# Patient Record
Sex: Female | Born: 1955
Health system: Southern US, Community
[De-identification: ages and names within clinical notes are randomized; demographics above are authoritative.]

## PROBLEM LIST (undated history)

## (undated) DIAGNOSIS — Z9889 Other specified postprocedural states: Secondary | ICD-10-CM

## (undated) DIAGNOSIS — T7840XA Allergy, unspecified, initial encounter: Secondary | ICD-10-CM

## (undated) DIAGNOSIS — R112 Nausea with vomiting, unspecified: Secondary | ICD-10-CM

## (undated) DIAGNOSIS — F419 Anxiety disorder, unspecified: Secondary | ICD-10-CM

## (undated) DIAGNOSIS — N3020 Other chronic cystitis without hematuria: Secondary | ICD-10-CM

## (undated) DIAGNOSIS — F329 Major depressive disorder, single episode, unspecified: Secondary | ICD-10-CM

## (undated) DIAGNOSIS — Z5189 Encounter for other specified aftercare: Secondary | ICD-10-CM

## (undated) DIAGNOSIS — E119 Type 2 diabetes mellitus without complications: Secondary | ICD-10-CM

## (undated) DIAGNOSIS — M1990 Unspecified osteoarthritis, unspecified site: Secondary | ICD-10-CM

## (undated) DIAGNOSIS — I10 Essential (primary) hypertension: Secondary | ICD-10-CM

## (undated) DIAGNOSIS — R7303 Prediabetes: Secondary | ICD-10-CM

## (undated) DIAGNOSIS — D649 Anemia, unspecified: Secondary | ICD-10-CM

## (undated) DIAGNOSIS — K259 Gastric ulcer, unspecified as acute or chronic, without hemorrhage or perforation: Secondary | ICD-10-CM

## (undated) DIAGNOSIS — Z860100 Personal history of colon polyps, unspecified: Secondary | ICD-10-CM

## (undated) DIAGNOSIS — G43909 Migraine, unspecified, not intractable, without status migrainosus: Secondary | ICD-10-CM

## (undated) DIAGNOSIS — E669 Obesity, unspecified: Secondary | ICD-10-CM

## (undated) DIAGNOSIS — J45909 Unspecified asthma, uncomplicated: Secondary | ICD-10-CM

## (undated) DIAGNOSIS — D749 Methemoglobinemia, unspecified: Secondary | ICD-10-CM

## (undated) DIAGNOSIS — K219 Gastro-esophageal reflux disease without esophagitis: Secondary | ICD-10-CM

## (undated) DIAGNOSIS — E785 Hyperlipidemia, unspecified: Secondary | ICD-10-CM

## (undated) DIAGNOSIS — F32A Depression, unspecified: Secondary | ICD-10-CM

## (undated) DIAGNOSIS — J189 Pneumonia, unspecified organism: Secondary | ICD-10-CM

## (undated) HISTORY — DX: Major depressive disorder, single episode, unspecified: F32.9

## (undated) HISTORY — DX: Essential (primary) hypertension: I10

## (undated) HISTORY — DX: Unspecified asthma, uncomplicated: J45.909

## (undated) HISTORY — DX: Gastro-esophageal reflux disease without esophagitis: K21.9

## (undated) HISTORY — DX: Personal history of colonic polyps: Z86.010

## (undated) HISTORY — DX: Unspecified osteoarthritis, unspecified site: M19.90

## (undated) HISTORY — DX: Personal history of colon polyps, unspecified: Z86.0100

## (undated) HISTORY — DX: Allergy, unspecified, initial encounter: T78.40XA

## (undated) HISTORY — DX: Other chronic cystitis without hematuria: N30.20

## (undated) HISTORY — DX: Obesity, unspecified: E66.9

## (undated) HISTORY — PX: NASAL SEPTUM SURGERY: SHX37

## (undated) HISTORY — DX: Methemoglobinemia, unspecified: D74.9

## (undated) HISTORY — PX: APPENDECTOMY: SHX54

## (undated) HISTORY — DX: Depression, unspecified: F32.A

## (undated) HISTORY — DX: Anxiety disorder, unspecified: F41.9

## (undated) HISTORY — PX: NECK SURGERY: SHX720

## (undated) HISTORY — PX: TONSILLECTOMY AND ADENOIDECTOMY: SUR1326

## (undated) HISTORY — DX: Anemia, unspecified: D64.9

## (undated) HISTORY — DX: Type 2 diabetes mellitus without complications: E11.9

## (undated) HISTORY — DX: Encounter for other specified aftercare: Z51.89

## (undated) HISTORY — PX: SPINE SURGERY: SHX786

## (undated) HISTORY — DX: Migraine, unspecified, not intractable, without status migrainosus: G43.909

## (undated) HISTORY — PX: SIGMOID RESECTION / RECTOPEXY: SUR1294

## (undated) HISTORY — PX: HIP ARTHROSCOPY: SHX668

## (undated) HISTORY — PX: COLON SURGERY: SHX602

## (undated) HISTORY — DX: Gastric ulcer, unspecified as acute or chronic, without hemorrhage or perforation: K25.9

## (undated) HISTORY — DX: Hyperlipidemia, unspecified: E78.5

---

## 1994-01-25 HISTORY — PX: ABDOMINAL HYSTERECTOMY: SHX81

## 1999-01-26 HISTORY — PX: BILATERAL OOPHORECTOMY: SHX1221

## 2004-03-23 ENCOUNTER — Ambulatory Visit: Payer: Self-pay | Admitting: Urology

## 2004-12-02 ENCOUNTER — Ambulatory Visit: Payer: Self-pay | Admitting: Otolaryngology

## 2006-02-25 ENCOUNTER — Ambulatory Visit: Payer: Self-pay | Admitting: Unknown Physician Specialty

## 2006-05-16 ENCOUNTER — Ambulatory Visit (HOSPITAL_COMMUNITY): Admission: RE | Admit: 2006-05-16 | Discharge: 2006-05-16 | Payer: Self-pay | Admitting: Neurosurgery

## 2006-07-13 ENCOUNTER — Encounter: Admission: RE | Admit: 2006-07-13 | Discharge: 2006-07-13 | Payer: Self-pay | Admitting: Neurosurgery

## 2006-12-12 ENCOUNTER — Ambulatory Visit: Payer: Self-pay | Admitting: Unknown Physician Specialty

## 2007-01-26 LAB — HM COLONOSCOPY

## 2007-04-24 ENCOUNTER — Ambulatory Visit: Payer: Self-pay | Admitting: Family Medicine

## 2007-05-03 ENCOUNTER — Ambulatory Visit: Payer: Self-pay | Admitting: Unknown Physician Specialty

## 2007-06-14 ENCOUNTER — Ambulatory Visit: Payer: Self-pay | Admitting: Otolaryngology

## 2009-11-04 ENCOUNTER — Ambulatory Visit: Payer: Self-pay | Admitting: Nurse Practitioner

## 2009-11-26 ENCOUNTER — Ambulatory Visit: Payer: Self-pay | Admitting: Family Medicine

## 2010-04-14 ENCOUNTER — Encounter: Payer: Self-pay | Admitting: Nurse Practitioner

## 2010-05-05 ENCOUNTER — Encounter (INDEPENDENT_AMBULATORY_CARE_PROVIDER_SITE_OTHER): Payer: 59 | Admitting: Nurse Practitioner

## 2010-05-05 DIAGNOSIS — G43109 Migraine with aura, not intractable, without status migrainosus: Secondary | ICD-10-CM

## 2010-05-06 NOTE — Assessment & Plan Note (Unsigned)
NAME:  Bethany Scott, Bethany Scott               ACCOUNT NO.:  [id number]  MEDICAL RECORD NO.:  [id number]           PATIENT TYPE:  LOCATION:  [facility] at [facility]           FACILITY:  PHYSICIAN:  Jaynie Collins, MD     DATE OF BIRTH:  Jul 28, 1955  DATE OF SERVICE:  05/05/2010                                 CLINIC NOTE  HISTORY OF PRESENT ILLNESS:  The patient comes to the office today for followup on her migraine headaches.  The patient was last seen 6 months ago at that time we did trigger point injections, refilled her medications, and she was doing relatively well.  She had 5 severe headaches and 5 moderate headaches at that time.  She has had Vicodin for headache rescue.  Since that time, she has also called in several times for refill on her Vicodin.  The patient has been forced to take Tylenol due to history of stomach ulcer with anti-inflammatories.  She is currently taking Dexilant for her stomach issues.  She is aware that Vicodin is addictive and does not want to have any issues with that medication and is willing to reformulate a plan.  Today, we talked about reformulating her headache management.  ALLERGIES:  PENICILLIN, CEPHALOSPORINS.  PHYSICAL EXAMINATION:  VITAL SIGNS:  Blood pressure is 133/91, pulse is 109, weight 240. GENERAL:  Well-developed, well-nourished 55 year old Caucasian female in no acute distress. HEENT:  Head is normocephalic and atraumatic.  Pupils are equal and reactive.  Her sclerae are somewhat reddened today secondary to allergies. NEUROLOGICALLY:  Alert and oriented.  Good coordination, good muscle tone, good sensation.  ASSESSMENT:  Migraine.  PLAN:  The patient does have stomach ulcers.  She is not going to be able to take anti-inflammatories.  We have decided to go up on her headache preventative which is the Effexor.  She was taking 150, will go up to 75 mg t.i.d.  She is given a prescription for #90 with 6 refills. She does have a lot of tension  in her neck.  We will try Zanaflex 4 mg at bedtime p.r.n. #30 with 3 refills and in lieu of the Vicodin, we will try Tylenol No. 3 one or two p.o. q.4 p.r.n. #30 with one refill.  The patient will follow up in 6 months or sooner.  She can certainly call the office any time.  She is having an issue and if this plan does not work, she is advised to call back.     Remonia Richter, NP   ______________________________ Jaynie Collins, MD   LR/MEDQ  D:  05/05/2010  T:  05/06/2010  Job:  [id number]

## 2010-06-09 NOTE — Assessment & Plan Note (Signed)
NAME:  Bethany Scott, Bethany Scott               ACCOUNT NO.:  [id number]   MEDICAL RECORD NO.:  [id number]          PATIENT TYPE:  POB   LOCATION:  [facility] at [facility]         FACILITY:  [facility]   PHYSICIAN:  Allie Bossier, MD        DATE OF BIRTH:  07-14-1955   DATE OF SERVICE:  11/04/2009                                  CLINIC NOTE   Patient comes to office today for a new consultation on migraine  headaches.  The patient has had migraine headaches for approximately the  last 17 years.  She does have an occasional migraines.  She is known to  [facility] from [facility].  Her headaches are mostly located  on her right or left temple.  In the last one month, she has had  approximately 5 severe and 5 moderate with no mild.  She is currently  taking Effexor, Maxalt, Phenergan, and Excedrin.  She had been seeing  her gynecologist to get her medications refilled, where gynecologist has  retired.   She has past medical history of asthma, elevated cholesterol, and  allergies.   SOCIAL HISTORY:  She is employed.  She is married with 2 children, one  married, one at home.  She does not smoke.  She drinks alcohol rarely.   Family history of diabetes and high blood pressure.   SYSTEMS REVIEW:  Positive for headache and easy bruising.  Negative for  weight gain or weight loss, depression, anxiety, cough, muscle bone  pain, abdominal pain, blood in stool or vaginal discharge.   PHYSICAL EXAMINATION:  GENERAL:  Well developed, well nourished 54-year-  old Caucasian female in no acute distress.  HEENT:  Head is normocephalic and atraumatic.  Pupils equal and react.  Patient is tender in her temples bilaterally as well as occipital  regions bilaterally.  CARDIAC:  Regular rate and rhythm.  LUNGS:  Clear bilaterally.   PROCEDURE:  Please see procedure note for trigger point injections.   ASSESSMENT:  Migraine headache.   PLAN:  We will do trigger point injections today per patient's request.  We will also refill her medications.  She has Effexor 150 mg 1 p.o.  daily #30 with 11 refills, Maxalt 10 mg 1 p.r.n. migraine #12 with  p.r.n. refills, Phenergan, #30 one q.6 hours p.r.n. #30 with 1 refill,  Norflex 100 mg 1 q.8 hours #30 with 3 refills.  She will rescue with  Vicodin ES one p.o. q.4 hours p.r.n. pain #30 with no refills.  She will  return to this clinic in 6 months or sooner as need be.      Bethany Richter, NP    ______________________________  Allie Bossier, MD    LR/MEDQ  D:  11/04/2009  T:  11/05/2009  Job:  [id number]

## 2010-06-10 ENCOUNTER — Ambulatory Visit: Payer: Self-pay | Admitting: Family Medicine

## 2010-06-12 NOTE — Op Note (Signed)
NAME:  Bethany Scott, Bethany Scott               ACCOUNT NO.:  [id number]   MEDICAL RECORD NO.:  [id number]          PATIENT TYPE:  AMB   LOCATION:  [facility]                          FACILITY:  [facility]   PHYSICIAN:  Henry A. Pool, M.D.    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   PROCEDURE NAME:  C4-5 and C5-6 anterior cervical diskectomy and fusion.   SURGEON:  Kathaleen Maser. Pool, M.D.   ASSISTANT:  Donalee Citrin, M.D.   ANESTHESIA:  General endotracheal.   INDICATIONS:  Bethany Scott is a 55 year old female with history of neck and  right upper extremity pain, paresthesias, and weakness, consistent with  a right C5 and C6 radiculopathy.  Workups demonstrated evidence of  significant spondylosis off of the right side of C4-5, as well as  rightward C5-6 spondylosis, with an element of a right-sided C5-6 disk  herniation.  The patient has failed conservative management and has  decided to proceed with a C4-5 and C5-6 anterior cervical diskectomy  fusion with allograft and plating.   OPERATIVE NOTE:  The patient was brought to the operating room and  placed on the operating table in a supine position.  After an adequate  level of anesthesia was achieved, the patient was positioned supine with  neck slightly extended and held in place with halter traction.  The  patient's anterior cervical region was prepped and draped sterilely.  A  10 blade was used to make a linear skin incision overlying the C5  vertebral level.  This was carried down sharply to the platysma.  The  platysma was then divided vertically, and dissection proceeded along the  medial border of the sternomastoid muscle and carotid sheath.  Trachea  and esophagus were mobilized and retracted  toward the left.  Prevertebral fascia was stripped off the anterior spinal column.  The  longus coli muscles were elevated bilaterally using electrocautery.  A  deep self-retaining retractor was placed.  Intraoperative fluoroscopy  was used, and the C4-5 and C5-6 levels were confirmed.  Disc space then  incised with a 15 blade in a rectangular fashion.  A wide disc space  cleanout was then achieved using pituitary rongeurs, forward and  backbiting curets, Kerrison rongeurs, and high-speed drill.  All  elements of the disc were removed down to the level of the posterior  annulus.  The microscope was brought into the field and used throughout  the remainder of the diskectomy.  Remaining aspects of the annulus and  osteophytes were removed using the high-speed drill down to the level of  the posterior longitudinal ligament.  The posterior longitudinal  ligament was then elevated and resected in a piecemeal fashion using  Kerrison rongeurs.  The underlying thecal sac was then identified.  A  wide sector decompression was then performed by undercutting the bodies  of C4 and C5.  Decompression then proceeded out into each neural  foramen.  Wide anterior foraminotomies were then performed along the  course of the exiting C5 nerve roots bilaterally.  After a very thorough  irrigation decompression had been achieved, the thecal sac and nerve  roots were inspected and found to be free from injury.  The procedure  was then repeated at C5-6, again without complication.  Findings at this  level included a significant rightward C5-6 disc herniation.  The wound  was then irrigated with antibiotic solution.  Gelfoam was placed  topically for hemostasis, which was found to be good.  A 6 mm LifeNet  allograft wedge was then packed into place and recessed approximately 1  mm from the anterior cortical margin at C4-5 and C5-6.  A 42 mm Atlantis  anterior cervical plate was then placed over the C4, C5 and C6  levels.  This was then attached under fluoroscopic guidance using 13 mm variable  long screws, two each at all three levels.  Locking screws were engaged  at all three levels.  Final images revealed good position of the bone  grafts, hardware __________  spine.  Was then irrigated one final time.  Hemostasis was ensured with bipolar cautery.  The wound was then closed  in the typical fashion.  Steri-Strips and sterile dressings were  applied.  There were complications.  The patient tolerated the procedure  well, and she returns to the recovery room for postoperative care.           ______________________________  Kathaleen Maser. Pool, M.D.     HAP/MEDQ  D:  05/16/2006  T:  05/16/2006  Job:  [id number]

## 2010-07-08 ENCOUNTER — Ambulatory Visit: Payer: Self-pay | Admitting: Family Medicine

## 2010-09-16 ENCOUNTER — Other Ambulatory Visit: Payer: Self-pay | Admitting: Gynecology

## 2010-09-16 ENCOUNTER — Telehealth: Payer: Self-pay

## 2010-09-16 MED ORDER — ACETAMINOPHEN-CODEINE 300-30 MG PO TABS: 1.0000 | ORAL_TABLET | ORAL | Status: AC | PRN

## 2010-09-16 NOTE — Telephone Encounter (Signed)
Patient needs Tylenol N0.3 one or two p.o.q. 4 p.r.n # 30 w/ one refill. Bonita Quin approved to be called in to [facility] in [place].

## 2010-12-23 ENCOUNTER — Ambulatory Visit: Payer: Self-pay | Admitting: Family Medicine

## 2011-05-18 ENCOUNTER — Ambulatory Visit: Payer: Self-pay | Admitting: Family Medicine

## 2011-05-26 ENCOUNTER — Ambulatory Visit: Payer: Self-pay | Admitting: Family Medicine

## 2011-06-08 ENCOUNTER — Ambulatory Visit: Payer: Self-pay

## 2011-06-26 ENCOUNTER — Ambulatory Visit: Payer: Self-pay | Admitting: Family Medicine

## 2011-10-28 ENCOUNTER — Ambulatory Visit: Payer: No Typology Code available for payment source

## 2011-10-28 ENCOUNTER — Ambulatory Visit (INDEPENDENT_AMBULATORY_CARE_PROVIDER_SITE_OTHER): Payer: No Typology Code available for payment source | Admitting: Family Medicine

## 2011-10-28 VITALS — BP 146/82 | HR 83 | Temp 98.3°F | Resp 16 | Ht 67.0 in | Wt 247.0 lb

## 2011-10-28 DIAGNOSIS — F419 Anxiety disorder, unspecified: Secondary | ICD-10-CM

## 2011-10-28 DIAGNOSIS — M2550 Pain in unspecified joint: Secondary | ICD-10-CM

## 2011-10-28 DIAGNOSIS — M545 Low back pain, unspecified: Secondary | ICD-10-CM

## 2011-10-28 DIAGNOSIS — M2540 Effusion, unspecified joint: Secondary | ICD-10-CM

## 2011-10-28 DIAGNOSIS — R109 Unspecified abdominal pain: Secondary | ICD-10-CM

## 2011-10-28 DIAGNOSIS — F411 Generalized anxiety disorder: Secondary | ICD-10-CM

## 2011-10-28 LAB — POCT URINALYSIS DIPSTICK
Glucose, UA: NEGATIVE
Protein, UA: NEGATIVE
Urobilinogen, UA: 0.2

## 2011-10-28 LAB — POCT CBC
MCH, POC: 27.2 pg (ref 27–31.2)
MID (cbc): 0.6 (ref 0–0.9)
MPV: 7.6 fL (ref 0–99.8)
POC Granulocyte: 4.4 (ref 2–6.9)
POC MID %: 7.1 %M (ref 0–12)
Platelet Count, POC: 337 10*3/uL (ref 142–424)
RBC: 5.18 M/uL (ref 4.04–5.48)
WBC: 8.1 10*3/uL (ref 4.6–10.2)

## 2011-10-28 LAB — POCT UA - MICROSCOPIC ONLY
Casts, Ur, LPF, POC: NEGATIVE
Crystals, Ur, HPF, POC: NEGATIVE

## 2011-10-28 LAB — POCT SEDIMENTATION RATE: POCT SED RATE: 27 mm/hr — AB (ref 0–22)

## 2011-10-28 MED ORDER — ALPRAZOLAM 0.5 MG PO TABS: ORAL_TABLET | ORAL | Status: DC

## 2011-10-28 MED ORDER — HYDROCODONE-ACETAMINOPHEN 5-325 MG PO TABS: 1.0000 | ORAL_TABLET | Freq: Four times a day (QID) | ORAL | Status: DC | PRN

## 2011-10-28 MED ORDER — METAXALONE 800 MG PO TABS: 800.0000 mg | ORAL_TABLET | Freq: Three times a day (TID) | ORAL | Status: DC

## 2011-10-28 MED ORDER — MELOXICAM 15 MG PO TABS: 15.0000 mg | ORAL_TABLET | Freq: Every day | ORAL | Status: DC

## 2011-10-28 NOTE — Patient Instructions (Signed)
1. Low back pain  POCT CBC, POCT SEDIMENTATION RATE, POCT UA - Microscopic Only, POCT urinalysis dipstick, Urine culture, Rheumatoid factor, ANA, DG Lumbar Spine Complete, meloxicam (MOBIC) 15 MG tablet, metaxalone (SKELAXIN) 800 MG tablet, HYDROcodone-acetaminophen (NORCO/VICODIN) 5-325 MG per tablet  2. Joint pain  POCT CBC, POCT SEDIMENTATION RATE, POCT UA - Microscopic Only, POCT urinalysis dipstick, Urine culture, Rheumatoid factor, ANA, DG Lumbar Spine Complete  3. Joint swelling  POCT CBC, POCT SEDIMENTATION RATE, POCT UA - Microscopic Only, POCT urinalysis dipstick, Urine culture, Rheumatoid factor, ANA, DG Lumbar Spine Complete  4. Abdominal  pain, other specified site  POCT CBC, POCT SEDIMENTATION RATE, POCT UA - Microscopic Only, POCT urinalysis dipstick, Urine culture, Rheumatoid factor, ANA, DG Lumbar Spine Complete  5. Anxiety  ALPRAZolam (XANAX) 0.5 MG tablet   Low Back Sprain with Rehab  A sprain is an injury in which a ligament is torn. The ligaments of the lower back are vulnerable to sprains. However, they are strong and require great force to be injured. These ligaments are important for stabilizing the spinal column. Sprains are classified into three categories. Grade 1 sprains cause pain, but the tendon is not lengthened. Grade 2 sprains include a lengthened ligament, due to the ligament being stretched or partially ruptured. With grade 2 sprains there is still function, although the function may be decreased. Grade 3 sprains involve a complete tear of the tendon or muscle, and function is usually impaired. SYMPTOMS   Severe pain in the lower back.  Sometimes, a feeling of a "pop," "snap," or tear, at the time of injury.  Tenderness and sometimes swelling at the injury site.  Uncommonly, bruising (contusion) within 48 hours of injury.  Muscle spasms in the back. CAUSES  Low back sprains occur when a force is placed on the ligaments that is greater than they can handle.  Common causes of injury include:  Performing a stressful act while off-balance.  Repetitive stressful activities that involve movement of the lower back.  Direct hit (trauma) to the lower back. RISK INCREASES WITH:  Contact sports (football, wrestling).  Collisions (major skiing accidents).  Sports that require throwing or lifting (baseball, weightlifting).  Sports involving twisting of the spine (gymnastics, diving, tennis, golf).  Poor strength and flexibility.  Inadequate protection.  Previous back injury or surgery (especially fusion). PREVENTION  Wear properly fitted and padded protective equipment.  Warm up and stretch properly before activity.  Allow for adequate recovery between workouts.  Maintain physical fitness:  Strength, flexibility, and endurance.  Cardiovascular fitness.  Maintain a healthy body weight. PROGNOSIS  If treated properly, low back sprains usually heal with non-surgical treatment. The length of time for healing depends on the severity of the injury.  RELATED COMPLICATIONS   Recurring symptoms, resulting in a chronic problem.  Chronic inflammation and pain in the low back.  Delayed healing or resolution of symptoms, especially if activity is resumed too soon.  Prolonged impairment.  Unstable or arthritic joints of the low back. TREATMENT  Treatment first involves the use of ice and medicine, to reduce pain and inflammation. The use of strengthening and stretching exercises may help reduce pain with activity. These exercises may be performed at home or with a therapist. Severe injuries may require referral to a therapist for further evaluation and treatment, such as ultrasound. Your caregiver may advise that you wear a back brace or corset, to help reduce pain and discomfort. Often, prolonged bed rest results in greater harm then benefit. Corticosteroid  injections may be recommended. However, these should be reserved for the most serious  cases. It is important to avoid using your back when lifting objects. At night, sleep on your back on a firm mattress, with a pillow placed under your knees. If non-surgical treatment is unsuccessful, surgery may be needed.  MEDICATION   If pain medicine is needed, nonsteroidal anti-inflammatory medicines (aspirin and ibuprofen), or other minor pain relievers (acetaminophen), are often advised.  Do not take pain medicine for 7 days before surgery.  Prescription pain relievers may be given, if your caregiver thinks they are needed. Use only as directed and only as much as you need.  Ointments applied to the skin may be helpful.  Corticosteroid injections may be given by your caregiver. These injections should be reserved for the most serious cases, because they may only be given a certain number of times. HEAT AND COLD  Cold treatment (icing) should be applied for 10 to 15 minutes every 2 to 3 hours for inflammation and pain, and immediately after activity that aggravates your symptoms. Use ice packs or an ice massage.  Heat treatment may be used before performing stretching and strengthening activities prescribed by your caregiver, physical therapist, or athletic trainer. Use a heat pack or a warm water soak. SEEK MEDICAL CARE IF:   Symptoms get worse or do not improve in 2 to 4 weeks, despite treatment.  You develop numbness or weakness in either leg.  You lose bowel or bladder function.  Any of the following occur after surgery: fever, increased pain, swelling, redness, drainage of fluids, or bleeding in the affected area.  New, unexplained symptoms develop. (Drugs used in treatment may produce side effects.) EXERCISES  RANGE OF MOTION (ROM) AND STRETCHING EXERCISES - Low Back Sprain Most people with lower back pain will find that their symptoms get worse with excessive bending forward (flexion) or arching at the lower back (extension). The exercises that will help resolve your  symptoms will focus on the opposite motion.  Your physician, physical therapist or athletic trainer will help you determine which exercises will be most helpful to resolve your lower back pain. Do not complete any exercises without first consulting with your caregiver. Discontinue any exercises which make your symptoms worse, until you speak to your caregiver. If you have pain, numbness or tingling which travels down into your buttocks, leg or foot, the goal of the therapy is for these symptoms to move closer to your back and eventually resolve. Sometimes, these leg symptoms will get better, but your lower back pain may worsen. This is often an indication of progress in your rehabilitation. Be very alert to any changes in your symptoms and the activities in which you participated in the 24 hours prior to the change. Sharing this information with your caregiver will allow him or her to most efficiently treat your condition. These exercises may help you when beginning to rehabilitate your injury. Your symptoms may resolve with or without further involvement from your physician, physical therapist or athletic trainer. While completing these exercises, remember:   Restoring tissue flexibility helps normal motion to return to the joints. This allows healthier, less painful movement and activity.  An effective stretch should be held for at least 30 seconds.  A stretch should never be painful. You should only feel a gentle lengthening or release in the stretched tissue. FLEXION RANGE OF MOTION AND STRETCHING EXERCISES: STRETCH  Flexion, Single Knee to Chest   Lie on a firm bed or  floor with both legs extended in front of you.  Keeping one leg in contact with the floor, bring your opposite knee to your chest. Hold your leg in place by either grabbing behind your thigh or at your knee.  Pull until you feel a gentle stretch in your low back. Hold __________ seconds.  Slowly release your grasp and repeat the  exercise with the opposite side. Repeat __________ times. Complete this exercise __________ times per day.  STRETCH  Flexion, Double Knee to Chest  Lie on a firm bed or floor with both legs extended in front of you.  Keeping one leg in contact with the floor, bring your opposite knee to your chest.  Tense your stomach muscles to support your back and then lift your other knee to your chest. Hold your legs in place by either grabbing behind your thighs or at your knees.  Pull both knees toward your chest until you feel a gentle stretch in your low back. Hold __________ seconds.  Tense your stomach muscles and slowly return one leg at a time to the floor. Repeat __________ times. Complete this exercise __________ times per day.  STRETCH  Low Trunk Rotation  Lie on a firm bed or floor. Keeping your legs in front of you, bend your knees so they are both pointed toward the ceiling and your feet are flat on the floor.  Extend your arms out to the side. This will stabilize your upper body by keeping your shoulders in contact with the floor.  Gently and slowly drop both knees together to one side until you feel a gentle stretch in your low back. Hold for __________ seconds.  Tense your stomach muscles to support your lower back as you bring your knees back to the starting position. Repeat the exercise to the other side. Repeat __________ times. Complete this exercise __________ times per day  EXTENSION RANGE OF MOTION AND FLEXIBILITY EXERCISES: STRETCH  Extension, Prone on Elbows   Lie on your stomach on the floor, a bed will be too soft. Place your palms about shoulder width apart and at the height of your head.  Place your elbows under your shoulders. If this is too painful, stack pillows under your chest.  Allow your body to relax so that your hips drop lower and make contact more completely with the floor.  Hold this position for __________ seconds.  Slowly return to lying flat on the  floor. Repeat __________ times. Complete this exercise __________ times per day.  RANGE OF MOTION  Extension, Prone Press Ups  Lie on your stomach on the floor, a bed will be too soft. Place your palms about shoulder width apart and at the height of your head.  Keeping your back as relaxed as possible, slowly straighten your elbows while keeping your hips on the floor. You may adjust the placement of your hands to maximize your comfort. As you gain motion, your hands will come more underneath your shoulders.  Hold this position __________ seconds.  Slowly return to lying flat on the floor. Repeat __________ times. Complete this exercise __________ times per day.  RANGE OF MOTION- Quadruped, Neutral Spine   Assume a hands and knees position on a firm surface. Keep your hands under your shoulders and your knees under your hips. You may place padding under your knees for comfort.  Drop your head and point your tailbone toward the ground below you. This will round out your lower back like an angry cat. Hold  this position for __________ seconds.  Slowly lift your head and release your tail bone so that your back sags into a large arch, like an old horse.  Hold this position for __________ seconds.  Repeat this until you feel limber in your low back.  Now, find your "sweet spot." This will be the most comfortable position somewhere between the two previous positions. This is your neutral spine. Once you have found this position, tense your stomach muscles to support your low back.  Hold this position for __________ seconds. Repeat __________ times. Complete this exercise __________ times per day.  STRENGTHENING EXERCISES - Low Back Sprain These exercises may help you when beginning to rehabilitate your injury. These exercises should be done near your "sweet spot." This is the neutral, low-back arch, somewhere between fully rounded and fully arched, that is your least painful position. When  performed in this safe range of motion, these exercises can be used for people who have either a flexion or extension based injury. These exercises may resolve your symptoms with or without further involvement from your physician, physical therapist or athletic trainer. While completing these exercises, remember:   Muscles can gain both the endurance and the strength needed for everyday activities through controlled exercises.  Complete these exercises as instructed by your physician, physical therapist or athletic trainer. Increase the resistance and repetitions only as guided.  You may experience muscle soreness or fatigue, but the pain or discomfort you are trying to eliminate should never worsen during these exercises. If this pain does worsen, stop and make certain you are following the directions exactly. If the pain is still present after adjustments, discontinue the exercise until you can discuss the trouble with your caregiver. STRENGTHENING Deep Abdominals, Pelvic Tilt   Lie on a firm bed or floor. Keeping your legs in front of you, bend your knees so they are both pointed toward the ceiling and your feet are flat on the floor.  Tense your lower abdominal muscles to press your low back into the floor. This motion will rotate your pelvis so that your tail bone is scooping upwards rather than pointing at your feet or into the floor. With a gentle tension and even breathing, hold this position for __________ seconds. Repeat __________ times. Complete this exercise __________ times per day.  STRENGTHENING  Abdominals, Crunches   Lie on a firm bed or floor. Keeping your legs in front of you, bend your knees so they are both pointed toward the ceiling and your feet are flat on the floor. Cross your arms over your chest.  Slightly tip your chin down without bending your neck.  Tense your abdominals and slowly lift your trunk high enough to just clear your shoulder blades. Lifting higher can  put excessive stress on the lower back and does not further strengthen your abdominal muscles.  Control your return to the starting position. Repeat __________ times. Complete this exercise __________ times per day.  STRENGTHENING  Quadruped, Opposite UE/LE Lift   Assume a hands and knees position on a firm surface. Keep your hands under your shoulders and your knees under your hips. You may place padding under your knees for comfort.  Find your neutral spine and gently tense your abdominal muscles so that you can maintain this position. Your shoulders and hips should form a rectangle that is parallel with the floor and is not twisted.  Keeping your trunk steady, lift your right hand no higher than your shoulder and then your  left leg no higher than your hip. Make sure you are not holding your breath. Hold this position for __________ seconds.  Continuing to keep your abdominal muscles tense and your back steady, slowly return to your starting position. Repeat with the opposite arm and leg. Repeat __________ times. Complete this exercise __________ times per day.  STRENGTHENING  Abdominals and Quadriceps, Straight Leg Raise   Lie on a firm bed or floor with both legs extended in front of you.  Keeping one leg in contact with the floor, bend the other knee so that your foot can rest flat on the floor.  Find your neutral spine, and tense your abdominal muscles to maintain your spinal position throughout the exercise.  Slowly lift your straight leg off the floor about 6 inches for a count of 15, making sure to not hold your breath.  Still keeping your neutral spine, slowly lower your leg all the way to the floor. Repeat this exercise with each leg __________ times. Complete this exercise __________ times per day. POSTURE AND BODY MECHANICS CONSIDERATIONS - Low Back Sprain Keeping correct posture when sitting, standing or completing your activities will reduce the stress put on different body  tissues, allowing injured tissues a chance to heal and limiting painful experiences. The following are general guidelines for improved posture. Your physician or physical therapist will provide you with any instructions specific to your needs. While reading these guidelines, remember:  The exercises prescribed by your provider will help you have the flexibility and strength to maintain correct postures.  The correct posture provides the best environment for your joints to work. All of your joints have less wear and tear when properly supported by a spine with good posture. This means you will experience a healthier, less painful body.  Correct posture must be practiced with all of your activities, especially prolonged sitting and standing. Correct posture is as important when doing repetitive low-stress activities (typing) as it is when doing a single heavy-load activity (lifting). RESTING POSITIONS Consider which positions are most painful for you when choosing a resting position. If you have pain with flexion-based activities (sitting, bending, stooping, squatting), choose a position that allows you to rest in a less flexed posture. You would want to avoid curling into a fetal position on your side. If your pain worsens with extension-based activities (prolonged standing, working overhead), avoid resting in an extended position such as sleeping on your stomach. Most people will find more comfort when they rest with their spine in a more neutral position, neither too rounded nor too arched. Lying on a non-sagging bed on your side with a pillow between your knees, or on your back with a pillow under your knees will often provide some relief. Keep in mind, being in any one position for a prolonged period of time, no matter how correct your posture, can still lead to stiffness. PROPER SITTING POSTURE In order to minimize stress and discomfort on your spine, you must sit with correct posture. Sitting with  good posture should be effortless for a healthy body. Returning to good posture is a gradual process. Many people can work toward this most comfortably by using various supports until they have the flexibility and strength to maintain this posture on their own. When sitting with proper posture, your ears will fall over your shoulders and your shoulders will fall over your hips. You should use the back of the chair to support your upper back. Your lower back will be in a  neutral position, just slightly arched. You may place a small pillow or folded towel at the base of your lower back for  support.  When working at a desk, create an environment that supports good, upright posture. Without extra support, muscles tire, which leads to excessive strain on joints and other tissues. Keep these recommendations in mind: CHAIR:  A chair should be able to slide under your desk when your back makes contact with the back of the chair. This allows you to work closely.  The chair's height should allow your eyes to be level with the upper part of your monitor and your hands to be slightly lower than your elbows. BODY POSITION  Your feet should make contact with the floor. If this is not possible, use a foot rest.  Keep your ears over your shoulders. This will reduce stress on your neck and low back. INCORRECT SITTING POSTURES  If you are feeling tired and unable to assume a healthy sitting posture, do not slouch or slump. This puts excessive strain on your back tissues, causing more damage and pain. Healthier options include:  Using more support, like a lumbar pillow.  Switching tasks to something that requires you to be upright or walking.  Talking a brief walk.  Lying down to rest in a neutral-spine position. PROLONGED STANDING WHILE SLIGHTLY LEANING FORWARD  When completing a task that requires you to lean forward while standing in one place for a long time, place either foot up on a stationary 2-4 inch  high object to help maintain the best posture. When both feet are on the ground, the lower back tends to lose its slight inward curve. If this curve flattens (or becomes too large), then the back and your other joints will experience too much stress, tire more quickly, and can cause pain. CORRECT STANDING POSTURES Proper standing posture should be assumed with all daily activities, even if they only take a few moments, like when brushing your teeth. As in sitting, your ears should fall over your shoulders and your shoulders should fall over your hips. You should keep a slight tension in your abdominal muscles to brace your spine. Your tailbone should point down to the ground, not behind your body, resulting in an over-extended swayback posture.  INCORRECT STANDING POSTURES  Common incorrect standing postures include a forward head, locked knees and/or an excessive swayback. WALKING Walk with an upright posture. Your ears, shoulders and hips should all line-up. PROLONGED ACTIVITY IN A FLEXED POSITION When completing a task that requires you to bend forward at your waist or lean over a low surface, try to find a way to stabilize 3 out of 4 of your limbs. You can place a hand or elbow on your thigh or rest a knee on the surface you are reaching across. This will provide you more stability, so that your muscles do not tire as quickly. By keeping your knees relaxed, or slightly bent, you will also reduce stress across your lower back. CORRECT LIFTING TECHNIQUES DO :  Assume a wide stance. This will provide you more stability and the opportunity to get as close as possible to the object which you are lifting.  Tense your abdominals to brace your spine. Bend at the knees and hips. Keeping your back locked in a neutral-spine position, lift using your leg muscles. Lift with your legs, keeping your back straight.  Test the weight of unknown objects before attempting to lift them.  Try to keep your elbows  locked down at your sides in order get the best strength from your shoulders when carrying an object.  Always ask for help when lifting heavy or awkward objects. INCORRECT LIFTING TECHNIQUES DO NOT:   Lock your knees when lifting, even if it is a small object.  Bend and twist. Pivot at your feet or move your feet when needing to change directions.  Assume that you can safely pick up even a paperclip without proper posture. Document Released: 01/11/2005 Document Revised: 04/05/2011 Document Reviewed: 04/25/2008 [facility] Patient Information 2013 [place], [place].

## 2011-10-28 NOTE — Progress Notes (Signed)
[address]   [place], [place]  [address]   [phone]  Subjective:    Patient ID: Bethany Scott, female    DOB: 1955-04-28, 56 y.o.   MRN: [id number]  HPIThis 56 y.o. female presents for evaluation of lower back pain.  A few weeks ago, follow-up with Bethany Scott; placed on Levaquin.  Had been on Biaxin per Bethany Scott.  Unable to see Bethany Scott; unable to get into [facility].  Then short staffed; so Bethany Scott called in more Levaquin.  Thought low back pain was due to Levaquin.  Then also had lower abdominal and low back pain; also having joint pains.  Concern regarding RA.  Bethany Scott suggested psoriatic arthritis.  Does notice that is very painful along distal fingers when having Scott done.  Low back pain is pressure dull pain.  Also having sharp pain.  Getting up and down all day at work causes worsening pain.  Not sleeping well due to pain.  No hematuria; +dysuria initially; urine has cold feeling to it.  At nighttime, feel lower pelvic pain.  Lower back pain B; radiates into pelvic region; no radiation into legs.  Constant pain; no relief.  Tiring pain.  No n/t/w.  Very stiff every morning.  Feet and legs very stiff.  Tried taking low dose of ASA; cannot take Ibuprofen.  Did take Meloxicam daily x 6 days without relief.  Normal b/b function.  No saddle paresthesias.  No Levaquin for a few days.  Knees hurt; toes hurt.  No family history of RA, SLE.  Most family maternal lung disease.  Paternal side DMII.    Has been lifting newborn granddaughter a lot in past several weeks.  2. Anxiety: stable; needs refill on Xanax if possible.  Work stressors and home stressors continue. No SI/HI.   Review of Systems  Constitutional: Positive for fatigue. Negative for fever, chills and diaphoresis.  HENT: Positive for congestion, rhinorrhea, voice change and postnasal drip. Negative for sore throat, dental problem and sinus pressure.   Eyes: Negative for photophobia and visual disturbance.  Cardiovascular: Negative for  chest pain, palpitations and leg swelling.  Gastrointestinal: Negative for nausea, vomiting, abdominal pain, diarrhea, constipation and abdominal distention.  Genitourinary: Positive for dysuria, frequency and pelvic pain. Negative for urgency, hematuria, flank pain, vaginal discharge and vaginal pain.  Musculoskeletal: Positive for myalgias, back pain, joint swelling and arthralgias. Negative for gait problem.  Neurological: Negative for weakness and numbness.    Past Medical History  Diagnosis Date  . Allergy   . Anxiety   . Asthma   . Depression   . Diabetes mellitus without complication   . GERD (gastroesophageal reflux disease)   . Hyperlipidemia   . Migraine     No past surgical history on file.  Prior to Admission medications   Medication Sig Start Date End Date Taking? Authorizing Provider  Azelastine-Fluticasone (DYMISTA) 137-50 MCG/ACT SUSP Place into the nose.   Yes Historical Provider, MD  cetirizine (ZYRTEC) 10 MG tablet Take 10 mg by mouth daily.   Yes Historical Provider, MD  cholecalciferol (VITAMIN D) 1000 UNITS tablet Take 1,000 Units by mouth daily.   Yes Historical Provider, MD  ferrous sulfate 325 (65 FE) MG tablet Take 325 mg by mouth daily with breakfast.   Yes Historical Provider, MD  Fluticasone-Salmeterol (ADVAIR DISKUS) 100-50 MCG/DOSE AEPB Inhale 1 puff into the lungs every 12 (twelve) hours.   Yes Historical Provider, MD  HYDROcodone-acetaminophen (NORCO/VICODIN) 5-325 MG per tablet Take 1-2 tablets by  mouth every 6 (six) hours as needed. 10/28/11  Yes Ethelda Chick, MD  montelukast (SINGULAIR) 10 MG tablet Take 10 mg by mouth at bedtime.   Yes Historical Provider, MD  Multiple Vitamin (MULTIVITAMIN) capsule Take 1 capsule by mouth daily.   Yes Historical Provider, MD  promethazine (PHENERGAN) 25 MG tablet Take 25 mg by mouth as needed.   Yes Historical Provider, MD  rizatriptan (MAXALT) 10 MG tablet Take 10 mg by mouth as needed. May repeat in 2 hours if  needed   Yes Historical Provider, MD  venlafaxine XR (EFFEXOR-XR) 150 MG 24 hr capsule Take 150 mg by mouth daily.   Yes Historical Provider, MD  ALPRAZolam Prudy Feeler) 0.5 MG tablet One tablet daily PRN anxiety 10/28/11   Ethelda Chick, MD  baclofen (LIORESAL) 10 MG tablet Take 1 tablet (10 mg total) by mouth 2 (two) times daily. 10/30/11   Heather Jaquita Rector, PA-C  meloxicam (MOBIC) 15 MG tablet Take 1 tablet (15 mg total) by mouth daily. 10/28/11   Ethelda Chick, MD  metaxalone (SKELAXIN) 800 MG tablet Take 1 tablet (800 mg total) by mouth 3 (three) times daily. 10/28/11   Ethelda Chick, MD    Allergies  Allergen Reactions  . Cephalosporins Anaphylaxis  . Penicillins Anaphylaxis    History   Social History  . Marital Status: Married    Spouse Name: N/A    Number of Children: N/A  . Years of Education: N/A   Occupational History  . Not on file.   Social History Main Topics  . Smoking status: Former Games developer  . Smokeless tobacco: Never Used  . Alcohol Use: No  . Drug Use: No  . Sexually Active: Yes   Other Topics Concern  . Not on file   Social History Narrative   Marital status: married; happily married; no abuse.   Children: 2 sons   Lives: with husband.   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   Tobacco: former smoker   Alcohol: none   Drugs: none   Exercise: none    No family history on file.     Objective:   Physical Exam  Nursing note and vitals reviewed. Constitutional: She is oriented to person, place, and time. She appears well-developed and well-nourished. No distress.  HENT:  Head: Normocephalic and atraumatic.  Right Ear: External ear normal.  Left Ear: External ear normal.  Nose: Nose normal.  Mouth/Throat: Oropharynx is clear and moist.  Eyes: Conjunctivae normal are normal. Pupils are equal, round, and reactive to light.  Neck: Normal range of motion. Neck supple. No thyromegaly present.  Cardiovascular: Normal rate, regular  rhythm, normal heart sounds and intact distal pulses.   No murmur heard. Pulmonary/Chest: Effort normal and breath sounds normal. No respiratory distress. She has no wheezes. She has no rales.  Abdominal: Soft. Bowel sounds are normal. She exhibits no distension and no mass. There is no tenderness. There is no rebound and no guarding.  Musculoskeletal:       Lumbar back: She exhibits decreased range of motion, tenderness, pain and spasm. She exhibits no bony tenderness, no swelling, no edema and normal pulse.       LUMBAR SPINE: DECREASED ROM DUE TO PAIN; PAIN WITH FLEXION, EXTENSION, ROTATION SIDE TO SIDE.  STRAIGHT LEG RAISES NEGATIVE; MOTOR 5/5 BLE.  TOE AND HEEL WALKING INTACT.  MARCHING INTACT.  Lymphadenopathy:    She has no cervical adenopathy.  Neurological: She is alert and oriented  to person, place, and time. No cranial nerve deficit or sensory deficit. She exhibits normal muscle tone.  Skin: Skin is warm and dry. She is not diaphoretic.  Psychiatric: She has a normal mood and affect. Her behavior is normal. Judgment and thought content normal.     Results for orders placed in visit on 10/28/11  POCT CBC      Component Value Range   WBC 8.1  4.6 - 10.2 K/uL   Lymph, poc 3.1  0.6 - 3.4   POC LYMPH PERCENT 38.8  10 - 50 %L   MID (cbc) 0.6  0 - 0.9   POC MID % 7.1  0 - 12 %M   POC Granulocyte 4.4  2 - 6.9   Granulocyte percent 54.1  37 - 80 %G   RBC 5.18  4.04 - 5.48 M/uL   Hemoglobin 14.1  12.2 - 16.2 g/dL   HCT, POC 16.1  09.6 - 47.9 %   MCV 86.6  80 - 97 fL   MCH, POC 27.2  27 - 31.2 pg   MCHC 31.4 (*) 31.8 - 35.4 g/dL   RDW, POC 04.5     Platelet Count, POC 337  142 - 424 K/uL   MPV 7.6  0 - 99.8 fL  POCT UA - MICROSCOPIC ONLY      Component Value Range   WBC, Ur, HPF, POC 1-4     RBC, urine, microscopic 6-11     Bacteria, U Microscopic 1+     Mucus, UA pos     Epithelial cells, urine per micros 1-3     Crystals, Ur, HPF, POC neg     Casts, Ur, LPF, POC neg      Yeast, UA neg    POCT URINALYSIS DIPSTICK      Component Value Range   Color, UA yellow     Clarity, UA clear     Glucose, UA neg     Bilirubin, UA neg     Ketones, UA neg     Spec Grav, UA 1.015     Blood, UA trace     pH, UA 6.0     Protein, UA neg     Urobilinogen, UA 0.2     Nitrite, UA neg     Leukocytes, UA Trace     [facility] reading (PRIMARY) by  Dr. Katrinka Blazing.  LS films: degenerative changes with spurring.     Assessment & Plan:   1. Low back pain  POCT CBC, POCT SEDIMENTATION RATE, POCT UA - Microscopic Only, POCT urinalysis dipstick, Urine culture, Rheumatoid factor, ANA, DG Lumbar Spine Complete, meloxicam (MOBIC) 15 MG tablet, metaxalone (SKELAXIN) 800 MG tablet, HYDROcodone-acetaminophen (NORCO/VICODIN) 5-325 MG per tablet  2. Joint pain  POCT CBC, POCT SEDIMENTATION RATE, POCT UA - Microscopic Only, POCT urinalysis dipstick, Urine culture, Rheumatoid factor, ANA, DG Lumbar Spine Complete  3. Joint swelling  POCT CBC, POCT SEDIMENTATION RATE, POCT UA - Microscopic Only, POCT urinalysis dipstick, Urine culture, Rheumatoid factor, ANA, DG Lumbar Spine Complete  4. Abdominal  pain, other specified site  POCT CBC, POCT SEDIMENTATION RATE, POCT UA - Microscopic Only, POCT urinalysis dipstick, Urine culture, Rheumatoid factor, ANA, DG Lumbar Spine Complete  5. Anxiety  ALPRAZolam (XANAX) 0.5 MG tablet     1. Low back pain/Lumbar strain:  New.  Rx for Mobic, Skelaxin, Vicodin provided. Home exercises provided to perform daily.  Avoid lifting > 10 Scott for next 2-3 weeks.  Avoid repetitive bending, twisting,  rotating.  To call in one month if pain persists and will refer to ortho.   2.  Arthralgias: New.  Onset after Levaquin use yet warrants labs to exclude autoimmune process. Obtain autoimmune labs including ESR, ANA, RF.  Supportive care with rest, stretching, Mobic, Skelaxin.  If persists or worsens, will warrant referral to rheumatology. 3.  Abdominal pain/Pelvic pain: New.   Associated with lower back pain.  Obtain urine culture.  No associated GI symptoms. 4.  Anxiety: stable but persistent stressors; refill of Xanax provided.

## 2011-10-29 ENCOUNTER — Telehealth: Payer: Self-pay | Admitting: Radiology

## 2011-10-29 NOTE — Telephone Encounter (Signed)
Patient states Rx for Skelaxin was $150, this is generic now. Please advise if there is an alternative medication we can change this to? [facility]

## 2011-10-29 NOTE — Telephone Encounter (Signed)
I can send in a prescription for baclofen which should be less expensive.  Has she used baclofen before?  If so, has she tolerated it well?

## 2011-10-29 NOTE — Telephone Encounter (Signed)
Left message for patient to call me back about this. Baclofen 10mg / bid will send this in for her if she is agreeable.

## 2011-10-30 LAB — URINE CULTURE: Colony Count: NO GROWTH

## 2011-10-30 MED ORDER — BACLOFEN 10 MG PO TABS: 10.0000 mg | ORAL_TABLET | Freq: Two times a day (BID) | ORAL | Status: DC

## 2011-10-30 NOTE — Telephone Encounter (Signed)
Rx done and sent to pharmacy 

## 2011-10-30 NOTE — Telephone Encounter (Signed)
Patient will take baclofen

## 2011-10-30 NOTE — Telephone Encounter (Signed)
Called pt and advised Rx at pharmacy

## 2011-11-04 ENCOUNTER — Encounter: Payer: Self-pay | Admitting: Family Medicine

## 2011-11-05 NOTE — Progress Notes (Signed)
Reviewed and agree.

## 2011-11-08 ENCOUNTER — Encounter: Payer: Self-pay | Admitting: Family Medicine

## 2011-11-08 ENCOUNTER — Ambulatory Visit (INDEPENDENT_AMBULATORY_CARE_PROVIDER_SITE_OTHER): Payer: No Typology Code available for payment source | Admitting: Family Medicine

## 2011-11-08 VITALS — BP 150/86 | HR 90 | Temp 98.0°F | Resp 16 | Ht 66.0 in | Wt 246.2 lb

## 2011-11-08 DIAGNOSIS — M2550 Pain in unspecified joint: Secondary | ICD-10-CM

## 2011-11-08 DIAGNOSIS — M545 Low back pain, unspecified: Secondary | ICD-10-CM

## 2011-11-08 DIAGNOSIS — E119 Type 2 diabetes mellitus without complications: Secondary | ICD-10-CM

## 2011-11-08 DIAGNOSIS — I10 Essential (primary) hypertension: Secondary | ICD-10-CM

## 2011-11-08 DIAGNOSIS — E559 Vitamin D deficiency, unspecified: Secondary | ICD-10-CM

## 2011-11-08 MED ORDER — HYDROCODONE-ACETAMINOPHEN 5-325 MG PO TABS: 1.0000 | ORAL_TABLET | Freq: Four times a day (QID) | ORAL | Status: DC | PRN

## 2011-11-08 MED ORDER — LISINOPRIL 5 MG PO TABS: 5.0000 mg | ORAL_TABLET | Freq: Every day | ORAL | Status: DC

## 2011-11-08 NOTE — Progress Notes (Signed)
[address]   [place], [place]  [address]   [phone]  Subjective:    Patient ID: Bethany Scott, female    DOB: 1955-11-18, 56 y.o.   MRN: [id number]  HPIThis 56 y.o. female presents for follow-up and to establish care:     1.  Arthralgias:  No improvement since last visit   Toes sore; feet sore; back sore.  Knees sore.  Very unusual.  Gradual onset with acute worsening lately. Onset two months ago.    2.  Bleeding easily:  At last blood draw.  Had Botox recently; has bled a lot with last injection.  Bruises easily.  New onset with botox.  Someone stole medication --- Effexor, Advair, Vicodin at botox night.  Has samples of Advair.  Effexor was brand new.    3.  Vitamin D deficiency: taking 3 5000 IU daily.  Due for repeat labs.  4.  DMII: sugars running 80-87.  After a meal running 128-140.  Going on beach trip; when returns, will get gym membership.  Not happy with weight.    5. Lumbar strain:  Performing daily home exercises.  Taking Meloxicam.  Robaxin much improved; taking every day.   Review of Systems  Constitutional: Negative for fever, chills, diaphoresis and fatigue.  Respiratory: Negative for cough and shortness of breath.   Cardiovascular: Negative for chest pain, palpitations and leg swelling.  Gastrointestinal: Negative for nausea, vomiting, abdominal pain, diarrhea and constipation.  Musculoskeletal: Positive for back pain and arthralgias. Negative for joint swelling and gait problem.  Skin: Negative for rash.  Neurological: Negative for dizziness, syncope, facial asymmetry, speech difficulty, weakness, light-headedness, numbness and headaches.  Hematological: Bruises/bleeds easily.  Psychiatric/Behavioral: Positive for dysphoric mood. Negative for sleep disturbance.        Past Medical History  Diagnosis Date  . Allergy   . Anxiety   . Asthma   . Depression   . Diabetes mellitus without complication   . GERD (gastroesophageal reflux disease)   .  Hyperlipidemia   . Migraine   . Lipoma of unspecified site   . Elevated blood pressure reading without diagnosis of hypertension   . Other symptoms involving cardiovascular system   . Gastric ulcer, unspecified as acute or chronic, without mention of hemorrhage, perforation, or obstruction   . Personal history of colonic polyps   . Other chronic cystitis   . Obesity, unspecified   . Unspecified vitamin D deficiency     Past Surgical History  Procedure Date  . Cesarean section     x 2  . Tonsillectomy and adenoidectomy   . Partial vaginal hysterectomy 1995  . Sigmoid resection / rectopexy   . Bilateral oophorectomy 2001  . Neck surgery   . Nasal septum surgery     Prior to Admission medications   Medication Sig Start Date End Date Taking? Authorizing Provider  ALPRAZolam Prudy Feeler) 0.5 MG tablet One tablet daily PRN anxiety 10/28/11  Yes Ethelda Chick, MD  Azelastine-Fluticasone [facility]) 137-50 MCG/ACT SUSP Place into the nose.   Yes Historical Provider, MD  cetirizine (ZYRTEC) 10 MG tablet Take 10 mg by mouth daily.   Yes Historical Provider, MD  cholecalciferol (VITAMIN D) 1000 UNITS tablet Take 1,000 Units by mouth daily.   Yes Historical Provider, MD  ferrous sulfate 325 (65 FE) MG tablet Take 325 mg by mouth daily with breakfast.   Yes Historical Provider, MD  Fluticasone-Salmeterol (ADVAIR DISKUS) 100-50 MCG/DOSE AEPB Inhale 1 puff into the lungs every  12 (twelve) hours.   Yes Historical Provider, MD  montelukast (SINGULAIR) 10 MG tablet Take 10 mg by mouth at bedtime.   Yes Historical Provider, MD  Multiple Vitamin (MULTIVITAMIN) capsule Take 1 capsule by mouth daily.   Yes Historical Provider, MD  OVER THE COUNTER MEDICATION Vitamin D 5000 take one 3 times a day   Yes Historical Provider, MD  promethazine (PHENERGAN) 25 MG tablet Take 25 mg by mouth as needed.   Yes Historical Provider, MD  rizatriptan (MAXALT) 10 MG tablet Take 10 mg by mouth as needed. May repeat in 2 hours  if needed   Yes Historical Provider, MD  azelastine (OPTIVAR) 0.05 % ophthalmic solution 1 drop 2 (two) times daily.    Historical Provider, MD  baclofen (LIORESAL) 10 MG tablet Take 1 tablet (10 mg total) by mouth 2 (two) times daily. 12/04/11   Eleanore Delia Chimes, PA-C  Dexlansoprazole 30 MG capsule Take 30 mg by mouth daily.    Historical Provider, MD  HYDROcodone-acetaminophen (VICODIN) 5-500 MG per tablet Take 1 tablet by mouth 2 (two) times daily as needed for pain. 12/22/11   Eleanore Delia Chimes, PA-C  lisinopril (PRINIVIL,ZESTRIL) 5 MG tablet Take 1 tablet (5 mg total) by mouth daily. 11/08/11   Ethelda Chick, MD  Magnesium Gluconate 250 MG TABS Take by mouth daily.    Historical Provider, MD  meloxicam (MOBIC) 15 MG tablet Take 1 tablet (15 mg total) by mouth daily. 12/04/11   Eleanore Delia Chimes, PA-C  rosuvastatin (CRESTOR) 20 MG tablet Take 20 mg by mouth daily.    Historical Provider, MD  topiramate (TOPAMAX) 25 MG tablet Take 25 mg by mouth 3 (three) times daily.    Historical Provider, MD  venlafaxine XR (EFFEXOR-XR) 150 MG 24 hr capsule TAKE 1 CAPSULE EVERY DAY 12/25/11   Morrell Riddle, PA-C    Allergies  Allergen Reactions  . Cephalosporins Anaphylaxis  . Penicillins Anaphylaxis    History   Social History  . Marital Status: Married    Spouse Name: N/A    Number of Children: 2  . Years of Education: 12   Occupational History  . 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 Smoking status: Former Games developer  . Smokeless tobacco: Never Used     Comment: Quit 26 years ago  . Alcohol Use: Yes     Comment: rarley 3 times per year or less  . Drug Use: No  . Sexually Active: Yes   Other Topics Concern  . Not on file   Social History Narrative   Marital status: married; happily married; no abuse.   Children: 2 sons   Lives: with husband.   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   Exercise: none    Family History    Problem Relation Age of Onset  . Bipolar disorder Father   . Transient ischemic attack Father   . Dementia Father     secondary to head trauma  . Cancer Mother     lung  . Migraines Mother     Objective:   Physical Exam  Nursing note and vitals reviewed. Constitutional: She is oriented to person, place, and time. She appears well-developed and well-nourished. No distress.  HENT:  Head: Normocephalic and atraumatic.  Right Ear: External ear normal.  Left Ear: External ear normal.  Nose: Nose normal.  Mouth/Throat: Oropharynx is clear and moist.  Eyes: Conjunctivae  normal and EOM are normal. Pupils are equal, round, and reactive to light.  Neck: Normal range of motion. Neck supple. No thyromegaly present.  Cardiovascular: Normal rate, regular rhythm, normal heart sounds and intact distal pulses.   Pulmonary/Chest: Effort normal and breath sounds normal. She has no wheezes. She has no rales.  Abdominal: Soft. Bowel sounds are normal. There is no tenderness. There is no rebound and no guarding.  Musculoskeletal:       Lumbar back: She exhibits decreased range of motion, tenderness and pain. She exhibits no bony tenderness and no spasm.  Lymphadenopathy:    She has no cervical adenopathy.  Neurological: She is alert and oriented to person, place, and time. She has normal reflexes.  Skin: She is not diaphoretic.       Assessment & Plan:   1. Type II or unspecified type diabetes mellitus without mention of complication, not stated as uncontrolled  Hemoglobin A1c, Comprehensive metabolic panel  2. Unspecified vitamin D deficiency  Vitamin D 25 hydroxy  3. Low back pain  Ambulatory referral to Orthopedic Surgery, DISCONTINUED: HYDROcodone-acetaminophen (NORCO/VICODIN) 5-325 MG per tablet  4. Essential hypertension, benign  lisinopril (PRINIVIL,ZESTRIL) 5 MG tablet  5. Arthralgia  Ambulatory referral to Rheumatology     1. DMII:  Stable; obtain labs; continue with dietary  modification, exercise, attempts at weight loss. 2.  Vitamin D deficiency:  Uncontrolled; repeat labs today; no change to daily supplementation. 3.  Low back pain: Persistent though improved; refer to ortho.  Refill of Vicodin provided. 4.  HTN: New.  Rx for Lisinopril 5mg  daily provided.   5.  Arthralgias:  Persistent; onset two months ago; refer to rheumatology to rule out autoimmune etiology.  Meds ordered this encounter  Medications  . OVER THE COUNTER MEDICATION    Sig: Vitamin D 5000 take one 3 times a day  . DISCONTD: HYDROcodone-acetaminophen (NORCO/VICODIN) 5-325 MG per tablet    Sig: Take 1-2 tablets by mouth every 6 (six) hours as needed.    Dispense:  40 tablet    Refill:  1  . lisinopril (PRINIVIL,ZESTRIL) 5 MG tablet    Sig: Take 1 tablet (5 mg total) by mouth daily.    Dispense:  30 tablet    Refill:  5

## 2011-11-09 LAB — COMPREHENSIVE METABOLIC PANEL
Albumin: 3.9 g/dL (ref 3.5–5.2)
BUN: 11 mg/dL (ref 6–23)
CO2: 29 mEq/L (ref 19–32)
Calcium: 9.8 mg/dL (ref 8.4–10.5)
Chloride: 103 mEq/L (ref 96–112)
Glucose, Bld: 146 mg/dL — ABNORMAL HIGH (ref 70–99)
Potassium: 4.4 mEq/L (ref 3.5–5.3)

## 2011-11-09 LAB — VITAMIN D 25 HYDROXY (VIT D DEFICIENCY, FRACTURES): Vit D, 25-Hydroxy: 68 ng/mL (ref 30–89)

## 2011-11-12 ENCOUNTER — Telehealth: Payer: Self-pay | Admitting: Radiology

## 2011-11-12 NOTE — Telephone Encounter (Signed)
Notified pt of normal labs. Will mail letter.

## 2011-11-19 ENCOUNTER — Telehealth: Payer: Self-pay

## 2011-11-19 DIAGNOSIS — M545 Low back pain, unspecified: Secondary | ICD-10-CM

## 2011-11-19 NOTE — Telephone Encounter (Signed)
Patient was referred to ortho for lower back and that office would like copies of the x-rays we took here. She can pick them up if necessary, or do we send them directly? Also, pharmacy faxed over refill for rx and she wants to make sure we received it.  Best [phone]

## 2011-11-19 NOTE — Telephone Encounter (Signed)
Her CD of xrays is at front desk. I called her. She has had to RS her appt with ortho until November, she can not afford her copay until then, she wants renewal on hydrocodone to [facility]. Please advise.

## 2011-11-20 MED ORDER — HYDROCODONE-ACETAMINOPHEN 5-325 MG PO TABS: 1.0000 | ORAL_TABLET | Freq: Four times a day (QID) | ORAL | Status: DC | PRN

## 2011-11-20 NOTE — Telephone Encounter (Signed)
Medication refilled

## 2011-11-20 NOTE — Telephone Encounter (Signed)
Faxed in Rx to [facility], called pt to let her know.

## 2011-11-22 ENCOUNTER — Encounter: Payer: Self-pay | Admitting: *Deleted

## 2011-11-26 HISTORY — PX: OTHER SURGICAL HISTORY: SHX169

## 2011-12-01 ENCOUNTER — Telehealth: Payer: Self-pay | Admitting: Radiology

## 2011-12-01 DIAGNOSIS — M545 Low back pain, unspecified: Secondary | ICD-10-CM

## 2011-12-01 NOTE — Telephone Encounter (Signed)
PT STATES SHE IS HAVING TO COME BACK UP FOR THE CD THAT SHOULD HAVE BEEN CORRECTED THE FIRST TIME ALSO THE REFERRING DR STATED SHE NEEDED TO GET REFILLS FROM [place] IN NEED OF BACLOFEN 10MG S, MELOXICAM 15MG S AND VICODIN 5-325. PLEASE CALL PT AT [phone]    [facility] ON [address]

## 2011-12-01 NOTE — Telephone Encounter (Signed)
Left message for patient to return call.

## 2011-12-01 NOTE — Telephone Encounter (Signed)
I do not see that we wrote for baclofen but rather we wrote for Skelexin according to her meds record.

## 2011-12-01 NOTE — Telephone Encounter (Signed)
Patient called and stated her xray CD did not have her xrays on it , it had a picture of a family in front of a waterfall, she is instructed to bring the CD back in and we can exchange for a CD with her xrays. Request given to xray

## 2011-12-01 NOTE — Telephone Encounter (Signed)
Which specialist wants [place] to write the medications and for how long?

## 2011-12-01 NOTE — Telephone Encounter (Signed)
Please advise on medication renewal

## 2011-12-03 NOTE — Telephone Encounter (Signed)
Spoke with patient states it was changed due to price of Skelexin (200.00) for 30 pills.  Rx was changed over the phone.  [facility] [address] [facility] [place] and [place] on [address]

## 2011-12-03 NOTE — Telephone Encounter (Signed)
[facility] and [facility]odin goes to [facility].  Patient needs all refilled.

## 2011-12-03 NOTE — Telephone Encounter (Signed)
This is due to the pricing of the medication.

## 2011-12-04 MED ORDER — MELOXICAM 15 MG PO TABS: 15.0000 mg | ORAL_TABLET | Freq: Every day | ORAL | Status: DC

## 2011-12-04 MED ORDER — HYDROCODONE-ACETAMINOPHEN 5-325 MG PO TABS: 1.0000 | ORAL_TABLET | Freq: Four times a day (QID) | ORAL | Status: DC | PRN

## 2011-12-04 MED ORDER — BACLOFEN 10 MG PO TABS: 10.0000 mg | ORAL_TABLET | Freq: Two times a day (BID) | ORAL | Status: DC

## 2011-12-04 NOTE — Telephone Encounter (Signed)
[facility]  Vicodin prescription signed at desk, please send to [facility] on [address].  If her pain is not improving, she needs to return to clinic.  Or, if she is now being seen by a specialist, they need to manage her pain medications.

## 2011-12-04 NOTE — Addendum Note (Signed)
Addended by: Godfrey Pick on: 12/04/2011 12:24 PM   Modules accepted: Orders

## 2011-12-04 NOTE — Telephone Encounter (Signed)
lmom to cb. 

## 2011-12-07 ENCOUNTER — Encounter: Payer: Self-pay | Admitting: *Deleted

## 2011-12-08 NOTE — Telephone Encounter (Signed)
Spoke w/pt and she did get a call from pharmacy that her Rxs were there and she has p/up. Pt stated that she has been to ortho who said she did not require surgery and is not planning to see her regularly (ortho advised pt that she should continue to get her Rxs from [place]). Pt see rheumatologist next week and will ask them to manage her medications if she continues to be seen by them.

## 2011-12-17 ENCOUNTER — Encounter: Payer: Self-pay | Admitting: Family Medicine

## 2011-12-20 ENCOUNTER — Other Ambulatory Visit: Payer: Self-pay | Admitting: Physician Assistant

## 2011-12-21 ENCOUNTER — Telehealth: Payer: Self-pay | Admitting: Physician Assistant

## 2011-12-21 NOTE — Telephone Encounter (Signed)
Pharmacy requesting RF on hydrocodone-APAP, last filled 12/04/11

## 2011-12-22 MED ORDER — HYDROCODONE-ACETAMINOPHEN 5-500 MG PO TABS: 1.0000 | ORAL_TABLET | Freq: Two times a day (BID) | ORAL | Status: DC | PRN

## 2011-12-22 NOTE — Telephone Encounter (Signed)
OK to refill Hydrocodone-APAP 5/500 1 po bid PRN #[id number] no refills. KMS

## 2011-12-22 NOTE — Telephone Encounter (Signed)
Faxed Rx to [facility] [address].

## 2011-12-22 NOTE — Telephone Encounter (Signed)
Prescription signed at desk 

## 2011-12-24 ENCOUNTER — Other Ambulatory Visit: Payer: Self-pay | Admitting: Family Medicine

## 2011-12-25 ENCOUNTER — Other Ambulatory Visit: Payer: Self-pay | Admitting: Family Medicine

## 2011-12-27 ENCOUNTER — Other Ambulatory Visit: Payer: Self-pay | Admitting: Family Medicine

## 2012-01-05 ENCOUNTER — Telehealth: Payer: Self-pay

## 2012-01-05 NOTE — Telephone Encounter (Signed)
Call --- I gave pt her Pertussis Booster 11/22/2006; it was included in her Tetanus booster; she is up-to-date.  KMS

## 2012-01-05 NOTE — Telephone Encounter (Signed)
Dr. Katrinka Blazing,  Patient states that her grand-daughter was born premature and the pediatric doctor is asking that all family members have had a Pertussis Booster.  Patient is wanting to know if she has had this done or if she will need to have it done.    Best#: [phone]

## 2012-01-06 NOTE — Telephone Encounter (Signed)
Patient notified and voiced understanding.

## 2012-01-11 NOTE — Progress Notes (Signed)
Reviewed and agree.

## 2012-01-20 ENCOUNTER — Other Ambulatory Visit: Payer: Self-pay | Admitting: Physician Assistant

## 2012-01-21 NOTE — Telephone Encounter (Signed)
Please call in refill to pharmacy.  Thanks!

## 2012-01-23 ENCOUNTER — Other Ambulatory Visit: Payer: Self-pay | Admitting: Physician Assistant

## 2012-01-25 ENCOUNTER — Other Ambulatory Visit: Payer: Self-pay | Admitting: Physician Assistant

## 2012-01-26 NOTE — Telephone Encounter (Signed)
Looks like this was refilled on 01/20/12; did pharmacy not receive refill?  KMS

## 2012-02-04 ENCOUNTER — Other Ambulatory Visit: Payer: Self-pay | Admitting: Physician Assistant

## 2012-02-07 ENCOUNTER — Encounter: Payer: Self-pay | Admitting: Family Medicine

## 2012-02-07 ENCOUNTER — Ambulatory Visit (INDEPENDENT_AMBULATORY_CARE_PROVIDER_SITE_OTHER): Payer: No Typology Code available for payment source | Admitting: Family Medicine

## 2012-02-07 VITALS — BP 130/76 | HR 84 | Temp 98.1°F | Resp 16 | Ht 65.75 in | Wt 238.8 lb

## 2012-02-07 DIAGNOSIS — J0100 Acute maxillary sinusitis, unspecified: Secondary | ICD-10-CM

## 2012-02-07 DIAGNOSIS — G43909 Migraine, unspecified, not intractable, without status migrainosus: Secondary | ICD-10-CM

## 2012-02-07 DIAGNOSIS — E119 Type 2 diabetes mellitus without complications: Secondary | ICD-10-CM

## 2012-02-07 DIAGNOSIS — F418 Other specified anxiety disorders: Secondary | ICD-10-CM

## 2012-02-07 DIAGNOSIS — E7800 Pure hypercholesterolemia, unspecified: Secondary | ICD-10-CM

## 2012-02-07 DIAGNOSIS — G8929 Other chronic pain: Secondary | ICD-10-CM

## 2012-02-07 DIAGNOSIS — I10 Essential (primary) hypertension: Secondary | ICD-10-CM

## 2012-02-07 DIAGNOSIS — M25551 Pain in right hip: Secondary | ICD-10-CM

## 2012-02-07 DIAGNOSIS — F411 Generalized anxiety disorder: Secondary | ICD-10-CM

## 2012-02-07 LAB — CBC WITH DIFFERENTIAL/PLATELET
Eosinophils Relative: 2 % (ref 0–5)
HCT: 39.8 % (ref 36.0–46.0)
Hemoglobin: 13.6 g/dL (ref 12.0–15.0)
Lymphocytes Relative: 34 % (ref 12–46)
Lymphs Abs: 2.3 10*3/uL (ref 0.7–4.0)
MCV: 83.3 fL (ref 78.0–100.0)
Monocytes Absolute: 0.4 10*3/uL (ref 0.1–1.0)
Monocytes Relative: 6 % (ref 3–12)
Neutro Abs: 4 10*3/uL (ref 1.7–7.7)
RBC: 4.78 MIL/uL (ref 3.87–5.11)
RDW: 13.5 % (ref 11.5–15.5)
WBC: 6.9 10*3/uL (ref 4.0–10.5)

## 2012-02-07 LAB — COMPREHENSIVE METABOLIC PANEL
Albumin: 4.7 g/dL (ref 3.5–5.2)
BUN: 14 mg/dL (ref 6–23)
CO2: 27 mEq/L (ref 19–32)
Calcium: 9.9 mg/dL (ref 8.4–10.5)
Chloride: 104 mEq/L (ref 96–112)
Creat: 0.62 mg/dL (ref 0.50–1.10)
Glucose, Bld: 74 mg/dL (ref 70–99)
Potassium: 4.8 mEq/L (ref 3.5–5.3)

## 2012-02-07 LAB — POCT GLYCOSYLATED HEMOGLOBIN (HGB A1C): Hemoglobin A1C: 5.5

## 2012-02-07 LAB — LIPID PANEL
Cholesterol: 200 mg/dL (ref 0–200)
HDL: 49 mg/dL (ref >39)
Total CHOL/HDL Ratio: 4.1 Ratio

## 2012-02-07 LAB — CK: Total CK: 44 U/L (ref 7–177)

## 2012-02-07 MED ORDER — LEVOFLOXACIN 750 MG PO TABS: 750.0000 mg | ORAL_TABLET | Freq: Every day | ORAL | Status: DC

## 2012-02-07 MED ORDER — VENLAFAXINE HCL ER 150 MG PO CP24: 150.0000 mg | ORAL_CAPSULE | Freq: Every day | ORAL | Status: DC

## 2012-02-07 MED ORDER — HYDROCODONE-ACETAMINOPHEN 5-500 MG PO TABS: 1.0000 | ORAL_TABLET | Freq: Four times a day (QID) | ORAL | Status: DC | PRN

## 2012-02-07 MED ORDER — TRAMADOL HCL 50 MG PO TABS: 50.0000 mg | ORAL_TABLET | Freq: Three times a day (TID) | ORAL | Status: DC | PRN

## 2012-02-07 NOTE — Patient Instructions (Addendum)
1. Type II or unspecified type diabetes mellitus without mention of complication, not stated as uncontrolled  CBC with Differential, Comprehensive metabolic panel, Lipid panel, CK, POCT glycosylated hemoglobin (Hb A1C)  2. Pure hypercholesterolemia  CBC with Differential, Comprehensive metabolic panel, Lipid panel, CK  3. Essential hypertension, benign    4. Depression with anxiety    5. Migraines    6. Chronic arthralgias of knees and hips    7. Sinusitis, acute, maxillary

## 2012-02-07 NOTE — Progress Notes (Signed)
[address]   [place], [place]  [address]   [phone]  Subjective:    Patient ID: Bethany Scott, female    DOB: 1955/03/15, 57 y.o.   MRN: [id number]  HPIThis 57 y.o. female presents for three month follow-up:   1. Arthralgias:  S/p rheumatolgoy consult by Lavenia Atlas; started on Plaquenil for unspecified autoimmune process; 60% improved.  S/p xrays and awaiting xray results; has follow-up with Dr. Gavin Potters this month.  Still having lower back pain.  Checked in 50 people alone this morning at work.  Also lifting granddaughter makes pain in lower back worse.  Interested in Tramadol for back pain; performing home exercising three days per week.    2. URI: everyone got sore throat over the holidays, +persistent PND.  S/p Zpack.  Still suffering with PND.  No fever/chills/sweats.  +HA.  +sinus pressure. No ear pain; no sore throat; +nasal congestion; +rhinorrhea.  +scant cough; +compliance with all medications.  3.  DMII: fasting sugars 80-110.  Has really changed diet and has decreased sugar intake; weight down 8 pounds since last visit.  4.  HTN:  Not checking blood pressure at home.  Started lisinopril 5mg  daily at last visit; tolerating medication without side effects.  Denies chest pain, palpitations, shortness of breath, leg swelling  5.  Depression with anxiety; stable; had a wonderful holiday season. No major stressors.  6.  Migraines: continues to suffer with frequent migraines; missed work last week due to migraine with vomiting.  Needs refill on Vicodin.     Review of Systems  Constitutional: Negative for chills, diaphoresis and fatigue.  HENT: Positive for congestion, rhinorrhea, voice change, postnasal drip and sinus pressure. Negative for ear pain, sore throat and trouble swallowing.   Respiratory: Positive for cough. Negative for shortness of breath and wheezing.   Gastrointestinal: Negative for nausea, vomiting and diarrhea.  Musculoskeletal: Positive for back pain  and arthralgias. Negative for joint swelling.  Skin: Negative for rash.  Neurological: Positive for headaches. Negative for dizziness, tremors, syncope, facial asymmetry, speech difficulty, weakness, light-headedness and numbness.  Psychiatric/Behavioral: Negative for suicidal ideas, sleep disturbance, self-injury and dysphoric mood. The patient is not nervous/anxious.         Past Medical History  Diagnosis Date  . Allergy   . Anxiety   . Asthma   . Depression   . Diabetes mellitus without complication   . GERD (gastroesophageal reflux disease)   . Hyperlipidemia   . Migraine   . Lipoma of unspecified site   . Elevated blood pressure reading without diagnosis of hypertension   . Other symptoms involving cardiovascular system   . Gastric ulcer, unspecified as acute or chronic, without mention of hemorrhage, perforation, or obstruction   . Personal history of colonic polyps   . Other chronic cystitis   . Obesity, unspecified   . Unspecified vitamin D deficiency   . Arthritis   . Hypertension     Past Surgical History  Procedure Date  . Cesarean section     x 2  . Tonsillectomy and adenoidectomy   . Partial vaginal hysterectomy 1995  . Sigmoid resection / rectopexy   . Bilateral oophorectomy 2001  . Neck surgery   . Nasal septum surgery   . Spine surgery   . Appendectomy   . Colon surgery   . Abdominal hysterectomy     Prior to Admission medications   Medication Sig Start Date End Date Taking? Authorizing Provider  ALPRAZolam Prudy Feeler) 0.5 MG  tablet One tablet daily PRN anxiety 10/28/11  Yes Ethelda Chick, MD  azelastine (OPTIVAR) 0.05 % ophthalmic solution 1 drop 2 (two) times daily.   Yes Historical Provider, MD  Azelastine-Fluticasone (DYMISTA) 137-50 MCG/ACT SUSP Place into the nose.   Yes Historical Provider, MD  baclofen (LIORESAL) 10 MG tablet Take 1 tablet (10 mg total) by mouth 2 (two) times daily. 12/04/11  Yes Eleanore Delia Chimes, PA-C  cetirizine (ZYRTEC) 10 MG  tablet Take 10 mg by mouth daily.   Yes Historical Provider, MD  cholecalciferol (VITAMIN D) 1000 UNITS tablet Take 1,000 Units by mouth daily.   Yes Historical Provider, MD  ferrous sulfate 325 (65 FE) MG tablet Take 325 mg by mouth daily with breakfast.   Yes Historical Provider, MD  Fluticasone-Salmeterol (ADVAIR DISKUS) 100-50 MCG/DOSE AEPB Inhale 1 puff into the lungs every 12 (twelve) hours.   Yes Historical Provider, MD  HYDROcodone-acetaminophen (VICODIN) 5-500 MG per tablet Take 1 tablet by mouth every 6 (six) hours as needed for pain. 02/07/12  Yes Ethelda Chick, MD  hydroxychloroquine (PLAQUENIL) 200 MG tablet Take by mouth daily.   Yes Historical Provider, MD  lisinopril (PRINIVIL,ZESTRIL) 5 MG tablet Take 1 tablet (5 mg total) by mouth daily. 11/08/11  Yes Ethelda Chick, MD  Magnesium Gluconate 250 MG TABS Take by mouth daily.   Yes Historical Provider, MD  meloxicam (MOBIC) 15 MG tablet Take 1 tablet (15 mg total) by mouth daily. 12/04/11  Yes Eleanore E Egan, PA-C  montelukast (SINGULAIR) 10 MG tablet Take 10 mg by mouth at bedtime.   Yes Historical Provider, MD  Multiple Vitamin (MULTIVITAMIN) capsule Take 1 capsule by mouth daily.   Yes Historical Provider, MD  OVER THE COUNTER MEDICATION Vitamin D 5000 take one 3 times a day   Yes Historical Provider, MD  promethazine (PHENERGAN) 25 MG tablet Take 25 mg by mouth as needed.   Yes Historical Provider, MD  rizatriptan (MAXALT) 10 MG tablet Take 10 mg by mouth as needed. May repeat in 2 hours if needed   Yes Historical Provider, MD  rosuvastatin (CRESTOR) 20 MG tablet Take 20 mg by mouth daily.   Yes Historical Provider, MD  venlafaxine XR (EFFEXOR-XR) 150 MG 24 hr capsule Take 1 capsule (150 mg total) by mouth daily. 02/07/12  Yes Ethelda Chick, MD  baclofen (LIORESAL) 10 MG tablet TAKE 1 TABLET (10 MG TOTAL) BY MOUTH 2 (TWO) TIMES DAILY. 01/23/12   Heather Jaquita Rector, PA-C  levofloxacin (LEVAQUIN) 750 MG tablet Take 1 tablet (750 mg  total) by mouth daily. 02/07/12   Ethelda Chick, MD  traMADol (ULTRAM) 50 MG tablet Take 1 tablet (50 mg total) by mouth every 8 (eight) hours as needed for pain. 02/07/12   Ethelda Chick, MD    Allergies  Allergen Reactions  . Cephalosporins Anaphylaxis  . Penicillins Anaphylaxis    History   Social History  . Marital Status: Married    Spouse Name: N/A    Number of Children: 2  . Years of Education: 12   Occupational History  . 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 Smoking status: Former Games developer  . Smokeless tobacco: Never Used     Comment: Quit 26 years ago  . Alcohol Use: Yes     Comment: rarley 3 times per year or less  . Drug Use: No  . Sexually Active: Yes  Other Topics Concern  . Not on file   Social History Narrative   Marital status: married; happily married; no abuse.   Children: 2 sons   Lives: with husband.   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   Exercise: none    Family History  Problem Relation Age of Onset  . Bipolar disorder Father   . Transient ischemic attack Father   . Dementia Father     secondary to head trauma  . Cancer Mother     lung  . Migraines Mother     Objective:   Physical Exam  Nursing note and vitals reviewed. Constitutional: She is oriented to person, place, and time. She appears well-developed and well-nourished. No distress.  HENT:  Head: Normocephalic and atraumatic.  Right Ear: External ear normal.  Left Ear: External ear normal.  Nose: Mucosal edema and rhinorrhea present. Right sinus exhibits no maxillary sinus tenderness and no frontal sinus tenderness. Left sinus exhibits no maxillary sinus tenderness and no frontal sinus tenderness.  Mouth/Throat: Oropharynx is clear and moist. No oropharyngeal exudate.  Eyes: Conjunctivae normal and EOM are normal. Pupils are equal, round, and reactive to light.  Neck: Normal range of motion. Neck supple. No  thyromegaly present.  Cardiovascular: Normal rate, regular rhythm, normal heart sounds and intact distal pulses.  Exam reveals no gallop and no friction rub.   No murmur heard. Pulmonary/Chest: Effort normal and breath sounds normal. She has no wheezes. She has no rales.  Musculoskeletal:       Cervical back: Normal.       Lumbar back: She exhibits decreased range of motion and tenderness. She exhibits no bony tenderness, no pain and no spasm.       LUMBAR SPINE:  DECREASED RANGE OF MOTION LUMBAR SPINE; MOTOR 5/5 BLE; TOE AND HEEL WALKING INTACT; MARCHING INTACT. STRAIGHT LEG RAISES NEGATIVE.  Lymphadenopathy:    She has cervical adenopathy.  Neurological: She is alert and oriented to person, place, and time. No cranial nerve deficit. She exhibits normal muscle tone. Coordination normal.  Skin: She is not diaphoretic.  Psychiatric: She has a normal mood and affect. Her behavior is normal. Judgment and thought content normal.       Assessment & Plan:   1. Type II or unspecified type diabetes mellitus without mention of complication, not stated as uncontrolled  CBC with Differential, Comprehensive metabolic panel, Lipid panel, CK, POCT glycosylated hemoglobin (Hb A1C)  2. Pure hypercholesterolemia  CBC with Differential, Comprehensive metabolic panel, Lipid panel, CK  3. Essential hypertension, benign    4. Depression with anxiety    5. Migraines    6. Chronic arthralgias of knees and hips    7. Sinusitis, acute, maxillary

## 2012-02-08 ENCOUNTER — Encounter: Payer: Self-pay | Admitting: Family Medicine

## 2012-02-08 DIAGNOSIS — E7800 Pure hypercholesterolemia, unspecified: Secondary | ICD-10-CM | POA: Insufficient documentation

## 2012-02-08 DIAGNOSIS — E669 Obesity, unspecified: Secondary | ICD-10-CM | POA: Insufficient documentation

## 2012-02-08 DIAGNOSIS — J0100 Acute maxillary sinusitis, unspecified: Secondary | ICD-10-CM | POA: Insufficient documentation

## 2012-02-08 DIAGNOSIS — G43909 Migraine, unspecified, not intractable, without status migrainosus: Secondary | ICD-10-CM | POA: Insufficient documentation

## 2012-02-08 DIAGNOSIS — I10 Essential (primary) hypertension: Secondary | ICD-10-CM | POA: Insufficient documentation

## 2012-02-08 DIAGNOSIS — G8929 Other chronic pain: Secondary | ICD-10-CM | POA: Insufficient documentation

## 2012-02-08 DIAGNOSIS — M25562 Pain in left knee: Secondary | ICD-10-CM | POA: Insufficient documentation

## 2012-02-08 DIAGNOSIS — E1169 Type 2 diabetes mellitus with other specified complication: Secondary | ICD-10-CM | POA: Insufficient documentation

## 2012-02-08 DIAGNOSIS — F418 Other specified anxiety disorders: Secondary | ICD-10-CM | POA: Insufficient documentation

## 2012-02-08 NOTE — Assessment & Plan Note (Signed)
Stable; refill of Effexor provided.

## 2012-02-08 NOTE — Assessment & Plan Note (Signed)
Stable; refill of hydrocodone provided.

## 2012-02-08 NOTE — Assessment & Plan Note (Signed)
New. Rx for Levaquin 750mg  daily.  Continue current asthma and allergic rhinitis regimen.

## 2012-02-08 NOTE — Assessment & Plan Note (Signed)
Improved with Plaquenil.  Follow-up with rheumatology in upcoming week.  Increase home exercise program for lower back to daily; may benefit from formal physical therapy; to discuss further with rheumatology.

## 2012-02-08 NOTE — Assessment & Plan Note (Signed)
Controlled.  Obtain labs; continue with weight loss, dietary modification.  Congratulations on weight loss.

## 2012-02-08 NOTE — Assessment & Plan Note (Signed)
Controlled; obtain labs; continue current medications. 

## 2012-02-08 NOTE — Assessment & Plan Note (Signed)
Improved; tolerating Lisinopril without side effects; obtain labs.

## 2012-02-16 ENCOUNTER — Other Ambulatory Visit: Payer: Self-pay | Admitting: Family Medicine

## 2012-02-17 ENCOUNTER — Telehealth: Payer: Self-pay

## 2012-02-17 NOTE — Telephone Encounter (Signed)
Bethany Scott called and stated she had seen Dr. Katrinka Blazing earlier in the month.  She has sent a request through her pharmacy for a prescription, and she wanted to let Dr. Katrinka Blazing know why she requested the medication.  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

## 2012-02-18 MED ORDER — HYDROCODONE-ACETAMINOPHEN 5-500 MG PO TABS: 1.0000 | ORAL_TABLET | Freq: Four times a day (QID) | ORAL | Status: DC | PRN

## 2012-02-18 NOTE — Telephone Encounter (Signed)
Spoke with pt she requested generic Vicodin, she goes to the rheumatologist next week. Her toes are painful and swollen. She is also having headaches due to the weather changing. Can we refill her Vicodin, please advise.

## 2012-02-18 NOTE — Telephone Encounter (Signed)
Please return call---I am very concerned regarding the amount of hydrocodone that pt has been using over the past ten days.  I will refill her hydrocodone this one time early; I expect her usual prescription to last a whole month. If her increased frequency of hydrocodone continues, I will need to refer her to a headache specialist.  Please call in her rx; I am out of the office.

## 2012-02-21 ENCOUNTER — Telehealth: Payer: Self-pay

## 2012-02-21 NOTE — Telephone Encounter (Signed)
Called pt and explained Dr Michaelle Copas concern. Pt stated that she is going to the rheumatologist end of this week and voiced understanding for HA specialist if HAs continue. Pt stated she didn't think her pharmacy has the Rx because she had called to check on it. I called pharmacy and gave them the Rx over the phone.

## 2012-02-21 NOTE — Telephone Encounter (Signed)
[facility] [phone]. [facility]ARMAC[facility] CALLED TO VERIFY RX TO CHANGE VICODIN TO 05/25? PLEASE CALL BACK TO CLARIFY AND FIX THIS THANK YOU!

## 2012-02-21 NOTE — Telephone Encounter (Signed)
[facility] CALLING AGAIN TO CHANGE THE STRENGTH ON VICODIN RX. SAME NUMBER- [phone].

## 2012-02-22 ENCOUNTER — Telehealth: Payer: Self-pay

## 2012-02-22 NOTE — Telephone Encounter (Signed)
Agree with change in rx to 5/325 from 5/500.  KMS

## 2012-02-22 NOTE — Telephone Encounter (Signed)
Called pharmacy, changed Hydrocodone to 5/325 instead of 5/500.

## 2012-02-22 NOTE — Telephone Encounter (Signed)
Patient called asking to speak to a nurse. She wants to talk to someone in regards to getting the vicodin 5mg  hydrocodone to the right 325mg  acetemenaphine to be filled at pharmacy? I don't  understand fully what that all means exactly. But she needs the right ones approved in order to get them filled.

## 2012-02-22 NOTE — Telephone Encounter (Signed)
Called pt to advise this has been done.

## 2012-03-19 ENCOUNTER — Other Ambulatory Visit: Payer: Self-pay | Admitting: Physician Assistant

## 2012-03-19 ENCOUNTER — Other Ambulatory Visit: Payer: Self-pay | Admitting: Family Medicine

## 2012-03-20 ENCOUNTER — Other Ambulatory Visit: Payer: Self-pay | Admitting: Family Medicine

## 2012-03-24 ENCOUNTER — Other Ambulatory Visit: Payer: Self-pay | Admitting: Family Medicine

## 2012-03-24 NOTE — Telephone Encounter (Signed)
Faxed [facility]

## 2012-04-06 ENCOUNTER — Other Ambulatory Visit: Payer: Self-pay | Admitting: Family Medicine

## 2012-04-20 ENCOUNTER — Other Ambulatory Visit: Payer: Self-pay | Admitting: Family Medicine

## 2012-04-21 ENCOUNTER — Other Ambulatory Visit: Payer: Self-pay | Admitting: Family Medicine

## 2012-04-22 ENCOUNTER — Other Ambulatory Visit: Payer: Self-pay | Admitting: Family Medicine

## 2012-04-23 NOTE — Telephone Encounter (Signed)
Just approved 5-300 hydrocodone on 3/27.  Not sure where 5-300 rx originated from?  Please call pharmacy for clarification. Just received refill request for 5-325; doe they make 5-300?  Did she get 5-300 filled on 04/20/12?

## 2012-04-30 ENCOUNTER — Other Ambulatory Visit: Payer: Self-pay | Admitting: Family Medicine

## 2012-05-03 ENCOUNTER — Ambulatory Visit (INDEPENDENT_AMBULATORY_CARE_PROVIDER_SITE_OTHER): Payer: No Typology Code available for payment source | Admitting: Family Medicine

## 2012-05-03 ENCOUNTER — Encounter: Payer: Self-pay | Admitting: Family Medicine

## 2012-05-03 VITALS — BP 127/78 | HR 88 | Temp 97.6°F | Resp 18 | Wt 244.0 lb

## 2012-05-03 DIAGNOSIS — E7800 Pure hypercholesterolemia, unspecified: Secondary | ICD-10-CM

## 2012-05-03 DIAGNOSIS — G43909 Migraine, unspecified, not intractable, without status migrainosus: Secondary | ICD-10-CM

## 2012-05-03 DIAGNOSIS — I10 Essential (primary) hypertension: Secondary | ICD-10-CM

## 2012-05-03 DIAGNOSIS — E119 Type 2 diabetes mellitus without complications: Secondary | ICD-10-CM

## 2012-05-03 DIAGNOSIS — F418 Other specified anxiety disorders: Secondary | ICD-10-CM

## 2012-05-03 DIAGNOSIS — F341 Dysthymic disorder: Secondary | ICD-10-CM

## 2012-05-03 DIAGNOSIS — J0100 Acute maxillary sinusitis, unspecified: Secondary | ICD-10-CM

## 2012-05-03 DIAGNOSIS — N952 Postmenopausal atrophic vaginitis: Secondary | ICD-10-CM

## 2012-05-03 LAB — CK TOTAL AND CKMB (NOT AT ARMC): CK, MB: 0.9 ng/mL (ref 0.3–4.0)

## 2012-05-03 LAB — COMPREHENSIVE METABOLIC PANEL
ALT: 13 U/L (ref 0–35)
AST: 11 U/L (ref 0–37)
Albumin: 4.4 g/dL (ref 3.5–5.2)
BUN: 16 mg/dL (ref 6–23)
CO2: 29 mEq/L (ref 19–32)
Calcium: 9.7 mg/dL (ref 8.4–10.5)
Chloride: 104 mEq/L (ref 96–112)
Potassium: 4.3 mEq/L (ref 3.5–5.3)

## 2012-05-03 LAB — CBC
HCT: 35 % — ABNORMAL LOW (ref 36.0–46.0)
Hemoglobin: 11.4 g/dL — ABNORMAL LOW (ref 12.0–15.0)
MCHC: 32.6 g/dL (ref 30.0–36.0)
MCV: 79.4 fL (ref 78.0–100.0)
RDW: 14.2 % (ref 11.5–15.5)

## 2012-05-03 LAB — LIPID PANEL: Cholesterol: 198 mg/dL (ref 0–200)

## 2012-05-03 LAB — HEMOGLOBIN A1C
Hgb A1c MFr Bld: 6.2 % — ABNORMAL HIGH (ref <5.7)
Mean Plasma Glucose: 131 mg/dL — ABNORMAL HIGH (ref <117)

## 2012-05-03 MED ORDER — ESTRADIOL 0.1 MG/GM VA CREA: TOPICAL_CREAM | VAGINAL | Status: DC

## 2012-05-03 MED ORDER — LEVOFLOXACIN 750 MG PO TABS: 750.0000 mg | ORAL_TABLET | Freq: Every day | ORAL | Status: DC

## 2012-05-03 MED ORDER — HYDROCODONE-ACETAMINOPHEN 5-325 MG PO TABS: 1.0000 | ORAL_TABLET | Freq: Three times a day (TID) | ORAL | Status: DC | PRN

## 2012-05-03 NOTE — Progress Notes (Signed)
[address]   [place], [place]  [address]   [phone]  Subjective:    Patient ID: Bethany Scott, female    DOB: 04-21-1955, 57 y.o.   MRN: [id number]  HPI This 57 y.o. female presents for three month follow-up:  1. DMII: three month follow-up; no changes to management made at last visit; sugars running < 120 fasting in mornings.    2.  HTN:  Three month follow-up; no changes to management made at last visit; reports compliance with medication, good tolerance to medication, good symptom control.  3.  Hyperlipidemia: three month follow-up; reports good compliance with medication, good tolerance to medication; good symptom control.   4. Depression:  Has suffered with a horrible personal stressor since last visit; husband pocket called patient on accident while out of town on business trip; patient heard husband having an affair; event occurred two months ago; has really struggled with infidelity of husband.  Has decided to stay with husband for the family.  Has been married x 30+ years.  Does not know that she can start over alone at this point in her life.  Excellent support from sons and families.  Pt takes blame for lack of libido.  Husband is frustrated by her lack of improvement with situation.  Good friend at work now having an affair and pt really struggles with relationship with friend.  Staying very busy with granddaughter who really brings her happiness.  Gets nauseated a lot and has vomited on several occasions on the way home from work.  Denies SI/HI.  Has considered counseling but has not been ready yet.  Requesting medication for vaginal dryness.  5. Migraines: have worsened due to weather changes and marital stressors.  6.  Autoimmune process:  Following up regularly with Dr. Gavin Potters.    7.  Sinus congestion: present for several weeks. No fever/chills/sweats.  +HA. No ear pain or sore throat.  +nasal congestion, +rhinorrhea.  Mild cough. No wheezing; no horrible  hoarseness.   Review of Systems  Constitutional: Negative for fever, chills, diaphoresis and fatigue.  HENT: Positive for congestion, rhinorrhea, sneezing and postnasal drip. Negative for sore throat, trouble swallowing and voice change.   Respiratory: Negative for cough, shortness of breath, wheezing and stridor.   Cardiovascular: Negative for chest pain, palpitations and leg swelling.  Gastrointestinal: Positive for nausea and vomiting. Negative for abdominal pain, diarrhea and constipation.  Musculoskeletal: Positive for myalgias, back pain, joint swelling and arthralgias.  Skin: Negative for wound.  Neurological: Positive for headaches. Negative for dizziness, tremors, seizures, syncope, facial asymmetry, speech difficulty, weakness, light-headedness and numbness.  Psychiatric/Behavioral: Positive for dysphoric mood. Negative for suicidal ideas and self-injury. The patient is nervous/anxious.    Past Medical History  Diagnosis Date  . Allergy   . Anxiety   . Asthma   . Depression   . Diabetes mellitus without complication   . GERD (gastroesophageal reflux disease)   . Hyperlipidemia   . Migraine   . Lipoma of unspecified site   . Elevated blood pressure reading without diagnosis of hypertension   . Other symptoms involving cardiovascular system   . Gastric ulcer, unspecified as acute or chronic, without mention of hemorrhage, perforation, or obstruction   . Personal history of colonic polyps   . Other chronic cystitis   . Obesity, unspecified   . Unspecified vitamin D deficiency   . Arthritis   . Hypertension    Past Surgical History  Procedure Laterality Date  . Cesarean  section      x 2  . Tonsillectomy and adenoidectomy    . Partial vaginal hysterectomy  1995  . Sigmoid resection / rectopexy    . Bilateral oophorectomy  2001  . Neck surgery    . Nasal septum surgery    . Spine surgery    . Appendectomy    . Colon surgery    . Abdominal hysterectomy    .  Rheumatology consult  11/26/2011    multiple arthralgias. Autoimmune labs negative.  Rx for Plaquenil for non-specific autoimmune process.  Lavenia Atlas.   Current Outpatient Prescriptions on File Prior to Visit  Medication Sig Dispense Refill  . ALPRAZolam (XANAX) 0.5 MG tablet TAKE 1 TABLET BY MOUTH EVERY DAY AS NEEDED FOR ANXIETY  30 tablet  0  . baclofen (LIORESAL) 10 MG tablet Take 1 tablet (10 mg total) by mouth 2 (two) times daily.  30 each  0  . cetirizine (ZYRTEC) 10 MG tablet Take 10 mg by mouth daily.      . cholecalciferol (VITAMIN D) 1000 UNITS tablet Take 1,000 Units by mouth daily.      . ferrous sulfate 325 (65 FE) MG tablet Take 325 mg by mouth daily with breakfast.      . Fluticasone-Salmeterol (ADVAIR DISKUS) 100-50 MCG/DOSE AEPB Inhale 1 puff into the lungs every 12 (twelve) hours.      . hydroxychloroquine (PLAQUENIL) 200 MG tablet Take 200 mg by mouth 2 (two) times daily.       [name] lisinopril (PRINIVIL,ZESTRIL) 5 MG tablet TAKE 1 TABLET (5 MG TOTAL) BY MOUTH DAILY.  30 tablet  5  . meloxicam (MOBIC) 15 MG tablet Take 1 tablet (15 mg total) by mouth daily.  30 tablet  0  . montelukast (SINGULAIR) 10 MG tablet Take 10 mg by mouth at bedtime.      . Multiple Vitamin (MULTIVITAMIN) capsule Take 1 capsule by mouth daily.      . pantoprazole (PROTONIX) 40 MG tablet       . promethazine (PHENERGAN) 25 MG tablet Take 25 mg by mouth as needed.      . rizatriptan (MAXALT) 10 MG tablet Take 10 mg by mouth as needed. May repeat in 2 hours if needed      . rosuvastatin (CRESTOR) 20 MG tablet Take 20 mg by mouth daily.      . traMADol (ULTRAM) 50 MG tablet TAKE 1 TABLET (50 MG TOTAL) BY MOUTH EVERY 8 (EIGHT) HOURS AS NEEDED FOR PAIN.  30 tablet  0  . venlafaxine XR (EFFEXOR-XR) 150 MG 24 hr capsule Take 1 capsule (150 mg total) by mouth daily.  30 capsule  6   No current facility-administered medications on file prior to visit.   History   Social History  . Marital Status: Married     Spouse Name: N/A    Number of Children: 2  . Years of Education: 12   Occupational History  . 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 Smoking status: Former Games developer  . Smokeless tobacco: Never Used     Comment: Quit 26 years ago  . Alcohol Use: Yes     Comment: rarley 3 times per year or less  . Drug Use: No  . Sexually Active: Yes   Other Topics Concern  . Not on file   Social History Narrative   Marital status: married; happily married; no abuse.  Children: 2 sons      Lives: with husband.      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      Exercise: none       Objective:   Physical Exam  Nursing note and vitals reviewed. Constitutional: She is oriented to person, place, and time. She appears well-developed and well-nourished. No distress.  HENT:  Head: Normocephalic and atraumatic.  Right Ear: External ear normal.  Left Ear: External ear normal.  Nose: Nose normal.  Mouth/Throat: Oropharynx is clear and moist.  Eyes: Conjunctivae and EOM are normal. Pupils are equal, round, and reactive to light.  Neck: Normal range of motion. Neck supple. No thyromegaly present.  Cardiovascular: Normal rate, regular rhythm, normal heart sounds and intact distal pulses.  Exam reveals no gallop and no friction rub.   No murmur heard. Pulmonary/Chest: Effort normal and breath sounds normal. She has no wheezes. She has no rales.  Abdominal: Soft. Bowel sounds are normal. She exhibits no distension. There is no tenderness. There is no rebound and no guarding.  Lymphadenopathy:    She has no cervical adenopathy.  Neurological: She is alert and oriented to person, place, and time. No cranial nerve deficit. She exhibits normal muscle tone. Coordination normal.  Skin: She is not diaphoretic.  Psychiatric: She has a normal mood and affect. Her behavior is normal. Judgment and thought content normal.  Tearful.        Assessment & Plan:  Type II or unspecified type diabetes mellitus without mention of complication, not stated as uncontrolled - Plan: CBC, CK total and CKMB, Comprehensive metabolic panel, Lipid panel, Hemoglobin A1c  Unspecified essential hypertension - Plan: CBC, CK total and CKMB, Comprehensive metabolic panel, Lipid panel  Pure hypercholesterolemia - Plan: CBC, CK total and CKMB, Comprehensive metabolic panel, Lipid panel  Sinusitis, acute, maxillary - Plan: levofloxacin (LEVAQUIN) 750 MG tablet  Migraines - Plan: HYDROcodone-acetaminophen (NORCO/VICODIN) 5-325 MG per tablet  Depression with anxiety  Atrophic vaginitis - Plan: estradiol (ESTRACE VAGINAL) 0.1 MG/GM vaginal cream   1.  DMII: controlled; obtain labs. 2.  HTN: controlled; obtain labs; no change in management. 3.  Hypercholesterolemia: moderately controlled; obtain labs; continue current medications. 4.  Acute Sinusitis Maxillary: New. Rx for Levaquin provided. 5. Migraines: worsening with acute stressors; refill of Hydrocodone provided. 6.  Depression with anxiety: worsening yet seems to be coping relatively well with marital issues; counseling provided during visit; no change in medication at this time; highly recommend intense psychotherapy. 7. Atrophic Vaginitis: chronic issue; agreeable to trial of topical estrogen cream.  Discussed HRT and pt poor candidate due to comorbidities.  Meds ordered this encounter  Medications  . HYDROcodone-acetaminophen (NORCO/VICODIN) 5-325 MG per tablet    Sig: Take 1 tablet by mouth every 8 (eight) hours as needed for pain.    Dispense:  45 tablet    Refill:  3  . levofloxacin (LEVAQUIN) 750 MG tablet    Sig: Take 1 tablet (750 mg total) by mouth daily.    Dispense:  10 tablet    Refill:  0  . estradiol (ESTRACE VAGINAL) 0.1 MG/GM vaginal cream    Sig: Use as directed    Dispense:  42.5 g    Refill:  12

## 2012-05-03 NOTE — Patient Instructions (Addendum)
Type II or unspecified type diabetes mellitus without mention of complication, not stated as uncontrolled - Plan: CBC, CK total and CKMB, Comprehensive metabolic panel, Lipid panel, Hemoglobin A1c  Unspecified essential hypertension - Plan: CBC, CK total and CKMB, Comprehensive metabolic panel, Lipid panel  Pure hypercholesterolemia - Plan: CBC, CK total and CKMB, Comprehensive metabolic panel, Lipid panel  Sinusitis, acute, maxillary - Plan: levofloxacin (LEVAQUIN) 750 MG tablet  Migraines - Plan: HYDROcodone-acetaminophen (NORCO/VICODIN) 5-325 MG per tablet  Depression with anxiety  Atrophic vaginitis - Plan: estradiol (ESTRACE VAGINAL) 0.1 MG/GM vaginal cream

## 2012-06-05 ENCOUNTER — Ambulatory Visit (INDEPENDENT_AMBULATORY_CARE_PROVIDER_SITE_OTHER): Payer: No Typology Code available for payment source | Admitting: Family Medicine

## 2012-06-05 ENCOUNTER — Other Ambulatory Visit: Payer: Self-pay | Admitting: Physician Assistant

## 2012-06-05 ENCOUNTER — Ambulatory Visit: Payer: No Typology Code available for payment source | Admitting: Family Medicine

## 2012-06-05 ENCOUNTER — Encounter: Payer: Self-pay | Admitting: Family Medicine

## 2012-06-05 ENCOUNTER — Other Ambulatory Visit: Payer: Self-pay | Admitting: Family Medicine

## 2012-06-05 VITALS — BP 122/60 | HR 97 | Temp 98.7°F | Resp 16 | Ht 65.5 in | Wt 241.0 lb

## 2012-06-05 DIAGNOSIS — D539 Nutritional anemia, unspecified: Secondary | ICD-10-CM

## 2012-06-05 DIAGNOSIS — D649 Anemia, unspecified: Secondary | ICD-10-CM

## 2012-06-05 DIAGNOSIS — R252 Cramp and spasm: Secondary | ICD-10-CM

## 2012-06-05 DIAGNOSIS — K1370 Unspecified lesions of oral mucosa: Secondary | ICD-10-CM

## 2012-06-05 DIAGNOSIS — K121 Other forms of stomatitis: Secondary | ICD-10-CM

## 2012-06-05 LAB — POCT CBC
HCT, POC: 40.4 % (ref 37.7–47.9)
Lymph, poc: 2.5 (ref 0.6–3.4)
MCH, POC: 26.4 pg — AB (ref 27–31.2)
MCHC: 30.7 g/dL — AB (ref 31.8–35.4)
MCV: 86.1 fL (ref 80–97)
MID (cbc): 0.4 (ref 0–0.9)
POC LYMPH PERCENT: 42.9 %L (ref 10–50)
RDW, POC: 14.6 %

## 2012-06-05 LAB — IFOBT (OCCULT BLOOD): IFOBT: POSITIVE

## 2012-06-05 LAB — BASIC METABOLIC PANEL
CO2: 31 mEq/L (ref 19–32)
Calcium: 9.9 mg/dL (ref 8.4–10.5)
Creat: 0.71 mg/dL (ref 0.50–1.10)
Sodium: 142 mEq/L (ref 135–145)

## 2012-06-05 NOTE — Progress Notes (Signed)
   [address]   [place], [place]  [address]   [phone]  Subjective:    Patient ID: Bethany Scott, female    DOB: 03/16/1955, 57 y.o.   MRN: [id number]  HPI This 57 y.o. female presents for evaluation of anemia.  Evaluated last month in clinic for three month follow-up.  Hemoglobin slightly low at 11.4 which was down from 13.6.  History of anemia in past two years.  Has been taking iron ever since; rare red meat.  Eats red meat once every two weeks; eats spinach in salads.  One iron tablet daily every morning.  +fatigued.  Worried about low potassium level.  +leg cramps.  Had to see Dr. Gavin Potters and he was going to change Plaquenil and was going to switch to MTX; he would not switch secondary to anemia.  Referred to ortho/Chris Katrinka Blazing; s/p steroid injections in knee.  History of ulcer while pregnant.  Mild abdominal pain but taking Protonix daily.  +nausea intermittently; twice weekly; no morning nausea.  Vomiting is usually stress related.  Regular bowel movements; no change in bowel habits.  No bloody stools; no melena.  Iron makes stools dark.  Intermittent lightheadedness.  Last colonoscopy 2009; due for repeat colonoscopy; Mechele Collin; has been mailed postcard.  Also getting frequent mouth ulcerations; chronic issue.    Review of Systems  Constitutional: Negative for fever, chills, diaphoresis, activity change, appetite change and fatigue.  Gastrointestinal: Positive for nausea, vomiting and abdominal pain. Negative for diarrhea, constipation, blood in stool, abdominal distention, anal bleeding and rectal pain.  Musculoskeletal:       Leg cramps.  Neurological: Positive for light-headedness. Negative for dizziness.  Hematological: Negative for adenopathy. Does not bruise/bleed easily.       Objective:   Physical Exam  Nursing note and vitals reviewed. Constitutional: She is oriented to person, place, and time. She appears well-developed and well-nourished. No distress.  Cardiovascular:  Normal rate, regular rhythm, normal heart sounds and intact distal pulses.   Pulmonary/Chest: Effort normal and breath sounds normal. She has no wheezes. She has no rales.  Abdominal: Soft. Bowel sounds are normal. There is no tenderness. There is no rebound and no guarding.  Genitourinary: Rectum normal.  Musculoskeletal:       Lumbar back: Normal.       Right lower leg: Normal.       Left lower leg: Normal.  Neurological: She is alert and oriented to person, place, and time.  Skin: Skin is warm and dry. No rash noted. She is not diaphoretic. No erythema.  Psychiatric: She has a normal mood and affect. Her behavior is normal.       Assessment & Plan:  Unspecified deficiency anemia - Plan: POCT CBC, Iron, IFOBT POC (occult bld, rslt in office)  Leg cramps - Plan: Basic metabolic panel  Mouth ulcers   1. Anemia: chronic issue for two years with recent worsening; obtain hemosure; repeat CBC.  Due for repeat colonoscopy; advised to contact GI for appointment. 2.  Leg cramps: New.  Obtain labs to evaluate potassium level.  Recommend increased hydration, daily stretches, MVI daily. 3.  Mouth ulcerations: New/worsening; recommend discussing with rheumatology as part of connective tissue/autoimmune process versus side effect to medications.

## 2012-06-06 NOTE — Telephone Encounter (Signed)
Dr Katrinka Blazing, do you want to RF this? I didn't see it specifically mentioned in last couple of OV notes.

## 2012-06-16 ENCOUNTER — Telehealth: Payer: Self-pay

## 2012-06-16 DIAGNOSIS — K649 Unspecified hemorrhoids: Secondary | ICD-10-CM

## 2012-06-16 DIAGNOSIS — D62 Acute posthemorrhagic anemia: Secondary | ICD-10-CM

## 2012-06-16 DIAGNOSIS — R195 Other fecal abnormalities: Secondary | ICD-10-CM

## 2012-06-16 NOTE — Telephone Encounter (Signed)
Patient saw dr Katrinka Blazing 2 weeks or so ago and dr Katrinka Blazing did look at her Hemorrhoids but she has not received her referral yet and would like to know if we can call something in for her if possible [phone]

## 2012-06-16 NOTE — Telephone Encounter (Signed)
I do not see a referral for this, pended one.

## 2012-06-22 ENCOUNTER — Other Ambulatory Visit: Payer: Self-pay | Admitting: Family Medicine

## 2012-06-23 NOTE — Telephone Encounter (Signed)
Called her, to advise. Will you check on this for her?

## 2012-06-23 NOTE — Telephone Encounter (Signed)
Please call pt; I placed referral for appointment with Dr. Mechele Collin at [facility] for anemia, guaic + stools, hemorrhoids.

## 2012-06-25 ENCOUNTER — Other Ambulatory Visit: Payer: Self-pay | Admitting: Family Medicine

## 2012-07-15 ENCOUNTER — Other Ambulatory Visit: Payer: Self-pay | Admitting: Family Medicine

## 2012-07-18 ENCOUNTER — Other Ambulatory Visit: Payer: Self-pay | Admitting: Family Medicine

## 2012-07-18 NOTE — Telephone Encounter (Signed)
Pt is needing to talk with dr Katrinka Blazing about increasing her medication for her stomach   Best number [phone]

## 2012-07-20 NOTE — Telephone Encounter (Signed)
Pt reports that some days the 40 mg of pantoprazole is effective enough, but some days she still has stomach discomfort. Pt wanted to ask Dr Katrinka Blazing if she can take 2 tabs QD or on the days she is having more trouble. D/W her possibilities of other medications. Omeprazole is not effective. She has used Dexilant in past which was very effective but her co-pay is too expensive. Pt has also tried Nexium in past and it also was more effective. She is willing to try it and see how much her insurance would pay for it if Dr. Katrinka Blazing prefers this. Also, she used Zantac years ago w/good effectiveness and maybe a combo of this w/PPI would work? Pt does have appt w/GI on July 19th and will also discuss w/them at that time.

## 2012-07-20 NOTE — Telephone Encounter (Signed)
Would recommend adding Zantac 150mg  qhs and continue Protonix 40mg  once daily in am.  Recommend trying this until appointment with GI on 08/12/12.

## 2012-07-20 NOTE — Telephone Encounter (Signed)
Advised pt. Pt agreed.

## 2012-07-31 ENCOUNTER — Other Ambulatory Visit: Payer: Self-pay | Admitting: Family Medicine

## 2012-08-24 ENCOUNTER — Other Ambulatory Visit: Payer: Self-pay | Admitting: Family Medicine

## 2012-08-25 ENCOUNTER — Other Ambulatory Visit: Payer: Self-pay | Admitting: Family Medicine

## 2012-08-26 NOTE — Telephone Encounter (Signed)
Forward to Dr. Smith. 

## 2012-08-27 ENCOUNTER — Other Ambulatory Visit: Payer: Self-pay | Admitting: Radiology

## 2012-08-30 ENCOUNTER — Telehealth: Payer: Self-pay | Admitting: Radiology

## 2012-08-30 NOTE — Telephone Encounter (Signed)
[facility] needed labs. Have faxed [phone]

## 2012-09-04 ENCOUNTER — Other Ambulatory Visit: Payer: Self-pay | Admitting: Family Medicine

## 2012-09-05 NOTE — Telephone Encounter (Signed)
Forward to Dr. Katrinka Blazing

## 2012-09-07 ENCOUNTER — Other Ambulatory Visit: Payer: Self-pay | Admitting: Family Medicine

## 2012-09-08 ENCOUNTER — Ambulatory Visit: Payer: Self-pay | Admitting: Unknown Physician Specialty

## 2012-09-08 HISTORY — PX: OTHER SURGICAL HISTORY: SHX169

## 2012-09-08 HISTORY — PX: ESOPHAGOGASTRODUODENOSCOPY: SHX1529

## 2012-09-09 ENCOUNTER — Telehealth: Payer: Self-pay

## 2012-09-09 NOTE — Telephone Encounter (Signed)
Spoke with pharmacist, I called in Rx for pt since it didn't go through on the computer.

## 2012-09-09 NOTE — Telephone Encounter (Signed)
THIS CALL WAS FROM [facility] ON [address].  THE PATIENT STATES DR. Katrinka Blazing WAS TO CALL IN 2 PRESCRIPTIONS FOR HER. THEY ARE TRAMADOL 50MG  AND XANAX .5MG . THE PHARMACY IS CALLING TO FOLLOW-UP.  BEST PHONE IS [facility] [phone].  MBC

## 2012-09-20 ENCOUNTER — Encounter: Payer: Self-pay | Admitting: Family Medicine

## 2012-09-20 ENCOUNTER — Other Ambulatory Visit: Payer: Self-pay | Admitting: Family Medicine

## 2012-09-20 ENCOUNTER — Ambulatory Visit (INDEPENDENT_AMBULATORY_CARE_PROVIDER_SITE_OTHER): Payer: No Typology Code available for payment source | Admitting: Family Medicine

## 2012-09-20 VITALS — BP 118/70 | HR 97 | Temp 98.4°F | Resp 16 | Ht 65.5 in | Wt 237.0 lb

## 2012-09-20 DIAGNOSIS — G43909 Migraine, unspecified, not intractable, without status migrainosus: Secondary | ICD-10-CM

## 2012-09-20 DIAGNOSIS — F341 Dysthymic disorder: Secondary | ICD-10-CM

## 2012-09-20 DIAGNOSIS — D509 Iron deficiency anemia, unspecified: Secondary | ICD-10-CM

## 2012-09-20 DIAGNOSIS — F418 Other specified anxiety disorders: Secondary | ICD-10-CM

## 2012-09-20 MED ORDER — HYDROCODONE-ACETAMINOPHEN 5-325 MG PO TABS: 1.0000 | ORAL_TABLET | Freq: Four times a day (QID) | ORAL | Status: DC | PRN

## 2012-09-20 MED ORDER — PANTOPRAZOLE SODIUM 40 MG PO TBEC: 40.0000 mg | DELAYED_RELEASE_TABLET | Freq: Two times a day (BID) | ORAL | Status: DC

## 2012-09-20 MED ORDER — ROSUVASTATIN CALCIUM 20 MG PO TABS: 20.0000 mg | ORAL_TABLET | Freq: Every day | ORAL | Status: DC

## 2012-09-20 NOTE — Progress Notes (Signed)
[address]   [place], [place]  [address]   [phone]  Subjective:    Patient ID: Bethany Scott, female    DOB: April 03, 1955, 57 y.o.   MRN: [id number]  HPI This 57 y.o. female presents for three month follow-up:    1. Anemia: s/p EGD and colonoscopy with no source of bleeding.    Suggested that bleeding from diverticula could have caused bleeding.  Recommended increasing Protonix to bid.    2.  GERD:  +HH by EGD; increased Protonix to bid per GI.    3. Anxiety and depression: Xanax once weekly on average; doing well emotionally at this time; no longer angry with husband regarding divorce; still wants to remain married; husband working really hard on marriage.  Son and granddaughter are moving to [place] for new job; coping well with move.  Work is good. Doing well.  4. Allergic Rhinitis: s/p evaluation by [name] in past three months; she recommended immunological labs to look for immune deficiency but patient did not complete labs.  Insurance will not pay for Dymista; thus [name] switched to Fluticasone and Astelin.    5.  Migraines: horrible lately.  Taking Ultram more when stress headaches located in neck and occipital region; no nausea with stress headaches.  Using hydrocodone with migraines; using Phenergan.  Several migraines in a month.  Having 12 headaches per month migraine status.  I storm comes, headache severe.  S/p headache specialist; Meryl Crutch for years; no good regimen that worked; was previously maintained on ergotamine combination and took off the market.     Review of Systems  Constitutional: Negative for fever, chills, diaphoresis and fatigue.  HENT: Negative for congestion, rhinorrhea and postnasal drip.   Eyes: Negative for photophobia and visual disturbance.  Respiratory: Negative for cough, shortness of breath and stridor.   Gastrointestinal: Negative for nausea, vomiting, abdominal pain, diarrhea, constipation, blood in stool, abdominal distention, anal bleeding  and rectal pain.  Endocrine: Negative for polydipsia, polyphagia and polyuria.  Neurological: Positive for headaches. Negative for dizziness, tremors, seizures, syncope, facial asymmetry, speech difficulty, weakness, light-headedness and numbness.  Hematological: Negative for adenopathy. Does not bruise/bleed easily.  Psychiatric/Behavioral: Negative for dysphoric mood. The patient is not nervous/anxious.    Past Medical History  Diagnosis Date  . Allergy   . Anxiety   . Asthma   . Depression   . Diabetes mellitus without complication   . GERD (gastroesophageal reflux disease)   . Hyperlipidemia   . Migraine   . Lipoma of unspecified site   . Elevated blood pressure reading without diagnosis of hypertension   . Other symptoms involving cardiovascular system   . Gastric ulcer, unspecified as acute or chronic, without mention of hemorrhage, perforation, or obstruction   . Personal history of colonic polyps   . Other chronic cystitis   . Obesity, unspecified   . Unspecified vitamin D deficiency   . Arthritis   . Hypertension    Past Surgical History  Procedure Laterality Date  . Cesarean section      x 2  . Tonsillectomy and adenoidectomy    . Partial vaginal hysterectomy  1995  . Sigmoid resection / rectopexy    . Bilateral oophorectomy  2001  . Neck surgery    . Nasal septum surgery    . Spine surgery    . Appendectomy    . Colon surgery    . Abdominal hysterectomy    . Rheumatology consult  11/26/2011    multiple  arthralgias. Autoimmune labs negative.  Rx for Plaquenil for non-specific autoimmune process.  Lavenia Atlas.   Current Outpatient Prescriptions on File Prior to Visit  Medication Sig Dispense Refill  . ALPRAZolam (XANAX) 0.5 MG tablet TAKE 1 TABLET BY MOUTH EVERY DAY AS NEEDED FOR ANXIETY  30 tablet  2  . baclofen (LIORESAL) 10 MG tablet Take 1 tablet (10 mg total) by mouth 2 (two) times daily.  30 each  0  . baclofen (LIORESAL) 10 MG tablet TAKE 1 TABLET BY  MOUTH 2 TIMES DAILY.  45 tablet  3  . cetirizine (ZYRTEC) 10 MG tablet Take 10 mg by mouth daily.      . cholecalciferol (VITAMIN D) 1000 UNITS tablet Take 1,000 Units by mouth daily.      . ferrous sulfate 325 (65 FE) MG tablet Take 325 mg by mouth daily with breakfast.      . Fluticasone-Salmeterol (ADVAIR DISKUS) 100-50 MCG/DOSE AEPB Inhale 1 puff into the lungs every 12 (twelve) hours.      . hydroxychloroquine (PLAQUENIL) 200 MG tablet Take 200 mg by mouth 2 (two) times daily.       [name] levofloxacin (LEVAQUIN) 750 MG tablet Take 1 tablet (750 mg total) by mouth daily.  10 tablet  0  . lisinopril (PRINIVIL,ZESTRIL) 5 MG tablet TAKE 1 TABLET (5 MG TOTAL) BY MOUTH DAILY.  30 tablet  5  . montelukast (SINGULAIR) 10 MG tablet TAKE 1 TABLET BY MOUTH DAILY  30 tablet  4  . Multiple Vitamin (MULTIVITAMIN) capsule Take 1 capsule by mouth daily.      . promethazine (PHENERGAN) 25 MG tablet TAKE 1 TABLET BY MOUTH EVERY 6 HOURS AS NEEDED  40 tablet  3  . rizatriptan (MAXALT) 10 MG tablet Take 10 mg by mouth as needed. May repeat in 2 hours if needed      . traMADol (ULTRAM) 50 MG tablet TAKE 1 TABLET BY MOUTH EVERY 8 HOURS AS NEEDED FOR PAIN  45 tablet  3  . meloxicam (MOBIC) 15 MG tablet Take 1 tablet (15 mg total) by mouth daily.  30 tablet  0   No current facility-administered medications on file prior to visit.       Objective:   Physical Exam  Nursing note and vitals reviewed. Constitutional: She is oriented to person, place, and time. She appears well-developed and well-nourished. No distress.  HENT:  Head: Normocephalic and atraumatic.  Mouth/Throat: Oropharynx is clear and moist.  Eyes: Conjunctivae are normal. Pupils are equal, round, and reactive to light.  Neck: Normal range of motion. Neck supple. No thyromegaly present.  Cardiovascular: Normal rate, regular rhythm and normal heart sounds.  Exam reveals no gallop and no friction rub.   No murmur heard. Pulmonary/Chest: Effort normal  and breath sounds normal. She has no wheezes. She has no rales.  Abdominal: Soft. Bowel sounds are normal. She exhibits no distension and no mass. There is no tenderness. There is no rebound and no guarding.  Lymphadenopathy:    She has no cervical adenopathy.  Neurological: She is alert and oriented to person, place, and time. No cranial nerve deficit. She exhibits normal muscle tone. Coordination normal.  Skin: Skin is warm and dry. No rash noted. She is not diaphoretic. No erythema.  Psychiatric: She has a normal mood and affect. Her behavior is normal.       Assessment & Plan:  Iron deficiency anemia, unspecified - Plan: CBC with Differential, Comprehensive metabolic panel, Ferritin, Iron and TIBC  Depression with anxiety  Migraines   1.  Iron deficiency anemia: stable; s/p EGD and colonoscopy due to hemoccult + stools with worsening anemia.  Repeat labs today; asymptomatic. 2.  Depression with anxiety: stable despite multiple family stressors.  No change in therapy at this time. 3.  Migraines: uncontrolled; refill of Hydrocodone provided. 4.  GERD/HH:  Uncontrolled; increase Protonix to bid.  Recommend weight loss; weight down 13 pounds in past year.  Meds ordered this encounter  Medications  . rosuvastatin (CRESTOR) 20 MG tablet    Sig: Take 1 tablet (20 mg total) by mouth daily.    Dispense:  30 tablet    Refill:  11  . pantoprazole (PROTONIX) 40 MG tablet    Sig: Take 1 tablet (40 mg total) by mouth 2 (two) times daily.    Dispense:  60 tablet    Refill:  11  . HYDROcodone-acetaminophen (NORCO/VICODIN) 5-325 MG per tablet    Sig: Take 1 tablet by mouth every 6 (six) hours as needed for pain.    Dispense:  45 tablet    Refill:  3

## 2012-09-21 ENCOUNTER — Encounter: Payer: Self-pay | Admitting: Family Medicine

## 2012-09-21 LAB — COMPREHENSIVE METABOLIC PANEL
Albumin: 4.5 g/dL (ref 3.5–5.2)
Alkaline Phosphatase: 62 U/L (ref 39–117)
BUN: 14 mg/dL (ref 6–23)
CO2: 27 mEq/L (ref 19–32)
Glucose, Bld: 90 mg/dL (ref 70–99)
Total Bilirubin: 0.4 mg/dL (ref 0.3–1.2)
Total Protein: 7 g/dL (ref 6.0–8.3)

## 2012-09-21 LAB — IRON AND TIBC
Iron: 68 ug/dL (ref 42–145)
UIBC: 299 ug/dL (ref 125–400)

## 2012-09-21 LAB — CBC WITH DIFFERENTIAL/PLATELET
Basophils Absolute: 0 10*3/uL (ref 0.0–0.1)
Basophils Relative: 1 % (ref 0–1)
Lymphocytes Relative: 41 % (ref 12–46)
MCHC: 34 g/dL (ref 30.0–36.0)
Neutro Abs: 4 10*3/uL (ref 1.7–7.7)
Neutrophils Relative %: 47 % (ref 43–77)
Platelets: 340 10*3/uL (ref 150–400)
RDW: 14.8 % (ref 11.5–15.5)
WBC: 8.4 10*3/uL (ref 4.0–10.5)

## 2012-09-21 LAB — FERRITIN: Ferritin: 40 ng/mL (ref 10–291)

## 2012-09-26 ENCOUNTER — Other Ambulatory Visit: Payer: Self-pay | Admitting: Family Medicine

## 2012-09-27 ENCOUNTER — Telehealth: Payer: Self-pay

## 2012-09-27 NOTE — Telephone Encounter (Signed)
Called they should not cancel the refills. Left message for the pharmacy. Patient advised.

## 2012-09-27 NOTE — Telephone Encounter (Signed)
Pt had 3 refills left for tramadol but the pharmacy said it has become controlled,so they will not refill it.  Can it be called in? Call at [phone].

## 2012-10-19 ENCOUNTER — Other Ambulatory Visit: Payer: Self-pay | Admitting: Physician Assistant

## 2012-10-23 ENCOUNTER — Encounter: Payer: Self-pay | Admitting: Family Medicine

## 2012-10-31 ENCOUNTER — Telehealth: Payer: Self-pay

## 2012-10-31 MED ORDER — HYDROCODONE-ACETAMINOPHEN 5-325 MG PO TABS: 1.0000 | ORAL_TABLET | Freq: Two times a day (BID) | ORAL | Status: DC | PRN

## 2012-10-31 NOTE — Telephone Encounter (Signed)
Dr Michaelle Copas

## 2012-10-31 NOTE — Telephone Encounter (Signed)
Please advise 

## 2012-10-31 NOTE — Telephone Encounter (Signed)
Sorry, resent to correct provider

## 2012-10-31 NOTE — Telephone Encounter (Signed)
1.  Call --- I am very concerned with the amount of hydrocodone and Tramadol that patient is needing. Please have patient schedule OV with me in upcoming month to discuss in detail. 2.  Please call in refill of hydrocodone to pharmacy.

## 2012-10-31 NOTE — Telephone Encounter (Signed)
Dr. Katrinka Blazing - Patient wants to know if you can refill her vicodin for her migraines.  She has made an appointment with you to have her CPE in December.  [phone]

## 2012-11-01 ENCOUNTER — Telehealth: Payer: Self-pay | Admitting: Radiology

## 2012-11-01 MED ORDER — HYDROCODONE-ACETAMINOPHEN 5-325 MG PO TABS: 1.0000 | ORAL_TABLET | Freq: Two times a day (BID) | ORAL | Status: DC | PRN

## 2012-11-01 NOTE — Telephone Encounter (Signed)
I phoned in the hydrocodone , but then realized this now has to be printed and picked up by the patient, please sign Rx at desk.

## 2012-11-01 NOTE — Telephone Encounter (Signed)
Called in for her, called patient to advise she needs to come in this month for this. No answer.

## 2012-11-01 NOTE — Telephone Encounter (Signed)
Pt would like for someone to call her about her prescription she had a call earlier Call back number is [phone]

## 2012-11-02 MED ORDER — HYDROCODONE-ACETAMINOPHEN 5-325 MG PO TABS: 1.0000 | ORAL_TABLET | Freq: Two times a day (BID) | ORAL | Status: DC | PRN

## 2012-11-02 NOTE — Telephone Encounter (Signed)
Noted. Will sign when I get to [facility].  Thanks!

## 2012-11-02 NOTE — Telephone Encounter (Signed)
Spoke with pt advised we are waiting on a signature.

## 2012-11-02 NOTE — Telephone Encounter (Signed)
I have printed this for her, you will need to sign so she can pick up, we can no longer call this in.

## 2012-11-02 NOTE — Telephone Encounter (Signed)
Pt calling back about rx. Has given permission for her son Bethany Scott to pick it up.

## 2012-11-03 ENCOUNTER — Other Ambulatory Visit: Payer: Self-pay | Admitting: Family Medicine

## 2012-11-03 NOTE — Telephone Encounter (Signed)
Please phone in refill of Ultram.  Thanks.

## 2012-11-06 NOTE — Telephone Encounter (Signed)
Thanks. Phoned in.

## 2012-11-15 ENCOUNTER — Ambulatory Visit (INDEPENDENT_AMBULATORY_CARE_PROVIDER_SITE_OTHER): Payer: No Typology Code available for payment source | Admitting: Family Medicine

## 2012-11-15 VITALS — BP 126/78 | HR 84 | Temp 97.5°F | Resp 18 | Wt 239.0 lb

## 2012-11-15 DIAGNOSIS — J069 Acute upper respiratory infection, unspecified: Secondary | ICD-10-CM

## 2012-11-15 DIAGNOSIS — M25569 Pain in unspecified knee: Secondary | ICD-10-CM

## 2012-11-15 DIAGNOSIS — M7062 Trochanteric bursitis, left hip: Secondary | ICD-10-CM

## 2012-11-15 DIAGNOSIS — M76899 Other specified enthesopathies of unspecified lower limb, excluding foot: Secondary | ICD-10-CM

## 2012-11-15 DIAGNOSIS — E7800 Pure hypercholesterolemia, unspecified: Secondary | ICD-10-CM

## 2012-11-15 DIAGNOSIS — D509 Iron deficiency anemia, unspecified: Secondary | ICD-10-CM

## 2012-11-15 DIAGNOSIS — M25559 Pain in unspecified hip: Secondary | ICD-10-CM

## 2012-11-15 DIAGNOSIS — R7309 Other abnormal glucose: Secondary | ICD-10-CM

## 2012-11-15 LAB — POCT CBC
Lymph, poc: 1.5 (ref 0.6–3.4)
MCH, POC: 29.4 pg (ref 27–31.2)
MCHC: 32.1 g/dL (ref 31.8–35.4)
MCV: 91.4 fL (ref 80–97)
MID (cbc): 0.3 (ref 0–0.9)
POC LYMPH PERCENT: 26.9 %L (ref 10–50)
Platelet Count, POC: 298 10*3/uL (ref 142–424)
RDW, POC: 13.5 %
WBC: 5.5 10*3/uL (ref 4.6–10.2)

## 2012-11-15 LAB — LIPID PANEL
Cholesterol: 180 mg/dL (ref 0–200)
Triglycerides: 156 mg/dL — ABNORMAL HIGH (ref <150)
VLDL: 31 mg/dL (ref 0–40)

## 2012-11-15 LAB — COMPREHENSIVE METABOLIC PANEL
AST: 12 U/L (ref 0–37)
Alkaline Phosphatase: 61 U/L (ref 39–117)
BUN: 9 mg/dL (ref 6–23)
Calcium: 9.9 mg/dL (ref 8.4–10.5)
Creat: 0.62 mg/dL (ref 0.50–1.10)
Total Bilirubin: 0.3 mg/dL (ref 0.3–1.2)
Total Protein: 7.3 g/dL (ref 6.0–8.3)

## 2012-11-15 MED ORDER — TRAMADOL HCL 50 MG PO TABS: 50.0000 mg | ORAL_TABLET | Freq: Four times a day (QID) | ORAL | Status: DC | PRN

## 2012-11-15 MED ORDER — CLARITHROMYCIN ER 500 MG PO TB24: 1000.0000 mg | ORAL_TABLET | Freq: Every day | ORAL | Status: DC

## 2012-11-15 NOTE — Patient Instructions (Signed)

## 2012-11-15 NOTE — Progress Notes (Signed)
[address]   [place], [place]  [address]   [phone]  Subjective:    Patient ID: Bethany Scott, female    DOB: [birth year], 57 y.o.   MRN: [id number]  HPI This 57 y.o. female presents for evaluation of the following:  1.  Arthralgias:  L hip pain and radiates into L lateral thigh; hurt all night; pain with laying down.  Very tender to palpation.  L buttocks tender.  Has knot in L hip area. Hands hurt; knees hurt.  Discussed doing MTX but deferred due to anemic. Tramadol use more for joint pains and muscle pains.  Taking Crestor sporadically; rarely taking it.   Taking Tramadol for muscle aches; takes hydrocodone for headaches.   Having a lot of thigh and hamstring pain; no upper body muscle pain.  Onset in past 2-3 weeks.  No back pain.  Only L buttocks pain. No calf tenderness.  No n/t.    2.  Anemia:  Presenting for follow-up of anemia.   3.  Sinus congestion: onset yesterday; +PND; no fever/chills/sweats.  Sinus pressure.  +hoarseness; +scratchy throat.  +rhinorrhea; +nasal congestion; two nasal sprays; taking Zyrtec daily.  No cough yet.     Review of Systems  Constitutional: Negative for fever, chills, diaphoresis and fatigue.  HENT: Positive for congestion, postnasal drip, rhinorrhea, sinus pressure and voice change. Negative for sore throat and trouble swallowing.   Respiratory: Negative for cough and shortness of breath.   Cardiovascular: Negative for chest pain, palpitations and leg swelling.  Gastrointestinal: Negative for nausea, vomiting, abdominal pain and diarrhea.  Musculoskeletal: Positive for arthralgias and myalgias.  Neurological: Positive for headaches. Negative for dizziness, facial asymmetry, weakness, light-headedness and numbness.  Hematological: Negative for adenopathy. Does not bruise/bleed easily.   Past Medical History  Diagnosis Date  . Allergy   . Anxiety   . Asthma   . Depression   . Diabetes mellitus without complication   . GERD  (gastroesophageal reflux disease)   . Hyperlipidemia   . Migraine   . Gastric ulcer, unspecified as acute or chronic, without mention of hemorrhage, perforation, or obstruction   . Personal history of colonic polyps   . Other chronic cystitis   . Obesity, unspecified   . Hypertension   . Arthritis     OA Knee; non-specific autoimmune process followed by [facility]/Rheumatology.   Past Surgical History  Procedure Laterality Date  . Cesarean section      x 2  . Tonsillectomy and adenoidectomy    . Sigmoid resection / rectopexy    . Bilateral oophorectomy  2001  . Neck surgery    . Nasal septum surgery    . Spine surgery    . Appendectomy    . Colon surgery    . Rheumatology consult  11/26/2011    multiple arthralgias. Autoimmune labs negative.  Rx for Plaquenil for non-specific autoimmune process.  Lavenia Atlas.  . Esophagogastroduodenoscopy  09/08/2012    +HH.  [name].  Symptoms: anemia, hemoccult +.  . Colonoscopy  09/08/2012    diverticulosis, hemorrhoids.  [name].  Symptoms:  anemia, hemoccult +.  . Abdominal hysterectomy  01/25/1994    uterine fibroids; DUB; cervical dysplasia; ovaries resected two years later.     Allergies  Allergen Reactions  . Cephalosporins Anaphylaxis  . Penicillins Anaphylaxis       Objective:   Physical Exam  Constitutional: She is oriented to person, place, and time. She appears well-developed and well-nourished. No distress.  HENT:  Head: Normocephalic and atraumatic.  Right Ear: External ear normal.  Left Ear: External ear normal.  Nose: Mucosal edema and rhinorrhea present. Right sinus exhibits maxillary sinus tenderness and frontal sinus tenderness. Left sinus exhibits maxillary sinus tenderness and frontal sinus tenderness.  Mouth/Throat: Oropharynx is clear and moist.  Eyes: Conjunctivae and EOM are normal. Pupils are equal, round, and reactive to light.  Neck: Normal range of motion. Neck supple. Carotid bruit is not present. No  thyromegaly present.  Cardiovascular: Normal rate, regular rhythm, normal heart sounds and intact distal pulses.  Exam reveals no gallop and no friction rub.   No murmur heard. Pulmonary/Chest: Effort normal and breath sounds normal. She has no wheezes. She has no rales.  Abdominal: Soft. Bowel sounds are normal. She exhibits no distension and no mass. There is no tenderness. There is no rebound and no guarding.  Musculoskeletal:       Left hip: She exhibits decreased range of motion, decreased strength, tenderness and bony tenderness.       Lumbar back: Normal. She exhibits normal range of motion, no tenderness, no pain and no spasm.  Lymphadenopathy:    She has no cervical adenopathy.  Neurological: She is alert and oriented to person, place, and time. No cranial nerve deficit.  Skin: Skin is warm and dry. No rash noted. She is not diaphoretic. No erythema. No pallor.  Psychiatric: She has a normal mood and affect. Her behavior is normal.   Results for orders placed in visit on 11/15/12  COMPREHENSIVE METABOLIC PANEL      Result Value Ref Range   Sodium 140  135 - 145 mEq/L   Potassium 4.6  3.5 - 5.3 mEq/L   Chloride 104  96 - 112 mEq/L   CO2 29  19 - 32 mEq/L   Glucose, Bld 95  70 - 99 mg/dL   BUN 9  6 - 23 mg/dL   Creat 1.61  0.96 - 0.45 mg/dL   Total Bilirubin 0.3  0.3 - 1.2 mg/dL   Alkaline Phosphatase 61  39 - 117 U/L   AST 12  0 - 37 U/L   ALT 13  0 - 35 U/L   Total Protein 7.3  6.0 - 8.3 g/dL   Albumin 4.3  3.5 - 5.2 g/dL   Calcium 9.9  8.4 - 40.9 mg/dL  HEMOGLOBIN W1X      Result Value Ref Range   Hemoglobin A1C 6.0 (*) <5.7 %   Mean Plasma Glucose 126 (*) <117 mg/dL  LIPID PANEL      Result Value Ref Range   Cholesterol 180  0 - 200 mg/dL   Triglycerides 914 (*) <150 mg/dL   HDL 48  >78 mg/dL   Total CHOL/HDL Ratio 3.8     VLDL 31  0 - 40 mg/dL   LDL Cholesterol 295 (*) 0 - 99 mg/dL  POCT CBC      Result Value Ref Range   WBC 5.5  4.6 - 10.2 K/uL   Lymph, poc  1.5  0.6 - 3.4   POC LYMPH PERCENT 26.9  10 - 50 %L   MID (cbc) 0.3  0 - 0.9   POC MID % 5.1  0 - 12 %M   POC Granulocyte 3.7  2 - 6.9   Granulocyte percent 68.0  37 - 80 %G   RBC 4.94  4.04 - 5.48 M/uL   Hemoglobin 14.5  12.2 - 16.2 g/dL   HCT, POC 62.1  30.8 -  47.9 %   MCV 91.4  80 - 97 fL   MCH, POC 29.4  27 - 31.2 pg   MCHC 32.1  31.8 - 35.4 g/dL   RDW, POC 16.1     Platelet Count, POC 298  142 - 424 K/uL   MPV 8.0  0 - 99.8 fL  POCT SEDIMENTATION RATE      Result Value Ref Range   POCT SED RATE 32 (*) 0 - 22 mm/hr      Assessment & Plan:  Pure hypercholesterolemia - Plan: Comprehensive metabolic panel, Lipid panel  Other abnormal glucose - Plan: Comprehensive metabolic panel, Hemoglobin A1c  Chronic arthralgias of knees and hips, unspecified laterality - Plan: POCT SEDIMENTATION RATE  Iron deficiency anemia, unspecified - Plan: POCT CBC  Trochanteric bursitis, left  Acute URI  1. Hypercholesterolemia: stable; non-compliance with Crestor; repeat labs; if levels normal, will stop statin. 2. Glucose Intolerance/DMII: controlled with dietary modification. 3. Arthralgias: persistent and worsening; followed by rheumatology; obtain ESR. Follow-up rheumatology.  MTX may be option now that anemia has resolved.  Refill of Tramadol provided.  Very worried about Tramadol and Hydrocodone use.  Recommend stopping Hydrocodone and only using Tramadol; pt agreeable.   4.  L trochanteric bursitis: recurrent; recommend follow-up with rheumatology. 5.  Acute URI:  New.  Onset yesterday; recommend Flonase, Astelin, oral anti-histamine. If no improvement in one week, start abx/Biaxin. 6. Anemia: now resolved with iron supplementation. 7. Migraines: persistent; avoid further hydrocodone; use Tramadol only for migraines.  Meds ordered this encounter  Medications  . DISCONTD: clarithromycin (BIAXIN XL) 500 MG 24 hr tablet    Sig: Take 2 tablets (1,000 mg total) by mouth daily.    Dispense:   20 tablet    Refill:  0  . DISCONTD: traMADol (ULTRAM) 50 MG tablet    Sig: Take 1-2 tablets (50-100 mg total) by mouth every 6 (six) hours as needed for pain.    Dispense:  120 tablet    Refill:  3  . DISCONTD: traMADol (ULTRAM) 50 MG tablet    Sig: Take 1-2 tablets (50-100 mg total) by mouth every 6 (six) hours as needed for pain.    Dispense:  120 tablet    Refill:  3   Bethany Scott, M.D.  [facility] [address] [place], [place]  [address] [phone] phone [phone] fax

## 2012-11-20 ENCOUNTER — Other Ambulatory Visit: Payer: Self-pay | Admitting: Family Medicine

## 2012-11-22 ENCOUNTER — Encounter: Payer: Self-pay | Admitting: Family Medicine

## 2012-11-24 ENCOUNTER — Other Ambulatory Visit: Payer: Self-pay | Admitting: Family Medicine

## 2012-11-27 ENCOUNTER — Telehealth: Payer: Self-pay

## 2012-11-27 DIAGNOSIS — J0100 Acute maxillary sinusitis, unspecified: Secondary | ICD-10-CM

## 2012-11-27 MED ORDER — LEVOFLOXACIN 750 MG PO TABS: 750.0000 mg | ORAL_TABLET | Freq: Every day | ORAL | Status: DC

## 2012-11-27 NOTE — Telephone Encounter (Signed)
PT STATES SHE WAS GIVEN ROBAXIN AND ONCE IT WAS FINISHED HER SYMPTOMS CAME BACK, WOULD LIKE TO HAVE SOME LEVAQUIN CALLED IN . PLEASE CALL [phone]   [facility] [address] [facility] [place]

## 2012-11-27 NOTE — Telephone Encounter (Signed)
She took Biaxin. What are her current symptoms? Called her. She has post nasal drainage, sinus pressure, and sore throat. She has hoarseness with this also. She indicates she has not been coughing much, except from drainage. No fever, feels like everything is sinus related, please advise.

## 2012-11-27 NOTE — Telephone Encounter (Signed)
Pt.notified

## 2012-11-27 NOTE — Telephone Encounter (Signed)
Call --- rx for Levaquin sent to pharmacy.

## 2012-12-03 ENCOUNTER — Other Ambulatory Visit: Payer: Self-pay | Admitting: Family Medicine

## 2012-12-04 NOTE — Telephone Encounter (Signed)
Please phone in refills of Alprazolam as approved.

## 2012-12-05 NOTE — Telephone Encounter (Signed)
Called in.

## 2012-12-15 ENCOUNTER — Telehealth: Payer: Self-pay

## 2012-12-15 NOTE — Telephone Encounter (Signed)
Do you want her on the hydrocodone and the tramadol?

## 2012-12-15 NOTE — Telephone Encounter (Signed)
Dr. Katrinka Blazing  Patient is requesting vicodin.  States Dr. Katrinka Blazing wrote it for her recently.   [phone]

## 2012-12-19 NOTE — Telephone Encounter (Signed)
Call --- so we discussed at her last visit that she should not be taking BOTH the Tramadol and Hydrocodone.  Please clarify message.

## 2012-12-20 NOTE — Telephone Encounter (Signed)
Called and she states tramadol was not helpful for her headache, wanted vicodin, but now she indicates she is fine and does not need it.

## 2012-12-22 NOTE — Telephone Encounter (Signed)
Noted.  No further action warranted at this time. 

## 2012-12-28 ENCOUNTER — Other Ambulatory Visit: Payer: Self-pay | Admitting: Family Medicine

## 2012-12-29 ENCOUNTER — Other Ambulatory Visit: Payer: Self-pay | Admitting: Family Medicine

## 2013-01-02 ENCOUNTER — Encounter: Payer: Self-pay | Admitting: Family Medicine

## 2013-01-02 ENCOUNTER — Ambulatory Visit (INDEPENDENT_AMBULATORY_CARE_PROVIDER_SITE_OTHER): Payer: No Typology Code available for payment source | Admitting: Family Medicine

## 2013-01-02 VITALS — BP 134/82 | HR 79 | Temp 98.3°F | Resp 16 | Ht 67.5 in | Wt 230.0 lb

## 2013-01-02 DIAGNOSIS — E119 Type 2 diabetes mellitus without complications: Secondary | ICD-10-CM

## 2013-01-02 DIAGNOSIS — Z23 Encounter for immunization: Secondary | ICD-10-CM

## 2013-01-02 DIAGNOSIS — Z0000 Encounter for general adult medical examination without abnormal findings: Secondary | ICD-10-CM

## 2013-01-02 DIAGNOSIS — M25559 Pain in unspecified hip: Secondary | ICD-10-CM

## 2013-01-02 DIAGNOSIS — M25569 Pain in unspecified knee: Secondary | ICD-10-CM

## 2013-01-02 LAB — POCT URINALYSIS DIPSTICK
Bilirubin, UA: NEGATIVE
Glucose, UA: NEGATIVE
Leukocytes, UA: NEGATIVE
Nitrite, UA: NEGATIVE
Spec Grav, UA: >=1.03
Urobilinogen, UA: 0.2
pH, UA: 5.5

## 2013-01-02 LAB — POCT UA - MICROSCOPIC ONLY
Casts, Ur, LPF, POC: NEGATIVE
Crystals, Ur, HPF, POC: NEGATIVE

## 2013-01-02 LAB — MICROALBUMIN, URINE: Microalb, Ur: 1.24 mg/dL (ref 0.00–1.89)

## 2013-01-02 MED ORDER — TRAMADOL HCL 50 MG PO TABS: 50.0000 mg | ORAL_TABLET | Freq: Four times a day (QID) | ORAL | Status: DC | PRN

## 2013-01-02 NOTE — Progress Notes (Signed)
Subjective:    Patient ID: Bethany Scott, female    DOB: 1955/11/26, 57 y.o.   MRN: [id number]  HPI This 57 y.o. female presents for Complete Physical Examination.  Last physical 07/2011. Pap smear 07/2011. Mammogram 2013 at [place]. Colonoscopy 10/2012. TDAP 2008. Pneumovax 2012. Flu vaccine 10/2012. Hepatitis B series. Eye exam due; 2013; Eye [employer] in [place]; +glasses. Dental exam every six months.  Chronic arthralgias:  Stable on Tramadol; usually taking 4-8 tablets per day; due to follow-up with Dr. Gavin Potters of rheumatology next week.  Taking Plaquenil with persistent moving joint pains.  Frustrated by persistent daily pain.    Migraines: stable and slightly improved; called for hydrocodone rx after last visit for acute migraine that did not respond to Ultram, Maxalt, Phenergan.  Rx was denied; when received call regarding rx, headache had improved.  Review of Systems  Constitutional: Negative for fever, chills, diaphoresis, activity change, appetite change, fatigue and unexpected weight change.  HENT: Negative for congestion, drooling, ear discharge, ear pain, facial swelling, hearing loss, mouth sores, nosebleeds, postnasal drip, rhinorrhea, sinus pressure, sneezing, sore throat, tinnitus, trouble swallowing and voice change.   Eyes: Negative for photophobia, pain, discharge, redness, itching and visual disturbance.  Respiratory: Negative for apnea, cough, choking, chest tightness, shortness of breath, wheezing and stridor.   Cardiovascular: Negative for chest pain, palpitations and leg swelling.  Gastrointestinal: Negative for nausea, vomiting, abdominal pain, diarrhea, constipation, blood in stool, abdominal distention and anal bleeding.  Endocrine: Positive for heat intolerance. Negative for cold intolerance, polydipsia, polyphagia and polyuria.  Genitourinary: Positive for dyspareunia. Negative for dysuria, urgency, frequency, hematuria, flank pain, vaginal  bleeding, vaginal discharge, genital sores, vaginal pain and pelvic pain.  Musculoskeletal: Positive for arthralgias, back pain and myalgias. Negative for gait problem, neck pain and neck stiffness.  Skin: Positive for color change and rash. Negative for pallor and wound.       Rash B forearms; looks like bruises; +scaling in ear canal.  Neurological: Positive for headaches. Negative for dizziness, tremors, seizures, syncope, facial asymmetry, speech difficulty, weakness, light-headedness and numbness.  Hematological: Negative for adenopathy. Does not bruise/bleed easily.  Psychiatric/Behavioral: Negative for suicidal ideas, sleep disturbance, self-injury, dysphoric mood, decreased concentration and agitation. The patient is not nervous/anxious.       Past Medical History  Diagnosis Date  . Allergy   . Anxiety   . Asthma   . Depression   . Diabetes mellitus without complication   . GERD (gastroesophageal reflux disease)   . Hyperlipidemia   . Migraine   . Lipoma of unspecified site   . Elevated blood pressure reading without diagnosis of hypertension   . Other symptoms involving cardiovascular system   . Gastric ulcer, unspecified as acute or chronic, without mention of hemorrhage, perforation, or obstruction   . Personal history of colonic polyps   . Other chronic cystitis   . Obesity, unspecified   . Unspecified vitamin D deficiency   . Arthritis   . Hypertension    Past Surgical History  Procedure Laterality Date  . Cesarean section      x 2  . Tonsillectomy and adenoidectomy    . Partial vaginal hysterectomy  1995  . Sigmoid resection / rectopexy    . Bilateral oophorectomy  2001  . Neck surgery    . Nasal septum surgery    . Spine surgery    . Appendectomy    . Colon surgery    . Rheumatology consult  11/26/2011  multiple arthralgias. Autoimmune labs negative.  Rx for Plaquenil for non-specific autoimmune process.  Lavenia Atlas.  . Esophagogastroduodenoscopy   09/08/2012    +HH.  [name].  Symptoms: anemia, hemoccult +.  . Colonoscopy  09/08/2012    diverticulosis, hemorrhoids.  [name].  Symptoms:  anemia, hemoccult +.  . Abdominal hysterectomy  01/25/1994    uterine fibroids; DUB; cervical dysplasia; ovaries resected two years later.     Allergies  Allergen Reactions  . Cephalosporins Anaphylaxis  . Penicillins Anaphylaxis   Current Outpatient Prescriptions on File Prior to Visit  Medication Sig Dispense Refill  . ALPRAZolam (XANAX) 0.5 MG tablet TAKE 1 TABLET BY MOUTH EVERY DAY AS NEEDED FOR ANXIETY  30 tablet  3  . baclofen (LIORESAL) 10 MG tablet TAKE 1 TABLET BY MOUTH 2 TIMES DAILY.  45 tablet  5  . cetirizine (ZYRTEC) 10 MG tablet Take 10 mg by mouth daily.      . cholecalciferol (VITAMIN D) 1000 UNITS tablet Take 1,000 Units by mouth daily.      . ferrous sulfate 325 (65 FE) MG tablet Take 325 mg by mouth daily with breakfast.      . Fluticasone-Salmeterol (ADVAIR DISKUS) 100-50 MCG/DOSE AEPB Inhale 1 puff into the lungs every 12 (twelve) hours.      . hydroxychloroquine (PLAQUENIL) 200 MG tablet Take 200 mg by mouth 2 (two) times daily.       [name] lisinopril (PRINIVIL,ZESTRIL) 5 MG tablet TAKE 1 TABLET (5 MG TOTAL) BY MOUTH DAILY.  30 tablet  5  . lisinopril (PRINIVIL,ZESTRIL) 5 MG tablet TAKE 1 TABLET (5 MG TOTAL) BY MOUTH DAILY.  30 tablet  3  . meloxicam (MOBIC) 15 MG tablet Take 1 tablet (15 mg total) by mouth daily.  30 tablet  0  . montelukast (SINGULAIR) 10 MG tablet TAKE 1 TABLET BY MOUTH DAILY  30 tablet  8  . Multiple Vitamin (MULTIVITAMIN) capsule Take 1 capsule by mouth daily.      . pantoprazole (PROTONIX) 40 MG tablet Take 1 tablet (40 mg total) by mouth 2 (two) times daily.  60 tablet  11  . promethazine (PHENERGAN) 25 MG tablet TAKE 1 TABLET BY MOUTH EVERY 6 HOURS AS NEEDED  40 tablet  3  . rizatriptan (MAXALT) 10 MG tablet Take 10 mg by mouth as needed. May repeat in 2 hours if needed      . venlafaxine XR (EFFEXOR-XR) 150  MG 24 hr capsule TAKE 1 CAPSULE (150 MG TOTAL) BY MOUTH DAILY.  30 capsule  3  . rosuvastatin (CRESTOR) 20 MG tablet Take 1 tablet (20 mg total) by mouth daily.  30 tablet  11   No current facility-administered medications on file prior to visit.   History   Social History  . Marital Status: Married    Spouse Name: N/A    Number of Children: 2  . Years of Education: 12   Occupational History  . 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 Smoking status: Former Games developer  . Smokeless tobacco: Never Used     Comment: Quit 26 years ago  . Alcohol Use: Yes     Comment: rarley 3 times per year or less  . Drug Use: No  . Sexual Activity: Yes   Other Topics Concern  . Not on file   Social History Narrative   Marital status: married since 28 years; happily married; no abuse.  Children: 2 sons (21, 57); 1 granddaughter (18 months)      Lives: with husband.      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      Tobacco abuse:  Former smoker.      Alcohol:  none      Exercise: none   Family History  Problem Relation Age of Onset  . Bipolar disorder Father   . Transient ischemic attack Father   . Dementia Father     secondary to head trauma  . Hypertension Father   . Diabetes Father   . Cancer Mother 77    lung  . Migraines Mother   . Hypertension Sister   . Atrial fibrillation Sister   . Alcohol abuse Brother   . Hypertension Brother   . Alcohol abuse Brother     Objective:   Physical Exam  Nursing note and vitals reviewed. Constitutional: She is oriented to person, place, and time. She appears well-developed and well-nourished. No distress.  HENT:  Head: Normocephalic and atraumatic.  Right Ear: External ear normal.  Left Ear: External ear normal.  Nose: Nose normal.  Mouth/Throat: Oropharynx is clear and moist.  Slight scaling without erythema R ear canal.  Eyes: Conjunctivae and  EOM are normal. Pupils are equal, round, and reactive to light.  Neck: Normal range of motion. Neck supple. No thyromegaly present.  Well healed R anterior neck scar.  Cardiovascular: Normal rate, regular rhythm and normal heart sounds.  Exam reveals no gallop and no friction rub.   No murmur heard. Pulmonary/Chest: Effort normal and breath sounds normal. She has no wheezes. She has no rales.  Abdominal: Soft. Bowel sounds are normal. She exhibits no distension and no mass. There is no tenderness. There is no rebound and no guarding.  Genitourinary: Vagina normal. No breast swelling, tenderness, discharge or bleeding. Pelvic exam was performed with patient supine. There is no rash, tenderness or lesion on the right labia. There is no rash, tenderness or lesion on the left labia.  Lymphadenopathy:    She has no cervical adenopathy.  Neurological: She is alert and oriented to person, place, and time. No cranial nerve deficit. She exhibits normal muscle tone. Coordination normal.  Skin: Skin is warm and dry. No rash noted. She is not diaphoretic. No erythema. No pallor.  Scattered scant petechial rash L forearm 2 cm diameter.  Psychiatric: She has a normal mood and affect. Her behavior is normal.   EKG: NSR.  HEPATITIS B#1 ADMINISTERED DURING VISIT.  Results for orders placed in visit on 01/02/13  POCT URINALYSIS DIPSTICK      Result Value Range   Color, UA yellow     Clarity, UA clear     Glucose, UA neg     Bilirubin, UA neg     Ketones, UA neg     Spec Grav, UA >=1.030     Blood, UA small     pH, UA 5.5     Protein, UA neg     Urobilinogen, UA 0.2     Nitrite, UA neg     Leukocytes, UA Negative    POCT UA - MICROSCOPIC ONLY      Result Value Range   WBC, Ur, HPF, POC 0-2     RBC, urine, microscopic 0-4     Bacteria, U Microscopic neg     Mucus, UA small     Epithelial cells, urine per micros 0-4  Crystals, Ur, HPF, POC neg     Casts, Ur, LPF, POC neg     Yeast, UA neg           Assessment & Plan:  Routine general medical examination at a health care facility - Plan: Microalbumin, urine, POCT urinalysis dipstick, EKG 12-Lead, TSH, POCT UA - Microscopic Only  Type II or unspecified type diabetes mellitus without mention of complication, not stated as uncontrolled  Chronic arthralgias of knees and hips, unspecified laterality  Need for prophylactic vaccination and inoculation against viral hepatitis - Plan: Hepatitis B vaccine adult IM  Need for hepatitis B vaccination - Plan: Hepatitis B vaccine adult IM, Hepatitis B vaccine adult IM  1.  CPE: anticipatory guidance provided.  Pap smear UTD.  Clarify date of mammogram at [facility]. Colonoscopy UTD in 2014.  Immunizations --- s/p Hepatitis B#1.  Obtain labs.  2.  DMII: controlled with diet; obtain urine microalbumin; monofilament intact; recommend annual diabetic eye exam.  S/p Hepatitis B#1.   3.  Chronic Arthralgias:  Persistent/worsening; followed by Dr. Katrinka Blazing of ortho and Dr. Gavin Potters of Rheumatology; maintained on Plaquenil for non-specific autoimmune process; follow up with rheumatology next week.  Refill of Tramadol provided; to avoid hydrocodone. 4.  S/p Hepatitis B#1; RTC three months for Hepatitis B#2.  Meds ordered this encounter  Medications  . traMADol (ULTRAM) 50 MG tablet    Sig: Take 1-2 tablets (50-100 mg total) by mouth every 6 (six) hours as needed.    Dispense:  150 tablet    Refill:  3

## 2013-01-03 ENCOUNTER — Telehealth: Payer: Self-pay

## 2013-01-03 LAB — TSH: TSH: 0.734 u[IU]/mL (ref 0.350–4.500)

## 2013-01-03 NOTE — Telephone Encounter (Signed)
Please advise 

## 2013-01-03 NOTE — Telephone Encounter (Signed)
Discussed with patient

## 2013-01-03 NOTE — Telephone Encounter (Signed)
Please call patient --- I recommend limiting Tramadol to every six hours for now. Recommend she discussing persistent pain with rheumatology next week and see what he recommends for persistent pain.

## 2013-01-03 NOTE — Telephone Encounter (Signed)
Patient is requesting a change in directions on tramadol from take every six hours to take every four to six hours.   [facility] [place]   [facility]  [phone]

## 2013-01-08 ENCOUNTER — Telehealth: Payer: Self-pay

## 2013-01-08 NOTE — Telephone Encounter (Signed)
As discussed at recent visit last week, OK to use Hydrocodone for severe headaches that do not respond to Tramadol.  OK for rx for hydrocodone but will need to use sparingly as discussed at recent visit.  Patient will need to pick up rx for hydrocodone due to new guidelines and switch to Scheduled II drug classification.

## 2013-01-08 NOTE — Telephone Encounter (Signed)
Please advise, .patient has been advised to use tramadol, not hydrocodone and tramadol together.

## 2013-01-08 NOTE — Telephone Encounter (Signed)
Pt requesting rx for vicodin for her migraines(has had migraine all weekend)   Best phone for pt is [phone]    [facility] [address] [facility] [place]

## 2013-01-09 ENCOUNTER — Ambulatory Visit: Payer: Self-pay | Admitting: Specialist

## 2013-01-09 NOTE — Telephone Encounter (Signed)
Please advise, pended but unsure what quantity you would like.

## 2013-01-10 MED ORDER — HYDROCODONE-ACETAMINOPHEN 5-325 MG PO TABS: ORAL_TABLET | ORAL | Status: DC

## 2013-01-10 NOTE — Telephone Encounter (Signed)
I signed rx but will not be in office until 1:00pm; please confirm that rx printed and hold for me to sign.

## 2013-01-10 NOTE — Telephone Encounter (Signed)
I can not find this, did you sign?

## 2013-01-12 ENCOUNTER — Other Ambulatory Visit: Payer: Self-pay | Admitting: *Deleted

## 2013-01-12 MED ORDER — HYDROCODONE-ACETAMINOPHEN 5-325 MG PO TABS: ORAL_TABLET | ORAL | Status: DC

## 2013-01-12 NOTE — Telephone Encounter (Signed)
Signed and in box. 

## 2013-01-12 NOTE — Telephone Encounter (Signed)
Patient advised.

## 2013-01-17 ENCOUNTER — Other Ambulatory Visit: Payer: Self-pay | Admitting: Physician Assistant

## 2013-02-20 ENCOUNTER — Other Ambulatory Visit: Payer: Self-pay | Admitting: Physician Assistant

## 2013-03-18 ENCOUNTER — Other Ambulatory Visit: Payer: Self-pay | Admitting: Physician Assistant

## 2013-04-04 ENCOUNTER — Encounter: Payer: Self-pay | Admitting: Family Medicine

## 2013-04-04 ENCOUNTER — Ambulatory Visit (INDEPENDENT_AMBULATORY_CARE_PROVIDER_SITE_OTHER): Payer: No Typology Code available for payment source | Admitting: Family Medicine

## 2013-04-04 VITALS — BP 146/75 | HR 80 | Temp 97.8°F | Resp 16 | Ht 65.5 in | Wt 233.0 lb

## 2013-04-04 DIAGNOSIS — G43909 Migraine, unspecified, not intractable, without status migrainosus: Secondary | ICD-10-CM

## 2013-04-04 DIAGNOSIS — F3289 Other specified depressive episodes: Secondary | ICD-10-CM

## 2013-04-04 DIAGNOSIS — M25562 Pain in left knee: Secondary | ICD-10-CM

## 2013-04-04 DIAGNOSIS — M25552 Pain in left hip: Secondary | ICD-10-CM

## 2013-04-04 DIAGNOSIS — N390 Urinary tract infection, site not specified: Secondary | ICD-10-CM

## 2013-04-04 DIAGNOSIS — F418 Other specified anxiety disorders: Secondary | ICD-10-CM

## 2013-04-04 DIAGNOSIS — M25561 Pain in right knee: Secondary | ICD-10-CM

## 2013-04-04 DIAGNOSIS — G8929 Other chronic pain: Secondary | ICD-10-CM

## 2013-04-04 DIAGNOSIS — E7800 Pure hypercholesterolemia, unspecified: Secondary | ICD-10-CM

## 2013-04-04 DIAGNOSIS — Z23 Encounter for immunization: Secondary | ICD-10-CM

## 2013-04-04 DIAGNOSIS — F329 Major depressive disorder, single episode, unspecified: Secondary | ICD-10-CM

## 2013-04-04 DIAGNOSIS — E119 Type 2 diabetes mellitus without complications: Secondary | ICD-10-CM

## 2013-04-04 DIAGNOSIS — I10 Essential (primary) hypertension: Secondary | ICD-10-CM

## 2013-04-04 DIAGNOSIS — M25551 Pain in right hip: Secondary | ICD-10-CM

## 2013-04-04 LAB — CBC WITH DIFFERENTIAL/PLATELET
BASOS ABS: 0.1 10*3/uL (ref 0.0–0.1)
BASOS PCT: 1 % (ref 0–1)
EOS ABS: 0.2 10*3/uL (ref 0.0–0.7)
EOS PCT: 3 % (ref 0–5)
HCT: 41.6 % (ref 36.0–46.0)
Hemoglobin: 13.8 g/dL (ref 12.0–15.0)
Lymphocytes Relative: 32 % (ref 12–46)
Lymphs Abs: 1.9 10*3/uL (ref 0.7–4.0)
MCH: 29.7 pg (ref 26.0–34.0)
MCHC: 33.2 g/dL (ref 30.0–36.0)
MCV: 89.5 fL (ref 78.0–100.0)
Monocytes Absolute: 0.4 10*3/uL (ref 0.1–1.0)
Monocytes Relative: 7 % (ref 3–12)
Neutro Abs: 3.3 10*3/uL (ref 1.7–7.7)
Neutrophils Relative %: 57 % (ref 43–77)
PLATELETS: 298 10*3/uL (ref 150–400)
RBC: 4.65 MIL/uL (ref 3.87–5.11)
RDW: 14 % (ref 11.5–15.5)
WBC: 5.8 10*3/uL (ref 4.0–10.5)

## 2013-04-04 LAB — COMPLETE METABOLIC PANEL WITH GFR
ALK PHOS: 60 U/L (ref 39–117)
ALT: 14 U/L (ref 0–35)
AST: 15 U/L (ref 0–37)
Albumin: 4.2 g/dL (ref 3.5–5.2)
BUN: 11 mg/dL (ref 6–23)
CO2: 29 meq/L (ref 19–32)
CREATININE: 0.54 mg/dL (ref 0.50–1.10)
Calcium: 9.3 mg/dL (ref 8.4–10.5)
Chloride: 103 mEq/L (ref 96–112)
GFR, Est Non African American: >89 mL/min
Glucose, Bld: 115 mg/dL — ABNORMAL HIGH (ref 70–99)
Potassium: 3.8 mEq/L (ref 3.5–5.3)
Sodium: 141 mEq/L (ref 135–145)
Total Bilirubin: 0.3 mg/dL (ref 0.2–1.2)
Total Protein: 6.6 g/dL (ref 6.0–8.3)

## 2013-04-04 LAB — LIPID PANEL
CHOLESTEROL: 197 mg/dL (ref 0–200)
HDL: 47 mg/dL (ref >39)
LDL Cholesterol: 111 mg/dL — ABNORMAL HIGH (ref 0–99)
Total CHOL/HDL Ratio: 4.2 Ratio
Triglycerides: 195 mg/dL — ABNORMAL HIGH (ref <150)
VLDL: 39 mg/dL (ref 0–40)

## 2013-04-04 LAB — POCT URINALYSIS DIPSTICK
BILIRUBIN UA: NEGATIVE
Blood, UA: NEGATIVE
GLUCOSE UA: NEGATIVE
KETONES UA: NEGATIVE
Leukocytes, UA: NEGATIVE
Nitrite, UA: NEGATIVE
PROTEIN UA: NEGATIVE
SPEC GRAV UA: 1.01
Urobilinogen, UA: 0.2
pH, UA: 7

## 2013-04-04 LAB — POCT UA - MICROSCOPIC ONLY
Casts, Ur, LPF, POC: NEGATIVE
Crystals, Ur, HPF, POC: NEGATIVE
MUCUS UA: POSITIVE
Yeast, UA: NEGATIVE

## 2013-04-04 LAB — HEMOGLOBIN A1C
Hgb A1c MFr Bld: 5.4 % (ref <5.7)
Mean Plasma Glucose: 108 mg/dL (ref <117)

## 2013-04-04 MED ORDER — PROMETHAZINE HCL 25 MG PO TABS: 25.0000 mg | ORAL_TABLET | Freq: Four times a day (QID) | ORAL | Status: DC | PRN

## 2013-04-04 MED ORDER — HYDROCODONE-ACETAMINOPHEN 5-325 MG PO TABS: ORAL_TABLET | ORAL | Status: DC

## 2013-04-04 MED ORDER — LEVOFLOXACIN 750 MG PO TABS: 750.0000 mg | ORAL_TABLET | Freq: Every day | ORAL | Status: DC

## 2013-04-04 MED ORDER — TRAMADOL HCL 50 MG PO TABS: 50.0000 mg | ORAL_TABLET | Freq: Four times a day (QID) | ORAL | Status: DC | PRN

## 2013-04-04 NOTE — Progress Notes (Signed)
Subjective:   This chart was scribed for Bethany Honour, MD by Bethany Scott, [facility] Scribe. This patient was seen in room 21 and the patient's care was started 1:25 PM.     Patient ID: Bethany Scott, female    DOB: 1955-07-07, 58 y.o.   MRN: [id number]  04/04/2013  Follow-up, Depression, Anemia, Diabetes and Hyperlipidemia   HPI  HPI Comments: Bethany Scott is a 58 y.o. female who presents to [facility] here for a 3 month follow up for anemia, high blood pressure, glucose intolerance, hyperlipidemia, anxiety, depression, and migraines today. Pt last seen for full physical examination 3 months ago in office.  Pt states she is doing well since last visit.  Pt recently had an MRI per Dr. Tamala Julian on right knee for noticeable swelling with no acute findings other than OA. She denies any trauma or injury to her knee. Pt states she has not noted any improvements since last MRI. However, pt proceeded to seek further evaluation from Dr. [facility]/Rheumatology. MRI was reviewed, +bakers cyst found. Pt reports receiving a cortisone shot about 2 weeks ago by Dr. Jefm Bryant with much noticeably improvement. Pt has also tried 3 synovial injections and acupuncture for her current knee discomfort.  Dr. Jefm Bryant recommended a second opinion regarding OA knee; thus, patient has an appointment with Dr. Clydell Hakim PA.  She reports current intermittent dysuria described as pressure onset a few months. She has been taking Macrobid with noticeable improvement, but has still noted ongoing reoccurrence.  Previously followed by Dr. Jacqlyn Larsen of Urology for recurrent cystitis; previously used Macrobid post-coitally.  Mild dysuria but more bladder pressure and urgency.  Symptoms improve with Macrobid and Pyridium.  She reports intermittent, moderate migraines after using an estrogen vaginal cream. However, otherwise, she states her migraines have decreased significantly since last visit.  Pt stopped estrogen  cream due to increase in migraines with aura.  She is requesting Levaquin for her allergies and evolving sinus congestion that are beginning to flare up. She is currently using Flonase and Astepro with improvement and decreased frequency of sinusitis.. No other complaints.  She is currently on Sulfasalazine200 mg and Celebrex with no known or current difficulty. She has stopped taking Plaquenil prescription when she started Sulfasalazine.  Diffuse joint pains are much improved with Sulfasalazine.  She currently suffers with R knee pain s/p steroid injection and L thumb pain which is known OA; she has received injections in L thumb by ortho but pain recurs.  She is taking Tramadol 3-4 times per day one tablet; she needs refill.  Overall she states emotionally she is doing well. However, she admits to some difficulty with her husband, but states this is related to some personal issues she is dealing with.  It is the year anniversary of her husband's infidelity.    At this time she denies checking her blood sugar or blood pressure regularly.  She is due for Hepatitis B#2 due to DMII/glucose intolerance.  Denies any CP, SOB, fever, or chills.  Review of Systems  Constitutional: Negative for fever and chills.  HENT: Positive for congestion, postnasal drip, rhinorrhea and voice change. Negative for sinus pressure, sore throat and trouble swallowing.   Eyes: Positive for visual disturbance. Negative for redness.  Respiratory: Negative for cough, shortness of breath, wheezing and stridor.   Cardiovascular: Negative for chest pain, palpitations and leg swelling.  Gastrointestinal: Negative for nausea, vomiting, abdominal pain, diarrhea and constipation.  Endocrine: Negative for polydipsia, polyphagia  and polyuria.  Genitourinary: Positive for dysuria and urgency. Negative for hematuria, vaginal discharge, genital sores and vaginal pain.  Musculoskeletal: Positive for arthralgias and joint swelling.  Negative for back pain and neck pain.  Skin: Negative for rash.  Allergic/Immunologic: Positive for environmental allergies.  Neurological: Positive for headaches. Negative for dizziness, tremors, speech difficulty, weakness, light-headedness and numbness.  Psychiatric/Behavioral: Negative for confusion and dysphoric mood. The patient is not nervous/anxious.     Past Medical History  Diagnosis Date  . Allergy   . Anxiety   . Asthma   . Depression   . Diabetes mellitus without complication   . GERD (gastroesophageal reflux disease)   . Hyperlipidemia   . Migraine   . Gastric ulcer, unspecified as acute or chronic, without mention of hemorrhage, perforation, or obstruction   . Personal history of colonic polyps   . Other chronic cystitis   . Obesity, unspecified   . Hypertension   . Arthritis     OA Knee; non-specific autoimmune process followed by Duard [facility]/Rheumatology.   Allergies  Allergen Reactions  . Cephalosporins Anaphylaxis  . Penicillins Anaphylaxis   Current Outpatient Prescriptions  Medication Sig Dispense Refill  . ALPRAZolam (XANAX) 0.5 MG tablet TAKE 1 TABLET BY MOUTH EVERY DAY AS NEEDED FOR ANXIETY  30 tablet  3  . baclofen (LIORESAL) 10 MG tablet TAKE 1 TABLET BY MOUTH 2 TIMES DAILY.  45 tablet  5  . celecoxib (CELEBREX) 100 MG capsule Take 100 mg by mouth daily.      . cetirizine (ZYRTEC) 10 MG tablet Take 10 mg by mouth daily.      . cholecalciferol (VITAMIN D) 1000 UNITS tablet Take 1,000 Units by mouth daily.      . ferrous sulfate 325 (65 FE) MG tablet Take 325 mg by mouth daily with breakfast.      . Fluticasone-Salmeterol (ADVAIR DISKUS) 100-50 MCG/DOSE AEPB Inhale 1 puff into the lungs every 12 (twelve) hours.      [name] HYDROcodone-acetaminophen (NORCO/VICODIN) 5-325 MG per tablet Take 1 tablet every 8 hours as needed for headache, use sparingly.  40 tablet  0  . lisinopril (PRINIVIL,ZESTRIL) 5 MG tablet TAKE 1 TABLET (5 MG TOTAL) BY MOUTH DAILY.  30  tablet  5  . montelukast (SINGULAIR) 10 MG tablet TAKE 1 TABLET BY MOUTH DAILY  30 tablet  8  . Multiple Vitamin (MULTIVITAMIN) capsule Take 1 capsule by mouth daily.      . pantoprazole (PROTONIX) 40 MG tablet Take 1 tablet (40 mg total) by mouth 2 (two) times daily.  60 tablet  11  . promethazine (PHENERGAN) 25 MG tablet Take 1 tablet (25 mg total) by mouth every 6 (six) hours as needed for nausea or vomiting.  40 tablet  3  . rizatriptan (MAXALT) 10 MG tablet Take 10 mg by mouth as needed. May repeat in 2 hours if needed      . sulfaSALAzine (AZULFIDINE) 500 MG tablet Take 500 mg by mouth 4 (four) times daily.      . traMADol (ULTRAM) 50 MG tablet Take 1-2 tablets (50-100 mg total) by mouth every 6 (six) hours as needed.  150 tablet  3  . venlafaxine XR (EFFEXOR-XR) 150 MG 24 hr capsule TAKE 1 CAPSULE BY MOUTH DAILY.  30 capsule  3  . levofloxacin (LEVAQUIN) 750 MG tablet Take 1 tablet (750 mg total) by mouth daily.  10 tablet  0  . rosuvastatin (CRESTOR) 20 MG tablet Take 1 tablet (20 mg  total) by mouth daily.  30 tablet  11   No current facility-administered medications for this visit.   History   Social History  . Marital Status: Married    Spouse Name: N/A    Number of Children: 2  . Years of Education: 12   Occupational History  . 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 Smoking status: Former Research scientist (life sciences)  . Smokeless tobacco: Never Used     Comment: Quit 26 years ago  . Alcohol Use: Yes     Comment: rarley 3 times per year or less  . Drug Use: No  . Sexual Activity: Yes   Other Topics Concern  . Not on file   Social History Narrative   Marital status: married since 4 years; happily married; no abuse.      Children: 2 sons (21, 93); 1 granddaughter (32 months)      Lives: with husband.      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      Tobacco abuse:  Former smoker.      Alcohol:  none        Exercise: none       Objective:    BP 146/75  Pulse 80  Temp(Src) 97.8 F (36.6 C)  Resp 16  Ht 5' 5.5" (1.664 m)  Wt 233 lb (105.688 kg)  BMI 38.17 kg/m2  SpO2 97%  Physical Exam  Nursing note and vitals reviewed. Constitutional: She is oriented to person, place, and time. She appears well-developed and well-nourished. No distress.  HENT:  Head: Normocephalic and atraumatic.  Right Ear: External ear normal.  Left Ear: External ear normal.  Nose: Nose normal.  Mouth/Throat: Oropharynx is clear and moist.  Drainage present in OP.  Eyes: Conjunctivae and EOM are normal. Pupils are equal, round, and reactive to light.  Neck: Normal range of motion. Neck supple. No thyromegaly present.  Cardiovascular: Normal rate, regular rhythm, normal heart sounds and intact distal pulses.  Exam reveals no gallop and no friction rub.   No murmur heard. Pulmonary/Chest: Effort normal and breath sounds normal. No respiratory distress. She has no wheezes. She has no rales.  Abdominal: Soft. Bowel sounds are normal. She exhibits no distension and no mass. There is no tenderness. There is no rebound and no guarding.  Musculoskeletal: Normal range of motion. She exhibits no edema and no tenderness.  Lymphadenopathy:    She has no cervical adenopathy.  Neurological: She is alert and oriented to person, place, and time.  Skin: Skin is warm and dry. No rash noted. She is not diaphoretic.  Psychiatric: She has a normal mood and affect. Her behavior is normal. Judgment and thought content normal.   Results for orders placed in visit on 04/04/13  CBC WITH DIFFERENTIAL      Result Value Ref Range   WBC 5.8  4.0 - 10.5 K/uL   RBC 4.65  3.87 - 5.11 MIL/uL   Hemoglobin 13.8  12.0 - 15.0 g/dL   HCT 41.6  36.0 - 46.0 %   MCV 89.5  78.0 - 100.0 fL   MCH 29.7  26.0 - 34.0 pg   MCHC 33.2  30.0 - 36.0 g/dL   RDW 14.0  11.5 - 15.5 %   Platelets 298  150 - 400 K/uL   Neutrophils Relative % 57  43 - 77  %   Neutro Abs 3.3  1.7 - 7.7 K/uL   Lymphocytes Relative 32  12 - 46 %   Lymphs Abs 1.9  0.7 - 4.0 K/uL   Monocytes Relative 7  3 - 12 %   Monocytes Absolute 0.4  0.1 - 1.0 K/uL   Eosinophils Relative 3  0 - 5 %   Eosinophils Absolute 0.2  0.0 - 0.7 K/uL   Basophils Relative 1  0 - 1 %   Basophils Absolute 0.1  0.0 - 0.1 K/uL   Smear Review Criteria for review not met    COMPLETE METABOLIC PANEL WITH GFR      Result Value Ref Range   Sodium 141  135 - 145 mEq/L   Potassium 3.8  3.5 - 5.3 mEq/L   Chloride 103  96 - 112 mEq/L   CO2 29  19 - 32 mEq/L   Glucose, Bld 115 (*) 70 - 99 mg/dL   BUN 11  6 - 23 mg/dL   Creat 0.54  0.50 - 1.10 mg/dL   Total Bilirubin 0.3  0.2 - 1.2 mg/dL   Alkaline Phosphatase 60  39 - 117 U/L   AST 15  0 - 37 U/L   ALT 14  0 - 35 U/L   Total Protein 6.6  6.0 - 8.3 g/dL   Albumin 4.2  3.5 - 5.2 g/dL   Calcium 9.3  8.4 - 10.5 mg/dL   GFR, Est African American >89     GFR, Est Non African American >89    HEMOGLOBIN A1C      Result Value Ref Range   Hemoglobin A1C 5.4  <5.7 %   Mean Plasma Glucose 108  <117 mg/dL  LIPID PANEL      Result Value Ref Range   Cholesterol 197  0 - 200 mg/dL   Triglycerides 195 (*) <150 mg/dL   HDL 47  >39 mg/dL   Total CHOL/HDL Ratio 4.2     VLDL 39  0 - 40 mg/dL   LDL Cholesterol 111 (*) 0 - 99 mg/dL  POCT URINALYSIS DIPSTICK      Result Value Ref Range   Color, UA yellow     Clarity, UA clear     Glucose, UA neg     Bilirubin, UA neg     Ketones, UA neg     Spec Grav, UA 1.010     Blood, UA neg     pH, UA 7.0     Protein, UA neg     Urobilinogen, UA 0.2     Nitrite, UA neg     Leukocytes, UA Negative    POCT UA - MICROSCOPIC ONLY      Result Value Ref Range   WBC, Ur, HPF, POC 2-3     RBC, urine, microscopic 0-2     Bacteria, U Microscopic 2+     Mucus, UA pos     Epithelial cells, urine per micros 4-6     Crystals, Ur, HPF, POC neg     Casts, Ur, LPF, POC neg     Yeast, UA neg         Assessment &  Plan:  Type II or unspecified type diabetes mellitus without mention of complication, not stated as uncontrolled - Plan: Hemoglobin A1c, HM Diabetes Foot Exam, Hepatitis B vaccine adult IM  Pure hypercholesterolemia - Plan: Lipid panel  Unspecified essential hypertension - Plan: CBC with Differential, COMPLETE METABOLIC PANEL WITH GFR  Chronic arthralgias of knees and hips  Migraines  Depression with anxiety  Recurrent UTI - Plan: POCT urinalysis dipstick, POCT UA - Microscopic Only, Urine culture  Need for hepatitis B vaccination  1. DMII/glucose intolerance: controlled; no change in therapy; obtain labs; s/p Hepatitis B#2.  RTC three months for Hepatitis B#3. 2. HTN: controlled; obtain labs; no change in therapy. 3.  Hyperlipidemia: stable off of medication; repeat labs; continue with dietary modification. 4.  Depression with anxiety: stable and improved with recent change in family situations; doing well; no change to therapy. 5. Migraines: stable; worsened temporarily with use of vaginal estrogen cream thus cream d/c.  Refill of hydrocodone provided. 6.  Chronic arthralgias: persistent but improved with switch to Sulfasalazine.  Has appointment with [facility] regarding R knee OA.  7.  Recurrent UTIs: Recurrent; benign urine in office; send urine culture; if persists, recommend follow-up with [facility]. 8.  S/p Hepatitis B#2; RTC three months for Hepatitis B#3. 9. Allergic Rhinitis:  Worsening; continue Flonase and Astepro; rx for Levaquin provided to fill if clinically worsens.   Meds ordered this encounter  Medications  . celecoxib (CELEBREX) 100 MG capsule    Sig: Take 100 mg by mouth daily.  [name] sulfaSALAzine (AZULFIDINE) 500 MG tablet    Sig: Take 500 mg by mouth 4 (four) times daily.  [name] DISCONTD: HYDROcodone-acetaminophen (NORCO/VICODIN) 5-325 MG per tablet    Sig: Take 1 tablet every 8 hours as needed for headache, use sparingly.    Dispense:  40 tablet    Refill:  0  .  promethazine (PHENERGAN) 25 MG tablet    Sig: Take 1 tablet (25 mg total) by mouth every 6 (six) hours as needed for nausea or vomiting.    Dispense:  40 tablet    Refill:  3  . traMADol (ULTRAM) 50 MG tablet    Sig: Take 1-2 tablets (50-100 mg total) by mouth every 6 (six) hours as needed.    Dispense:  150 tablet    Refill:  3  . levofloxacin (LEVAQUIN) 750 MG tablet    Sig: Take 1 tablet (750 mg total) by mouth daily.    Dispense:  10 tablet    Refill:  0  . HYDROcodone-acetaminophen (NORCO/VICODIN) 5-325 MG per tablet    Sig: Take 1 tablet every 8 hours as needed for headache, use sparingly.    Dispense:  40 tablet    Refill:  0    Return in about 3 months (around 07/05/2013) for recheck high blood pressure, pre-diabetes, anxiety/depression, migraines..   I personally performed the services described in this documentation, which was scribed in my presence.  The recorded information has been reviewed and is accurate.  [name], M.D.  [facility] [address] [place], [place]  [address] [phone] phone [phone] fax

## 2013-04-05 ENCOUNTER — Encounter: Payer: Self-pay | Admitting: Family Medicine

## 2013-04-06 LAB — URINE CULTURE
Colony Count: NO GROWTH
Organism ID, Bacteria: NO GROWTH

## 2013-04-07 ENCOUNTER — Other Ambulatory Visit: Payer: Self-pay | Admitting: Family Medicine

## 2013-04-10 ENCOUNTER — Encounter: Payer: Self-pay | Admitting: Family Medicine

## 2013-04-11 ENCOUNTER — Encounter: Payer: Self-pay | Admitting: Family Medicine

## 2013-04-12 NOTE — Telephone Encounter (Signed)
Called in.

## 2013-04-12 NOTE — Telephone Encounter (Signed)
Please call in refill for Alprazolam as approved. 

## 2013-04-13 MED ORDER — PHENAZOPYRIDINE HCL 100 MG PO TABS: 100.0000 mg | ORAL_TABLET | Freq: Three times a day (TID) | ORAL | Status: DC | PRN

## 2013-04-13 MED ORDER — NITROFURANTOIN MONOHYD MACRO 100 MG PO CAPS: 100.0000 mg | ORAL_CAPSULE | Freq: Two times a day (BID) | ORAL | Status: DC | PRN

## 2013-04-25 MED ORDER — CLARITHROMYCIN 500 MG PO TABS: 500.0000 mg | ORAL_TABLET | Freq: Two times a day (BID) | ORAL | Status: DC

## 2013-04-25 NOTE — Addendum Note (Signed)
Addended by: [name] on: 04/25/2013 12:44 PM   Modules accepted: Orders

## 2013-05-11 ENCOUNTER — Encounter: Payer: Self-pay | Admitting: Family Medicine

## 2013-05-11 ENCOUNTER — Other Ambulatory Visit: Payer: Self-pay | Admitting: Physician Assistant

## 2013-05-11 MED ORDER — HYDROCORTISONE ACETATE 25 MG RE SUPP: 25.0000 mg | Freq: Two times a day (BID) | RECTAL | Status: DC | PRN

## 2013-05-12 ENCOUNTER — Other Ambulatory Visit: Payer: Self-pay | Admitting: Physician Assistant

## 2013-05-24 MED ORDER — HYDROCODONE-ACETAMINOPHEN 5-325 MG PO TABS: ORAL_TABLET | ORAL | Status: DC

## 2013-05-24 NOTE — Addendum Note (Signed)
Addended by: Theda Sers on: 05/24/2013 01:32 PM   Modules accepted: Orders

## 2013-05-24 NOTE — Telephone Encounter (Signed)
Rx printed and signed.  

## 2013-05-31 ENCOUNTER — Other Ambulatory Visit: Payer: Self-pay | Admitting: Family Medicine

## 2013-05-31 NOTE — Telephone Encounter (Signed)
Dr Tamala Julian, you saw pt for check up in March, but I don't see this med discussed. Do you want to RF?

## 2013-06-08 DIAGNOSIS — R7989 Other specified abnormal findings of blood chemistry: Secondary | ICD-10-CM | POA: Insufficient documentation

## 2013-06-11 ENCOUNTER — Encounter: Payer: Self-pay | Admitting: Family Medicine

## 2013-06-11 ENCOUNTER — Ambulatory Visit (INDEPENDENT_AMBULATORY_CARE_PROVIDER_SITE_OTHER): Payer: No Typology Code available for payment source | Admitting: Family Medicine

## 2013-06-11 ENCOUNTER — Other Ambulatory Visit: Payer: Self-pay | Admitting: Family Medicine

## 2013-06-11 VITALS — BP 124/70 | HR 90 | Temp 97.6°F | Resp 16 | Ht 66.0 in | Wt 234.8 lb

## 2013-06-11 DIAGNOSIS — I10 Essential (primary) hypertension: Secondary | ICD-10-CM

## 2013-06-11 DIAGNOSIS — M25551 Pain in right hip: Secondary | ICD-10-CM

## 2013-06-11 DIAGNOSIS — M25562 Pain in left knee: Secondary | ICD-10-CM

## 2013-06-11 DIAGNOSIS — E119 Type 2 diabetes mellitus without complications: Secondary | ICD-10-CM

## 2013-06-11 DIAGNOSIS — G8929 Other chronic pain: Secondary | ICD-10-CM

## 2013-06-11 DIAGNOSIS — M25552 Pain in left hip: Secondary | ICD-10-CM

## 2013-06-11 DIAGNOSIS — E7800 Pure hypercholesterolemia, unspecified: Secondary | ICD-10-CM

## 2013-06-11 DIAGNOSIS — M25561 Pain in right knee: Secondary | ICD-10-CM

## 2013-06-11 DIAGNOSIS — G43909 Migraine, unspecified, not intractable, without status migrainosus: Secondary | ICD-10-CM

## 2013-06-11 DIAGNOSIS — F418 Other specified anxiety disorders: Secondary | ICD-10-CM

## 2013-06-11 DIAGNOSIS — J069 Acute upper respiratory infection, unspecified: Secondary | ICD-10-CM

## 2013-06-11 LAB — COMPLETE METABOLIC PANEL WITH GFR
ALK PHOS: 62 U/L (ref 39–117)
ALT: 12 U/L (ref 0–35)
AST: 12 U/L (ref 0–37)
Albumin: 4.4 g/dL (ref 3.5–5.2)
BUN: 12 mg/dL (ref 6–23)
CALCIUM: 9.7 mg/dL (ref 8.4–10.5)
CHLORIDE: 101 meq/L (ref 96–112)
CO2: 27 meq/L (ref 19–32)
Creat: 0.64 mg/dL (ref 0.50–1.10)
Glucose, Bld: 135 mg/dL — ABNORMAL HIGH (ref 70–99)
Potassium: 4.1 mEq/L (ref 3.5–5.3)
SODIUM: 138 meq/L (ref 135–145)
TOTAL PROTEIN: 7.2 g/dL (ref 6.0–8.3)
Total Bilirubin: 0.3 mg/dL (ref 0.2–1.2)

## 2013-06-11 LAB — CBC WITH DIFFERENTIAL/PLATELET
BASOS PCT: 1 % (ref 0–1)
Basophils Absolute: 0.1 10*3/uL (ref 0.0–0.1)
Eosinophils Absolute: 0.1 10*3/uL (ref 0.0–0.7)
Eosinophils Relative: 2 % (ref 0–5)
HEMATOCRIT: 41.4 % (ref 36.0–46.0)
HEMOGLOBIN: 14.2 g/dL (ref 12.0–15.0)
LYMPHS ABS: 1.7 10*3/uL (ref 0.7–4.0)
LYMPHS PCT: 30 % (ref 12–46)
MCH: 30.4 pg (ref 26.0–34.0)
MCHC: 34.3 g/dL (ref 30.0–36.0)
MCV: 88.7 fL (ref 78.0–100.0)
MONO ABS: 0.5 10*3/uL (ref 0.1–1.0)
Monocytes Relative: 8 % (ref 3–12)
NEUTROS ABS: 3.4 10*3/uL (ref 1.7–7.7)
NEUTROS PCT: 59 % (ref 43–77)
Platelets: 289 10*3/uL (ref 150–400)
RBC: 4.67 MIL/uL (ref 3.87–5.11)
RDW: 12.5 % (ref 11.5–15.5)
WBC: 5.7 10*3/uL (ref 4.0–10.5)

## 2013-06-11 LAB — LIPID PANEL
Cholesterol: 215 mg/dL — ABNORMAL HIGH (ref 0–200)
HDL: 43 mg/dL (ref >39)
LDL CALC: 121 mg/dL — AB (ref 0–99)
TRIGLYCERIDES: 255 mg/dL — AB (ref <150)
Total CHOL/HDL Ratio: 5 Ratio
VLDL: 51 mg/dL — AB (ref 0–40)

## 2013-06-11 LAB — HEMOGLOBIN A1C
HEMOGLOBIN A1C: 6 % — AB (ref <5.7)
Mean Plasma Glucose: 126 mg/dL — ABNORMAL HIGH (ref <117)

## 2013-06-11 MED ORDER — VENLAFAXINE HCL ER 75 MG PO CP24: 225.0000 mg | ORAL_CAPSULE | Freq: Every day | ORAL | Status: DC

## 2013-06-11 MED ORDER — HYDROCODONE-ACETAMINOPHEN 5-325 MG PO TABS: ORAL_TABLET | ORAL | Status: DC

## 2013-06-11 NOTE — Progress Notes (Signed)
Subjective:    Patient ID: Bethany Scott, female    DOB: June 28, 1955, 58 y.o.   MRN: [id number]  HPI Chief Complaint  Patient presents with  . Diabetes  . Hypertension  . Depression   This chart was scribed for [name], MD by Thea Alken, ED Scribe. This patient was seen in room 28 and the patient's care was started at 1:45 PM.  HPI Comments: Bethany Scott is a 58 y.o. female who presents to the [facility] for a follow-up regarding DM, HTN, migraines depression, and anxiety. Pt is here a month early due to her  insurance renewing next month.Pt has been seen by Dr. Jefm Bryant of rheumatology in [place]. Pt had X-ray done on right hip due to decreased ROM. She reports she was given an injection for the pain Friday. She was also seen by Dr. Marry Guan for right knee pain due to arthritis and reports she is beginning to feel pain in left knee. 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 Does not warrant TKR yet.   Pt takes 2 Tramadol 2-3 times a day.  Pt is requesting to increase Effexor. Pt reports anxiousness and sadness. Pt states she has a lot going on at home. Pt children are still living at home with their children. Pt never has time to herself. Pt bad migraines for the past couple weeks due to weather changes. Pt denies checking BP regularly.   Pt c/o of bruising easily onset several months. Pt reports bruising on right arm.  43 year old grandchild is staying with patient currently.    S/p recent follow-up with Dr. Orvil Feil; she is requesting labs again that pt did not complete after last visit.  Pt has lab requisition with her today.  Pt denies CP, SOB, and bowel incontinence. Pt denies exercising.  Past Medical History  Diagnosis Date  . Allergy   . Anxiety   . Asthma   . Depression   . Diabetes mellitus without complication   . GERD (gastroesophageal reflux disease)   . Hyperlipidemia   . Migraine   . Gastric ulcer, unspecified as  acute or chronic, without mention of hemorrhage, perforation, or obstruction   . Personal history of colonic polyps   . Other chronic cystitis   . Obesity, unspecified   . Hypertension   . Arthritis     OA Knee; non-specific autoimmune process followed by [facility]he[facility]ology.   Allergies  Allergen Reactions  . Cephalosporins Anaphylaxis  . Penicillins Anaphylaxis   Prior to Admission medications   Medication Sig Start Date End Date Taking? Authorizing Provider  ALPRAZolam Duanne Moron) 0.5 MG tablet TAKE 1 TABLET EVERY DAY AS NEEDED    [name], MD  baclofen (LIORESAL) 10 MG tablet TAKE 1 TABLET BY MOUTH 2 TIMES DAILY. 11/24/12   [name], MD  celecoxib (CELEBREX) 100 MG capsule Take 100 mg by mouth daily.    Historical Provider, MD  cetirizine (ZYRTEC) 10 MG tablet Take 10 mg by mouth daily.    Historical Provider, MD  cholecalciferol (VITAMIN D) 1000 UNITS tablet Take 1,000 Units by mouth daily.    Historical Provider, MD  clarithromycin (BIAXIN) 500 MG tablet Take 1 tablet (500 mg total) by mouth 2 (two) times daily. 04/25/13   [name], MD  ESTRACE VAGINAL 0.1 MG/GM vaginal cream USE AS DIRECTED 05/31/13   [name], MD  ferrous sulfate 325 (65 FE) MG tablet  Take 325 mg by mouth daily with breakfast.    Historical Provider, MD  Fluticasone-Salmeterol (ADVAIR DISKUS) 100-50 MCG/DOSE AEPB Inhale 1 puff into the lungs every 12 (twelve) hours.    Historical Provider, MD  HYDROcodone-acetaminophen (NORCO/VICODIN) 5-325 MG per tablet Take 1 tablet every 8 hours as needed for headache, use sparingly. 05/24/13   Eleanore Kurtis Bushman, PA-C  hydrocortisone (ANUSOL-HC) 25 MG suppository Place 1 suppository (25 mg total) rectally 2 (two) times daily as needed for hemorrhoids or itching. 05/11/13   [name], MD  levofloxacin (LEVAQUIN) 750 MG tablet Take 1 tablet (750 mg total) by mouth daily. 04/04/13   [name], MD  lisinopril (PRINIVIL,ZESTRIL) 5 MG tablet TAKE 1 TABLET  (5 MG TOTAL) BY MOUTH DAILY. 04/30/12   Eleanore Kurtis Bushman, PA-C  montelukast (SINGULAIR) 10 MG tablet TAKE 1 TABLET BY MOUTH DAILY 11/20/12   [name], MD  Multiple Vitamin (MULTIVITAMIN) capsule Take 1 capsule by mouth daily.    Historical Provider, MD  nitrofurantoin, macrocrystal-monohydrate, (MACROBID) 100 MG capsule Take 1 capsule (100 mg total) by mouth 2 (two) times daily as needed. 04/13/13   [name], MD  pantoprazole (PROTONIX) 40 MG tablet Take 1 tablet (40 mg total) by mouth 2 (two) times daily. 09/20/12   [name], MD  phenazopyridine (PYRIDIUM) 100 MG tablet Take 1 tablet (100 mg total) by mouth 3 (three) times daily as needed for pain. 04/13/13   [name], MD  promethazine (PHENERGAN) 25 MG tablet Take 1 tablet (25 mg total) by mouth every 6 (six) hours as needed for nausea or vomiting. 04/04/13   [name], MD  rizatriptan (MAXALT) 10 MG tablet Take 10 mg by mouth as needed. May repeat in 2 hours if needed    Historical Provider, MD  rosuvastatin (CRESTOR) 20 MG tablet Take 1 tablet (20 mg total) by mouth daily. 09/20/12   [name], MD  sulfaSALAzine (AZULFIDINE) 500 MG tablet Take 500 mg by mouth 4 (four) times daily.    Historical Provider, MD  traMADol (ULTRAM) 50 MG tablet Take 1-2 tablets (50-100 mg total) by mouth every 6 (six) hours as needed. 04/04/13   [name], MD  venlafaxine XR (EFFEXOR-XR) 150 MG 24 hr capsule TAKE 1 CAPSULE BY MOUTH DAILY. 05/11/13   [name], MD   Review of Systems  Constitutional: Negative for fever, chills, diaphoresis and fatigue.  HENT: Negative for congestion, postnasal drip, sinus pressure, sneezing and sore throat.   Respiratory: Negative for chest tightness and shortness of breath.   Cardiovascular: Negative for chest pain, palpitations and leg swelling.  Gastrointestinal: Negative for nausea, vomiting, abdominal pain, diarrhea, constipation and blood in stool.  Endocrine: Negative for polydipsia,  polyphagia and polyuria.  Musculoskeletal: Positive for arthralgias.  Skin: Negative for rash.  Neurological: Positive for headaches. Negative for dizziness, tremors, seizures, syncope, facial asymmetry, speech difficulty, weakness, light-headedness and numbness.  Hematological: Bruises/bleeds easily.  Psychiatric/Behavioral: Positive for dysphoric mood. Negative for suicidal ideas, sleep disturbance and self-injury. The patient is nervous/anxious.        Objective:   Physical Exam  Nursing note and vitals reviewed. Constitutional: She is oriented to person, place, and time. She appears well-developed and well-nourished. No distress.  HENT:  Head: Normocephalic and atraumatic.  Right Ear: External ear normal.  Left Ear: External ear normal.  Nose: Nose normal.  Mouth/Throat: Oropharynx is clear and moist.  Eyes: Conjunctivae and EOM are normal. Pupils  are equal, round, and reactive to light.  Neck: Normal range of motion. Neck supple. Carotid bruit is not present. No tracheal deviation present. No thyromegaly present.  Cardiovascular: Normal rate, regular rhythm, normal heart sounds and intact distal pulses.  Exam reveals no gallop and no friction rub.   No murmur heard. Pulmonary/Chest: Effort normal and breath sounds normal. No respiratory distress. She has no wheezes. She has no rales.  Abdominal: Soft. Bowel sounds are normal. She exhibits no distension and no mass. There is no tenderness. There is no rebound and no guarding.  Musculoskeletal: Normal range of motion.  Lymphadenopathy:    She has no cervical adenopathy.  Neurological: She is alert and oriented to person, place, and time. No cranial nerve deficit.  Skin: Skin is warm and dry. No rash noted. She is not diaphoretic. No erythema. No pallor.  Psychiatric: She has a normal mood and affect. Her behavior is normal.      Assessment & Plan:   1. Type II or unspecified type diabetes mellitus without mention of  complication, not stated as uncontrolled   2. Essential hypertension, benign   3. Pure hypercholesterolemia   4. Acute upper respiratory infections of unspecified site   5. Chronic arthralgias of knees and hips   6. Migraines   7. Depression with anxiety    1. DMII: controlled with dietary modification.  Obtain labs. 2.  HTN: controlled; obtain labs; continue current medication. 3.  Hyperlipidemia: controlled with dietary modification; obtain labs. 4.  URI: recurrent issue; allergy/immunology requesting labs. 5.  Chronic Arthralgias: persistent; followed by rheumatology and ortho; continue Ultram tid. 6.  Migraines: persistent; refill of Hydrocodone provided. 7. Depression with anxiety: worsening; increase Effexor XR to 75 mg three tablets daily.  Meds ordered this encounter  Medications  . venlafaxine XR (EFFEXOR-XR) 75 MG 24 hr capsule    Sig: Take 3 capsules (225 mg total) by mouth daily with breakfast.    Dispense:  90 capsule    Refill:  11  . DISCONTD: HYDROcodone-acetaminophen (NORCO/VICODIN) 5-325 MG per tablet    Sig: Take 1 tablet every 8 hours as needed for headache, use sparingly.    Dispense:  40 tablet    Refill:  0    Order Specific Question:  Supervising Provider    Answer:  [name] [[id number]]  . HYDROcodone-acetaminophen (NORCO/VICODIN) 5-325 MG per tablet    Sig: Take 1 tablet every 8 hours as needed for headache, use sparingly.    Dispense:  40 tablet    Refill:  0    Order Specific Question:  Supervising Provider    Answer:  [name] [[id number]]    I personally performed the services described in this documentation, which was scribed in my presence.  The recorded information has been reviewed and is accurate.  [name], M.D.  [facility] [address] [place], [place]  [address] [phone] phone [phone] fax

## 2013-06-11 NOTE — Telephone Encounter (Signed)
Dr Tamala Julian, you just saw pt today. Do you want to RF these? Not sure why they were req'd today when pt came in?

## 2013-06-12 NOTE — Telephone Encounter (Signed)
Please call in Tramadol rx as approved. 

## 2013-06-13 LAB — IGG 1, 2, 3, AND 4
IGG (IMMUNOGLOBIN G), SERUM: 927 mg/dL (ref 690–1700)
IGG SUBCLASS 2: 335 mg/dL (ref 241–700)
IGG SUBCLASS 3: 46 mg/dL (ref 22–178)
IgG Subclass 1: 463 mg/dL (ref 382–929)
IgG Subclass 4: 22.2 mg/dL (ref 4.0–86.0)

## 2013-06-13 NOTE — Telephone Encounter (Signed)
Called in tramadol .  

## 2013-06-14 ENCOUNTER — Encounter: Payer: Self-pay | Admitting: Family Medicine

## 2013-06-27 ENCOUNTER — Ambulatory Visit: Payer: No Typology Code available for payment source | Admitting: Family Medicine

## 2013-07-13 ENCOUNTER — Other Ambulatory Visit: Payer: Self-pay | Admitting: Family Medicine

## 2013-07-17 ENCOUNTER — Encounter: Payer: Self-pay | Admitting: Family Medicine

## 2013-07-17 MED ORDER — LEVOFLOXACIN 750 MG PO TABS: 750.0000 mg | ORAL_TABLET | Freq: Every day | ORAL | Status: DC

## 2013-07-18 ENCOUNTER — Telehealth: Payer: Self-pay

## 2013-07-18 ENCOUNTER — Other Ambulatory Visit: Payer: Self-pay | Admitting: Radiology

## 2013-07-18 MED ORDER — HYDROCODONE-ACETAMINOPHEN 5-325 MG PO TABS: ORAL_TABLET | ORAL | Status: DC

## 2013-07-18 NOTE — Telephone Encounter (Signed)
Pt called to see if Rx was ready for her to pick up. Dr Tamala Julian emailed her and said it was ready. However, we could not locate it. Please advise

## 2013-07-18 NOTE — Telephone Encounter (Signed)
Do you have the Rx?

## 2013-07-19 NOTE — Telephone Encounter (Signed)
Yes; rx ready for pick up.  Patient's daughter presented to office to pickup rx on 07/18/13.

## 2013-07-31 ENCOUNTER — Encounter: Payer: Self-pay | Admitting: Family Medicine

## 2013-08-03 MED ORDER — BLOOD GLUCOSE MONITORING SUPPL KIT: 1.0000 | PACK | Status: DC | PRN

## 2013-08-27 ENCOUNTER — Ambulatory Visit (INDEPENDENT_AMBULATORY_CARE_PROVIDER_SITE_OTHER): Payer: No Typology Code available for payment source | Admitting: Family Medicine

## 2013-08-27 ENCOUNTER — Encounter: Payer: Self-pay | Admitting: Family Medicine

## 2013-08-27 ENCOUNTER — Ambulatory Visit (INDEPENDENT_AMBULATORY_CARE_PROVIDER_SITE_OTHER): Payer: No Typology Code available for payment source

## 2013-08-27 VITALS — BP 120/70 | HR 84 | Temp 98.2°F | Resp 16 | Ht 65.5 in | Wt 235.6 lb

## 2013-08-27 DIAGNOSIS — J302 Other seasonal allergic rhinitis: Secondary | ICD-10-CM

## 2013-08-27 DIAGNOSIS — R0781 Pleurodynia: Secondary | ICD-10-CM

## 2013-08-27 DIAGNOSIS — J3089 Other allergic rhinitis: Secondary | ICD-10-CM

## 2013-08-27 DIAGNOSIS — R079 Chest pain, unspecified: Secondary | ICD-10-CM

## 2013-08-27 DIAGNOSIS — S20211A Contusion of right front wall of thorax, initial encounter: Secondary | ICD-10-CM

## 2013-08-27 DIAGNOSIS — S20219A Contusion of unspecified front wall of thorax, initial encounter: Secondary | ICD-10-CM

## 2013-08-27 DIAGNOSIS — J0100 Acute maxillary sinusitis, unspecified: Secondary | ICD-10-CM

## 2013-08-27 MED ORDER — HYDROCODONE-ACETAMINOPHEN 7.5-325 MG PO TABS: 1.0000 | ORAL_TABLET | Freq: Four times a day (QID) | ORAL | Status: DC | PRN

## 2013-08-27 MED ORDER — LEVOFLOXACIN 750 MG PO TABS: 750.0000 mg | ORAL_TABLET | Freq: Every day | ORAL | Status: DC

## 2013-08-27 NOTE — Patient Instructions (Signed)
Rib Contusion °A rib contusion (bruise) can occur by a blow to the chest or by a fall against a hard object. Usually these will be much better in a couple weeks. If X-rays were taken today and there are no broken bones (fractures), the diagnosis of bruising is made. However, broken ribs may not show up for several days, or may be discovered later on a routine X-ray when signs of healing show up. If this happens to you, it does not mean that something was missed on the X-ray, but simply that it did not show up on the first X-rays. Earlier diagnosis will not usually change the treatment. °HOME CARE INSTRUCTIONS  °· Avoid strenuous activity. Be careful during activities and avoid bumping the injured ribs. Activities that pull on the injured ribs and cause pain should be avoided, if possible. °· For the first day or two, an ice pack used every 20 minutes while awake may be helpful. Put ice in a plastic bag and put a towel between the bag and the skin. °· Eat a normal, well-balanced diet. Drink plenty of fluids to avoid constipation. °· Take deep breaths several times a day to keep lungs free of infection. Try to cough several times a day. Splint the injured area with a pillow while coughing to ease pain. Coughing can help prevent pneumonia. °· Wear a rib belt or binder only if told to do so by your caregiver. If you are wearing a rib belt or binder, you must do the breathing exercises as directed by your caregiver. If not used properly, rib belts or binders restrict breathing which can lead to pneumonia. °· Only take over-the-counter or prescription medicines for pain, discomfort, or fever as directed by your caregiver. °SEEK MEDICAL CARE IF:  °· You or your child has an oral temperature above 102° F (38.9° C). °· Your baby is older than 3 months with a rectal temperature of 100.5° F (38.1° C) or higher for more than 1 day. °· You develop a cough, with thick or bloody sputum. °SEEK IMMEDIATE MEDICAL CARE IF:  °· You  have difficulty breathing. °· You feel sick to your stomach (nausea), have vomiting or belly (abdominal) pain. °· You have worsening pain, not controlled with medications, or there is a change in the location of the pain. °· You develop sweating or radiation of the pain into the arms, jaw or shoulders, or become light headed or faint. °· You or your child has an oral temperature above 102° F (38.9° C), not controlled by medicine. °· Your or your baby is older than 3 months with a rectal temperature of 102° F (38.9° C) or higher. °· Your baby is 3 months old or younger with a rectal temperature of 100.4° F (38° C) or higher. °MAKE SURE YOU:  °· Understand these instructions. °· Will watch your condition. °· Will get help right away if you are not doing well or get worse. °Document Released: 10/06/2000 Document Revised: 05/08/2012 Document Reviewed: 08/30/2007 °[employer]® Patient Information ©2015 [employer]. This information is not intended to replace advice given to you by your health care provider. Make sure you discuss any questions you have with your health care provider. ° °

## 2013-08-27 NOTE — Progress Notes (Addendum)
Patient ID: Bethany Scott, female   DOB: [birth year], 58 y.o.   MRN: [id number]   Subjective:  This chart was scribed for Bethany Forts, MD by Donato Schultz, Medical Scribe. This patient was seen in Room 21 and the patient's care was started at 2:49 PM.   Patient ID: Bethany Scott, female    DOB: 14-May-1955, 58 y.o.   MRN: [id number]  08/27/2013  Fall and Medication Refill  HPI HPI Comments: CHANETTA MOOSMAN is a 58 y.o. female who presents to the [facility] complaining of a fall that happened on Saturday morning after she tripped on ivy.  She landed on her right leg and right breast but did not lose consciousness.  She states that she felt nauseated immediately after she fell and it took her 15 minutes to get up.  She states that it hurts her to breathe and raising her arm up aggravates her breast pain.  She has been wearing a sports bra and taking Celebrex and Tramadol with some relief to her breast pain.  She is not complaining of any right knee pain or gait problems.  She does not endorse neck pain as an associated symptom.    She is also complaining of chest congestion that started Saturday after playing outside with her granddaughter.  She lists postnasal drip as an associated symptom.  She has taken Zyrtec with no relief to her symptoms.  She reports compliance with her allergy nasal sprays.  Review of Systems  Constitutional: Negative for fever, chills, diaphoresis and fatigue.  HENT: Positive for congestion, postnasal drip, rhinorrhea, sinus pressure, sneezing and voice change. Negative for ear pain and sore throat.   Respiratory: Negative for cough, shortness of breath, wheezing and stridor.   Cardiovascular: Positive for chest pain. Negative for palpitations and leg swelling.  Gastrointestinal: Negative for nausea, vomiting, abdominal pain, diarrhea and blood in stool.  Genitourinary: Negative for dysuria, urgency, frequency, hematuria and vaginal bleeding.    Musculoskeletal: Positive for arthralgias, joint swelling and myalgias. Negative for gait problem, neck pain and neck stiffness.  Skin: Negative for color change, pallor, rash and wound.  Neurological: Negative for dizziness, light-headedness and headaches.  Hematological: Bruises/bleeds easily.  Psychiatric/Behavioral: Positive for sleep disturbance.    Past Medical History  Diagnosis Date  . Allergy   . Anxiety   . Asthma   . Depression   . Diabetes mellitus without complication   . GERD (gastroesophageal reflux disease)   . Hyperlipidemia   . Migraine   . Gastric ulcer, unspecified as acute or chronic, without mention of hemorrhage, perforation, or obstruction   . Personal history of colonic polyps   . Other chronic cystitis   . Obesity, unspecified   . Hypertension   . Arthritis     OA Knee; non-specific autoimmune process followed by [facility]heumatology.   Past Surgical History  Procedure Laterality Date  . Cesarean section      x 2  . Tonsillectomy and adenoidectomy    . Sigmoid resection / rectopexy    . Bilateral oophorectomy  2001  . Neck surgery    . Nasal septum surgery    . Spine surgery    . Appendectomy    . Colon surgery    . Rheumatology consult  11/26/2011    multiple arthralgias. Autoimmune labs negative.  Rx for Plaquenil for non-specific autoimmune process.  [name].  . Esophagogastroduodenoscopy  09/08/2012    +HH.  [name].  Symptoms: anemia, hemoccult +.  [name]  Colonoscopy  09/08/2012    diverticulosis, hemorrhoids.  [name].  Symptoms:  anemia, hemoccult +.  . Abdominal hysterectomy  01/25/1994    uterine fibroids; DUB; cervical dysplasia; ovaries resected two years later.     Allergies  Allergen Reactions  . Cephalosporins Anaphylaxis  . Penicillins Anaphylaxis   Current Outpatient Prescriptions  Medication Sig Dispense Refill  . ALPRAZolam (XANAX) 0.5 MG tablet TAKE 1 TABLET EVERY DAY AS NEEDED  30 tablet  5  . azelastine  (ASTELIN) 0.1 % nasal spray       . Blood Glucose Monitoring Suppl KIT 1 each by Does not apply route as needed. Please dispense the monitor and supplies compliant with pt insurance ONETOUCH  100 each  11  . celecoxib (CELEBREX) 100 MG capsule Take 100 mg by mouth daily.      . cetirizine (ZYRTEC) 10 MG tablet Take 10 mg by mouth daily.      . cholecalciferol (VITAMIN D) 1000 UNITS tablet Take 1,000 Units by mouth daily.      [name] ESTRACE VAGINAL 0.1 MG/GM vaginal cream USE AS DIRECTED  42.5 g  3  . ferrous sulfate 325 (65 FE) MG tablet Take 325 mg by mouth daily with breakfast.      . fluticasone (FLONASE) 50 MCG/ACT nasal spray       . Fluticasone-Salmeterol (ADVAIR DISKUS) 100-50 MCG/DOSE AEPB Inhale 1 puff into the lungs every 12 (twelve) hours.      . hydrocortisone (ANUSOL-HC) 25 MG suppository PLACE 1 SUPPOSITORY (25 MG TOTAL) RECTALLY 2 (TWO) TIMES DAILY AS NEEDED FOR HEMORRHOIDS OR ITCHING.  12 suppository  2  . lisinopril (PRINIVIL,ZESTRIL) 5 MG tablet TAKE 1 TABLET (5 MG TOTAL) BY MOUTH DAILY.  30 tablet  5  . lisinopril (PRINIVIL,ZESTRIL) 5 MG tablet TAKE 1 TABLET BY MOUTH EVERY DAY  30 tablet  3  . montelukast (SINGULAIR) 10 MG tablet TAKE 1 TABLET BY MOUTH DAILY  30 tablet  8  . Multiple Vitamin (MULTIVITAMIN) capsule Take 1 capsule by mouth daily.      . nitrofurantoin, macrocrystal-monohydrate, (MACROBID) 100 MG capsule TAKE 1 CAPSULE (100 MG TOTAL) BY MOUTH 2 (TWO) TIMES DAILY AS NEEDED.  30 capsule  2  . pantoprazole (PROTONIX) 40 MG tablet Take 1 tablet (40 mg total) by mouth 2 (two) times daily.  60 tablet  11  . phenazopyridine (PYRIDIUM) 100 MG tablet Take 1 tablet (100 mg total) by mouth 3 (three) times daily as needed for pain.  30 tablet  2  . promethazine (PHENERGAN) 25 MG tablet Take 1 tablet (25 mg total) by mouth every 6 (six) hours as needed for nausea or vomiting.  40 tablet  3  . rizatriptan (MAXALT) 10 MG tablet Take 10 mg by mouth as needed. May repeat in 2 hours if  needed      . sulfaSALAzine (AZULFIDINE) 500 MG tablet Take 500 mg by mouth 4 (four) times daily.      . traMADol (ULTRAM) 50 MG tablet TAKE 1 TO 2 TABLETS BY MOUTH EVERY 6 HOURS AS NEEDED  150 tablet  3  . venlafaxine XR (EFFEXOR-XR) 75 MG 24 hr capsule Take 3 capsules (225 mg total) by mouth daily with breakfast.  90 capsule  11  . HYDROcodone-acetaminophen (NORCO) 7.5-325 MG per tablet Take 1 tablet by mouth every 6 (six) hours as needed for moderate pain.  40 tablet  0  . HYPERCARE 20 % external solution       . levofloxacin (  LEVAQUIN) 750 MG tablet Take 1 tablet (750 mg total) by mouth daily.  10 tablet  0  . meloxicam (MOBIC) 15 MG tablet        No current facility-administered medications for this visit.    Objective:  BP 120/70  Pulse 84  Temp(Src) 98.2 F (36.8 C) (Oral)  Resp 16  Ht 5' 5.5" (1.664 m)  Wt 235 lb 9.6 oz (106.867 kg)  BMI 38.60 kg/m2  SpO2 99%  Physical Exam  Nursing note and vitals reviewed. Constitutional: She is oriented to person, place, and time. She appears well-developed and well-nourished. No distress.  HENT:  Head: Normocephalic and atraumatic.  Right Ear: External ear normal.  Left Ear: External ear normal.  Nose: Nose normal.  Mouth/Throat: Oropharynx is clear and moist. No oropharyngeal exudate.  +drainage in OP.  Eyes: Conjunctivae and EOM are normal. Pupils are equal, round, and reactive to light.  Neck: Normal range of motion and full passive range of motion without pain. Neck supple. No spinous process tenderness and no muscular tenderness present. Carotid bruit is not present. Normal range of motion present. No thyromegaly present.  Cardiovascular: Normal rate, regular rhythm, normal heart sounds and intact distal pulses.  Exam reveals no gallop and no friction rub.   No murmur heard. Pulmonary/Chest: Effort normal and breath sounds normal. No respiratory distress. She has no wheezes. She has no rales. She exhibits tenderness and bony  tenderness. She exhibits no laceration, no crepitus, no edema, no deformity, no swelling and no retraction. Right breast exhibits tenderness. Right breast exhibits no inverted nipple, no mass, no nipple discharge and no skin change. Breasts are symmetrical.  Tenderness to the anterior chest wall, superior on the right.  No sternal tenderness.  Diffuse tenderness of the right breast.  The breast is without masses, induration, or ecchymosis.   Abdominal: Soft. Bowel sounds are normal. She exhibits no distension and no mass. There is no tenderness. There is no rebound and no guarding.  Musculoskeletal: Normal range of motion.       Right shoulder: Normal.       Left shoulder: Normal.       Right knee: She exhibits normal range of motion, no swelling, no effusion, no ecchymosis, no deformity and no erythema. Tenderness found. Lateral joint line tenderness noted. No medial joint line, no MCL, no LCL and no patellar tendon tenderness noted.       Cervical back: Normal. She exhibits normal range of motion, no tenderness, no bony tenderness and no pain.  No clavicular tenderness.  No cervical spine tenderness or trapezius tenderness. Tender to palpation of lateral aspect of the right knee.  McMurray's is negative.  Full extension and flexion.  Tender to palpation of lower ribs.  Lymphadenopathy:    She has no cervical adenopathy.  Neurological: She is alert and oriented to person, place, and time. No cranial nerve deficit.  Skin: Skin is warm and dry. No rash noted. She is not diaphoretic. No erythema. No pallor.  Scattered ecchymoses B forearms.  Psychiatric: She has a normal mood and affect. Her behavior is normal.     [facility] preliminary x-ray report read by Dr. Tamala Julian: R RIB FILMS: NAD.  Assessment & Plan:   1. Rib pain on right side   2. Contusion of rib, right, initial encounter   3. Other seasonal allergic rhinitis   4. Acute maxillary sinusitis, recurrence not specified    1. R rib  pain/contusion:  New.  Continue Celebrex  and Tramadol for milder pain; rx for Hydrocodone 7.55m qid PRN moderate to severe pain.  Recommend taking deep breaths frequently throughout the day. Supportive care with rest; avoid lifting more than five pounds for the next two weeks. 2.  Allergic Rhinitis: uncontrolled; continue allergy regimen. 3. Acute maxillary sinusitis: :New.  Rx for Levaquin 7555mdaily provided.  Meds ordered this encounter  Medications  . HYDROcodone-acetaminophen (NORCO) 7.5-325 MG per tablet    Sig: Take 1 tablet by mouth every 6 (six) hours as needed for moderate pain.    Dispense:  40 tablet    Refill:  0  . levofloxacin (LEVAQUIN) 750 MG tablet    Sig: Take 1 tablet (750 mg total) by mouth daily.    Dispense:  10 tablet    Refill:  0    No Follow-up on file.  I personally performed the services described in this documentation, which was scribed in my presence.  The recorded information has been reviewed and is accurate.  KrReginia FortsM.D.  [facility]  [phone] ([phone]

## 2013-08-31 ENCOUNTER — Other Ambulatory Visit: Payer: Self-pay | Admitting: Family Medicine

## 2013-09-01 NOTE — Telephone Encounter (Signed)
Pt had some at her OV and is now out. Can we RF?

## 2013-09-04 ENCOUNTER — Encounter: Payer: Self-pay | Admitting: Family Medicine

## 2013-09-04 MED ORDER — HYDROCODONE-ACETAMINOPHEN 7.5-325 MG PO TABS: 1.0000 | ORAL_TABLET | Freq: Four times a day (QID) | ORAL | Status: DC | PRN

## 2013-09-05 NOTE — Telephone Encounter (Signed)
Dr Tamala Julian, pharmacist called to check on this RF req. I advised him that you had just written Vicodin Rx for pt but that I would check to see if you wanted to RF the tramadol also. I see in your OV notes from 08/27/13 that you wanted pt to take tramadol for milder pain. Do you want to RF?

## 2013-09-11 NOTE — Telephone Encounter (Signed)
Please call in REFILL for Tramadol for patient as approved. I will not be in the office today; thus, please CALL in refill.

## 2013-09-11 NOTE — Telephone Encounter (Signed)
Called in Rx

## 2013-09-17 ENCOUNTER — Encounter: Payer: Self-pay | Admitting: Family Medicine

## 2013-09-17 ENCOUNTER — Ambulatory Visit (INDEPENDENT_AMBULATORY_CARE_PROVIDER_SITE_OTHER): Payer: No Typology Code available for payment source | Admitting: Family Medicine

## 2013-09-17 VITALS — BP 132/74 | HR 89 | Temp 98.0°F | Resp 16 | Ht 66.0 in | Wt 232.6 lb

## 2013-09-17 DIAGNOSIS — D649 Anemia, unspecified: Secondary | ICD-10-CM

## 2013-09-17 DIAGNOSIS — F418 Other specified anxiety disorders: Secondary | ICD-10-CM

## 2013-09-17 DIAGNOSIS — G43009 Migraine without aura, not intractable, without status migrainosus: Secondary | ICD-10-CM

## 2013-09-17 DIAGNOSIS — F341 Dysthymic disorder: Secondary | ICD-10-CM

## 2013-09-17 DIAGNOSIS — M25569 Pain in unspecified knee: Secondary | ICD-10-CM

## 2013-09-17 DIAGNOSIS — M2550 Pain in unspecified joint: Secondary | ICD-10-CM

## 2013-09-17 DIAGNOSIS — E7800 Pure hypercholesterolemia, unspecified: Secondary | ICD-10-CM

## 2013-09-17 DIAGNOSIS — M25559 Pain in unspecified hip: Secondary | ICD-10-CM

## 2013-09-17 DIAGNOSIS — E119 Type 2 diabetes mellitus without complications: Secondary | ICD-10-CM

## 2013-09-17 DIAGNOSIS — I10 Essential (primary) hypertension: Secondary | ICD-10-CM

## 2013-09-17 DIAGNOSIS — Z23 Encounter for immunization: Secondary | ICD-10-CM

## 2013-09-17 DIAGNOSIS — R7309 Other abnormal glucose: Secondary | ICD-10-CM

## 2013-09-17 LAB — COMPLETE METABOLIC PANEL WITH GFR
ALT: 14 U/L (ref 0–35)
AST: 13 U/L (ref 0–37)
Albumin: 4.3 g/dL (ref 3.5–5.2)
Alkaline Phosphatase: 84 U/L (ref 39–117)
BILIRUBIN TOTAL: 0.6 mg/dL (ref 0.2–1.2)
BUN: 13 mg/dL (ref 6–23)
CO2: 28 meq/L (ref 19–32)
Calcium: 9.3 mg/dL (ref 8.4–10.5)
Chloride: 101 mEq/L (ref 96–112)
Creat: 0.59 mg/dL (ref 0.50–1.10)
GLUCOSE: 139 mg/dL — AB (ref 70–99)
Potassium: 4.1 mEq/L (ref 3.5–5.3)
Sodium: 139 mEq/L (ref 135–145)
Total Protein: 6.7 g/dL (ref 6.0–8.3)

## 2013-09-17 LAB — CBC WITH DIFFERENTIAL/PLATELET
Basophils Absolute: 0.1 10*3/uL (ref 0.0–0.1)
Basophils Relative: 1 % (ref 0–1)
EOS ABS: 0.2 10*3/uL (ref 0.0–0.7)
Eosinophils Relative: 3 % (ref 0–5)
HCT: 41.7 % (ref 36.0–46.0)
Hemoglobin: 13.9 g/dL (ref 12.0–15.0)
Lymphocytes Relative: 27 % (ref 12–46)
Lymphs Abs: 1.4 10*3/uL (ref 0.7–4.0)
MCH: 29.4 pg (ref 26.0–34.0)
MCHC: 33.3 g/dL (ref 30.0–36.0)
MCV: 88.2 fL (ref 78.0–100.0)
MONOS PCT: 8 % (ref 3–12)
Monocytes Absolute: 0.4 10*3/uL (ref 0.1–1.0)
Neutro Abs: 3.2 10*3/uL (ref 1.7–7.7)
Neutrophils Relative %: 61 % (ref 43–77)
PLATELETS: 265 10*3/uL (ref 150–400)
RBC: 4.73 MIL/uL (ref 3.87–5.11)
RDW: 13.1 % (ref 11.5–15.5)
WBC: 5.3 10*3/uL (ref 4.0–10.5)

## 2013-09-17 LAB — IBC PANEL
%SAT: 41 % (ref 20–55)
TIBC: 297 ug/dL (ref 250–470)
UIBC: 176 ug/dL (ref 125–400)

## 2013-09-17 LAB — IRON: Iron: 121 ug/dL (ref 42–145)

## 2013-09-17 LAB — HEMOGLOBIN A1C
HEMOGLOBIN A1C: 6.3 % — AB (ref <5.7)
MEAN PLASMA GLUCOSE: 134 mg/dL — AB (ref <117)

## 2013-09-17 MED ORDER — HYDROCODONE-ACETAMINOPHEN 7.5-325 MG PO TABS: 1.0000 | ORAL_TABLET | Freq: Four times a day (QID) | ORAL | Status: DC | PRN

## 2013-09-17 NOTE — Progress Notes (Signed)
Subjective:    Patient ID: Bethany Scott, female    DOB: 1955-12-05, 58 y.o.   MRN: [id number]  09/17/2013  Follow-up   HPI This 58 y.o. female presents for three month follow-up of the following:  1.  R rib contusion:  Much better.  Slept in own bed last night for the first time.  2. DMII: sugar has dropped at work; starts sweating.  Tested once with acute symptoms, sugar was 60.  Checking qod; running 80s.  Does not eat after 6:00pm.  Gets lows first thing in the morning.    3.  HTN: does not check BP at home. Patient reports good compliance with medication, good tolerance to medication, and good symptom control.    4. Hyperlipidemia: not taking any medication at this time; cholesterol higher at last visit.  5.  Anxiety with depression:  Increased Effexor to three tablets daily.  Lost Tyler Deis; works longer in evenings.  Not as busy with Tyler Deis is gone.  Keeping summer schedule.    6.  Migraines:  Migraines are better.  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  Requesting refill of Hydrocodone.  7.  Arthralgias:  No changes.   Seeing [name] July 2015; s/p L hip injection.  Followed by [facility] and PA.  Known knee OA.  Known OA B hips.  Elliptical and bike recommended for exercise; swimming also recommended by ortho.  Tramadol 2 tablets every six hours.  Usually takes 6-8 every day.  Tramadol keeps pt going.    8. Bruising:  No change; continues to bruise easily.  9.  Allergic Rhinitis:  Allergies are really bad this year per Dr. Orvil Feil.     Review of Systems  Constitutional: Negative for fever, chills, diaphoresis and fatigue.  Eyes: Negative for visual disturbance.  Respiratory: Negative for cough and shortness of breath.   Cardiovascular: Negative for chest pain, palpitations and leg swelling.  Gastrointestinal: Negative for nausea, vomiting, abdominal pain, diarrhea and constipation.  Endocrine: Negative for cold intolerance, heat intolerance, polydipsia, polyphagia and  polyuria.  Genitourinary: Negative for dysuria and frequency.  Musculoskeletal: Positive for arthralgias, joint swelling and myalgias.  Skin: Negative for color change, pallor, rash and wound.  Neurological: Negative for dizziness, tremors, seizures, syncope, facial asymmetry, speech difficulty, weakness, light-headedness, numbness and headaches.  Hematological: Bruises/bleeds easily.  Psychiatric/Behavioral: Negative for suicidal ideas, sleep disturbance, self-injury and dysphoric mood. The patient is not nervous/anxious.     Past Medical History  Diagnosis Date  . Allergy   . Anxiety   . Asthma   . Depression   . Diabetes mellitus without complication   . GERD (gastroesophageal reflux disease)   . Hyperlipidemia   . Migraine   . Gastric ulcer, unspecified as acute or chronic, without mention of hemorrhage, perforation, or obstruction   . Personal history of colonic polyps   . Other chronic cystitis   . Obesity, unspecified   . Hypertension   . Arthritis     OA Knee; non-specific autoimmune process followed by [facility]ology.   Past Surgical History  Procedure Laterality Date  . Cesarean section      x 2  . Tonsillectomy and adenoidectomy    . Sigmoid resection / rectopexy    . Bilateral oophorectomy  2001  . Neck surgery    . Nasal septum surgery    . Spine surgery    . Appendectomy    . Colon surgery    . Rheumatology consult  11/26/2011    multiple arthralgias.  Autoimmune labs negative.  Rx for Plaquenil for non-specific autoimmune process.  [name].  . Esophagogastroduodenoscopy  09/08/2012    +[facility].  [name].  Symptoms: anemia, hemoccult +.  . Colonoscopy  09/08/2012    diverticulosis, hemorrhoids.  [name].  Symptoms:  anemia, hemoccult +.  . Abdominal hysterectomy  01/25/1994    uterine fibroids; DUB; cervical dysplasia; ovaries resected two years later.     Allergies  Allergen Reactions  . Cephalosporins Anaphylaxis  . Penicillins Anaphylaxis     Current Outpatient Prescriptions  Medication Sig Dispense Refill  . ALPRAZolam (XANAX) 0.5 MG tablet TAKE 1 TABLET EVERY DAY AS NEEDED  30 tablet  5  . azelastine (ASTELIN) 0.1 % nasal spray       . Blood Glucose Monitoring Suppl KIT 1 each by Does not apply route as needed. Please dispense the monitor and supplies compliant with pt insurance ONETOUCH  100 each  11  . celecoxib (CELEBREX) 100 MG capsule Take 100 mg by mouth daily.      . cetirizine (ZYRTEC) 10 MG tablet Take 10 mg by mouth daily.      . cholecalciferol (VITAMIN D) 1000 UNITS tablet Take 1,000 Units by mouth daily.      [name] ESTRACE VAGINAL 0.1 MG/GM vaginal cream USE AS DIRECTED  42.5 g  3  . ferrous sulfate 325 (65 FE) MG tablet Take 325 mg by mouth daily with breakfast.      . fluticasone (FLONASE) 50 MCG/ACT nasal spray       . Fluticasone-Salmeterol (ADVAIR DISKUS) 100-50 MCG/DOSE AEPB Inhale 1 puff into the lungs every 12 (twelve) hours.      [name] HYDROcodone-acetaminophen (NORCO) 7.5-325 MG per tablet Take 1 tablet by mouth every 6 (six) hours as needed for moderate pain.  40 tablet  0  . hydrocortisone (ANUSOL-HC) 25 MG suppository PLACE 1 SUPPOSITORY (25 MG TOTAL) RECTALLY 2 (TWO) TIMES DAILY AS NEEDED FOR HEMORRHOIDS OR ITCHING.  12 suppository  2  . HYPERCARE 20 % external solution       . levofloxacin (LEVAQUIN) 750 MG tablet Take 1 tablet (750 mg total) by mouth daily.  10 tablet  0  . lisinopril (PRINIVIL,ZESTRIL) 5 MG tablet TAKE 1 TABLET (5 MG TOTAL) BY MOUTH DAILY.  30 tablet  5  . lisinopril (PRINIVIL,ZESTRIL) 5 MG tablet TAKE 1 TABLET BY MOUTH EVERY DAY  30 tablet  3  . meloxicam (MOBIC) 15 MG tablet       . montelukast (SINGULAIR) 10 MG tablet TAKE 1 TABLET BY MOUTH DAILY  30 tablet  8  . Multiple Vitamin (MULTIVITAMIN) capsule Take 1 capsule by mouth daily.      . nitrofurantoin, macrocrystal-monohydrate, (MACROBID) 100 MG capsule TAKE 1 CAPSULE (100 MG TOTAL) BY MOUTH 2 (TWO) TIMES DAILY AS NEEDED.  30 capsule   2  . pantoprazole (PROTONIX) 40 MG tablet Take 1 tablet (40 mg total) by mouth 2 (two) times daily.  60 tablet  11  . phenazopyridine (PYRIDIUM) 100 MG tablet Take 1 tablet (100 mg total) by mouth 3 (three) times daily as needed for pain.  30 tablet  2  . promethazine (PHENERGAN) 25 MG tablet Take 1 tablet (25 mg total) by mouth every 6 (six) hours as needed for nausea or vomiting.  40 tablet  3  . rizatriptan (MAXALT) 10 MG tablet Take 10 mg by mouth as needed. May repeat in 2 hours if needed      . sulfaSALAzine (AZULFIDINE) 500 MG tablet  Take 500 mg by mouth 4 (four) times daily.      . traMADol (ULTRAM) 50 MG tablet TAKE 1 TO 2 TABLETS BY MOUTH EVERY 6 HOURS AS NEEDED  150 tablet  2  . venlafaxine XR (EFFEXOR-XR) 75 MG 24 hr capsule Take 3 capsules (225 mg total) by mouth daily with breakfast.  90 capsule  11   No current facility-administered medications for this visit.       Objective:    BP 132/74  Pulse 89  Temp(Src) 98 F (36.7 C) (Oral)  Resp 16  Ht 5' 6"  (1.676 m)  Wt 232 lb 9.6 oz (105.507 kg)  BMI 37.56 kg/m2  SpO2 98% Physical Exam  Constitutional: She is oriented to person, place, and time. She appears well-developed and well-nourished. No distress.  HENT:  Head: Normocephalic and atraumatic.  Right Ear: External ear normal.  Left Ear: External ear normal.  Nose: Nose normal.  Mouth/Throat: Oropharynx is clear and moist.  Eyes: Conjunctivae and EOM are normal. Pupils are equal, round, and reactive to light.  Neck: Normal range of motion. Neck supple. Carotid bruit is not present. No thyromegaly present.  Cardiovascular: Normal rate, regular rhythm, normal heart sounds and intact distal pulses.  Exam reveals no gallop and no friction rub.   No murmur heard. Pulmonary/Chest: Effort normal and breath sounds normal. She has no wheezes. She has no rales.  Abdominal: Soft. Bowel sounds are normal. She exhibits no distension and no mass. There is no tenderness. There is  no rebound and no guarding.  Musculoskeletal:       Right shoulder: Normal.       Left shoulder: Normal.       Cervical back: Normal.  Lymphadenopathy:    She has no cervical adenopathy.  Neurological: She is alert and oriented to person, place, and time. No cranial nerve deficit.  Skin: Skin is warm and dry. No rash noted. She is not diaphoretic. No erythema. No pallor.  Psychiatric: She has a normal mood and affect. Her behavior is normal.   Results for orders placed in visit on 06/11/13  CBC WITH DIFFERENTIAL      Result Value Ref Range   WBC 5.7  4.0 - 10.5 K/uL   RBC 4.67  3.87 - 5.11 MIL/uL   Hemoglobin 14.2  12.0 - 15.0 g/dL   HCT 41.4  36.0 - 46.0 %   MCV 88.7  78.0 - 100.0 fL   MCH 30.4  26.0 - 34.0 pg   MCHC 34.3  30.0 - 36.0 g/dL   RDW 12.5  11.5 - 15.5 %   Platelets 289  150 - 400 K/uL   Neutrophils Relative % 59  43 - 77 %   Neutro Abs 3.4  1.7 - 7.7 K/uL   Lymphocytes Relative 30  12 - 46 %   Lymphs Abs 1.7  0.7 - 4.0 K/uL   Monocytes Relative 8  3 - 12 %   Monocytes Absolute 0.5  0.1 - 1.0 K/uL   Eosinophils Relative 2  0 - 5 %   Eosinophils Absolute 0.1  0.0 - 0.7 K/uL   Basophils Relative 1  0 - 1 %   Basophils Absolute 0.1  0.0 - 0.1 K/uL   Smear Review Criteria for review not met    COMPLETE METABOLIC PANEL WITH GFR      Result Value Ref Range   Sodium 138  135 - 145 mEq/L   Potassium 4.1  3.5 - 5.3  mEq/L   Chloride 101  96 - 112 mEq/L   CO2 27  19 - 32 mEq/L   Glucose, Bld 135 (*) 70 - 99 mg/dL   BUN 12  6 - 23 mg/dL   Creat 0.64  0.50 - 1.10 mg/dL   Total Bilirubin 0.3  0.2 - 1.2 mg/dL   Alkaline Phosphatase 62  39 - 117 U/L   AST 12  0 - 37 U/L   ALT 12  0 - 35 U/L   Total Protein 7.2  6.0 - 8.3 g/dL   Albumin 4.4  3.5 - 5.2 g/dL   Calcium 9.7  8.4 - 10.5 mg/dL   GFR, Est African American >89     GFR, Est Non African American >89    HEMOGLOBIN A1C      Result Value Ref Range   Hemoglobin A1C 6.0 (*) <5.7 %   Mean Plasma Glucose 126 (*) <117  mg/dL  LIPID PANEL      Result Value Ref Range   Cholesterol 215 (*) 0 - 200 mg/dL   Triglycerides 255 (*) <150 mg/dL   HDL 43  >39 mg/dL   Total CHOL/HDL Ratio 5.0     VLDL 51 (*) 0 - 40 mg/dL   LDL Cholesterol 121 (*) 0 - 99 mg/dL  IGG 1, 2, 3, AND 4      Result Value Ref Range   IgG (Immunoglobin G), Serum 927  690 - 1700 mg/dL   IgG Subclass 1 463  382 - 929 mg/dL   IgG Subclass 2 335  241 - 700 mg/dL   IgG Subclass 3 46  22 - 178 mg/dL   IgG Subclass 4 22.2  4.0 - 86.0 mg/dL   HEPATITIS B#3 ADMINISTERED IN OFFICE.    Assessment & Plan:   1. Essential hypertension, benign   2. Other abnormal glucose   3. Type II or unspecified type diabetes mellitus without mention of complication, not stated as uncontrolled   4. Anemia, unspecified anemia type   5. Arthralgia   6. Pure hypercholesterolemia   7. Migraine without aura and without status migrainosus, not intractable   8. Depression with anxiety   9. Chronic arthralgias of knees and hips, unspecified laterality    1. HTN: controlled; obtain labs; no changes to management. 2.  DMII: controlled; obtain labs; s/p Hepatitis B#3 per current guidelines; recommend larger protein intake in mornings to avoid hypoglycemia.  Goal morning protein source of 10-15 grams. 3.  Hypercholesterolemia: uncontrolled at last visit.  Recommend weight loss, exercise, low-cholesterol food choices. 4.  Anemia: improved; repeat iron levels today per pt request. 5.  Arthralgias:  Persistent; continue Tramadol two every 6-8 hours. 6.  Migraines; moderately controlled; refill of Hydrocodone provided. 7.  Depression with anxiety: improved with increased dose of Effexor. 8.  OA knees and hips: stable; followed by rheumatology and ortho. 9. R rib contusion improved; normal ROM.  Meds ordered this encounter  Medications  . HYDROcodone-acetaminophen (NORCO) 7.5-325 MG per tablet    Sig: Take 1 tablet by mouth every 6 (six) hours as needed for moderate  pain.    Dispense:  40 tablet    Refill:  0    Return in about 3 months (around 12/18/2013) for complete physical examiniation.    [name], M.D.  [facility] [address] [place], [place]  [address] [phone] phone [phone] fax

## 2013-09-18 LAB — VITAMIN D 25 HYDROXY (VIT D DEFICIENCY, FRACTURES): Vit D, 25-Hydroxy: 38 ng/mL (ref 30–89)

## 2013-10-09 ENCOUNTER — Encounter: Payer: Self-pay | Admitting: Family Medicine

## 2013-10-11 MED ORDER — LEVOFLOXACIN 750 MG PO TABS: 750.0000 mg | ORAL_TABLET | Freq: Every day | ORAL | Status: DC

## 2013-10-21 ENCOUNTER — Other Ambulatory Visit: Payer: Self-pay | Admitting: Family Medicine

## 2013-10-22 NOTE — Telephone Encounter (Signed)
Please call in refill for Alprazolam as approved.

## 2013-10-23 NOTE — Telephone Encounter (Signed)
Called in.

## 2013-10-25 ENCOUNTER — Encounter: Payer: Self-pay | Admitting: Family Medicine

## 2013-10-26 MED ORDER — HYDROCODONE-ACETAMINOPHEN 7.5-325 MG PO TABS: 1.0000 | ORAL_TABLET | Freq: Four times a day (QID) | ORAL | Status: DC | PRN

## 2013-10-27 ENCOUNTER — Telehealth: Payer: Self-pay

## 2013-10-27 NOTE — Telephone Encounter (Signed)
Patient called stated she received a message from Dr. Tamala Julian through [facility] stating her prescription is ready for pick up. I did not see a prescription in the pick up drawer for the patient. I also checked 104 pick up drawer. Hydrocodone-Acetaminophen 7.5-325 MG. Please call patient at [phone].

## 2013-10-28 NOTE — Telephone Encounter (Signed)
Pt's prescription placed at [facility] desk.

## 2013-10-29 NOTE — Telephone Encounter (Signed)
Pt notified. Script in pick up drawer.

## 2013-11-06 ENCOUNTER — Other Ambulatory Visit: Payer: Self-pay | Admitting: Family Medicine

## 2013-11-06 NOTE — Telephone Encounter (Signed)
Please call in or fax rx as approved for Tramadol.

## 2013-11-07 ENCOUNTER — Other Ambulatory Visit: Payer: Self-pay | Admitting: Family Medicine

## 2013-11-07 NOTE — Telephone Encounter (Signed)
Prescription called in

## 2013-11-15 ENCOUNTER — Other Ambulatory Visit: Payer: Self-pay | Admitting: Family Medicine

## 2013-11-16 NOTE — Telephone Encounter (Signed)
Called in.

## 2013-11-16 NOTE — Telephone Encounter (Signed)
I approved Tramadol with two refills on 11/06/13; has this been called in for the patient yet?  If not, please call into pharmacy.

## 2013-11-24 ENCOUNTER — Other Ambulatory Visit: Payer: Self-pay | Admitting: Physician Assistant

## 2013-11-29 ENCOUNTER — Encounter: Payer: Self-pay | Admitting: Family Medicine

## 2013-12-01 ENCOUNTER — Other Ambulatory Visit: Payer: Self-pay

## 2013-12-01 NOTE — Telephone Encounter (Signed)
PT wanted to contact Dr. Tamala Julian to let her know that she is experiencing a lot of pain in her hips, knees, and left ankle.

## 2013-12-02 ENCOUNTER — Encounter: Payer: Self-pay | Admitting: Family Medicine

## 2013-12-03 ENCOUNTER — Encounter: Payer: Self-pay | Admitting: Family Medicine

## 2013-12-04 NOTE — Telephone Encounter (Signed)
Has pt contacted Dr. [name] of rheumatology or her orthopedist?

## 2013-12-05 ENCOUNTER — Encounter: Payer: Self-pay | Admitting: Family Medicine

## 2013-12-05 ENCOUNTER — Other Ambulatory Visit: Payer: Self-pay | Admitting: Family Medicine

## 2013-12-05 MED ORDER — HYDROCODONE-ACETAMINOPHEN 7.5-325 MG PO TABS: 1.0000 | ORAL_TABLET | Freq: Four times a day (QID) | ORAL | Status: DC | PRN

## 2013-12-05 NOTE — Telephone Encounter (Signed)
Called to confirm pt has received email response from Dr. Tamala Julian. Spoke to pt- She has an appt with [facility]- An osteoarthritis specialist. She needs a refill on her Vicodin and have her son pick up script.

## 2013-12-05 NOTE — Telephone Encounter (Signed)
Rx in p/u drawer

## 2013-12-05 NOTE — Telephone Encounter (Signed)
LMOM letting pt know

## 2013-12-05 NOTE — Telephone Encounter (Signed)
Hydrocodone approved and ready for pick up.  Please call and advise pt.

## 2013-12-06 NOTE — Telephone Encounter (Signed)
See Refill message. LMOM letting pt know this was ready yesterday

## 2013-12-12 ENCOUNTER — Telehealth: Payer: Self-pay

## 2013-12-12 NOTE — Telephone Encounter (Signed)
Called patient and spoke with her about records request we received via fax. Release was processed but was waiting to be sent out so that we could obtain the address we were to send the records to. [facility] today and they advised that she has not been seen there. Called the patient to confirm this and asked where she wanted [place] to send the records. Patient advised that she already had her appt and to not send records. Will send records to be shredded now.

## 2013-12-26 ENCOUNTER — Ambulatory Visit (INDEPENDENT_AMBULATORY_CARE_PROVIDER_SITE_OTHER): Payer: No Typology Code available for payment source | Admitting: Family Medicine

## 2013-12-26 ENCOUNTER — Encounter: Payer: Self-pay | Admitting: Family Medicine

## 2013-12-26 VITALS — BP 116/80 | HR 93 | Temp 97.9°F | Resp 16 | Ht 64.5 in | Wt 232.2 lb

## 2013-12-26 DIAGNOSIS — E119 Type 2 diabetes mellitus without complications: Secondary | ICD-10-CM

## 2013-12-26 DIAGNOSIS — G894 Chronic pain syndrome: Secondary | ICD-10-CM

## 2013-12-26 DIAGNOSIS — E7800 Pure hypercholesterolemia, unspecified: Secondary | ICD-10-CM

## 2013-12-26 DIAGNOSIS — J0100 Acute maxillary sinusitis, unspecified: Secondary | ICD-10-CM

## 2013-12-26 DIAGNOSIS — Z01419 Encounter for gynecological examination (general) (routine) without abnormal findings: Secondary | ICD-10-CM

## 2013-12-26 DIAGNOSIS — Z0000 Encounter for general adult medical examination without abnormal findings: Secondary | ICD-10-CM

## 2013-12-26 DIAGNOSIS — I10 Essential (primary) hypertension: Secondary | ICD-10-CM

## 2013-12-26 LAB — LIPID PANEL
CHOLESTEROL: 208 mg/dL — AB (ref 0–200)
HDL: 47 mg/dL (ref >39)
LDL Cholesterol: 128 mg/dL — ABNORMAL HIGH (ref 0–99)
TRIGLYCERIDES: 167 mg/dL — AB (ref <150)
Total CHOL/HDL Ratio: 4.4 Ratio
VLDL: 33 mg/dL (ref 0–40)

## 2013-12-26 LAB — COMPLETE METABOLIC PANEL WITH GFR
ALT: 12 U/L (ref 0–35)
AST: 15 U/L (ref 0–37)
Albumin: 4.2 g/dL (ref 3.5–5.2)
Alkaline Phosphatase: 61 U/L (ref 39–117)
BUN: 12 mg/dL (ref 6–23)
CALCIUM: 9.6 mg/dL (ref 8.4–10.5)
CHLORIDE: 101 meq/L (ref 96–112)
CO2: 29 mEq/L (ref 19–32)
Creat: 0.76 mg/dL (ref 0.50–1.10)
GFR, Est African American: >89 mL/min
GFR, Est Non African American: 87 mL/min
Glucose, Bld: 92 mg/dL (ref 70–99)
Potassium: 3.9 mEq/L (ref 3.5–5.3)
Sodium: 139 mEq/L (ref 135–145)
Total Bilirubin: 0.4 mg/dL (ref 0.2–1.2)
Total Protein: 6.8 g/dL (ref 6.0–8.3)

## 2013-12-26 LAB — POCT URINALYSIS DIPSTICK
BILIRUBIN UA: NEGATIVE
Glucose, UA: NEGATIVE
KETONES UA: NEGATIVE
LEUKOCYTES UA: NEGATIVE
Nitrite, UA: NEGATIVE
PH UA: 6.5
Protein, UA: NEGATIVE
Urobilinogen, UA: 0.2

## 2013-12-26 LAB — VITAMIN B12: VITAMIN B 12: 501 pg/mL (ref 211–911)

## 2013-12-26 LAB — TSH: TSH: 1.629 u[IU]/mL (ref 0.350–4.500)

## 2013-12-26 MED ORDER — CLARITHROMYCIN ER 500 MG PO TB24: 1000.0000 mg | ORAL_TABLET | Freq: Every day | ORAL | Status: DC

## 2013-12-26 MED ORDER — HYDROCODONE-ACETAMINOPHEN 7.5-325 MG PO TABS: 1.0000 | ORAL_TABLET | Freq: Four times a day (QID) | ORAL | Status: DC | PRN

## 2013-12-26 NOTE — Patient Instructions (Signed)
1.  CALL [place].   Keeping You Healthy  Get These Tests  Blood Pressure- Have your blood pressure checked by your healthcare provider at least once a year.  Normal blood pressure is 120/80.  Weight- Have your body mass index (BMI) calculated to screen for obesity.  BMI is a measure of body fat based on height and weight.  You can calculate your own BMI at [employer]velBags.it  Cholesterol- Have your cholesterol checked every year.  Diabetes- Have your blood sugar checked every year if you have high blood pressure, high cholesterol, a family history of diabetes or if you are overweight.  Pap Smear- Have a pap smear every 1 to 3 years if you have been sexually active.  If you are older than 65 and recent pap smears have been normal you may not need additional pap smears.  In addition, if you have had a hysterectomy  For benign disease additional pap smears are not necessary.  Mammogram-Yearly mammograms are essential for early detection of breast cancer  Screening for Colon Cancer- Colonoscopy starting at age 43. Screening may begin sooner depending on your family history and other health conditions.  Follow up colonoscopy as directed by your Gastroenterologist.  Screening for Osteoporosis- Screening begins at age 51 with bone density scanning, sooner if you are at higher risk for developing Osteoporosis.  Get these medicines  Calcium with Vitamin D- Your body requires 1200-1500 mg of Calcium a day and [phone] IU of Vitamin D a day.  You can only absorb 500 mg of Calcium at a time therefore Calcium must be taken in 2 or 3 separate doses throughout the day.  Hormones- Hormone therapy has been associated with increased risk for certain cancers and heart disease.  Talk to your healthcare provider about if you need relief from menopausal symptoms.  Aspirin- Ask your healthcare provider about taking Aspirin to prevent Heart Disease and Stroke.  Get these  Immuniztions  Flu shot- Every fall  Pneumonia shot- Once after the age of 47; if you are younger ask your healthcare provider if you need a pneumonia shot.  Tetanus- Every ten years.  Zostavax- Once after the age of 58 to prevent shingles.  Take these steps  Don't smoke- Your healthcare provider can help you quit. For tips on how to quit, ask your healthcare provider or go to www.smokefree.gov or call 1-800 QUIT-NOW.  Be physically active- Exercise 5 days a week for a minimum of 30 minutes.  If you are not already physically active, start slow and gradually work up to 30 minutes of moderate physical activity.  Try walking, dancing, bike riding, swimming, etc.  Eat a healthy diet- Eat a variety of healthy foods such as fruits, vegetables, whole grains, low fat milk, low fat cheeses, yogurt, lean meats, chicken, fish, eggs, dried beans, tofu, etc.  For more information go to www.thenutritionsource.org  Dental visit- Brush and floss teeth twice daily; visit your dentist twice a year.  Eye exam- Visit your Optometrist or Ophthalmologist yearly.  Drink alcohol in moderation- Limit alcohol intake to one drink or less a day.  Never drink and drive.  Depression- Your emotional health is as important as your physical health.  If you're feeling down or losing interest in things you normally enjoy, please talk to your healthcare provider.  Seat Belts- can save your life; always wear one  Smoke/Carbon Monoxide detectors- These detectors need to be installed on the appropriate level of your home.  Replace  batteries at least once a year.  Violence- If anyone is threatening or hurting you, please tell your healthcare provider.  Living Will/ Health care power of attorney- Discuss with your healthcare provider and family.

## 2013-12-26 NOTE — Progress Notes (Signed)
   Subjective:    Patient ID: Bethany Scott, female    DOB: 1955-05-08, 58 y.o.   MRN: [id number]  HPI    Review of Systems  Constitutional: Negative.   HENT: Positive for congestion, postnasal drip, rhinorrhea, sinus pressure and voice change.   Eyes: Negative.   Respiratory: Negative.   Cardiovascular: Negative.   Gastrointestinal: Negative.   Endocrine: Negative.   Genitourinary: Positive for dyspareunia.  Musculoskeletal: Positive for myalgias, back pain, joint swelling, arthralgias and gait problem.  Skin: Negative.   Allergic/Immunologic: Positive for environmental allergies.  Neurological: Positive for speech difficulty, numbness and headaches.       Speech- when migraines  Hematological: Bruises/bleeds easily.  Psychiatric/Behavioral: The patient is nervous/anxious.        Objective:   Physical Exam        Assessment & Plan:

## 2013-12-26 NOTE — Progress Notes (Addendum)
Subjective:    Patient ID: Bethany Scott, female    DOB: [birth year], 58 y.o.   MRN: [id number]  12/26/2013  Annual Exam   HPI This 57 y.o. female presents for Complete Physical examination.  Last physical:  01/02/2013. Pap smear:  hysterectomy Mammogram 2011 Colonoscopy:  10/2012 TDAP:  2008 Pneumovax:  2012 Zostavax: never Influenza: 10/2013   Osteoarthritis: s/p [facility] ortho consultation; s/p xrays of knees, hips, lower back.  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  Did not recommend change in medications; did recommend referral to [facility] for further evaluation.  Recommended Meloxicam, Tramadol, Vicodin.   Did not recommend change in medications.  Cries due to pain.  Toes are starting to become deformed/arthritic.  Tailbone hurts.  R hip is the worst area.  Moderate OA in all other joints.  L ankle pain; no R ankle pain. With pain medicine, can go with family; miserable without pain medication.  Plans to follow-up with Jefm Bryant.  [facility] did nothing.  Left [facility].  Taking Sulfasalazine two daily per Jefm Bryant for 6-8 months.  Pain is worsening.  Discussed steroid injections with [facility]; discussed several steroid injections in knees; s/p Synvisc in R knee.  Dr. Jefm Bryant has performed several in L bursa hip and thumb.  Changes in weather makes things worse.   2 Tramadol every 4-6 hours.  Hydrocodone taking qhs for sleep and sometimes qid.  +excessive nasal congestion with mild cough for one week. No fever/chills/sweats.  No ear pain or sore throat.    Review of Systems See attached.   Past Medical History  Diagnosis Date  . Allergy   . Anxiety   . Asthma   . Depression   . Diabetes mellitus without complication   . GERD (gastroesophageal reflux disease)   . Hyperlipidemia   . Migraine   . Gastric ulcer, unspecified as acute or chronic, without mention of hemorrhage, perforation, or obstruction   . Personal history of  colonic polyps   . Other chronic cystitis   . Obesity, unspecified   . Hypertension   . Arthritis     OA Knee; non-specific autoimmune process followed by [facility]/Rheumatology.   Past Surgical History  Procedure Laterality Date  . Cesarean section      x 2  . Tonsillectomy and adenoidectomy    . Sigmoid resection / rectopexy    . Bilateral oophorectomy  2001  . Neck surgery    . Nasal septum surgery    . Spine surgery    . Appendectomy    . Colon surgery    . Rheumatology consult  11/26/2011    multiple arthralgias. Autoimmune labs negative.  Rx for Plaquenil for non-specific autoimmune process.  [name].  . Esophagogastroduodenoscopy  09/08/2012    +HH.  [name].  Symptoms: anemia, hemoccult +.  . Colonoscopy  09/08/2012    diverticulosis, hemorrhoids.  [name].  Symptoms:  anemia, hemoccult +.  . Abdominal hysterectomy  01/25/1994    uterine fibroids; DUB; cervical dysplasia; ovaries resected two years later.     Allergies  Allergen Reactions  . Cephalosporins Anaphylaxis  . Penicillins Anaphylaxis   Current Outpatient Prescriptions  Medication Sig Dispense Refill  . ALPRAZolam (XANAX) 0.5 MG tablet TAKE 1 TABLET EVERY DAY AS NEEDED 30 tablet 5  . azelastine (ASTELIN) 0.1 % nasal spray     . Blood Glucose Monitoring Suppl KIT 1 each by Does not apply route as needed. Please  dispense the monitor and supplies compliant with pt insurance ONETOUCH 100 each 11  . cetirizine (ZYRTEC) 10 MG tablet Take 10 mg by mouth daily.    . cholecalciferol (VITAMIN D) 1000 UNITS tablet Take 1,000 Units by mouth daily.    . fluticasone (FLONASE) 50 MCG/ACT nasal spray     . Fluticasone-Salmeterol (ADVAIR DISKUS) 100-50 MCG/DOSE AEPB Inhale 1 puff into the lungs every 12 (twelve) hours.    . hydrocortisone (ANUSOL-HC) 25 MG suppository PLACE 1 SUPPOSITORY (25 MG TOTAL) RECTALLY 2 (TWO) TIMES DAILY AS NEEDED FOR HEMORRHOIDS OR ITCHING. 12 suppository 2  . lisinopril (PRINIVIL,ZESTRIL)  5 MG tablet TAKE 1 TABLET (5 MG TOTAL) BY MOUTH DAILY. 30 tablet 5  . meloxicam (MOBIC) 15 MG tablet     . montelukast (SINGULAIR) 10 MG tablet TAKE 1 TABLET BY MOUTH DAILY 30 tablet 8  . Multiple Vitamin (MULTIVITAMIN) capsule Take 1 capsule by mouth daily.    . nitrofurantoin, macrocrystal-monohydrate, (MACROBID) 100 MG capsule TAKE 1 CAPSULE (100 MG TOTAL) BY MOUTH 2 (TWO) TIMES DAILY AS NEEDED. 30 capsule 2  . pantoprazole (PROTONIX) 40 MG tablet TAKE 1 TABLET BY MOUTH 2 TIMES DAILY. 60 tablet 1  . phenazopyridine (PYRIDIUM) 100 MG tablet Take 1 tablet (100 mg total) by mouth 3 (three) times daily as needed for pain. 30 tablet 2  . rizatriptan (MAXALT) 10 MG tablet Take 10 mg by mouth as needed. May repeat in 2 hours if needed    . sulfaSALAzine (AZULFIDINE) 500 MG tablet Take 500 mg by mouth 4 (four) times daily.    [name] venlafaxine XR (EFFEXOR-XR) 75 MG 24 hr capsule Take 3 capsules (225 mg total) by mouth daily with breakfast. 90 capsule 11  . clarithromycin (BIAXIN XL) 500 MG 24 hr tablet Take 2 tablets (1,000 mg total) by mouth daily. 20 tablet 0  . HYDROcodone-acetaminophen (NORCO) 7.5-325 MG per tablet Take 1 tablet by mouth every 8 (eight) hours as needed for moderate pain. 90 tablet 0  . lisinopril (PRINIVIL,ZESTRIL) 5 MG tablet TAKE 1 TABLET BY MOUTH EVERY DAY 30 tablet 1  . lisinopril (PRINIVIL,ZESTRIL) 5 MG tablet TAKE 1 TABLET BY MOUTH EVERY DAY 30 tablet 5  . pantoprazole (PROTONIX) 40 MG tablet TAKE 1 TABLET BY MOUTH TWICE A DAY 60 tablet 11  . promethazine (PHENERGAN) 25 MG tablet TAKE 1 TABLET BY MOUTH EVERY 6 HOURS AS NEEDED FOR NAUSEA AND VOMITING 40 tablet 3  . traMADol (ULTRAM) 50 MG tablet TAKE 1 TO 2 TABLETS BY MOUTH EVERY 6 HOURS AS NEEDED 150 tablet 2   No current facility-administered medications for this visit.   History   Social History  . Marital Status: Married    Spouse Name: N/A    Number of Children: 2  . Years of Education: 12   Occupational History  .  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 Smoking status: Former Research scientist (life sciences)  . Smokeless tobacco: Never Used     Comment: Quit 26 years ago  . Alcohol Use: Yes     Comment: rarley 3 times per year or less  . Drug Use: No  . Sexual Activity: Yes   Other Topics Concern  . Not on file   Social History Narrative   Marital status: married since 5 years; happily married; no abuse.      Children: 2 sons (21, 30); 1 granddaughter (86 months)      Lives:  with husband.      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      Tobacco abuse:  Former smoker.      Alcohol:  none      Exercise: none   Family History  Problem Relation Age of Onset  . Bipolar disorder Father   . Transient ischemic attack Father   . Dementia Father     secondary to head trauma  . Hypertension Father   . Diabetes Father   . Cancer Mother 49    lung  . Migraines Mother   . Hypertension Sister   . Atrial fibrillation Sister   . Alcohol abuse Brother   . Hypertension Brother   . Alcohol abuse Brother   . Atrial fibrillation Maternal Grandmother   . COPD Maternal Grandmother   . Diabetes Maternal Grandmother   . Hyperlipidemia Maternal Grandmother   . Diabetes Maternal Grandfather   . Diabetes Paternal Grandfather   . Heart disease Paternal Grandfather        Objective:    BP 116/80 mmHg  Pulse 93  Temp(Src) 97.9 F (36.6 C) (Oral)  Resp 16  Ht 5' 4.5" (1.638 m)  Wt 232 lb 3.2 oz (105.325 kg)  BMI 39.26 kg/m2  SpO2 97% Physical Exam  Constitutional: She is oriented to person, place, and time. She appears well-developed and well-nourished. No distress.  HENT:  Head: Normocephalic and atraumatic.  Right Ear: External ear normal.  Left Ear: External ear normal.  Nose: Mucosal edema and rhinorrhea present. Right sinus exhibits maxillary sinus tenderness and frontal sinus tenderness. Left sinus exhibits maxillary sinus  tenderness and frontal sinus tenderness.  Mouth/Throat: Oropharynx is clear and moist.  Eyes: Conjunctivae and EOM are normal. Pupils are equal, round, and reactive to light.  Neck: Normal range of motion and full passive range of motion without pain. Neck supple. No JVD present. Carotid bruit is not present. No thyromegaly present.  Cardiovascular: Normal rate, regular rhythm and normal heart sounds.  Exam reveals no gallop and no friction rub.   No murmur heard. Pulmonary/Chest: Effort normal and breath sounds normal. She has no wheezes. She has no rales. Right breast exhibits no inverted nipple, no mass, no nipple discharge, no skin change and no tenderness. Left breast exhibits no inverted nipple, no mass, no nipple discharge, no skin change and no tenderness. Breasts are symmetrical.  Abdominal: Soft. Bowel sounds are normal. She exhibits no distension and no mass. There is no tenderness. There is no rebound and no guarding.  Genitourinary: Vagina normal. There is no rash, tenderness or lesion on the right labia. There is no rash, tenderness or lesion on the left labia. Right adnexum displays no mass, no tenderness and no fullness. Left adnexum displays no mass, no tenderness and no fullness.  Musculoskeletal:       Right shoulder: Normal.       Left shoulder: Normal.       Cervical back: Normal.  Lymphadenopathy:    She has no cervical adenopathy.  Neurological: She is alert and oriented to person, place, and time. She has normal reflexes. No cranial nerve deficit. She exhibits normal muscle tone. Coordination normal.  Skin: Skin is warm and dry. No rash noted. She is not diaphoretic. No erythema. No pallor.  Psychiatric: She has a normal mood and affect. Her behavior is normal. Judgment and thought content normal.  Nursing note and vitals reviewed.       Assessment &  Plan:   1. Routine general medical examination at a health care facility   2. Essential hypertension   3. Type 2  diabetes mellitus without complication   4. Pure hypercholesterolemia   5. Encounter for routine gynecological examination   6. Acute maxillary sinusitis, recurrence not specified   7. Chronic pain syndrome      1. Complete Physical Examination: anticipatory guidance--- exercise, weight loss. s/p hysterectomy for cervical dysplasia; s/p pap smear today.  Mammogram overdue; will make referral.  Colonoscopy UTD.  Immunizations UTD.   2. Gynecological exam: completed; refer for mammogram.  S/p hysterectomy for reported cervical dysplasia in 1996; pap smear obtained. 3.  HTN: controlled; obtain labs; continue current medications. 4.  DMII: controlled; obtain labs.  Recommend annual eye exam.  Continue with dietary modification for treatment.  5. Dyslipidemia: controlled off of medication. 6.  Acute maxillary sinusitis:  New. Rx for Biaxin provided. Continue maintenance medications. 7.  Chronic pain syndrome: worsening; s/p ortho consult at [facility] who recommended referral to pain management. Also recommend patient follow-up with rheumatology.  Continue Tramadol and hydrocodone and will increase to qid dosing.  After follow-up with rheumatology, I will be happy to refer to pain management if [facility] did not place referral.   8. Depression and anxiety: worsening due to worsening pain; continue Effexor 230m daily.     Meds ordered this encounter  Medications  . DISCONTD: HYDROcodone-acetaminophen (NORCO) 7.5-325 MG per tablet    Sig: Take 1 tablet by mouth every 6 (six) hours as needed for moderate pain.    Dispense:  90 tablet    Refill:  0  . clarithromycin (BIAXIN XL) 500 MG 24 hr tablet    Sig: Take 2 tablets (1,000 mg total) by mouth daily.    Dispense:  20 tablet    Refill:  0    Return in about 3 months (around 03/27/2014) for recheck.    KReginia Forts M.D.  [facility]  [phone]

## 2013-12-27 LAB — CBC WITH DIFFERENTIAL/PLATELET
Basophils Absolute: 0 10*3/uL (ref 0.0–0.1)
Basophils Relative: 0 % (ref 0–1)
Eosinophils Absolute: 0.2 10*3/uL (ref 0.0–0.7)
Eosinophils Relative: 4 % (ref 0–5)
HCT: 41.5 % (ref 36.0–46.0)
Hemoglobin: 13.9 g/dL (ref 12.0–15.0)
LYMPHS ABS: 1.9 10*3/uL (ref 0.7–4.0)
Lymphocytes Relative: 36 % (ref 12–46)
MCH: 29.1 pg (ref 26.0–34.0)
MCHC: 33.5 g/dL (ref 30.0–36.0)
MCV: 87 fL (ref 78.0–100.0)
MPV: 8.7 fL — AB (ref 9.4–12.4)
Monocytes Absolute: 0.6 10*3/uL (ref 0.1–1.0)
Monocytes Relative: 11 % (ref 3–12)
Neutro Abs: 2.6 10*3/uL (ref 1.7–7.7)
Neutrophils Relative %: 49 % (ref 43–77)
Platelets: 259 10*3/uL (ref 150–400)
RBC: 4.77 MIL/uL (ref 3.87–5.11)
RDW: 13.1 % (ref 11.5–15.5)
WBC: 5.3 10*3/uL (ref 4.0–10.5)

## 2013-12-27 LAB — HEMOGLOBIN A1C
HEMOGLOBIN A1C: 5.8 % — AB (ref <5.7)
Mean Plasma Glucose: 120 mg/dL — ABNORMAL HIGH (ref <117)

## 2013-12-27 LAB — PAP IG (IMAGE GUIDED)

## 2013-12-27 LAB — MICROALBUMIN, URINE: MICROALB UR: 0.3 mg/dL (ref <2.0)

## 2013-12-27 LAB — VITAMIN D 25 HYDROXY (VIT D DEFICIENCY, FRACTURES): VIT D 25 HYDROXY: 39 ng/mL (ref 30–100)

## 2014-01-01 ENCOUNTER — Encounter: Payer: Self-pay | Admitting: Family Medicine

## 2014-01-03 ENCOUNTER — Other Ambulatory Visit: Payer: Self-pay | Admitting: Family Medicine

## 2014-01-12 ENCOUNTER — Other Ambulatory Visit: Payer: Self-pay | Admitting: Family Medicine

## 2014-01-13 ENCOUNTER — Encounter: Payer: Self-pay | Admitting: Family Medicine

## 2014-01-14 NOTE — Telephone Encounter (Signed)
Dr Tamala Julian, pt just had an annual PE w/you this mos, but don't see these Rxs discussed. Do you want to RF?

## 2014-01-14 NOTE — Telephone Encounter (Signed)
Refills sent to pharmacy. 

## 2014-01-14 NOTE — Telephone Encounter (Signed)
OK to refill hydrocodone for patient for chronic arthralgias, OA knees.  (Followed by rheumatology and ortho).  I am out of town for one week; will you please refill medication for me in my absence.

## 2014-01-15 ENCOUNTER — Other Ambulatory Visit: Payer: Self-pay | Admitting: Emergency Medicine

## 2014-01-15 MED ORDER — HYDROCODONE-ACETAMINOPHEN 7.5-325 MG PO TABS: 1.0000 | ORAL_TABLET | Freq: Four times a day (QID) | ORAL | Status: DC | PRN

## 2014-01-16 ENCOUNTER — Other Ambulatory Visit: Payer: Self-pay | Admitting: Family Medicine

## 2014-01-30 ENCOUNTER — Other Ambulatory Visit: Payer: Self-pay | Admitting: Family Medicine

## 2014-01-31 NOTE — Telephone Encounter (Signed)
Called in.

## 2014-01-31 NOTE — Telephone Encounter (Signed)
Please call in refill of Tramadol as approved. 

## 2014-02-11 ENCOUNTER — Encounter: Payer: Self-pay | Admitting: Family Medicine

## 2014-02-11 MED ORDER — HYDROCODONE-ACETAMINOPHEN 7.5-325 MG PO TABS: 1.0000 | ORAL_TABLET | Freq: Three times a day (TID) | ORAL | Status: DC | PRN

## 2014-02-12 NOTE — Telephone Encounter (Signed)
Pt's son p/up and signed for Rx.

## 2014-02-18 ENCOUNTER — Ambulatory Visit: Payer: Self-pay | Admitting: Family Medicine

## 2014-02-28 ENCOUNTER — Encounter: Payer: Self-pay | Admitting: *Deleted

## 2014-03-11 ENCOUNTER — Encounter: Payer: Self-pay | Admitting: Family Medicine

## 2014-03-12 ENCOUNTER — Encounter: Payer: Self-pay | Admitting: Family Medicine

## 2014-03-13 ENCOUNTER — Other Ambulatory Visit: Payer: Self-pay | Admitting: Family Medicine

## 2014-03-14 MED ORDER — HYDROCODONE-ACETAMINOPHEN 7.5-325 MG PO TABS: 1.0000 | ORAL_TABLET | Freq: Three times a day (TID) | ORAL | Status: DC | PRN

## 2014-03-20 ENCOUNTER — Encounter: Payer: Self-pay | Admitting: Family Medicine

## 2014-03-27 ENCOUNTER — Ambulatory Visit (INDEPENDENT_AMBULATORY_CARE_PROVIDER_SITE_OTHER): Payer: No Typology Code available for payment source | Admitting: Family Medicine

## 2014-03-27 ENCOUNTER — Encounter: Payer: Self-pay | Admitting: Family Medicine

## 2014-03-27 VITALS — BP 141/81 | HR 77 | Temp 98.6°F | Resp 16 | Ht 65.5 in | Wt 231.0 lb

## 2014-03-27 DIAGNOSIS — E785 Hyperlipidemia, unspecified: Secondary | ICD-10-CM

## 2014-03-27 DIAGNOSIS — J0100 Acute maxillary sinusitis, unspecified: Secondary | ICD-10-CM

## 2014-03-27 DIAGNOSIS — R7302 Impaired glucose tolerance (oral): Secondary | ICD-10-CM

## 2014-03-27 DIAGNOSIS — R319 Hematuria, unspecified: Secondary | ICD-10-CM | POA: Diagnosis not present

## 2014-03-27 DIAGNOSIS — I10 Essential (primary) hypertension: Secondary | ICD-10-CM

## 2014-03-27 DIAGNOSIS — F329 Major depressive disorder, single episode, unspecified: Secondary | ICD-10-CM

## 2014-03-27 DIAGNOSIS — M25569 Pain in unspecified knee: Secondary | ICD-10-CM | POA: Diagnosis not present

## 2014-03-27 DIAGNOSIS — G894 Chronic pain syndrome: Secondary | ICD-10-CM

## 2014-03-27 DIAGNOSIS — F418 Other specified anxiety disorders: Secondary | ICD-10-CM

## 2014-03-27 DIAGNOSIS — F419 Anxiety disorder, unspecified: Secondary | ICD-10-CM

## 2014-03-27 DIAGNOSIS — M25559 Pain in unspecified hip: Secondary | ICD-10-CM

## 2014-03-27 DIAGNOSIS — F32A Depression, unspecified: Secondary | ICD-10-CM

## 2014-03-27 LAB — COMPREHENSIVE METABOLIC PANEL
ALBUMIN: 4.5 g/dL (ref 3.5–5.2)
ALK PHOS: 59 U/L (ref 39–117)
ALT: 18 U/L (ref 0–35)
AST: 17 U/L (ref 0–37)
BUN: 10 mg/dL (ref 6–23)
CALCIUM: 9.8 mg/dL (ref 8.4–10.5)
CO2: 29 meq/L (ref 19–32)
Chloride: 102 mEq/L (ref 96–112)
Creat: 0.58 mg/dL (ref 0.50–1.10)
Glucose, Bld: 76 mg/dL (ref 70–99)
POTASSIUM: 4 meq/L (ref 3.5–5.3)
Sodium: 139 mEq/L (ref 135–145)
TOTAL PROTEIN: 7 g/dL (ref 6.0–8.3)
Total Bilirubin: 0.3 mg/dL (ref 0.2–1.2)

## 2014-03-27 LAB — CBC WITH DIFFERENTIAL/PLATELET
BASOS ABS: 0.1 10*3/uL (ref 0.0–0.1)
Basophils Relative: 1 % (ref 0–1)
Eosinophils Absolute: 0.3 10*3/uL (ref 0.0–0.7)
Eosinophils Relative: 5 % (ref 0–5)
HCT: 41.1 % (ref 36.0–46.0)
Hemoglobin: 13.6 g/dL (ref 12.0–15.0)
LYMPHS ABS: 2.9 10*3/uL (ref 0.7–4.0)
Lymphocytes Relative: 44 % (ref 12–46)
MCH: 30 pg (ref 26.0–34.0)
MCHC: 33.1 g/dL (ref 30.0–36.0)
MCV: 90.7 fL (ref 78.0–100.0)
MPV: 8.5 fL — AB (ref 8.6–12.4)
Monocytes Absolute: 0.5 10*3/uL (ref 0.1–1.0)
Monocytes Relative: 8 % (ref 3–12)
NEUTROS ABS: 2.8 10*3/uL (ref 1.7–7.7)
Neutrophils Relative %: 42 % — ABNORMAL LOW (ref 43–77)
PLATELETS: 267 10*3/uL (ref 150–400)
RBC: 4.53 MIL/uL (ref 3.87–5.11)
RDW: 13 % (ref 11.5–15.5)
WBC: 6.6 10*3/uL (ref 4.0–10.5)

## 2014-03-27 LAB — POCT UA - MICROSCOPIC ONLY
BACTERIA, U MICROSCOPIC: NEGATIVE
CRYSTALS, UR, HPF, POC: NEGATIVE
Casts, Ur, LPF, POC: NEGATIVE
MUCUS UA: NEGATIVE
WBC, Ur, HPF, POC: NEGATIVE
Yeast, UA: NEGATIVE

## 2014-03-27 LAB — HEMOGLOBIN A1C
Hgb A1c MFr Bld: 5.8 % — ABNORMAL HIGH (ref <5.7)
Mean Plasma Glucose: 120 mg/dL — ABNORMAL HIGH (ref <117)

## 2014-03-27 LAB — POCT URINALYSIS DIPSTICK
Bilirubin, UA: NEGATIVE
Glucose, UA: NEGATIVE
KETONES UA: NEGATIVE
Nitrite, UA: NEGATIVE
Protein, UA: NEGATIVE
SPEC GRAV UA: 1.015
UROBILINOGEN UA: 0.2
pH, UA: 7

## 2014-03-27 LAB — LIPID PANEL
CHOL/HDL RATIO: 5.7 ratio
CHOLESTEROL: 241 mg/dL — AB (ref 0–200)
HDL: 42 mg/dL — AB (ref >=46)
LDL CALC: 168 mg/dL — AB (ref 0–99)
TRIGLYCERIDES: 155 mg/dL — AB (ref <150)
VLDL: 31 mg/dL (ref 0–40)

## 2014-03-27 MED ORDER — HYDROCODONE-ACETAMINOPHEN 7.5-325 MG PO TABS: 1.0000 | ORAL_TABLET | Freq: Three times a day (TID) | ORAL | Status: DC | PRN

## 2014-03-27 MED ORDER — CLARITHROMYCIN 500 MG PO TABS: 500.0000 mg | ORAL_TABLET | Freq: Two times a day (BID) | ORAL | Status: DC

## 2014-03-27 NOTE — Progress Notes (Signed)
Subjective:    Patient ID: Bethany Scott, female    DOB: 01-Oct-1955, 59 y.o.   MRN: [id number]  03/27/2014  Follow-up; Diabetes; Hypertension; Hyperlipidemia; and Depression   HPI This 59 y.o. female presents for three month evaluation of the following:  1.  Chronic pain syndrome and arthralgias: no follow-up with Dr. Jefm Bryant.  S/p evaluation by [place].  Ortho recommended pain management.  Taking Vicodin tid orTramadol qid.   Managing the pain well.  Not taking Tramadol or Hydrocodone on same days.  Sleeping well.  Still hurting.  About 1/2 way through the diet pepsi thing; not good for bones.   Current pain in L hip and down the leg; leg will just ache; s/p several injections in L hip at [facility].  Worse at night when trying to sleep.   R knee pain; pain moves.  Feet, toes, wrists.  Pain is not all day as long as takes something.  Did contact Jefm Bryant but had to cancel it; so understaffed at work.  Had a nurse leave and had another clerical staff left up front.  Still taking Sulfasalazine two every morning.  Also taking supplements.   Cannot afford PT for hip pain; has exercises that have been provided to patient by ortho and rheumatology.  Feels much better.  Able to function currently.  Scheduled appointment with rheumatology but had to cancel due to shortages at work.  2.  HTN:  Patient reports good compliance with medication, good tolerance to medication, and good symptom control.  Denies CP/palp/SOB/leg swelling.  Not checking BP at home.   3. Glucose Intolerance:  Working on dietary modification.   4. Anxiety and depression:  Emotionally better; Derek Jack is a supplement that patient is interested in starting.  Husband owns [employer].  Mito32mx (acetyl L-carnitine, alpha lipoic acid, coenzyme Q10; oligonol, quercetin, cordyceps, ginseng, ksm 66 ashwaganda).  Taking Xanax in evening PRN.  Does not take every day.  Cannot shut off brain at night.  3 Effexors right now.      5.  Allergic Rhinitis:  Stable.  No recent issues other than acute URI.  6. URI:  Both grandchildren sick.  The weather changes also trigger patient's congestion.  Started five days ago; continues to worsen.  No fever/chills/sweats.   +n/v last night but think due to PND.  No ST; +hoarseness.  No ear pain.  +sinus pressure and R temple pain; +Pressure.  +rhinorrhea; +nasal congestion.  Dry cough.    7. Hyperlipidemia:  Ate light soup for lunch.  Requesting repeat FLP today.   Review of Systems  Constitutional: Negative for fever, chills, diaphoresis and fatigue.  HENT: Positive for congestion, ear pain, postnasal drip, rhinorrhea, sinus pressure, sore throat and voice change.   Eyes: Negative for visual disturbance.  Respiratory: Positive for cough. Negative for shortness of breath, wheezing and stridor.   Cardiovascular: Negative for chest pain, palpitations and leg swelling.  Gastrointestinal: Negative for nausea, vomiting, abdominal pain, diarrhea and constipation.  Endocrine: Negative for cold intolerance, heat intolerance, polydipsia, polyphagia and polyuria.  Musculoskeletal: Positive for back pain and arthralgias. Negative for myalgias, joint swelling, neck pain and neck stiffness.  Skin: Negative for color change, pallor, rash and wound.  Neurological: Negative for dizziness, tremors, seizures, syncope, facial asymmetry, speech difficulty, weakness, light-headedness, numbness and headaches.  Psychiatric/Behavioral: Positive for sleep disturbance. Negative for suicidal ideas, self-injury and dysphoric mood. The patient is not nervous/anxious.     Past Medical History  Diagnosis Date  .  Allergy   . Anxiety   . Asthma   . Depression   . Diabetes mellitus without complication   . GERD (gastroesophageal reflux disease)   . Hyperlipidemia   . Migraine   . Gastric ulcer, unspecified as acute or chronic, without mention of hemorrhage, perforation, or obstruction   . Personal  history of colonic polyps   . Other chronic cystitis   . Obesity, unspecified   . Hypertension   . Arthritis     OA Knee; non-specific autoimmune process followed by [facility]/Rheumatology.   Past Surgical History  Procedure Laterality Date  . Cesarean section      x 2  . Tonsillectomy and adenoidectomy    . Sigmoid resection / rectopexy    . Bilateral oophorectomy  2001  . Neck surgery    . Nasal septum surgery    . Spine surgery    . Appendectomy    . Colon surgery    . Rheumatology consult  11/26/2011    multiple arthralgias. Autoimmune labs negative.  Rx for Plaquenil for non-specific autoimmune process.  [name].  . Esophagogastroduodenoscopy  09/08/2012    +HH.  [name].  Symptoms: anemia, hemoccult +.  . Colonoscopy  09/08/2012    diverticulosis, hemorrhoids.  [name].  Symptoms:  anemia, hemoccult +.  . Abdominal hysterectomy  01/25/1994    uterine fibroids; DUB; cervical dysplasia; ovaries resected two years later.     Allergies  Allergen Reactions  . Cephalosporins Anaphylaxis  . Penicillins Anaphylaxis   Current Outpatient Prescriptions  Medication Sig Dispense Refill  . ALPRAZolam (XANAX) 0.5 MG tablet TAKE 1 TABLET EVERY DAY AS NEEDED 30 tablet 5  . azelastine (ASTELIN) 0.1 % nasal spray     . Blood Glucose Monitoring Suppl KIT 1 each by Does not apply route as needed. Please dispense the monitor and supplies compliant with pt insurance ONETOUCH 100 each 11  . cetirizine (ZYRTEC) 10 MG tablet Take 10 mg by mouth daily.    . cholecalciferol (VITAMIN D) 1000 UNITS tablet Take 1,000 Units by mouth daily.    . fluticasone (FLONASE) 50 MCG/ACT nasal spray     . Fluticasone-Salmeterol (ADVAIR DISKUS) 100-50 MCG/DOSE AEPB Inhale 1 puff into the lungs every 12 (twelve) hours.    [name] HYDROcodone-acetaminophen (NORCO) 7.5-325 MG per tablet Take 1 tablet by mouth every 8 (eight) hours as needed for moderate pain. 90 tablet 0  . lisinopril (PRINIVIL,ZESTRIL) 5 MG  tablet TAKE 1 TABLET (5 MG TOTAL) BY MOUTH DAILY. 30 tablet 5  . meloxicam (MOBIC) 15 MG tablet     . montelukast (SINGULAIR) 10 MG tablet TAKE 1 TABLET BY MOUTH DAILY 30 tablet 8  . Multiple Vitamin (MULTIVITAMIN) capsule Take 1 capsule by mouth daily.    . nitrofurantoin, macrocrystal-monohydrate, (MACROBID) 100 MG capsule TAKE 1 CAPSULE (100 MG TOTAL) BY MOUTH 2 (TWO) TIMES DAILY AS NEEDED. 30 capsule 2  . pantoprazole (PROTONIX) 40 MG tablet TAKE 1 TABLET BY MOUTH TWICE A DAY 60 tablet 11  . phenazopyridine (PYRIDIUM) 100 MG tablet Take 1 tablet (100 mg total) by mouth 3 (three) times daily as needed for pain. 30 tablet 2  . promethazine (PHENERGAN) 25 MG tablet TAKE 1 TABLET BY MOUTH EVERY 6 HOURS AS NEEDED FOR NAUSEA AND VOMITING 40 tablet 3  . rizatriptan (MAXALT) 10 MG tablet Take 10 mg by mouth as needed. May repeat in 2 hours if needed    . sulfaSALAzine (AZULFIDINE) 500 MG tablet Take 500 mg by  mouth 4 (four) times daily.    . traMADol (ULTRAM) 50 MG tablet TAKE 1 TO 2 TABLETS BY MOUTH EVERY 6 HOURS AS NEEDED 150 tablet 2  . venlafaxine XR (EFFEXOR-XR) 75 MG 24 hr capsule Take 3 capsules (225 mg total) by mouth daily with breakfast. 90 capsule 11  . clarithromycin (BIAXIN) 500 MG tablet Take 1 tablet (500 mg total) by mouth 2 (two) times daily. 20 tablet 0   No current facility-administered medications for this visit.       Objective:    BP 141/81 mmHg  Pulse 77  Temp(Src) 98.6 F (37 C)  Resp 16  Ht 5' 5.5" (1.664 m)  Wt 231 lb (104.781 kg)  BMI 37.84 kg/m2  SpO2 97% Physical Exam  Constitutional: She is oriented to person, place, and time. She appears well-developed and well-nourished. No distress.  HENT:  Head: Normocephalic and atraumatic.  Right Ear: External ear normal.  Left Ear: External ear normal.  Nose: Mucosal edema and rhinorrhea present. Right sinus exhibits maxillary sinus tenderness. Right sinus exhibits no frontal sinus tenderness. Left sinus exhibits  maxillary sinus tenderness. Left sinus exhibits no frontal sinus tenderness.  Mouth/Throat: Oropharynx is clear and moist.  Eyes: Conjunctivae and EOM are normal. Pupils are equal, round, and reactive to light.  Neck: Normal range of motion. Neck supple. Carotid bruit is not present. No thyromegaly present.  Cardiovascular: Normal rate, regular rhythm, normal heart sounds and intact distal pulses.  Exam reveals no gallop and no friction rub.   No murmur heard. Pulmonary/Chest: Effort normal and breath sounds normal. She has no wheezes. She has no rales.  Abdominal: Soft. Bowel sounds are normal. She exhibits no distension and no mass. There is no tenderness. There is no rebound and no guarding.  Lymphadenopathy:    She has no cervical adenopathy.  Neurological: She is alert and oriented to person, place, and time. No cranial nerve deficit.  Skin: Skin is warm and dry. No rash noted. She is not diaphoretic. No erythema. No pallor.  Psychiatric: She has a normal mood and affect. Her behavior is normal.        Assessment & Plan:   1. Essential hypertension, benign   2. Hematuria   3. Glucose intolerance (impaired glucose tolerance)   4. Hyperlipidemia   5. Anxiety and depression   6. Chronic arthralgias of knees and hips, unspecified laterality   7. Chronic pain syndrome   8. Acute maxillary sinusitis, recurrence not specified     1. HTN: moderately control; obtain labs; no changes to management. 2.  Hyperlipidemia: worsening at last visit; repeat today per patient request; may warrant restarting medication. 3. Glucose intolerance: stable; obtain labs; continue with dietary modification; recommend weight loss and exercise. 4.  Anxiety and depression: improved with improvement in chronic pain management.  No changes to therapy today. 5.  Chronic arthralgias: improved with scheduled narcotics; non-compliance with follow-up with rheumatology; highly encourage patient to reschedule with Dr.  [name].  Current regimen of Sulfasalazine not controlling symptoms.  Requiring narcotics and Tramadol.  6.  Chronic pain syndrome: improved with scheduled medication; will refer to pain management in [place]. 7. Acute maxillary sinusitis: New.  Rx for Biaxin provided.  Continue nasal sprays.   Meds ordered this encounter  Medications  . DISCONTD: HYDROcodone-acetaminophen (NORCO) 7.5-325 MG per tablet    Sig: Take 1 tablet by mouth every 8 (eight) hours as needed for moderate pain.    Dispense:  90 tablet  Refill:  0    DO NOT FILL UNTIL 04-12-2014  . DISCONTD: HYDROcodone-acetaminophen (NORCO) 7.5-325 MG per tablet    Sig: Take 1 tablet by mouth every 8 (eight) hours as needed for moderate pain.    Dispense:  90 tablet    Refill:  0    DO NOT FILL UNTIL 05-13-2014  . HYDROcodone-acetaminophen (NORCO) 7.5-325 MG per tablet    Sig: Take 1 tablet by mouth every 8 (eight) hours as needed for moderate pain.    Dispense:  90 tablet    Refill:  0    DO NOT FILL UNTIL 06-12-2014  . clarithromycin (BIAXIN) 500 MG tablet    Sig: Take 1 tablet (500 mg total) by mouth 2 (two) times daily.    Dispense:  20 tablet    Refill:  0    Return in about 3 months (around 06/27/2014) for recheck.     Donie Moulton Elayne Guerin, M.D. [facility] [address] [place], [place]  [address] [phone] phone [phone] fax

## 2014-04-06 ENCOUNTER — Other Ambulatory Visit: Payer: Self-pay | Admitting: Family Medicine

## 2014-04-07 NOTE — Telephone Encounter (Signed)
If pt is taking at max dose, she could be out of RFs by now.

## 2014-04-09 ENCOUNTER — Encounter: Payer: Self-pay | Admitting: Family Medicine

## 2014-04-09 NOTE — Telephone Encounter (Signed)
Call patient --- upon review of refills of Tramadol for the past six months, she is filling 100 Tramadol every three weeks which is consistent with 3-4 Tramadol every day.  She has also filled Hydrocodone every 3-4 weeks which is 3 hydrocodone every day.  Thus, I think we should stick to just one medication.  I recommend taking hydrocodone tid scheduled only.  Let's try that for now.  I am not refilling Tramadol.

## 2014-04-10 NOTE — Telephone Encounter (Signed)
Called pt and explained reason for refusal. Discussed Dr Thompson Caul recommendation of hydrocodone sch and pt agreed to try.

## 2014-04-20 ENCOUNTER — Other Ambulatory Visit: Payer: Self-pay | Admitting: Family Medicine

## 2014-04-21 ENCOUNTER — Encounter: Payer: Self-pay | Admitting: Family Medicine

## 2014-04-23 ENCOUNTER — Encounter: Payer: Self-pay | Admitting: Family Medicine

## 2014-04-23 MED ORDER — TRAMADOL HCL 50 MG PO TABS: 50.0000 mg | ORAL_TABLET | Freq: Four times a day (QID) | ORAL | Status: DC | PRN

## 2014-04-23 NOTE — Telephone Encounter (Signed)
Called in refill to [facility] pharmacy.

## 2014-04-24 NOTE — Telephone Encounter (Signed)
Called in Rx

## 2014-04-25 ENCOUNTER — Other Ambulatory Visit: Payer: Self-pay | Admitting: Family Medicine

## 2014-04-26 NOTE — Telephone Encounter (Signed)
Faxed

## 2014-04-26 NOTE — Telephone Encounter (Signed)
Please call in or fax refill of Xanax.

## 2014-04-30 ENCOUNTER — Encounter: Payer: Self-pay | Admitting: Family Medicine

## 2014-05-02 ENCOUNTER — Ambulatory Visit
Admit: 2014-05-02 | Disposition: A | Discharge status: Home / Self Care | Payer: Self-pay | Attending: Pain Medicine | Admitting: Pain Medicine

## 2014-05-02 ENCOUNTER — Encounter: Payer: Self-pay | Admitting: Family Medicine

## 2014-05-02 ENCOUNTER — Telehealth: Payer: Self-pay | Admitting: Family Medicine

## 2014-05-02 NOTE — Telephone Encounter (Signed)
Please confirm that Xanax rx has been called into pharmacy.

## 2014-05-03 ENCOUNTER — Telehealth: Payer: Self-pay

## 2014-05-03 NOTE — Telephone Encounter (Signed)
Called pharmacy, pt did receive Rx on the 6th.

## 2014-05-03 NOTE — Telephone Encounter (Signed)
Pt of Dr. Lorelei Pont requesting refill on HYDROcodone-acetaminophen (NORCO) 7.5-325 MG per tablet [[id number]]

## 2014-05-03 NOTE — Telephone Encounter (Signed)
Call --- I provided patient with 3 rx for Hydrocodone at 03/2014 visit; she should not need a refill of Hydrocodone until 06/2014.

## 2014-05-06 MED ORDER — HYDROCODONE-ACETAMINOPHEN 7.5-325 MG PO TABS: 1.0000 | ORAL_TABLET | Freq: Four times a day (QID) | ORAL | Status: DC | PRN

## 2014-05-06 NOTE — Telephone Encounter (Signed)
lmom of message and cb with questions.

## 2014-05-22 ENCOUNTER — Ambulatory Visit
Admit: 2014-05-22 | Disposition: A | Discharge status: Home / Self Care | Payer: Self-pay | Attending: Pain Medicine | Admitting: Pain Medicine

## 2014-05-27 ENCOUNTER — Telehealth: Payer: Self-pay | Admitting: Pain Medicine

## 2014-05-27 ENCOUNTER — Encounter: Payer: Self-pay | Admitting: Family Medicine

## 2014-05-27 NOTE — Telephone Encounter (Signed)
Wants to get knee injection on left side before June 1, insurance starts over June 1

## 2014-05-28 ENCOUNTER — Other Ambulatory Visit: Payer: Self-pay | Admitting: Family Medicine

## 2014-05-28 MED ORDER — TRAMADOL HCL 50 MG PO TABS: 50.0000 mg | ORAL_TABLET | Freq: Four times a day (QID) | ORAL | Status: DC | PRN

## 2014-05-28 NOTE — Telephone Encounter (Signed)
Please call in refill of Tramadol to pharmacy as approved. 

## 2014-05-28 NOTE — Telephone Encounter (Signed)
Please discuss with me.  Need to have Beverley Fiedler, Lelon Huh, Blanch Media, or [employer] and verify that ok to schedule procedure ASAP.

## 2014-05-29 NOTE — Telephone Encounter (Signed)
Rx called in myself at 13:14 on 05/29/14.  Originally approved on 05/28/14 at 11:30am.

## 2014-06-08 ENCOUNTER — Telehealth: Payer: Self-pay | Admitting: Family Medicine

## 2014-06-08 NOTE — Telephone Encounter (Signed)
lmom to call us to get information on her appt her appt was changed from 07/08/14 to 07/19/14 at 4:00

## 2014-06-15 ENCOUNTER — Other Ambulatory Visit: Payer: Self-pay | Admitting: Pain Medicine

## 2014-06-15 DIAGNOSIS — M5137 Other intervertebral disc degeneration, lumbosacral region: Secondary | ICD-10-CM

## 2014-06-15 DIAGNOSIS — M170 Bilateral primary osteoarthritis of knee: Secondary | ICD-10-CM

## 2014-06-17 ENCOUNTER — Encounter: Payer: Self-pay | Admitting: Pain Medicine

## 2014-06-17 ENCOUNTER — Ambulatory Visit: Payer: No Typology Code available for payment source | Attending: Pain Medicine | Admitting: Pain Medicine

## 2014-06-17 VITALS — BP 120/61 | HR 70 | Temp 98.6°F | Resp 16 | Ht 67.0 in | Wt 225.0 lb

## 2014-06-17 DIAGNOSIS — M170 Bilateral primary osteoarthritis of knee: Secondary | ICD-10-CM | POA: Diagnosis not present

## 2014-06-17 DIAGNOSIS — M461 Sacroiliitis, not elsewhere classified: Secondary | ICD-10-CM

## 2014-06-17 DIAGNOSIS — M179 Osteoarthritis of knee, unspecified: Secondary | ICD-10-CM | POA: Insufficient documentation

## 2014-06-17 DIAGNOSIS — M5136 Other intervertebral disc degeneration, lumbar region: Secondary | ICD-10-CM

## 2014-06-17 DIAGNOSIS — M25569 Pain in unspecified knee: Secondary | ICD-10-CM

## 2014-06-17 DIAGNOSIS — M7062 Trochanteric bursitis, left hip: Secondary | ICD-10-CM

## 2014-06-17 DIAGNOSIS — M25559 Pain in unspecified hip: Secondary | ICD-10-CM

## 2014-06-17 DIAGNOSIS — M47816 Spondylosis without myelopathy or radiculopathy, lumbar region: Secondary | ICD-10-CM

## 2014-06-17 DIAGNOSIS — M79605 Pain in left leg: Secondary | ICD-10-CM | POA: Diagnosis present

## 2014-06-17 DIAGNOSIS — M47818 Spondylosis without myelopathy or radiculopathy, sacral and sacrococcygeal region: Secondary | ICD-10-CM

## 2014-06-17 DIAGNOSIS — M1710 Unilateral primary osteoarthritis, unspecified knee: Secondary | ICD-10-CM | POA: Insufficient documentation

## 2014-06-17 DIAGNOSIS — M7061 Trochanteric bursitis, right hip: Secondary | ICD-10-CM

## 2014-06-17 DIAGNOSIS — M51369 Other intervertebral disc degeneration, lumbar region without mention of lumbar back pain or lower extremity pain: Secondary | ICD-10-CM | POA: Insufficient documentation

## 2014-06-17 DIAGNOSIS — M545 Low back pain: Secondary | ICD-10-CM | POA: Diagnosis present

## 2014-06-17 DIAGNOSIS — M79604 Pain in right leg: Secondary | ICD-10-CM | POA: Diagnosis present

## 2014-06-17 MED ORDER — ORPHENADRINE CITRATE 30 MG/ML IJ SOLN
INTRAMUSCULAR | Status: AC
  Administered 2014-06-17: 11:00:00
  Filled 2014-06-17: qty 2

## 2014-06-17 MED ORDER — MIDAZOLAM HCL 5 MG/5ML IJ SOLN
INTRAMUSCULAR | Status: AC
  Administered 2014-06-17: 5 mg via INTRAVENOUS
  Filled 2014-06-17: qty 5

## 2014-06-17 MED ORDER — BUPIVACAINE HCL (PF) 0.25 % IJ SOLN
INTRAMUSCULAR | Status: AC
  Administered 2014-06-17: 10:00:00
  Filled 2014-06-17: qty 30

## 2014-06-17 MED ORDER — FENTANYL CITRATE (PF) 100 MCG/2ML IJ SOLN
INTRAMUSCULAR | Status: AC
  Administered 2014-06-17: 100 ug
  Filled 2014-06-17: qty 2

## 2014-06-17 MED ORDER — TRIAMCINOLONE ACETONIDE 40 MG/ML IJ SUSP
INTRAMUSCULAR | Status: AC
  Administered 2014-06-17: 10:00:00
  Filled 2014-06-17: qty 1

## 2014-06-17 NOTE — Progress Notes (Signed)
Discharged via w/c at 1110 am. Tolerating po fluids well. Instructions (verbal and written) given to patient. Teachback x3.

## 2014-06-17 NOTE — Progress Notes (Signed)
   Subjective:    Patient ID: Bethany Scott, female    DOB: 1955-06-08, 59 y.o.   MRN: [id number]  HPI    Review of Systems     Objective:   Physical Exam        Assessment & Plan:

## 2014-06-17 NOTE — Progress Notes (Signed)
Geniculate nerve blocks of the left knee   The patient is a 59 y.o. female who returns to the [facility] for further evaluation and treatment of pain involving the lumbar lower extremity region with severe pain of the left knee. . Patient is with significant degenerative joint disease of the knee with severely disabling pain of the knees We will proceed with geniculate nerve blocks of the left knee in an attempt to decrease severity of symptoms, minimize the risk of medication escalation, hopefully retard progression of symptoms and avoid the need for more involved treatment.  The risks benefits and expectations of the procedure were discussed with the patient. The patient was with understanding and in agreement with suggested treatment plan.  DESCRIPTION OF PROCEDURE: Geniculate nerve blocks of the left knee. The  procedure was performed with IV Versed and IV fentanyl, conscious sedation and under fluoroscopic guidance.  NEEDLE PLACEMENT FOR BLOCK OF THE LATERAL SUPERIOR GENICULATE NERVE: The patient was taking to the fluoroscopy suite. With the patient supine, with knee in flexed position, Betadine prep of proposed entry site accomplished.  IV Versed, IV fentanyl conscious sedation, EKG, blood pressure, pulse and pulse oximetry monitoring were all in place. Under fluoroscopic guidance, a 22-gauge needle was inserted in the region of the left knee with needle placed lateral border of the femur at the junction of the shaft of the femur and the condyle of the femur.  Following needle placement at the lateral aspect of the knee, needle placement was then accomplished in the region of the medial aspect of the knee.  NEEDLE PLACEMENT FOR BLOCK OF THE MEDIAL SUPERIOR GENICULATE NERVE:  Under fluoroscopic guidance, a 22 - gauge needle was inserted in the region of the left knee with needle placed medial border of the femur at the junction of the shaft of the femur and the condyle of the femur.    NEEDLE PLACEMENT FOR BLOCK OF THE MEDIAL INFERIOR GENICULATE NERVE:  Under fluoroscopic guidance, a 22 - gauge needle was inserted in the region of the left knee with needle placed at the junction of the shaft and plateau of the tibia.   Following needle placement on AP view of needles placed in all three locations, placement was then verified on lateral view with the tips of the superior lateral and superior medial needles documented to be one half the distance of the shaft of the femur and the tip of the inferior medial geniculate needle documented to be one half the distance of the shaft of the tibia.  Following documentation of needle placements on lateral view, H needle was injected with one mL of 0.25% bupivacaine with Kenalog. A total of 10 mg of Kenalog was utilized for the entire procedure. The patient tolerated the procedure well.    PLAN 1. Medications: Continue present medications 2. Follow-up appointment with PCP for evaluation of blood pressure and general medical condition. 3. Follow-up surgical evaluation 4. Follow-up neurological evaluation 5. He patient may be a candidate for radiofrequency rhizolysis and other treatment pending response to treatment on today's visit and follow-up evaluation. 6. The patient is advised to adhere to proper body mechanics and avoid activities which appear to aggravate condition

## 2014-06-17 NOTE — Progress Notes (Signed)
Procedure start time 1022 Procedure end time 9432

## 2014-06-17 NOTE — Patient Instructions (Addendum)
Continue present medications and antibioticcs  F/U PCP for evaliation of  BP and general medical  condition.  F/U surgical evaluation.  F/U nrurological evaluation.  May consider radiofrequency rhizolysis or intraspinal procedures pending response to present treatment and F/U evaluation.  Patient to call Pain Management Center should patient have concerns prior to scheduled return appointment.     Pain Management Discharge Instructions  General Discharge Instructions :  If you need to reach your doctor call: Monday-Friday 8:00 am - 4:00 pm at [phone] or toll free [phone].  After clinic hours [phone] to have operator reach doctor.  Bring all of your medication bottles to all your appointments in the pain clinic.  To cancel or reschedule your appointment with Pain Management please remember to call 24 hours in advance to avoid a fee.  Refer to the educational materials which you have been given on: General Risks, I had my Procedure. Discharge Instructions, Post Sedation.  Post Procedure Instructions:  The drugs you were given will stay in your system until tomorrow, so for the next 24 hours you should not drive, make any legal decisions or drink any alcoholic beverages.  You may eat anything you prefer, but it is better to start with liquids then soups and crackers, and gradually work up to solid foods.  Please notify your doctor immediately if you have any unusual bleeding, trouble breathing or pain that is not related to your normal pain.  Depending on the type of procedure that was done, some parts of your body may feel week and/or numb.  This usually clears up by tonight or the next day.  Walk with the use of an assistive device or accompanied by an adult for the 24 hours.  You may use ice on the affected area for the first 24 hours.  Put ice in a Ziploc bag and cover with a towel and place against area 15 minutes on 15 minutes off.  You may switch to heat  after 24 hours.

## 2014-06-17 NOTE — Progress Notes (Signed)
   Subjective:    Patient ID: Bethany Scott, female    DOB: [birth year], 59 y.o.   MRN: [id number]  HPI    Review of Systems     Objective:   Physical Exam        Assessment & Plan:

## 2014-06-18 ENCOUNTER — Other Ambulatory Visit: Payer: Self-pay | Admitting: Family Medicine

## 2014-06-18 ENCOUNTER — Telehealth: Payer: Self-pay | Admitting: *Deleted

## 2014-06-18 NOTE — Telephone Encounter (Signed)
Denies any problems/concerns post procedure. 

## 2014-06-24 ENCOUNTER — Other Ambulatory Visit: Payer: Self-pay | Admitting: Pain Medicine

## 2014-06-24 ENCOUNTER — Other Ambulatory Visit: Payer: Self-pay | Admitting: Family Medicine

## 2014-06-25 ENCOUNTER — Encounter: Payer: Self-pay | Admitting: Pain Medicine

## 2014-06-25 ENCOUNTER — Ambulatory Visit: Payer: No Typology Code available for payment source | Attending: Pain Medicine | Admitting: Pain Medicine

## 2014-06-25 VITALS — BP 112/73 | HR 86 | Temp 98.6°F | Resp 18 | Ht 67.75 in | Wt 230.0 lb

## 2014-06-25 DIAGNOSIS — M5134 Other intervertebral disc degeneration, thoracic region: Secondary | ICD-10-CM | POA: Diagnosis not present

## 2014-06-25 DIAGNOSIS — M5030 Other cervical disc degeneration, unspecified cervical region: Secondary | ICD-10-CM | POA: Insufficient documentation

## 2014-06-25 DIAGNOSIS — M79604 Pain in right leg: Secondary | ICD-10-CM | POA: Diagnosis present

## 2014-06-25 DIAGNOSIS — M7060 Trochanteric bursitis, unspecified hip: Secondary | ICD-10-CM | POA: Insufficient documentation

## 2014-06-25 DIAGNOSIS — M545 Low back pain: Secondary | ICD-10-CM | POA: Diagnosis present

## 2014-06-25 DIAGNOSIS — M47816 Spondylosis without myelopathy or radiculopathy, lumbar region: Secondary | ICD-10-CM

## 2014-06-25 DIAGNOSIS — M79605 Pain in left leg: Secondary | ICD-10-CM | POA: Diagnosis present

## 2014-06-25 DIAGNOSIS — M7061 Trochanteric bursitis, right hip: Secondary | ICD-10-CM

## 2014-06-25 DIAGNOSIS — M5136 Other intervertebral disc degeneration, lumbar region: Secondary | ICD-10-CM

## 2014-06-25 DIAGNOSIS — M7062 Trochanteric bursitis, left hip: Secondary | ICD-10-CM

## 2014-06-25 DIAGNOSIS — M461 Sacroiliitis, not elsewhere classified: Secondary | ICD-10-CM

## 2014-06-25 DIAGNOSIS — M47818 Spondylosis without myelopathy or radiculopathy, sacral and sacrococcygeal region: Secondary | ICD-10-CM

## 2014-06-25 DIAGNOSIS — M170 Bilateral primary osteoarthritis of knee: Secondary | ICD-10-CM | POA: Diagnosis not present

## 2014-06-25 MED ORDER — HYDROCODONE-ACETAMINOPHEN 7.5-325 MG PO TABS: ORAL_TABLET | ORAL | Status: DC

## 2014-06-25 MED ORDER — TRAMADOL HCL 50 MG PO TABS: ORAL_TABLET | ORAL | Status: DC

## 2014-06-25 NOTE — Progress Notes (Signed)
Discharged to home ambulatory with script in hand for  Tramadol and Norco.  Pre procedure instructions given with teach back 3 done.

## 2014-06-25 NOTE — Progress Notes (Signed)
   Subjective:    Patient ID: Bethany Scott, female    DOB: 1955/09/03, 59 y.o.   MRN: [id number]  HPI   patient is 59 year old female returns to [facility] for further evaluation and treatment of pain involving the lower back lower extremity region especially the region of the knees. Patient is status post geniculate nerve blocks of the knees and states that she is doing very well at this time. We discussed patient's treatment regimen and will continue present medications and avoid additional interventional treatment at this time. The patient is understanding and in agreement with suggested treatment plan. The patient does have an element of bursitis which we will consider to the bursitis become's more severe and patient may call Pain Management Center to request scheduling for injection if she feels that such is necessary. We will continue present treatment as discussed and will remain available to modified treatment regimen pending follow-up evaluation. All understanding and in agreement with suggested treatment plan.     Review of Systems     Objective:   Physical Exam   mild tenderness of the splenius capitis and occipitalis regions. There was mild tenderness of the acromioclavicular and glenohumeral joint region. Palpation of the cervical and thoracic paraspinal musculature region and facet regions with tenderness to palpation of mild degree Tinel and Phalen's maneuver were without significantly increased pain. Grip strength appeared to be equal No crepitus of the thoracic region noted. Palpation over the region of the thoracic and lumbar regions positive tennis to palpation of mild to moderate degree.. Straight leg raising was tolerates approximately 30 without increased pain with dorsiflexion noted. The knees were crepitus of the knees with negative anterior and posterior drawer signs without ballottement of the patella. Increased warmth erythema of the knees noted. Mild  tenderness of the knees noted lower extremities were without evidence of sensory deficit of dermatomal distribution. 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 Theabdomen falls nontender and no costovertebral angle tenderness noted      Assessment & Plan:   Degenerative joint disease of knees   Degenerative disc disease of spine   Greater trochanteric bursitis     plan    Continue present medications.  F/U PCP for evaliation of  BP and general medical  condition.  F/U surgical evaluation.  F/U neurological evaluation.  May consider radiofrequency rhizolysis or intraspinal procedures pending response to present treatment and F/U evaluation.  Patient to call Pain Management Center should patient have concerns prior to scheduled return appointment.

## 2014-06-25 NOTE — Patient Instructions (Addendum)
Continue present medications.  F/U PCP for evaliation of  BP and general medical  condition.  F/U surgical evaluation.  F/U neurological evaluation.  May consider radiofrequency rhizolysis or intraspinal procedures pending response to present treatment and F/U evaluation.  Patient to call [facility] should patient have concerns prior to scheduled return appointment. GENERAL RISKS AND COMPLICATIONS  What are the risk, side effects and possible complications? Generally speaking, most procedures are safe.  However, with any procedure there are risks, side effects, and the possibility of complications.  The risks and complications are dependent upon the sites that are lesioned, or the type of nerve block to be performed.  The closer the procedure is to the spine, the more serious the risks are.  Great care is taken when placing the radio frequency needles, block needles or lesioning probes, but sometimes complications can occur. 1. Infection: Any time there is an injection through the skin, there is a risk of infection.  This is why sterile conditions are used for these blocks.  There are four possible types of infection. 1. Localized skin infection. 2. Central Nervous System Infection-This can be in the form of Meningitis, which can be deadly. 3. Epidural Infections-This can be in the form of an epidural abscess, which can cause pressure inside of the spine, causing compression of the spinal cord with subsequent paralysis. This would require an emergency surgery to decompress, and there are no guarantees that the patient would recover from the paralysis. 4. Discitis-This is an infection of the intervertebral discs.  It occurs in about 1% of discography procedures.  It is difficult to treat and it may lead to surgery.        2. Pain: the needles have to go through skin and soft tissues, will cause soreness.       3. Damage to internal structures:  The nerves to be lesioned may be near  blood vessels or    other nerves which can be potentially damaged.       4. Bleeding: Bleeding is more common if the patient is taking blood thinners such as  aspirin, Coumadin, Ticiid, Plavix, etc., or if he/she have some genetic predisposition  such as hemophilia. Bleeding into the spinal canal can cause compression of the spinal  cord with subsequent paralysis.  This would require an emergency surgery to  decompress and there are no guarantees that the patient would recover from the  paralysis.       5. Pneumothorax:  Puncturing of a lung is a possibility, every time a needle is introduced in  the area of the chest or upper back.  Pneumothorax refers to free air around the  collapsed lung(s), inside of the thoracic cavity (chest cavity).  Another two possible  complications related to a similar event would include: Hemothorax and Chylothorax.   These are variations of the Pneumothorax, where instead of air around the collapsed  lung(s), you may have blood or chyle, respectively.       6. Spinal headaches: They may occur with any procedures in the area of the spine.       7. Persistent CSF (Cerebro-Spinal Fluid) leakage: This is a rare problem, but may occur  with prolonged intrathecal or epidural catheters either due to the formation of a fistulous  track or a dural tear.       8. Nerve damage: By working so close to the spinal cord, there is always a possibility of  nerve damage, which could be  as serious as a permanent spinal cord injury with  paralysis.       9. Death:  Although rare, severe deadly allergic reactions known as "Anaphylactic  reaction" can occur to any of the medications used.      10. Worsening of the symptoms:  We can always make thing worse.  What are the chances of something like this happening? Chances of any of this occuring are extremely low.  By statistics, you have more of a chance of getting killed in a motor vehicle accident: while driving to the hospital than any of the  above occurring .  Nevertheless, you should be aware that they are possibilities.  In general, it is similar to taking a shower.  Everybody knows that you can slip, hit your head and get killed.  Does that mean that you should not shower again?  Nevertheless always keep in mind that statistics do not mean anything if you happen to be on the wrong side of them.  Even if a procedure has a 1 (one) in a 1,000,000 (million) chance of going wrong, it you happen to be that one..Also, keep in mind that by statistics, you have more of a chance of having something go wrong when taking medications.  Who should not have this procedure? If you are on a blood thinning medication (e.g. Coumadin, Plavix, see list of "Blood Thinners"), or if you have an active infection going on, you should not have the procedure.  If you are taking any blood thinners, please inform your physician.  How should I prepare for this procedure?  Do not eat or drink anything at least six hours prior to the procedure.  Bring a driver with you .  It cannot be a taxi.  Come accompanied by an adult that can drive you back, and that is strong enough to help you if your legs get weak or numb from the local anesthetic.  Take all of your medicines the morning of the procedure with just enough water to swallow them.  If you have diabetes, make sure that you are scheduled to have your procedure done first thing in the morning, whenever possible.  If you have diabetes, take only half of your insulin dose and notify our nurse that you have done so as soon as you arrive at the clinic.  If you are diabetic, but only take blood sugar pills (oral hypoglycemic), then do not take them on the morning of your procedure.  You may take them after you have had the procedure.  Do not take aspirin or any aspirin-containing medications, at least eleven (11) days prior to the procedure.  They may prolong bleeding.  Wear loose fitting clothing that may be easy  to take off and that you would not mind if it got stained with Betadine or blood.  Do not wear any jewelry or perfume  Remove any nail coloring.  It will interfere with some of our monitoring equipment.  NOTE: Remember that this is not meant to be interpreted as a complete list of all possible complications.  Unforeseen problems may occur.  BLOOD THINNERS The following drugs contain aspirin or other products, which can cause increased bleeding during surgery and should not be taken for 2 weeks prior to and 1 week after surgery.  If you should need take something for relief of minor pain, you may take acetaminophen which is found in Tylenol,m Datril, Anacin-3 and Panadol. It is not blood thinner. The products listed below  are.  Do not take any of the products listed below in addition to any listed on your instruction sheet.  A.P.C or A.P.C with Codeine Codeine Phosphate Capsules #3 Ibuprofen Ridaura  ABC compound Congesprin Imuran rimadil  Advil Cope Indocin Robaxisal  Alka-Seltzer Effervescent Pain Reliever and Antacid Coricidin or Coricidin-D  Indomethacin Rufen  Alka-Seltzer plus Cold Medicine Cosprin Ketoprofen S-A-C Tablets  Anacin Analgesic Tablets or Capsules Coumadin Korlgesic Salflex  Anacin Extra Strength Analgesic tablets or capsules CP-2 Tablets Lanoril Salicylate  Anaprox Cuprimine Capsules Levenox Salocol  Anexsia-D Dalteparin Magan Salsalate  Anodynos Darvon compound Magnesium Salicylate Sine-off  Ansaid Dasin Capsules Magsal Sodium Salicylate  Anturane Depen Capsules Marnal Soma  APF Arthritis pain formula Dewitt's Pills Measurin Stanback  Argesic Dia-Gesic Meclofenamic Sulfinpyrazone  Arthritis Bayer Timed Release Aspirin Diclofenac Meclomen Sulindac  Arthritis pain formula Anacin Dicumarol Medipren Supac  Analgesic (Safety coated) Arthralgen Diffunasal Mefanamic Suprofen  Arthritis Strength Bufferin Dihydrocodeine Mepro Compound Suprol  Arthropan liquid Dopirydamole  Methcarbomol with Aspirin Synalgos  ASA tablets/Enseals Disalcid Micrainin Tagament  Ascriptin Doan's Midol Talwin  Ascriptin A/D Dolene Mobidin Tanderil  Ascriptin Extra Strength Dolobid Moblgesic Ticlid  Ascriptin with Codeine Doloprin or Doloprin with Codeine Momentum Tolectin  Asperbuf Duoprin Mono-gesic Trendar  Aspergum Duradyne Motrin or Motrin IB Triminicin  Aspirin plain, buffered or enteric coated Durasal Myochrisine Trigesic  Aspirin Suppositories Easprin Nalfon Trillsate  Aspirin with Codeine Ecotrin Regular or Extra Strength Naprosyn Uracel  Atromid-S Efficin Naproxen Ursinus  Auranofin Capsules Elmiron Neocylate Vanquish  Axotal Emagrin Norgesic Verin  Azathioprine Empirin or Empirin with Codeine Normiflo Vitamin E  Azolid Emprazil Nuprin Voltaren  Bayer Aspirin plain, buffered or children's or timed BC Tablets or powders Encaprin Orgaran Warfarin Sodium  Buff-a-Comp Enoxaparin Orudis Zorpin  Buff-a-Comp with Codeine Equegesic Os-Cal-Gesic   Buffaprin Excedrin plain, buffered or Extra Strength Oxalid   Bufferin Arthritis Strength Feldene Oxphenbutazone   Bufferin plain or Extra Strength Feldene Capsules Oxycodone with Aspirin   Bufferin with Codeine Fenoprofen Fenoprofen Pabalate or Pabalate-SF   Buffets II Flogesic Panagesic   Buffinol plain or Extra Strength Florinal or Florinal with Codeine Panwarfarin   Buf-Tabs Flurbiprofen Penicillamine   Butalbital Compound Four-way cold tablets Penicillin   Butazolidin Fragmin Pepto-Bismol   Carbenicillin Geminisyn Percodan   Carna Arthritis Reliever Geopen Persantine   Carprofen Gold's salt Persistin   Chloramphenicol Goody's Phenylbutazone   Chloromycetin Haltrain Piroxlcam   Clmetidine heparin Plaquenil   Cllnoril Hyco-pap Ponstel   Clofibrate Hydroxy chloroquine Propoxyphen         Before stopping any of these medications, be sure to consult the physician who ordered them.  Some, such as Coumadin (Warfarin) are ordered  to prevent or treat serious conditions such as "deep thrombosis", "pumonary embolisms", and other heart problems.  The amount of time that you may need off of the medication may also vary with the medication and the reason for which you were taking it.  If you are taking any of these medications, please make sure you notify your pain physician before you undergo any procedures.         Knee Injection Joint injections are shots. Your caregiver will place a needle into your knee joint. The needle is used to put medicine into the joint. These shots can be used to help treat different painful knee conditions such as osteoarthritis, bursitis, local flare-ups of rheumatoid arthritis, and pseudogout. Anti-inflammatory medicines such as corticosteroids and anesthetics are the most common medicines used for joint and soft tissue  injections.  PROCEDURE 2. The skin over the kneecap will be cleaned with an antiseptic solution. 3. Your caregiver will inject a small amount of a local anesthetic (a medicine like Novocaine) just under the skin in the area that was cleaned. 4. After the area becomes numb, a second injection is done. This second injection usually includes an anesthetic and an anti-inflammatory medicine called a steroid or cortisone. The needle is carefully placed in between the kneecap and the knee, and the medicine is injected into the joint space. 5. After the injection is done, the needle is removed. Your caregiver may place a bandage over the injection site. The whole procedure takes no more than a couple of minutes. BEFORE THE PROCEDURE  Wash all of the skin around the entire knee area. Try to remove any loose, scaling skin. There is no other specific preparation necessary unless advised otherwise by your caregiver. LET YOUR CAREGIVER KNOW ABOUT:   Allergies.  Medications taken including herbs, eye drops, over the counter medications, and creams.  Use of steroids (by mouth or  creams).  Possible pregnancy, if applicable.  Previous problems with anesthetics or Novocaine.  History of blood clots (thrombophlebitis).  History of bleeding or blood problems.  Previous surgery.  Other health problems. RISKS AND COMPLICATIONS Side effects from cortisone shots are rare. They include:   Slight bruising of the skin.  Shrinkage of the normal fatty tissue under the skin where the shot was given.  Increase in pain after the shot.  Infection.  Weakening of tendons or tendon rupture.  Allergic reaction to the medicine.  Diabetics may have a temporary increase in their blood sugar after a shot.  Cortisone can temporarily weaken the immune system. While receiving these shots, you should not get certain vaccines. Also, avoid contact with anyone who has chickenpox or measles. Especially if you have never had these diseases or have not been previously immunized. Your immune system may not be strong enough to fight off the infection while the cortisone is in your system. AFTER THE PROCEDURE   You can go home after the procedure.  You may need to put ice on the joint 15-20 minutes every 3 or 4 hours until the pain goes away.  You may need to put an elastic bandage on the joint. HOME CARE INSTRUCTIONS  12. Only take over-the-counter or prescription medicines for pain, discomfort, or fever as directed by your caregiver. 13. You should avoid stressing the joint. Unless advised otherwise, avoid activities that put a lot of pressure on a knee joint, such as: 1. Jogging. 2. Bicycling. 3. Recreational climbing. 4. Hiking. 14. Laying down and elevating the leg/knee above the level of your heart can help to minimize swelling. SEEK MEDICAL CARE IF:   You have repeated or worsening swelling.  There is drainage from the puncture area.  You develop red streaking that extends above or below the site where the needle was inserted. SEEK IMMEDIATE MEDICAL CARE IF:   You  develop a fever.  You have pain that gets worse even though you are taking pain medicine.  The area is red and warm, and you have trouble moving the joint. MAKE SURE YOU:   Understand these instructions.  Will watch your condition.  Will get help right away if you are not doing well or get worse. Document Released: 04/04/2006 Document Revised: 04/05/2011 Document Reviewed: 12/30/2006 [facility] Patient Information 2015 [place], [place]. This information is not intended to replace advice given to you by your  health care provider. Make sure you discuss any questions you have with your health care provider.

## 2014-06-26 DEATH — deceased

## 2014-06-27 ENCOUNTER — Other Ambulatory Visit: Payer: Self-pay | Admitting: Pain Medicine

## 2014-06-27 ENCOUNTER — Encounter: Payer: Self-pay | Admitting: Family Medicine

## 2014-06-27 ENCOUNTER — Telehealth: Payer: Self-pay | Admitting: Pain Medicine

## 2014-06-27 DIAGNOSIS — M47816 Spondylosis without myelopathy or radiculopathy, lumbar region: Secondary | ICD-10-CM

## 2014-06-27 DIAGNOSIS — M5136 Other intervertebral disc degeneration, lumbar region: Secondary | ICD-10-CM

## 2014-06-27 DIAGNOSIS — M160 Bilateral primary osteoarthritis of hip: Secondary | ICD-10-CM

## 2014-06-27 DIAGNOSIS — M533 Sacrococcygeal disorders, not elsewhere classified: Secondary | ICD-10-CM

## 2014-06-27 DIAGNOSIS — M7062 Trochanteric bursitis, left hip: Secondary | ICD-10-CM

## 2014-06-27 DIAGNOSIS — M7061 Trochanteric bursitis, right hip: Secondary | ICD-10-CM

## 2014-06-27 DIAGNOSIS — M51369 Other intervertebral disc degeneration, lumbar region without mention of lumbar back pain or lower extremity pain: Secondary | ICD-10-CM

## 2014-06-27 NOTE — Telephone Encounter (Signed)
[facility] is calling because the medication that was recently sent has has the wrong capsul amount. It's supposed to be for 3 capsules, not 2.

## 2014-06-28 MED ORDER — VENLAFAXINE HCL ER 75 MG PO CP24: ORAL_CAPSULE | ORAL | Status: DC

## 2014-06-28 NOTE — Telephone Encounter (Signed)
I spoke to [facility] and gave correct Rx on the phone. It looks like the incorrect sig/# was sent as request and was approved without correcting. I answered pt's email and notified her that correction has been made and apologized that the error wasn't caught when authorized.

## 2014-07-08 ENCOUNTER — Ambulatory Visit: Payer: Self-pay | Admitting: Family Medicine

## 2014-07-17 ENCOUNTER — Other Ambulatory Visit: Payer: Self-pay | Admitting: Pain Medicine

## 2014-07-17 DIAGNOSIS — M5136 Other intervertebral disc degeneration, lumbar region: Secondary | ICD-10-CM

## 2014-07-17 DIAGNOSIS — M533 Sacrococcygeal disorders, not elsewhere classified: Secondary | ICD-10-CM

## 2014-07-17 DIAGNOSIS — M160 Bilateral primary osteoarthritis of hip: Secondary | ICD-10-CM

## 2014-07-17 DIAGNOSIS — M170 Bilateral primary osteoarthritis of knee: Secondary | ICD-10-CM

## 2014-07-19 ENCOUNTER — Ambulatory Visit (INDEPENDENT_AMBULATORY_CARE_PROVIDER_SITE_OTHER): Payer: No Typology Code available for payment source | Admitting: Family Medicine

## 2014-07-19 ENCOUNTER — Encounter: Payer: Self-pay | Admitting: Family Medicine

## 2014-07-19 VITALS — BP 148/82 | HR 99 | Temp 98.6°F | Resp 16 | Ht 66.5 in | Wt 237.4 lb

## 2014-07-19 DIAGNOSIS — F418 Other specified anxiety disorders: Secondary | ICD-10-CM | POA: Diagnosis not present

## 2014-07-19 DIAGNOSIS — M47817 Spondylosis without myelopathy or radiculopathy, lumbosacral region: Secondary | ICD-10-CM | POA: Diagnosis not present

## 2014-07-19 DIAGNOSIS — G43009 Migraine without aura, not intractable, without status migrainosus: Secondary | ICD-10-CM | POA: Diagnosis not present

## 2014-07-19 DIAGNOSIS — R7302 Impaired glucose tolerance (oral): Secondary | ICD-10-CM

## 2014-07-19 DIAGNOSIS — M170 Bilateral primary osteoarthritis of knee: Secondary | ICD-10-CM

## 2014-07-19 DIAGNOSIS — J0101 Acute recurrent maxillary sinusitis: Secondary | ICD-10-CM | POA: Diagnosis not present

## 2014-07-19 DIAGNOSIS — J301 Allergic rhinitis due to pollen: Secondary | ICD-10-CM | POA: Diagnosis not present

## 2014-07-19 DIAGNOSIS — I10 Essential (primary) hypertension: Secondary | ICD-10-CM

## 2014-07-19 DIAGNOSIS — E7800 Pure hypercholesterolemia, unspecified: Secondary | ICD-10-CM

## 2014-07-19 DIAGNOSIS — M47816 Spondylosis without myelopathy or radiculopathy, lumbar region: Secondary | ICD-10-CM

## 2014-07-19 DIAGNOSIS — M545 Low back pain: Secondary | ICD-10-CM

## 2014-07-19 DIAGNOSIS — M47818 Spondylosis without myelopathy or radiculopathy, sacral and sacrococcygeal region: Secondary | ICD-10-CM

## 2014-07-19 DIAGNOSIS — M461 Sacroiliitis, not elsewhere classified: Secondary | ICD-10-CM

## 2014-07-19 LAB — CBC WITH DIFFERENTIAL/PLATELET
BASOS PCT: 0 % (ref 0–1)
Basophils Absolute: 0 10*3/uL (ref 0.0–0.1)
EOS ABS: 0.2 10*3/uL (ref 0.0–0.7)
EOS PCT: 3 % (ref 0–5)
HEMATOCRIT: 40.6 % (ref 36.0–46.0)
Hemoglobin: 13.4 g/dL (ref 12.0–15.0)
Lymphocytes Relative: 36 % (ref 12–46)
Lymphs Abs: 2.3 10*3/uL (ref 0.7–4.0)
MCH: 29.5 pg (ref 26.0–34.0)
MCHC: 33 g/dL (ref 30.0–36.0)
MCV: 89.4 fL (ref 78.0–100.0)
MONO ABS: 0.4 10*3/uL (ref 0.1–1.0)
MONOS PCT: 7 % (ref 3–12)
MPV: 8.2 fL — ABNORMAL LOW (ref 8.6–12.4)
Neutro Abs: 3.5 10*3/uL (ref 1.7–7.7)
Neutrophils Relative %: 54 % (ref 43–77)
Platelets: 306 10*3/uL (ref 150–400)
RBC: 4.54 MIL/uL (ref 3.87–5.11)
RDW: 13.3 % (ref 11.5–15.5)
WBC: 6.4 10*3/uL (ref 4.0–10.5)

## 2014-07-19 LAB — HEMOGLOBIN A1C
HEMOGLOBIN A1C: 5.8 % — AB (ref <5.7)
Mean Plasma Glucose: 120 mg/dL — ABNORMAL HIGH (ref <117)

## 2014-07-19 MED ORDER — MONTELUKAST SODIUM 10 MG PO TABS: 10.0000 mg | ORAL_TABLET | Freq: Every day | ORAL | Status: DC

## 2014-07-19 MED ORDER — CLARITHROMYCIN 500 MG PO TABS: 500.0000 mg | ORAL_TABLET | Freq: Two times a day (BID) | ORAL | Status: DC

## 2014-07-19 NOTE — Progress Notes (Signed)
Subjective:    Patient ID: Bethany Scott, female    DOB: 1955-10-04, 59 y.o.   MRN: [id number]  07/19/2014  Diabetes; Hyperlipidemia; Hypertension; and Anxiety   HPI This 59 y.o. female presents for three month follow-up:    1. Anxiety and depression: Patient reports good compliance with medication, good tolerance to medication, and good symptom control.  Son and 2 grandchildren moved to [place] two months.  Misses Addy too much; August was 4 months old when they moved.  Sends granddaughter gifts in the mail all the time.  Skypes once per week.  Going to [place] for one week; going in one week.  Crying excessively.  Effexor 256m (3  764mdaily).  Xanax not every night; taking 3-4 nights per week.  Going to work; enjoying work; not missing work.  Tears up at work.  Mad at son and how he handled it; 1.5 weeks later they were gone.    [place] Qu[place] help move her; BrErlene Quanas been skiing several times in the past.  He told pt that he had a job before he moved but he did not have a job. Now BrErlene Quans staying home with the kids.  BrErlene Quanas done some really ugly things; around Christmas BrErlene Quanid not want [place] BrErlene Quanas worried about NaDarci CurrentBrErlene Quans bipolar and they had been adjusting medications.  BrErlene Quans living with stedaughter in apartment.    2.  Arthralgias: [facility] stopped Sulfasalazine; does not feel autoimmune process; thinks all OA.  S/p nerve injection B knees with significant help.  Dr. KeJefm Bryants recommending nerve injections into B hips via groin approach.  Brother with OA and DDD lumbar spine.  A month ago stopped drinking diet Pepsi.  Got sick and kept throwing up diet pepsi.   Dr. CrPrimus Bravoaving patient taking: 1 hydrocodone and 1 tramadol up to three per day.  Seeing him every month; appointment next week for injections on hips.    3.  DMII/glucose intolerance: drinking some  sweet tea.  Weight is up seven pounds in past month.  Not exercising.    4.  HTN:  Patient reports good compliance with medication, good tolerance to medication, and good symptom control.    5.  Hyperlipidemia: elevated at last visit.  6.  Sinus congestion:  Onset one week ago.  Hoarseness; no fever/chills/sweats.  +HA; no ear pain; no sore throat; +rhinorrhrea; +nasal congestion; no cough.  No SOB or wheezing.  +PND. +hoarseness.    Review of Systems  Constitutional: Negative for fever, chills, diaphoresis and fatigue.  Eyes: Negative for visual disturbance.  Respiratory: Negative for cough and shortness of breath.   Cardiovascular: Negative for chest pain, palpitations and leg swelling.  Gastrointestinal: Negative for nausea, vomiting, abdominal pain, diarrhea and constipation.  Endocrine: Negative for cold intolerance, heat intolerance, polydipsia, polyphagia and polyuria.  Neurological: Negative for dizziness, tremors, seizures, syncope, facial asymmetry, speech difficulty, weakness, light-headedness, numbness and headaches.    Past Medical History  Diagnosis Date  . Allergy   . Anxiety   . Asthma   . Depression   . Diabetes mellitus without complication   . GERD (gastroesophageal reflux disease)   . Hyperlipidemia   . Migraine   . Gastric ulcer, unspecified as acute or chronic, without mention of hemorrhage, perforation, or obstruction   . Personal history of colonic polyps   . Other chronic cystitis   .  Obesity, unspecified   . Hypertension   . Arthritis     OA Knee; non-specific autoimmune process followed by [facility]/Rheumatology.   Past Surgical History  Procedure Laterality Date  . Cesarean section      x 2  . Tonsillectomy and adenoidectomy    . Sigmoid resection / rectopexy    . Bilateral oophorectomy  2001  . Neck surgery    . Nasal septum surgery    . Spine surgery    . Appendectomy    . Colon surgery    . Rheumatology consult  11/26/2011     multiple arthralgias. Autoimmune labs negative.  Rx for Plaquenil for non-specific autoimmune process.  [name].  . Esophagogastroduodenoscopy  09/08/2012    +HH.  [name].  Symptoms: anemia, hemoccult +.  . Colonoscopy  09/08/2012    diverticulosis, hemorrhoids.  [name].  Symptoms:  anemia, hemoccult +.  . Abdominal hysterectomy  01/25/1994    uterine fibroids; DUB; cervical dysplasia; ovaries resected two years later.     Allergies  Allergen Reactions  . Cephalosporins Anaphylaxis  . Penicillins Anaphylaxis   Current Outpatient Prescriptions  Medication Sig Dispense Refill  . ALPRAZolam (XANAX) 0.5 MG tablet TAKE 1 TABLET EVERY DAY AS NEEDED 30 tablet 5  . azelastine (ASTELIN) 0.1 % nasal spray     . Blood Glucose Monitoring Suppl KIT 1 each by Does not apply route as needed. Please dispense the monitor and supplies compliant with pt insurance ONETOUCH 100 each 11  . cetirizine (ZYRTEC) 10 MG tablet Take 10 mg by mouth daily.    . cholecalciferol (VITAMIN D) 1000 UNITS tablet Take 1,000 Units by mouth daily.    . fluticasone (FLONASE) 50 MCG/ACT nasal spray     . Fluticasone-Salmeterol (ADVAIR DISKUS) 100-50 MCG/DOSE AEPB Inhale 1 puff into the lungs every 12 (twelve) hours.    [name] lisinopril (PRINIVIL,ZESTRIL) 5 MG tablet TAKE 1 TABLET (5 MG TOTAL) BY MOUTH DAILY. 30 tablet 5  . lisinopril (PRINIVIL,ZESTRIL) 5 MG tablet TAKE 1 TABLET BY MOUTH EVERY DAY (Patient not taking: Reported on 07/24/2014) 30 tablet 2  . montelukast (SINGULAIR) 10 MG tablet Take 1 tablet (10 mg total) by mouth daily. 30 tablet 11  . Multiple Vitamin (MULTIVITAMIN) capsule Take 1 capsule by mouth daily.    . nitrofurantoin, macrocrystal-monohydrate, (MACROBID) 100 MG capsule TAKE 1 CAPSULE (100 MG TOTAL) BY MOUTH 2 (TWO) TIMES DAILY AS NEEDED. 30 capsule 2  . pantoprazole (PROTONIX) 40 MG tablet TAKE 1 TABLET BY MOUTH TWICE A DAY 60 tablet 11  . phenazopyridine (PYRIDIUM) 100 MG tablet Take 1 tablet (100 mg  total) by mouth 3 (three) times daily as needed for pain. 30 tablet 2  . promethazine (PHENERGAN) 25 MG tablet TAKE 1 TABLET BY MOUTH EVERY 6 HOURS AS NEEDED FOR NAUSEA AND VOMITING 40 tablet 3  . rizatriptan (MAXALT) 10 MG tablet Take 10 mg by mouth as needed. May repeat in 2 hours if needed    . venlafaxine XR (EFFEXOR-XR) 75 MG 24 hr capsule TAKE 3 CAPSULES (225 MG TOTAL) BY MOUTH DAILY WITH BREAKFAST. 90 capsule 0  . ciprofloxacin (CIPRO) 250 MG tablet Take 1 tablet (250 mg total) by mouth 2 (two) times daily. 14 tablet 0  . clarithromycin (BIAXIN) 500 MG tablet Take 1 tablet (500 mg total) by mouth 2 (two) times daily. (Patient not taking: Reported on 07/24/2014) 20 tablet 0  . diclofenac (VOLTAREN) 50 MG EC tablet Take 50 mg by mouth 2 (two)  times daily.    [name] HYDROcodone-acetaminophen (NORCO) 7.5-325 MG per tablet Limit 1 tab by mouth per day or 2-3 times per day if tolerated 90 tablet 0  . meloxicam (MOBIC) 15 MG tablet     . sulfaSALAzine (AZULFIDINE) 500 MG tablet Take 500 mg by mouth 4 (four) times daily.    . traMADol (ULTRAM) 50 MG tablet Limit1 tab by mouth 2-4 times per day if tolerated 120 tablet 0   No current facility-administered medications for this visit.       Objective:    BP 148/82 mmHg  Pulse 99  Temp(Src) 98.6 F (37 C) (Oral)  Resp 16  Ht 5' 6.5" (1.689 m)  Wt 237 lb 6.4 oz (107.684 kg)  BMI 37.75 kg/m2  SpO2 97% Physical Exam  Constitutional: She is oriented to person, place, and time. She appears well-developed and well-nourished. No distress.  HENT:  Head: Normocephalic and atraumatic.  Right Ear: External ear normal.  Left Ear: External ear normal.  Nose: Nose normal.  Mouth/Throat: Oropharynx is clear and moist.  Eyes: Conjunctivae and EOM are normal. Pupils are equal, round, and reactive to light.  Neck: Normal range of motion. Neck supple. Carotid bruit is not present. No thyromegaly present.  Cardiovascular: Normal rate, regular rhythm, normal  heart sounds and intact distal pulses.  Exam reveals no gallop and no friction rub.   No murmur heard. Pulmonary/Chest: Effort normal and breath sounds normal. She has no wheezes. She has no rales.  Abdominal: Soft. Bowel sounds are normal. She exhibits no distension and no mass. There is no tenderness. There is no rebound and no guarding.  Lymphadenopathy:    She has no cervical adenopathy.  Neurological: She is alert and oriented to person, place, and time. No cranial nerve deficit.  Skin: Skin is warm and dry. No rash noted. She is not diaphoretic. No erythema. No pallor.  Psychiatric: She has a normal mood and affect. Her behavior is normal.   Results for orders placed or performed in visit on 07/19/14  CBC with Differential/Platelet  Result Value Ref Range   WBC 6.4 4.0 - 10.5 K/uL   RBC 4.54 3.87 - 5.11 MIL/uL   Hemoglobin 13.4 12.0 - 15.0 g/dL   HCT 40.6 36.0 - 46.0 %   MCV 89.4 78.0 - 100.0 fL   MCH 29.5 26.0 - 34.0 pg   MCHC 33.0 30.0 - 36.0 g/dL   RDW 13.3 11.5 - 15.5 %   Platelets 306 150 - 400 K/uL   MPV 8.2 (L) 8.6 - 12.4 fL   Neutrophils Relative % 54 43 - 77 %   Neutro Abs 3.5 1.7 - 7.7 K/uL   Lymphocytes Relative 36 12 - 46 %   Lymphs Abs 2.3 0.7 - 4.0 K/uL   Monocytes Relative 7 3 - 12 %   Monocytes Absolute 0.4 0.1 - 1.0 K/uL   Eosinophils Relative 3 0 - 5 %   Eosinophils Absolute 0.2 0.0 - 0.7 K/uL   Basophils Relative 0 0 - 1 %   Basophils Absolute 0.0 0.0 - 0.1 K/uL   Smear Review Criteria for review not met   Comprehensive metabolic panel  Result Value Ref Range   Sodium 141 135 - 145 mEq/L   Potassium 3.8 3.5 - 5.3 mEq/L   Chloride 101 96 - 112 mEq/L   CO2 27 19 - 32 mEq/L   Glucose, Bld 160 (H) 70 - 99 mg/dL   BUN 9 6 - 23 mg/dL  Creat 0.56 0.50 - 1.10 mg/dL   Total Bilirubin 0.3 0.2 - 1.2 mg/dL   Alkaline Phosphatase 82 39 - 117 U/L   AST 13 0 - 37 U/L   ALT 14 0 - 35 U/L   Total Protein 7.0 6.0 - 8.3 g/dL   Albumin 4.1 3.5 - 5.2 g/dL    Calcium 9.5 8.4 - 10.5 mg/dL  Hemoglobin A1c  Result Value Ref Range   Hgb A1c MFr Bld 5.8 (H) <5.7 %   Mean Plasma Glucose 120 (H) <117 mg/dL  Lipid panel  Result Value Ref Range   Cholesterol 255 (H) 0 - 200 mg/dL   Triglycerides 98 <150 mg/dL   HDL 46 >=46 mg/dL   Total CHOL/HDL Ratio 5.5 Ratio   VLDL 20 0 - 40 mg/dL   LDL Cholesterol 189 (H) 0 - 99 mg/dL       Assessment & Plan:   1. Pure hypercholesterolemia   2. Migraine without aura and without status migrainosus, not intractable   3. Facet syndrome, lumbar   4. Essential hypertension, benign   5. Primary osteoarthritis of both knees   6. Depression with anxiety   7. Degenerative joint disease of sacroiliac joint   8. Glucose intolerance (impaired glucose tolerance)   9. Allergic rhinitis due to pollen   10. Acute recurrent maxillary sinusitis     Meds ordered this encounter  Medications  . montelukast (SINGULAIR) 10 MG tablet    Sig: Take 1 tablet (10 mg total) by mouth daily.    Dispense:  30 tablet    Refill:  11  . clarithromycin (BIAXIN) 500 MG tablet    Sig: Take 1 tablet (500 mg total) by mouth 2 (two) times daily.    Dispense:  20 tablet    Refill:  0    Return in about 4 months (around 11/18/2014) for recheck.    Dawit Tankard Elayne Guerin, M.D. [facility] [address] [place]  [address] [phone] phone [phone] fax

## 2014-07-20 LAB — COMPREHENSIVE METABOLIC PANEL
ALT: 14 U/L (ref 0–35)
AST: 13 U/L (ref 0–37)
Albumin: 4.1 g/dL (ref 3.5–5.2)
Alkaline Phosphatase: 82 U/L (ref 39–117)
BILIRUBIN TOTAL: 0.3 mg/dL (ref 0.2–1.2)
BUN: 9 mg/dL (ref 6–23)
CALCIUM: 9.5 mg/dL (ref 8.4–10.5)
CO2: 27 mEq/L (ref 19–32)
Chloride: 101 mEq/L (ref 96–112)
Creat: 0.56 mg/dL (ref 0.50–1.10)
GLUCOSE: 160 mg/dL — AB (ref 70–99)
Potassium: 3.8 mEq/L (ref 3.5–5.3)
Sodium: 141 mEq/L (ref 135–145)
Total Protein: 7 g/dL (ref 6.0–8.3)

## 2014-07-20 LAB — LIPID PANEL
CHOLESTEROL: 255 mg/dL — AB (ref 0–200)
HDL: 46 mg/dL (ref >=46)
LDL Cholesterol: 189 mg/dL — ABNORMAL HIGH (ref 0–99)
Total CHOL/HDL Ratio: 5.5 Ratio
Triglycerides: 98 mg/dL (ref <150)
VLDL: 20 mg/dL (ref 0–40)

## 2014-07-24 ENCOUNTER — Encounter: Payer: Self-pay | Admitting: Pain Medicine

## 2014-07-24 ENCOUNTER — Ambulatory Visit: Payer: No Typology Code available for payment source | Attending: Pain Medicine | Admitting: Pain Medicine

## 2014-07-24 VITALS — BP 131/59 | HR 89 | Temp 97.1°F | Resp 18 | Ht 67.0 in | Wt 234.0 lb

## 2014-07-24 DIAGNOSIS — M7061 Trochanteric bursitis, right hip: Secondary | ICD-10-CM

## 2014-07-24 DIAGNOSIS — M169 Osteoarthritis of hip, unspecified: Secondary | ICD-10-CM | POA: Insufficient documentation

## 2014-07-24 DIAGNOSIS — M47818 Spondylosis without myelopathy or radiculopathy, sacral and sacrococcygeal region: Secondary | ICD-10-CM

## 2014-07-24 DIAGNOSIS — M25551 Pain in right hip: Secondary | ICD-10-CM | POA: Insufficient documentation

## 2014-07-24 DIAGNOSIS — M79604 Pain in right leg: Secondary | ICD-10-CM | POA: Diagnosis present

## 2014-07-24 DIAGNOSIS — M7062 Trochanteric bursitis, left hip: Secondary | ICD-10-CM

## 2014-07-24 DIAGNOSIS — M461 Sacroiliitis, not elsewhere classified: Secondary | ICD-10-CM

## 2014-07-24 DIAGNOSIS — M5136 Other intervertebral disc degeneration, lumbar region: Secondary | ICD-10-CM

## 2014-07-24 DIAGNOSIS — M545 Low back pain: Secondary | ICD-10-CM | POA: Insufficient documentation

## 2014-07-24 DIAGNOSIS — M170 Bilateral primary osteoarthritis of knee: Secondary | ICD-10-CM

## 2014-07-24 DIAGNOSIS — M160 Bilateral primary osteoarthritis of hip: Secondary | ICD-10-CM

## 2014-07-24 DIAGNOSIS — M79605 Pain in left leg: Secondary | ICD-10-CM | POA: Diagnosis present

## 2014-07-24 MED ORDER — TRAMADOL HCL 50 MG PO TABS: ORAL_TABLET | ORAL | Status: DC

## 2014-07-24 MED ORDER — METHYLPREDNISOLONE ACETATE 40 MG/ML IJ SUSP
INTRAMUSCULAR | Status: AC
  Administered 2014-07-24: 09:00:00
  Filled 2014-07-24: qty 1

## 2014-07-24 MED ORDER — HYDROCODONE-ACETAMINOPHEN 7.5-325 MG PO TABS: ORAL_TABLET | ORAL | Status: DC

## 2014-07-24 MED ORDER — CIPROFLOXACIN HCL 250 MG PO TABS: 250.0000 mg | ORAL_TABLET | Freq: Two times a day (BID) | ORAL | Status: DC

## 2014-07-24 NOTE — Patient Instructions (Addendum)
Continue present medications and antibiotics. Please obtain your antibiotic, Cipro, today and begin taking antibiotic today  F/U PCP for evaliation of  BP and general medical  condition.  F/U surgical evaluation as discussed  F/U neurological evaluation.  May consider radiofrequency rhizolysis or intraspinal procedures pending response to present treatment and F/U evaluation.  Patient to call [facility] should patient have concerns prior to scheduled return appointment.  Pain Management Discharge Instructions  General Discharge Instructions :  If you need to reach your doctor call: Monday-Friday 8:00 am - 4:00 pm at [phone] or toll free [phone].  After clinic hours [phone] to have operator reach doctor.  Bring all of your medication bottles to all your appointments in the pain clinic.  To cancel or reschedule your appointment with Pain Management please remember to call 24 hours in advance to avoid a fee.  Refer to the educational materials which you have been given on: General Risks, I had my Procedure. Discharge Instructions, Post Sedation.  Post Procedure Instructions:  The drugs you were given will stay in your system until tomorrow, so for the next 24 hours you should not drive, make any legal decisions or drink any alcoholic beverages.  You may eat anything you prefer, but it is better to start with liquids then soups and crackers, and gradually work up to solid foods.  Please notify your doctor immediately if you have any unusual bleeding, trouble breathing or pain that is not related to your normal pain.  Depending on the type of procedure that was done, some parts of your body may feel week and/or numb.  This usually clears up by tonight or the next day.  Walk with the use of an assistive device or accompanied by an adult for the 24 hours.  You may use ice on the affected area for the first 24 hours.  Put ice in a Ziploc bag and cover with a  towel and place against area 15 minutes on 15 minutes off.  You may switch to heat after 24 hours.GENERAL RISKS AND COMPLICATIONS  What are the risk, side effects and possible complications? Generally speaking, most procedures are safe.  However, with any procedure there are risks, side effects, and the possibility of complications.  The risks and complications are dependent upon the sites that are lesioned, or the type of nerve block to be performed.  The closer the procedure is to the spine, the more serious the risks are.  Great care is taken when placing the radio frequency needles, block needles or lesioning probes, but sometimes complications can occur. 1. Infection: Any time there is an injection through the skin, there is a risk of infection.  This is why sterile conditions are used for these blocks.  There are four possible types of infection. 1. Localized skin infection. 2. Central Nervous System Infection-This can be in the form of Meningitis, which can be deadly. 3. Epidural Infections-This can be in the form of an epidural abscess, which can cause pressure inside of the spine, causing compression of the spinal cord with subsequent paralysis. This would require an emergency surgery to decompress, and there are no guarantees that the patient would recover from the paralysis. 4. Discitis-This is an infection of the intervertebral discs.  It occurs in about 1% of discography procedures.  It is difficult to treat and it may lead to surgery.        2. Pain: the needles have to go through skin and soft tissues, will  cause soreness.       3. Damage to internal structures:  The nerves to be lesioned may be near blood vessels or    other nerves which can be potentially damaged.       4. Bleeding: Bleeding is more common if the patient is taking blood thinners such as  aspirin, Coumadin, Ticiid, Plavix, etc., or if he/she have some genetic predisposition  such as hemophilia. Bleeding into the spinal  canal can cause compression of the spinal  cord with subsequent paralysis.  This would require an emergency surgery to  decompress and there are no guarantees that the patient would recover from the  paralysis.       5. Pneumothorax:  Puncturing of a lung is a possibility, every time a needle is introduced in  the area of the chest or upper back.  Pneumothorax refers to free air around the  collapsed lung(s), inside of the thoracic cavity (chest cavity).  Another two possible  complications related to a similar event would include: Hemothorax and Chylothorax.   These are variations of the Pneumothorax, where instead of air around the collapsed  lung(s), you may have blood or chyle, respectively.       6. Spinal headaches: They may occur with any procedures in the area of the spine.       7. Persistent CSF (Cerebro-Spinal Fluid) leakage: This is a rare problem, but may occur  with prolonged intrathecal or epidural catheters either due to the formation of a fistulous  track or a dural tear.       8. Nerve damage: By working so close to the spinal cord, there is always a possibility of  nerve damage, which could be as serious as a permanent spinal cord injury with  paralysis.       9. Death:  Although rare, severe deadly allergic reactions known as "Anaphylactic  reaction" can occur to any of the medications used.      10. Worsening of the symptoms:  We can always make thing worse.  What are the chances of something like this happening? Chances of any of this occuring are extremely low.  By statistics, you have more of a chance of getting killed in a motor vehicle accident: while driving to the hospital than any of the above occurring .  Nevertheless, you should be aware that they are possibilities.  In general, it is similar to taking a shower.  Everybody knows that you can slip, hit your head and get killed.  Does that mean that you should not shower again?  Nevertheless always keep in mind that statistics  do not mean anything if you happen to be on the wrong side of them.  Even if a procedure has a 1 (one) in a 1,000,000 (million) chance of going wrong, it you happen to be that one..Also, keep in mind that by statistics, you have more of a chance of having something go wrong when taking medications.  Who should not have this procedure? If you are on a blood thinning medication (e.g. Coumadin, Plavix, see list of "Blood Thinners"), or if you have an active infection going on, you should not have the procedure.  If you are taking any blood thinners, please inform your physician.  How should I prepare for this procedure?  Do not eat or drink anything at least six hours prior to the procedure.  Bring a driver with you .  It cannot be a taxi.  Come accompanied by  an adult that can drive you back, and that is strong enough to help you if your legs get weak or numb from the local anesthetic.  Take all of your medicines the morning of the procedure with just enough water to swallow them.  If you have diabetes, make sure that you are scheduled to have your procedure done first thing in the morning, whenever possible.  If you have diabetes, take only half of your insulin dose and notify our nurse that you have done so as soon as you arrive at the clinic.  If you are diabetic, but only take blood sugar pills (oral hypoglycemic), then do not take them on the morning of your procedure.  You may take them after you have had the procedure.  Do not take aspirin or any aspirin-containing medications, at least eleven (11) days prior to the procedure.  They may prolong bleeding.  Wear loose fitting clothing that may be easy to take off and that you would not mind if it got stained with Betadine or blood.  Do not wear any jewelry or perfume  Remove any nail coloring.  It will interfere with some of our monitoring equipment.  NOTE: Remember that this is not meant to be interpreted as a complete list of all  possible complications.  Unforeseen problems may occur.  BLOOD THINNERS The following drugs contain aspirin or other products, which can cause increased bleeding during surgery and should not be taken for 2 weeks prior to and 1 week after surgery.  If you should need take something for relief of minor pain, you may take acetaminophen which is found in Tylenol,m Datril, Anacin-3 and Panadol. It is not blood thinner. The products listed below are.  Do not take any of the products listed below in addition to any listed on your instruction sheet.  A.P.C or A.P.C with Codeine Codeine Phosphate Capsules #3 Ibuprofen Ridaura  ABC compound Congesprin Imuran rimadil  Advil Cope Indocin Robaxisal  Alka-Seltzer Effervescent Pain Reliever and Antacid Coricidin or Coricidin-D  Indomethacin Rufen  Alka-Seltzer plus Cold Medicine Cosprin Ketoprofen S-A-C Tablets  Anacin Analgesic Tablets or Capsules Coumadin Korlgesic Salflex  Anacin Extra Strength Analgesic tablets or capsules CP-2 Tablets Lanoril Salicylate  Anaprox Cuprimine Capsules Levenox Salocol  Anexsia-D Dalteparin Magan Salsalate  Anodynos Darvon compound Magnesium Salicylate Sine-off  Ansaid Dasin Capsules Magsal Sodium Salicylate  Anturane Depen Capsules Marnal Soma  APF Arthritis pain formula Dewitt's Pills Measurin Stanback  Argesic Dia-Gesic Meclofenamic Sulfinpyrazone  Arthritis Bayer Timed Release Aspirin Diclofenac Meclomen Sulindac  Arthritis pain formula Anacin Dicumarol Medipren Supac  Analgesic (Safety coated) Arthralgen Diffunasal Mefanamic Suprofen  Arthritis Strength Bufferin Dihydrocodeine Mepro Compound Suprol  Arthropan liquid Dopirydamole Methcarbomol with Aspirin Synalgos  ASA tablets/Enseals Disalcid Micrainin Tagament  Ascriptin Doan's Midol Talwin  Ascriptin A/D Dolene Mobidin Tanderil  Ascriptin Extra Strength Dolobid Moblgesic Ticlid  Ascriptin with Codeine Doloprin or Doloprin with Codeine Momentum Tolectin   Asperbuf Duoprin Mono-gesic Trendar  Aspergum Duradyne Motrin or Motrin IB Triminicin  Aspirin plain, buffered or enteric coated Durasal Myochrisine Trigesic  Aspirin Suppositories Easprin Nalfon Trillsate  Aspirin with Codeine Ecotrin Regular or Extra Strength Naprosyn Uracel  Atromid-S Efficin Naproxen Ursinus  Auranofin Capsules Elmiron Neocylate Vanquish  Axotal Emagrin Norgesic Verin  Azathioprine Empirin or Empirin with Codeine Normiflo Vitamin E  Azolid Emprazil Nuprin Voltaren  Bayer Aspirin plain, buffered or children's or timed BC Tablets or powders Encaprin Orgaran Warfarin Sodium  Buff-a-Comp Enoxaparin Orudis Zorpin  Buff-a-Comp with Codeine  Equegesic Os-Cal-Gesic   Buffaprin Excedrin plain, buffered or Extra Strength Oxalid   Bufferin Arthritis Strength Feldene Oxphenbutazone   Bufferin plain or Extra Strength Feldene Capsules Oxycodone with Aspirin   Bufferin with Codeine Fenoprofen Fenoprofen Pabalate or Pabalate-SF   Buffets II Flogesic Panagesic   Buffinol plain or Extra Strength Florinal or Florinal with Codeine Panwarfarin   Buf-Tabs Flurbiprofen Penicillamine   Butalbital Compound Four-way cold tablets Penicillin   Butazolidin Fragmin Pepto-Bismol   Carbenicillin Geminisyn Percodan   Carna Arthritis Reliever Geopen Persantine   Carprofen Gold's salt Persistin   Chloramphenicol Goody's Phenylbutazone   Chloromycetin Haltrain Piroxlcam   Clmetidine heparin Plaquenil   Cllnoril Hyco-pap Ponstel   Clofibrate Hydroxy chloroquine Propoxyphen         Before stopping any of these medications, be sure to consult the physician who ordered them.  Some, such as Coumadin (Warfarin) are ordered to prevent or treat serious conditions such as "deep thrombosis", "pumonary embolisms", and other heart problems.  The amount of time that you may need off of the medication may also vary with the medication and the reason for which you were taking it.  If you are taking any of these  medications, please make sure you notify your pain physician before you undergo any procedures.   Tramadol prescription given Hydrocodone script given and pick up cipro at pharmacy

## 2014-07-24 NOTE — Progress Notes (Signed)
Safety precautions to be maintained throughout the outpatient stay will include: orient to surroundings, keep bed in low position, maintain call bell within reach at all times, provide assistance with transfer out of bed and ambulation.  

## 2014-07-24 NOTE — Progress Notes (Signed)
   Subjective:    Patient ID: Bethany Scott, female    DOB: [birth year], 59 y.o.   MRN: [id number]  HPI  RIGHT HIP INJECTION  Patient is 59 year old female returns to [facility] for further evaluation and treatment of pain involving the lower back and lower extremity region. Patient is with severe pain involving the region of the right hip [facility] to [facility] interventional treatment consisting of right hip injection. Patient's pain has been felt to be due to degenerative joint disease of the right hip. We will proceed with right hip injection in attempt to decrease severity of patient's symptoms, retard progression of patient's symptoms, and hopefully avoid the need for more involved treatment. The risks benefits and expectations of the procedure have been discussed with patient and explained to patient who is understanding and agrees to proceed with right hip injection at this time.   Description of procedure:  Patient was taken to fluoroscopy suite.. The  patient was taken to the fluoroscopy suite and patient was positioned in supine position with EKG, blood pressure, pulse, and pulse oximetry monitoring all in place.. Betadine prep of proposed entry site was accomplished and local anesthetic skin wheal of 1-1/2% lidocaine to the proposed needle entry site was accomplished. 22-gauge needle was inserted under fluoroscopic guidance at the junction of the head of the femur and neck of the femur. A total of 2 cc of preservative-free normal saline with 40 mg of Depo-Medrol was injected for left hip injection. The needle was removed. Patient tolerated procedure well     Plan   Continue present medications tramadol and hydrocodone acetaminophen and antibiotic.   F/U PCP for evaliation of  BP and general medical  condition.  F/U surgical evaluation as discussed  F/U neurological evaluation.  May consider radiofrequency rhizolysis or intraspinal procedures  pending response to present treatment and F/U evaluation.  [facility] to [facility] should patient have concerns prior to scheduled return appointment.    Review of Systems     Objective:   Physical Exam        Assessment & Plan:

## 2014-07-25 ENCOUNTER — Encounter: Payer: Self-pay | Admitting: Family Medicine

## 2014-07-25 ENCOUNTER — Other Ambulatory Visit: Payer: Self-pay

## 2014-07-25 ENCOUNTER — Telehealth: Payer: Self-pay | Admitting: *Deleted

## 2014-07-25 MED ORDER — VENLAFAXINE HCL ER 75 MG PO CP24: ORAL_CAPSULE | ORAL | Status: DC

## 2014-07-25 NOTE — Telephone Encounter (Signed)
Left voice mail

## 2014-07-30 MED ORDER — ATORVASTATIN CALCIUM 10 MG PO TABS: 10.0000 mg | ORAL_TABLET | Freq: Every day | ORAL | Status: DC

## 2014-07-30 NOTE — Addendum Note (Signed)
Addended by: [name] on: 07/30/2014 12:53 PM   Modules accepted: Orders

## 2014-08-03 ENCOUNTER — Encounter: Payer: Self-pay | Admitting: Family Medicine

## 2014-08-07 ENCOUNTER — Encounter: Payer: Self-pay | Admitting: Family Medicine

## 2014-08-09 MED ORDER — DULOXETINE HCL 30 MG PO CPEP: ORAL_CAPSULE | ORAL | Status: DC

## 2014-08-18 ENCOUNTER — Telehealth: Payer: Self-pay | Admitting: Family Medicine

## 2014-08-18 ENCOUNTER — Other Ambulatory Visit: Payer: Self-pay | Admitting: Family Medicine

## 2014-08-18 MED ORDER — DULOXETINE HCL 30 MG PO CPEP: 30.0000 mg | ORAL_CAPSULE | Freq: Every day | ORAL | Status: DC

## 2014-08-18 NOTE — Telephone Encounter (Signed)
MyChart message to pt stating:     I have received a prior authorization request on the Cymbalta because I wrote for two tablets daily after taking one tablet daily for two weeks. I have sent a new prescription to your pharmacy for one tablet daily of Cymbalta 30mg  daily. After taking Cymbalta 30mg  one tablet daily for one month, please email me to send in a prescription to increase to 60mg  tablet if you are tolerating the 30mg  of Cymbalta daily.

## 2014-08-22 ENCOUNTER — Ambulatory Visit: Payer: No Typology Code available for payment source | Attending: Pain Medicine | Admitting: Pain Medicine

## 2014-08-22 ENCOUNTER — Encounter: Payer: Self-pay | Admitting: Pain Medicine

## 2014-08-22 VITALS — BP 113/75 | HR 103 | Temp 98.6°F | Resp 20 | Ht 67.0 in | Wt 230.0 lb

## 2014-08-22 DIAGNOSIS — M170 Bilateral primary osteoarthritis of knee: Secondary | ICD-10-CM

## 2014-08-22 DIAGNOSIS — M5030 Other cervical disc degeneration, unspecified cervical region: Secondary | ICD-10-CM | POA: Insufficient documentation

## 2014-08-22 DIAGNOSIS — M47818 Spondylosis without myelopathy or radiculopathy, sacral and sacrococcygeal region: Secondary | ICD-10-CM

## 2014-08-22 DIAGNOSIS — M160 Bilateral primary osteoarthritis of hip: Secondary | ICD-10-CM

## 2014-08-22 DIAGNOSIS — M545 Low back pain: Secondary | ICD-10-CM | POA: Diagnosis present

## 2014-08-22 DIAGNOSIS — M79604 Pain in right leg: Secondary | ICD-10-CM | POA: Diagnosis present

## 2014-08-22 DIAGNOSIS — M5136 Other intervertebral disc degeneration, lumbar region: Secondary | ICD-10-CM

## 2014-08-22 DIAGNOSIS — M7061 Trochanteric bursitis, right hip: Secondary | ICD-10-CM

## 2014-08-22 DIAGNOSIS — M461 Sacroiliitis, not elsewhere classified: Secondary | ICD-10-CM

## 2014-08-22 DIAGNOSIS — M47816 Spondylosis without myelopathy or radiculopathy, lumbar region: Secondary | ICD-10-CM

## 2014-08-22 DIAGNOSIS — M7060 Trochanteric bursitis, unspecified hip: Secondary | ICD-10-CM | POA: Diagnosis not present

## 2014-08-22 DIAGNOSIS — M7062 Trochanteric bursitis, left hip: Secondary | ICD-10-CM

## 2014-08-22 DIAGNOSIS — M51369 Other intervertebral disc degeneration, lumbar region without mention of lumbar back pain or lower extremity pain: Secondary | ICD-10-CM

## 2014-08-22 DIAGNOSIS — M79605 Pain in left leg: Secondary | ICD-10-CM | POA: Diagnosis present

## 2014-08-22 MED ORDER — TRAMADOL HCL 50 MG PO TABS: ORAL_TABLET | ORAL | Status: DC

## 2014-08-22 MED ORDER — HYDROCODONE-ACETAMINOPHEN 7.5-325 MG PO TABS: ORAL_TABLET | ORAL | Status: DC

## 2014-08-22 NOTE — Patient Instructions (Addendum)
Continue present medications  F/U PCP Dr. Tamala Julian for evaliation of  BP and general medical  condition.  F/U surgical evaluation  F/U neurological evaluation  May consider radiofrequency rhizolysis or intraspinal procedures pending response to present treatment and F/U evaluation.  Patient to call [facility] should patient have concerns prior to scheduled return appointment. Ultram and hydrocodone acetaminophen You were given scripts for Tramadol and Hydrocodone today.

## 2014-08-22 NOTE — Progress Notes (Signed)
Subjective:    Patient ID: Bethany Scott, female    DOB: 13-Jan-1956, 59 y.o.   MRN: [id number]  HPI  Patient is 59 year old female who [facility] further evaluation and treatment of pain involving the lower back and lower extremity region with pain occurring in the hips and knees in addition to the lower back and lower extremity region. On today's visit we discussed patient's condition and patient stated that she is doing quite well at this time. We will avoid interventional treatment at this time and will remain available to consider additional interventional treatment as well as modification of medications and other aspects of patient's treatment regimen should there be any significant change in patient's condition. The patient was understanding and in agreement with suggested treatment plan. At the present time the patient is status post geniculate nerve blocks of the knee as well as hip injection and states that she is doing quite well at this time.    Review of Systems     Objective:   Physical Exam  There was tenderness over the splenius capitis and occipitalis musculature region of mild degree. There was mild tenderness over the cervical facet cervical paraspinal musculature region as well as the thoracic facet thoracic paraspinal musculature region. Palpation of the acromioclavicular glenohumeral joint regions were with mild tends to palpation. Patient appeared to be with bilaterally equal grip strength. Tinel and Phalen's maneuver were without increase of pain of significant degree. Palpation of the lumbar paraspinal muscular region lumbar facet region was a tends to palpation of mild to moderate degree. Lateral bending and rotation and extension and palpation over the lumbar facets reproduce mild to moderate discomfort. With mild tenderness over the PSIS and PII S regions. No excessive tends to palpation of the knees noted. There was mild to moderate tenderness  of the greater trochanteric region iliotibial band region. Patient with mild difficulty attempt attempted to stand on tiptoes and heels. There was negative clonus negative Homans. No definite sensory deficit of dermatomal dissertation was detected. Straight leg raising was tolerates approximately 30 without definite increased pain with dorsiflexion noted. Abdomen nontender and no costovertebral maintenance noted.      Assessment & Plan:    Progress Notes   JUANNA PUDLO (MR# [id number])      Progress Notes Info    Author Note Status Last Update User Last Update Date/Time   Mohammed Kindle, MD Signed Mohammed Kindle, MD 06/25/2014 4:15 PM    Progress Notes    Expand All Collapse All     Subjective:    Patient ID: Bethany Scott, female DOB: 1955-10-23, 59 y.o. MRN: [id number]  HPI  patient is 59 year old female returns to [facility] for further evaluation and treatment of pain involving the lower back lower extremity region especially the region of the knees. Patient is status post geniculate nerve blocks of the knees and states that she is doing very well at this time. We discussed patient's treatment regimen and will continue present medications and avoid additional interventional treatment at this time. The patient is understanding and in agreement with suggested treatment plan. The patient does have an element of bursitis which we will consider to the bursitis become's more severe and patient may call [facility] to request scheduling for injection if she feels that such is necessary. We will continue present treatment as discussed and will remain available to modified treatment regimen pending follow-up evaluation. All understanding and in agreement with  suggested treatment plan.     Review of Systems     Objective:   Physical Exam  mild tenderness of the splenius capitis and occipitalis regions. There was mild tenderness of the  acromioclavicular and glenohumeral joint region. Palpation of the cervical and thoracic paraspinal musculature region and facet regions with tenderness to palpation of mild degree Tinel and Phalen's maneuver were without significantly increased pain. Grip strength appeared to be equal No crepitus of the thoracic region noted. Palpation over the region of the thoracic and lumbar regions positive tennis to palpation of mild to moderate degree.. Straight leg raising was tolerates approximately 30 without increased pain with dorsiflexion noted. The knees were crepitus of the knees with negative anterior and posterior drawer signs without ballottement of the patella. Increased warmth erythema of the knees noted. Mild tenderness of the knees noted lower extremities were without evidence of sensory deficit of dermatomal distribution. 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 Theabdomen falls nontender and no costovertebral angle tenderness noted      Assessment & Plan:  Degenerative joint disease of knees  Degenerative disc disease of spine  Greater trochanteric bursitis  Degenerative joint disease of hips    Plan    Continue present medications tramadol(Ultram) and hydrocodone acetaminophen  F/U PCP Dr. Tamala Julian for evaliation of BP and general medical condition.  F/U surgical evaluation as needed.  F/U neurological evaluation as needed  F/U Dr. Cindie Laroche: Jef[facility] Bryant  May consider radiofrequency rhizolysis or intraspinal procedures pending response to present treatment and F/U evaluation.  Patient to call [facility] should patient have concerns prior to scheduled return appointment.

## 2014-08-27 ENCOUNTER — Other Ambulatory Visit: Payer: Self-pay | Admitting: Family Medicine

## 2014-08-29 ENCOUNTER — Other Ambulatory Visit: Payer: Self-pay | Admitting: Pain Medicine

## 2014-09-04 ENCOUNTER — Encounter: Payer: Self-pay | Admitting: Family Medicine

## 2014-09-05 MED ORDER — DULOXETINE HCL 60 MG PO CPEP: 60.0000 mg | ORAL_CAPSULE | Freq: Every day | ORAL | Status: DC

## 2014-09-08 ENCOUNTER — Encounter: Payer: Self-pay | Admitting: Family Medicine

## 2014-09-23 ENCOUNTER — Ambulatory Visit: Payer: No Typology Code available for payment source | Attending: Pain Medicine | Admitting: Pain Medicine

## 2014-09-23 ENCOUNTER — Encounter: Payer: Self-pay | Admitting: Pain Medicine

## 2014-09-23 VITALS — BP 135/78 | HR 95 | Temp 96.9°F | Resp 16 | Ht 67.0 in | Wt 230.0 lb

## 2014-09-23 DIAGNOSIS — M7060 Trochanteric bursitis, unspecified hip: Secondary | ICD-10-CM | POA: Diagnosis not present

## 2014-09-23 DIAGNOSIS — M170 Bilateral primary osteoarthritis of knee: Secondary | ICD-10-CM | POA: Diagnosis not present

## 2014-09-23 DIAGNOSIS — M47816 Spondylosis without myelopathy or radiculopathy, lumbar region: Secondary | ICD-10-CM

## 2014-09-23 DIAGNOSIS — M5136 Other intervertebral disc degeneration, lumbar region: Secondary | ICD-10-CM

## 2014-09-23 DIAGNOSIS — M533 Sacrococcygeal disorders, not elsewhere classified: Secondary | ICD-10-CM | POA: Insufficient documentation

## 2014-09-23 DIAGNOSIS — M545 Low back pain: Secondary | ICD-10-CM | POA: Diagnosis present

## 2014-09-23 DIAGNOSIS — M7061 Trochanteric bursitis, right hip: Secondary | ICD-10-CM

## 2014-09-23 DIAGNOSIS — M79605 Pain in left leg: Secondary | ICD-10-CM | POA: Diagnosis present

## 2014-09-23 DIAGNOSIS — M51369 Other intervertebral disc degeneration, lumbar region without mention of lumbar back pain or lower extremity pain: Secondary | ICD-10-CM

## 2014-09-23 DIAGNOSIS — M47818 Spondylosis without myelopathy or radiculopathy, sacral and sacrococcygeal region: Secondary | ICD-10-CM

## 2014-09-23 DIAGNOSIS — M160 Bilateral primary osteoarthritis of hip: Secondary | ICD-10-CM

## 2014-09-23 DIAGNOSIS — M79604 Pain in right leg: Secondary | ICD-10-CM | POA: Diagnosis present

## 2014-09-23 DIAGNOSIS — M7062 Trochanteric bursitis, left hip: Secondary | ICD-10-CM

## 2014-09-23 DIAGNOSIS — M461 Sacroiliitis, not elsewhere classified: Secondary | ICD-10-CM

## 2014-09-23 MED ORDER — TRAMADOL HCL 50 MG PO TABS: ORAL_TABLET | ORAL | Status: DC

## 2014-09-23 MED ORDER — HYDROCODONE-ACETAMINOPHEN 7.5-325 MG PO TABS
ORAL_TABLET | ORAL | Status: DC
Start: 2014-09-23 — End: 2014-10-23

## 2014-09-23 NOTE — Progress Notes (Signed)
   Subjective:    Patient ID: Bethany Scott, female    DOB: 12/13/1955, 59 y.o.   MRN: [id number]  HPI  Patient 59 year old female returns to [facility] for further evaluation and treatment of pain involving the region of the lower back and lower extremity region. Patient states that she had improvement of her hip following injection of the hip. At the present time patient states that she wishes to undergo injection on the opposite side. We discussed patient's condition and will schedule patient for hip injection. We will continue medications including tramadol and hydrocodone acetaminophen and will remain available to consider patient for additional modification of treatment regimen pending response to treatment and follow-up evaluation. The patient was with understanding and in agreement with suggested treatment plan. Patient denied and recent trauma change in events of daily living the call significant changes of the pathology.    Review of Systems     Objective:   Physical Exam  There was mild tenderness of the splenius capitis and occipitalis musketry region. There was mild tennis over the region cervical facet cervical paraspinal musculature region. Patient appeared to be unremarkable Spurling's maneuver. There was tends to palpation over the thoracic facet thoracic paraspinal muscles region of mild degree. There was minimal tenderness of the acromioclavicular and glenohumeral joint regions. Tinel and Phalen's maneuver. were without increased pain of significant degree and patient appeared to be with bilaterally equal grip strength. Palpation over the region of the lower thoracic region was evidence of muscle spasms of mild to moderate degree. No crepitus of the thoracic region noted. Palpation over the lumbar paraspinal muscle lumbar facet region associated with moderate discomfort. Lateral bending and rotation and extension and palpation of the lumbar facets reproduce moderate  discomfort. Patient was with increased pain of moderate to moderately severe degree with Patrick's maneuver. Palpation over the region of the PSIS and PII S region reproduced moderate to moderately severe to severe discomfort as well EHL strength appeared to be decreased without a definite sensory deficit of dermatomal distribution detected. There was negative clonus negative Homans. Abdomen nontender with no costovertebral tenderness noted.      Assessment & Plan:    Degenerative joint disease of hips  Degenerative joint disease of knees  Degenerative disc disease of spine  Greater trochanteric bursitis  Sacroiliac joint dysfunction   Plan  Continue present medications tramadol and hydrocodone acetaminophen  Hip injection to be performed at time return appointment  F/U PCP Dr. Zannie Cove  for evaliation of  BP and general medical  condition  F/U surgical evaluation to be considered as discussed  F/U neurological evaluation. Will avoid at this time and may consider pending follow-up evaluations  May consider radiofrequency rhizolysis or intraspinal procedures pending response to present treatment and F/U evaluation   Patient to call [facility] should patient have concerns prior to scheduled return appointment.

## 2014-09-23 NOTE — Progress Notes (Signed)
Discharge instructions given with teach back 3 done.  Pre procedure instructions given.  Script on hand for tramadol and hydrocodone.

## 2014-09-23 NOTE — Patient Instructions (Addendum)
Continue present medications tramadol and hydrocodone acetaminophen  Hip injection to be performed at time of return appointment as discussed  F/U PCP Dr. Tamala Julian for evaliation of  BP and general medical  condition  F/U surgical evaluation  F/U neurological evaluation  May consider radiofrequency rhizolysis or intraspinal procedures pending response to present treatment and F/U evaluation   Patient to call [facility] should patient have concerns prior to scheduled return appointment. GENERAL RISKS AND COMPLICATIONS  What are the risk, side effects and possible complications? Generally speaking, most procedures are safe.  However, with any procedure there are risks, side effects, and the possibility of complications.  The risks and complications are dependent upon the sites that are lesioned, or the type of nerve block to be performed.  The closer the procedure is to the spine, the more serious the risks are.  Great care is taken when placing the radio frequency needles, block needles or lesioning probes, but sometimes complications can occur. 1. Infection: Any time there is an injection through the skin, there is a risk of infection.  This is why sterile conditions are used for these blocks.  There are four possible types of infection. 1. Localized skin infection. 2. Central Nervous System Infection-This can be in the form of Meningitis, which can be deadly. 3. Epidural Infections-This can be in the form of an epidural abscess, which can cause pressure inside of the spine, causing compression of the spinal cord with subsequent paralysis. This would require an emergency surgery to decompress, and there are no guarantees that the patient would recover from the paralysis. 4. Discitis-This is an infection of the intervertebral discs.  It occurs in about 1% of discography procedures.  It is difficult to treat and it may lead to surgery.        2. Pain: the needles have to go through skin  and soft tissues, will cause soreness.       3. Damage to internal structures:  The nerves to be lesioned may be near blood vessels or    other nerves which can be potentially damaged.       4. Bleeding: Bleeding is more common if the patient is taking blood thinners such as  aspirin, Coumadin, Ticiid, Plavix, etc., or if he/she have some genetic predisposition  such as hemophilia. Bleeding into the spinal canal can cause compression of the spinal  cord with subsequent paralysis.  This would require an emergency surgery to  decompress and there are no guarantees that the patient would recover from the  paralysis.       5. Pneumothorax:  Puncturing of a lung is a possibility, every time a needle is introduced in  the area of the chest or upper back.  Pneumothorax refers to free air around the  collapsed lung(s), inside of the thoracic cavity (chest cavity).  Another two possible  complications related to a similar event would include: Hemothorax and Chylothorax.   These are variations of the Pneumothorax, where instead of air around the collapsed  lung(s), you may have blood or chyle, respectively.       6. Spinal headaches: They may occur with any procedures in the area of the spine.       7. Persistent CSF (Cerebro-Spinal Fluid) leakage: This is a rare problem, but may occur  with prolonged intrathecal or epidural catheters either due to the formation of a fistulous  track or a dural tear.       8. Nerve damage:  By working so close to the spinal cord, there is always a possibility of  nerve damage, which could be as serious as a permanent spinal cord injury with  paralysis.       9. Death:  Although rare, severe deadly allergic reactions known as "Anaphylactic  reaction" can occur to any of the medications used.      10. Worsening of the symptoms:  We can always make thing worse.  What are the chances of something like this happening? Chances of any of this occuring are extremely low.  By statistics,  you have more of a chance of getting killed in a motor vehicle accident: while driving to the hospital than any of the above occurring .  Nevertheless, you should be aware that they are possibilities.  In general, it is similar to taking a shower.  Everybody knows that you can slip, hit your head and get killed.  Does that mean that you should not shower again?  Nevertheless always keep in mind that statistics do not mean anything if you happen to be on the wrong side of them.  Even if a procedure has a 1 (one) in a 1,000,000 (million) chance of going wrong, it you happen to be that one..Also, keep in mind that by statistics, you have more of a chance of having something go wrong when taking medications.  Who should not have this procedure? If you are on a blood thinning medication (e.g. Coumadin, Plavix, see list of "Blood Thinners"), or if you have an active infection going on, you should not have the procedure.  If you are taking any blood thinners, please inform your physician.  How should I prepare for this procedure?  Do not eat or drink anything at least six hours prior to the procedure.  Bring a driver with you .  It cannot be a taxi.  Come accompanied by an adult that can drive you back, and that is strong enough to help you if your legs get weak or numb from the local anesthetic.  Take all of your medicines the morning of the procedure with just enough water to swallow them.  If you have diabetes, make sure that you are scheduled to have your procedure done first thing in the morning, whenever possible.  If you have diabetes, take only half of your insulin dose and notify our nurse that you have done so as soon as you arrive at the clinic.  If you are diabetic, but only take blood sugar pills (oral hypoglycemic), then do not take them on the morning of your procedure.  You may take them after you have had the procedure.  Do not take aspirin or any aspirin-containing medications, at  least eleven (11) days prior to the procedure.  They may prolong bleeding.  Wear loose fitting clothing that may be easy to take off and that you would not mind if it got stained with Betadine or blood.  Do not wear any jewelry or perfume  Remove any nail coloring.  It will interfere with some of our monitoring equipment.  NOTE: Remember that this is not meant to be interpreted as a complete list of all possible complications.  Unforeseen problems may occur.  BLOOD THINNERS The following drugs contain aspirin or other products, which can cause increased bleeding during surgery and should not be taken for 2 weeks prior to and 1 week after surgery.  If you should need take something for relief of minor pain, you may  take acetaminophen which is found in Tylenol,m Datril, Anacin-3 and Panadol. It is not blood thinner. The products listed below are.  Do not take any of the products listed below in addition to any listed on your instruction sheet.  A.P.C or A.P.C with Codeine Codeine Phosphate Capsules #3 Ibuprofen Ridaura  ABC compound Congesprin Imuran rimadil  Advil Cope Indocin Robaxisal  Alka-Seltzer Effervescent Pain Reliever and Antacid Coricidin or Coricidin-D  Indomethacin Rufen  Alka-Seltzer plus Cold Medicine Cosprin Ketoprofen S-A-C Tablets  Anacin Analgesic Tablets or Capsules Coumadin Korlgesic Salflex  Anacin Extra Strength Analgesic tablets or capsules CP-2 Tablets Lanoril Salicylate  Anaprox Cuprimine Capsules Levenox Salocol  Anexsia-D Dalteparin Magan Salsalate  Anodynos Darvon compound Magnesium Salicylate Sine-off  Ansaid Dasin Capsules Magsal Sodium Salicylate  Anturane Depen Capsules Marnal Soma  APF Arthritis pain formula Dewitt's Pills Measurin Stanback  Argesic Dia-Gesic Meclofenamic Sulfinpyrazone  Arthritis Bayer Timed Release Aspirin Diclofenac Meclomen Sulindac  Arthritis pain formula Anacin Dicumarol Medipren Supac  Analgesic (Safety coated) Arthralgen  Diffunasal Mefanamic Suprofen  Arthritis Strength Bufferin Dihydrocodeine Mepro Compound Suprol  Arthropan liquid Dopirydamole Methcarbomol with Aspirin Synalgos  ASA tablets/Enseals Disalcid Micrainin Tagament  Ascriptin Doan's Midol Talwin  Ascriptin A/D Dolene Mobidin Tanderil  Ascriptin Extra Strength Dolobid Moblgesic Ticlid  Ascriptin with Codeine Doloprin or Doloprin with Codeine Momentum Tolectin  Asperbuf Duoprin Mono-gesic Trendar  Aspergum Duradyne Motrin or Motrin IB Triminicin  Aspirin plain, buffered or enteric coated Durasal Myochrisine Trigesic  Aspirin Suppositories Easprin Nalfon Trillsate  Aspirin with Codeine Ecotrin Regular or Extra Strength Naprosyn Uracel  Atromid-S Efficin Naproxen Ursinus  Auranofin Capsules Elmiron Neocylate Vanquish  Axotal Emagrin Norgesic Verin  Azathioprine Empirin or Empirin with Codeine Normiflo Vitamin E  Azolid Emprazil Nuprin Voltaren  Bayer Aspirin plain, buffered or children's or timed BC Tablets or powders Encaprin Orgaran Warfarin Sodium  Buff-a-Comp Enoxaparin Orudis Zorpin  Buff-a-Comp with Codeine Equegesic Os-Cal-Gesic   Buffaprin Excedrin plain, buffered or Extra Strength Oxalid   Bufferin Arthritis Strength Feldene Oxphenbutazone   Bufferin plain or Extra Strength Feldene Capsules Oxycodone with Aspirin   Bufferin with Codeine Fenoprofen Fenoprofen Pabalate or Pabalate-SF   Buffets II Flogesic Panagesic   Buffinol plain or Extra Strength Florinal or Florinal with Codeine Panwarfarin   Buf-Tabs Flurbiprofen Penicillamine   Butalbital Compound Four-way cold tablets Penicillin   Butazolidin Fragmin Pepto-Bismol   Carbenicillin Geminisyn Percodan   Carna Arthritis Reliever Geopen Persantine   Carprofen Gold's salt Persistin   Chloramphenicol Goody's Phenylbutazone   Chloromycetin Haltrain Piroxlcam   Clmetidine heparin Plaquenil   Cllnoril Hyco-pap Ponstel   Clofibrate Hydroxy chloroquine Propoxyphen         Before  stopping any of these medications, be sure to consult the physician who ordered them.  Some, such as Coumadin (Warfarin) are ordered to prevent or treat serious conditions such as "deep thrombosis", "pumonary embolisms", and other heart problems.  The amount of time that you may need off of the medication may also vary with the medication and the reason for which you were taking it.  If you are taking any of these medications, please make sure you notify your pain physician before you undergo any procedures.

## 2014-09-25 ENCOUNTER — Encounter: Payer: No Typology Code available for payment source | Admitting: Pain Medicine

## 2014-10-08 ENCOUNTER — Telehealth: Payer: Self-pay | Admitting: Pain Medicine

## 2014-10-08 NOTE — Telephone Encounter (Signed)
Patient is calling to see if Aurea Graff approved her procedure / she has appt on Wed 10-23-14 for med refill but could do procedure that day if possible?

## 2014-10-23 ENCOUNTER — Ambulatory Visit: Payer: No Typology Code available for payment source | Attending: Pain Medicine | Admitting: Pain Medicine

## 2014-10-23 ENCOUNTER — Encounter: Payer: Self-pay | Admitting: Pain Medicine

## 2014-10-23 VITALS — BP 129/86 | HR 87 | Temp 96.1°F | Resp 18 | Ht 67.0 in | Wt 230.0 lb

## 2014-10-23 DIAGNOSIS — M47818 Spondylosis without myelopathy or radiculopathy, sacral and sacrococcygeal region: Secondary | ICD-10-CM

## 2014-10-23 DIAGNOSIS — M170 Bilateral primary osteoarthritis of knee: Secondary | ICD-10-CM

## 2014-10-23 DIAGNOSIS — M25559 Pain in unspecified hip: Secondary | ICD-10-CM

## 2014-10-23 DIAGNOSIS — M545 Low back pain: Secondary | ICD-10-CM | POA: Diagnosis present

## 2014-10-23 DIAGNOSIS — M7061 Trochanteric bursitis, right hip: Secondary | ICD-10-CM

## 2014-10-23 DIAGNOSIS — M25552 Pain in left hip: Secondary | ICD-10-CM | POA: Insufficient documentation

## 2014-10-23 DIAGNOSIS — M7062 Trochanteric bursitis, left hip: Secondary | ICD-10-CM

## 2014-10-23 DIAGNOSIS — M461 Sacroiliitis, not elsewhere classified: Secondary | ICD-10-CM

## 2014-10-23 DIAGNOSIS — M5136 Other intervertebral disc degeneration, lumbar region: Secondary | ICD-10-CM

## 2014-10-23 DIAGNOSIS — M160 Bilateral primary osteoarthritis of hip: Secondary | ICD-10-CM

## 2014-10-23 DIAGNOSIS — M47816 Spondylosis without myelopathy or radiculopathy, lumbar region: Secondary | ICD-10-CM

## 2014-10-23 DIAGNOSIS — M25569 Pain in unspecified knee: Secondary | ICD-10-CM

## 2014-10-23 MED ORDER — CIPROFLOXACIN HCL 250 MG PO TABS: 250.0000 mg | ORAL_TABLET | Freq: Two times a day (BID) | ORAL | Status: DC

## 2014-10-23 MED ORDER — BUPIVACAINE HCL (PF) 0.25 % IJ SOLN
INTRAMUSCULAR | Status: AC
  Administered 2014-10-23: 30 mL
  Filled 2014-10-23: qty 30

## 2014-10-23 MED ORDER — MIDAZOLAM HCL 5 MG/5ML IJ SOLN
INTRAMUSCULAR | Status: AC
  Administered 2014-10-23: 4 mg via INTRAVENOUS
  Filled 2014-10-23: qty 5

## 2014-10-23 MED ORDER — HYDROCODONE-ACETAMINOPHEN 7.5-325 MG PO TABS: ORAL_TABLET | ORAL | Status: DC

## 2014-10-23 MED ORDER — FENTANYL CITRATE (PF) 100 MCG/2ML IJ SOLN
INTRAMUSCULAR | Status: AC
  Administered 2014-10-23: 100 ug via INTRAVENOUS
  Filled 2014-10-23: qty 2

## 2014-10-23 MED ORDER — CIPROFLOXACIN IN D5W 400 MG/200ML IV SOLN
INTRAVENOUS | Status: AC
  Administered 2014-10-23: 400 mg via INTRAVENOUS
  Filled 2014-10-23: qty 200

## 2014-10-23 MED ORDER — METHYLPREDNISOLONE ACETATE 40 MG/ML IJ SUSP
INTRAMUSCULAR | Status: AC
  Administered 2014-10-23: 40 mg
  Filled 2014-10-23: qty 1

## 2014-10-23 MED ORDER — TRIAMCINOLONE ACETONIDE 40 MG/ML IJ SUSP
INTRAMUSCULAR | Status: AC
  Filled 2014-10-23: qty 1

## 2014-10-23 MED ORDER — ORPHENADRINE CITRATE 30 MG/ML IJ SOLN
INTRAMUSCULAR | Status: AC
  Administered 2014-10-23: 60 mg
  Filled 2014-10-23: qty 2

## 2014-10-23 MED ORDER — TRAMADOL HCL 50 MG PO TABS: ORAL_TABLET | ORAL | Status: DC

## 2014-10-23 NOTE — Patient Instructions (Addendum)
PLAN  Continue present medication tramadol and hydrocodone acetaminophen and begin taking antibiotic Cipro as prescribed. Please obtain your antibiotic today and begin taking antibiotic Cipro today  F/U PCP Dr.K Tamala Julian  for evaliation of  BP and general medical  condition.  F/U surgical evaluation. May consider pending follow-up evaluations  F/U neurological evaluation. May consider pending follow-up evaluations  May consider radiofrequency rhizolysis or intraspinal procedures pending response to present treatment and F/U evaluation.  Patient to call Pain Management Center should patient have concerns prior to scheduled return appointment.  Pain Management Discharge Instructions  General Discharge Instructions :  If you need to reach your doctor call: Monday-Friday 8:00 am - 4:00 pm at [phone] or toll free [phone].  After clinic hours [phone] to have operator reach doctor.  Bring all of your medication bottles to all your appointments in the pain clinic.  To cancel or reschedule your appointment with Pain Management please remember to call 24 hours in advance to avoid a fee.  Refer to the educational materials which you have been given on: General Risks, I had my Procedure. Discharge Instructions, Post Sedation.  Post Procedure Instructions:  The drugs you were given will stay in your system until tomorrow, so for the next 24 hours you should not drive, make any legal decisions or drink any alcoholic beverages.  You may eat anything you prefer, but it is better to start with liquids then soups and crackers, and gradually work up to solid foods.  Please notify your doctor immediately if you have any unusual bleeding, trouble breathing or pain that is not related to your normal pain.  Depending on the type of procedure that was done, some parts of your body may feel week and/or numb.  This usually clears up by tonight or the next day.  Walk with the use of an  assistive device or accompanied by an adult for the 24 hours.  You may use ice on the affected area for the first 24 hours.  Put ice in a Ziploc bag and cover with a towel and place against area 15 minutes on 15 minutes off.  You may switch to heat after 24 hours.Joint Injection Care After Refer to this sheet in the next few days. These instructions provide you with information on caring for yourself after you have had a joint injection. Your caregiver also may give you more specific instructions. Your treatment has been planned according to current medical practices, but problems sometimes occur. Call your caregiver if you have any problems or questions after your procedure. After any type of joint injection, it is not uncommon to experience:  Soreness, swelling, or bruising around the injection site.  Mild numbness, tingling, or weakness around the injection site caused by the numbing medicine used before or with the injection. It also is possible to experience the following effects associated with the specific agent after injection:  Iodine-based contrast agents:  Allergic reaction (itching, hives, widespread redness, and swelling beyond the injection site).  Corticosteroids (These effects are rare.):  Allergic reaction.  Increased blood sugar levels (If you have diabetes and you notice that your blood sugar levels have increased, notify your caregiver).  Increased blood pressure levels.  Mood swings.  Hyaluronic acid in the use of viscosupplementation.  Temporary heat or redness.  Temporary rash and itching.  Increased fluid accumulation in the injected joint. These effects all should resolve within a day after your procedure.  HOME CARE INSTRUCTIONS  Limit yourself to light activity  the day of your procedure. Avoid lifting heavy objects, bending, stooping, or twisting.  Take prescription or over-the-counter pain medication as directed by your caregiver.  You may apply ice  to your injection site to reduce pain and swelling the day of your procedure. Ice may be applied 03-04 times:  Put ice in a plastic bag.  Place a towel between your skin and the bag.  Leave the ice on for no longer than 15-20 minutes each time. SEEK IMMEDIATE MEDICAL CARE IF:   Pain and swelling get worse rather than better or extend beyond the injection site.  Numbness does not go away.  Blood or fluid continues to leak from the injection site.  You have chest pain.  You have swelling of your face or tongue.  You have trouble breathing or you become dizzy.  You develop a fever, chills, or severe tenderness at the injection site that last longer than 1 day. MAKE SURE YOU:  Understand these instructions.  Watch your condition.  Get help right away if you are not doing well or if you get worse. Document Released: 09/24/2010 Document Revised: 04/05/2011 Document Reviewed: 09/24/2010 [facility] Patient Information 2015 [place], [place]. This information is not intended to replace advice given to you by your health care provider. Make sure you discuss any questions you have with your health care provider.

## 2014-10-23 NOTE — Progress Notes (Signed)
Safety precautions to be maintained throughout the outpatient stay will include: orient to surroundings, keep bed in low position, maintain call bell within reach at all times, provide assistance with transfer out of bed and ambulation.  

## 2014-10-23 NOTE — Progress Notes (Signed)
   Subjective:    Patient ID: AUTYM SIESS, female    DOB: 07-15-1955, 59 y.o.   MRN: [id number]  HPI                                                  LEFT HIP INJECTION   The patient is a 59 year old female who returns to [facility] for further evaluation and treatment of pain involving the lower back and lower extremity region with significant pain involving the region of the left hip. She is with known degenerative changes of the left hip. Patient's pain is aggravated by standing walking. We will discuss patient's condition and will proceed with interventional treatment consisting of left hip injection under fluoroscopic guidance in attempt to decrease severity of patient's symptoms, minimize progression of patient's symptoms, and avoid need for more involved treatment. The patient was in agreement with suggested treatment plan.    Description Of Procedure : Left Hip Injection  Patient was taken to fluoroscopy suite and patient assumed the supine position. The patient was monitored with EKG, blood pressure, pulse, and pulse oximetry monitoring throughout the entire procedure. IV Versed and IV fentanyl conscious sedation was administered incrementally during the procedure. Betadine prep of proposed entry site was accomplished and a local anesthetic skin wheal of 1.5% lidocaine was administered at the proposed needle entry site. A 22-gauge needle was inserted under fluoroscopic guidance at the junction of the head of the femur and neck of the femur of the left hip. Under fluoroscopic guidance a total of 2 cc of 0.25% bupivacaine with Depo-Medrol was injected for left hip injection. The patient tolerated procedure well  Myoneural block injection of the left hip Following Betadine prep of proposed entry site a 22-gauge needle was inserted in the region of the left hip musculature region and a total of 5 cc of 0.25% bupivacaine with Norflex was injected for myoneural block injection 2  in the region of the left hip musculature region. The patient tolerated the procedure well  A total of 40 mg of Depo-Medrol was utilized for the procedure   PLAN   Continue present medications Tramadol and hydrocodone acetaminophen  F/U PCP  Dr.K Tamala Julian  for evaliation of  BP and general medical  condition  F/U surgical evaluation. May consider pending follow-up evaluations  F/U neurological evaluation. May consider pending follow-up evaluations  May consider radiofrequency rhizolysis or intraspinal procedures pending response to present treatment and F/U evaluation   Patient to call Pain Management Center should patient have concerns prior to scheduled return appointment.      Review of Systems     Objective:   Physical Exam        Assessment & Plan:

## 2014-10-24 ENCOUNTER — Telehealth: Payer: Self-pay | Admitting: *Deleted

## 2014-10-24 NOTE — Telephone Encounter (Signed)
Left voice mail

## 2014-11-08 ENCOUNTER — Other Ambulatory Visit: Payer: Self-pay | Admitting: Family Medicine

## 2014-11-09 ENCOUNTER — Other Ambulatory Visit: Payer: Self-pay | Admitting: Family Medicine

## 2014-11-11 ENCOUNTER — Telehealth: Payer: Self-pay | Admitting: Pain Medicine

## 2014-11-11 ENCOUNTER — Other Ambulatory Visit: Payer: Self-pay | Admitting: Pain Medicine

## 2014-11-11 DIAGNOSIS — M533 Sacrococcygeal disorders, not elsewhere classified: Secondary | ICD-10-CM

## 2014-11-11 DIAGNOSIS — M160 Bilateral primary osteoarthritis of hip: Secondary | ICD-10-CM

## 2014-11-11 DIAGNOSIS — M47816 Spondylosis without myelopathy or radiculopathy, lumbar region: Secondary | ICD-10-CM

## 2014-11-11 DIAGNOSIS — M5136 Other intervertebral disc degeneration, lumbar region: Secondary | ICD-10-CM

## 2014-11-11 NOTE — Telephone Encounter (Signed)
Patient is sched Oct 27 for med refill but would like to come that week for left hip injection / has [employer] so will need prior authorization / will need order from Dr. Primus Bravo

## 2014-11-11 NOTE — Telephone Encounter (Signed)
Please call or fax in Alprazolam refill.

## 2014-11-11 NOTE — Telephone Encounter (Signed)
rx faxed to pharmacy

## 2014-11-11 NOTE — Telephone Encounter (Signed)
Bethany Scott and Nurses Schedule patient for Wednesday, October 26 for hip injection and informed patient today of plan to refill medications and perform hip injection on Wednesday, 11/20/2014 provided our staff can get insurance approval  Please request insurance approval today for hip injection to avoid unnecessary delay of patient's treatment

## 2014-11-19 ENCOUNTER — Encounter: Payer: Self-pay | Admitting: Family Medicine

## 2014-11-19 MED ORDER — DULOXETINE HCL 60 MG PO CPEP: 120.0000 mg | ORAL_CAPSULE | Freq: Every day | ORAL | Status: DC

## 2014-11-21 ENCOUNTER — Other Ambulatory Visit: Payer: Self-pay | Admitting: Family Medicine

## 2014-11-21 ENCOUNTER — Encounter: Payer: Self-pay | Admitting: Pain Medicine

## 2014-11-21 ENCOUNTER — Ambulatory Visit: Payer: No Typology Code available for payment source | Attending: Pain Medicine | Admitting: Pain Medicine

## 2014-11-21 VITALS — BP 126/74 | HR 95 | Temp 98.0°F | Resp 16 | Ht 67.0 in | Wt 232.0 lb

## 2014-11-21 DIAGNOSIS — M461 Sacroiliitis, not elsewhere classified: Secondary | ICD-10-CM | POA: Insufficient documentation

## 2014-11-21 DIAGNOSIS — M170 Bilateral primary osteoarthritis of knee: Secondary | ICD-10-CM | POA: Diagnosis not present

## 2014-11-21 DIAGNOSIS — M47816 Spondylosis without myelopathy or radiculopathy, lumbar region: Secondary | ICD-10-CM

## 2014-11-21 DIAGNOSIS — M160 Bilateral primary osteoarthritis of hip: Secondary | ICD-10-CM | POA: Diagnosis not present

## 2014-11-21 DIAGNOSIS — M47818 Spondylosis without myelopathy or radiculopathy, sacral and sacrococcygeal region: Secondary | ICD-10-CM

## 2014-11-21 DIAGNOSIS — M25559 Pain in unspecified hip: Secondary | ICD-10-CM

## 2014-11-21 DIAGNOSIS — M7060 Trochanteric bursitis, unspecified hip: Secondary | ICD-10-CM | POA: Insufficient documentation

## 2014-11-21 DIAGNOSIS — M7061 Trochanteric bursitis, right hip: Secondary | ICD-10-CM

## 2014-11-21 DIAGNOSIS — M5136 Other intervertebral disc degeneration, lumbar region: Secondary | ICD-10-CM

## 2014-11-21 DIAGNOSIS — M7062 Trochanteric bursitis, left hip: Secondary | ICD-10-CM

## 2014-11-21 DIAGNOSIS — M25569 Pain in unspecified knee: Secondary | ICD-10-CM

## 2014-11-21 MED ORDER — HYDROCODONE-ACETAMINOPHEN 7.5-325 MG PO TABS: ORAL_TABLET | ORAL | Status: DC

## 2014-11-21 MED ORDER — TRAMADOL HCL 50 MG PO TABS: ORAL_TABLET | ORAL | Status: DC

## 2014-11-21 NOTE — Progress Notes (Signed)
   Subjective:    Patient ID: Bethany Scott, female    DOB: Jun 27, 1955, 59 y.o.   MRN: [id number]  HPI  Patient is 59 year old female who returns to pain management Center for further evaluation and treatment of pain involving the lower back lower extremity region especially the region of the hips and buttocks. There is concern regarding component of patient's pain being due to degenerative joint disease of the hips as well as greater trochanteric bursitis. There is also concern regarding significant component of patient's pain being due to sacroiliac joint involvement with contrast effusion of pain from the lumbosacral spine as well with lumbar facet involvement. At the present time we will continue medications tramadol and hydrocodone acetaminophen and will consider patient for interventional treatment at time return appointment consisting of block of nerves to the sacroiliac joint. We will also discussed other treatments for interventional treatment of the hip including block of nerves to the hip. We'll consider patient for additional modifications of treatment regimen including interventional treatment pending response to present treatment regimen and follow-up evaluation. The patient was understanding and in agreement with suggested treatment plan.     Review of Systems     Objective:   Physical Exam there was tends to palpation of the splenius capitis and occipitalis musculature region. Palpation of these regions reproduced mild discomfort. There was mild tenderness of the cervical facet cervical paraspinal musculature region as well as the thoracic facet thoracic paraspinal musculature region. Patient appeared to be with bilaterally equal grip strength and Tinel and Phalen's maneuver were without increased pain of significant degree. Palpation over the region of the lumbar paraspinal muscles region lumbar facet region was tends to palpation of mild to moderate degree. Lateral bending and  rotation and extension and palpation of the lumbar facets reproducing moderate discomfort. There was moderate to moderately severe tends to palpation of the PSIS and PII S region. There was severe increase of pain with palpation of the greater trochanteric region and iliotibial band region. Patrick's maneuver associated with moderate discomfort. Straight leg raising was tolerates approximately 30 without a definite increased pain with dorsiflexion noted. The knees with tenderness to palpation in crepitus of the knees with negative anterior and posterior drawer signs without ballottement of the patella. DTRs difficult to elicit patient had difficulty relaxing. EHL strength appeared to be slightly decreased without a definite sensory deficit of dermatomal distribution of the lower extremities noted. There was negative clonus negative Homans. Abdomen was nontender with no costovertebral tenderness noted.      Assessment & Plan:   Sacroiliac joint dysfunction   Sacroiliitis  Degenerative joint disease of hips  Degenerative joint disease of knees  Degenerative disc disease of spine  Greater trochanteric bursitis    PLAN    Continue present medications tramadol and hydrocodone acetaminophen  Block of nerves to the sacroiliac joint to be performed at time return appointment  F/U PCP Dr. Zannie Cove  for evaliation of  BP and general medical  condition  F/U surgical evaluation to be considered as discussed  F/U neurological evaluation. Will avoid at this time and may consider pending follow-up evaluations  May consider radiofrequency rhizolysis or intraspinal procedures pending response to present treatment and F/U evaluation   Patient to call Pain Management Center should patient have concerns prior to scheduled return appointment.

## 2014-11-21 NOTE — Progress Notes (Signed)
Safety precautions to be maintained throughout the outpatient stay will include: orient to surroundings, keep bed in low position, maintain call bell within reach at all times, provide assistance with transfer out of bed and ambulation.  

## 2014-11-21 NOTE — Patient Instructions (Addendum)
PLAN  Continue present medications tramadol and hydrocodone acetaminophen  Block of nerves to the sacroiliac joint to be performed at time of return appointment  F/U PCP Dr. Tamala Julian for evaliation of  BP and general medical  condition  F/U surgical evaluation  F/U neurological evaluation  May consider radiofrequency rhizolysis or intraspinal procedures pending response to present treatment and F/U evaluation   Patient to call [facility] should patient have concerns prior to scheduled return appointmentSacroiliac (SI) Joint Injection Patient Information  Description: The sacroiliac joint connects the scrum (very low back and tailbone) to the ilium (a pelvic bone which also forms half of the hip joint).  Normally this joint experiences very little motion.  When this joint becomes inflamed or unstable low back and or hip and pelvis pain may result.  Injection of this joint with local anesthetics (numbing medicines) and steroids can provide diagnostic information and reduce pain.  This injection is performed with the aid of x-ray guidance into the tailbone area while you are lying on your stomach.   You may experience an electrical sensation down the leg while this is being done.  You may also experience numbness.  We also may ask if we are reproducing your normal pain during the injection.  Conditions which may be treated SI injection:   Low back, buttock, hip or leg pain  Preparation for the Injection:  1. Do not eat any solid food or dairy products within 6 hours of your appointment.  2. You may drink clear liquids up to 2 hours before appointment.  Clear liquids include water, black coffee, juice or soda.  No milk or cream please. 3. You may take your regular medications, including pain medications with a sip of water before your appointment.  Diabetics should hold regular insulin (if take separately) and take 1/2 normal NPH dose the morning of the procedure.  Carry some sugar  containing items with you to your appointment. 4. A driver must accompany you and be prepared to drive you home after your procedure. 5. Bring all of your current medications with you. 6. An IV may be inserted and sedation may be given at the discretion of the physician. 7. A blood pressure cuff, EKG and other monitors will often be applied during the procedure.  Some patients may need to have extra oxygen administered for a short period.  8. You will be asked to provide medical information, including your allergies, prior to the procedure.  We must know immediately if you are taking blood thinners (like Coumadin/Warfarin) or if you are allergic to IV iodine contrast (dye).  We must know if you could possible be pregnant.  Possible side effects:   Bleeding from needle site  Infection (rare, may require surgery)  Nerve injury (rare)  Numbness & tingling (temporary)  A brief convulsion or seizure  Light-headedness (temporary)  Pain at injection site (several days)  Decreased blood pressure (temporary)  Weakness in the leg (temporary)   Call if you experience:   New onset weakness or numbness of an extremity below the injection site that last more than 8 hours.  Hives or difficulty breathing ( go to the emergency room)  Inflammation or drainage at the injection site  Any new symptoms which are concerning to you  Please note:  Although the local anesthetic injected can often make your back/ hip/ buttock/ leg feel good for several hours after the injections, the pain will likely return.  It takes 3-7 days for steroids  to work in the sacroiliac area.  You may not notice any pain relief for at least that one week.  If effective, we will often do a series of three injections spaced 3-6 weeks apart to maximally decrease your pain.  After the initial series, we generally will wait some months before a repeat injection of the same type.  If you have any questions, please call [phone]  [phone] [place]  What are the risk, side effects and possible complications? Generally speaking, most procedures are safe.  However, with any procedure there are risks, side effects, and the possibility of complications.  The risks and complications are dependent upon the sites that are lesioned, or the type of nerve block to be performed.  The closer the procedure is to the spine, the more serious the risks are.  Great care is taken when placing the radio frequency needles, block needles or lesioning probes, but sometimes complications can occur. 1. Infection: Any time there is an injection through the skin, there is a risk of infection.  This is why sterile conditions are used for these blocks.  There are four possible types of infection. 1. Localized skin infection. 2. Central Nervous System Infection-This can be in the form of Meningitis, which can be deadly. 3. Epidural Infections-This can be in the form of an epidural abscess, which can cause pressure inside of the spine, causing compression of the spinal cord with subsequent paralysis. This would require an emergency surgery to decompress, and there are no guarantees that the patient would recover from the paralysis. 4. Discitis-This is an infection of the intervertebral discs.  It occurs in about 1% of discography procedures.  It is difficult to treat and it may lead to surgery.        2. Pain: the needles have to go through skin and soft tissues, will cause soreness.       3. Damage to internal structures:  The nerves to be lesioned may be near blood vessels or    other nerves which can be potentially damaged.       4. Bleeding: Bleeding is more common if the patient is taking blood thinners such as  aspirin, Coumadin, Ticiid, Plavix, etc., or if he/she have some genetic predisposition  such as hemophilia. Bleeding into the spinal canal can cause compression of the spinal   cord with subsequent paralysis.  This would require an emergency surgery to  decompress and there are no guarantees that the patient would recover from the  paralysis.       5. Pneumothorax:  Puncturing of a lung is a possibility, every time a needle is introduced in  the area of the chest or upper back.  Pneumothorax refers to free air around the  collapsed lung(s), inside of the thoracic cavity (chest cavity).  Another two possible  complications related to a similar event would include: Hemothorax and Chylothorax.   These are variations of the Pneumothorax, where instead of air around the collapsed  lung(s), you may have blood or chyle, respectively.       6. Spinal headaches: They may occur with any procedures in the area of the spine.       7. Persistent CSF (Cerebro-Spinal Fluid) leakage: This is a rare problem, but may occur  with prolonged intrathecal or epidural catheters either due to the formation of a fistulous  track or a dural tear.       8.  Nerve damage: By working so close to the spinal cord, there is always a possibility of  nerve damage, which could be as serious as a permanent spinal cord injury with  paralysis.       9. Death:  Although rare, severe deadly allergic reactions known as "Anaphylactic  reaction" can occur to any of the medications used.      10. Worsening of the symptoms:  We can always make thing worse.  What are the chances of something like this happening? Chances of any of this occuring are extremely low.  By statistics, you have more of a chance of getting killed in a motor vehicle accident: while driving to the hospital than any of the above occurring .  Nevertheless, you should be aware that they are possibilities.  In general, it is similar to taking a shower.  Everybody knows that you can slip, hit your head and get killed.  Does that mean that you should not shower again?  Nevertheless always keep in mind that statistics do not mean anything if you happen to be on  the wrong side of them.  Even if a procedure has a 1 (one) in a 1,000,000 (million) chance of going wrong, it you happen to be that one..Also, keep in mind that by statistics, you have more of a chance of having something go wrong when taking medications.  Who should not have this procedure? If you are on a blood thinning medication (e.g. Coumadin, Plavix, see list of "Blood Thinners"), or if you have an active infection going on, you should not have the procedure.  If you are taking any blood thinners, please inform your physician.  How should I prepare for this procedure?  Do not eat or drink anything at least six hours prior to the procedure.  Bring a driver with you .  It cannot be a taxi.  Come accompanied by an adult that can drive you back, and that is strong enough to help you if your legs get weak or numb from the local anesthetic.  Take all of your medicines the morning of the procedure with just enough water to swallow them.  If you have diabetes, make sure that you are scheduled to have your procedure done first thing in the morning, whenever possible.  If you have diabetes, take only half of your insulin dose and notify our nurse that you have done so as soon as you arrive at the clinic.  If you are diabetic, but only take blood sugar pills (oral hypoglycemic), then do not take them on the morning of your procedure.  You may take them after you have had the procedure.  Do not take aspirin or any aspirin-containing medications, at least eleven (11) days prior to the procedure.  They may prolong bleeding.  Wear loose fitting clothing that may be easy to take off and that you would not mind if it got stained with Betadine or blood.  Do not wear any jewelry or perfume  Remove any nail coloring.  It will interfere with some of our monitoring equipment.  NOTE: Remember that this is not meant to be interpreted as a complete list of all possible complications.  Unforeseen problems  may occur.  BLOOD THINNERS The following drugs contain aspirin or other products, which can cause increased bleeding during surgery and should not be taken for 2 weeks prior to and 1 week after surgery.  If you should need take something for relief of minor pain,  you may take acetaminophen which is found in Tylenol,m Datril, Anacin-3 and Panadol. It is not blood thinner. The products listed below are.  Do not take any of the products listed below in addition to any listed on your instruction sheet.  A.P.C or A.P.C with Codeine Codeine Phosphate Capsules #3 Ibuprofen Ridaura  ABC compound Congesprin Imuran rimadil  Advil Cope Indocin Robaxisal  Alka-Seltzer Effervescent Pain Reliever and Antacid Coricidin or Coricidin-D  Indomethacin Rufen  Alka-Seltzer plus Cold Medicine Cosprin Ketoprofen S-A-C Tablets  Anacin Analgesic Tablets or Capsules Coumadin Korlgesic Salflex  Anacin Extra Strength Analgesic tablets or capsules CP-2 Tablets Lanoril Salicylate  Anaprox Cuprimine Capsules Levenox Salocol  Anexsia-D Dalteparin Magan Salsalate  Anodynos Darvon compound Magnesium Salicylate Sine-off  Ansaid Dasin Capsules Magsal Sodium Salicylate  Anturane Depen Capsules Marnal Soma  APF Arthritis pain formula Dewitt's Pills Measurin Stanback  Argesic Dia-Gesic Meclofenamic Sulfinpyrazone  Arthritis Bayer Timed Release Aspirin Diclofenac Meclomen Sulindac  Arthritis pain formula Anacin Dicumarol Medipren Supac  Analgesic (Safety coated) Arthralgen Diffunasal Mefanamic Suprofen  Arthritis Strength Bufferin Dihydrocodeine Mepro Compound Suprol  Arthropan liquid Dopirydamole Methcarbomol with Aspirin Synalgos  ASA tablets/Enseals Disalcid Micrainin Tagament  Ascriptin Doan's Midol Talwin  Ascriptin A/D Dolene Mobidin Tanderil  Ascriptin Extra Strength Dolobid Moblgesic Ticlid  Ascriptin with Codeine Doloprin or Doloprin with Codeine Momentum Tolectin  Asperbuf Duoprin Mono-gesic Trendar  Aspergum  Duradyne Motrin or Motrin IB Triminicin  Aspirin plain, buffered or enteric coated Durasal Myochrisine Trigesic  Aspirin Suppositories Easprin Nalfon Trillsate  Aspirin with Codeine Ecotrin Regular or Extra Strength Naprosyn Uracel  Atromid-S Efficin Naproxen Ursinus  Auranofin Capsules Elmiron Neocylate Vanquish  Axotal Emagrin Norgesic Verin  Azathioprine Empirin or Empirin with Codeine Normiflo Vitamin E  Azolid Emprazil Nuprin Voltaren  Bayer Aspirin plain, buffered or children's or timed BC Tablets or powders Encaprin Orgaran Warfarin Sodium  Buff-a-Comp Enoxaparin Orudis Zorpin  Buff-a-Comp with Codeine Equegesic Os-Cal-Gesic   Buffaprin Excedrin plain, buffered or Extra Strength Oxalid   Bufferin Arthritis Strength Feldene Oxphenbutazone   Bufferin plain or Extra Strength Feldene Capsules Oxycodone with Aspirin   Bufferin with Codeine Fenoprofen Fenoprofen Pabalate or Pabalate-SF   Buffets II Flogesic Panagesic   Buffinol plain or Extra Strength Florinal or Florinal with Codeine Panwarfarin   Buf-Tabs Flurbiprofen Penicillamine   Butalbital Compound Four-way cold tablets Penicillin   Butazolidin Fragmin Pepto-Bismol   Carbenicillin Geminisyn Percodan   Carna Arthritis Reliever Geopen Persantine   Carprofen Gold's salt Persistin   Chloramphenicol Goody's Phenylbutazone   Chloromycetin Haltrain Piroxlcam   Clmetidine heparin Plaquenil   Cllnoril Hyco-pap Ponstel   Clofibrate Hydroxy chloroquine Propoxyphen         Before stopping any of these medications, be sure to consult the physician who ordered them.  Some, such as Coumadin (Warfarin) are ordered to prevent or treat serious conditions such as "deep thrombosis", "pumonary embolisms", and other heart problems.  The amount of time that you may need off of the medication may also vary with the medication and the reason for which you were taking it.  If you are taking any of these medications, please make sure you notify your pain  physician before you undergo any procedures.         Pain Management Discharge Instructions  General Discharge Instructions :  If you need to reach your doctor call: Monday-Friday 8:00 am - 4:00 pm at [phone] or toll free [phone].  After clinic hours [phone] to have operator reach doctor.  Bring all of your  medication bottles to all your appointments in the pain clinic.  To cancel or reschedule your appointment with Pain Management please remember to call 24 hours in advance to avoid a fee.  Refer to the educational materials which you have been given on: General Risks, I had my Procedure. Discharge Instructions, Post Sedation.  Post Procedure Instructions:  The drugs you were given will stay in your system until tomorrow, so for the next 24 hours you should not drive, make any legal decisions or drink any alcoholic beverages.  You may eat anything you prefer, but it is better to start with liquids then soups and crackers, and gradually work up to solid foods.  Please notify your doctor immediately if you have any unusual bleeding, trouble breathing or pain that is not related to your normal pain.  Depending on the type of procedure that was done, some parts of your body may feel week and/or numb.  This usually clears up by tonight or the next day.  Walk with the use of an assistive device or accompanied by an adult for the 24 hours.  You may use ice on the affected area for the first 24 hours.  Put ice in a Ziploc bag and cover with a towel and place against area 15 minutes on 15 minutes off.  You may switch to heat after 24 hours.

## 2014-12-12 ENCOUNTER — Encounter: Payer: Self-pay | Admitting: Family Medicine

## 2014-12-13 MED ORDER — DULOXETINE HCL 60 MG PO CPEP: 60.0000 mg | ORAL_CAPSULE | Freq: Two times a day (BID) | ORAL | Status: DC

## 2014-12-16 ENCOUNTER — Other Ambulatory Visit: Payer: Self-pay | Admitting: Pain Medicine

## 2014-12-16 NOTE — Telephone Encounter (Signed)
Call to [facility] to verify Tramadol dose. Patient reports that she does not need a refill at this time, that the pharmacy only did a partial fill on that prescription. Patient stated that she had enough Tramadol until her next appointment.

## 2014-12-16 NOTE — Telephone Encounter (Signed)
Nurses Please call patient and verify daily dose of tramadol so that we can refill tramadol today

## 2014-12-16 NOTE — Telephone Encounter (Signed)
Nurses Please call patient and document the exact dose of tramadol she is taking so that I can prescribe tramadol today

## 2014-12-16 NOTE — Telephone Encounter (Signed)
Nurses Please document patient's daily dose of tramadol and let me know dose patient is taking so that I can prescribe medication today

## 2014-12-17 ENCOUNTER — Other Ambulatory Visit: Payer: Self-pay | Admitting: Pain Medicine

## 2014-12-17 ENCOUNTER — Telehealth: Payer: Self-pay | Admitting: Pain Medicine

## 2014-12-17 MED ORDER — HYDROCODONE-ACETAMINOPHEN 7.5-325 MG PO TABS: ORAL_TABLET | ORAL | Status: DC

## 2014-12-17 NOTE — Telephone Encounter (Signed)
Both bursa hurting and cramping / has appt on Monday, could you give something for break thru pain ?

## 2014-12-17 NOTE — Telephone Encounter (Signed)
Pain is in "bursa" in both legs. States cannot wait until appt on Monday.

## 2014-12-18 ENCOUNTER — Encounter: Payer: No Typology Code available for payment source | Admitting: Pain Medicine

## 2014-12-20 ENCOUNTER — Ambulatory Visit: Payer: No Typology Code available for payment source | Admitting: Family Medicine

## 2014-12-23 ENCOUNTER — Ambulatory Visit: Payer: No Typology Code available for payment source | Attending: Pain Medicine | Admitting: Pain Medicine

## 2014-12-23 ENCOUNTER — Encounter: Payer: Self-pay | Admitting: Pain Medicine

## 2014-12-23 VITALS — BP 137/71 | HR 93 | Temp 98.2°F | Resp 16 | Ht 67.0 in | Wt 230.0 lb

## 2014-12-23 DIAGNOSIS — M7060 Trochanteric bursitis, unspecified hip: Secondary | ICD-10-CM | POA: Diagnosis not present

## 2014-12-23 DIAGNOSIS — M25559 Pain in unspecified hip: Secondary | ICD-10-CM

## 2014-12-23 DIAGNOSIS — M47818 Spondylosis without myelopathy or radiculopathy, sacral and sacrococcygeal region: Secondary | ICD-10-CM

## 2014-12-23 DIAGNOSIS — M533 Sacrococcygeal disorders, not elsewhere classified: Secondary | ICD-10-CM | POA: Insufficient documentation

## 2014-12-23 DIAGNOSIS — M7062 Trochanteric bursitis, left hip: Secondary | ICD-10-CM

## 2014-12-23 DIAGNOSIS — M5136 Other intervertebral disc degeneration, lumbar region: Secondary | ICD-10-CM

## 2014-12-23 DIAGNOSIS — M461 Sacroiliitis, not elsewhere classified: Secondary | ICD-10-CM | POA: Diagnosis not present

## 2014-12-23 DIAGNOSIS — M79605 Pain in left leg: Secondary | ICD-10-CM | POA: Diagnosis present

## 2014-12-23 DIAGNOSIS — M160 Bilateral primary osteoarthritis of hip: Secondary | ICD-10-CM | POA: Insufficient documentation

## 2014-12-23 DIAGNOSIS — M170 Bilateral primary osteoarthritis of knee: Secondary | ICD-10-CM | POA: Diagnosis not present

## 2014-12-23 DIAGNOSIS — M79604 Pain in right leg: Secondary | ICD-10-CM | POA: Diagnosis present

## 2014-12-23 DIAGNOSIS — M7061 Trochanteric bursitis, right hip: Secondary | ICD-10-CM

## 2014-12-23 DIAGNOSIS — M47816 Spondylosis without myelopathy or radiculopathy, lumbar region: Secondary | ICD-10-CM

## 2014-12-23 DIAGNOSIS — M25569 Pain in unspecified knee: Secondary | ICD-10-CM

## 2014-12-23 DIAGNOSIS — M545 Low back pain: Secondary | ICD-10-CM | POA: Diagnosis present

## 2014-12-23 MED ORDER — TRAMADOL HCL 50 MG PO TABS: ORAL_TABLET | ORAL | Status: DC

## 2014-12-23 NOTE — Progress Notes (Signed)
Safety precautions to be maintained throughout the outpatient stay will include: orient to surroundings, keep bed in low position, maintain call bell within reach at all times, provide assistance with transfer out of bed and ambulation.  

## 2014-12-23 NOTE — Patient Instructions (Addendum)
PLAN  ctSacroiliac (SI) Joint Injection Patient Information  Description: The sacroiliac joint connects the scrum (very low back and tailbone) to the ilium (a pelvic bone which also forms half of the hip joint).  Normally this joint experiences very little motion.  When this joint becomes inflamed or unstable low back and or hip and pelvis pain may result.  Injection of this joint with local anesthetics (numbing medicines) and steroids can provide diagnostic information and reduce pain.  This injection is performed with the aid of x-ray guidance into the tailbone area while you are lying on your stomach.   You may experience an electrical sensation down the leg while this is being done.  You may also experience numbness.  We also may ask if we are reproducing your normal pain during the injection.  Conditions which may be treated SI injection:   Low back, buttock, hip or leg pain  Preparation for the Injection:  1. Do not eat any solid food or dairy products within 6 hours of your appointment.  2. You may drink clear liquids up to 2 hours before appointment.  Clear liquids include water, black coffee, juice or soda.  No milk or cream please. 3. You may take your regular medications, including pain medications with a sip of water before your appointment.  Diabetics should hold regular insulin (if take separately) and take 1/2 normal NPH dose the morning of the procedure.  Carry some sugar containing items with you to your appointment. 4. A driver must accompany you and be prepared to drive you home after your procedure. 5. Bring all of your current medications with you. 6. An IV may be inserted and sedation may be given at the discretion of the physician. 7. A blood pressure cuff, EKG and other monitors will often be applied during the procedure.  Some patients may need to have extra oxygen administered for a short period.  8. You will be asked to provide medical information, including your  allergies, prior to the procedure.  We must know immediately if you are taking blood thinners (like Coumadin/Warfarin) or if you are allergic to IV iodine contrast (dye).  We must know if you could possible be pregnant.  Possible side effects:   Bleeding from needle site  Infection (rare, may require surgery)  Nerve injury (rare)  Numbness & tingling (temporary)  A brief convulsion or seizure  Light-headedness (temporary)  Pain at injection site (several days)  Decreased blood pressure (temporary)  Weakness in the leg (temporary)   Call if you experience:   New onset weakness or numbness of an extremity below the injection site that last more than 8 hours.  Hives or difficulty breathing ( go to the emergency room)  Inflammation or drainage at the injection site  Any new symptoms which are concerning to you  Please note:  Although the local anesthetic injected can often make your back/ hip/ buttock/ leg feel good for several hours after the injections, the pain will likely return.  It takes 3-7 days for steroids to work in the sacroiliac area.  You may not notice any pain relief for at least that one week.  If effective, we will often do a series of three injections spaced 3-6 weeks apart to maximally decrease your pain.  After the initial series, we generally will wait some months before a repeat injection of the same type.  If you have any questions, please call [phone] [facility]  Pain  Management Discharge Instructions  General Discharge Instructions :  If you need to reach your doctor call: Monday-Friday 8:00 am - 4:00 pm at [phone] or toll free [phone].  After clinic hours [phone] to have operator reach doctor.  Bring all of your medication bottles to all your appointments in the pain clinic.  To cancel or reschedule your appointment with Pain Management please remember to call 24 hours in advance to  avoid a fee.  Refer to the educational materials which you have been given on: General Risks, I had my Procedure. Discharge Instructions, Post Sedation.  Post Procedure Instructions:  The drugs you were given will stay in your system until tomorrow, so for the next 24 hours you should not drive, make any legal decisions or drink any alcoholic beverages.  You may eat anything you prefer, but it is better to start with liquids then soups and crackers, and gradually work up to solid foods.  Please notify your doctor immediately if you have any unusual bleeding, trouble breathing or pain that is not related to your normal pain.  Depending on the type of procedure that was done, some parts of your body may feel week and/or numb.  This usually clears up by tonight or the next day.  Walk with the use of an assistive device or accompanied by an adult for the 24 hours.  You may use ice on the affected area for the first 24 hours.  Put ice in a Ziploc bag and cover with a towel and place against area 15 minutes on 15 minutes off.  You may switch to heat after 24 hours.GENERAL RISKS AND COMPLICATIONS  What are the risk, side effects and possible complications? Generally speaking, most procedures are safe.  However, with any procedure there are risks, side effects, and the possibility of complications.  The risks and complications are dependent upon the sites that are lesioned, or the type of nerve block to be performed.  The closer the procedure is to the spine, the more serious the risks are.  Great care is taken when placing the radio frequency needles, block needles or lesioning probes, but sometimes complications can occur. 1. Infection: Any time there is an injection through the skin, there is a risk of infection.  This is why sterile conditions are used for these blocks.  There are four possible types of infection. 1. Localized skin infection. 2. Central Nervous System Infection-This can be in the  form of Meningitis, which can be deadly. 3. Epidural Infections-This can be in the form of an epidural abscess, which can cause pressure inside of the spine, causing compression of the spinal cord with subsequent paralysis. This would require an emergency surgery to decompress, and there are no guarantees that the patient would recover from the paralysis. 4. Discitis-This is an infection of the intervertebral discs.  It occurs in about 1% of discography procedures.  It is difficult to treat and it may lead to surgery.        2. Pain: the needles have to go through skin and soft tissues, will cause soreness.       3. Damage to internal structures:  The nerves to be lesioned may be near blood vessels or    other nerves which can be potentially damaged.       4. Bleeding: Bleeding is more common if the patient is taking blood thinners such as  aspirin, Coumadin, Ticiid, Plavix, etc., or if he/she have some genetic predisposition  such as  hemophilia. Bleeding into the spinal canal can cause compression of the spinal  cord with subsequent paralysis.  This would require an emergency surgery to  decompress and there are no guarantees that the patient would recover from the  paralysis.       5. Pneumothorax:  Puncturing of a lung is a possibility, every time a needle is introduced in  the area of the chest or upper back.  Pneumothorax refers to free air around the  collapsed lung(s), inside of the thoracic cavity (chest cavity).  Another two possible  complications related to a similar event would include: Hemothorax and Chylothorax.   These are variations of the Pneumothorax, where instead of air around the collapsed  lung(s), you may have blood or chyle, respectively.       6. Spinal headaches: They may occur with any procedures in the area of the spine.       7. Persistent CSF (Cerebro-Spinal Fluid) leakage: This is a rare problem, but may occur  with prolonged intrathecal or epidural catheters either due to  the formation of a fistulous  track or a dural tear.       8. Nerve damage: By working so close to the spinal cord, there is always a possibility of  nerve damage, which could be as serious as a permanent spinal cord injury with  paralysis.       9. Death:  Although rare, severe deadly allergic reactions known as "Anaphylactic  reaction" can occur to any of the medications used.      10. Worsening of the symptoms:  We can always make thing worse.  What are the chances of something like this happening? Chances of any of this occuring are extremely low.  By statistics, you have more of a chance of getting killed in a motor vehicle accident: while driving to the hospital than any of the above occurring .  Nevertheless, you should be aware that they are possibilities.  In general, it is similar to taking a shower.  Everybody knows that you can slip, hit your head and get killed.  Does that mean that you should not shower again?  Nevertheless always keep in mind that statistics do not mean anything if you happen to be on the wrong side of them.  Even if a procedure has a 1 (one) in a 1,000,000 (million) chance of going wrong, it you happen to be that one..Also, keep in mind that by statistics, you have more of a chance of having something go wrong when taking medications.  Who should not have this procedure? If you are on a blood thinning medication (e.g. Coumadin, Plavix, see list of "Blood Thinners"), or if you have an active infection going on, you should not have the procedure.  If you are taking any blood thinners, please inform your physician.  How should I prepare for this procedure?  Do not eat or drink anything at least six hours prior to the procedure.  Bring a driver with you .  It cannot be a taxi.  Come accompanied by an adult that can drive you back, and that is strong enough to help you if your legs get weak or numb from the local anesthetic.  Take all of your medicines the morning of  the procedure with just enough water to swallow them.  If you have diabetes, make sure that you are scheduled to have your procedure done first thing in the morning, whenever possible.  If you have diabetes,  take only half of your insulin dose and notify our nurse that you have done so as soon as you arrive at the clinic.  If you are diabetic, but only take blood sugar pills (oral hypoglycemic), then do not take them on the morning of your procedure.  You may take them after you have had the procedure.  Do not take aspirin or any aspirin-containing medications, at least eleven (11) days prior to the procedure.  They may prolong bleeding.  Wear loose fitting clothing that may be easy to take off and that you would not mind if it got stained with Betadine or blood.  Do not wear any jewelry or perfume  Remove any nail coloring.  It will interfere with some of our monitoring equipment.  NOTE: Remember that this is not meant to be interpreted as a complete list of all possible complications.  Unforeseen problems may occur.  BLOOD THINNERS The following drugs contain aspirin or other products, which can cause increased bleeding during surgery and should not be taken for 2 weeks prior to and 1 week after surgery.  If you should need take something for relief of minor pain, you may take acetaminophen which is found in Tylenol,m Datril, Anacin-3 and Panadol. It is not blood thinner. The products listed below are.  Do not take any of the products listed below in addition to any listed on your instruction sheet.  A.P.C or A.P.C with Codeine Codeine Phosphate Capsules #3 Ibuprofen Ridaura  ABC compound Congesprin Imuran rimadil  Advil Cope Indocin Robaxisal  Alka-Seltzer Effervescent Pain Reliever and Antacid Coricidin or Coricidin-D  Indomethacin Rufen  Alka-Seltzer plus Cold Medicine Cosprin Ketoprofen S-A-C Tablets  Anacin Analgesic Tablets or Capsules Coumadin Korlgesic Salflex  Anacin Extra  Strength Analgesic tablets or capsules CP-2 Tablets Lanoril Salicylate  Anaprox Cuprimine Capsules Levenox Salocol  Anexsia-D Dalteparin Magan Salsalate  Anodynos Darvon compound Magnesium Salicylate Sine-off  Ansaid Dasin Capsules Magsal Sodium Salicylate  Anturane Depen Capsules Marnal Soma  APF Arthritis pain formula Dewitt's Pills Measurin Stanback  Argesic Dia-Gesic Meclofenamic Sulfinpyrazone  Arthritis Bayer Timed Release Aspirin Diclofenac Meclomen Sulindac  Arthritis pain formula Anacin Dicumarol Medipren Supac  Analgesic (Safety coated) Arthralgen Diffunasal Mefanamic Suprofen  Arthritis Strength Bufferin Dihydrocodeine Mepro Compound Suprol  Arthropan liquid Dopirydamole Methcarbomol with Aspirin Synalgos  ASA tablets/Enseals Disalcid Micrainin Tagament  Ascriptin Doan's Midol Talwin  Ascriptin A/D Dolene Mobidin Tanderil  Ascriptin Extra Strength Dolobid Moblgesic Ticlid  Ascriptin with Codeine Doloprin or Doloprin with Codeine Momentum Tolectin  Asperbuf Duoprin Mono-gesic Trendar  Aspergum Duradyne Motrin or Motrin IB Triminicin  Aspirin plain, buffered or enteric coated Durasal Myochrisine Trigesic  Aspirin Suppositories Easprin Nalfon Trillsate  Aspirin with Codeine Ecotrin Regular or Extra Strength Naprosyn Uracel  Atromid-S Efficin Naproxen Ursinus  Auranofin Capsules Elmiron Neocylate Vanquish  Axotal Emagrin Norgesic Verin  Azathioprine Empirin or Empirin with Codeine Normiflo Vitamin E  Azolid Emprazil Nuprin Voltaren  Bayer Aspirin plain, buffered or children's or timed BC Tablets or powders Encaprin Orgaran Warfarin Sodium  Buff-a-Comp Enoxaparin Orudis Zorpin  Buff-a-Comp with Codeine Equegesic Os-Cal-Gesic   Buffaprin Excedrin plain, buffered or Extra Strength Oxalid   Bufferin Arthritis Strength Feldene Oxphenbutazone   Bufferin plain or Extra Strength Feldene Capsules Oxycodone with Aspirin   Bufferin with Codeine Fenoprofen Fenoprofen Pabalate or  Pabalate-SF   Buffets II Flogesic Panagesic   Buffinol plain or Extra Strength Florinal or Florinal with Codeine Panwarfarin   Buf-Tabs Flurbiprofen Penicillamine   Butalbital Compound Four-way cold tablets  Penicillin   Butazolidin Fragmin Pepto-Bismol   Carbenicillin Geminisyn Percodan   Carna Arthritis Reliever Geopen Persantine   Carprofen Gold's salt Persistin   Chloramphenicol Goody's Phenylbutazone   Chloromycetin Haltrain Piroxlcam   Clmetidine heparin Plaquenil   Cllnoril Hyco-pap Ponstel   Clofibrate Hydroxy chloroquine Propoxyphen         Before stopping any of these medications, be sure to consult the physician who ordered them.  Some, such as Coumadin (Warfarin) are ordered to prevent or treat serious conditions such as "deep thrombosis", "pumonary embolisms", and other heart problems.  The amount of time that you may need off of the medication may also vary with the medication and the reason for which you were taking it.  If you are taking any of these medications, please make sure you notify your pain physician before you undergo any procedures.          

## 2014-12-23 NOTE — Progress Notes (Signed)
   Subjective:    Patient ID: Bethany Scott, female    DOB: 02/14/1955, 59 y.o.   MRN: [id number]  HPI  The patient is a 59 year old female who returns to pain management for further evaluation and treatment of pain involving the lower back and lower extremity region predominantly. The patient is with pain involving the neck and upper extremity region of lesser degree. At the present time predominant pain involves the lower back with pain radiating from the lower back for the buttocks on the left as well as on the right. Patient also is significant pain involving the greater trochanteric region. We discussed patient's condition and will proceed with block of nerves to the sacroiliac joint at time of return appointment in attempt to decrease severity of patient's symptoms, minimize progression of symptoms, and avoid the need for more involved treatment. The patient is understanding and agreed to suggested treatment plan.     Review of Systems     Objective:   Physical Exam  There was tenderness to palpation of the splenius capitis and a separate talus musculature region a mild degree. There appeared to be unremarkable Spurling's maneuver. Patient appeared to be with bilaterally equal grip strength. Tinel and Phalen's maneuver were without increased pain of significant degree. Palpation over the thoracic facet thoracic paraspinal muscles reproduces evidence of moderate muscle spasms in the lower thoracic region with no crepitus of the thoracic region noted. Palpation over the lumbar paraspinal musculature region lumbar facet region but with moderate to severe discomfort. Lateral bending rotation extension and palpation over the lumbar facets reproduced moderately severe discomfort. There was tenderness to palpation of severe degree of the PSIS and PII S regions. There was severe tenderness to palpation of the greater trochanteric region and iliotibial band region. No definite sensory deficit or  dermatomal distribution detected. There was negative clonus negative Homans. Abdomen nontender with no costovertebral tenderness noted.      Assessment & Plan:    Sacroiliac joint dysfunction   Sacroiliitis  Degenerative joint disease of hips  Degenerative joint disease of knees  Degenerative disc disease of spine  Greater trochanteric bursitis    PLAN  Continue present medication tramadol and hydrocodone acetaminophen  Block of nerves to the sacroiliac joint to be performed at time of return appointment  F/U PCP K Smith for evaliation of  BP and general medical  condition  F/U surgical evaluation. May consider pending follow-up evaluations  F/U neurological evaluation. May consider pending follow-up evaluations  May consider radiofrequency rhizolysis or intraspinal procedures pending response to present treatment and F/U evaluation   Patient to call Pain Management Center should patient have concerns prior to scheduled return appointment.

## 2015-01-06 ENCOUNTER — Other Ambulatory Visit: Payer: Self-pay | Admitting: Family Medicine

## 2015-01-07 ENCOUNTER — Other Ambulatory Visit: Payer: Self-pay | Admitting: Family Medicine

## 2015-01-08 ENCOUNTER — Encounter: Payer: Self-pay | Admitting: Pain Medicine

## 2015-01-08 ENCOUNTER — Ambulatory Visit: Payer: No Typology Code available for payment source | Attending: Pain Medicine | Admitting: Pain Medicine

## 2015-01-08 VITALS — BP 123/59 | HR 79 | Temp 95.6°F | Resp 18 | Ht 67.0 in | Wt 232.0 lb

## 2015-01-08 DIAGNOSIS — M7062 Trochanteric bursitis, left hip: Secondary | ICD-10-CM

## 2015-01-08 DIAGNOSIS — M545 Low back pain: Secondary | ICD-10-CM | POA: Insufficient documentation

## 2015-01-08 DIAGNOSIS — M51369 Other intervertebral disc degeneration, lumbar region without mention of lumbar back pain or lower extremity pain: Secondary | ICD-10-CM

## 2015-01-08 DIAGNOSIS — M461 Sacroiliitis, not elsewhere classified: Secondary | ICD-10-CM

## 2015-01-08 DIAGNOSIS — M160 Bilateral primary osteoarthritis of hip: Secondary | ICD-10-CM

## 2015-01-08 DIAGNOSIS — M5136 Other intervertebral disc degeneration, lumbar region: Secondary | ICD-10-CM | POA: Diagnosis not present

## 2015-01-08 DIAGNOSIS — M47818 Spondylosis without myelopathy or radiculopathy, sacral and sacrococcygeal region: Secondary | ICD-10-CM

## 2015-01-08 DIAGNOSIS — M7061 Trochanteric bursitis, right hip: Secondary | ICD-10-CM

## 2015-01-08 DIAGNOSIS — M25569 Pain in unspecified knee: Secondary | ICD-10-CM

## 2015-01-08 DIAGNOSIS — M47816 Spondylosis without myelopathy or radiculopathy, lumbar region: Secondary | ICD-10-CM

## 2015-01-08 DIAGNOSIS — M170 Bilateral primary osteoarthritis of knee: Secondary | ICD-10-CM

## 2015-01-08 DIAGNOSIS — M25559 Pain in unspecified hip: Secondary | ICD-10-CM

## 2015-01-08 MED ORDER — BUPIVACAINE HCL (PF) 0.25 % IJ SOLN
INTRAMUSCULAR | Status: AC
Start: 2015-01-08 — End: 2015-01-08
  Filled 2015-01-08: qty 30

## 2015-01-08 MED ORDER — ORPHENADRINE CITRATE 30 MG/ML IJ SOLN
INTRAMUSCULAR | Status: AC
  Filled 2015-01-08: qty 2

## 2015-01-08 MED ORDER — FENTANYL CITRATE (PF) 100 MCG/2ML IJ SOLN
INTRAMUSCULAR | Status: AC
  Administered 2015-01-08: 100 ug via INTRAVENOUS
  Filled 2015-01-08: qty 2

## 2015-01-08 MED ORDER — ORPHENADRINE CITRATE 30 MG/ML IJ SOLN
60.0000 mg | Freq: Once | INTRAMUSCULAR | Status: AC
  Administered 2015-01-08: 15:00:00 via INTRAMUSCULAR

## 2015-01-08 MED ORDER — TRIAMCINOLONE ACETONIDE 40 MG/ML IJ SUSP
INTRAMUSCULAR | Status: AC
  Filled 2015-01-08: qty 1

## 2015-01-08 MED ORDER — HYDROCODONE-ACETAMINOPHEN 7.5-325 MG PO TABS: ORAL_TABLET | ORAL | Status: DC

## 2015-01-08 MED ORDER — LACTATED RINGERS IV SOLN: 1000.0000 mL | INTRAVENOUS | Status: DC

## 2015-01-08 MED ORDER — MIDAZOLAM HCL 5 MG/5ML IJ SOLN
INTRAMUSCULAR | Status: AC
Start: 2015-01-08 — End: 2015-01-08
  Administered 2015-01-08: 5 mg via INTRAVENOUS
  Filled 2015-01-08: qty 5

## 2015-01-08 MED ORDER — BUPIVACAINE HCL (PF) 0.25 % IJ SOLN
30.0000 mL | Freq: Once | INTRAMUSCULAR | Status: AC
  Administered 2015-01-08: 15:00:00

## 2015-01-08 MED ORDER — FENTANYL CITRATE (PF) 100 MCG/2ML IJ SOLN
100.0000 ug | Freq: Once | INTRAMUSCULAR | Status: AC
  Administered 2015-01-08: 100 ug via INTRAVENOUS

## 2015-01-08 MED ORDER — TRAMADOL HCL 50 MG PO TABS: ORAL_TABLET | ORAL | Status: DC

## 2015-01-08 MED ORDER — TRIAMCINOLONE ACETONIDE 40 MG/ML IJ SUSP
40.0000 mg | Freq: Once | INTRAMUSCULAR | Status: AC
  Administered 2015-01-08: 15:00:00

## 2015-01-08 MED ORDER — MIDAZOLAM HCL 5 MG/5ML IJ SOLN
5.0000 mg | Freq: Once | INTRAMUSCULAR | Status: AC
  Administered 2015-01-08: 5 mg via INTRAVENOUS

## 2015-01-08 NOTE — Patient Instructions (Addendum)
PLAN  Continue present medications tramadol and hydrocodone acetaminophen  F/U PCP Dr. Tamala Julian for evaliation of  BP and general medical  condition  F/U surgical evaluation  F/U neurological evaluation  May consider radiofrequency rhizolysis or intraspinal procedures pending response to present treatment and F/U evaluation   Patient to call Pain Management Center should patient have concerns prior to scheduled return appointment  Pain Management Discharge Instructions  General Discharge Instructions :  If you need to reach your doctor call: Monday-Friday 8:00 am - 4:00 pm at [phone] or toll free [phone].  After clinic hours [phone] to have operator reach doctor.  Bring all of your medication bottles to all your appointments in the pain clinic.  To cancel or reschedule your appointment with Pain Management please remember to call 24 hours in advance to avoid a fee.  Refer to the educational materials which you have been given on: General Risks, I had my Procedure. Discharge Instructions, Post Sedation.  Post Procedure Instructions:  The drugs you were given will stay in your system until tomorrow, so for the next 24 hours you should not drive, make any legal decisions or drink any alcoholic beverages.  You may eat anything you prefer, but it is better to start with liquids then soups and crackers, and gradually work up to solid foods.  Please notify your doctor immediately if you have any unusual bleeding, trouble breathing or pain that is not related to your normal pain.  Depending on the type of procedure that was done, some parts of your body may feel week and/or numb.  This usually clears up by tonight or the next day.  Walk with the use of an assistive device or accompanied by an adult for the 24 hours.  You may use ice on the affected area for the first 24 hours.  Put ice in a Ziploc bag and cover with a towel and place against area 15 minutes on 15 minutes  off.  You may switch to heat after 24 hours.  A prescription for TRAMADOL, HYDROCODONE was given to you today.

## 2015-01-08 NOTE — Progress Notes (Signed)
Subjective:    Patient ID: Bethany Scott, female    DOB: 1955/09/22, 59 y.o.   MRN: [id number]  HPI  PROCEDURE:  Block of nerves to the sacroiliac joint.   NOTE:  The patient is a 59 y.o. female who [facility] for further evaluation and treatment of pain involving the lower back and lower extremity region with pain in the region of the buttocks as well. MRI studies reveal   moderate to severe L4-L5 and L5-S1 facet degeneration. 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 We will proceed with what is felt to be a medically necessary procedure, block of nerves to the sacroiliac joint.   DESCRIPTION OF PROCEDURE:  Block of nerves to the sacroiliac joint.   The patient was taken to the fluoroscopy suite. With the patient in the prone position with EKG, blood pressure, pulse and pulse oximetry monitoring, IV Versed, IV fentanyl conscious sedation, Betadine prep of proposed entry site was performed.   Block of nerves at the L5 vertebral body level.   With the patient in prone position, under fluoroscopic guidance, a 22 -gauge needle was inserted at the L5 vertebral body level on the left side. With 15 degrees oblique orientation a 22 -gauge needle was inserted in the region known as Burton's eye or eye of the Scotty dog. Following documentation of needle placement in the area of Burton's eye or eye of the Scotty dog under fluoroscopic guidance, needle placement was then accomplished  at the sacral ala level on the left side.   Needle placement at the sacral ala.   With the patient in prone position under fluoroscopic guidance with AP view of the lumbosacral spine, a 22 -gauge needle was inserted in the region known as the sacral ala on the left side. Following documentation of needle placement on the left side under fluoroscopic guidance needle placement was then accomplished at the S1 foramen level.   Needle placement at the S1 foramen level.   With the patient in prone position under fluoroscopic guidance with AP view of the lumbosacral spine and cephalad orientation, a 22 -gauge needle was inserted at the superior and lateral border of the S1 foramen on the left side. Following documentation of needle placement at the S1 foramen level on the left side, needle placement was then accomplished at the S2 foramen level on the left side.   Needle placement at the S2 foramen level.   With the patient in prone position with AP view of the lumbosacral spine with cephalad orientation, a 22 - gauge needle was inserted at the superior and lateral border of the S2 foramen under fluoroscopic guidance on the left side. Following needle placement at the L5 vertebral body level, sacral ala, S1 foramen and S2 foramen on the left left side, needle placement was verified on lateral view under fluoroscopic guidance.  Following needle placement documentation on lateral view, each needle was injected with 1 mL of 0.25% bupivacaine and Kenalog.   BLOCK OF THE NERVES TO SACROILIAC JOINT ON  THE RIGHT SIDE The procedure was performed on the right side at the same levels as was performed on the left side and utilizing the same technique as on the left side and was performed under fluoroscopic guidance as on the left side   Myoneural block injection of the gluteal musculature region  Following Betadine prep of proposed entry site a 22-gauge needle was inserted in the gluteal musculature region on the  left a total of 4 cc of 0.25% bupivacaine with Toradol and Norflex was injected for myoneural block injection of the gluteal musculature region times to  The patient tolerated procedure well  A total of 10mg  of Kenalog was utilized for the procedure.   PLAN:  1. Medications: The patient will continue presently prescribed medications tramadol and hydrocodone acetaminophen 2. The patient will be considered for modification of treatment regimen pending response to the procedure performed on today's visit.  3. The patient is to follow-up with primary care physician Dr. Zannie Cove for evaluation of blood pressure and general medical condition following the procedure performed on today's visit.  4. Surgical evaluation as discussed. Has been addressed.  5. Neurological evaluation as discussed. May consider [facility] EMG studies and other studies 6. The patient may be a candidate for radiofrequency procedures, implantation devices and other treatment pending response to treatment performed on today's visit and follow-up evaluation.  7. The patient has been advised to adhere to proper body mechanics and to avoid activities which may exacerbate the patient's symptoms.   Return appointment to [facility] as scheduled.    Review of Systems     Objective:   Physical Exam        Assessment & Plan:

## 2015-01-08 NOTE — Progress Notes (Signed)
Safety precautions to be maintained throughout the outpatient stay will include: orient to surroundings, keep bed in low position, maintain call bell within reach at all times, provide assistance with transfer out of bed and ambulation.  

## 2015-01-09 ENCOUNTER — Telehealth: Payer: Self-pay | Admitting: *Deleted

## 2015-01-09 NOTE — Telephone Encounter (Signed)
Spoke with patient re; procedure on yesterday.  Denies any complications or concerns from procedure.

## 2015-01-17 ENCOUNTER — Ambulatory Visit: Payer: No Typology Code available for payment source | Admitting: Family Medicine

## 2015-02-03 ENCOUNTER — Other Ambulatory Visit: Payer: Self-pay | Admitting: Family Medicine

## 2015-02-04 ENCOUNTER — Other Ambulatory Visit: Payer: Self-pay | Admitting: Family Medicine

## 2015-02-05 ENCOUNTER — Encounter: Payer: Self-pay | Admitting: Family Medicine

## 2015-02-05 NOTE — Telephone Encounter (Signed)
Do you want to refill phenegren

## 2015-02-06 ENCOUNTER — Encounter: Payer: Self-pay | Admitting: Pain Medicine

## 2015-02-06 ENCOUNTER — Ambulatory Visit: Payer: No Typology Code available for payment source | Attending: Pain Medicine | Admitting: Pain Medicine

## 2015-02-06 VITALS — BP 129/83 | HR 97 | Temp 98.1°F | Resp 16 | Ht 67.0 in | Wt 232.0 lb

## 2015-02-06 DIAGNOSIS — M545 Low back pain: Secondary | ICD-10-CM | POA: Diagnosis present

## 2015-02-06 DIAGNOSIS — M79606 Pain in leg, unspecified: Secondary | ICD-10-CM | POA: Diagnosis present

## 2015-02-06 DIAGNOSIS — M791 Myalgia: Secondary | ICD-10-CM | POA: Diagnosis not present

## 2015-02-06 DIAGNOSIS — M5136 Other intervertebral disc degeneration, lumbar region: Secondary | ICD-10-CM

## 2015-02-06 DIAGNOSIS — M5416 Radiculopathy, lumbar region: Secondary | ICD-10-CM | POA: Diagnosis not present

## 2015-02-06 DIAGNOSIS — M47816 Spondylosis without myelopathy or radiculopathy, lumbar region: Secondary | ICD-10-CM

## 2015-02-06 DIAGNOSIS — M47817 Spondylosis without myelopathy or radiculopathy, lumbosacral region: Secondary | ICD-10-CM | POA: Diagnosis not present

## 2015-02-06 DIAGNOSIS — M7062 Trochanteric bursitis, left hip: Secondary | ICD-10-CM

## 2015-02-06 DIAGNOSIS — M7061 Trochanteric bursitis, right hip: Secondary | ICD-10-CM

## 2015-02-06 DIAGNOSIS — M25559 Pain in unspecified hip: Secondary | ICD-10-CM

## 2015-02-06 DIAGNOSIS — M170 Bilateral primary osteoarthritis of knee: Secondary | ICD-10-CM | POA: Insufficient documentation

## 2015-02-06 DIAGNOSIS — M533 Sacrococcygeal disorders, not elsewhere classified: Secondary | ICD-10-CM | POA: Diagnosis not present

## 2015-02-06 DIAGNOSIS — M160 Bilateral primary osteoarthritis of hip: Secondary | ICD-10-CM | POA: Insufficient documentation

## 2015-02-06 DIAGNOSIS — M47818 Spondylosis without myelopathy or radiculopathy, sacral and sacrococcygeal region: Secondary | ICD-10-CM

## 2015-02-06 DIAGNOSIS — M461 Sacroiliitis, not elsewhere classified: Secondary | ICD-10-CM | POA: Diagnosis not present

## 2015-02-06 DIAGNOSIS — M7060 Trochanteric bursitis, unspecified hip: Secondary | ICD-10-CM | POA: Diagnosis not present

## 2015-02-06 DIAGNOSIS — M25569 Pain in unspecified knee: Secondary | ICD-10-CM

## 2015-02-06 MED ORDER — TRAMADOL HCL 50 MG PO TABS: ORAL_TABLET | ORAL | Status: DC

## 2015-02-06 MED ORDER — HYDROCODONE-ACETAMINOPHEN 7.5-325 MG PO TABS: ORAL_TABLET | ORAL | Status: DC

## 2015-02-06 NOTE — Patient Instructions (Addendum)
PLAN  Continue present medications tramadol and hydrocodone acetaminophen IMPORTANT   NO VOLTAREN IF YOU TAKE MOBIC AND NO IBUPROFEN OR SIMILAR MEDICATIONs  Will consider block of nerves to sacroiliac joint as discussed  F/U PCP Dr. Tamala Julian for evaliation of  BP and general medical  condition  F/U surgical evaluation. May be considered for further evaluation as discussed  F/U neurological evaluation. May be considered for further evaluation as discussed M  May consider radiofrequency rhizolysis or intraspinal procedures pending response to present treatment and F/U evaluation   Patient to call [facility] should patient have concerns prior to scheduled return appointmentPain Management Discharge Instructions  General Discharge Instructions :  If you need to reach your doctor call: Monday-Friday 8:00 am - 4:00 pm at [phone] or toll free [phone].  After clinic hours [phone] to have operator reach doctor.  Bring all of your medication bottles to all your appointments in the pain clinic.  To cancel or reschedule your appointment with Pain Management please remember to call 24 hours in advance to avoid a fee.  Refer to the educational materials which you have been given on: General Risks, I had my Procedure. Discharge Instructions, Post Sedation.  Post Procedure Instructions:  The drugs you were given will stay in your system until tomorrow, so for the next 24 hours you should not drive, make any legal decisions or drink any alcoholic beverages.  You may eat anything you prefer, but it is better to start with liquids then soups and crackers, and gradually work up to solid foods.  Please notify your doctor immediately if you have any unusual bleeding, trouble breathing or pain that is not related to your normal pain.  Depending on the type of procedure that was done, some parts of your body may feel week and/or numb.  This usually clears up by tonight or the next  day.  Walk with the use of an assistive device or accompanied by an adult for the 24 hours.  You may use ice on the affected area for the first 24 hours.  Put ice in a Ziploc bag and cover with a towel and place against area 15 minutes on 15 minutes off.  You may switch to heat after 24 hours.Sacroiliac (SI) Joint Injection Patient Information  Description: The sacroiliac joint connects the scrum (very low back and tailbone) to the ilium (a pelvic bone which also forms half of the hip joint).  Normally this joint experiences very little motion.  When this joint becomes inflamed or unstable low back and or hip and pelvis pain may result.  Injection of this joint with local anesthetics (numbing medicines) and steroids can provide diagnostic information and reduce pain.  This injection is performed with the aid of x-ray guidance into the tailbone area while you are lying on your stomach.   You may experience an electrical sensation down the leg while this is being done.  You may also experience numbness.  We also may ask if we are reproducing your normal pain during the injection.  Conditions which may be treated SI injection:  Low back, buttock, hip or leg pain  Preparation for the Injection:  Do not eat any solid food or dairy products within 6 hours of your appointment.  You may drink clear liquids up to 2 hours before appointment.  Clear liquids include water, black coffee, juice or soda.  No milk or cream please. You may take your regular medications, including pain medications with a sip  of water before your appointment.  Diabetics should hold regular insulin (if take separately) and take 1/2 normal NPH dose the morning of the procedure.  Carry some sugar containing items with you to your appointment. A driver must accompany you and be prepared to drive you home after your procedure. Bring all of your current medications with you. An IV may be inserted and sedation may be given at the  discretion of the physician. A blood pressure cuff, EKG and other monitors will often be applied during the procedure.  Some patients may need to have extra oxygen administered for a short period.  You will be asked to provide medical information, including your allergies, prior to the procedure.  We must know immediately if you are taking blood thinners (like Coumadin/Warfarin) or if you are allergic to IV iodine contrast (dye).  We must know if you could possible be pregnant.  Possible side effects:  Bleeding from needle site Infection (rare, may require surgery) Nerve injury (rare) Numbness & tingling (temporary) A brief convulsion or seizure Light-headedness (temporary) Pain at injection site (several days) Decreased blood pressure (temporary) Weakness in the leg (temporary)   Call if you experience:  New onset weakness or numbness of an extremity below the injection site that last more than 8 hours. Hives or difficulty breathing ( go to the emergency room) Inflammation or drainage at the injection site Any new symptoms which are concerning to you  Please note:  Although the local anesthetic injected can often make your back/ hip/ buttock/ leg feel good for several hours after the injections, the pain will likely return.  It takes 3-7 days for steroids to work in the sacroiliac area.  You may not notice any pain relief for at least that one week.  If effective, we will often do a series of three injections spaced 3-6 weeks apart to maximally decrease your pain.  After the initial series, we generally will wait some months before a repeat injection of the same type.  If you have any questions, please call [phone] Anita  What are the risk, side effects and possible complications? Generally speaking, most procedures are safe.  However, with any procedure there are risks, side effects, and the  possibility of complications.  The risks and complications are dependent upon the sites that are lesioned, or the type of nerve block to be performed.  The closer the procedure is to the spine, the more serious the risks are.  Great care is taken when placing the radio frequency needles, block needles or lesioning probes, but sometimes complications can occur. 1. Infection: Any time there is an injection through the skin, there is a risk of infection.  This is why sterile conditions are used for these blocks.  There are four possible types of infection. 1. Localized skin infection. 2. Central Nervous System Infection-This can be in the form of Meningitis, which can be deadly. 3. Epidural Infections-This can be in the form of an epidural abscess, which can cause pressure inside of the spine, causing compression of the spinal cord with subsequent paralysis. This would require an emergency surgery to decompress, and there are no guarantees that the patient would recover from the paralysis. 4. Discitis-This is an infection of the intervertebral discs.  It occurs in about 1% of discography procedures.  It is difficult to treat and it may lead to surgery.        2. Pain:  the needles have to go through skin and soft tissues, will cause soreness.       3. Damage to internal structures:  The nerves to be lesioned may be near blood vessels or    other nerves which can be potentially damaged.       4. Bleeding: Bleeding is more common if the patient is taking blood thinners such as  aspirin, Coumadin, Ticiid, Plavix, etc., or if he/she have some genetic predisposition  such as hemophilia. Bleeding into the spinal canal can cause compression of the spinal  cord with subsequent paralysis.  This would require an emergency surgery to  decompress and there are no guarantees that the patient would recover from the  paralysis.       5. Pneumothorax:  Puncturing of a lung is a possibility, every time a needle is introduced  in  the area of the chest or upper back.  Pneumothorax refers to free air around the  collapsed lung(s), inside of the thoracic cavity (chest cavity).  Another two possible  complications related to a similar event would include: Hemothorax and Chylothorax.   These are variations of the Pneumothorax, where instead of air around the collapsed  lung(s), you may have blood or chyle, respectively.       6. Spinal headaches: They may occur with any procedures in the area of the spine.       7. Persistent CSF (Cerebro-Spinal Fluid) leakage: This is a rare problem, but may occur  with prolonged intrathecal or epidural catheters either due to the formation of a fistulous  track or a dural tear.       8. Nerve damage: By working so close to the spinal cord, there is always a possibility of  nerve damage, which could be as serious as a permanent spinal cord injury with  paralysis.       9. Death:  Although rare, severe deadly allergic reactions known as "Anaphylactic  reaction" can occur to any of the medications used.      10. Worsening of the symptoms:  We can always make thing worse.  What are the chances of something like this happening? Chances of any of this occuring are extremely low.  By statistics, you have more of a chance of getting killed in a motor vehicle accident: while driving to the hospital than any of the above occurring .  Nevertheless, you should be aware that they are possibilities.  In general, it is similar to taking a shower.  Everybody knows that you can slip, hit your head and get killed.  Does that mean that you should not shower again?  Nevertheless always keep in mind that statistics do not mean anything if you happen to be on the wrong side of them.  Even if a procedure has a 1 (one) in a 1,000,000 (million) chance of going wrong, it you happen to be that one..Also, keep in mind that by statistics, you have more of a chance of having something go wrong when taking medications.  Who  should not have this procedure? If you are on a blood thinning medication (e.g. Coumadin, Plavix, see list of "Blood Thinners"), or if you have an active infection going on, you should not have the procedure.  If you are taking any blood thinners, please inform your physician.  How should I prepare for this procedure?  Do not eat or drink anything at least six hours prior to the procedure.  Bring a driver with you .  It cannot be a taxi.  Come accompanied by an adult that can drive you back, and that is strong enough to help you if your legs get weak or numb from the local anesthetic.  Take all of your medicines the morning of the procedure with just enough water to swallow them.  If you have diabetes, make sure that you are scheduled to have your procedure done first thing in the morning, whenever possible.  If you have diabetes, take only half of your insulin dose and notify our nurse that you have done so as soon as you arrive at the clinic.  If you are diabetic, but only take blood sugar pills (oral hypoglycemic), then do not take them on the morning of your procedure.  You may take them after you have had the procedure.  Do not take aspirin or any aspirin-containing medications, at least eleven (11) days prior to the procedure.  They may prolong bleeding.  Wear loose fitting clothing that may be easy to take off and that you would not mind if it got stained with Betadine or blood.  Do not wear any jewelry or perfume  Remove any nail coloring.  It will interfere with some of our monitoring equipment.  NOTE: Remember that this is not meant to be interpreted as a complete list of all possible complications.  Unforeseen problems may occur.  BLOOD THINNERS The following drugs contain aspirin or other products, which can cause increased bleeding during surgery and should not be taken for 2 weeks prior to and 1 week after surgery.  If you should need take something for relief of minor  pain, you may take acetaminophen which is found in Tylenol,m Datril, Anacin-3 and Panadol. It is not blood thinner. The products listed below are.  Do not take any of the products listed below in addition to any listed on your instruction sheet.  A.P.C or A.P.C with Codeine Codeine Phosphate Capsules #3 Ibuprofen Ridaura  ABC compound Congesprin Imuran rimadil  Advil Cope Indocin Robaxisal  Alka-Seltzer Effervescent Pain Reliever and Antacid Coricidin or Coricidin-D  Indomethacin Rufen  Alka-Seltzer plus Cold Medicine Cosprin Ketoprofen S-A-C Tablets  Anacin Analgesic Tablets or Capsules Coumadin Korlgesic Salflex  Anacin Extra Strength Analgesic tablets or capsules CP-2 Tablets Lanoril Salicylate  Anaprox Cuprimine Capsules Levenox Salocol  Anexsia-D Dalteparin Magan Salsalate  Anodynos Darvon compound Magnesium Salicylate Sine-off  Ansaid Dasin Capsules Magsal Sodium Salicylate  Anturane Depen Capsules Marnal Soma  APF Arthritis pain formula Dewitt's Pills Measurin Stanback  Argesic Dia-Gesic Meclofenamic Sulfinpyrazone  Arthritis Bayer Timed Release Aspirin Diclofenac Meclomen Sulindac  Arthritis pain formula Anacin Dicumarol Medipren Supac  Analgesic (Safety coated) Arthralgen Diffunasal Mefanamic Suprofen  Arthritis Strength Bufferin Dihydrocodeine Mepro Compound Suprol  Arthropan liquid Dopirydamole Methcarbomol with Aspirin Synalgos  ASA tablets/Enseals Disalcid Micrainin Tagament  Ascriptin Doan's Midol Talwin  Ascriptin A/D Dolene Mobidin Tanderil  Ascriptin Extra Strength Dolobid Moblgesic Ticlid  Ascriptin with Codeine Doloprin or Doloprin with Codeine Momentum Tolectin  Asperbuf Duoprin Mono-gesic Trendar  Aspergum Duradyne Motrin or Motrin IB Triminicin  Aspirin plain, buffered or enteric coated Durasal Myochrisine Trigesic  Aspirin Suppositories Easprin Nalfon Trillsate  Aspirin with Codeine Ecotrin Regular or Extra Strength Naprosyn Uracel  Atromid-S Efficin Naproxen  Ursinus  Auranofin Capsules Elmiron Neocylate Vanquish  Axotal Emagrin Norgesic Verin  Azathioprine Empirin or Empirin with Codeine Normiflo Vitamin E  Azolid Emprazil Nuprin Voltaren  Bayer Aspirin plain, buffered or children's or timed BC Tablets or powders Encaprin Orgaran Warfarin Sodium  Buff-a-Comp Enoxaparin Orudis Zorpin  Buff-a-Comp with Codeine Equegesic Os-Cal-Gesic   Buffaprin Excedrin plain, buffered or Extra Strength Oxalid   Bufferin Arthritis Strength Feldene Oxphenbutazone   Bufferin plain or Extra Strength Feldene Capsules Oxycodone with Aspirin   Bufferin with Codeine Fenoprofen Fenoprofen Pabalate or Pabalate-SF   Buffets II Flogesic Panagesic   Buffinol plain or Extra Strength Florinal or Florinal with Codeine Panwarfarin   Buf-Tabs Flurbiprofen Penicillamine   Butalbital Compound Four-way cold tablets Penicillin   Butazolidin Fragmin Pepto-Bismol   Carbenicillin Geminisyn Percodan   Carna Arthritis Reliever Geopen Persantine   Carprofen Gold's salt Persistin   Chloramphenicol Goody's Phenylbutazone   Chloromycetin Haltrain Piroxlcam   Clmetidine heparin Plaquenil   Cllnoril Hyco-pap Ponstel   Clofibrate Hydroxy chloroquine Propoxyphen         Before stopping any of these medications, be sure to consult the physician who ordered them.  Some, such as Coumadin (Warfarin) are ordered to prevent or treat serious conditions such as "deep thrombosis", "pumonary embolisms", and other heart problems.  The amount of time that you may need off of the medication may also vary with the medication and the reason for which you were taking it.  If you are taking any of these medications, please make sure you notify your pain physician before you undergo any procedures.

## 2015-02-06 NOTE — Progress Notes (Signed)
Safety precautions to be maintained throughout the outpatient stay will include: orient to surroundings, keep bed in low position, maintain call bell within reach at all times, provide assistance with transfer out of bed and ambulation.  

## 2015-02-06 NOTE — Telephone Encounter (Signed)
PA approved for duloxetine 60 mg, 2 per day through 02/06/16. Notified pt on MyChart.

## 2015-02-06 NOTE — Progress Notes (Signed)
Subjective:    Patient ID: Bethany Scott, female    DOB: [birth year], 60 y.o.   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 We discussed patient's overall condition and will continue medications as prescribed this time. We will remain available to consider patient for additional interventional treatment as discussed and as explained to patient on today's visit. The patient will continue tramadol and hydrocodone acetaminophen and will call pain management prior to scheduled return appointment should there be need to consider change in present treatment regimen as well as the to consider additional interventional treatment. The patient denies trauma change in events of daily living the call significant change in symptomatology. The patient was in agreement with suggested treatment plan. Continue medications as prescribed and will call pain management should there be concerns prior to scheduled return appointment. All agreed with suggested treatment plan   Review of Systems     Objective:   Physical Exam  There was tenderness to palpation of paraspinal muscular treat the cervical and cervical facet region a mild degree with mild tenderness to palpation of the splenius capitis and a separate talus musculature regions. Palpation of the acromioclavicular and glenohumeral joint regions reproduce mild discomfort. The patient appeared to be with bilaterally equal grip strength. Tinel and Phalen's maneuver were without increased pain of significant degree. Palpation over the region of the cervical facet cervical paraspinal must reason reproduced mild discomfort on the left as well as on the right. Palpation  over the splenius capitis and occipitalis musculature regions reproduce mild discomfort. Palpation over the thoracic facet thoracic paraspinal must reason reproduced mild to moderate discomfort especially in the lower thoracic region. Was evidence of moderate muscle spasm of the lower thoracic muscle. No crepitus of the thoracic region was noted. Palpation over the lumbar paraspinal must reason lumbar facet region was attends to palpation with lateral bending rotation extension and palpation of the lumbar facets reproducing moderate discomfort. There was moderate tenderness to palpation over the PSIS and PII S region as well as the gluteal and piriformis musculature region. There was mild to moderate increased pain with pressure applied to the ileum with patient in lateral decubitus position. Palpation of the PSIS and PII S region reproduced moderate discomfort and disposition. Straight leg raising was limited to approximately 30 without increased pain with dorsiflexion noted. There was mild tenderness of the greater trochanteric region iliotibial band region. No sensory deficit or dermatomal distribution detected. There was negative clonus negative Homans. Abdomen nontender and no costovertebral tenderness noted.    Assessment & Plan:     Sacroiliac joint dysfunction   Sacroiliitis  Degenerative joint disease of hips  Degenerative joint disease of knees  Degenerative disc disease of spine  Greater trochanteric bursitis   PLAN  Continue present medications tramadol and hydrocodone acetaminophen IMPORTANT   NO VOLTAREN IF YOU TAKE MOBIC AND NO IBUPROFEN OR SIMILAR MEDICATIONs  Will consider block of nerves to sacroiliac joint as discussed  F/U PCP Dr. Tamala Julian for evaliation of  BP and general medical  condition  F/U surgical evaluation. May be considered for further evaluation as discussed  F/U neurological evaluation. May be considered for further evaluation as discussed M  May consider  radiofrequency rhizolysis or intraspinal procedures pending response to present treatment and F/U evaluation   Patient to call Pain Management Center should patient have concerns prior to scheduled return appointmentPain Management

## 2015-02-06 NOTE — Telephone Encounter (Signed)
Completed PA on covermymeds. Pending. Notified pt of status.

## 2015-02-19 ENCOUNTER — Telehealth: Payer: Self-pay

## 2015-02-19 NOTE — Telephone Encounter (Signed)
Patient called and r/s her appt. For 2-7  For 2-13 due to being out of town.

## 2015-02-28 ENCOUNTER — Ambulatory Visit: Payer: No Typology Code available for payment source | Admitting: Family Medicine

## 2015-03-04 ENCOUNTER — Encounter: Payer: No Typology Code available for payment source | Admitting: Pain Medicine

## 2015-03-04 ENCOUNTER — Ambulatory Visit: Payer: No Typology Code available for payment source | Admitting: Family Medicine

## 2015-03-07 ENCOUNTER — Other Ambulatory Visit: Payer: Self-pay | Admitting: Family Medicine

## 2015-03-10 ENCOUNTER — Other Ambulatory Visit: Payer: Self-pay | Admitting: Family Medicine

## 2015-03-10 ENCOUNTER — Ambulatory Visit: Payer: No Typology Code available for payment source | Attending: Pain Medicine | Admitting: Pain Medicine

## 2015-03-10 ENCOUNTER — Encounter: Payer: Self-pay | Admitting: Pain Medicine

## 2015-03-10 VITALS — BP 122/73 | HR 98 | Temp 98.1°F | Resp 15 | Ht 67.0 in | Wt 232.0 lb

## 2015-03-10 DIAGNOSIS — M160 Bilateral primary osteoarthritis of hip: Secondary | ICD-10-CM | POA: Diagnosis not present

## 2015-03-10 DIAGNOSIS — M79604 Pain in right leg: Secondary | ICD-10-CM | POA: Diagnosis present

## 2015-03-10 DIAGNOSIS — M7061 Trochanteric bursitis, right hip: Secondary | ICD-10-CM

## 2015-03-10 DIAGNOSIS — M461 Sacroiliitis, not elsewhere classified: Secondary | ICD-10-CM | POA: Insufficient documentation

## 2015-03-10 DIAGNOSIS — M170 Bilateral primary osteoarthritis of knee: Secondary | ICD-10-CM | POA: Insufficient documentation

## 2015-03-10 DIAGNOSIS — M533 Sacrococcygeal disorders, not elsewhere classified: Secondary | ICD-10-CM | POA: Insufficient documentation

## 2015-03-10 DIAGNOSIS — M5416 Radiculopathy, lumbar region: Secondary | ICD-10-CM | POA: Diagnosis not present

## 2015-03-10 DIAGNOSIS — M47816 Spondylosis without myelopathy or radiculopathy, lumbar region: Secondary | ICD-10-CM

## 2015-03-10 DIAGNOSIS — M47818 Spondylosis without myelopathy or radiculopathy, sacral and sacrococcygeal region: Secondary | ICD-10-CM

## 2015-03-10 DIAGNOSIS — M25569 Pain in unspecified knee: Secondary | ICD-10-CM

## 2015-03-10 DIAGNOSIS — M5136 Other intervertebral disc degeneration, lumbar region: Secondary | ICD-10-CM | POA: Insufficient documentation

## 2015-03-10 DIAGNOSIS — M7060 Trochanteric bursitis, unspecified hip: Secondary | ICD-10-CM | POA: Diagnosis not present

## 2015-03-10 DIAGNOSIS — M47817 Spondylosis without myelopathy or radiculopathy, lumbosacral region: Secondary | ICD-10-CM | POA: Diagnosis not present

## 2015-03-10 DIAGNOSIS — M7062 Trochanteric bursitis, left hip: Secondary | ICD-10-CM

## 2015-03-10 DIAGNOSIS — M79605 Pain in left leg: Secondary | ICD-10-CM | POA: Diagnosis present

## 2015-03-10 DIAGNOSIS — M545 Low back pain: Secondary | ICD-10-CM | POA: Diagnosis present

## 2015-03-10 DIAGNOSIS — M791 Myalgia: Secondary | ICD-10-CM | POA: Diagnosis not present

## 2015-03-10 DIAGNOSIS — M25559 Pain in unspecified hip: Secondary | ICD-10-CM

## 2015-03-10 MED ORDER — TRAMADOL HCL 50 MG PO TABS: ORAL_TABLET | ORAL | Status: DC

## 2015-03-10 MED ORDER — HYDROCODONE-ACETAMINOPHEN 7.5-325 MG PO TABS: ORAL_TABLET | ORAL | Status: DC

## 2015-03-10 NOTE — Progress Notes (Signed)
   Subjective:    Patient ID: Bethany Scott, female    DOB: Aug 03, 1955, 60 y.o.   MRN: [id number]  HPI  The patient is a 60 year old female who returns to pain management for further evaluation and treatment of pain involving the lower back and lower extremity regions predominantly The patient states that she has pain aggravated by standing walking twisting turning maneuvers with pain occurring across the back radiating to the buttocks on the left as well as on the right. The patient continues, and all and hydrocodone acetaminophen seems to have significant pain that interferes with all activities of daily living. The patient has some pain involving the knee as well. Discussed patient's condition and will consider patient for lumbar facet, medial branch nerve, blocks at time return appointment and will consider additional modifications of treatment pending response to treatment and follow-up evaluation. The patient was in agreement to suggested treatment plan patient denies trauma change in events of daily living the cost change in symptomatology       Review of Systems     Objective:   Physical Exam  There was tenderness to palpation of the splenius capitis and occipitalis region a mild degree with mild tenderness over the cervical facet cervical paraspinal musculature region. Palpation of the acromioclavicular and glenohumeral joint regions reproduce mild discomfort. Palpation over the thoracic facet thoracic paraspinal musculature region was with mild discomfort with moderate muscle spasms of the lower thoracic region noted. The patient was with bilaterally equal grip strength and Tinel and Phalen's maneuver reproducing minimal discomfort. Palpation over the lumbar paraspinal muscular treat and lumbar facet region was with moderately severe discomfort with lateral bending rotation extension and palpation of the lumbar facets reproducing moderately severe discomfort. Straight leg raising was  tolerates approximately 20 without increased pain with dorsiflexion noted. Knees were without increased warmth and erythema with crepitus of the knees with negative anterior and posterior drawer signs and no ballottement of the patella was noted. No definite sensory deficit or dermatomal distribution detected. There was negative clonus negative Homans. Palpation over the lumbar facet lumbar paraspinal musculature region reproduced severe pain with lateral bending rotation extension and palpation of the lumbar facets reproduced in severely disabling pain There was negative clonus negative Homans. Abdomen nontender with no costovertebral tenderness noted      Assessment & Plan:   Degenerative disc disease lumbar spine  Lumbar facet syndrome  Sacroiliac joint dysfunction   Sacroiliitis  Degenerative joint disease of hips  Degenerative joint disease of knees  Degenerative disc disease of spine  Greater trochanteric bursitis    PLAN  Continue present medications tramadol and hydrocodone acetaminophen IMPORTANT   NO VOLTAREN IF YOU TAKE MOBIC AND NO IBUPROFEN OR SIMILAR MEDICATIONS  Lumbar facet, medial branch nerve, blocks to be performed at time return appointment  F/U PCP Dr. Tamala Julian for evaliation of  BP and general medical  condition  F/U surgical evaluation. May be considered for further evaluation as discussed  F/U neurological evaluation. May be considered for further evaluation as discussed M  May consider radiofrequency rhizolysis or intraspinal procedures pending response to present treatment and F/U evaluation   Patient to call Pain Management Center should patient have concerns prior to scheduled return appointment

## 2015-03-10 NOTE — Progress Notes (Signed)
Safety precautions to be maintained throughout the outpatient stay will include: orient to surroundings, keep bed in low position, maintain call bell within reach at all times, provide assistance with transfer out of bed and ambulation.  

## 2015-03-10 NOTE — Patient Instructions (Addendum)
PLAN  Continue present medications tramadol and hydrocodone acetaminophen IMPORTANT   NO VOLTAREN IF YOU TAKE MOBIC AND NO IBUPROFEN OR SIMILAR MEDICATIONS  Lumbar facet, medial branch nerve, blocks to be performed at time return appointment  F/U PCP Dr. Tamala Julian for evaliation of  BP and general medical  condition  F/U surgical evaluation. May be considered for further evaluation as discussed  F/U neurological evaluation. May be considered for further evaluation as discussed M  May consider radiofrequency rhizolysis or intraspinal procedures pending response to present treatment and F/U evaluation   Patient to call [facility] should patient have concerns prior to scheduled return appointmentPain Management Discharge Instructions  General Discharge Instructions :  If you need to reach your doctor call: Monday-Friday 8:00 am - 4:00 pm at [phone] or toll free [phone].  After clinic hours [phone] to have operator reach doctor.  Bring all of your medication bottles to all your appointments in the pain clinic.  To cancel or reschedule your appointment with Pain Management please remember to call 24 hours in advance to avoid a fee.  Refer to the educational materials which you have been given on: General Risks, I had my Procedure. Discharge Instructions, Post Sedation.  Post Procedure Instructions:  The drugs you were given will stay in your system until tomorrow, so for the next 24 hours you should not drive, make any legal decisions or drink any alcoholic beverages.  You may eat anything you prefer, but it is better to start with liquids then soups and crackers, and gradually work up to solid foods.  Please notify your doctor immediately if you have any unusual bleeding, trouble breathing or pain that is not related to your normal pain.  Depending on the type of procedure that was done, some parts of your body may feel week and/or numb.  This usually clears up  by tonight or the next day.  Walk with the use of an assistive device or accompanied by an adult for the 24 hours.  You may use ice on the affected area for the first 24 hours.  Put ice in a Ziploc bag and cover with a towel and place against area 15 minutes on 15 minutes off.  You may switch to heat after 24 hours.GENERAL RISKS AND COMPLICATIONS  What are the risk, side effects and possible complications? Generally speaking, most procedures are safe.  However, with any procedure there are risks, side effects, and the possibility of complications.  The risks and complications are dependent upon the sites that are lesioned, or the type of nerve block to be performed.  The closer the procedure is to the spine, the more serious the risks are.  Great care is taken when placing the radio frequency needles, block needles or lesioning probes, but sometimes complications can occur. 1. Infection: Any time there is an injection through the skin, there is a risk of infection.  This is why sterile conditions are used for these blocks.  There are four possible types of infection. 1. Localized skin infection. 2. Central Nervous System Infection-This can be in the form of Meningitis, which can be deadly. 3. Epidural Infections-This can be in the form of an epidural abscess, which can cause pressure inside of the spine, causing compression of the spinal cord with subsequent paralysis. This would require an emergency surgery to decompress, and there are no guarantees that the patient would recover from the paralysis. 4. Discitis-This is an infection of the intervertebral discs.  It occurs  in about 1% of discography procedures.  It is difficult to treat and it may lead to surgery.        2. Pain: the needles have to go through skin and soft tissues, will cause soreness.       3. Damage to internal structures:  The nerves to be lesioned may be near blood vessels or    other nerves which can be potentially damaged.        4. Bleeding: Bleeding is more common if the patient is taking blood thinners such as  aspirin, Coumadin, Ticiid, Plavix, etc., or if he/she have some genetic predisposition  such as hemophilia. Bleeding into the spinal canal can cause compression of the spinal  cord with subsequent paralysis.  This would require an emergency surgery to  decompress and there are no guarantees that the patient would recover from the  paralysis.       5. Pneumothorax:  Puncturing of a lung is a possibility, every time a needle is introduced in  the area of the chest or upper back.  Pneumothorax refers to free air around the  collapsed lung(s), inside of the thoracic cavity (chest cavity).  Another two possible  complications related to a similar event would include: Hemothorax and Chylothorax.   These are variations of the Pneumothorax, where instead of air around the collapsed  lung(s), you may have blood or chyle, respectively.       6. Spinal headaches: They may occur with any procedures in the area of the spine.       7. Persistent CSF (Cerebro-Spinal Fluid) leakage: This is a rare problem, but may occur  with prolonged intrathecal or epidural catheters either due to the formation of a fistulous  track or a dural tear.       8. Nerve damage: By working so close to the spinal cord, there is always a possibility of  nerve damage, which could be as serious as a permanent spinal cord injury with  paralysis.       9. Death:  Although rare, severe deadly allergic reactions known as "Anaphylactic  reaction" can occur to any of the medications used.      10. Worsening of the symptoms:  We can always make thing worse.  What are the chances of something like this happening? Chances of any of this occuring are extremely low.  By statistics, you have more of a chance of getting killed in a motor vehicle accident: while driving to the hospital than any of the above occurring .  Nevertheless, you should be aware that they are  possibilities.  In general, it is similar to taking a shower.  Everybody knows that you can slip, hit your head and get killed.  Does that mean that you should not shower again?  Nevertheless always keep in mind that statistics do not mean anything if you happen to be on the wrong side of them.  Even if a procedure has a 1 (one) in a 1,000,000 (million) chance of going wrong, it you happen to be that one..Also, keep in mind that by statistics, you have more of a chance of having something go wrong when taking medications.  Who should not have this procedure? If you are on a blood thinning medication (e.g. Coumadin, Plavix, see list of "Blood Thinners"), or if you have an active infection going on, you should not have the procedure.  If you are taking any blood thinners, please inform your physician.  How  should I prepare for this procedure?  Do not eat or drink anything at least six hours prior to the procedure.  Bring a driver with you .  It cannot be a taxi.  Come accompanied by an adult that can drive you back, and that is strong enough to help you if your legs get weak or numb from the local anesthetic.  Take all of your medicines the morning of the procedure with just enough water to swallow them.  If you have diabetes, make sure that you are scheduled to have your procedure done first thing in the morning, whenever possible.  If you have diabetes, take only half of your insulin dose and notify our nurse that you have done so as soon as you arrive at the clinic.  If you are diabetic, but only take blood sugar pills (oral hypoglycemic), then do not take them on the morning of your procedure.  You may take them after you have had the procedure.  Do not take aspirin or any aspirin-containing medications, at least eleven (11) days prior to the procedure.  They may prolong bleeding.  Wear loose fitting clothing that may be easy to take off and that you would not mind if it got stained with  Betadine or blood.  Do not wear any jewelry or perfume  Remove any nail coloring.  It will interfere with some of our monitoring equipment.  NOTE: Remember that this is not meant to be interpreted as a complete list of all possible complications.  Unforeseen problems may occur.  BLOOD THINNERS The following drugs contain aspirin or other products, which can cause increased bleeding during surgery and should not be taken for 2 weeks prior to and 1 week after surgery.  If you should need take something for relief of minor pain, you may take acetaminophen which is found in Tylenol,m Datril, Anacin-3 and Panadol. It is not blood thinner. The products listed below are.  Do not take any of the products listed below in addition to any listed on your instruction sheet.  A.P.C or A.P.C with Codeine Codeine Phosphate Capsules #3 Ibuprofen Ridaura  ABC compound Congesprin Imuran rimadil  Advil Cope Indocin Robaxisal  Alka-Seltzer Effervescent Pain Reliever and Antacid Coricidin or Coricidin-D  Indomethacin Rufen  Alka-Seltzer plus Cold Medicine Cosprin Ketoprofen S-A-C Tablets  Anacin Analgesic Tablets or Capsules Coumadin Korlgesic Salflex  Anacin Extra Strength Analgesic tablets or capsules CP-2 Tablets Lanoril Salicylate  Anaprox Cuprimine Capsules Levenox Salocol  Anexsia-D Dalteparin Magan Salsalate  Anodynos Darvon compound Magnesium Salicylate Sine-off  Ansaid Dasin Capsules Magsal Sodium Salicylate  Anturane Depen Capsules Marnal Soma  APF Arthritis pain formula Dewitt's Pills Measurin Stanback  Argesic Dia-Gesic Meclofenamic Sulfinpyrazone  Arthritis Bayer Timed Release Aspirin Diclofenac Meclomen Sulindac  Arthritis pain formula Anacin Dicumarol Medipren Supac  Analgesic (Safety coated) Arthralgen Diffunasal Mefanamic Suprofen  Arthritis Strength Bufferin Dihydrocodeine Mepro Compound Suprol  Arthropan liquid Dopirydamole Methcarbomol with Aspirin Synalgos  ASA tablets/Enseals Disalcid  Micrainin Tagament  Ascriptin Doan's Midol Talwin  Ascriptin A/D Dolene Mobidin Tanderil  Ascriptin Extra Strength Dolobid Moblgesic Ticlid  Ascriptin with Codeine Doloprin or Doloprin with Codeine Momentum Tolectin  Asperbuf Duoprin Mono-gesic Trendar  Aspergum Duradyne Motrin or Motrin IB Triminicin  Aspirin plain, buffered or enteric coated Durasal Myochrisine Trigesic  Aspirin Suppositories Easprin Nalfon Trillsate  Aspirin with Codeine Ecotrin Regular or Extra Strength Naprosyn Uracel  Atromid-S Efficin Naproxen Ursinus  Auranofin Capsules Elmiron Neocylate Vanquish  Axotal Emagrin Norgesic Verin  Azathioprine Empirin  or Empirin with Codeine Normiflo Vitamin E  Azolid Emprazil Nuprin Voltaren  Bayer Aspirin plain, buffered or children's or timed BC Tablets or powders Encaprin Orgaran Warfarin Sodium  Buff-a-Comp Enoxaparin Orudis Zorpin  Buff-a-Comp with Codeine Equegesic Os-Cal-Gesic   Buffaprin Excedrin plain, buffered or Extra Strength Oxalid   Bufferin Arthritis Strength Feldene Oxphenbutazone   Bufferin plain or Extra Strength Feldene Capsules Oxycodone with Aspirin   Bufferin with Codeine Fenoprofen Fenoprofen Pabalate or Pabalate-SF   Buffets II Flogesic Panagesic   Buffinol plain or Extra Strength Florinal or Florinal with Codeine Panwarfarin   Buf-Tabs Flurbiprofen Penicillamine   Butalbital Compound Four-way cold tablets Penicillin   Butazolidin Fragmin Pepto-Bismol   Carbenicillin Geminisyn Percodan   Carna Arthritis Reliever Geopen Persantine   Carprofen Gold's salt Persistin   Chloramphenicol Goody's Phenylbutazone   Chloromycetin Haltrain Piroxlcam   Clmetidine heparin Plaquenil   Cllnoril Hyco-pap Ponstel   Clofibrate Hydroxy chloroquine Propoxyphen         Before stopping any of these medications, be sure to consult the physician who ordered them.  Some, such as Coumadin (Warfarin) are ordered to prevent or treat serious conditions such as "deep thrombosis",  "pumonary embolisms", and other heart problems.  The amount of time that you may need off of the medication may also vary with the medication and the reason for which you were taking it.  If you are taking any of these medications, please make sure you notify your pain physician before you undergo any procedures.         Facet Joint Block, Care After Refer to this sheet in the next few weeks. These instructions provide you with information on caring for yourself after your procedure. Your health care provider may also give you more specific instructions. Your treatment has been planned according to current medical practices, but problems sometimes occur. Call your health care provider if you have any problems or questions after your procedure. HOME CARE INSTRUCTIONS  2. Keep track of the amount of pain relief you feel and how long it lasts. 3. Limit pain medicine within the first 4-6 hours after the procedure as directed by your health care provider. 4. Resume taking dietary supplements and medicines as directed by your health care provider. 5. You may resume your regular diet. 6. Do not apply heat near or over the injection site(s) for 24 hours.  7. Do not take a bath or soak in water (such as a pool or lake) for 24 hours. 8. Do not drive for 24 hours unless approved by your health care provider. 9. Avoid strenuous activity for 24 hours. 10. Remove your bandages the morning after the procedure.  11. If the injection site is tender, applying an ice pack may relieve some tenderness. To do this: 1. Put ice in a bag. 2. Place a towel between your skin and the bag. 3. Leave the ice on for 15-20 minutes, 3-4 times a day. 12. Keep follow-up appointments as directed by your health care provider. SEEK MEDICAL CARE IF:   Your pain is not controlled by your medicines.   There is drainage from the injection site.   There is significant bleeding or swelling at the injection site.  You have  diabetes and your blood sugar is above 180 mg/dL. SEEK IMMEDIATE MEDICAL CARE IF:   You develop a fever of 101F (38.3C) or greater.   You have worsening pain or swelling around the injection site.   You have red streaking around the injection  site.   You develop severe pain that is not controlled by your medicines.   You develop a headache, stiff neck, nausea, or vomiting.   Your eyes become very sensitive to light.   You have weakness, paralysis, or tingling in your arms or legs that was not present before the procedure.   You develop difficulty urinating or breathing.    This information is not intended to replace advice given to you by your health care provider. Make sure you discuss any questions you have with your health care provider.   Document Released: 12/29/2011 Document Revised: 02/01/2014 Document Reviewed: 12/29/2011 Elsevier Interactive Patient Education 2016 [employer] Facet Joint Block The facet joints connect the bones of the spine (vertebrae). They make it possible for you to bend, twist, and make other movements with your spine. They also prevent you from overbending, overtwisting, and making other excessive movements.  A facet joint block is a procedure where a numbing medicine (anesthetic) is injected into a facet joint. Often, a type of anti-inflammatory medicine called a steroid is also injected. A facet joint block may be done for two reasons:  13. Diagnosis. A facet joint block may be done as a test to see whether neck or back pain is caused by a worn-down or infected facet joint. If the pain gets better after a facet joint block, it means the pain is probably coming from the facet joint. If the pain does not get better, it means the pain is probably not coming from the facet joint.  14. Therapy. A facet joint block may be done to relieve neck or back pain caused by a facet joint. A facet joint block is only done as a therapy if the pain does not  improve with medicine, exercise programs, physical therapy, and other forms of pain management. LET [facility] CARE PROVIDER KNOW ABOUT:   Any allergies you have.   All medicines you are taking, including vitamins, herbs, eyedrops, and over-the-counter medicines and creams.   Previous problems you or members of your family have had with the use of anesthetics.   Any blood disorders you have had.   Other health problems you have. RISKS AND COMPLICATIONS Generally, having a facet joint block is safe. However, as with any procedure, complications can occur. Possible complications associated with having a facet joint block include:   Bleeding.   Injury to a nerve near the injection site.   Pain at the injection site.   Weakness or numbness in areas controlled by nerves near the injection site.   Infection.   Temporary fluid retention.   Allergic reaction to anesthetics or medicines used during the procedure. BEFORE THE PROCEDURE   Follow your health care provider's instructions if you are taking dietary supplements or medicines. You may need to stop taking them or reduce your dosage.   Do not take any new dietary supplements or medicines without asking your health care provider first.   Follow your health care provider's instructions about eating and drinking before the procedure. You may need to stop eating and drinking several hours before the procedure.   Arrange to have an adult drive you home after the procedure. PROCEDURE 12. You may need to remove your clothing and dress in an open-back gown so that your health care provider can access your spine.  13. The procedure will be done while you are lying on an X-ray table. Most of the time you will be asked to lie on your stomach, but  you may be asked to lie in a different position if an injection will be made in your neck.  14. Special machines will be used to monitor your oxygen levels, heart rate, and blood  pressure.  15. If an injection will be made in your neck, an intravenous (IV) tube will be inserted into one of your veins. Fluids and medicine will flow directly into your body through the IV tube.  16. The area over the facet joint where the injection will be made will be cleaned with an antiseptic soap. The surrounding skin will be covered with sterile drapes.  17. An anesthetic will be applied to your skin to make the injection area numb. You may feel a temporary stinging or burning sensation.  18. A video X-ray machine will be used to locate the joint. A contrast dye may be injected into the facet joint area to help with locating the joint.  19. When the joint is located, an anesthetic medicine will be injected into the joint through the needle.  65. Your health care provider will ask you whether you feel pain relief. If you do feel relief, a steroid may be injected to provide pain relief for a longer period of time. If you do not feel relief or feel only partial relief, additional injections of an anesthetic may be made in other facet joints.  21. The needle will be removed, the skin will be cleansed, and bandages will be applied.  AFTER THE PROCEDURE   You will be observed for 15-30 minutes before being allowed to go home. Do not drive. Have an adult drive you or take a taxi or public transportation instead.   If you feel pain relief, the pain will return in several hours or days when the anesthetic wears off.   You may feel pain relief 2-14 days after the procedure. The amount of time this relief lasts varies from person to person.   It is normal to feel some tenderness over the injected area(s) for 2 days following the procedure.   If you have diabetes, you may have a temporary increase in blood sugar.   This information is not intended to replace advice given to you by your health care provider. Make sure you discuss any questions you have with your health care provider.    Document Released: 06/02/2006 Document Revised: 02/01/2014 Document Reviewed: 11/01/2011 Elsevier Interactive Patient Education [employer].

## 2015-03-19 ENCOUNTER — Ambulatory Visit: Payer: No Typology Code available for payment source | Attending: Pain Medicine | Admitting: Pain Medicine

## 2015-03-19 ENCOUNTER — Encounter: Payer: Self-pay | Admitting: Pain Medicine

## 2015-03-19 VITALS — BP 111/67 | HR 76 | Temp 98.3°F | Resp 16 | Ht 67.0 in | Wt 232.0 lb

## 2015-03-19 DIAGNOSIS — M791 Myalgia: Secondary | ICD-10-CM | POA: Diagnosis not present

## 2015-03-19 DIAGNOSIS — M5136 Other intervertebral disc degeneration, lumbar region: Secondary | ICD-10-CM | POA: Diagnosis not present

## 2015-03-19 DIAGNOSIS — M545 Low back pain: Secondary | ICD-10-CM | POA: Diagnosis not present

## 2015-03-19 DIAGNOSIS — M25569 Pain in unspecified knee: Secondary | ICD-10-CM

## 2015-03-19 DIAGNOSIS — M47816 Spondylosis without myelopathy or radiculopathy, lumbar region: Secondary | ICD-10-CM

## 2015-03-19 DIAGNOSIS — M7061 Trochanteric bursitis, right hip: Secondary | ICD-10-CM

## 2015-03-19 DIAGNOSIS — M47818 Spondylosis without myelopathy or radiculopathy, sacral and sacrococcygeal region: Secondary | ICD-10-CM

## 2015-03-19 DIAGNOSIS — M7062 Trochanteric bursitis, left hip: Secondary | ICD-10-CM

## 2015-03-19 DIAGNOSIS — M47817 Spondylosis without myelopathy or radiculopathy, lumbosacral region: Secondary | ICD-10-CM | POA: Diagnosis not present

## 2015-03-19 DIAGNOSIS — M5416 Radiculopathy, lumbar region: Secondary | ICD-10-CM | POA: Diagnosis not present

## 2015-03-19 DIAGNOSIS — M79606 Pain in leg, unspecified: Secondary | ICD-10-CM | POA: Diagnosis present

## 2015-03-19 DIAGNOSIS — M25559 Pain in unspecified hip: Secondary | ICD-10-CM

## 2015-03-19 DIAGNOSIS — M533 Sacrococcygeal disorders, not elsewhere classified: Secondary | ICD-10-CM | POA: Diagnosis not present

## 2015-03-19 DIAGNOSIS — M170 Bilateral primary osteoarthritis of knee: Secondary | ICD-10-CM

## 2015-03-19 DIAGNOSIS — M461 Sacroiliitis, not elsewhere classified: Secondary | ICD-10-CM

## 2015-03-19 DIAGNOSIS — M160 Bilateral primary osteoarthritis of hip: Secondary | ICD-10-CM

## 2015-03-19 MED ORDER — BUPIVACAINE HCL (PF) 0.5 % IJ SOLN
INTRAMUSCULAR | Status: AC
  Administered 2015-03-19: 15:00:00
  Filled 2015-03-19: qty 30

## 2015-03-19 MED ORDER — CIPROFLOXACIN IN D5W 400 MG/200ML IV SOLN
INTRAVENOUS | Status: AC
  Administered 2015-03-19: 15:00:00 via INTRAVENOUS
  Filled 2015-03-19: qty 200

## 2015-03-19 MED ORDER — TRIAMCINOLONE ACETONIDE 40 MG/ML IJ SUSP
INTRAMUSCULAR | Status: AC
  Administered 2015-03-19: 15:00:00
  Filled 2015-03-19: qty 1

## 2015-03-19 MED ORDER — MIDAZOLAM HCL 5 MG/5ML IJ SOLN
INTRAMUSCULAR | Status: AC
  Administered 2015-03-19: 3 mg via INTRAVENOUS
  Filled 2015-03-19: qty 5

## 2015-03-19 MED ORDER — ORPHENADRINE CITRATE 30 MG/ML IJ SOLN
INTRAMUSCULAR | Status: AC
  Administered 2015-03-19: 15:00:00
  Filled 2015-03-19: qty 2

## 2015-03-19 MED ORDER — CIPROFLOXACIN HCL 250 MG PO TABS: 250.0000 mg | ORAL_TABLET | Freq: Two times a day (BID) | ORAL | Status: DC

## 2015-03-19 MED ORDER — BUPIVACAINE HCL (PF) 0.25 % IJ SOLN
INTRAMUSCULAR | Status: AC
  Administered 2015-03-19: 15:00:00
  Filled 2015-03-19: qty 30

## 2015-03-19 MED ORDER — FENTANYL CITRATE (PF) 100 MCG/2ML IJ SOLN: 100.0000 ug | Freq: Once | INTRAMUSCULAR | Status: DC

## 2015-03-19 MED ORDER — LACTATED RINGERS IV SOLN: 1000.0000 mL | INTRAVENOUS | Status: DC

## 2015-03-19 MED ORDER — CIPROFLOXACIN IN D5W 400 MG/200ML IV SOLN: 400.0000 mg | Freq: Once | INTRAVENOUS | Status: DC

## 2015-03-19 MED ORDER — TRIAMCINOLONE ACETONIDE 40 MG/ML IJ SUSP: 40.0000 mg | Freq: Once | INTRAMUSCULAR | Status: DC

## 2015-03-19 MED ORDER — MIDAZOLAM HCL 5 MG/5ML IJ SOLN: 5.0000 mg | Freq: Once | INTRAMUSCULAR | Status: DC

## 2015-03-19 MED ORDER — FENTANYL CITRATE (PF) 100 MCG/2ML IJ SOLN
INTRAMUSCULAR | Status: AC
  Administered 2015-03-19: 100 ug via INTRAVENOUS
  Filled 2015-03-19: qty 2

## 2015-03-19 MED ORDER — ORPHENADRINE CITRATE 30 MG/ML IJ SOLN: 60.0000 mg | Freq: Once | INTRAMUSCULAR | Status: DC

## 2015-03-19 MED ORDER — BUPIVACAINE HCL (PF) 0.25 % IJ SOLN: 30.0000 mL | Freq: Once | INTRAMUSCULAR | Status: DC

## 2015-03-19 MED ORDER — KETOROLAC TROMETHAMINE 60 MG/2ML IM SOLN
INTRAMUSCULAR | Status: AC
  Administered 2015-03-19: 15:00:00
  Filled 2015-03-19: qty 2

## 2015-03-19 NOTE — Progress Notes (Signed)
   Subjective:    Patient ID: Bethany Scott, female    DOB: 01/31/1955, 60 y.o.   MRN: [id number]  HPI    Review of Systems     Objective:   Physical Exam        Assessment & Plan:

## 2015-03-19 NOTE — Progress Notes (Signed)
Safety precautions to be maintained throughout the outpatient stay will include: orient to surroundings, keep bed in low position, maintain call bell within reach at all times, provide assistance with transfer out of bed and ambulation.  

## 2015-03-19 NOTE — Progress Notes (Signed)
Subjective:    Patient ID: Bethany Scott, female    DOB: 08-Oct-1955, 60 y.o.   MRN: [id number]  HPI  PROCEDURE PERFORMED: Lumbar facet (medial branch block)   NOTE: The patient is a 60 y.o. female who returns to [facility] for further evaluation and treatment of pain involving the lumbar and lower extremity region. Prior studies have revealed patient to be with degenerative disc disease of the lumbar spine. There is concern regarding significant component of patient's pain being due to lumbar facet syndrome. The patient also has pain involving the knee felt to be due to degenerative joint disease as well as component of ligamentous strain. The risks benefits and and expectations of the procedure have been discussed and explained to the patient who was understanding and in agreement with suggested treatment plan. We will proceed with interventional treatment as discussed and as explained to the patient who was understanding and wished to proceed with procedure as planned.   DESCRIPTION OF PROCEDURE: Lumbar facet (medial branch block) with IV Versed, IV fentanyl conscious sedation, EKG, blood pressure, pulse, and pulse oximetry monitoring. The procedure was performed with the patient in the prone position. Betadine prep of proposed entry site performed.   NEEDLE PLACEMENT AT: Left L 2 lumbar facet (medial branch block). Under fluoroscopic guidance with oblique orientation of 15 degrees, a 22-gauge needle was inserted at the L 2 vertebral body level with needle placed at the targeted area of Burton's Eye or Eye of the Scotty Dog with documentation of needle placement in the superior and lateral border of targeted area of Burton's Eye or Eye of the Scotty Dog with oblique orientation of 15 degrees. Following documentation of needle placement at the L 2 vertebral body level, needle placement was then accomplished at the L 3 vertebral body level.   NEEDLE PLACEMENT AT L3, L4, and L5 VERTEBRAL  BODY LEVELS ON THE LEFT SIDE The procedure was performed at the L3, L4, and L5 vertebral body levels exactly as was performed at the L 2 vertebral body level utilizing the same technique and under fluoroscopic guidance.  NEEDLE PLACEMENT AT THE SACRAL ALA with AP view of the lumbosacral spine. With the patient in the prone position, Betadine prep of proposed entry site accomplished, a 22 gauge needle was inserted in the region of the sacral ala (groove formed by the superior articulating process of S1 and the sacral wing). Following documentation of needle placement at the sacral ala,     Needle placement was then verified at all levels on lateral view. Following documentation of needle placement at all levels on lateral view and following negative aspiration for heme and CSF, each level was injected with 1 mL of 0.25% bupivacaine with Kenalog.     LUMBAR FACET, MEDIAL BRANCH NERVE, BLOCKS PERFORMED ON THE RIGHT SIDE   The procedure was performed on the right side exactly as was performed on the left side at the same levels and utilizing the same technique under fluoroscopic guidance.  Myoneural block injection of the left knee. Following Betadine prep of proposed entry site a 22-gauge needle was inserted in the lateral aspect of the left knee and a total of 2 cc of 0.25% bupivacaine with Toradol was injected for myoneural block injection of the left knee times two.    The patient tolerated the procedure well. A total of 40 mg of Kenalog was utilized for the procedure.   PLAN:  1. Medications: The patient will continue presently prescribed  medications tramadol and hydrocodone acetaminophen. The patient has been cautioned regarding taking a nonsteroidal anti-inflammatory medications  2. May consider modification of treatment regimen at time of return appointment pending response to treatment rendered on today's visit. 3. The patient is to follow-up with primary care physician Dr. Tamala Julian   for  further evaluation of blood pressure and general medical condition status post steroid injection performed on today's visit. 4. Surgical follow-up evaluation. May be considered  5. Neurological follow-up evaluation. May be considered  6. The patient may be candidate for radiofrequency procedures, implantation type procedures, and other treatment pending response to treatment and follow-up evaluation. 7. The patient has been advised to call the [facility] prior to scheduled return appointment should there be significant change in condition or should patient have other concerns regarding condition prior to scheduled return appointment.  The patient is understanding and in agreement with suggested treatment plan.   Review of Systems     Objective:   Physical Exam        Assessment & Plan:

## 2015-03-19 NOTE — Patient Instructions (Addendum)
PLAN  Continue present medications tramadol and hydrocodone acetaminophen IMPORTANT   NO VOLTAREN IF YOU TAKE MOBIC AND NO IBUPROFEN OR SIMILAR MEDICATIONS. Please get Cipro antibiotic today and begin taking Cipro antibiotic as prescribed  F/U PCP Dr. Tamala Julian for evaliation of  BP and general medical  condition  F/U surgical evaluation. May be considered for further evaluation as discussed  F/U neurological evaluation. May be considered for further evaluation as discussed M  May consider radiofrequency rhizolysis or intraspinal procedures pending response to present treatment and F/U evaluation   Patient to call Pain Management Center should patient have concerns prior to scheduled return appointmentGENERAL RISKS AND COMPLICATIONS  What are the risk, side effects and possible complications? Generally speaking, most procedures are safe.  However, with any procedure there are risks, side effects, and the possibility of complications.  The risks and complications are dependent upon the sites that are lesioned, or the type of nerve block to be performed.  The closer the procedure is to the spine, the more serious the risks are.  Great care is taken when placing the radio frequency needles, block needles or lesioning probes, but sometimes complications can occur. 1. Infection: Any time there is an injection through the skin, there is a risk of infection.  This is why sterile conditions are used for these blocks.  There are four possible types of infection. 1. Localized skin infection. 2. Central Nervous System Infection-This can be in the form of Meningitis, which can be deadly. 3. Epidural Infections-This can be in the form of an epidural abscess, which can cause pressure inside of the spine, causing compression of the spinal cord with subsequent paralysis. This would require an emergency surgery to decompress, and there are no guarantees that the patient would recover from the  paralysis. 4. Discitis-This is an infection of the intervertebral discs.  It occurs in about 1% of discography procedures.  It is difficult to treat and it may lead to surgery.        2. Pain: the needles have to go through skin and soft tissues, will cause soreness.       3. Damage to internal structures:  The nerves to be lesioned may be near blood vessels or    other nerves which can be potentially damaged.       4. Bleeding: Bleeding is more common if the patient is taking blood thinners such as  aspirin, Coumadin, Ticiid, Plavix, etc., or if he/she have some genetic predisposition  such as hemophilia. Bleeding into the spinal canal can cause compression of the spinal  cord with subsequent paralysis.  This would require an emergency surgery to  decompress and there are no guarantees that the patient would recover from the  paralysis.       5. Pneumothorax:  Puncturing of a lung is a possibility, every time a needle is introduced in  the area of the chest or upper back.  Pneumothorax refers to free air around the  collapsed lung(s), inside of the thoracic cavity (chest cavity).  Another two possible  complications related to a similar event would include: Hemothorax and Chylothorax.   These are variations of the Pneumothorax, where instead of air around the collapsed  lung(s), you may have blood or chyle, respectively.       6. Spinal headaches: They may occur with any procedures in the area of the spine.       7. Persistent CSF (Cerebro-Spinal Fluid) leakage: This is a rare problem, but  may occur  with prolonged intrathecal or epidural catheters either due to the formation of a fistulous  track or a dural tear.       8. Nerve damage: By working so close to the spinal cord, there is always a possibility of  nerve damage, which could be as serious as a permanent spinal cord injury with  paralysis.       9. Death:  Although rare, severe deadly allergic reactions known as "Anaphylactic  reaction" can  occur to any of the medications used.      10. Worsening of the symptoms:  We can always make thing worse.  What are the chances of something like this happening? Chances of any of this occuring are extremely low.  By statistics, you have more of a chance of getting killed in a motor vehicle accident: while driving to the hospital than any of the above occurring .  Nevertheless, you should be aware that they are possibilities.  In general, it is similar to taking a shower.  Everybody knows that you can slip, hit your head and get killed.  Does that mean that you should not shower again?  Nevertheless always keep in mind that statistics do not mean anything if you happen to be on the wrong side of them.  Even if a procedure has a 1 (one) in a 1,000,000 (million) chance of going wrong, it you happen to be that one..Also, keep in mind that by statistics, you have more of a chance of having something go wrong when taking medications.  Who should not have this procedure? If you are on a blood thinning medication (e.g. Coumadin, Plavix, see list of "Blood Thinners"), or if you have an active infection going on, you should not have the procedure.  If you are taking any blood thinners, please inform your physician.  How should I prepare for this procedure?  Do not eat or drink anything at least six hours prior to the procedure.  Bring a driver with you .  It cannot be a taxi.  Come accompanied by an adult that can drive you back, and that is strong enough to help you if your legs get weak or numb from the local anesthetic.  Take all of your medicines the morning of the procedure with just enough water to swallow them.  If you have diabetes, make sure that you are scheduled to have your procedure done first thing in the morning, whenever possible.  If you have diabetes, take only half of your insulin dose and notify our nurse that you have done so as soon as you arrive at the clinic.  If you are  diabetic, but only take blood sugar pills (oral hypoglycemic), then do not take them on the morning of your procedure.  You may take them after you have had the procedure.  Do not take aspirin or any aspirin-containing medications, at least eleven (11) days prior to the procedure.  They may prolong bleeding.  Wear loose fitting clothing that may be easy to take off and that you would not mind if it got stained with Betadine or blood.  Do not wear any jewelry or perfume  Remove any nail coloring.  It will interfere with some of our monitoring equipment.  NOTE: Remember that this is not meant to be interpreted as a complete list of all possible complications.  Unforeseen problems may occur.  BLOOD THINNERS The following drugs contain aspirin or other products, which can cause increased  bleeding during surgery and should not be taken for 2 weeks prior to and 1 week after surgery.  If you should need take something for relief of minor pain, you may take acetaminophen which is found in Tylenol,m Datril, Anacin-3 and Panadol. It is not blood thinner. The products listed below are.  Do not take any of the products listed below in addition to any listed on your instruction sheet.  A.P.C or A.P.C with Codeine Codeine Phosphate Capsules #3 Ibuprofen Ridaura  ABC compound Congesprin Imuran rimadil  Advil Cope Indocin Robaxisal  Alka-Seltzer Effervescent Pain Reliever and Antacid Coricidin or Coricidin-D  Indomethacin Rufen  Alka-Seltzer plus Cold Medicine Cosprin Ketoprofen S-A-C Tablets  Anacin Analgesic Tablets or Capsules Coumadin Korlgesic Salflex  Anacin Extra Strength Analgesic tablets or capsules CP-2 Tablets Lanoril Salicylate  Anaprox Cuprimine Capsules Levenox Salocol  Anexsia-D Dalteparin Magan Salsalate  Anodynos Darvon compound Magnesium Salicylate Sine-off  Ansaid Dasin Capsules Magsal Sodium Salicylate  Anturane Depen Capsules Marnal Soma  APF Arthritis pain formula Dewitt's Pills  Measurin Stanback  Argesic Dia-Gesic Meclofenamic Sulfinpyrazone  Arthritis Bayer Timed Release Aspirin Diclofenac Meclomen Sulindac  Arthritis pain formula Anacin Dicumarol Medipren Supac  Analgesic (Safety coated) Arthralgen Diffunasal Mefanamic Suprofen  Arthritis Strength Bufferin Dihydrocodeine Mepro Compound Suprol  Arthropan liquid Dopirydamole Methcarbomol with Aspirin Synalgos  ASA tablets/Enseals Disalcid Micrainin Tagament  Ascriptin Doan's Midol Talwin  Ascriptin A/D Dolene Mobidin Tanderil  Ascriptin Extra Strength Dolobid Moblgesic Ticlid  Ascriptin with Codeine Doloprin or Doloprin with Codeine Momentum Tolectin  Asperbuf Duoprin Mono-gesic Trendar  Aspergum Duradyne Motrin or Motrin IB Triminicin  Aspirin plain, buffered or enteric coated Durasal Myochrisine Trigesic  Aspirin Suppositories Easprin Nalfon Trillsate  Aspirin with Codeine Ecotrin Regular or Extra Strength Naprosyn Uracel  Atromid-S Efficin Naproxen Ursinus  Auranofin Capsules Elmiron Neocylate Vanquish  Axotal Emagrin Norgesic Verin  Azathioprine Empirin or Empirin with Codeine Normiflo Vitamin E  Azolid Emprazil Nuprin Voltaren  Bayer Aspirin plain, buffered or children's or timed BC Tablets or powders Encaprin Orgaran Warfarin Sodium  Buff-a-Comp Enoxaparin Orudis Zorpin  Buff-a-Comp with Codeine Equegesic Os-Cal-Gesic   Buffaprin Excedrin plain, buffered or Extra Strength Oxalid   Bufferin Arthritis Strength Feldene Oxphenbutazone   Bufferin plain or Extra Strength Feldene Capsules Oxycodone with Aspirin   Bufferin with Codeine Fenoprofen Fenoprofen Pabalate or Pabalate-SF   Buffets II Flogesic Panagesic   Buffinol plain or Extra Strength Florinal or Florinal with Codeine Panwarfarin   Buf-Tabs Flurbiprofen Penicillamine   Butalbital Compound Four-way cold tablets Penicillin   Butazolidin Fragmin Pepto-Bismol   Carbenicillin Geminisyn Percodan   Carna Arthritis Reliever Geopen Persantine    Carprofen Gold's salt Persistin   Chloramphenicol Goody's Phenylbutazone   Chloromycetin Haltrain Piroxlcam   Clmetidine heparin Plaquenil   Cllnoril Hyco-pap Ponstel   Clofibrate Hydroxy chloroquine Propoxyphen         Before stopping any of these medications, be sure to consult the physician who ordered them.  Some, such as Coumadin (Warfarin) are ordered to prevent or treat serious conditions such as "deep thrombosis", "pumonary embolisms", and other heart problems.  The amount of time that you may need off of the medication may also vary with the medication and the reason for which you were taking it.  If you are taking any of these medications, please make sure you notify your pain physician before you undergo any procedures.

## 2015-03-20 ENCOUNTER — Telehealth: Payer: Self-pay | Admitting: *Deleted

## 2015-03-20 NOTE — Telephone Encounter (Signed)
Spoke with patient, verbalizes no complications or concerns from procedure on yesterday.  

## 2015-03-22 ENCOUNTER — Encounter: Payer: Self-pay | Admitting: Family Medicine

## 2015-03-24 ENCOUNTER — Other Ambulatory Visit: Payer: Self-pay

## 2015-03-24 MED ORDER — LISINOPRIL 5 MG PO TABS: 5.0000 mg | ORAL_TABLET | Freq: Every day | ORAL | Status: DC

## 2015-03-26 ENCOUNTER — Other Ambulatory Visit: Payer: Self-pay | Admitting: Pain Medicine

## 2015-04-01 ENCOUNTER — Encounter: Payer: Self-pay | Admitting: Family Medicine

## 2015-04-02 ENCOUNTER — Encounter: Payer: Self-pay | Admitting: Pain Medicine

## 2015-04-02 ENCOUNTER — Ambulatory Visit: Payer: 59 | Attending: Pain Medicine | Admitting: Pain Medicine

## 2015-04-02 VITALS — BP 133/69 | HR 98 | Temp 97.8°F | Resp 16 | Ht 67.0 in | Wt 230.0 lb

## 2015-04-02 DIAGNOSIS — M47816 Spondylosis without myelopathy or radiculopathy, lumbar region: Secondary | ICD-10-CM

## 2015-04-02 DIAGNOSIS — M533 Sacrococcygeal disorders, not elsewhere classified: Secondary | ICD-10-CM | POA: Insufficient documentation

## 2015-04-02 DIAGNOSIS — M160 Bilateral primary osteoarthritis of hip: Secondary | ICD-10-CM | POA: Diagnosis not present

## 2015-04-02 DIAGNOSIS — M7062 Trochanteric bursitis, left hip: Secondary | ICD-10-CM

## 2015-04-02 DIAGNOSIS — M7060 Trochanteric bursitis, unspecified hip: Secondary | ICD-10-CM | POA: Diagnosis not present

## 2015-04-02 DIAGNOSIS — M47817 Spondylosis without myelopathy or radiculopathy, lumbosacral region: Secondary | ICD-10-CM | POA: Diagnosis not present

## 2015-04-02 DIAGNOSIS — M170 Bilateral primary osteoarthritis of knee: Secondary | ICD-10-CM | POA: Diagnosis not present

## 2015-04-02 DIAGNOSIS — M5136 Other intervertebral disc degeneration, lumbar region: Secondary | ICD-10-CM | POA: Diagnosis not present

## 2015-04-02 DIAGNOSIS — M25569 Pain in unspecified knee: Secondary | ICD-10-CM

## 2015-04-02 DIAGNOSIS — M461 Sacroiliitis, not elsewhere classified: Secondary | ICD-10-CM | POA: Diagnosis not present

## 2015-04-02 DIAGNOSIS — M79606 Pain in leg, unspecified: Secondary | ICD-10-CM | POA: Diagnosis present

## 2015-04-02 DIAGNOSIS — M545 Low back pain: Secondary | ICD-10-CM | POA: Diagnosis present

## 2015-04-02 DIAGNOSIS — M7061 Trochanteric bursitis, right hip: Secondary | ICD-10-CM

## 2015-04-02 DIAGNOSIS — M5416 Radiculopathy, lumbar region: Secondary | ICD-10-CM | POA: Diagnosis not present

## 2015-04-02 DIAGNOSIS — M47818 Spondylosis without myelopathy or radiculopathy, sacral and sacrococcygeal region: Secondary | ICD-10-CM

## 2015-04-02 DIAGNOSIS — M25559 Pain in unspecified hip: Secondary | ICD-10-CM

## 2015-04-02 DIAGNOSIS — M791 Myalgia: Secondary | ICD-10-CM | POA: Diagnosis not present

## 2015-04-02 MED ORDER — TRAMADOL HCL 50 MG PO TABS: ORAL_TABLET | ORAL | Status: DC

## 2015-04-02 MED ORDER — HYDROCODONE-ACETAMINOPHEN 7.5-325 MG PO TABS: ORAL_TABLET | ORAL | Status: DC

## 2015-04-02 NOTE — Progress Notes (Signed)
   Subjective:    Patient ID: Bethany Scott, female    DOB: May 09, 1955, 60 y.o.   MRN: [id number]  HPI The patient is a 60 year old female who returns to pain management for further evaluation and treatment of pain involving the lower back and lower extremity region predominantly. The patient is status post lumbar facet, medial branch nerve, blocks. At the present time patient states the pain appears to be fairly well controlled. We will continue presently prescribed medications and patient states that she will avoid nonsteroidal anti-inflammatory medications. The patient will continue tramadol as well as hydrocodone acetaminophen. The patient has history of GI condition and prefers to avoid nonsteroidal anti-inflammatory medications. We will avoid interventional treatment and will continue presently prescribed medications. The patient is to call pain management prior to scheduled return appointment should they be return of any significant pain and we will consider modification of treatment regimen as discussed and as explained to patient on today's visit. All agreed to suggested treatment plan.   Review of Systems     Objective:   Physical Exam  There was tenderness over the splenius capitis and occipitalis muscles regions of mild degree with mild tenderness of the cervical facet cervical paraspinal muscular region. Palpation of the acromioclavicular and glenohumeral joint regions was with mild just palpation area the patient appeared to be with bilaterally equal grip strength and Tinel and Phalen's maneuver were without increase of pain of significant degree there was tenderness to palpation thoracic facet thoracic paraspinal musculature region of mild to moderate degree with mild to moderate muscle spasms noted in the thoracic region without crepitus of the thoracic region noted. Palpation over the lumbar paraspinal muscles lumbar facet region was attends to palpation of moderate degree. Lateral  bending rotation extension and palpation of the lumbar facets reproduced moderate discomfort. Straight leg raising was tolerates approximately 30 without increased pain with dorsiflexion noted. There was tends to palpation of the knees and crepitus of the knees with negative anterior and posterior drawer signs without ballottement of the patella. There was no erythema of the knees noted. EHL strength appeared to be slightly decreased and no sensory deficit or dermatomal distribution was detected. There was negative clonus negative Homans. DTRs were difficult to elicit patient had difficulty relaxing. Abdomen nontender with no costovertebral tenderness noted      Assessment & Plan:     Degenerative disc disease lumbar spine  Lumbar facet syndrome  Sacroiliac joint dysfunction   Sacroiliitis  Degenerative joint disease of hips  Degenerative joint disease of knees  Degenerative disc disease of spine  Greater trochanteric bursitis     PLAN  Continue present medications tramadol and hydrocodone acetaminophen IMPORTANT   NO VOLTAREN IF YOU TAKE MOBIC AND NO IBUPROFEN OR SIMILAR MEDICATIONS as previously discussed  F/U PCP Dr. Tamala Julian for evaliation of  BP and general medical  condition Patient has appointment scheduled for end of March  F/U surgical evaluation. May be considered for further evaluation as discussed  F/U neurological evaluation. May be considered for further evaluation as discussed M  May consider radiofrequency rhizolysis or intraspinal procedures pending response to present treatment and F/U evaluation Ask nurses for information regarding radiofrequency procedures   Patient to call [facility] Center should patient have concerns prior to scheduled return appointment

## 2015-04-02 NOTE — Patient Instructions (Addendum)
PLAN  Continue present medications tramadol and hydrocodone acetaminophen IMPORTANT   NO VOLTAREN IF YOU TAKE MOBIC AND NO IBUPROFEN OR SIMILAR MEDICATIONS as previously discussed  F/U PCP Dr. Tamala Julian for evaliation of  BP and general medical  condition Patient has appointment scheduled for end of March  F/U surgical evaluation. May be considered for further evaluation as discussed  F/U neurological evaluation. May be considered for further evaluation as discussed M  May consider radiofrequency rhizolysis or intraspinal procedures pending response to present treatment and F/U evaluation Ask nurses for information regarding radiofrequency procedures   Patient to call [facility] Center should patient have concerns prior to scheduled return appointmentRadiofrequency Lesioning Radiofrequency lesioning is a procedure that is performed to relieve pain. The procedure is often used for back, neck, or arm pain. Radiofrequency lesioning involves the use of a machine that creates radio waves to make heat. During the procedure, the heat is applied to the nerve that carries the pain signal. The heat damages the nerve and interferes with the pain signal. Pain relief usually lasts for 6 months to 1 year. LET [facility] CARE PROVIDER KNOW ABOUT:  Any allergies you have.  All medicines you are taking, including vitamins, herbs, eye drops, creams, and over-the-counter medicines.  Previous problems you or members of your family have had with the use of anesthetics.  Any blood disorders you have.  Previous surgeries you have had.  Any medical conditions you have.  Whether you are pregnant or may be pregnant. RISKS AND COMPLICATIONS Generally, this is a safe procedure. However, problems may occur, including:  Pain or soreness at the injection site.  Infection at the injection site.  Damage to nerves or blood vessels. BEFORE THE PROCEDURE  Ask your health care provider about:  Changing or  stopping your regular medicines. This is especially important if you are taking diabetes medicines or blood thinners.  Taking medicines such as aspirin and ibuprofen. These medicines can thin your blood. Do not take these medicines before your procedure if your health care provider instructs you not to.  Follow instructions from your health care provider about eating or drinking restrictions.  Plan to have someone take you home after the procedure.  If you go home right after the procedure, plan to have someone with you for 24 hours. PROCEDURE  You will be given one or more of the following:  A medicine to help you relax (sedative).  A medicine to numb the area (local anesthetic).  You will be awake during the procedure. You will need to be able to talk with the health care provider during the procedure.  With the help of a type of X-ray (fluoroscopy), the health care provider will insert a radiofrequency needle into the area to be treated.  Next, a wire that carries the radio waves (electrode) will be put through the radiofrequency needle. An electrical pulse will be sent through the electrode to verify the correct nerve. You will feel a tingling sensation, and you may have muscle twitching.  Then, the tissue that is around the needle tip will be heated by an electric current that is passed using the radiofrequency machine. This will numb the nerves.  A bandage (dressing) will be put on the insertion area after the procedure is done. The procedure may vary among health care providers and hospitals. AFTER THE PROCEDURE  Your blood pressure, heart rate, breathing rate, and blood oxygen level will be monitored often until the medicines you were given have  worn off.  Return to your normal activities as directed by your health care provider.   This information is not intended to replace advice given to you by your health care provider. Make sure you discuss any questions you have with  your health care provider.   Document Released: 09/09/2010 Document Revised: 10/02/2014 Document Reviewed: 02/18/2014 Elsevier Interactive Patient Education 2016 [employer]vier [employer]. Pain Management Discharge Instructions  General Discharge Instructions :  If you need to reach your doctor call: Monday-Friday 8:00 am - 4:00 pm at [phone] or toll free [phone].  After clinic hours [phone] to have operator reach doctor.  Bring all of your medication bottles to all your appointments in the pain clinic.  To cancel or reschedule your appointment with Pain Management please remember to call 24 hours in advance to avoid a fee.  Refer to the educational materials which you have been given on: General Risks, I had my Procedure. Discharge Instructions, Post Sedation.  Post Procedure Instructions:  The drugs you were given will stay in your system until tomorrow, so for the next 24 hours you should not drive, make any legal decisions or drink any alcoholic beverages.  You may eat anything you prefer, but it is better to start with liquids then soups and crackers, and gradually work up to solid foods.  Please notify your doctor immediately if you have any unusual bleeding, trouble breathing or pain that is not related to your normal pain.  Depending on the type of procedure that was done, some parts of your body may feel week and/or numb.  This usually clears up by tonight or the next day.  Walk with the use of an assistive device or accompanied by an adult for the 24 hours.  You may use ice on the affected area for the first 24 hours.  Put ice in a Ziploc bag and cover with a towel and place against area 15 minutes on 15 minutes off.  You may switch to heat after 24 hours.

## 2015-04-02 NOTE — Progress Notes (Signed)
Safety precautions to be maintained throughout the outpatient stay will include: orient to surroundings, keep bed in low position, maintain call bell within reach at all times, provide assistance with transfer out of bed and ambulation.  

## 2015-04-07 MED ORDER — ALPRAZOLAM 0.5 MG PO TABS: 0.5000 mg | ORAL_TABLET | Freq: Every day | ORAL | Status: DC | PRN

## 2015-04-07 NOTE — Telephone Encounter (Signed)
Please call in refill of Xanax to [facility] for one month only.

## 2015-04-22 ENCOUNTER — Encounter: Payer: Self-pay | Admitting: Family Medicine

## 2015-04-22 ENCOUNTER — Ambulatory Visit (INDEPENDENT_AMBULATORY_CARE_PROVIDER_SITE_OTHER): Payer: 59 | Admitting: Family Medicine

## 2015-04-22 VITALS — BP 123/80 | HR 93 | Temp 98.7°F | Resp 16 | Ht 66.0 in | Wt 236.8 lb

## 2015-04-22 DIAGNOSIS — R7302 Impaired glucose tolerance (oral): Secondary | ICD-10-CM

## 2015-04-22 DIAGNOSIS — M160 Bilateral primary osteoarthritis of hip: Secondary | ICD-10-CM | POA: Diagnosis not present

## 2015-04-22 DIAGNOSIS — M170 Bilateral primary osteoarthritis of knee: Secondary | ICD-10-CM

## 2015-04-22 DIAGNOSIS — Z114 Encounter for screening for human immunodeficiency virus [HIV]: Secondary | ICD-10-CM

## 2015-04-22 DIAGNOSIS — F418 Other specified anxiety disorders: Secondary | ICD-10-CM

## 2015-04-22 DIAGNOSIS — Z1159 Encounter for screening for other viral diseases: Secondary | ICD-10-CM | POA: Diagnosis not present

## 2015-04-22 DIAGNOSIS — I10 Essential (primary) hypertension: Secondary | ICD-10-CM

## 2015-04-22 DIAGNOSIS — M545 Low back pain: Secondary | ICD-10-CM | POA: Diagnosis not present

## 2015-04-22 DIAGNOSIS — E669 Obesity, unspecified: Secondary | ICD-10-CM | POA: Diagnosis not present

## 2015-04-22 DIAGNOSIS — E7800 Pure hypercholesterolemia, unspecified: Secondary | ICD-10-CM

## 2015-04-22 DIAGNOSIS — M47816 Spondylosis without myelopathy or radiculopathy, lumbar region: Secondary | ICD-10-CM

## 2015-04-22 DIAGNOSIS — M25569 Pain in unspecified knee: Secondary | ICD-10-CM

## 2015-04-22 DIAGNOSIS — Z6838 Body mass index (BMI) 38.0-38.9, adult: Secondary | ICD-10-CM

## 2015-04-22 DIAGNOSIS — M25559 Pain in unspecified hip: Secondary | ICD-10-CM | POA: Diagnosis not present

## 2015-04-22 LAB — CBC WITH DIFFERENTIAL/PLATELET
Basophils Absolute: 0 10*3/uL (ref 0.0–0.1)
Basophils Relative: 0 % (ref 0–1)
EOS ABS: 0.2 10*3/uL (ref 0.0–0.7)
EOS PCT: 3 % (ref 0–5)
HCT: 40.5 % (ref 36.0–46.0)
HEMOGLOBIN: 13.3 g/dL (ref 12.0–15.0)
LYMPHS ABS: 3.2 10*3/uL (ref 0.7–4.0)
Lymphocytes Relative: 47 % — ABNORMAL HIGH (ref 12–46)
MCH: 28.9 pg (ref 26.0–34.0)
MCHC: 32.8 g/dL (ref 30.0–36.0)
MCV: 88 fL (ref 78.0–100.0)
MONO ABS: 0.5 10*3/uL (ref 0.1–1.0)
MONOS PCT: 7 % (ref 3–12)
MPV: 8.8 fL (ref 8.6–12.4)
Neutro Abs: 2.9 10*3/uL (ref 1.7–7.7)
Neutrophils Relative %: 43 % (ref 43–77)
PLATELETS: 367 10*3/uL (ref 150–400)
RBC: 4.6 MIL/uL (ref 3.87–5.11)
RDW: 13.2 % (ref 11.5–15.5)
WBC: 6.8 10*3/uL (ref 4.0–10.5)

## 2015-04-22 LAB — POCT URINALYSIS DIP (MANUAL ENTRY)
Bilirubin, UA: NEGATIVE
Glucose, UA: NEGATIVE
Ketones, POC UA: NEGATIVE
Nitrite, UA: NEGATIVE
Protein Ur, POC: NEGATIVE
Spec Grav, UA: 1.01
Urobilinogen, UA: 0.2
pH, UA: 6

## 2015-04-22 MED ORDER — LISINOPRIL 5 MG PO TABS: 5.0000 mg | ORAL_TABLET | Freq: Every day | ORAL | Status: DC

## 2015-04-22 MED ORDER — CYCLOBENZAPRINE HCL 5 MG PO TABS: 5.0000 mg | ORAL_TABLET | Freq: Three times a day (TID) | ORAL | Status: DC | PRN

## 2015-04-22 MED ORDER — DULOXETINE HCL 60 MG PO CPEP: 60.0000 mg | ORAL_CAPSULE | Freq: Two times a day (BID) | ORAL | Status: DC

## 2015-04-22 MED ORDER — PANTOPRAZOLE SODIUM 40 MG PO TBEC: 40.0000 mg | DELAYED_RELEASE_TABLET | Freq: Two times a day (BID) | ORAL | Status: DC

## 2015-04-22 MED ORDER — ATORVASTATIN CALCIUM 10 MG PO TABS: 10.0000 mg | ORAL_TABLET | Freq: Every day | ORAL | Status: DC

## 2015-04-22 NOTE — Patient Instructions (Addendum)
IF you received an x-ray today, you will receive an invoice from [facility]. Please contact [facility] at [phone] with questions or concerns regarding your invoice.   IF you received labwork today, you will receive an invoice from [employer]. Please contact [facility] at [phone] with questions or concerns regarding your invoice.   Our billing staff will not be able to assist you with questions regarding bills from these companies.  You will be contacted with the lab results as soon as they are available. The fastest way to get your results is to activate your My Chart account. Instructions are located on the last page of this paperwork. If you have not heard from [place] regarding the results in 2 weeks, please contact this office.    Weight loss surgery seminars at [facility] [phone]): May 20, 2015 (6:00-8:00pm) Weight loss surgery seminars at [facility] [phone])  Bariatric Surgery Information Bariatric surgery, also called weight loss surgery, is a procedure that helps you lose weight. You may consider or your health care provider may suggest bariatric surgery if:  You are severely obese and have been unable to lose weight through diet and exercise.  You have health problems related to obesity, such as:  Type 2 diabetes.  Heart disease.  Lung disease. HOW DOES BARIATRIC SURGERY HELP ME LOSE WEIGHT?  Bariatric surgery helps you lose weight by decreasing how much food your body absorbs. This is done by closing off part of your stomach to make it smaller. This restricts the amount of food your stomach can hold. Bariatric surgery can also change your body's regular digestive process, so that food bypasses the parts of your body that absorb calories and nutrients.  If you decide to have bariatric surgery, it is important to continue to eat a healthy diet and exercise regularly after the surgery.  WHAT ARE THE DIFFERENT KINDS OF  BARIATRIC SURGERY?  There are two kinds of bariatric surgeries:  Restrictive surgeries make your stomach smaller. They do not change your digestive process. The smaller the size of your new stomach, the less food you can eat. There are different types of restrictive surgeries.  Malabsorptive surgeries both make your stomach smaller and alter your digestive process so that your body processes less calories and nutrients. These are the most common kind of bariatric surgery. There are different types of malabsorptive surgeries. WHAT ARE THE DIFFERENT TYPES OF RESTRICTIVE SURGERY? Adjustable Gastric Banding In this procedure, an inflatable band is placed around your stomach near the upper end. This makes the passageway for food into the rest of your stomach much smaller. The band can be adjusted, making it tighter or looser, by filling it with salt solution. Your surgeon can adjust the band based on how are you feeling and how much weight you are losing. The band can be removed in the future.  Vertical Banded Gastroplasty In this procedure, staples are used to separate your stomach into two parts, a small upper pouch and a bigger lower pouch. This decreases how much food you can eat. Sleeve Gastrectomy In this procedure, your stomach is made smaller. This is done by surgically removing a large part of your stomach. When your stomach is smaller, you feel full more quickly and reduce how much you eat. WHAT ARE THE DIFFERENT TYPES OF MALABSORPTIVE SURGERY? Roux-en-Y Gastric Bypass (RGB) This is the most common weight loss surgery. In this procedure, a small stomach pouch is created in the upper part of your  stomach. Next, this small stomach pouch is attached directly to the middle part of your small intestine. The farther down your small intestine the new connection is made, the fewer calories and nutrients you will absorb.  Biliopancreatic Diversion with Duodenal Switch (BPD/DS)  This is a multi-step  procedure. In this procedure, a large part of your stomach is removed, making your stomach smaller. Next, this smaller stomach is attached to the lower part of your small intestine. Like the RGB surgery, you absorb fewer calories and nutrients the farther down your small intestine the attachment is made.   WHAT ARE THE RISKS OF BARIATRIC SURGERY? As with any surgical procedure, each type of bariatric surgery has its own risks. These risks also depend on your age, your overall health, and any other medical conditions you may have. When deciding on bariatric surgery, it is very important to:   Talk to your health care provider and choose the surgery that is best for you.  Ask your health care provider about specific risks for the surgery you choose. FOR MORE INFORMATION  American Society for Metabolic & Bariatric Surgery: www.asmbs.org  Weight-control Information Network (WIN): win.AmenCredit.is   This information is not intended to replace advice given to you by your health care provider. Make sure you discuss any questions you have with your health care provider.   Document Released: 01/11/2005 Document Revised: 10/02/2014 Document Reviewed: 07/12/2012 Elsevier Interactive Patient Education [employer].

## 2015-04-22 NOTE — Progress Notes (Signed)
Subjective:    Patient ID: Bethany Scott, female    DOB: 02-23-1955, 60 y.o.   MRN: [id number]  04/22/2015  Follow-up   HPI This 60 y.o. female presents for nine month follow-up:   1.  Glucose Intolerance: due to schedule.  2. HTN: Patient reports good compliance with medication, good tolerance to medication, and good symptom control.    3.  Hyperlipidemia: Patient reports good compliance with medication, good tolerance to medication, and good symptom control.    4.  Anxiety and depression: Patient reports good compliance with medication, good tolerance to medication, and good symptom control.    5.  Allergic Rhinitis/asthma: Patient reports good compliance with medication, good tolerance to medication, and good symptom control.    6. Obesity: interested in lap band surgery.     Review of Systems  Constitutional: Negative for fever, chills, diaphoresis and fatigue.  Eyes: Negative for visual disturbance.  Respiratory: Negative for cough and shortness of breath.   Cardiovascular: Negative for chest pain, palpitations and leg swelling.  Gastrointestinal: Negative for nausea, vomiting, abdominal pain, diarrhea and constipation.  Endocrine: Negative for cold intolerance, heat intolerance, polydipsia, polyphagia and polyuria.  Neurological: Negative for dizziness, tremors, seizures, syncope, facial asymmetry, speech difficulty, weakness, light-headedness, numbness and headaches.    Past Medical History  Diagnosis Date  . Allergy   . Anxiety   . Asthma   . Depression   . Diabetes mellitus without complication ([place])   . GERD (gastroesophageal reflux disease)   . Hyperlipidemia   . Migraine   . Gastric ulcer, unspecified as acute or chronic, without mention of hemorrhage, perforation, or obstruction   . Personal history of colonic polyps   . Other chronic cystitis   . Obesity, unspecified   . Hypertension   . Arthritis     OA Knee; non-specific autoimmune process followed  by [facility]/Rheumatology.   Past Surgical History  Procedure Laterality Date  . Cesarean section      x 2  . Tonsillectomy and adenoidectomy    . Sigmoid resection / rectopexy    . Bilateral oophorectomy  2001  . Neck surgery    . Nasal septum surgery    . Spine surgery    . Appendectomy    . Colon surgery    . Rheumatology consult  11/26/2011    multiple arthralgias. Autoimmune labs negative.  Rx for Plaquenil for non-specific autoimmune process.  [name].  . Esophagogastroduodenoscopy  09/08/2012    +HH.  [name].  Symptoms: anemia, hemoccult +.  . Colonoscopy  09/08/2012    diverticulosis, hemorrhoids.  [name].  Symptoms:  anemia, hemoccult +.  . Abdominal hysterectomy  01/25/1994    uterine fibroids; DUB; cervical dysplasia; ovaries resected two years later.     Allergies  Allergen Reactions  . Cephalosporins Anaphylaxis  . Penicillins Anaphylaxis    Social History   Social History  . Marital Status: Married    Spouse Name: N/A  . Number of Children: 2  . Years of Education: 12   Occupational History  . 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 Smoking status: Former Research scientist (life sciences)  . Smokeless tobacco: Never Used     Comment: Quit 26 years ago  . Alcohol Use: Yes     Comment: rarley 3 times per year or less  . Drug Use: No  . Sexual Activity: Yes   Other Topics Concern  . Not  on file   Social History Narrative   Marital status: married since 79 years; happily married; no abuse.      Children: 2 sons (21, 61); 1 granddaughter (53 months)      Lives: with husband.      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      Tobacco abuse:  Former smoker.      Alcohol:  none      Exercise: none   Family History  Problem Relation Age of Onset  . Bipolar disorder Father   . Transient ischemic attack Father   . Dementia Father     secondary to head trauma  . Hypertension Father    . Diabetes Father   . Cancer Mother 54    lung  . Migraines Mother   . Hypertension Sister   . Atrial fibrillation Sister   . Alcohol abuse Brother   . Hypertension Brother   . Alcohol abuse Brother   . Atrial fibrillation Maternal Grandmother   . COPD Maternal Grandmother   . Diabetes Maternal Grandmother   . Hyperlipidemia Maternal Grandmother   . Diabetes Maternal Grandfather   . Diabetes Paternal Grandfather   . Heart disease Paternal Grandfather        Objective:    BP 123/80 mmHg  Pulse 93  Temp(Src) 98.7 F (37.1 C) (Oral)  Resp 16  Ht 5\' 6"  (1.676 m)  Wt 236 lb 12.8 oz (107.412 kg)  BMI 38.24 kg/m2 Physical Exam  Constitutional: She is oriented to person, place, and time. She appears well-developed and well-nourished. No distress.  HENT:  Head: Normocephalic and atraumatic.  Right Ear: External ear normal.  Left Ear: External ear normal.  Nose: Nose normal.  Mouth/Throat: Oropharynx is clear and moist.  Eyes: Conjunctivae and EOM are normal. Pupils are equal, round, and reactive to light.  Neck: Normal range of motion. Neck supple. Carotid bruit is not present. No thyromegaly present.  Cardiovascular: Normal rate, regular rhythm, normal heart sounds and intact distal pulses.  Exam reveals no gallop and no friction rub.   No murmur heard. Pulmonary/Chest: Effort normal and breath sounds normal. She has no wheezes. She has no rales.  Abdominal: Soft. Bowel sounds are normal. She exhibits no distension and no mass. There is no tenderness. There is no rebound and no guarding.  Lymphadenopathy:    She has no cervical adenopathy.  Neurological: She is alert and oriented to person, place, and time. No cranial nerve deficit.  Skin: Skin is warm and dry. No rash noted. She is not diaphoretic. No erythema. No pallor.  Psychiatric: She has a normal mood and affect. Her behavior is normal.        Assessment & Plan:   1. Pure hypercholesterolemia   2. Essential  hypertension, benign   3. Depression with anxiety   4. Chronic arthralgias of knees and hips, unspecified laterality   5. Facet syndrome, lumbar   6. Primary osteoarthritis of both knees   7. Primary osteoarthritis of both hips   8. Screening for HIV (human immunodeficiency virus)   9. Need for hepatitis C screening test   10. Glucose intolerance (impaired glucose tolerance)   11. Obesity   12. BMI 38.0-38.9,adult     Orders Placed This Encounter  Procedures  . CBC with Differential/Platelet  . Comprehensive metabolic panel    Order Specific Question:  Has the patient fasted?    Answer:  Yes  . Hemoglobin A1c  .  HIV antibody  . Lipid panel    Order Specific Question:  Has the patient fasted?    Answer:  Yes  . TSH  . Microalbumin, urine  . Hepatitis C antibody  . Ambulatory referral to General Surgery    Referral Priority:  Routine    Referral Type:  Surgical    Referral Reason:  Specialty Services Required    Requested Specialty:  General Surgery    Number of Visits Requested:  1  . POCT urinalysis dipstick  . EKG 12-Lead   Meds ordered this encounter  Medications  . pantoprazole (PROTONIX) 40 MG tablet    Sig: Take 1 tablet (40 mg total) by mouth 2 (two) times daily.    Dispense:  60 tablet    Refill:  11  . cyclobenzaprine (FLEXERIL) 5 MG tablet    Sig: Take 1 tablet (5 mg total) by mouth 3 (three) times daily as needed for muscle spasms.    Dispense:  30 tablet    Refill:  0  . lisinopril (PRINIVIL,ZESTRIL) 5 MG tablet    Sig: Take 1 tablet (5 mg total) by mouth daily.    Dispense:  90 tablet    Refill:  1  . DULoxetine (CYMBALTA) 60 MG capsule    Sig: Take 1 capsule (60 mg total) by mouth 2 (two) times daily.    Dispense:  180 capsule    Refill:  1  . atorvastatin (LIPITOR) 10 MG tablet    Sig: Take 1 tablet (10 mg total) by mouth daily at 6 PM.    Dispense:  90 tablet    Refill:  1    No Follow-up on file.    Lashelle Koy Elayne Guerin, M.D. [facility] [address] [place], [place]  [address] [phone] phone [phone] fax

## 2015-04-23 LAB — COMPREHENSIVE METABOLIC PANEL
ALBUMIN: 4.2 g/dL (ref 3.6–5.1)
ALT: 22 U/L (ref 6–29)
AST: 17 U/L (ref 10–35)
Alkaline Phosphatase: 71 U/L (ref 33–130)
BILIRUBIN TOTAL: 0.3 mg/dL (ref 0.2–1.2)
BUN: 10 mg/dL (ref 7–25)
CHLORIDE: 99 mmol/L (ref 98–110)
CO2: 29 mmol/L (ref 20–31)
CREATININE: 0.66 mg/dL (ref 0.50–1.05)
Calcium: 9.5 mg/dL (ref 8.6–10.4)
Glucose, Bld: 112 mg/dL — ABNORMAL HIGH (ref 65–99)
Potassium: 4.1 mmol/L (ref 3.5–5.3)
SODIUM: 137 mmol/L (ref 135–146)
TOTAL PROTEIN: 6.6 g/dL (ref 6.1–8.1)

## 2015-04-23 LAB — HEMOGLOBIN A1C
Hgb A1c MFr Bld: 5.9 % — ABNORMAL HIGH (ref <5.7)
Mean Plasma Glucose: 123 mg/dL

## 2015-04-23 LAB — LIPID PANEL
CHOL/HDL RATIO: 4.5 ratio (ref <=5.0)
CHOLESTEROL: 207 mg/dL — AB (ref 125–200)
HDL: 46 mg/dL (ref >=46)
LDL Cholesterol: 131 mg/dL — ABNORMAL HIGH (ref <130)
TRIGLYCERIDES: 152 mg/dL — AB (ref <150)
VLDL: 30 mg/dL (ref <30)

## 2015-04-23 LAB — HIV ANTIBODY (ROUTINE TESTING W REFLEX): HIV 1&2 Ab, 4th Generation: NONREACTIVE

## 2015-04-23 LAB — MICROALBUMIN, URINE: Microalb, Ur: 0.7 mg/dL

## 2015-04-23 LAB — HEPATITIS C ANTIBODY: HCV AB: NEGATIVE

## 2015-04-23 LAB — TSH: TSH: 1.61 mIU/L

## 2015-04-30 ENCOUNTER — Ambulatory Visit: Payer: 59 | Attending: Pain Medicine | Admitting: Pain Medicine

## 2015-04-30 ENCOUNTER — Encounter: Payer: Self-pay | Admitting: Pain Medicine

## 2015-04-30 VITALS — BP 122/77 | HR 99 | Temp 98.0°F | Resp 16 | Ht 67.0 in | Wt 232.0 lb

## 2015-04-30 DIAGNOSIS — M5136 Other intervertebral disc degeneration, lumbar region: Secondary | ICD-10-CM | POA: Insufficient documentation

## 2015-04-30 DIAGNOSIS — M533 Sacrococcygeal disorders, not elsewhere classified: Secondary | ICD-10-CM | POA: Diagnosis not present

## 2015-04-30 DIAGNOSIS — M461 Sacroiliitis, not elsewhere classified: Secondary | ICD-10-CM | POA: Insufficient documentation

## 2015-04-30 DIAGNOSIS — M25559 Pain in unspecified hip: Secondary | ICD-10-CM

## 2015-04-30 DIAGNOSIS — M170 Bilateral primary osteoarthritis of knee: Secondary | ICD-10-CM | POA: Diagnosis not present

## 2015-04-30 DIAGNOSIS — M47818 Spondylosis without myelopathy or radiculopathy, sacral and sacrococcygeal region: Secondary | ICD-10-CM

## 2015-04-30 DIAGNOSIS — M47816 Spondylosis without myelopathy or radiculopathy, lumbar region: Secondary | ICD-10-CM

## 2015-04-30 DIAGNOSIS — M7060 Trochanteric bursitis, unspecified hip: Secondary | ICD-10-CM | POA: Diagnosis not present

## 2015-04-30 DIAGNOSIS — M545 Low back pain: Secondary | ICD-10-CM | POA: Diagnosis present

## 2015-04-30 DIAGNOSIS — M25569 Pain in unspecified knee: Secondary | ICD-10-CM

## 2015-04-30 DIAGNOSIS — M7061 Trochanteric bursitis, right hip: Secondary | ICD-10-CM

## 2015-04-30 DIAGNOSIS — M7062 Trochanteric bursitis, left hip: Secondary | ICD-10-CM

## 2015-04-30 DIAGNOSIS — M79606 Pain in leg, unspecified: Secondary | ICD-10-CM | POA: Diagnosis present

## 2015-04-30 DIAGNOSIS — M791 Myalgia: Secondary | ICD-10-CM | POA: Diagnosis not present

## 2015-04-30 DIAGNOSIS — M47817 Spondylosis without myelopathy or radiculopathy, lumbosacral region: Secondary | ICD-10-CM | POA: Diagnosis not present

## 2015-04-30 DIAGNOSIS — M160 Bilateral primary osteoarthritis of hip: Secondary | ICD-10-CM | POA: Insufficient documentation

## 2015-04-30 DIAGNOSIS — M5416 Radiculopathy, lumbar region: Secondary | ICD-10-CM | POA: Diagnosis not present

## 2015-04-30 MED ORDER — TRAMADOL HCL 50 MG PO TABS: ORAL_TABLET | ORAL | Status: DC

## 2015-04-30 MED ORDER — HYDROCODONE-ACETAMINOPHEN 7.5-325 MG PO TABS: ORAL_TABLET | ORAL | Status: DC

## 2015-04-30 NOTE — Patient Instructions (Addendum)
PLAN  Continue present medications tramadol and hydrocodone acetaminophen IMPORTANT   NO VOLTAREN IF YOU TAKE MOBIC AND NO IBUPROFEN OR SIMILAR MEDICATIONS as previously discussed  Lumbar facet, medial branch nerve blocks to be performed at time of return appointment  F/U PCP Dr. Tamala Julian for evaliation of  BP and general medical  condition  F/U surgical evaluation. May be considered for further evaluation as discussed  F/U neurological evaluation. May be considered for further evaluation as discussed M  May consider radiofrequency rhizolysis or intraspinal procedures pending response to present treatment and F/U evaluation Ask nurses for information regarding radiofrequency procedures and also asked the secretary and Angie to let you know when insurance approved you for radiofrequency lumbar facets right side   Patient to call [facility] should patient have concerns prior to scheduled return appointment  A prescription for TRAMADOL, HYDROCODONE was given to you today. Facet Blocks Patient Information  Description: The facets are joints in the spine between the vertebrae.  Like any joints in the body, facets can become irritated and painful.  Arthritis can also effect the facets.  By injecting steroids and local anesthetic in and around these joints, we can temporarily block the nerve supply to them.  Steroids act directly on irritated nerves and tissues to reduce selling and inflammation which often leads to decreased pain.  Facet blocks may be done anywhere along the spine from the neck to the low back depending upon the location of your pain.   After numbing the skin with local anesthetic (like Novocaine), a small needle is passed onto the facet joints under x-ray guidance.  You may experience a sensation of pressure while this is being done.  The entire block usually lasts about 15-25 minutes.   Conditions which may be treated by facet blocks:   Low back/buttock  pain  Neck/shoulder pain  Certain types of headaches  Preparation for the injection:   Do not eat any solid food or dairy products within 8 hours of your appointment.  You may drink clear liquid up to 3 hours before appointment.  Clear liquids include water, black coffee, juice or soda.  No milk or cream please.  You may take your regular medication, including pain medications, with a sip of water before your appointment.  Diabetics should hold regular insulin (if taken separately) and take 1/2 normal NPH dose the morning of the procedure.  Carry some sugar containing items with you to your appointment.  A driver must accompany you and be prepared to drive you home after your procedure.  Bring all your current medications with you.  An IV may be inserted and sedation may be given at the discretion of the physician.  A blood pressure cuff, EKG and other monitors will often be applied during the procedure.  Some patients may need to have extra oxygen administered for a short period.  You will be asked to provide medical information, including your allergies and medications, prior to the procedure.  We must know immediately if you are taking blood thinners (like Coumadin/Warfarin) or if you are allergic to IV iodine contrast (dye).  We must know if you could possible be pregnant.  Possible side-effects:   Bleeding from needle site  Infection (rare, may require surgery)  Nerve injury (rare)  Numbness & tingling (temporary)  Difficulty urinating (rare, temporary)  Spinal headache (a headache worse with upright posture)  Light-headedness (temporary)  Pain at injection site (serveral days)  Decreased blood pressure (rare, temporary)  Weakness in arm/leg (temporary)  Pressure sensation in back/neck (temporary)   Call if you experience:   Fever/chills associated with headache or increased back/neck pain  Headache worsened by an upright position  New onset, weakness or  numbness of an extremity below the injection site  Hives or difficulty breathing (go to the emergency room)  Inflammation or drainage at the injection site(s)  Severe back/neck pain greater than usual  New symptoms which are concerning to you  Please note:  Although the local anesthetic injected can often make your back or neck feel good for several hours after the injection, the pain will likely return. It takes 3-7 days for steroids to work.  You may not notice any pain relief for at least one week.  If effective, we will often do a series of 2-3 injections spaced 3-6 weeks apart to maximally decrease your pain.  After the initial series, you may be a candidate for a more permanent nerve block of the facets.  If you have any questions, please call #[phone]) [facility]   Radiofrequency Lesioning Radiofrequency lesioning is a procedure that is performed to relieve pain. The procedure is often used for back, neck, or arm pain. Radiofrequency lesioning involves the use of a machine that creates radio waves to make heat. During the procedure, the heat is applied to the nerve that carries the pain signal. The heat damages the nerve and interferes with the pain signal. Pain relief usually lasts for 6 months to 1 year. LET [facility] CARE PROVIDER KNOW ABOUT:  Any allergies you have.  All medicines you are taking, including vitamins, herbs, eye drops, creams, and over-the-counter medicines.  Previous problems you or members of your family have had with the use of anesthetics.  Any blood disorders you have.  Previous surgeries you have had.  Any medical conditions you have.  Whether you are pregnant or may be pregnant. RISKS AND COMPLICATIONS Generally, this is a safe procedure. However, problems may occur, including:  Pain or soreness at the injection site.  Infection at the injection site.  Damage to nerves or blood vessels. BEFORE THE  PROCEDURE  Ask your health care provider about:  Changing or stopping your regular medicines. This is especially important if you are taking diabetes medicines or blood thinners.  Taking medicines such as aspirin and ibuprofen. These medicines can thin your blood. Do not take these medicines before your procedure if your health care provider instructs you not to.  Follow instructions from your health care provider about eating or drinking restrictions.  Plan to have someone take you home after the procedure.  If you go home right after the procedure, plan to have someone with you for 24 hours. PROCEDURE  You will be given one or more of the following:  A medicine to help you relax (sedative).  A medicine to numb the area (local anesthetic).  You will be awake during the procedure. You will need to be able to talk with the health care provider during the procedure.  With the help of a type of X-ray (fluoroscopy), the health care provider will insert a radiofrequency needle into the area to be treated.  Next, a wire that carries the radio waves (electrode) will be put through the radiofrequency needle. An electrical pulse will be sent through the electrode to verify the correct nerve. You will feel a tingling sensation, and you may have muscle twitching.  Then, the tissue that is around  the needle tip will be heated by an electric current that is passed using the radiofrequency machine. This will numb the nerves.  A bandage (dressing) will be put on the insertion area after the procedure is done. The procedure may vary among health care providers and hospitals. AFTER THE PROCEDURE  Your blood pressure, heart rate, breathing rate, and blood oxygen level will be monitored often until the medicines you were given have worn off.  Return to your normal activities as directed by your health care provider.   This information is not intended to replace advice given to you by your health  care provider. Make sure you discuss any questions you have with your health care provider.   Document Released: 09/09/2010 Document Revised: 10/02/2014 Document Reviewed: 02/18/2014 Elsevier Interactive Patient Education [employer].

## 2015-04-30 NOTE — Progress Notes (Signed)
   Subjective:    Patient ID: Bethany Scott, female    DOB: [birth year], 60 y.o.   MRN: [id number]  HPI  The patient is a 60 year old female who returns to pain management for further evaluation and treatment of pain involving the lower back and lower extremity region predominantly . The patient states that the pain increased with twisting turning maneuvers with the right side being more painful. The patient denies any trauma change in events of daily living the call significant change in symptomatology. The patient would like to proceed with radiofrequency rhizolysis lumbar facet, medial branch blocks on the right side at L3, L4, and L5. We will request insurance approval for radiofrequency procedures on the right lumbar region. We we will proceed with lumbar facet, medial branch nerve blocks to be performed at time return appointment to provide decrease of patient's severely disabling pain at this time and proceed with radiofrequency procedures once patient has been approved. We will continue tramadol and hydrocodone acetaminophen. All understanding and agreement suggested treatment plan.  Review of Systems     Objective:   Physical Exam  There was tenderness to palpation of the paraspinal musculature region cervical region cervical facet region palpation which reproduces mild discomfort with mild tenderness over the splenius capitis and occipitalis musculature regions. Palpation of the acromioclavicular and glenohumeral joint regions were attends to palpation of mild degree and patient appeared to be with bilaterally equal grip strength without significant increase of pain with Tinel and Phalen's maneuver. Palpation over the thoracic facet thoracic paraspinal musculature region was attends to palpation of moderate degree in the lower thoracic region with no crepitus of the thoracic region noted. Palpation over the PSIS and PII S region was of increased pain on the right greater than the left. Patient  was with pain of moderate degree with palpation over the PSIS and PII S region. Palpation of the greater trochanteric region iliotibial band region was with moderate discomfort. Straight leg raise was tolerates approximately 30 without increased pain with dorsiflexion noted with negative clonus negative Homans. No sensory deficit or dermatomal dystrophy detected. The predominant portion of patient's pain was reproduced with the palpation of the lumbar facets with lateral bending rotation extension and palpation of the lumbar facets. The abdomen was nontender with no costovertebral tenderness noted.      Assessment & Plan:    Degenerative disc disease lumbar spine  Lumbar facet syndrome  Sacroiliac joint dysfunction   Sacroiliitis  Degenerative joint disease of hips  Degenerative joint disease of knees  Degenerative disc disease of spine  Greater trochanteric bursitis      PLAN  Continue present medications tramadol and hydrocodone acetaminophen IMPORTANT   NO VOLTAREN IF YOU TAKE MOBIC AND NO IBUPROFEN OR SIMILAR MEDICATIONS as previously discussed  Lumbar facet, medial branch nerve blocks to be performed at time of return appointment  F/U PCP Dr. Tamala Julian for evaliation of  BP and general medical  condition  F/U surgical evaluation. May be considered for further evaluation as discussed  F/U neurological evaluation. May be considered for further evaluation as discussed M  May consider radiofrequency rhizolysis or intraspinal procedures pending response to present treatment and F/U evaluation Ask nurses for information regarding radiofrequency procedures and also asked the secretary and Angie to let you know when insurance approved you for radiofrequency lumbar facets right side   Patient to call [facility] should patient have concerns prior to scheduled return appointment

## 2015-04-30 NOTE — Progress Notes (Signed)
Safety precautions to be maintained throughout the outpatient stay will include: orient to surroundings, keep bed in low position, maintain call bell within reach at all times, provide assistance with transfer out of bed and ambulation.  

## 2015-05-08 MED ORDER — ATORVASTATIN CALCIUM 20 MG PO TABS: 20.0000 mg | ORAL_TABLET | Freq: Every day | ORAL | Status: DC

## 2015-05-08 NOTE — Addendum Note (Signed)
Addended by: [name] on: 05/08/2015 11:11 AM   Modules accepted: Orders

## 2015-05-15 ENCOUNTER — Encounter: Payer: Self-pay | Admitting: Family Medicine

## 2015-05-15 ENCOUNTER — Other Ambulatory Visit: Payer: Self-pay | Admitting: Family Medicine

## 2015-05-17 ENCOUNTER — Encounter: Payer: Self-pay | Admitting: Family Medicine

## 2015-05-17 DIAGNOSIS — J301 Allergic rhinitis due to pollen: Secondary | ICD-10-CM

## 2015-05-19 ENCOUNTER — Ambulatory Visit: Payer: 59 | Admitting: Pain Medicine

## 2015-05-21 ENCOUNTER — Encounter: Payer: Self-pay | Admitting: Family Medicine

## 2015-05-21 ENCOUNTER — Ambulatory Visit: Payer: 59 | Admitting: Pain Medicine

## 2015-05-21 ENCOUNTER — Other Ambulatory Visit: Payer: Self-pay

## 2015-05-26 MED ORDER — PHENAZOPYRIDINE HCL 100 MG PO TABS: 100.0000 mg | ORAL_TABLET | Freq: Three times a day (TID) | ORAL | Status: DC | PRN

## 2015-05-26 MED ORDER — NITROFURANTOIN MONOHYD MACRO 100 MG PO CAPS: ORAL_CAPSULE | ORAL | Status: DC

## 2015-05-26 MED ORDER — MONTELUKAST SODIUM 10 MG PO TABS: 10.0000 mg | ORAL_TABLET | Freq: Every day | ORAL | Status: DC

## 2015-05-26 MED ORDER — ALPRAZOLAM 0.25 MG PO TABS: 0.2500 mg | ORAL_TABLET | Freq: Every evening | ORAL | Status: DC | PRN

## 2015-05-26 MED ORDER — BECLOMETHASONE DIPROPIONATE 80 MCG/ACT IN AERS: 2.0000 | INHALATION_SPRAY | Freq: Two times a day (BID) | RESPIRATORY_TRACT | Status: DC

## 2015-05-26 NOTE — Telephone Encounter (Signed)
Please call in rx for Xanax 0.25mg  one at bedtime to pharmacy.

## 2015-05-27 NOTE — Telephone Encounter (Signed)
Called in.

## 2015-05-28 ENCOUNTER — Telehealth: Payer: Self-pay | Admitting: Family Medicine

## 2015-05-28 NOTE — Telephone Encounter (Signed)
Left a message for patient to return call to see if she has had an eye exam.  If so where and when

## 2015-05-29 ENCOUNTER — Ambulatory Visit: Payer: 59 | Attending: Pain Medicine | Admitting: Pain Medicine

## 2015-05-29 ENCOUNTER — Encounter: Payer: Self-pay | Admitting: Pain Medicine

## 2015-05-29 VITALS — BP 135/77 | HR 95 | Temp 98.5°F | Resp 16 | Ht 67.0 in | Wt 230.0 lb

## 2015-05-29 DIAGNOSIS — M461 Sacroiliitis, not elsewhere classified: Secondary | ICD-10-CM | POA: Insufficient documentation

## 2015-05-29 DIAGNOSIS — M791 Myalgia: Secondary | ICD-10-CM | POA: Diagnosis not present

## 2015-05-29 DIAGNOSIS — M7062 Trochanteric bursitis, left hip: Secondary | ICD-10-CM | POA: Diagnosis not present

## 2015-05-29 DIAGNOSIS — M5136 Other intervertebral disc degeneration, lumbar region: Secondary | ICD-10-CM | POA: Insufficient documentation

## 2015-05-29 DIAGNOSIS — M25559 Pain in unspecified hip: Secondary | ICD-10-CM

## 2015-05-29 DIAGNOSIS — M533 Sacrococcygeal disorders, not elsewhere classified: Secondary | ICD-10-CM | POA: Diagnosis not present

## 2015-05-29 DIAGNOSIS — M7060 Trochanteric bursitis, unspecified hip: Secondary | ICD-10-CM | POA: Insufficient documentation

## 2015-05-29 DIAGNOSIS — M1610 Unilateral primary osteoarthritis, unspecified hip: Secondary | ICD-10-CM | POA: Insufficient documentation

## 2015-05-29 DIAGNOSIS — M25569 Pain in unspecified knee: Secondary | ICD-10-CM | POA: Diagnosis not present

## 2015-05-29 DIAGNOSIS — M160 Bilateral primary osteoarthritis of hip: Secondary | ICD-10-CM | POA: Insufficient documentation

## 2015-05-29 DIAGNOSIS — M47816 Spondylosis without myelopathy or radiculopathy, lumbar region: Secondary | ICD-10-CM

## 2015-05-29 DIAGNOSIS — M7061 Trochanteric bursitis, right hip: Secondary | ICD-10-CM | POA: Diagnosis not present

## 2015-05-29 DIAGNOSIS — M170 Bilateral primary osteoarthritis of knee: Secondary | ICD-10-CM | POA: Insufficient documentation

## 2015-05-29 DIAGNOSIS — M47818 Spondylosis without myelopathy or radiculopathy, sacral and sacrococcygeal region: Secondary | ICD-10-CM

## 2015-05-29 DIAGNOSIS — M5416 Radiculopathy, lumbar region: Secondary | ICD-10-CM | POA: Diagnosis not present

## 2015-05-29 DIAGNOSIS — M47817 Spondylosis without myelopathy or radiculopathy, lumbosacral region: Secondary | ICD-10-CM | POA: Diagnosis not present

## 2015-05-29 DIAGNOSIS — M545 Low back pain: Secondary | ICD-10-CM | POA: Diagnosis not present

## 2015-05-29 DIAGNOSIS — M79606 Pain in leg, unspecified: Secondary | ICD-10-CM | POA: Diagnosis present

## 2015-05-29 MED ORDER — MONTELUKAST SODIUM 10 MG PO TABS: 10.0000 mg | ORAL_TABLET | Freq: Every day | ORAL | Status: DC

## 2015-05-29 MED ORDER — TRAMADOL HCL 50 MG PO TABS: ORAL_TABLET | ORAL | Status: DC

## 2015-05-29 MED ORDER — HYDROCODONE-ACETAMINOPHEN 7.5-325 MG PO TABS: ORAL_TABLET | ORAL | Status: DC

## 2015-05-29 NOTE — Progress Notes (Signed)
Safety precautions to be maintained throughout the outpatient stay will include: orient to surroundings, keep bed in low position, maintain call bell within reach at all times, provide assistance with transfer out of bed and ambulation.  

## 2015-05-29 NOTE — Addendum Note (Signed)
Addended by: [name] on: 05/29/2015 11:01 AM   Modules accepted: Orders

## 2015-05-29 NOTE — Patient Instructions (Addendum)
PLAN  Continue present medications tramadol and hydrocodone acetaminophen IMPORTANT   NO VOLTAREN IF YOU TAKE MOBIC AND NO IBUPROFEN OR SIMILAR MEDICATIONS as previously discussed  Femoral and obturator nerve blocks to be performed at time of return appointment  F/U PCP Dr. Tamala Julian for evaliation of  BP and general medical  condition  F/U surgical evaluation. May be considered for further evaluation as discussed  F/U neurological evaluation. May be considered for further evaluation as discussed May consider PNCV/EMG studies and other studies  May consider radiofrequency rhizolysis or intraspinal procedures pending response to present treatment and F/U evaluation Ask nurses for information regarding radiofrequency procedures and also asked the secretary and Angie to let you know when insurance approved you for radiofrequency lumbar facets right side as we previously discussed  Patient to call [facility] Center should patient have concerns prior to scheduled return appointment

## 2015-05-30 NOTE — Progress Notes (Signed)
Subjective:    Patient ID: Bethany Scott, female    DOB: 1955/05/28, 60 y.o.   MRN: [id number]  HPI  The patient is a 60 year old female who returns to pain management for further evaluation and treatment of pain involving the region of the lower back and lower extremity region predominantly. The patient is with pain involving the region of the hips of significant degree. The patient has had improvement of pain involving the lower back and lower extremity regions. The patient denies any trauma change in events of daily living the call significant change in symptomatology. We discussed patient's condition and will consider patient for treatment of pain involving the region of the hip at time of return appointment. We will proceed with femoral nerve block and obturator nerve block for treatment of pain involving the hip.. The patient will continue presently prescribed medications consisting of tramadol and hydrocodone acetaminophen.. The patient is to call pain management prior to scheduled return appointment should there be significant change in condition. All agreed to suggested treatment plan     Review of Systems     Objective:   Physical Exam  There was tenderness to palpation of the paraspinal musculature region cervical region cervical facet region a mild degree with mild tenderness of the splenius capitis and occipitalis musculature regions. Palpation of the acromioclavicular and glenohumeral joint regions reproduce mild discomfort as well. Palpation over the region of the thoracic facet thoracic paraspinal musculature region was with mild tenderness to palpation and no crepitus of the thoracic region was noted. Palpation over the lumbar paraspinal must reason lumbar facet region was attends to palpation of mild to moderate degree. Lateral bending rotation extension and palpation of the lumbar facets reproduce mild to moderate discomfort. There was evidence of mild muscle spasms involving  the lumbar paraspinal musculature region as well as the lower thoracic paraspinal musculature region. Palpation of the PSIS and PII S regions reproduce moderate discomfort with moderate tenderness of the greater trochanteric region iliotibial band region is well. A raising was tolerates approximately 30 without a definite increase of pain with dorsiflexion noted.. There was increase of pain with pressure applied to the ileum with patient in lateral decubitus position. Patient was with increased pain with Patrick's maneuver as well. The knees were with mild tenderness to palpation. There was crepitus of the knees noted as well There was negative clonus negative Homans. The abdomen was nontender and no costovertebral tenderness was noted      Assessment & Plan:    Degenerative joint disease of hips  Degenerative disc disease lumbar spine  Lumbar facet syndrome  Sacroiliac joint dysfunction   Sacroiliitis  Degenerative joint disease of knees  Degenerative disc disease of spine  Greater trochanteric bursitis     PLAN  Continue present medications tramadol and hydrocodone acetaminophen IMPORTANT   NO VOLTAREN IF YOU TAKE MOBIC AND NO IBUPROFEN OR SIMILAR MEDICATIONS as previously discussed  Femoral and obturator nerve blocks to be performed at time of return appointment for treatment of pain involving the region of the hip  F/U PCP Dr. Tamala Julian for evaliation of  BP and general medical  condition  F/U surgical evaluation. May be considered for further evaluation as discussed  F/U neurological evaluation. May be considered for further evaluation as discussed May consider PNCV/EMG studies and other studies  May consider radiofrequency rhizolysis or intraspinal procedures pending response to present treatment and F/U evaluation Ask nurses for information regarding radiofrequency procedures and also asked the secretary  and Angie to let you know when insurance approved you for  radiofrequency lumbar facets right side as we previously discussed  Patient to call [facility] should patient have concerns prior to scheduled return appointment

## 2015-06-03 ENCOUNTER — Encounter: Payer: Self-pay | Admitting: Family Medicine

## 2015-06-05 ENCOUNTER — Encounter: Payer: Self-pay | Admitting: Family Medicine

## 2015-06-05 LAB — TOXASSURE SELECT 13 (MW), URINE

## 2015-06-05 NOTE — Progress Notes (Signed)
Quick Note:  Reviewed. ______ 

## 2015-06-09 MED ORDER — GABAPENTIN 300 MG PO CAPS: 300.0000 mg | ORAL_CAPSULE | Freq: Every day | ORAL | Status: DC

## 2015-06-09 NOTE — Telephone Encounter (Signed)
Please call in refill of Xanax 0.25mg  one qhs to [facility].  I approved on 05/26/15; not sure if has been called in.  After calling in, please notify patient.

## 2015-06-11 ENCOUNTER — Ambulatory Visit: Payer: 59 | Admitting: Pain Medicine

## 2015-06-17 NOTE — Telephone Encounter (Signed)
Called in Rx again to [facility] to make sure that they received it. Notified pt on mychart.

## 2015-06-20 ENCOUNTER — Encounter: Payer: Self-pay | Admitting: Family Medicine

## 2015-06-24 ENCOUNTER — Encounter: Payer: Self-pay | Admitting: Family Medicine

## 2015-06-25 ENCOUNTER — Encounter: Payer: Self-pay | Admitting: Pain Medicine

## 2015-06-25 ENCOUNTER — Ambulatory Visit: Payer: 59 | Attending: Pain Medicine | Admitting: Pain Medicine

## 2015-06-25 VITALS — BP 117/59 | HR 85 | Temp 97.7°F | Resp 16 | Ht 67.0 in | Wt 230.0 lb

## 2015-06-25 DIAGNOSIS — M461 Sacroiliitis, not elsewhere classified: Secondary | ICD-10-CM | POA: Insufficient documentation

## 2015-06-25 DIAGNOSIS — M79606 Pain in leg, unspecified: Secondary | ICD-10-CM | POA: Diagnosis present

## 2015-06-25 DIAGNOSIS — M25569 Pain in unspecified knee: Secondary | ICD-10-CM

## 2015-06-25 DIAGNOSIS — M545 Low back pain: Secondary | ICD-10-CM | POA: Diagnosis present

## 2015-06-25 DIAGNOSIS — M47816 Spondylosis without myelopathy or radiculopathy, lumbar region: Secondary | ICD-10-CM

## 2015-06-25 DIAGNOSIS — M5136 Other intervertebral disc degeneration, lumbar region: Secondary | ICD-10-CM | POA: Insufficient documentation

## 2015-06-25 DIAGNOSIS — M25559 Pain in unspecified hip: Secondary | ICD-10-CM

## 2015-06-25 DIAGNOSIS — M5481 Occipital neuralgia: Secondary | ICD-10-CM | POA: Insufficient documentation

## 2015-06-25 DIAGNOSIS — M533 Sacrococcygeal disorders, not elsewhere classified: Secondary | ICD-10-CM | POA: Diagnosis not present

## 2015-06-25 DIAGNOSIS — M7060 Trochanteric bursitis, unspecified hip: Secondary | ICD-10-CM | POA: Diagnosis not present

## 2015-06-25 DIAGNOSIS — M160 Bilateral primary osteoarthritis of hip: Secondary | ICD-10-CM | POA: Insufficient documentation

## 2015-06-25 DIAGNOSIS — G43909 Migraine, unspecified, not intractable, without status migrainosus: Secondary | ICD-10-CM | POA: Diagnosis not present

## 2015-06-25 DIAGNOSIS — M791 Myalgia: Secondary | ICD-10-CM | POA: Diagnosis not present

## 2015-06-25 DIAGNOSIS — M1610 Unilateral primary osteoarthritis, unspecified hip: Secondary | ICD-10-CM

## 2015-06-25 DIAGNOSIS — M47818 Spondylosis without myelopathy or radiculopathy, sacral and sacrococcygeal region: Secondary | ICD-10-CM

## 2015-06-25 DIAGNOSIS — G43109 Migraine with aura, not intractable, without status migrainosus: Secondary | ICD-10-CM

## 2015-06-25 DIAGNOSIS — M47817 Spondylosis without myelopathy or radiculopathy, lumbosacral region: Secondary | ICD-10-CM | POA: Diagnosis not present

## 2015-06-25 DIAGNOSIS — M170 Bilateral primary osteoarthritis of knee: Secondary | ICD-10-CM

## 2015-06-25 DIAGNOSIS — M7061 Trochanteric bursitis, right hip: Secondary | ICD-10-CM

## 2015-06-25 DIAGNOSIS — M7062 Trochanteric bursitis, left hip: Secondary | ICD-10-CM

## 2015-06-25 DIAGNOSIS — M5416 Radiculopathy, lumbar region: Secondary | ICD-10-CM | POA: Diagnosis not present

## 2015-06-25 MED ORDER — TRAMADOL HCL 50 MG PO TABS: ORAL_TABLET | ORAL | Status: DC

## 2015-06-25 MED ORDER — HYDROCODONE-ACETAMINOPHEN 7.5-325 MG PO TABS: ORAL_TABLET | ORAL | Status: DC

## 2015-06-25 NOTE — Progress Notes (Signed)
Subjective:    Patient ID: Bethany Scott, female    DOB: 1955/06/09, 60 y.o.   MRN: [id number]  HPI  The patient is a 60 year old female who returns to pain management for further evaluation and treatment of pain involving the lower back lower extremity region. On today's visit the patient also stated that she has had significant headaches with pain occurring the back of the neck radiating to the back of the head and continuing to the retro-orbital region. The patient is with history of migraine headaches and there is concern regarding bilateral occipital neuralgia contributing to patient's headaches as well. We discussed patient's condition and will consider patient for occipital nerve blocks to be performed at time return appointment in attempt to decrease severity of headaches, minimize progression of headaches, and avoid need for more involved treatment. The patient is also with lower back lower extremity pain with pain occurring in the hip region buttocks and lower extremity region. We will consider patient for additional treatment of the lumbar lower extremity region pending follow-up evaluations. Patient is to call pain management prior to scheduled return appointment should there be significant change in condition. All agreed to suggested treatment plan. The patient will continue tramadol and hydrocodone as prescribed this time and we will proceed with bilateral occipital nerve blocks to be performed at time return appointment. All agreed to suggested treatment plan  Review of Systems     Objective:   Physical Exam  There was tenderness of the splenius capitis and occipitalis regions palpation which reproduces severe discomfort. No masses of the head and neck were noted. There were no bounding pulsations of the temporal region noted. Palpation of the region of the cervical facet cervical paraspinal musculature region reproduced moderate discomfort. There was tenderness over the region of the  thoracic paraspinal musculature and thoracic facet region of moderate degree with moderate muscle spasms noted in both the cervical and thoracic paraspinal musculature regions. There was tenderness of the acromioclavicular and glenohumeral joint regions of mild to moderate degree and patient appeared to be with unremarkable Spurling's maneuver. Patient appeared to be with bilaterally equal grip strength and Tinel and Phalen's maneuver reproducing mild discomfort. Palpation over the lumbar paraspinal musculatures and lumbar facet region was attends to palpation of moderate degree with lateral bending rotation extension and palpation of the lumbar facets reproducing moderate discomfort. There was tenderness over the PSIS and PII S region as well as the gluteal and piriformis musculature region and the greater trochanteric region all of moderate degree. Straight leg raising was tolerates approximately 30 without increased pain with dorsiflexion noted. EHL 10th appeared to be decreased. There was negative clonus negative Homans. No sensory deficit or dermatomal dystrophy detected. Abdomen nontender with no costovertebral tenderness noted             Assessment & Plan:    Bilateral occipital neuralgia  Migraine headaches  Degenerative joint disease of hips  Degenerative disc disease lumbar spine  Lumbar facet syndrome  Sacroiliac joint dysfunction   Sacroiliitis  Degenerative joint disease of knees  Degenerative disc disease of spine  Greater trochanteric bursitis       PLAN  Continue present medications tramadol and hydrocodone acetaminophen IMPORTANT   NO VOLTAREN IF YOU TAKE MOBIC AND NO IBUPROFEN OR SIMILAR MEDICATIONS as previously discussed   NO NEURONTIN. Patient did not tolerate Neurontin. Patient has taken Topamax in the past. May reconsider Topamax as discussed  Greater occipital nerve block to be performed at time  of return appointment  F/U PCP Dr. Tamala Julian for  evaliation of  BP and general medical  condition  F/U surgical evaluation. May be considered for further evaluation as discussed  F/U neurological evaluation. May be considered for further evaluation as discussed May consider PNCV/EMG studies and other studies as well as studies to evaluate headache. We will avoid such studies at this time  May consider radiofrequency rhizolysis or intraspinal procedures pending response to present treatment and F/U evaluation Ask nurses for information regarding radiofrequency procedures and also asked the secretary and Angie to let you know when insurance approved you for radiofrequency lumbar facets right side as we previously discussed  Patient to call [facility] Management Center should patient have concerns prior to scheduled return appointment

## 2015-06-25 NOTE — Progress Notes (Signed)
Safety precautions to be maintained throughout the outpatient stay will include: orient to surroundings, keep bed in low position, maintain call bell within reach at all times, provide assistance with transfer out of bed and ambulation.  

## 2015-06-25 NOTE — Patient Instructions (Addendum)
PLAN  Continue present medications tramadol and hydrocodone acetaminophen IMPORTANT   NO VOLTAREN IF YOU TAKE MOBIC AND NO IBUPROFEN OR SIMILAR MEDICATIONS as previously discussed   NO NEURONTIN. Patient did not tolerate Neurontin. Patient has taken Topamax in the past. May reconsider Topamax as discussed  Greater occipital nerve block to be performed at time of return appointment  F/U PCP Dr. Tamala Julian for evaliation of  BP and general medical  condition  F/U surgical evaluation. May be considered for further evaluation as discussed  F/U neurological evaluation. May be considered for further evaluation as discussed May consider PNCV/EMG studies and other studies as well as studies to evaluate headache. We will avoid such studies at this time  May consider radiofrequency rhizolysis or intraspinal procedures pending response to present treatment and F/U evaluation Ask nurses for information regarding radiofrequency procedures and also asked the secretary and Angie to let you know when insurance approved you for radiofrequency lumbar facets right side as we previously discussed  Patient to call [facility] should patient have concerns prior to scheduled return appointmenOccipital Nerve Block Patient Information  Description: The occipital nerves originate in the cervical (neck) spinal cord and travel upward through muscle and tissue to supply sensation to the back of the head and top of the scalp.  In addition, the nerves control some of the muscles of the scalp.  Occipital neuralgia is an irritation of these nerves which can cause headaches, numbness of the scalp, and neck discomfort.     The occipital nerve block will interrupt nerve transmission through these nerves and can relieve pain and spasm.  The block consists of insertion of a small needle under the skin in the back of the head to deposit local anesthetic (numbing medicine) and/or steroids around the nerve.  The entire block usually  lasts less than 5 minutes.  Conditions which may be treated by occipital blocks:   Muscular pain and spasm of the scalp  Nerve irritation, back of the head  Headaches  Upper neck pain  Preparation for the injection:  1. Do not eat any solid food or dairy products within 8 hours of your appointment. 2. You may drink clear liquids up to 3 hours before appointment.  Clear liquids include water, black coffee, juice or soda.  No milk or cream please. 3. You may take your regular medication, including pain medications, with a sip of water before you appointment.  Diabetics should hold regular insulin (if taken separately) and take 1/2 normal NPH dose the morning of the procedure.  Carry some sugar containing items with you to your appointment. 4. A driver must accompany you and be prepared to drive you home after your procedure. 5. Bring all your current medications with you. 6. An IV may be inserted and sedation may be given at the discretion of the physician. 7. A blood pressure cuff, EKG, and other monitors will often be applied during the procedure.  Some patients may need to have extra oxygen administered for a short period. 8. You will be asked to provide medical information, including your allergies and medications, prior to the procedure.  We must know immediately if you are taking blood thinners (like Coumadin/Warfarin) or if you are allergic to IV iodine contrast (dye).  We must know if you could possible be pregnant.  9. Do not wear a high collared shirt or turtleneck.  Tie long hair up in the back if possible.  Possible side-effects:   Bleeding from needle site  Infection (rare, may require surgery)  Nerve injury (rare)  Hair on back of neck can be tinged with iodine scrub (this will wash out)  Light-headedness (temporary)  Pain at injection site (several days)  Decreased blood pressure (rare, temporary)  Seizure (very rare)  Call if you experience:   Hives or  difficulty breathing ( go to the emergency room)  Inflammation or drainage at the injection site(s)  Please note:  Although the local anesthetic injected can often make your painful muscles or headache feel good for several hours after the injection, the pain may return.  It takes 3-7 days for steroids to work.  You may not notice any pain relief for at least one week.  If effective, we will often do a series of injections spaced 3-6 weeks apart to maximally decrease your pain.  If you have any questions, please call [phone] [place]  What are the risk, side effects and possible complications? Generally speaking, most procedures are safe.  However, with any procedure there are risks, side effects, and the possibility of complications.  The risks and complications are dependent upon the sites that are lesioned, or the type of nerve block to be performed.  The closer the procedure is to the spine, the more serious the risks are.  Great care is taken when placing the radio frequency needles, block needles or lesioning probes, but sometimes complications can occur. 1. Infection: Any time there is an injection through the skin, there is a risk of infection.  This is why sterile conditions are used for these blocks.  There are four possible types of infection. 1. Localized skin infection. 2. Central Nervous System Infection-This can be in the form of Meningitis, which can be deadly. 3. Epidural Infections-This can be in the form of an epidural abscess, which can cause pressure inside of the spine, causing compression of the spinal cord with subsequent paralysis. This would require an emergency surgery to decompress, and there are no guarantees that the patient would recover from the paralysis. 4. Discitis-This is an infection of the intervertebral discs.  It occurs in about 1% of discography procedures.  It is difficult to treat  and it may lead to surgery.        2. Pain: the needles have to go through skin and soft tissues, will cause soreness.       3. Damage to internal structures:  The nerves to be lesioned may be near blood vessels or    other nerves which can be potentially damaged.       4. Bleeding: Bleeding is more common if the patient is taking blood thinners such as  aspirin, Coumadin, Ticiid, Plavix, etc., or if he/she have some genetic predisposition  such as hemophilia. Bleeding into the spinal canal can cause compression of the spinal  cord with subsequent paralysis.  This would require an emergency surgery to  decompress and there are no guarantees that the patient would recover from the  paralysis.       5. Pneumothorax:  Puncturing of a lung is a possibility, every time a needle is introduced in  the area of the chest or upper back.  Pneumothorax refers to free air around the  collapsed lung(s), inside of the thoracic cavity (chest cavity).  Another two possible  complications related to a similar event would include: Hemothorax and Chylothorax.   These are variations of the Pneumothorax, where instead of air around  the collapsed  lung(s), you may have blood or chyle, respectively.       6. Spinal headaches: They may occur with any procedures in the area of the spine.       7. Persistent CSF (Cerebro-Spinal Fluid) leakage: This is a rare problem, but may occur  with prolonged intrathecal or epidural catheters either due to the formation of a fistulous  track or a dural tear.       8. Nerve damage: By working so close to the spinal cord, there is always a possibility of  nerve damage, which could be as serious as a permanent spinal cord injury with  paralysis.       9. Death:  Although rare, severe deadly allergic reactions known as "Anaphylactic  reaction" can occur to any of the medications used.      10. Worsening of the symptoms:  We can always make thing worse.  What are the chances of something like this  happening? Chances of any of this occuring are extremely low.  By statistics, you have more of a chance of getting killed in a motor vehicle accident: while driving to the hospital than any of the above occurring .  Nevertheless, you should be aware that they are possibilities.  In general, it is similar to taking a shower.  Everybody knows that you can slip, hit your head and get killed.  Does that mean that you should not shower again?  Nevertheless always keep in mind that statistics do not mean anything if you happen to be on the wrong side of them.  Even if a procedure has a 1 (one) in a 1,000,000 (million) chance of going wrong, it you happen to be that one..Also, keep in mind that by statistics, you have more of a chance of having something go wrong when taking medications.  Who should not have this procedure? If you are on a blood thinning medication (e.g. Coumadin, Plavix, see list of "Blood Thinners"), or if you have an active infection going on, you should not have the procedure.  If you are taking any blood thinners, please inform your physician.  How should I prepare for this procedure?  Do not eat or drink anything at least six hours prior to the procedure.  Bring a driver with you .  It cannot be a taxi.  Come accompanied by an adult that can drive you back, and that is strong enough to help you if your legs get weak or numb from the local anesthetic.  Take all of your medicines the morning of the procedure with just enough water to swallow them.  If you have diabetes, make sure that you are scheduled to have your procedure done first thing in the morning, whenever possible.  If you have diabetes, take only half of your insulin dose and notify our nurse that you have done so as soon as you arrive at the clinic.  If you are diabetic, but only take blood sugar pills (oral hypoglycemic), then do not take them on the morning of your procedure.  You may take them after you have had the  procedure.  Do not take aspirin or any aspirin-containing medications, at least eleven (11) days prior to the procedure.  They may prolong bleeding.  Wear loose fitting clothing that may be easy to take off and that you would not mind if it got stained with Betadine or blood.  Do not wear any jewelry or perfume  Remove any nail  coloring.  It will interfere with some of our monitoring equipment.  NOTE: Remember that this is not meant to be interpreted as a complete list of all possible complications.  Unforeseen problems may occur.  BLOOD THINNERS The following drugs contain aspirin or other products, which can cause increased bleeding during surgery and should not be taken for 2 weeks prior to and 1 week after surgery.  If you should need take something for relief of minor pain, you may take acetaminophen which is found in Tylenol,m Datril, Anacin-3 and Panadol. It is not blood thinner. The products listed below are.  Do not take any of the products listed below in addition to any listed on your instruction sheet.  A.P.C or A.P.C with Codeine Codeine Phosphate Capsules #3 Ibuprofen Ridaura  ABC compound Congesprin Imuran rimadil  Advil Cope Indocin Robaxisal  Alka-Seltzer Effervescent Pain Reliever and Antacid Coricidin or Coricidin-D  Indomethacin Rufen  Alka-Seltzer plus Cold Medicine Cosprin Ketoprofen S-A-C Tablets  Anacin Analgesic Tablets or Capsules Coumadin Korlgesic Salflex  Anacin Extra Strength Analgesic tablets or capsules CP-2 Tablets Lanoril Salicylate  Anaprox Cuprimine Capsules Levenox Salocol  Anexsia-D Dalteparin Magan Salsalate  Anodynos Darvon compound Magnesium Salicylate Sine-off  Ansaid Dasin Capsules Magsal Sodium Salicylate  Anturane Depen Capsules Marnal Soma  APF Arthritis pain formula Dewitt's Pills Measurin Stanback  Argesic Dia-Gesic Meclofenamic Sulfinpyrazone  Arthritis Bayer Timed Release Aspirin Diclofenac Meclomen Sulindac  Arthritis pain formula  Anacin Dicumarol Medipren Supac  Analgesic (Safety coated) Arthralgen Diffunasal Mefanamic Suprofen  Arthritis Strength Bufferin Dihydrocodeine Mepro Compound Suprol  Arthropan liquid Dopirydamole Methcarbomol with Aspirin Synalgos  ASA tablets/Enseals Disalcid Micrainin Tagament  Ascriptin Doan's Midol Talwin  Ascriptin A/D Dolene Mobidin Tanderil  Ascriptin Extra Strength Dolobid Moblgesic Ticlid  Ascriptin with Codeine Doloprin or Doloprin with Codeine Momentum Tolectin  Asperbuf Duoprin Mono-gesic Trendar  Aspergum Duradyne Motrin or Motrin IB Triminicin  Aspirin plain, buffered or enteric coated Durasal Myochrisine Trigesic  Aspirin Suppositories Easprin Nalfon Trillsate  Aspirin with Codeine Ecotrin Regular or Extra Strength Naprosyn Uracel  Atromid-S Efficin Naproxen Ursinus  Auranofin Capsules Elmiron Neocylate Vanquish  Axotal Emagrin Norgesic Verin  Azathioprine Empirin or Empirin with Codeine Normiflo Vitamin E  Azolid Emprazil Nuprin Voltaren  Bayer Aspirin plain, buffered or children's or timed BC Tablets or powders Encaprin Orgaran Warfarin Sodium  Buff-a-Comp Enoxaparin Orudis Zorpin  Buff-a-Comp with Codeine Equegesic Os-Cal-Gesic   Buffaprin Excedrin plain, buffered or Extra Strength Oxalid   Bufferin Arthritis Strength Feldene Oxphenbutazone   Bufferin plain or Extra Strength Feldene Capsules Oxycodone with Aspirin   Bufferin with Codeine Fenoprofen Fenoprofen Pabalate or Pabalate-SF   Buffets II Flogesic Panagesic   Buffinol plain or Extra Strength Florinal or Florinal with Codeine Panwarfarin   Buf-Tabs Flurbiprofen Penicillamine   Butalbital Compound Four-way cold tablets Penicillin   Butazolidin Fragmin Pepto-Bismol   Carbenicillin Geminisyn Percodan   Carna Arthritis Reliever Geopen Persantine   Carprofen Gold's salt Persistin   Chloramphenicol Goody's Phenylbutazone   Chloromycetin Haltrain Piroxlcam   Clmetidine heparin Plaquenil   Cllnoril Hyco-pap  Ponstel   Clofibrate Hydroxy chloroquine Propoxyphen         Before stopping any of these medications, be sure to consult the physician who ordered them.  Some, such as Coumadin (Warfarin) are ordered to prevent or treat serious conditions such as "deep thrombosis", "pumonary embolisms", and other heart problems.  The amount of time that you may need off of the medication may also vary with the medication and the reason for  which you were taking it.  If you are taking any of these medications, please make sure you notify your pain physician before you undergo any procedures.

## 2015-07-07 ENCOUNTER — Telehealth: Payer: Self-pay | Admitting: *Deleted

## 2015-07-07 NOTE — Telephone Encounter (Signed)
pt has a work conflict and can not take off. she stated she will call back to r/s her procedure.Marland KitchenMarland KitchenTD

## 2015-07-16 ENCOUNTER — Ambulatory Visit: Payer: 59 | Admitting: Pain Medicine

## 2015-07-18 DIAGNOSIS — H40013 Open angle with borderline findings, low risk, bilateral: Secondary | ICD-10-CM | POA: Diagnosis not present

## 2015-07-20 ENCOUNTER — Encounter: Payer: Self-pay | Admitting: Family Medicine

## 2015-07-21 ENCOUNTER — Ambulatory Visit: Payer: 59 | Attending: Pain Medicine | Admitting: Pain Medicine

## 2015-07-21 ENCOUNTER — Encounter: Payer: Self-pay | Admitting: Pain Medicine

## 2015-07-21 VITALS — BP 118/71 | HR 73 | Temp 97.9°F | Resp 16 | Ht 67.0 in | Wt 232.0 lb

## 2015-07-21 DIAGNOSIS — G43909 Migraine, unspecified, not intractable, without status migrainosus: Secondary | ICD-10-CM | POA: Diagnosis not present

## 2015-07-21 DIAGNOSIS — M7061 Trochanteric bursitis, right hip: Secondary | ICD-10-CM

## 2015-07-21 DIAGNOSIS — M170 Bilateral primary osteoarthritis of knee: Secondary | ICD-10-CM | POA: Diagnosis not present

## 2015-07-21 DIAGNOSIS — M160 Bilateral primary osteoarthritis of hip: Secondary | ICD-10-CM

## 2015-07-21 DIAGNOSIS — M47816 Spondylosis without myelopathy or radiculopathy, lumbar region: Secondary | ICD-10-CM

## 2015-07-21 DIAGNOSIS — M7062 Trochanteric bursitis, left hip: Secondary | ICD-10-CM

## 2015-07-21 DIAGNOSIS — M461 Sacroiliitis, not elsewhere classified: Secondary | ICD-10-CM | POA: Diagnosis not present

## 2015-07-21 DIAGNOSIS — M79606 Pain in leg, unspecified: Secondary | ICD-10-CM | POA: Diagnosis present

## 2015-07-21 DIAGNOSIS — M1610 Unilateral primary osteoarthritis, unspecified hip: Secondary | ICD-10-CM

## 2015-07-21 DIAGNOSIS — M25559 Pain in unspecified hip: Secondary | ICD-10-CM

## 2015-07-21 DIAGNOSIS — M5481 Occipital neuralgia: Secondary | ICD-10-CM

## 2015-07-21 DIAGNOSIS — M533 Sacrococcygeal disorders, not elsewhere classified: Secondary | ICD-10-CM | POA: Insufficient documentation

## 2015-07-21 DIAGNOSIS — M5416 Radiculopathy, lumbar region: Secondary | ICD-10-CM | POA: Diagnosis not present

## 2015-07-21 DIAGNOSIS — M791 Myalgia: Secondary | ICD-10-CM | POA: Diagnosis not present

## 2015-07-21 DIAGNOSIS — M47818 Spondylosis without myelopathy or radiculopathy, sacral and sacrococcygeal region: Secondary | ICD-10-CM

## 2015-07-21 DIAGNOSIS — M25569 Pain in unspecified knee: Secondary | ICD-10-CM

## 2015-07-21 DIAGNOSIS — M545 Low back pain: Secondary | ICD-10-CM | POA: Diagnosis present

## 2015-07-21 DIAGNOSIS — M47817 Spondylosis without myelopathy or radiculopathy, lumbosacral region: Secondary | ICD-10-CM | POA: Diagnosis not present

## 2015-07-21 DIAGNOSIS — M7060 Trochanteric bursitis, unspecified hip: Secondary | ICD-10-CM | POA: Insufficient documentation

## 2015-07-21 DIAGNOSIS — M5136 Other intervertebral disc degeneration, lumbar region: Secondary | ICD-10-CM | POA: Diagnosis not present

## 2015-07-21 DIAGNOSIS — G43109 Migraine with aura, not intractable, without status migrainosus: Secondary | ICD-10-CM

## 2015-07-21 DIAGNOSIS — M51369 Other intervertebral disc degeneration, lumbar region without mention of lumbar back pain or lower extremity pain: Secondary | ICD-10-CM

## 2015-07-21 MED ORDER — HYDROCODONE-ACETAMINOPHEN 7.5-325 MG PO TABS: ORAL_TABLET | ORAL | Status: DC

## 2015-07-21 MED ORDER — TRAMADOL HCL 50 MG PO TABS: ORAL_TABLET | ORAL | Status: DC

## 2015-07-21 NOTE — Patient Instructions (Addendum)
PLAN  Continue present medications tramadol and hydrocodone acetaminophen IMPORTANT   NO VOLTAREN IF YOU TAKE MOBIC AND NO IBUPROFEN OR SIMILAR MEDICATIONS as previously discussed   NO NEURONTIN. Patient did not tolerate Neurontin. Patient has taken Topamax in the past. May reconsider Topamax as discussed  Greater trochanteric bursa injection to be performed at time of return appointment  F/U PCP Dr. Tamala Julian for evaluation of  BP and general medical  condition  F/U surgical evaluation. May be considered for further evaluation as discussed  F/U neurological evaluation. May be considered for further evaluation as discussed May consider PNCV/EMG studies and other studies as well as studies to evaluate headache.  May consider radiofrequency rhizolysis or intraspinal procedures pending response to present treatment and F/U evaluation   Patient to call Pain Management Center should patient have concerns prior to scheduled return appointmentGENERAL RISKS AND COMPLICATIONS  What are the risk, side effects and possible complications? Generally speaking, most procedures are safe.  However, with any procedure there are risks, side effects, and the possibility of complications.  The risks and complications are dependent upon the sites that are lesioned, or the type of nerve block to be performed.  The closer the procedure is to the spine, the more serious the risks are.  Great care is taken when placing the radio frequency needles, block needles or lesioning probes, but sometimes complications can occur. 1. Infection: Any time there is an injection through the skin, there is a risk of infection.  This is why sterile conditions are used for these blocks.  There are four possible types of infection. 1. Localized skin infection. 2. Central Nervous System Infection-This can be in the form of Meningitis, which can be deadly. 3. Epidural Infections-This can be in the form of an epidural abscess, which can cause  pressure inside of the spine, causing compression of the spinal cord with subsequent paralysis. This would require an emergency surgery to decompress, and there are no guarantees that the patient would recover from the paralysis. 4. Discitis-This is an infection of the intervertebral discs.  It occurs in about 1% of discography procedures.  It is difficult to treat and it may lead to surgery.        2. Pain: the needles have to go through skin and soft tissues, will cause soreness.       3. Damage to internal structures:  The nerves to be lesioned may be near blood vessels or    other nerves which can be potentially damaged.       4. Bleeding: Bleeding is more common if the patient is taking blood thinners such as  aspirin, Coumadin, Ticiid, Plavix, etc., or if he/she have some genetic predisposition  such as hemophilia. Bleeding into the spinal canal can cause compression of the spinal  cord with subsequent paralysis.  This would require an emergency surgery to  decompress and there are no guarantees that the patient would recover from the  paralysis.       5. Pneumothorax:  Puncturing of a lung is a possibility, every time a needle is introduced in  the area of the chest or upper back.  Pneumothorax refers to free air around the  collapsed lung(s), inside of the thoracic cavity (chest cavity).  Another two possible  complications related to a similar event would include: Hemothorax and Chylothorax.   These are variations of the Pneumothorax, where instead of air around the collapsed  lung(s), you may have blood or chyle, respectively.  6. Spinal headaches: They may occur with any procedures in the area of the spine.       7. Persistent CSF (Cerebro-Spinal Fluid) leakage: This is a rare problem, but may occur  with prolonged intrathecal or epidural catheters either due to the formation of a fistulous  track or a dural tear.       8. Nerve damage: By working so close to the spinal cord, there is  always a possibility of  nerve damage, which could be as serious as a permanent spinal cord injury with  paralysis.       9. Death:  Although rare, severe deadly allergic reactions known as "Anaphylactic  reaction" can occur to any of the medications used.      10. Worsening of the symptoms:  We can always make thing worse.  What are the chances of something like this happening? Chances of any of this occuring are extremely low.  By statistics, you have more of a chance of getting killed in a motor vehicle accident: while driving to the hospital than any of the above occurring .  Nevertheless, you should be aware that they are possibilities.  In general, it is similar to taking a shower.  Everybody knows that you can slip, hit your head and get killed.  Does that mean that you should not shower again?  Nevertheless always keep in mind that statistics do not mean anything if you happen to be on the wrong side of them.  Even if a procedure has a 1 (one) in a 1,000,000 (million) chance of going wrong, it you happen to be that one..Also, keep in mind that by statistics, you have more of a chance of having something go wrong when taking medications.  Who should not have this procedure? If you are on a blood thinning medication (e.g. Coumadin, Plavix, see list of "Blood Thinners"), or if you have an active infection going on, you should not have the procedure.  If you are taking any blood thinners, please inform your physician.  How should I prepare for this procedure?  Do not eat or drink anything at least six hours prior to the procedure.  Bring a driver with you .  It cannot be a taxi.  Come accompanied by an adult that can drive you back, and that is strong enough to help you if your legs get weak or numb from the local anesthetic.  Take all of your medicines the morning of the procedure with just enough water to swallow them.  If you have diabetes, make sure that you are scheduled to have your  procedure done first thing in the morning, whenever possible.  If you have diabetes, take only half of your insulin dose and notify our nurse that you have done so as soon as you arrive at the clinic.  If you are diabetic, but only take blood sugar pills (oral hypoglycemic), then do not take them on the morning of your procedure.  You may take them after you have had the procedure.  Do not take aspirin or any aspirin-containing medications, at least eleven (11) days prior to the procedure.  They may prolong bleeding.  Wear loose fitting clothing that may be easy to take off and that you would not mind if it got stained with Betadine or blood.  Do not wear any jewelry or perfume  Remove any nail coloring.  It will interfere with some of our monitoring equipment.  NOTE: Remember that this is  not meant to be interpreted as a complete list of all possible complications.  Unforeseen problems may occur.  BLOOD THINNERS The following drugs contain aspirin or other products, which can cause increased bleeding during surgery and should not be taken for 2 weeks prior to and 1 week after surgery.  If you should need take something for relief of minor pain, you may take acetaminophen which is found in Tylenol,m Datril, Anacin-3 and Panadol. It is not blood thinner. The products listed below are.  Do not take any of the products listed below in addition to any listed on your instruction sheet.  A.P.C or A.P.C with Codeine Codeine Phosphate Capsules #3 Ibuprofen Ridaura  ABC compound Congesprin Imuran rimadil  Advil Cope Indocin Robaxisal  Alka-Seltzer Effervescent Pain Reliever and Antacid Coricidin or Coricidin-D  Indomethacin Rufen  Alka-Seltzer plus Cold Medicine Cosprin Ketoprofen S-A-C Tablets  Anacin Analgesic Tablets or Capsules Coumadin Korlgesic Salflex  Anacin Extra Strength Analgesic tablets or capsules CP-2 Tablets Lanoril Salicylate  Anaprox Cuprimine Capsules Levenox Salocol  Anexsia-D  Dalteparin Magan Salsalate  Anodynos Darvon compound Magnesium Salicylate Sine-off  Ansaid Dasin Capsules Magsal Sodium Salicylate  Anturane Depen Capsules Marnal Soma  APF Arthritis pain formula Dewitt's Pills Measurin Stanback  Argesic Dia-Gesic Meclofenamic Sulfinpyrazone  Arthritis Bayer Timed Release Aspirin Diclofenac Meclomen Sulindac  Arthritis pain formula Anacin Dicumarol Medipren Supac  Analgesic (Safety coated) Arthralgen Diffunasal Mefanamic Suprofen  Arthritis Strength Bufferin Dihydrocodeine Mepro Compound Suprol  Arthropan liquid Dopirydamole Methcarbomol with Aspirin Synalgos  ASA tablets/Enseals Disalcid Micrainin Tagament  Ascriptin Doan's Midol Talwin  Ascriptin A/D Dolene Mobidin Tanderil  Ascriptin Extra Strength Dolobid Moblgesic Ticlid  Ascriptin with Codeine Doloprin or Doloprin with Codeine Momentum Tolectin  Asperbuf Duoprin Mono-gesic Trendar  Aspergum Duradyne Motrin or Motrin IB Triminicin  Aspirin plain, buffered or enteric coated Durasal Myochrisine Trigesic  Aspirin Suppositories Easprin Nalfon Trillsate  Aspirin with Codeine Ecotrin Regular or Extra Strength Naprosyn Uracel  Atromid-S Efficin Naproxen Ursinus  Auranofin Capsules Elmiron Neocylate Vanquish  Axotal Emagrin Norgesic Verin  Azathioprine Empirin or Empirin with Codeine Normiflo Vitamin E  Azolid Emprazil Nuprin Voltaren  Bayer Aspirin plain, buffered or children's or timed BC Tablets or powders Encaprin Orgaran Warfarin Sodium  Buff-a-Comp Enoxaparin Orudis Zorpin  Buff-a-Comp with Codeine Equegesic Os-Cal-Gesic   Buffaprin Excedrin plain, buffered or Extra Strength Oxalid   Bufferin Arthritis Strength Feldene Oxphenbutazone   Bufferin plain or Extra Strength Feldene Capsules Oxycodone with Aspirin   Bufferin with Codeine Fenoprofen Fenoprofen Pabalate or Pabalate-SF   Buffets II Flogesic Panagesic   Buffinol plain or Extra Strength Florinal or Florinal with Codeine Panwarfarin    Buf-Tabs Flurbiprofen Penicillamine   Butalbital Compound Four-way cold tablets Penicillin   Butazolidin Fragmin Pepto-Bismol   Carbenicillin Geminisyn Percodan   Carna Arthritis Reliever Geopen Persantine   Carprofen Gold's salt Persistin   Chloramphenicol Goody's Phenylbutazone   Chloromycetin Haltrain Piroxlcam   Clmetidine heparin Plaquenil   Cllnoril Hyco-pap Ponstel   Clofibrate Hydroxy chloroquine Propoxyphen         Before stopping any of these medications, be sure to consult the physician who ordered them.  Some, such as Coumadin (Warfarin) are ordered to prevent or treat serious conditions such as "deep thrombosis", "pumonary embolisms", and other heart problems.  The amount of time that you may need off of the medication may also vary with the medication and the reason for which you were taking it.  If you are taking any of these medications, please make sure  you notify your pain physician before you undergo any procedures.

## 2015-07-21 NOTE — Progress Notes (Signed)
Safety precautions to be maintained throughout the outpatient stay will include: orient to surroundings, keep bed in low position, maintain call bell within reach at all times, provide assistance with transfer out of bed and ambulation.  

## 2015-07-21 NOTE — Progress Notes (Signed)
Subjective:    Patient ID: Bethany Scott, female    DOB: 1955/11/22, 60 y.o.   MRN: [id number]  HPI  The patient is a 60 year old female who returns to pain management for further evaluation and treatment of pain which involves the lower back and lower extremity region predominantly. The patient is with pain involving the hips and knees as well and states that she has significant component of greater trochanteric bursitis contributing to her symptoms at this time. We discussed patient's condition and will continue medications consisting of tramadol and hydrocodone acetaminophen. We will proceed with greater trochanteric bursa injection at time of return appointment and we will remain available to consider additional modifications of treatment regimen pending response to treatment and follow-up evaluation. The patient is without known trauma or change in events of daily living the cost change in symptomatology of significant degree. The patient continues to work. We've advised patient to attempt to avoid traumatizing the greater trochanteric regions. The patient is making TO do such. We will proceed with greater trochanteric bursa injections at time return appointment and we will consider additional modifications of treatment regimen pending response to treatment and follow-up evaluation. The patient was without complaint of significant pain of the cervical region headaches upper back region on today's visit  Review of Systems     Objective:   Physical Exam  There was tenderness to palpation of the paraspinal musculature region of the cervical region cervical facet region palpation which reproduces mild discomfort. There were no bounding pulsations of the temporal region. Palpation over the cervical facet cervical paraspinal muscular joint regions reproduces mild discomfort the patient appeared to be with bilaterally equal grip strength with Tinel and Phalen's maneuver reproducing minimal discomfort.  There was tenderness of the acromioclavicular and glenohumeral joint region a mild degree and patient appeared to be unremarkable Spurling's maneuver. Palpation over the lumbar region lumbar facet region was with tenderness to palpation of moderate degree. Palpation of the greater trochanteric region reproduced moderate to moderately severe discomfort. There was moderate to moderately severe tenderness of the iliotibial band region is well. Palpation of the PSIS and PII S regions reproduce moderate discomfort. Straight leg raising was tolerates approximately 30 without a definite increased pain with dorsiflexion noted. EHL strength appeared to be slightly decreased. There was negative clonus and negative Homans. Crepitus of the knees was noted. No abdominal tends to palpation and no costovertebral tenderness was noted         Assessment & Plan:    Greater trochanteric bursitis  Bilateral occipital neuralgia  Migraine headaches  Degenerative joint disease of hips  Degenerative disc disease lumbar spine  Lumbar facet syndrome  Sacroiliac joint dysfunction   Sacroiliitis  Degenerative joint disease of knees  Degenerative disc disease of spine       PLAN  Continue present medications tramadol and hydrocodone acetaminophen IMPORTANT   NO VOLTAREN IF YOU TAKE MOBIC AND NO IBUPROFEN OR SIMILAR MEDICATIONS as previously discussed   NO NEURONTIN. Patient did not tolerate Neurontin. Patient has taken Topamax in the past. May reconsider Topamax as discussed  Greater trochanteric bursa injection to be performed at time of return appointment  F/U PCP Dr. Tamala Julian for evaluation of  BP and general medical  condition  F/U surgical evaluation. May be considered for further evaluation as discussed  F/U neurological evaluation. May be considered for further evaluation as discussed May consider PNCV/EMG studies and other studies as well as studies to evaluate headache.  May consider  radiofrequency rhizolysis or intraspinal procedures pending response to present treatment and F/U evaluation   Patient to call Pain Management Center should patient have concerns prior to scheduled return appointment

## 2015-07-24 ENCOUNTER — Encounter: Payer: 59 | Admitting: Pain Medicine

## 2015-07-31 ENCOUNTER — Encounter: Payer: Self-pay | Admitting: Family Medicine

## 2015-08-03 MED ORDER — PROMETHAZINE HCL 25 MG PO TABS: 25.0000 mg | ORAL_TABLET | Freq: Three times a day (TID) | ORAL | Status: DC | PRN

## 2015-08-07 DIAGNOSIS — K219 Gastro-esophageal reflux disease without esophagitis: Secondary | ICD-10-CM | POA: Diagnosis not present

## 2015-08-07 DIAGNOSIS — M479 Spondylosis, unspecified: Secondary | ICD-10-CM | POA: Diagnosis not present

## 2015-08-07 DIAGNOSIS — I10 Essential (primary) hypertension: Secondary | ICD-10-CM | POA: Diagnosis not present

## 2015-08-07 DIAGNOSIS — E785 Hyperlipidemia, unspecified: Secondary | ICD-10-CM | POA: Diagnosis not present

## 2015-08-07 DIAGNOSIS — E1169 Type 2 diabetes mellitus with other specified complication: Secondary | ICD-10-CM | POA: Diagnosis not present

## 2015-08-07 DIAGNOSIS — E669 Obesity, unspecified: Secondary | ICD-10-CM | POA: Diagnosis not present

## 2015-08-07 DIAGNOSIS — M160 Bilateral primary osteoarthritis of hip: Secondary | ICD-10-CM | POA: Diagnosis not present

## 2015-08-07 DIAGNOSIS — M170 Bilateral primary osteoarthritis of knee: Secondary | ICD-10-CM | POA: Diagnosis not present

## 2015-08-11 ENCOUNTER — Other Ambulatory Visit: Payer: Self-pay | Admitting: General Surgery

## 2015-08-11 ENCOUNTER — Other Ambulatory Visit (HOSPITAL_COMMUNITY): Payer: Self-pay | Admitting: General Surgery

## 2015-08-12 ENCOUNTER — Telehealth: Payer: Self-pay | Admitting: Cardiovascular Disease

## 2015-08-12 NOTE — Telephone Encounter (Signed)
Records received from [facility] for appt 09/19/15 given to [place] in medical records CN

## 2015-08-13 ENCOUNTER — Encounter: Payer: Self-pay | Admitting: Pain Medicine

## 2015-08-13 ENCOUNTER — Ambulatory Visit: Payer: 59 | Attending: Pain Medicine | Admitting: Pain Medicine

## 2015-08-13 VITALS — BP 119/71 | HR 74 | Temp 97.7°F | Resp 16 | Ht 67.0 in | Wt 232.0 lb

## 2015-08-13 DIAGNOSIS — M170 Bilateral primary osteoarthritis of knee: Secondary | ICD-10-CM

## 2015-08-13 DIAGNOSIS — M47816 Spondylosis without myelopathy or radiculopathy, lumbar region: Secondary | ICD-10-CM

## 2015-08-13 DIAGNOSIS — M461 Sacroiliitis, not elsewhere classified: Secondary | ICD-10-CM

## 2015-08-13 DIAGNOSIS — M47818 Spondylosis without myelopathy or radiculopathy, sacral and sacrococcygeal region: Secondary | ICD-10-CM

## 2015-08-13 DIAGNOSIS — M25559 Pain in unspecified hip: Secondary | ICD-10-CM

## 2015-08-13 DIAGNOSIS — M7062 Trochanteric bursitis, left hip: Secondary | ICD-10-CM | POA: Diagnosis not present

## 2015-08-13 DIAGNOSIS — M5481 Occipital neuralgia: Secondary | ICD-10-CM

## 2015-08-13 DIAGNOSIS — M160 Bilateral primary osteoarthritis of hip: Secondary | ICD-10-CM

## 2015-08-13 DIAGNOSIS — M7061 Trochanteric bursitis, right hip: Secondary | ICD-10-CM

## 2015-08-13 DIAGNOSIS — G43109 Migraine with aura, not intractable, without status migrainosus: Secondary | ICD-10-CM

## 2015-08-13 DIAGNOSIS — M5136 Other intervertebral disc degeneration, lumbar region: Secondary | ICD-10-CM

## 2015-08-13 DIAGNOSIS — M1610 Unilateral primary osteoarthritis, unspecified hip: Secondary | ICD-10-CM

## 2015-08-13 DIAGNOSIS — M169 Osteoarthritis of hip, unspecified: Secondary | ICD-10-CM | POA: Diagnosis not present

## 2015-08-13 DIAGNOSIS — M51369 Other intervertebral disc degeneration, lumbar region without mention of lumbar back pain or lower extremity pain: Secondary | ICD-10-CM

## 2015-08-13 DIAGNOSIS — M25569 Pain in unspecified knee: Secondary | ICD-10-CM

## 2015-08-13 MED ORDER — TRAMADOL HCL 50 MG PO TABS: ORAL_TABLET | ORAL | Status: DC

## 2015-08-13 MED ORDER — HYDROCODONE-ACETAMINOPHEN 7.5-325 MG PO TABS: ORAL_TABLET | ORAL | Status: DC

## 2015-08-13 MED ORDER — SODIUM CHLORIDE 0.9% FLUSH: 20.0000 mL | Freq: Once | INTRAVENOUS | Status: DC

## 2015-08-13 MED ORDER — BUPIVACAINE HCL (PF) 0.25 % IJ SOLN
30.0000 mL | Freq: Once | INTRAMUSCULAR | Status: AC
  Administered 2015-08-13: 30 mL
  Filled 2015-08-13: qty 30

## 2015-08-13 MED ORDER — TRIAMCINOLONE ACETONIDE 40 MG/ML IJ SUSP
40.0000 mg | Freq: Once | INTRAMUSCULAR | Status: AC
  Administered 2015-08-13: 40 mg
  Filled 2015-08-13: qty 1

## 2015-08-13 NOTE — Patient Instructions (Addendum)
PLAN  Continue present medications tramadol and hydrocodone acetaminophen IMPORTANT   NO VOLTAREN IF YOU TAKE MOBIC AND NO IBUPROFEN OR SIMILAR MEDICATIONS as previously discussed   NO NEURONTIN. Patient did not tolerate Neurontin. Patient has taken Topamax in the past. May reconsider Topamax as discussed previously  F/U PCP Dr. Tamala Julian for evaluation of  BP and general medical  condition  F/U surgical evaluation. May be considered for further evaluation as discussed  F/U neurological evaluation. May be considered for further evaluation as discussed May consider PNCV/EMG studies and other studies as well as studies to evaluate headache.  May consider radiofrequency rhizolysis or intraspinal procedures pending response to present treatment and F/U evaluation   Patient to call Pain Management Center should patient have concerns prior to scheduled return appointmentHip Bursitis Bursitis is a swelling and soreness (inflammation) of a fluid-filled sac (bursa). This sac overlies and protects the joints.  CAUSES   Injury.  Overuse of the muscles surrounding the joint.  Arthritis.  Gout.  Infection.  Cold weather.  Inadequate warm-up and conditioning prior to activities. The cause may not be known.  SYMPTOMS   Mild to severe irritation.  Tenderness and swelling over the outside of the hip.  Pain with motion of the hip.  If the bursa becomes infected, a fever may be present. Redness, tenderness, and warmth will develop over the hip. Symptoms usually lessen in 3 to 4 weeks with treatment, but can come back. TREATMENT If conservative treatment does not work, your caregiver may advise draining the bursa and injecting cortisone into the area. This may speed up the healing process. This may also be used as an initial treatment of choice. HOME CARE INSTRUCTIONS   Apply ice to the affected area for 15-20 minutes every 3 to 4 hours while awake for the first 2 days. Put the ice in a plastic  bag and place a towel between the bag of ice and your skin.  Rest the painful joint as much as possible, but continue to put the joint through a normal range of motion at least 4 times per day. When the pain lessens, begin normal, slow movements and usual activities to help prevent stiffness of the hip.  Only take over-the-counter or prescription medicines for pain, discomfort, or fever as directed by your caregiver.  Use crutches to limit weight bearing on the hip joint, if advised.  Elevate your painful hip to reduce swelling. Use pillows for propping and cushioning your legs and hips.  Gentle massage may provide comfort and decrease swelling. SEEK IMMEDIATE MEDICAL CARE IF:   Your pain increases even during treatment, or you are not improving.  You have a fever.  You have heat and inflammation over the involved bursa.  You have any other questions or concerns. MAKE SURE YOU:   Understand these instructions.  Will watch your condition.  Will get help right away if you are not doing well or get worse.   This information is not intended to replace advice given to you by your health care provider. Make sure you discuss any questions you have with your health care provider.   Document Released: 07/03/2001 Document Revised: 04/05/2011 Document Reviewed: 08/13/2014 Elsevier Interactive Patient Education 2016 [place]. Pain Management Discharge Instructions  General Discharge Instructions :  If you need to reach your doctor call: Monday-Friday 8:00 am - 4:00 pm at [phone] or toll free [phone].  After clinic hours [phone] to have operator reach doctor.  Bring all of your medication  bottles to all your appointments in the pain clinic.  To cancel or reschedule your appointment with Pain Management please remember to call 24 hours in advance to avoid a fee.  Refer to the educational materials which you have been given on: General Risks, I had my Procedure.  Discharge Instructions, Post Sedation.  Post Procedure Instructions:  The drugs you were given will stay in your system until tomorrow, so for the next 24 hours you should not drive, make any legal decisions or drink any alcoholic beverages.  You may eat anything you prefer, but it is better to start with liquids then soups and crackers, and gradually work up to solid foods.  Please notify your doctor immediately if you have any unusual bleeding, trouble breathing or pain that is not related to your normal pain.  Depending on the type of procedure that was done, some parts of your body may feel week and/or numb.  This usually clears up by tonight or the next day.  Walk with the use of an assistive device or accompanied by an adult for the 24 hours.  You may use ice on the affected area for the first 24 hours.  Put ice in a Ziploc bag and cover with a towel and place against area 15 minutes on 15 minutes off.  You may switch to heat after 24 hours.

## 2015-08-13 NOTE — Progress Notes (Signed)
Safety precautions to be maintained throughout the outpatient stay will include: orient to surroundings, keep bed in low position, maintain call bell within reach at all times, provide assistance with transfer out of bed and ambulation. ,s 

## 2015-08-13 NOTE — Progress Notes (Signed)
   Subjective:    Patient ID: Bethany Scott, female    DOB: 26-Nov-1955, 60 y.o.   MRN: [id number]  HPI                                                 LEFT AND RIGHT  GREATER TROCHANTERIC BURSA  INJECTIONS     The patient is a 60  -year-old female who returns to pain management for further evaluation and treatment of pain involving the greater trochanteric region. The patient is with  severely disabling pain reproduced  by palpation of  the greater  trochanteric region. I recommended with no sedation right calcaneus There is concern regarding the patient's pain being due to a significant component of greater trochanteric bursitis and iliotibial band syndrome. The risk, benefits, and expectations of the procedure were discussed with and explained to the patient who was with understanding and in agreement with the suggested treatment plan.       Description Of Procedure:  Left Greater Trochanteric Bursa Injection  The patient was positioned in the lateral decubitus position. EKG, blood pressure, pulse, and pulse oximetry monitors were all in place. Identification of landmarks for needle entry for the procedure was accomplished and Betadine prep of the proposed needle entry site was accomplished.  A 22-gauge needle was inserted and 5 cc of 0.25% bupivacaine with Kenalog was injected. The needle was removed. The needle was reinserted in the region of the greater trochanter and in additional 5 cc of 0.25% bupivacaine with Kenalog was injected for left greater trochanteric bursa injection. The needle was removed.    Description Of Procedure:  Right Greater Trochanteric Bursa Injection  The procedure was performed on the right side exactly as was performed on the left side utilizing the same technique.   The patient tolerated the procedure well  A total of 40 milligrams of Kenalog was utilized for the procedure     PLAN  Continue present medications tramadol and hydrocodone  acetaminophen   F/U PCP K Smith for evaliation of  BP and general medical  condition  F/U surgical evaluation. May consider pending follow-up evaluations  F/U neurological evaluation. May consider pending follow-up evaluations  May consider radiofrequency rhizolysis or intraspinal procedures pending response to present treatment and F/U evaluation   Patient to call Pain Management Center should patient have concerns prior to scheduled return appointment.    Review of Systems     Objective:   Physical Exam        Assessment & Plan:

## 2015-08-14 ENCOUNTER — Telehealth: Payer: Self-pay | Admitting: *Deleted

## 2015-08-14 NOTE — Telephone Encounter (Signed)
Spoke with patient denies any questions or concerns re; procedure on yesterday.  

## 2015-08-19 ENCOUNTER — Ambulatory Visit: Payer: 59 | Admitting: Pain Medicine

## 2015-08-28 DIAGNOSIS — I788 Other diseases of capillaries: Secondary | ICD-10-CM | POA: Diagnosis not present

## 2015-08-28 DIAGNOSIS — L821 Other seborrheic keratosis: Secondary | ICD-10-CM | POA: Diagnosis not present

## 2015-08-28 DIAGNOSIS — L820 Inflamed seborrheic keratosis: Secondary | ICD-10-CM | POA: Diagnosis not present

## 2015-08-29 ENCOUNTER — Encounter: Payer: Self-pay | Admitting: Dietician

## 2015-08-29 ENCOUNTER — Encounter: Payer: 59 | Attending: General Surgery | Admitting: Dietician

## 2015-08-29 DIAGNOSIS — I10 Essential (primary) hypertension: Secondary | ICD-10-CM | POA: Diagnosis not present

## 2015-08-29 DIAGNOSIS — Z9884 Bariatric surgery status: Secondary | ICD-10-CM | POA: Diagnosis not present

## 2015-08-29 DIAGNOSIS — Z713 Dietary counseling and surveillance: Secondary | ICD-10-CM | POA: Diagnosis not present

## 2015-08-29 DIAGNOSIS — M1990 Unspecified osteoarthritis, unspecified site: Secondary | ICD-10-CM | POA: Insufficient documentation

## 2015-08-29 DIAGNOSIS — Z6836 Body mass index (BMI) 36.0-36.9, adult: Secondary | ICD-10-CM | POA: Diagnosis not present

## 2015-08-29 DIAGNOSIS — E7800 Pure hypercholesterolemia, unspecified: Secondary | ICD-10-CM | POA: Insufficient documentation

## 2015-08-29 NOTE — Patient Instructions (Signed)
Continue to maintain or lose weight as instructed by your surgeon. Make healthy food choices. Avoid/limit concentrated sugars and fried foods. Keep fat/sugar in the single digits per serving on food labels. Practice chewing your food (aim for 30 chews or until applesauce consistency. Practice not drinking 15 minutes before, during and 30 minutes after each meal/snack. Avoid all carbonated beverages (soda, sparkling beverages). Avoid/limit caffeinated beverages (coffee, tea, energy drinks). Avoid all sugar-sweetened beverages. Consume 3 meals per day; eat every 3-5 hours Make a list of non-food related activities. Monitor and aim for 64-100 oz of fluid daily. Monitor protein intake (at least 60-80 grams daily). Look for a liquid protein source that contains > 15 gms protein and < 5 grams carbohydrate (ex. Shakes, drinks, shots).  Remember to call the Nutrition and Diabetes Education Services at [phone] once you have been given your surgery date in order to enroll in the Pre-Op Nutrition Class. You will need to attend this nutrition class 3-4 weeks prior to your surgery.

## 2015-08-29 NOTE — Progress Notes (Signed)
Nutrition and Diabetes Education [facility] [place], [place] [address]  Date: 08/29/15 Re: Bethany Scott DOB: 1955-02-19 MRN: [id number]   MD: Dr. [name] RD:   Karolee Stamps, RD        Diagnosis: morbid obesity;  Nutrition Assessment: Patient came for pre-bariatric nutrition evaluation visit. She states that she plans to have gastric bypass bariatric surgery.   Height: 67 in  Weight: 234.1 lbs   BMI: 36.6 IBW/ %IBW: 150 lbs Weight Loss Goal: 180 lbs  Medical History:hypertension, Type 2 DM, DJD, elevated cholesterol Medications/Supplements: lipitor, astelin, Qvar, zyrtec, vtiamin D, Iron, multivitamin,flexeril, cymbalta, norco, prinivil, singulair, macrobid, protonix, pyridium, phenergan, maxalt, ultram Previous Surgeries: appendectomy, partial colon removal  Drug Allergies: cephalosporins, penicillin Food Allergies:  none Alcohol Intake: none Tobacco Use: none Physical Activity:  Very limited due to back and joint pain. Weight History: Reports she has been overweight since childhood.Reports highest weight of 265 lbs. Weight in past year has been stable.  Diet/Weight Loss History: She has tried numerous diet such as Weight watchers and nutri-system but regains the weight.  Dietary Recall: Food and Fluid: B- Belvita bar L- Eats the meal brought in by pharmaceutical reps 4 days per week; is not eating the dessert.  D- grilled meat, starch such as rice or potatoes, green beans and fruit          Beverages: water, diet soda and sweet tea (2-3 cups per day). Dines out: 7 times per week  Psychosocial: Patient works in a medical office. She denies any history of uncontrollable binging or purging in the form of laxatives or vomiting. She states that she rarely eats due to stress.  Summary: Patient was instructed on and is aware of the following guideline/recommendations:  Pre and Post operative dietary guidelines  The need  for daily multi-vitamin, calcium, and B12 supplementation following surgery.  The post surgery diet is a high protein diet.  The importance of adequate fluid post surgery.   Alcohol intake and cigarette use are strongly discouraged after surgery.  That carbonated beverages must be eliminated 2 weeks prior to surgery and after surgery.  That planning pre op and post op meals is important.  Exercise is a needed adjunct for weight loss.    From a nutrition standpoint, I feel that  Bethany Scott               is a good candidate to continue the bariatric surgery process. She states she wants to alleviate some of the pain that the extra weight she is carrying contributes to as well as improve the quality of her life. "I have grandchildren and I want to be able to move and enjoy them".She states that her family is very supportive of the surgery.       The patient is requested to participate in pre and post-operative follow-up appointments (2-weeks pre-op, 2 weeks post-op, 8-weeks post-op, 3 months post-op, 40-months post op, and 1-year post-op). This is to promote his weight loss goals.  If you have any questions or need any further information regarding your patient, please feel free to contact me at [phone].  Sincerely,   Karolee Stamps, RD, LDN Registered Dietitian Nutrition and [place]: [phone] F: [email or web address]

## 2015-09-10 ENCOUNTER — Encounter: Payer: Self-pay | Admitting: Pain Medicine

## 2015-09-10 ENCOUNTER — Ambulatory Visit: Payer: 59 | Attending: Pain Medicine | Admitting: Pain Medicine

## 2015-09-10 VITALS — BP 127/65 | HR 92 | Temp 97.7°F | Resp 16

## 2015-09-10 DIAGNOSIS — M160 Bilateral primary osteoarthritis of hip: Secondary | ICD-10-CM | POA: Insufficient documentation

## 2015-09-10 DIAGNOSIS — M461 Sacroiliitis, not elsewhere classified: Secondary | ICD-10-CM | POA: Diagnosis not present

## 2015-09-10 DIAGNOSIS — M7061 Trochanteric bursitis, right hip: Secondary | ICD-10-CM

## 2015-09-10 DIAGNOSIS — M79605 Pain in left leg: Secondary | ICD-10-CM | POA: Diagnosis present

## 2015-09-10 DIAGNOSIS — M7062 Trochanteric bursitis, left hip: Secondary | ICD-10-CM

## 2015-09-10 DIAGNOSIS — M1610 Unilateral primary osteoarthritis, unspecified hip: Secondary | ICD-10-CM

## 2015-09-10 DIAGNOSIS — G43909 Migraine, unspecified, not intractable, without status migrainosus: Secondary | ICD-10-CM | POA: Insufficient documentation

## 2015-09-10 DIAGNOSIS — M169 Osteoarthritis of hip, unspecified: Secondary | ICD-10-CM | POA: Diagnosis not present

## 2015-09-10 DIAGNOSIS — M79604 Pain in right leg: Secondary | ICD-10-CM | POA: Diagnosis present

## 2015-09-10 DIAGNOSIS — M47818 Spondylosis without myelopathy or radiculopathy, sacral and sacrococcygeal region: Secondary | ICD-10-CM

## 2015-09-10 DIAGNOSIS — M7060 Trochanteric bursitis, unspecified hip: Secondary | ICD-10-CM | POA: Insufficient documentation

## 2015-09-10 DIAGNOSIS — M47816 Spondylosis without myelopathy or radiculopathy, lumbar region: Secondary | ICD-10-CM

## 2015-09-10 DIAGNOSIS — M533 Sacrococcygeal disorders, not elsewhere classified: Secondary | ICD-10-CM | POA: Insufficient documentation

## 2015-09-10 DIAGNOSIS — M170 Bilateral primary osteoarthritis of knee: Secondary | ICD-10-CM | POA: Insufficient documentation

## 2015-09-10 DIAGNOSIS — M5136 Other intervertebral disc degeneration, lumbar region: Secondary | ICD-10-CM | POA: Insufficient documentation

## 2015-09-10 DIAGNOSIS — M5416 Radiculopathy, lumbar region: Secondary | ICD-10-CM | POA: Diagnosis not present

## 2015-09-10 DIAGNOSIS — M5481 Occipital neuralgia: Secondary | ICD-10-CM | POA: Diagnosis not present

## 2015-09-10 DIAGNOSIS — M791 Myalgia: Secondary | ICD-10-CM | POA: Diagnosis not present

## 2015-09-10 DIAGNOSIS — M47817 Spondylosis without myelopathy or radiculopathy, lumbosacral region: Secondary | ICD-10-CM | POA: Diagnosis not present

## 2015-09-10 DIAGNOSIS — M545 Low back pain: Secondary | ICD-10-CM | POA: Diagnosis present

## 2015-09-10 MED ORDER — TRAMADOL HCL 50 MG PO TABS: ORAL_TABLET | ORAL | 0 refills | Status: DC

## 2015-09-10 MED ORDER — DICLOFENAC EPOLAMINE 1.3 % TD PTCH
1.0000 | MEDICATED_PATCH | Freq: Two times a day (BID) | TRANSDERMAL | Status: DC
  Filled 2015-09-10: qty 1

## 2015-09-10 MED ORDER — HYDROCODONE-ACETAMINOPHEN 7.5-325 MG PO TABS: ORAL_TABLET | ORAL | 0 refills | Status: DC

## 2015-09-10 NOTE — Patient Instructions (Addendum)
PLAN  Continue present medications  tramadol and hydrocodone acetaminophen IMPORTANT   NO VOLTAREN IF YOU TAKE MOBIC AND NO IBUPROFEN OR SIMILAR MEDICATIONS as previously discussed   NO NEURONTIN. Patient did not tolerate Neurontin. Patient has taken Topamax in the past. May reconsider Topamax as discussed BEGIN FLECTOR PATCH as discussed  F/U PCP Dr. Tamala Julian for evaluation of  BP and general medical  condition  F/U surgical evaluation. May be considered for further evaluation as discussed  F/U neurological evaluation. May be considered for further evaluation as discussed May consider PNCV/EMG studies and other studies as well as studies to evaluate headache.  May consider radiofrequency rhizolysis or intraspinal procedures pending response to present treatment and F/U evaluation   Patient to call Pain Management Center should patient have concerns prior to scheduled return appointment

## 2015-09-10 NOTE — Progress Notes (Signed)
       Patient is a 60 year old female who returns to pain management for further evaluation and treatment of pain involving the region of the lower back and lower extremity regions predominantly. The patient has had pain involving the hips and greater trochanteric region as well. At the present time patient appears to be with pain fairly well-controlled. The patient does admit to the greater trochanteric bursitis interfering with activities of daily living to significant degree at times. We will prescribe Flector patch and we'll continue tramadol and hydrocodone acetaminophen as prescribed at this time. All agreed to suggested treatment plan. The patient denied any trauma change in events of daily living the call significant change in symptomatology.    Physical examination  There was tenderness to palpation of the paraspinal muscular treat the cervical region cervical facet region a mild degree with mild tenderness of the splenius capitis and occipitalis region. There was mild tenderness of the cervical facet region as well as. Palpation of the acromioclavicular and glenohumeral joint regions reproduce mild discomfort and patient was with unremarkable drop test. The patient appeared to be with unremarkable Spurling's maneuver as well. Palpation over the region of the thoracic region was attends to palpation of the lower thoracic region a moderate degree with no crepitus of the thoracic region noted. Palpation over the lumbar paraspinal musculature region lumbar facet region was attends to palpation of moderate degree with lateral bending rotation extension and palpation over the lumbar facets reproducing moderate discomfort. Palpation over the PSIS and PII S region reproduced moderate to moderately severe discomfort as well as moderate to moderately severe tenderness of the greater trochanteric region iliotibial band region. Straight leg raising was tolerates approximately 20 without increased pain  with dorsiflexion noted. The knees were attends to palpation with negative anterior and posterior drawer signs without ballottement of the patella. EHL strength appeared to be equal without sensory deficit of dermatomal distribution detected. There was negative clonus negative Homans. Abdomen was nontender with no costovertebral tenderness noted    Assessment   Greater trochanteric bursitis  Bilateral occipital neuralgia  Migraine headaches  Degenerative joint disease of hips  Degenerative disc disease lumbar spine  Lumbar facet syndrome  Sacroiliac joint dysfunction   Sacroiliitis  Degenerative joint disease of knees  Degenerative disc disease of spine     PLAN  Continue present medications  tramadol and hydrocodone acetaminophen IMPORTANT   NO VOLTAREN IF YOU TAKE MOBIC AND NO IBUPROFEN OR SIMILAR MEDICATIONS as previously discussed   NO NEURONTIN. Patient did not tolerate Neurontin. Patient has taken Topamax in the past. May reconsider Topamax as discussed BEGIN FLECTOR PATCH as discussed  F/U PCP Dr. Tamala Julian for evaluation of  BP and general medical  condition  F/U surgical evaluation. May be considered for further evaluation as discussed  F/U neurological evaluation. May be considered for further evaluation as discussed May consider PNCV/EMG studies and other studies as well as studies to evaluate headache.  May consider radiofrequency rhizolysis or intraspinal procedures pending response to present treatment and F/U evaluation   Patient to call Pain Management Center should patient have concerns prior to scheduled return appointment

## 2015-09-10 NOTE — Progress Notes (Signed)
Safety precautions to be maintained throughout the outpatient stay will include: orient to surroundings, keep bed in low position, maintain call bell within reach at all times, provide assistance with transfer out of bed and ambulation.  

## 2015-09-19 ENCOUNTER — Ambulatory Visit (INDEPENDENT_AMBULATORY_CARE_PROVIDER_SITE_OTHER): Payer: 59 | Admitting: Cardiovascular Disease

## 2015-09-19 ENCOUNTER — Encounter: Payer: Self-pay | Admitting: Cardiovascular Disease

## 2015-09-19 VITALS — BP 123/84 | HR 93 | Ht 67.5 in | Wt 243.0 lb

## 2015-09-19 DIAGNOSIS — E785 Hyperlipidemia, unspecified: Secondary | ICD-10-CM

## 2015-09-19 DIAGNOSIS — Z01818 Encounter for other preprocedural examination: Secondary | ICD-10-CM

## 2015-09-19 DIAGNOSIS — I10 Essential (primary) hypertension: Secondary | ICD-10-CM | POA: Diagnosis not present

## 2015-09-19 NOTE — Patient Instructions (Addendum)
Medication Instructions:  Your physician recommends that you continue on your current medications as directed. Please refer to the Current Medication list given to you today.  Labwork: none  Testing/Procedures: Your physician has requested that you have a lexiscan myoview. For further information please visit [email or web address]. Please follow instruction sheet, as given.  You can call the [facility] office and ask for one of the nurses. They will get this scheduled for you [phone]  Follow-Up: As needed   If you need a refill on your cardiac medications before your next appointment, please call your pharmacy.

## 2015-09-19 NOTE — Progress Notes (Signed)
Cardiology Office Note   Date:  09/19/2015   ID:  Bethany, Scott 1955/08/19, MRN [id number]  PCP:  [name], MD  Cardiologist:   Skeet Latch, MD   Chief Complaint  Patient presents with  . New Patient (Initial Visit)    cramping in legs occasioanlly. Pt states no other Sx or concerns.   . Medical Clearance    clearance for bariatic surgery.      History of Present Illness: Bethany Scott is a 60 y.o. female with asthma, hypertension, hyperlipidemia, and diabetes who presents for presurgical clearance prior to gastric bypass surgery.  Bethany Scott was evaluated by Dr. [name] on 08/08/15 and was determined to be a candidate for laparoscopic Roux-en-Y gastric bypass. She was referred to cardiology for preoperative evaluation.  Bethany Scott Reports that she has been feeling well other than orthopedic pain. 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 She has a pain management specialist but knows that ultimately she needs to lose weight in order for this patient to improve. She is looking forward to her upcoming gastric bypass surgery to help her with this weight loss. She has not noted any chest pain. She does have some shortness of breath with exertion, but she attributes this to the weight at her asthma. She does not get any regular exercise other than going to a sublingual once per week. She tries to turn water and does some swimming but does not swim laps.  She has not noted any lower extremity edema, orthopnea or PND. She does endorse painful varicose veins in her right leg.  Bethany Scott reports that her blood pressure is always well-controlled in the 120s over 70s to 80s.  She has several family members with premature coronary artery disease, including her uncle who had a heart attack in his 38s. Several family members died of lung cancers and they were not smokers. It was thought that this was attributable to an environmental exposure. She actually quit  smoking 31 years ago.   Past Medical History:  Diagnosis Date  . Allergy   . Anxiety   . Arthritis    OA Knee; non-specific autoimmune process followed by [facility]/Rheumatology.  . Asthma   . Depression   . Diabetes mellitus without complication ([place])   . Gastric ulcer, unspecified as acute or chronic, without mention of hemorrhage, perforation, or obstruction   . GERD (gastroesophageal reflux disease)   . Hyperlipidemia   . Hypertension   . Migraine   . Obesity, unspecified   . Other chronic cystitis   . Personal history of colonic polyps     Past Surgical History:  Procedure Laterality Date  . ABDOMINAL HYSTERECTOMY  01/25/1994   uterine fibroids; DUB; cervical dysplasia; ovaries resected two years later.    . APPENDECTOMY    . BILATERAL OOPHORECTOMY  2001  . CESAREAN SECTION     x 2  . COLON SURGERY    . Colonoscopy  09/08/2012   diverticulosis, hemorrhoids.  [name].  Symptoms:  anemia, hemoccult +.  . ESOPHAGOGASTRODUODENOSCOPY  09/08/2012   +HH.  [name].  Symptoms: anemia, hemoccult +.  [name] NASAL SEPTUM SURGERY    . NECK SURGERY    . Rheumatology Consult  11/26/2011   multiple arthralgias. Autoimmune labs negative.  Rx for Plaquenil for non-specific autoimmune process.  [name].  [name] SIGMOID RESECTION / RECTOPEXY    . SPINE SURGERY    . TONSILLECTOMY AND ADENOIDECTOMY  Current Outpatient Prescriptions  Medication Sig Dispense Refill  . atorvastatin (LIPITOR) 20 MG tablet Take 1 tablet (20 mg total) by mouth daily at 6 PM. 90 tablet 1  . azelastine (ASTELIN) 0.1 % nasal spray     . beclomethasone (QVAR) 80 MCG/ACT inhaler Inhale 2 puffs into the lungs 2 (two) times daily. 1 Inhaler 12  . Blood Glucose Monitoring Suppl KIT 1 each by Does not apply route as needed. Please dispense the monitor and supplies compliant with pt insurance ONETOUCH 100 each 11  . cetirizine (ZYRTEC) 10 MG tablet Take 10 mg by mouth daily.    . cholecalciferol (VITAMIN D) 1000  UNITS tablet Take 1,000 Units by mouth daily.    . DULoxetine (CYMBALTA) 60 MG capsule Take 1 capsule (60 mg total) by mouth 2 (two) times daily. 180 capsule 1  . fluticasone (FLONASE) 50 MCG/ACT nasal spray Reported on 07/21/2015    . Fluticasone-Salmeterol (ADVAIR DISKUS) 100-50 MCG/DOSE AEPB Inhale 1 puff into the lungs every 12 (twelve) hours. Reported on 06/25/2015    . HYDROcodone-acetaminophen (NORCO) 7.5-325 MG tablet Limit 1 tab by mouth per day or 3 - 6  times per day if tolerated 180 tablet 0  . lisinopril (PRINIVIL,ZESTRIL) 5 MG tablet Take 1 tablet (5 mg total) by mouth daily. 90 tablet 1  . montelukast (SINGULAIR) 10 MG tablet Take 1 tablet (10 mg total) by mouth daily. 90 tablet 3  . Multiple Vitamin (MULTIVITAMIN) capsule Take 1 capsule by mouth daily.    . nitrofurantoin, macrocrystal-monohydrate, (MACROBID) 100 MG capsule TAKE 1 CAPSULE (100 MG TOTAL) BY MOUTH 2 (TWO) TIMES DAILY AS NEEDED. 30 capsule 2  . pantoprazole (PROTONIX) 40 MG tablet Take 1 tablet (40 mg total) by mouth 2 (two) times daily. 60 tablet 11  . phenazopyridine (PYRIDIUM) 100 MG tablet Take 1 tablet (100 mg total) by mouth 3 (three) times daily as needed for pain. 30 tablet 2  . promethazine (PHENERGAN) 25 MG tablet Take 1 tablet (25 mg total) by mouth every 8 (eight) hours as needed for nausea or vomiting. 40 tablet 0  . rizatriptan (MAXALT) 10 MG tablet Take 10 mg by mouth as needed. May repeat in 2 hours if needed    . traMADol (ULTRAM) 50 MG tablet Limit 1 tab by mouth 2-4 times per day if tolerated 120 tablet 0   Current Facility-Administered Medications  Medication Dose Route Frequency Provider Last Rate Last Dose  . bupivacaine (PF) (MARCAINE) 0.25 % injection 30 mL  30 mL Other Once Mohammed Kindle, MD      . ciprofloxacin (CIPRO) IVPB 400 mg  400 mg Intravenous Once Mohammed Kindle, MD      . diclofenac (FLECTOR) 1.3 % 1 patch  1 patch Transdermal BID Mohammed Kindle, MD      . fentaNYL (SUBLIMAZE) injection  100 mcg  100 mcg Intravenous Once Mohammed Kindle, MD      . lactated ringers infusion 1,000 mL  1,000 mL Intravenous Continuous Mohammed Kindle, MD      . lactated ringers infusion 1,000 mL  1,000 mL Intravenous Continuous Mohammed Kindle, MD      . midazolam (VERSED) 5 MG/5ML injection 5 mg  5 mg Intravenous Once Mohammed Kindle, MD      . orphenadrine (NORFLEX) injection 60 mg  60 mg Intramuscular Once Mohammed Kindle, MD      . sodium chloride flush (NS) 0.9 % injection 20 mL  20 mL Other Once Mohammed Kindle, MD      .  triamcinolone acetonide (KENALOG-40) injection 40 mg  40 mg Other Once Mohammed Kindle, MD        Allergies:   Cephalosporins and Penicillins    Social History:  The patient  reports that she has quit smoking. She has never used smokeless tobacco. She reports that she drinks alcohol. She reports that she does not use drugs.   Family History:  The patient's family history includes Alcohol abuse in her brother and brother; Atrial fibrillation in her maternal grandmother and sister; Bipolar disorder in her father; CAD in her father; COPD in her maternal grandmother; Cancer (age of onset: 30) in her mother; Dementia in her father; Diabetes in her father, maternal grandfather, maternal grandmother, and paternal grandfather; Heart disease in her paternal grandfather; Hyperlipidemia in her maternal grandmother; Hypertension in her brother, father, and sister; Migraines in her mother; Transient ischemic attack in her father.    ROS:  Please see the history of present illness.   Otherwise, review of systems are positive for none.   All other systems are reviewed and negative.    PHYSICAL EXAM: VS:  BP 123/84   Pulse 93   Ht 5' 7.5" (1.715 m)   Wt 243 lb (110.2 kg)   BMI 37.50 kg/m  , BMI Body mass index is 37.5 kg/m. GENERAL:  Well appearing HEENT:  Pupils equal round and reactive, fundi not visualized, oral mucosa unremarkable NECK:  No jugular venous distention, waveform within normal  limits, carotid upstroke brisk and symmetric, no bruits, no thyromegaly LYMPHATICS:  No cervical adenopathy LUNGS:  Clear to auscultation bilaterally HEART:  RRR.  PMI not displaced or sustained,S1 and S2 within normal limits, no S3, no S4, no clicks, no rubs, no murmurs ABD:  Flat, positive bowel sounds normal in frequency in pitch, no bruits, no rebound, no guarding, no midline pulsatile mass, no hepatomegaly, no splenomegaly EXT:  2 plus pulses throughout, no edema, no cyanosis no clubbing SKIN:  No rashes no nodules NEURO:  Cranial nerves II through XII grossly intact, motor grossly intact throughout PSYCH:  Cognitively intact, oriented to person place and time    EKG:  EKG is ordered today. The ekg ordered today demonstrates sinus rhythm. Rate 93 bpm.   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 However, she is not very physically active. Therefore, we cannot be assured that she can achieve 4 METS.  She will have a Lexiscan Myoview prior to surgery.  # Hypertension: Blood pressure is well-controlled on lisinopril.  # Hyperlipidemia: Atorvastatin was recently increased due to LDL 104.    Current medicines are reviewed at length with the patient today.  The patient does not have concerns regarding medicines.  The following changes have been made:  no change  Labs/ tests ordered today include:   Orders Placed This Encounter    Procedures  .  NM Myocar Multi W/Spect W/Wall Motion / EF  . Myocardial Perfusion Imaging  . EKG 12-Lead     Disposition:   FU with Karlena Luebke C. Oval Linsey, MD, [facility] as needed.    This note was written with the assistance of speech recognition software.  Please excuse any transcriptional errors.  Signed, Sander Speckman C. Oval Linsey, MD, [facility]  09/19/2015 2:27 PM    [facility]

## 2015-09-24 ENCOUNTER — Other Ambulatory Visit: Payer: Self-pay | Admitting: Pain Medicine

## 2015-09-24 ENCOUNTER — Encounter: Payer: Self-pay | Admitting: Dietician

## 2015-09-24 ENCOUNTER — Encounter: Payer: Self-pay | Admitting: Pain Medicine

## 2015-09-24 ENCOUNTER — Telehealth: Payer: Self-pay

## 2015-09-24 MED ORDER — TRAMADOL HCL 50 MG PO TABS: ORAL_TABLET | ORAL | 0 refills | Status: DC

## 2015-09-24 MED ORDER — HYDROCODONE-ACETAMINOPHEN 7.5-325 MG PO TABS: ORAL_TABLET | ORAL | 0 refills | Status: DC

## 2015-09-24 NOTE — Telephone Encounter (Signed)
Called and talked to pt and would like another prescription- Pt states that she will make the medications last that she just didn't want to her on vacation

## 2015-09-25 ENCOUNTER — Telehealth: Payer: Self-pay | Admitting: *Deleted

## 2015-09-25 ENCOUNTER — Other Ambulatory Visit: Payer: Self-pay | Admitting: Pain Medicine

## 2015-09-25 ENCOUNTER — Encounter: Payer: Self-pay | Admitting: Pain Medicine

## 2015-09-25 MED ORDER — DICLOFENAC EPOLAMINE 1.3 % TD PTCH: MEDICATED_PATCH | TRANSDERMAL | 0 refills | Status: DC

## 2015-10-06 ENCOUNTER — Ambulatory Visit: Payer: 59 | Attending: Pain Medicine | Admitting: Pain Medicine

## 2015-10-06 ENCOUNTER — Encounter: Payer: Self-pay | Admitting: Pain Medicine

## 2015-10-06 VITALS — BP 125/63 | HR 93 | Temp 97.9°F | Resp 16 | Ht 67.0 in | Wt 230.0 lb

## 2015-10-06 DIAGNOSIS — G43909 Migraine, unspecified, not intractable, without status migrainosus: Secondary | ICD-10-CM | POA: Insufficient documentation

## 2015-10-06 DIAGNOSIS — M5416 Radiculopathy, lumbar region: Secondary | ICD-10-CM | POA: Diagnosis not present

## 2015-10-06 DIAGNOSIS — M1610 Unilateral primary osteoarthritis, unspecified hip: Secondary | ICD-10-CM

## 2015-10-06 DIAGNOSIS — M170 Bilateral primary osteoarthritis of knee: Secondary | ICD-10-CM | POA: Diagnosis not present

## 2015-10-06 DIAGNOSIS — M47818 Spondylosis without myelopathy or radiculopathy, sacral and sacrococcygeal region: Secondary | ICD-10-CM

## 2015-10-06 DIAGNOSIS — M79606 Pain in leg, unspecified: Secondary | ICD-10-CM | POA: Diagnosis present

## 2015-10-06 DIAGNOSIS — M533 Sacrococcygeal disorders, not elsewhere classified: Secondary | ICD-10-CM | POA: Insufficient documentation

## 2015-10-06 DIAGNOSIS — M5136 Other intervertebral disc degeneration, lumbar region: Secondary | ICD-10-CM | POA: Insufficient documentation

## 2015-10-06 DIAGNOSIS — M47817 Spondylosis without myelopathy or radiculopathy, lumbosacral region: Secondary | ICD-10-CM | POA: Diagnosis not present

## 2015-10-06 DIAGNOSIS — M5481 Occipital neuralgia: Secondary | ICD-10-CM | POA: Insufficient documentation

## 2015-10-06 DIAGNOSIS — M160 Bilateral primary osteoarthritis of hip: Secondary | ICD-10-CM | POA: Diagnosis not present

## 2015-10-06 DIAGNOSIS — M7060 Trochanteric bursitis, unspecified hip: Secondary | ICD-10-CM | POA: Diagnosis not present

## 2015-10-06 DIAGNOSIS — M169 Osteoarthritis of hip, unspecified: Secondary | ICD-10-CM | POA: Diagnosis not present

## 2015-10-06 DIAGNOSIS — M47816 Spondylosis without myelopathy or radiculopathy, lumbar region: Secondary | ICD-10-CM

## 2015-10-06 DIAGNOSIS — M7062 Trochanteric bursitis, left hip: Secondary | ICD-10-CM

## 2015-10-06 DIAGNOSIS — M461 Sacroiliitis, not elsewhere classified: Secondary | ICD-10-CM | POA: Diagnosis not present

## 2015-10-06 DIAGNOSIS — M791 Myalgia: Secondary | ICD-10-CM | POA: Diagnosis not present

## 2015-10-06 DIAGNOSIS — M7061 Trochanteric bursitis, right hip: Secondary | ICD-10-CM

## 2015-10-06 DIAGNOSIS — M545 Low back pain: Secondary | ICD-10-CM | POA: Diagnosis present

## 2015-10-06 NOTE — Patient Instructions (Addendum)
PLAN  Continue present medications  tramadol and hydrocodone acetaminophen IMPORTANT   NO VOLTAREN IF YOU TAKE MOBIC AND NO IBUPROFEN OR SIMILAR MEDICATIONS as previously discussed   NO NEURONTIN. Patient did not tolerate Neurontin. Patient has taken Topamax in the past. May reconsider Topamax as discussed BEGIN Flector patch as prescribed  F/U PCP Dr. Tamala Julian for evaluation of  BP and general medical  condition  F/U surgical evaluation. May be considered pending further evaluation as discussed  F/U neurological evaluation. May be considered for further evaluation as discussed May consider PNCV/EMG studies and other studies as well as studies to evaluate headache.  May consider radiofrequency rhizolysis or intraspinal procedures pending response to present treatment and F/U evaluation   Patient to call Pain Management Center should patient have concerns prior to scheduled return appointment

## 2015-10-07 NOTE — Progress Notes (Signed)
     The patient is a 60 year old female who returns to pain management for further evaluation and treatment of pain involving the lower back and lower extremity region. The patient is without recent trauma change in events of daily living the call significant change in symptomatology. The patient is with significant pain involving including pain involving the lower back lower extremity region and greater trochanteric region. We will continue medications consisting of tramadol and hydrocodone acetaminophen and we will begin Flector patch. The patient is call pain management should there be any significant change in condition prior to scheduled return appointment. All agreed to suggested treatment plan.    Physical examination  There was tenderness of the splenius capitis and occipitalis region a mild degree with mild tenderness of the cervical facet thoracic facet region. There was no crepitus of the thoracic region noted. Palpation of the acromioclavicular and glenohumeral joint regions reproduces minimal discomfort and patient appeared to be with bilaterally equal grip strength without significant increase of pain with Tinel and Phalen's maneuver. There was tenderness over the thoracic facet mild degree without crepitus of the thoracic region noted. Palpation over the lumbar paraspinal musculature region lumbar facet region was attends to palpation of moderate degree with lateral bending rotation extension and palpation of the lumbar facets reproducing moderate discomfort. There was moderate to moderately severe tenderness of the greater trochanteric region iliotibial band region. EHL strength appeared to be slightly decreased without a sensory deficit or dermatomal distribution detected. There was negative clonus negative Homans. Abdomen nontender with no costovertebral tenderness noted.    Assessment    Greater trochanteric bursitis  Bilateral occipital neuralgia  Migraine  headaches  Degenerative joint disease of hips  Degenerative disc disease lumbar spine  Lumbar facet syndrome  Sacroiliac joint dysfunction   Sacroiliitis  Degenerative joint disease of knees  Degenerative disc disease of spine     PLAN  Continue present medications  tramadol and hydrocodone acetaminophen IMPORTANT   NO VOLTAREN IF YOU TAKE MOBIC AND NO IBUPROFEN OR SIMILAR MEDICATIONS as previously discussed   NO NEURONTIN. Patient did not tolerate Neurontin. Patient has taken Topamax in the past. May reconsider Topamax as discussed BEGIN Flector patch as prescribed  F/U PCP Dr. Tamala Julian for evaluation of  BP and general medical  condition  F/U surgical evaluation. May be considered pending further evaluation as discussed  F/U neurological evaluation. May be considered for further evaluation as discussed May consider PNCV/EMG studies and other studies as well as studies to evaluate headache.  May consider radiofrequency rhizolysis or intraspinal procedures pending response to present treatment and F/U evaluation   Patient to call Pain Management Center should patient have concerns prior to scheduled return appointment

## 2015-10-09 ENCOUNTER — Encounter: Payer: 59 | Admitting: Pain Medicine

## 2015-10-09 ENCOUNTER — Encounter: Payer: Self-pay | Admitting: Cardiovascular Disease

## 2015-10-09 ENCOUNTER — Encounter: Payer: Self-pay | Admitting: Dietician

## 2015-10-10 ENCOUNTER — Ambulatory Visit: Payer: 59 | Admitting: Dietician

## 2015-10-15 ENCOUNTER — Encounter: Payer: Self-pay | Admitting: Family Medicine

## 2015-10-17 MED ORDER — PROMETHAZINE HCL 25 MG PO TABS: 25.0000 mg | ORAL_TABLET | Freq: Three times a day (TID) | ORAL | 0 refills | Status: DC | PRN

## 2015-10-17 MED ORDER — HYDROCORTISONE ACETATE 25 MG RE SUPP: RECTAL | 0 refills | Status: DC

## 2015-10-21 ENCOUNTER — Encounter: Payer: Self-pay | Admitting: *Deleted

## 2015-10-24 ENCOUNTER — Telehealth: Payer: Self-pay | Admitting: *Deleted

## 2015-10-24 ENCOUNTER — Encounter: Payer: Self-pay | Admitting: Cardiovascular Disease

## 2015-10-24 ENCOUNTER — Other Ambulatory Visit: Payer: Self-pay | Admitting: Family Medicine

## 2015-10-24 DIAGNOSIS — I10 Essential (primary) hypertension: Secondary | ICD-10-CM

## 2015-10-24 DIAGNOSIS — Z01818 Encounter for other preprocedural examination: Secondary | ICD-10-CM

## 2015-10-24 NOTE — Telephone Encounter (Signed)
Patient decided she wanted to have her myoview at [facility] instead of in [place]  Have sent message to scheduling to call and get scheduled, cancelled old order and new order in Epic for [employer]

## 2015-10-25 NOTE — Telephone Encounter (Signed)
Pt has appointment 10/28/2015 30 pills sent

## 2015-10-25 NOTE — Telephone Encounter (Signed)
Keep appt 10/28/2015 - further refills will be given then

## 2015-10-28 ENCOUNTER — Encounter: Payer: Self-pay | Admitting: Dietician

## 2015-10-28 ENCOUNTER — Encounter: Payer: Self-pay | Admitting: Family Medicine

## 2015-10-28 ENCOUNTER — Ambulatory Visit (INDEPENDENT_AMBULATORY_CARE_PROVIDER_SITE_OTHER): Payer: 59 | Admitting: Family Medicine

## 2015-10-28 ENCOUNTER — Encounter: Payer: Self-pay | Admitting: Cardiovascular Disease

## 2015-10-28 VITALS — BP 140/82 | HR 94 | Temp 97.8°F | Resp 18 | Ht 67.0 in | Wt 238.0 lb

## 2015-10-28 DIAGNOSIS — G43009 Migraine without aura, not intractable, without status migrainosus: Secondary | ICD-10-CM

## 2015-10-28 DIAGNOSIS — M1610 Unilateral primary osteoarthritis, unspecified hip: Secondary | ICD-10-CM

## 2015-10-28 DIAGNOSIS — E7800 Pure hypercholesterolemia, unspecified: Secondary | ICD-10-CM | POA: Diagnosis not present

## 2015-10-28 DIAGNOSIS — Z23 Encounter for immunization: Secondary | ICD-10-CM | POA: Diagnosis not present

## 2015-10-28 DIAGNOSIS — M5136 Other intervertebral disc degeneration, lumbar region: Secondary | ICD-10-CM | POA: Diagnosis not present

## 2015-10-28 DIAGNOSIS — B3731 Acute candidiasis of vulva and vagina: Secondary | ICD-10-CM

## 2015-10-28 DIAGNOSIS — M51369 Other intervertebral disc degeneration, lumbar region without mention of lumbar back pain or lower extremity pain: Secondary | ICD-10-CM

## 2015-10-28 DIAGNOSIS — R7302 Impaired glucose tolerance (oral): Secondary | ICD-10-CM | POA: Diagnosis not present

## 2015-10-28 DIAGNOSIS — Z1231 Encounter for screening mammogram for malignant neoplasm of breast: Secondary | ICD-10-CM

## 2015-10-28 DIAGNOSIS — Z0000 Encounter for general adult medical examination without abnormal findings: Secondary | ICD-10-CM | POA: Diagnosis not present

## 2015-10-28 DIAGNOSIS — M170 Bilateral primary osteoarthritis of knee: Secondary | ICD-10-CM

## 2015-10-28 DIAGNOSIS — F418 Other specified anxiety disorders: Secondary | ICD-10-CM

## 2015-10-28 DIAGNOSIS — B373 Candidiasis of vulva and vagina: Secondary | ICD-10-CM

## 2015-10-28 DIAGNOSIS — I10 Essential (primary) hypertension: Secondary | ICD-10-CM

## 2015-10-28 LAB — POCT URINALYSIS DIP (MANUAL ENTRY)
BILIRUBIN UA: NEGATIVE
GLUCOSE UA: NEGATIVE
Ketones, POC UA: NEGATIVE
Leukocytes, UA: NEGATIVE
NITRITE UA: NEGATIVE
PH UA: 6
Protein Ur, POC: NEGATIVE
SPEC GRAV UA: 1.025
UROBILINOGEN UA: 0.2

## 2015-10-28 LAB — CBC WITH DIFFERENTIAL/PLATELET
BASOS PCT: 0 %
Basophils Absolute: 0 cells/uL (ref 0–200)
EOS ABS: 332 {cells}/uL (ref 15–500)
Eosinophils Relative: 4 %
HEMATOCRIT: 41.7 % (ref 35.0–45.0)
Hemoglobin: 14 g/dL (ref 11.7–15.5)
LYMPHS PCT: 41 %
Lymphs Abs: 3403 cells/uL (ref 850–3900)
MCH: 29.7 pg (ref 27.0–33.0)
MCHC: 33.6 g/dL (ref 32.0–36.0)
MCV: 88.5 fL (ref 80.0–100.0)
MONO ABS: 747 {cells}/uL (ref 200–950)
MONOS PCT: 9 %
MPV: 8.6 fL (ref 7.5–12.5)
NEUTROS PCT: 46 %
Neutro Abs: 3818 cells/uL (ref 1500–7800)
PLATELETS: 302 10*3/uL (ref 140–400)
RBC: 4.71 MIL/uL (ref 3.80–5.10)
RDW: 13.2 % (ref 11.0–15.0)
WBC: 8.3 10*3/uL (ref 3.8–10.8)

## 2015-10-28 LAB — TSH: TSH: 1.88 mIU/L

## 2015-10-28 MED ORDER — DULOXETINE HCL 60 MG PO CPEP: 60.0000 mg | ORAL_CAPSULE | Freq: Two times a day (BID) | ORAL | 1 refills | Status: DC

## 2015-10-28 MED ORDER — CLARITHROMYCIN 250 MG PO TABS: 250.0000 mg | ORAL_TABLET | Freq: Two times a day (BID) | ORAL | 0 refills | Status: DC

## 2015-10-28 MED ORDER — ZOSTER VACCINE LIVE 19400 UNT/0.65ML ~~LOC~~ SUSR: 0.6500 mL | Freq: Once | SUBCUTANEOUS | 0 refills | Status: DC

## 2015-10-28 MED ORDER — ZOSTER VACCINE LIVE 19400 UNT/0.65ML ~~LOC~~ SUSR: 0.6500 mL | Freq: Once | SUBCUTANEOUS | 0 refills | Status: AC

## 2015-10-28 MED ORDER — LISINOPRIL 5 MG PO TABS: 5.0000 mg | ORAL_TABLET | Freq: Every day | ORAL | 3 refills | Status: DC

## 2015-10-28 MED ORDER — PANTOPRAZOLE SODIUM 40 MG PO TBEC: 40.0000 mg | DELAYED_RELEASE_TABLET | Freq: Two times a day (BID) | ORAL | 3 refills | Status: DC

## 2015-10-28 MED ORDER — PHENAZOPYRIDINE HCL 100 MG PO TABS: 100.0000 mg | ORAL_TABLET | Freq: Three times a day (TID) | ORAL | 2 refills | Status: DC | PRN

## 2015-10-28 MED ORDER — FLUCONAZOLE 150 MG PO TABS: 150.0000 mg | ORAL_TABLET | Freq: Once | ORAL | 0 refills | Status: AC

## 2015-10-28 NOTE — Progress Notes (Signed)
Subjective:    Patient ID: Bethany Scott, female    DOB: 01/01/1956, 60 y.o.   MRN: [id number]  10/28/2015  Annual Exam   HPI This 60 y.o. female presents for Complete Physical Examination.  Last physical:  12-26-2013 Pap smear:  12-26-2013 hsyterectomy Mammogram:  02-18-2014 Colonoscopy:  09-08-2012 Bone density:  none TDAP:  2008 Pneumovax:  2012 Zostavax:never Influenza: today  Bariatric surgery: Scheduled for myoview this week.  Must undergo an UGI.  No sleep study.  Must hold PPI for two weeks and then undergo labs to check vitamins.  Dr. Oval Linsey for cardiac clearance.  Chronic pain syndrome: Dr. Primus Bravo left [facility]; has opened office in [place]; pt has transferred care to [facility] with [facility].  Taking hydrocodone 7.5 3-6 per day; also taking Tramadol.   Immunization History  Administered Date(s) Administered  . Hepatitis B, adult 01/02/2013, 04/04/2013, 09/17/2013  . Influenza Split 11/05/2011, 10/25/2012  . Influenza,inj,Quad PF,36+ Mos 10/28/2015  . Influenza-Unspecified 11/25/2013, 11/26/2014  . Pneumococcal Polysaccharide-23 07/07/2010  . Tdap 11/23/2006    BP Readings from Last 3 Encounters:  10/28/15 140/82  10/06/15 125/63  09/19/15 123/84   Wt Readings from Last 3 Encounters:  10/28/15 238 lb (108 kg)  10/06/15 230 lb (104.3 kg)  09/19/15 243 lb (110.2 kg)   Review of Systems  Constitutional: Negative for activity change, appetite change, chills, diaphoresis, fatigue, fever and unexpected weight change.  HENT: Positive for congestion, postnasal drip, rhinorrhea, sinus pressure, sneezing, sore throat and voice change. Negative for dental problem, drooling, ear discharge, ear pain, facial swelling, hearing loss, mouth sores, nosebleeds, tinnitus and trouble swallowing.   Eyes: Positive for redness and itching. Negative for photophobia, pain, discharge and visual disturbance.  Respiratory: Negative for apnea, cough, choking, chest tightness, shortness of  breath, wheezing and stridor.   Cardiovascular: Negative for chest pain, palpitations and leg swelling.  Gastrointestinal: Negative for abdominal distention, abdominal pain, anal bleeding, blood in stool, constipation, diarrhea, nausea, rectal pain and vomiting.  Endocrine: Negative for cold intolerance, heat intolerance, polydipsia, polyphagia and polyuria.  Genitourinary: Positive for dyspareunia. Negative for decreased urine volume, difficulty urinating, dysuria, enuresis, flank pain, frequency, genital sores, hematuria, menstrual problem, pelvic pain, urgency, vaginal bleeding, vaginal discharge and vaginal pain.  Musculoskeletal: Positive for arthralgias, back pain, gait problem, joint swelling, myalgias, neck pain and neck stiffness.  Skin: Negative for color change, pallor, rash and wound.  Allergic/Immunologic: Positive for environmental allergies. Negative for food allergies and immunocompromised state.  Neurological: Positive for headaches. Negative for dizziness, tremors, seizures, syncope, facial asymmetry, speech difficulty, weakness, light-headedness and numbness.  Hematological: Negative for adenopathy. Bruises/bleeds easily.  Psychiatric/Behavioral: Negative for agitation, behavioral problems, confusion, decreased concentration, dysphoric mood, hallucinations, self-injury, sleep disturbance and suicidal ideas. The patient is not nervous/anxious and is not hyperactive.     Past Medical History:  Diagnosis Date  . Allergy   . Anemia   . Anxiety   . Arthritis    OA Knee; non-specific autoimmune process followed by [facility]ology.  . Asthma   . Blood transfusion without reported diagnosis   . Depression   . Diabetes mellitus without complication ([place])   . Gastric ulcer, unspecified as acute or chronic, without mention of hemorrhage, perforation, or obstruction   . GERD (gastroesophageal reflux disease)   . Hyperlipidemia   . Hypertension   . Migraine   . Obesity,  unspecified   . Other chronic cystitis   . Personal history of colonic polyps  Past Surgical History:  Procedure Laterality Date  . ABDOMINAL HYSTERECTOMY  01/25/1994   uterine fibroids; DUB; cervical dysplasia; ovaries resected two years later.    . APPENDECTOMY    . BILATERAL OOPHORECTOMY  2001  . CESAREAN SECTION     x 2  . COLON SURGERY    . Colonoscopy  09/08/2012   diverticulosis, hemorrhoids.  [name].  Symptoms:  anemia, hemoccult +.  . ESOPHAGOGASTRODUODENOSCOPY  09/08/2012   +HH.  [name].  Symptoms: anemia, hemoccult +.  [name] NASAL SEPTUM SURGERY    . NECK SURGERY    . Rheumatology Consult  11/26/2011   multiple arthralgias. Autoimmune labs negative.  Rx for Plaquenil for non-specific autoimmune process.  [name].  [name] SIGMOID RESECTION / RECTOPEXY    . SPINE SURGERY    . TONSILLECTOMY AND ADENOIDECTOMY     Allergies  Allergen Reactions  . Cephalosporins Anaphylaxis  . Penicillins Anaphylaxis   Current Outpatient Prescriptions  Medication Sig Dispense Refill  . atorvastatin (LIPITOR) 20 MG tablet Take 1 tablet (20 mg total) by mouth daily at 6 PM. 90 tablet 1  . azelastine (ASTELIN) 0.1 % nasal spray     . beclomethasone (QVAR) 80 MCG/ACT inhaler Inhale 2 puffs into the lungs 2 (two) times daily. 1 Inhaler 12  . Blood Glucose Monitoring Suppl KIT 1 each by Does not apply route as needed. Please dispense the monitor and supplies compliant with pt insurance ONETOUCH 100 each 11  . cetirizine (ZYRTEC) 10 MG tablet Take 10 mg by mouth daily.    . cholecalciferol (VITAMIN D) 1000 UNITS tablet Take 1,000 Units by mouth daily.    . diclofenac (FLECTOR) 1.3 % PTCH Apply one patch or a portion of patch to painful area of skin twice per day if tolerated 60 patch 0  . DULoxetine (CYMBALTA) 60 MG capsule Take 1 capsule (60 mg total) by mouth 2 (two) times daily. 180 capsule 1  . fluticasone (FLONASE) 50 MCG/ACT nasal spray Reported on 07/21/2015    . HYDROcodone-acetaminophen  (NORCO) 7.5-325 MG tablet Limit 1 tab by mouth per day or 3 - 6  times per day if tolerated 180 tablet 0  . hydrocortisone (ANUSOL-HC) 25 MG suppository PLACE 1 SUPPOSITORY (25 MG TOTAL) RECTALLY 2 (TWO) TIMES DAILY AS NEEDED FOR HEMORRHOIDS OR ITCHING. 12 suppository 0  . lisinopril (PRINIVIL,ZESTRIL) 5 MG tablet Take 1 tablet (5 mg total) by mouth daily. 90 tablet 3  . montelukast (SINGULAIR) 10 MG tablet Take 1 tablet (10 mg total) by mouth daily. 90 tablet 3  . Multiple Vitamin (MULTIVITAMIN) capsule Take 1 capsule by mouth daily.    . nitrofurantoin, macrocrystal-monohydrate, (MACROBID) 100 MG capsule TAKE 1 CAPSULE (100 MG TOTAL) BY MOUTH 2 (TWO) TIMES DAILY AS NEEDED. 30 capsule 2  . pantoprazole (PROTONIX) 40 MG tablet Take 1 tablet (40 mg total) by mouth 2 (two) times daily. 180 tablet 3  . phenazopyridine (PYRIDIUM) 100 MG tablet Take 1 tablet (100 mg total) by mouth 3 (three) times daily as needed for pain. 60 tablet 2  . promethazine (PHENERGAN) 25 MG tablet Take 1 tablet (25 mg total) by mouth every 8 (eight) hours as needed for nausea or vomiting. 40 tablet 0  . rizatriptan (MAXALT) 10 MG tablet Take 10 mg by mouth as needed. May repeat in 2 hours if needed    . traMADol (ULTRAM) 50 MG tablet Limit 1 tab by mouth 2-4 times per day if tolerated 120 tablet 0  . clarithromycin (  BIAXIN) 250 MG tablet Take 1 tablet (250 mg total) by mouth 2 (two) times daily. 20 tablet 0  . fluconazole (DIFLUCAN) 150 MG tablet Take 1 tablet (150 mg total) by mouth once. Repeat if needed 2 tablet 0  . Zoster Vaccine Live, PF, (ZOSTAVAX) 89169 UNT/0.65ML injection Inject 19,400 Units into the skin once. 0.65 mL 0   Current Facility-Administered Medications  Medication Dose Route Frequency Provider Last Rate Last Dose  . bupivacaine (PF) (MARCAINE) 0.25 % injection 30 mL  30 mL Other Once Mohammed Kindle, MD      . ciprofloxacin (CIPRO) IVPB 400 mg  400 mg Intravenous Once Mohammed Kindle, MD      . diclofenac  (FLECTOR) 1.3 % 1 patch  1 patch Transdermal BID Mohammed Kindle, MD      . fentaNYL (SUBLIMAZE) injection 100 mcg  100 mcg Intravenous Once Mohammed Kindle, MD      . lactated ringers infusion 1,000 mL  1,000 mL Intravenous Continuous Mohammed Kindle, MD      . lactated ringers infusion 1,000 mL  1,000 mL Intravenous Continuous Mohammed Kindle, MD      . midazolam (VERSED) 5 MG/5ML injection 5 mg  5 mg Intravenous Once Mohammed Kindle, MD      . orphenadrine (NORFLEX) injection 60 mg  60 mg Intramuscular Once Mohammed Kindle, MD      . sodium chloride flush (NS) 0.9 % injection 20 mL  20 mL Other Once Mohammed Kindle, MD      . triamcinolone acetonide (KENALOG-40) injection 40 mg  40 mg Other Once Mohammed Kindle, MD       Social History   Social History  . Marital status: Married    Spouse name: N/A  . Number of children: 2  . Years of education: 12   Occupational History  . 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 Smoking status: Former Research scientist (life sciences)  . Smokeless tobacco: Never Used     Comment: Quit 26 years ago  . Alcohol use Yes     Comment: rarley 3 times per year or less  . Drug use: No  . Sexual activity: Yes    Birth control/ protection: Post-menopausal, Surgical   Other Topics Concern  . Not on file   Social History Narrative   Marital status: married since 60 years; happily married; no abuse.      Children: 2 sons (75, 44); 2 grandchildren  (84yo, 101 months)      Lives: with husband, [name]/son.      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      Tobacco abuse:  Former smoker.      Alcohol:  None; holidays only      Exercise: none in 2017     Seatbelt: 100%; no texting   Family History  Problem Relation Age of Onset  . Bipolar disorder Father   . Transient ischemic attack Father   . Dementia Father     secondary to head trauma  . Hypertension Father   . Diabetes Father   .  CAD Father   . Cancer Mother 57    lung  . Migraines Mother   . Hypothyroidism Mother   . Hypertension Sister   . Atrial fibrillation Sister   . Hypothyroidism Sister   . Alcohol abuse Brother   . Hypertension Brother   . Alcohol abuse Brother   .  Atrial fibrillation Maternal Grandmother   . COPD Maternal Grandmother   . Diabetes Maternal Grandmother   . Hyperlipidemia Maternal Grandmother   . Diabetes Maternal Grandfather   . Diabetes Paternal Grandfather   . Heart disease Paternal Grandfather        Objective:    BP 140/82 (BP Location: Right Arm, Patient Position: Sitting, Cuff Size: Large)   Pulse 94   Temp 97.8 F (36.6 C) (Oral)   Resp 18   Ht 5' 7"  (1.702 m)   Wt 238 lb (108 kg)   SpO2 97%   BMI 37.28 kg/m  Physical Exam  Constitutional: She is oriented to person, place, and time. She appears well-developed and well-nourished. No distress.  HENT:  Head: Normocephalic and atraumatic.  Eyes: Conjunctivae are normal. Pupils are equal, round, and reactive to light.  Neck: Normal range of motion. Neck supple.  Cardiovascular: Normal rate, regular rhythm and normal heart sounds.  Exam reveals no gallop and no friction rub.   No murmur heard. Pulmonary/Chest: Effort normal and breath sounds normal. She has no wheezes. She has no rales. Right breast exhibits no inverted nipple, no mass, no nipple discharge, no skin change and no tenderness. Left breast exhibits no inverted nipple, no mass, no nipple discharge, no skin change and no tenderness. Breasts are symmetrical.  Genitourinary: There is no rash, tenderness or lesion on the right labia. There is no rash, tenderness or lesion on the left labia. Right adnexum displays no mass, no tenderness and no fullness. Left adnexum displays no mass, no tenderness and no fullness. Vaginal discharge found.  Neurological: She is alert and oriented to person, place, and time.  Skin: Rash noted. She is not diaphoretic.     Scattered  erythematous rash under B breasts.  Psychiatric: She has a normal mood and affect. Her behavior is normal.  Nursing note and vitals reviewed.  Results for orders placed or performed in visit on 10/28/15  CBC with Differential/Platelet  Result Value Ref Range   WBC 8.3 3.8 - 10.8 K/uL   RBC 4.71 3.80 - 5.10 MIL/uL   Hemoglobin 14.0 11.7 - 15.5 g/dL   HCT 41.7 35.0 - 45.0 %   MCV 88.5 80.0 - 100.0 fL   MCH 29.7 27.0 - 33.0 pg   MCHC 33.6 32.0 - 36.0 g/dL   RDW 13.2 11.0 - 15.0 %   Platelets 302 140 - 400 K/uL   MPV 8.6 7.5 - 12.5 fL   Neutro Abs 3,818 1,500 - 7,800 cells/uL   Lymphs Abs 3,403 850 - 3,900 cells/uL   Monocytes Absolute 747 200 - 950 cells/uL   Eosinophils Absolute 332 15 - 500 cells/uL   Basophils Absolute 0 0 - 200 cells/uL   Neutrophils Relative % 46 %   Lymphocytes Relative 41 %   Monocytes Relative 9 %   Eosinophils Relative 4 %   Basophils Relative 0 %   Smear Review Criteria for review not met   Comprehensive metabolic panel  Result Value Ref Range   Sodium  135 - 146 mmol/L   Potassium  3.5 - 5.3 mmol/L   Chloride  98 - 110 mmol/L   CO2  20 - 31 mmol/L   Glucose, Bld  65 - 99 mg/dL   BUN  7 - 25 mg/dL   Creat  0.50 - 0.99 mg/dL   Total Bilirubin  0.2 - 1.2 mg/dL   Alkaline Phosphatase  33 - 130 U/L   AST  10 -  35 U/L   ALT  6 - 29 U/L   Total Protein  6.1 - 8.1 g/dL   Albumin  3.6 - 5.1 g/dL   Calcium  8.6 - 10.4 mg/dL  Hemoglobin A1c  Result Value Ref Range   Hgb A1c MFr Bld  <5.7 %   Mean Plasma Glucose  mg/dL  Lipid panel  Result Value Ref Range   Cholesterol  125 - 200 mg/dL   Triglycerides  <150 mg/dL   HDL  >=46 mg/dL   Total CHOL/HDL Ratio  <=5.0 Ratio   VLDL  <30 mg/dL   LDL Cholesterol  <130 mg/dL  TSH  Result Value Ref Range   TSH 1.88 mIU/L  Vitamin B12  Result Value Ref Range   Vitamin B-12  200 - 1100 pg/mL  VITAMIN D 25 Hydroxy (Vit-D Deficiency, Fractures)  Result Value Ref Range   Vit D, 25-Hydroxy  30 - 100 ng/mL    POCT urinalysis dipstick  Result Value Ref Range   Color, UA yellow yellow   Clarity, UA clear clear   Glucose, UA negative negative   Bilirubin, UA negative negative   Ketones, POC UA negative negative   Spec Grav, UA 1.025    Blood, UA small (A) negative   pH, UA 6.0    Protein Ur, POC negative negative   Urobilinogen, UA 0.2    Nitrite, UA Negative Negative   Leukocytes, UA Negative Negative       Assessment & Plan:   1. Routine physical examination   2. Migraine without aura and without status migrainosus, not intractable   3. Essential hypertension, benign   4. Primary osteoarthritis of both knees   5. DDD (degenerative disc disease), lumbar   6. Hip arthritis   7. Depression with anxiety   8. Pure hypercholesterolemia   9. Glucose intolerance (impaired glucose tolerance)   10. Vulvovaginal candidiasis   11. Encounter for screening mammogram for breast cancer   12. Need for prophylactic vaccination and inoculation against influenza    -anticipatory guidance -- exercise, weight loss. -schedule mammogram. -obtain labs. -refills provided. -preparing for bariatric surgery in the upcoming three months; recommend follow-up three months after surgery. -rx for Biaxin provided; to fill if no improvement in allergy symptoms in upcoming month. -followed by Dr. Primus Bravo for chronic pain syndrome.  Weight loss will help with joint pains. -rx for Diflucan provided for vulvovaginal candidiasis and under breast as well.  -rx for Zostavax provided; s/p flu vaccine in office.   Orders Placed This Encounter  Procedures  . MM SCREENING BREAST TOMO BILATERAL    Standing Status:   Future    Standing Expiration Date:   12/28/2016    Order Specific Question:   Reason for Exam (SYMPTOM  OR DIAGNOSIS REQUIRED)    Answer:   annual screening    Order Specific Question:   Is the patient pregnant?    Answer:   No    Order Specific Question:   Preferred imaging location?    Answer:    [facility]  . Flu Vaccine QUAD 36+ mos IM  . CBC with Differential/Platelet  . Comprehensive metabolic panel    Order Specific Question:   Has the patient fasted?    Answer:   Yes  . Hemoglobin A1c  . Lipid panel    Order Specific Question:   Has the patient fasted?    Answer:   Yes  . TSH  . Vitamin B12  . VITAMIN D 25  Hydroxy (Vit-D Deficiency, Fractures)  . POCT urinalysis dipstick   Meds ordered this encounter  Medications  . fluconazole (DIFLUCAN) 150 MG tablet    Sig: Take 1 tablet (150 mg total) by mouth once. Repeat if needed    Dispense:  2 tablet    Refill:  0  . DISCONTD: Zoster Vaccine Live, PF, (ZOSTAVAX) 19622 UNT/0.65ML injection    Sig: Inject 19,400 Units into the skin once.    Dispense:  0.65 mL    Refill:  0  . pantoprazole (PROTONIX) 40 MG tablet    Sig: Take 1 tablet (40 mg total) by mouth 2 (two) times daily.    Dispense:  180 tablet    Refill:  3  . lisinopril (PRINIVIL,ZESTRIL) 5 MG tablet    Sig: Take 1 tablet (5 mg total) by mouth daily.    Dispense:  90 tablet    Refill:  3    Keep appt 10/28/2015 - further refills will be given then  . DULoxetine (CYMBALTA) 60 MG capsule    Sig: Take 1 capsule (60 mg total) by mouth 2 (two) times daily.    Dispense:  180 capsule    Refill:  1  . phenazopyridine (PYRIDIUM) 100 MG tablet    Sig: Take 1 tablet (100 mg total) by mouth 3 (three) times daily as needed for pain.    Dispense:  60 tablet    Refill:  2  . clarithromycin (BIAXIN) 250 MG tablet    Sig: Take 1 tablet (250 mg total) by mouth 2 (two) times daily.    Dispense:  20 tablet    Refill:  0  . Zoster Vaccine Live, PF, (ZOSTAVAX) 29798 UNT/0.65ML injection    Sig: Inject 19,400 Units into the skin once.    Dispense:  0.65 mL    Refill:  0    Return for recheck.   Kristi Elayne Guerin, M.D. [facility] [address] [place], [place]  [address] [phone] phone [phone] fax

## 2015-10-28 NOTE — Patient Instructions (Addendum)
   IF you received an x-ray today, you will receive an invoice from [facility]. Please contact [facility] at [phone] with questions or concerns regarding your invoice.   IF you received labwork today, you will receive an invoice from [facility]. Please contact [facility] at [phone] with questions or concerns regarding your invoice.   Our billing staff will not be able to assist you with questions regarding bills from these companies.  You will be contacted with the lab results as soon as they are available. The fastest way to get your results is to activate your My Chart account. Instructions are located on the last page of this paperwork. If you have not heard from us regarding the results in 2 weeks, please contact this office.    Keeping You Healthy  Get These Tests  Blood Pressure- Have your blood pressure checked by your healthcare provider at least once a year.  Normal blood pressure is 120/80.  Weight- Have your body mass index (BMI) calculated to screen for obesity.  BMI is a measure of body fat based on height and weight.  You can calculate your own BMI at www.nhlbisupport.com/bmi/  Cholesterol- Have your cholesterol checked every year.  Diabetes- Have your blood sugar checked every year if you have high blood pressure, high cholesterol, a family history of diabetes or if you are overweight.  Pap Test - Have a pap test every 1 to 5 years if you have been sexually active.  If you are older than 65 and recent pap tests have been normal you may not need additional pap tests.  In addition, if you have had a hysterectomy  for benign disease additional pap tests are not necessary.  Mammogram-Yearly mammograms are essential for early detection of breast cancer  Screening for Colon Cancer- Colonoscopy starting at age 50. Screening may begin sooner depending on your family history and other health conditions.  Follow up colonoscopy  as directed by your Gastroenterologist.  Screening for Osteoporosis- Screening begins at age 65 with bone density scanning, sooner if you are at higher risk for developing Osteoporosis.  Get these medicines  Calcium with Vitamin D- Your body requires 1200-1500 mg of Calcium a day and 800-1000 IU of Vitamin D a day.  You can only absorb 500 mg of Calcium at a time therefore Calcium must be taken in 2 or 3 separate doses throughout the day.  Hormones- Hormone therapy has been associated with increased risk for certain cancers and heart disease.  Talk to your healthcare provider about if you need relief from menopausal symptoms.  Aspirin- Ask your healthcare provider about taking Aspirin to prevent Heart Disease and Stroke.  Get these Immuniztions  Flu shot- Every fall  Pneumonia shot- Once after the age of 65; if you are younger ask your healthcare provider if you need a pneumonia shot.  Tetanus- Every ten years.  Zostavax- Once after the age of 60 to prevent shingles.  Take these steps  Don't smoke- Your healthcare provider can help you quit. For tips on how to quit, ask your healthcare provider or go to www.smokefree.gov or call 1-800 QUIT-NOW.  Be physically active- Exercise 5 days a week for a minimum of 30 minutes.  If you are not already physically active, start slow and gradually work up to 30 minutes of moderate physical activity.  Try walking, dancing, bike riding, swimming, etc.  Eat a healthy diet- Eat a variety of healthy foods such as fruits, vegetables, whole   grains, low fat milk, low fat cheeses, yogurt, lean meats, chicken, fish, eggs, dried beans, tofu, etc.  For more information go to www.thenutritionsource.org  Dental visit- Brush and floss teeth twice daily; visit your dentist twice a year.  Eye exam- Visit your Optometrist or Ophthalmologist yearly.  Drink alcohol in moderation- Limit alcohol intake to one drink or less a day.  Never drink and  drive.  Depression- Your emotional health is as important as your physical health.  If you're feeling down or losing interest in things you normally enjoy, please talk to your healthcare provider.  Seat Belts- can save your life; always wear one  Smoke/Carbon Monoxide detectors- These detectors need to be installed on the appropriate level of your home.  Replace batteries at least once a year.  Violence- If anyone is threatening or hurting you, please tell your healthcare provider.  Living Will/ Health care power of attorney- Discuss with your healthcare provider and family. 

## 2015-10-29 LAB — COMPREHENSIVE METABOLIC PANEL
ALK PHOS: 83 U/L (ref 33–130)
ALT: 15 U/L (ref 6–29)
AST: 16 U/L (ref 10–35)
Albumin: 4.3 g/dL (ref 3.6–5.1)
BUN: 10 mg/dL (ref 7–25)
CALCIUM: 9.6 mg/dL (ref 8.6–10.4)
CHLORIDE: 100 mmol/L (ref 98–110)
CO2: 30 mmol/L (ref 20–31)
Creat: 0.73 mg/dL (ref 0.50–0.99)
GLUCOSE: 77 mg/dL (ref 65–99)
POTASSIUM: 3.9 mmol/L (ref 3.5–5.3)
Sodium: 139 mmol/L (ref 135–146)
Total Bilirubin: 0.5 mg/dL (ref 0.2–1.2)
Total Protein: 7.1 g/dL (ref 6.1–8.1)

## 2015-10-29 LAB — LIPID PANEL
CHOLESTEROL: 161 mg/dL (ref 125–200)
HDL: 46 mg/dL (ref >=46)
LDL CALC: 91 mg/dL (ref <130)
TRIGLYCERIDES: 120 mg/dL (ref <150)
Total CHOL/HDL Ratio: 3.5 Ratio (ref <=5.0)
VLDL: 24 mg/dL (ref <30)

## 2015-10-29 LAB — HEMOGLOBIN A1C
Hgb A1c MFr Bld: 5.9 % — ABNORMAL HIGH (ref <5.7)
Mean Plasma Glucose: 123 mg/dL

## 2015-10-29 LAB — VITAMIN D 25 HYDROXY (VIT D DEFICIENCY, FRACTURES): VIT D 25 HYDROXY: 36 ng/mL (ref 30–100)

## 2015-10-29 LAB — VITAMIN B12: Vitamin B-12: 795 pg/mL (ref 200–1100)

## 2015-10-31 ENCOUNTER — Ambulatory Visit: Payer: 59 | Admitting: Dietician

## 2015-11-03 ENCOUNTER — Encounter: Payer: Self-pay | Admitting: Family Medicine

## 2015-11-04 ENCOUNTER — Other Ambulatory Visit: Payer: Self-pay | Admitting: Pain Medicine

## 2015-11-04 DIAGNOSIS — M47817 Spondylosis without myelopathy or radiculopathy, lumbosacral region: Secondary | ICD-10-CM | POA: Diagnosis not present

## 2015-11-04 DIAGNOSIS — M169 Osteoarthritis of hip, unspecified: Secondary | ICD-10-CM | POA: Diagnosis not present

## 2015-11-04 DIAGNOSIS — M5416 Radiculopathy, lumbar region: Secondary | ICD-10-CM | POA: Diagnosis not present

## 2015-11-04 DIAGNOSIS — M791 Myalgia: Secondary | ICD-10-CM | POA: Diagnosis not present

## 2015-11-05 ENCOUNTER — Telehealth (HOSPITAL_COMMUNITY): Payer: Self-pay

## 2015-11-05 NOTE — Telephone Encounter (Signed)
I did call and notify Rollene Fare our scheduler and notify her. I have notified Pam in [facility].

## 2015-11-06 ENCOUNTER — Encounter: Payer: Self-pay | Admitting: Family Medicine

## 2015-11-06 ENCOUNTER — Inpatient Hospital Stay (HOSPITAL_COMMUNITY): Admission: RE | Admit: 2015-11-06 | Payer: 59 | Source: Ambulatory Visit

## 2015-11-07 ENCOUNTER — Ambulatory Visit (HOSPITAL_COMMUNITY): Payer: 59

## 2015-11-07 ENCOUNTER — Ambulatory Visit: Payer: 59 | Admitting: Dietician

## 2015-11-10 ENCOUNTER — Other Ambulatory Visit: Payer: Self-pay | Admitting: Family Medicine

## 2015-11-14 ENCOUNTER — Ambulatory Visit
Admission: RE | Admit: 2015-11-14 | Discharge: 2015-11-14 | Disposition: A | Discharge status: Home / Self Care | Payer: 59 | Source: Ambulatory Visit | Attending: General Surgery | Admitting: General Surgery

## 2015-11-14 DIAGNOSIS — R938 Abnormal findings on diagnostic imaging of other specified body structures: Secondary | ICD-10-CM | POA: Diagnosis not present

## 2015-11-14 DIAGNOSIS — Z01818 Encounter for other preprocedural examination: Secondary | ICD-10-CM | POA: Diagnosis not present

## 2015-11-18 ENCOUNTER — Telehealth (HOSPITAL_COMMUNITY): Payer: Self-pay

## 2015-11-18 NOTE — Telephone Encounter (Signed)
Encounter complete. 

## 2015-11-20 ENCOUNTER — Ambulatory Visit (HOSPITAL_COMMUNITY)
Admission: RE | Admit: 2015-11-20 | Discharge: 2015-11-20 | Disposition: A | Discharge status: Home / Self Care | Payer: 59 | Source: Ambulatory Visit | Attending: Cardiovascular Disease | Admitting: Cardiovascular Disease

## 2015-11-20 DIAGNOSIS — Z6837 Body mass index (BMI) 37.0-37.9, adult: Secondary | ICD-10-CM | POA: Diagnosis not present

## 2015-11-20 DIAGNOSIS — Z87891 Personal history of nicotine dependence: Secondary | ICD-10-CM | POA: Diagnosis not present

## 2015-11-20 DIAGNOSIS — Z8249 Family history of ischemic heart disease and other diseases of the circulatory system: Secondary | ICD-10-CM | POA: Diagnosis not present

## 2015-11-20 DIAGNOSIS — Z01818 Encounter for other preprocedural examination: Secondary | ICD-10-CM | POA: Diagnosis not present

## 2015-11-20 DIAGNOSIS — E119 Type 2 diabetes mellitus without complications: Secondary | ICD-10-CM | POA: Diagnosis not present

## 2015-11-20 DIAGNOSIS — E669 Obesity, unspecified: Secondary | ICD-10-CM | POA: Diagnosis not present

## 2015-11-20 DIAGNOSIS — J45909 Unspecified asthma, uncomplicated: Secondary | ICD-10-CM | POA: Insufficient documentation

## 2015-11-20 DIAGNOSIS — I10 Essential (primary) hypertension: Secondary | ICD-10-CM | POA: Insufficient documentation

## 2015-11-20 MED ORDER — TECHNETIUM TC 99M TETROFOSMIN IV KIT
30.1000 | PACK | Freq: Once | INTRAVENOUS | Status: AC | PRN
  Administered 2015-11-20: 30.1 via INTRAVENOUS
  Filled 2015-11-20: qty 31

## 2015-11-20 MED ORDER — REGADENOSON 0.4 MG/5ML IV SOLN
0.4000 mg | Freq: Once | INTRAVENOUS | Status: AC
  Administered 2015-11-20: 0.4 mg via INTRAVENOUS

## 2015-11-21 ENCOUNTER — Ambulatory Visit (INDEPENDENT_AMBULATORY_CARE_PROVIDER_SITE_OTHER): Payer: 59 | Admitting: Family Medicine

## 2015-11-21 ENCOUNTER — Ambulatory Visit (HOSPITAL_COMMUNITY)
Admission: RE | Admit: 2015-11-21 | Discharge: 2015-11-21 | Disposition: A | Discharge status: Home / Self Care | Payer: 59 | Source: Ambulatory Visit | Attending: Cardiovascular Disease | Admitting: Cardiovascular Disease

## 2015-11-21 LAB — MYOCARDIAL PERFUSION IMAGING
CHL CUP NUCLEAR SSS: 8
CSEPPHR: 98 {beats}/min
LV sys vol: 34 mL
LVDIAVOL: 95 mL (ref 46–106)
NUC STRESS TID: 1
Rest HR: 75 {beats}/min
SDS: 6
SRS: 2

## 2015-11-21 MED ORDER — TECHNETIUM TC 99M TETROFOSMIN IV KIT
29.1000 | PACK | Freq: Once | INTRAVENOUS | Status: AC | PRN
  Administered 2015-11-21: 29.1 via INTRAVENOUS

## 2015-11-22 LAB — IRON: Iron: 70 ug/dL (ref 45–160)

## 2015-11-23 NOTE — Progress Notes (Signed)
Lab visit only; no provider encounter. 

## 2015-11-24 ENCOUNTER — Telehealth: Payer: Self-pay | Admitting: *Deleted

## 2015-11-24 LAB — H. PYLORI BREATH TEST

## 2015-11-24 LAB — VITAMIN B1

## 2015-11-24 NOTE — Telephone Encounter (Signed)
[facility] could not run test for vit B1, because we did not spin down the speciman to send frozen.  Would you like for [place] to call patient back for redraw?

## 2015-11-25 ENCOUNTER — Telehealth: Payer: Self-pay | Admitting: Emergency Medicine

## 2015-11-25 ENCOUNTER — Telehealth: Payer: Self-pay

## 2015-11-25 NOTE — Telephone Encounter (Signed)
[facility] called and stated the h pylori text came back as low CO2 so it cannot be performed. Do you want me to call pt to recollect?

## 2015-11-25 NOTE — Telephone Encounter (Signed)
Explained error to patient, she will return on Friday for repeat B12 and H-pylori test.

## 2015-11-25 NOTE — Telephone Encounter (Signed)
Please call patient to discuss and see what she prefers to do.

## 2015-11-26 NOTE — Telephone Encounter (Signed)
Yes. We also did not draw other labs appropriately so please follow-up on other message to patient to discuss both with her.

## 2015-11-28 ENCOUNTER — Other Ambulatory Visit (INDEPENDENT_AMBULATORY_CARE_PROVIDER_SITE_OTHER): Payer: 59 | Admitting: Physician Assistant

## 2015-11-28 NOTE — Progress Notes (Signed)
Patient had to return for our mistake.  Please do not charge.

## 2015-11-28 NOTE — Telephone Encounter (Signed)
Pt came in today

## 2015-11-30 ENCOUNTER — Other Ambulatory Visit: Payer: Self-pay | Admitting: Pain Medicine

## 2015-12-01 LAB — H. PYLORI BREATH TEST: H. PYLORI BREATH TEST: NOT DETECTED

## 2015-12-02 DIAGNOSIS — M791 Myalgia: Secondary | ICD-10-CM | POA: Diagnosis not present

## 2015-12-02 DIAGNOSIS — M5416 Radiculopathy, lumbar region: Secondary | ICD-10-CM | POA: Diagnosis not present

## 2015-12-02 DIAGNOSIS — M169 Osteoarthritis of hip, unspecified: Secondary | ICD-10-CM | POA: Diagnosis not present

## 2015-12-02 DIAGNOSIS — M47817 Spondylosis without myelopathy or radiculopathy, lumbosacral region: Secondary | ICD-10-CM | POA: Diagnosis not present

## 2015-12-04 LAB — VITAMIN B1: Vitamin B1 (Thiamine): 12 nmol/L (ref 8–30)

## 2015-12-08 ENCOUNTER — Encounter: Payer: Self-pay | Admitting: Family Medicine

## 2015-12-15 NOTE — Telephone Encounter (Signed)
Please fax recent lab results to [facility] per patient request.

## 2015-12-23 ENCOUNTER — Telehealth: Payer: 59 | Admitting: Family

## 2015-12-23 DIAGNOSIS — J0100 Acute maxillary sinusitis, unspecified: Secondary | ICD-10-CM | POA: Diagnosis not present

## 2015-12-23 MED ORDER — LEVOFLOXACIN 500 MG PO TABS: 500.0000 mg | ORAL_TABLET | Freq: Every day | ORAL | 0 refills | Status: DC

## 2015-12-23 NOTE — Progress Notes (Signed)

## 2016-01-05 ENCOUNTER — Other Ambulatory Visit: Payer: Self-pay | Admitting: Family Medicine

## 2016-01-05 DIAGNOSIS — M47817 Spondylosis without myelopathy or radiculopathy, lumbosacral region: Secondary | ICD-10-CM | POA: Diagnosis not present

## 2016-01-05 DIAGNOSIS — M791 Myalgia: Secondary | ICD-10-CM | POA: Diagnosis not present

## 2016-01-05 DIAGNOSIS — M169 Osteoarthritis of hip, unspecified: Secondary | ICD-10-CM | POA: Diagnosis not present

## 2016-01-05 DIAGNOSIS — M5416 Radiculopathy, lumbar region: Secondary | ICD-10-CM | POA: Diagnosis not present

## 2016-01-06 ENCOUNTER — Encounter: Payer: Self-pay | Admitting: Family Medicine

## 2016-01-06 MED ORDER — PROMETHAZINE HCL 25 MG PO TABS: 25.0000 mg | ORAL_TABLET | Freq: Three times a day (TID) | ORAL | 0 refills | Status: DC | PRN

## 2016-02-02 ENCOUNTER — Encounter: Payer: 59 | Attending: General Surgery | Admitting: Dietician

## 2016-02-02 ENCOUNTER — Ambulatory Visit: Payer: Self-pay | Admitting: General Surgery

## 2016-02-02 ENCOUNTER — Encounter: Payer: Self-pay | Admitting: Family Medicine

## 2016-02-02 DIAGNOSIS — Z713 Dietary counseling and surveillance: Secondary | ICD-10-CM | POA: Diagnosis not present

## 2016-02-02 NOTE — Progress Notes (Signed)
Scheduling pre op- pleaSE PLACE surgical orders in epic  thanks

## 2016-02-03 DIAGNOSIS — M791 Myalgia: Secondary | ICD-10-CM | POA: Diagnosis not present

## 2016-02-03 DIAGNOSIS — M169 Osteoarthritis of hip, unspecified: Secondary | ICD-10-CM | POA: Diagnosis not present

## 2016-02-03 DIAGNOSIS — M47817 Spondylosis without myelopathy or radiculopathy, lumbosacral region: Secondary | ICD-10-CM | POA: Diagnosis not present

## 2016-02-03 DIAGNOSIS — M5416 Radiculopathy, lumbar region: Secondary | ICD-10-CM | POA: Diagnosis not present

## 2016-02-04 ENCOUNTER — Encounter: Payer: Self-pay | Admitting: Dietician

## 2016-02-04 NOTE — Progress Notes (Signed)
  Pre-Operative Nutrition Class:  Appt start time: 3244   End time:  1830.  Patient was seen on 02/02/2016 for Pre-Operative Bariatric Surgery Education at [facility].   Surgery date: 02/10/2016 Surgery type: RYGB Start weight at [facility]: 234 lbs on 08/29/2015 Weight today: 231.3 lbs  TANITA  BODY COMP RESULTS  02/02/16   BMI (kg/m^2) n/a   Fat Mass (lbs)    Fat Free Mass (lbs)    Total Body Water (lbs)    Samples given per MNT protocol. Patient educated on appropriate usage: Premier protein shake (vanilla - qty 1) Lot #: [id number] Exp: 10/2016  Bariatric Advantage multivitamin (mixed fruit - qty 1) Lot #: [id number] Exp: 11/2016  Celebrate Vitamins Calcium Citrate chew (cherry - qty 1) Lot #: [id number]  Exp: 03/2017  The following the learning objectives were met by the patient during this course:  Identify Pre-Op Dietary Goals and will begin 2 weeks pre-operatively  Identify appropriate sources of fluids and proteins   State protein recommendations and appropriate sources pre and post-operatively  Identify Post-Operative Dietary Goals and will follow for 2 weeks post-operatively  Identify appropriate multivitamin and calcium sources  Describe the need for physical activity post-operatively and will follow MD recommendations  State when to call healthcare provider regarding medication questions or post-operative complications  Handouts given during class include:  Pre-Op Bariatric Surgery Diet Handout  Protein Shake Handout  Post-Op Bariatric Surgery Nutrition Handout  BELT Program Information Flyer  Support Group Information Flyer  [facility] Bariatric Supplements Price List  Follow-Up Plan: Patient will follow-up at [facility] 2 weeks post operatively for diet advancement per MD.

## 2016-02-06 ENCOUNTER — Ambulatory Visit: Payer: Self-pay | Admitting: General Surgery

## 2016-02-06 DIAGNOSIS — K219 Gastro-esophageal reflux disease without esophagitis: Secondary | ICD-10-CM | POA: Diagnosis not present

## 2016-02-06 DIAGNOSIS — M160 Bilateral primary osteoarthritis of hip: Secondary | ICD-10-CM | POA: Diagnosis not present

## 2016-02-06 DIAGNOSIS — E1169 Type 2 diabetes mellitus with other specified complication: Secondary | ICD-10-CM | POA: Diagnosis not present

## 2016-02-06 DIAGNOSIS — E785 Hyperlipidemia, unspecified: Secondary | ICD-10-CM | POA: Diagnosis not present

## 2016-02-06 DIAGNOSIS — M479 Spondylosis, unspecified: Secondary | ICD-10-CM | POA: Diagnosis not present

## 2016-02-06 DIAGNOSIS — M170 Bilateral primary osteoarthritis of knee: Secondary | ICD-10-CM | POA: Diagnosis not present

## 2016-02-06 DIAGNOSIS — E669 Obesity, unspecified: Secondary | ICD-10-CM | POA: Diagnosis not present

## 2016-02-06 DIAGNOSIS — I10 Essential (primary) hypertension: Secondary | ICD-10-CM | POA: Diagnosis not present

## 2016-02-06 NOTE — Patient Instructions (Signed)
SYNIA GUARENTE  02/06/2016   Your procedure is scheduled on: 02/10/2016   Report to [facility] Main  Entrance take [place]  elevators to 3rd floor to  [place] at   [place] AM.  Call this number if you have problems the morning of surgery [phone]   Remember: ONLY 1 PERSON MAY GO WITH YOU TO SHORT STAY TO GET  READY MORNING OF YOUR SURGERY.  Do not eat food or drink liquids :After Midnight.     Take these medicines the morning of surgery with A SIP OF WATER: Qvar if needed and bring, Zyrtec, Singulair, Protonix, Effexor  DO NOT TAKE ANY DIABETIC MEDICATIONS DAY OF YOUR SURGERY                               You may not have any metal on your body including hair pins and              piercings  Do not wear jewelry, make-up, lotions, powders or perfumes, deodorant             Do not wear nail polish.  Do not shave  48 hours prior to surgery.     Do not bring valuables to the hospital. [place].  Contacts, dentures or bridgework may not be worn into surgery.  Leave suitcase in the car. After surgery it may be brought to your room.       r:  Special Instructions: N/A              Please read over the following fact sheets you were given: _____________________________________________________________________             [facility] - Preparing for Surgery Before surgery, you can play an important role.  Because skin is not sterile, your skin needs to be as free of germs as possible.  You can reduce the number of germs on your skin by washing with CHG (chlorahexidine gluconate) soap before surgery.  CHG is an antiseptic cleaner which kills germs and bonds with the skin to continue killing germs even after washing. Please DO NOT use if you have an allergy to CHG or antibacterial soaps.  If your skin becomes reddened/irritated stop using the CHG and inform your nurse when you arrive at Short Stay. Do not  shave (including legs and underarms) for at least 48 hours prior to the first CHG shower.  You may shave your face/neck. Please follow these instructions carefully:  1.  Shower with CHG Soap the night before surgery and the  morning of Surgery.  2.  If you choose to wash your hair, wash your hair first as usual with your  normal  shampoo.  3.  After you shampoo, rinse your hair and body thoroughly to remove the  shampoo.                           4.  Use CHG as you would any other liquid soap.  You can apply chg directly  to the skin and wash                       Gently with a  scrungie or clean washcloth.  5.  Apply the CHG Soap to your body ONLY FROM THE NECK DOWN.   Do not use on face/ open                           Wound or open sores. Avoid contact with eyes, ears mouth and genitals (private parts).                       Wash face,  Genitals (private parts) with your normal soap.             6.  Wash thoroughly, paying special attention to the area where your surgery  will be performed.  7.  Thoroughly rinse your body with warm water from the neck down.  8.  DO NOT shower/wash with your normal soap after using and rinsing off  the CHG Soap.                9.  Pat yourself dry with a clean towel.            10.  Wear clean pajamas.            11.  Place clean sheets on your bed the night of your first shower and do not  sleep with pets. Day of Surgery : Do not apply any lotions/deodorants the morning of surgery.  Please wear clean clothes to the hospital/surgery center.  FAILURE TO FOLLOW THESE INSTRUCTIONS MAY RESULT IN THE CANCELLATION OF YOUR SURGERY PATIENT SIGNATURE_________________________________  NURSE SIGNATURE__________________________________  ________________________________________________________________________ How to Manage Your Diabetes Before and After Surgery  Why is it important to control my blood sugar before and after surgery? . Improving blood sugar levels  before and after surgery helps healing and can limit problems. . A way of improving blood sugar control is eating a healthy diet by: o  Eating less sugar and carbohydrates o  Increasing activity/exercise o  Talking with your doctor about reaching your blood sugar goals . High blood sugars (greater than 180 mg/dL) can raise your risk of infections and slow your recovery, so you will need to focus on controlling your diabetes during the weeks before surgery. . Make sure that the doctor who takes care of your diabetes knows about your planned surgery including the date and location.  How do I manage my blood sugar before surgery? . Check your blood sugar at least 4 times a day, starting 2 days before surgery, to make sure that the level is not too high or low. o Check your blood sugar the morning of your surgery when you wake up and every 2 hours until you get to the Short Stay unit. . If your blood sugar is less than 70 mg/dL, you will need to treat for low blood sugar: o Do not take insulin. o Treat a low blood sugar (less than 70 mg/dL) with  cup of clear juice (cranberry or apple), 4 glucose tablets, OR glucose gel. o Recheck blood sugar in 15 minutes after treatment (to make sure it is greater than 70 mg/dL). If your blood sugar is not greater than 70 mg/dL on recheck, call [phone] for further instructions. . Report your blood sugar to the short stay nurse when you get to Short Stay.  . If you are admitted to the hospital after surgery: o Your blood sugar will be checked by the staff and you will probably be given insulin after  surgery (instead of oral diabetes medicines) to make sure you have good blood sugar levels. o The goal for blood sugar control after surgery is 80-180 mg/dL.   WHAT DO I DO ABOUT MY DIABETES MEDICATION?  [name] Do not take oral diabetes medicines (pills) the morning of surgery.  . THE NIGHT BEFORE SURGERY, take ___________ units of  ___________insulin.       . THE MORNING OF SURGERY, take _____________ units of __________insulin.  . The day of surgery, do not take other diabetes injectables, including Byetta (exenatide), Bydureon (exenatide ER), Victoza (liraglutide), or Trulicity (dulaglutide).  . If your CBG is greater than 220 mg/dL, you may take  of your sliding scale  . (correction) dose of insulin.    For patients with insulin pumps: Contact your diabetes doctor for specific instructions before surgery. Decrease basal rates by 20% at midnight the night before your surgery. Note that if your surgery is planned to be longer than 2 hours, your insulin pump will be removed and intravenous (IV) insulin will be started and managed by the nurses and the anesthesiologist. You will be able to restart your insulin pump once you are awake and able to manage it.  Make sure to bring insulin pump supplies to the hospital with you in case the  site needs to be changed.  Patient Signature:  Date:   Nurse Signature:  Date:   Reviewed and Endorsed by [facility] Patient Education Committee, August 2015

## 2016-02-06 NOTE — H&P (Signed)
Bethany Scott 02/06/2016 3:31 PM Location: [facility] Patient #: [id number] DOB: [birth year] Married / Language: English / Race: White Female  History of Present Illness [name] [name] MD; 02/06/2016 4:47 PM) The patient is a 61 year old female who presents for a pre-op visit. She comes in today for a preoperative appointment. She has been scheduled in approved for laparoscopic Roux-en-Y gastric bypass on January 16. I initially met her in July. Her weight at that time was 236 pounds. She denies any medical changes since she was last seen. She denies any trips the emergency room hospital. She was referred to cardiology and underwent a nuclear stress test which was found to be a low risk study. Her upper GI and chest x-ray right unremarkable. Hemoglobin A1c was 5.9. Other bariatric labs such as conference a metabolic panel, CBC and vitamin levels were within normal limits. Thiamine level was at low normal. She denies any chest pain, chest pressure, source of breath, orthopnea, dyspnea on exertion, prior blood clots, melena, hematochezia, abdominal pain, TIAs or amaurosis fugax   Problem List/Past Medical [name] 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 [name], MD; 02/06/2016 4:48 PM) Appendectomy Cesarean Section - Multiple Colon Polyp Removal - Open Colon Removal - Partial Tonsillectomy  Diagnostic Studies History [name] [name], MD; 02/06/2016 4:48 PM) Colonoscopy 1-5 years ago Mammogram 1-3 years ago Pap Smear 1-5 years ago  Allergies Malachy Moan, RMA; 02/06/2016 3:31 PM) Penicillins  Anaphylaxis. Cephalosporins Anaphylaxis.  Medication History [name] [name], MD; 02/06/2016 4:48 PM) Qvar (80MCG/ACT Aerosol Soln, Inhalation) Active. Atorvastatin Calcium (20MG Tablet, Oral) Active. Hydrocodone-Acetaminophen (7.5-325MG Tablet, Oral) Active. TraMADol HCl (50MG Tablet, Oral) Active. DULoxetine HCl (60MG Capsule DR Part, Oral) Active. Lisinopril (5MG Tablet, Oral) Active. Montelukast Sodium (10MG Tablet, Oral) Active. Nitrofurantoin Monohyd Macro (100MG Capsule, Oral) Active. Pantoprazole Sodium (40MG Tablet DR, Oral) Active. Phenazopyridine HCl (200MG Tablet, Oral) Active. ZyrTEC (10MG Tablet, Oral) Active. Multivitamin Adult (Oral) Active. Maxalt (10MG Tablet, Oral) Active. Medications Reconciled OxyCODONE HCl (5MG/5ML Solution, 5-10 Milliliter Oral every four hours, as needed, Taken starting 02/06/2016) Active. Zofran ODT (4MG Tablet Disint, 1 (one) Tablet Disperse Oral every six hours, as needed, Taken starting 02/06/2016) Active. Protonix (40MG Tablet DR, 1 (one) Tablet Oral daily, Taken starting 02/06/2016) Active.  Social History [name] [name], MD; 02/06/2016 4:48 PM) Alcohol use Occasional alcohol use. Caffeine use Carbonated beverages, Tea. No drug use Tobacco use Former smoker.  Family History [name] [name], MD; 02/06/2016 4:48 PM) Alcohol Abuse Brother. Arthritis Father. Depression Father. Diabetes Mellitus Father. Hypertension Father. Migraine Headache Mother, [name], [name]. Respiratory Condition Mother. Thyroid problems Mother, Sister.  Pregnancy / Birth History [name] [name], MD; 02/06/2016 4:48 PM) Age at menarche 30 years. Gravida 2 Maternal age 47-30 Para 2  Other Problems [name]. [name], MD; 02/06/2016 4:48 PM) Arthritis Asthma Back Pain Bladder Problems Depression Diabetes Mellitus Diverticulosis Gastroesophageal Reflux Disease Hemorrhoids High blood  pressure Hypercholesterolemia Migraine Headache Oophorectomy Left.     Review of Systems [name] [name] MD; 02/06/2016 4:39 PM) General Not Present- Appetite Loss, Chills, Fatigue, Fever, Night Sweats, Weight Gain and Weight Loss. Skin Not Present- Change in Wart/Mole, Dryness, Hives, Jaundice, New Lesions, Non-Healing Wounds, Rash and Ulcer. HEENT Present-  Hoarseness and Seasonal Allergies. Not Present- Earache, Hearing Loss, Nose Bleed, Oral Ulcers, Ringing in the Ears, Sinus Pain, Sore Throat, Visual Disturbances, Wears glasses/contact lenses and Yellow Eyes. Respiratory Present- Snoring. Not Present- Bloody sputum, Chronic Cough, Difficulty Breathing and Wheezing. Cardiovascular Not Present- Chest Pain, Difficulty Breathing Lying Down, Leg Cramps, Palpitations, Rapid Heart Rate, Shortness of Breath and Swelling of Extremities. Gastrointestinal Not Present- Abdominal Pain, Bloating, Bloody Stool, Change in Bowel Habits, Chronic diarrhea, Constipation, Difficulty Swallowing, Excessive gas, Gets full quickly at meals, Hemorrhoids, Indigestion, Nausea, Rectal Pain and Vomiting. Female Genitourinary Not Present- Frequency, Nocturia, Painful Urination, Pelvic Pain and Urgency. Musculoskeletal Present- Back Pain, Joint Pain and Muscle Pain. Not Present- Joint Stiffness, Muscle Weakness and Swelling of Extremities. Neurological Present- Headaches. Not Present- Decreased Memory, Fainting, Numbness, Seizures, Tingling, Tremor, Trouble walking and Weakness. Psychiatric Present- Depression. Not Present- Anxiety, Bipolar, Change in Sleep Pattern, Fearful and Frequent crying. Hematology Present- Easy Bruising. Not Present- Excessive bleeding, Gland problems, HIV and Persistent Infections.  Vitals Malachy Moan RMA; 02/06/2016 3:32 PM) 02/06/2016 3:31 PM Weight: 230.4 lb Height: 67in Body Surface Area: 2.15 m Body Mass Index: 36.09 kg/m  Temp.: 97.42F  Pulse: 96 (Regular)  BP:  130/80 (Sitting, Left Arm, Standard)      Physical Exam [name] Gabriela Irigoyen MD; 02/06/2016 4:39 PM)  General Mental Status-Alert. General Appearance-Consistent with stated age. Hydration-Well hydrated. Voice-Normal. Note: morbidly obese  Head and Neck Head-normocephalic, atraumatic with no lesions or palpable masses. Trachea-midline. Thyroid Gland Characteristics - normal size and consistency.  Eye Eyeball - Bilateral-Extraocular movements intact. Sclera/Conjunctiva - Bilateral-No scleral icterus.  Chest and Lung Exam Chest and lung exam reveals -quiet, even and easy respiratory effort with no use of accessory muscles and on auscultation, normal breath sounds, no adventitious sounds and normal vocal resonance. Inspection Chest Wall - Normal. Back - normal.  Breast - Did not examine.  Cardiovascular Cardiovascular examination reveals -normal heart sounds, regular rate and rhythm with no murmurs and normal pedal pulses bilaterally.  Abdomen Inspection Inspection of the abdomen reveals - No Hernias. Skin - Scar - Note: lower midline incision; Pfannenstiel incision. Palpation/Percussion Palpation and Percussion of the abdomen reveal - Soft, Non Tender, No Rebound tenderness, No Rigidity (guarding) and No hepatosplenomegaly. Auscultation Auscultation of the abdomen reveals - Bowel sounds normal.  Peripheral Vascular Upper Extremity Palpation - Pulses bilaterally normal.  Neurologic Neurologic evaluation reveals -alert and oriented x 3 with no impairment of recent or remote memory. Mental Status-Normal.  Neuropsychiatric The patient's mood and affect are described as -normal. Judgment and Insight-insight is appropriate concerning matters relevant to self.  Musculoskeletal Normal Exam - Left-Upper Extremity Strength Normal and Lower Extremity Strength Normal. Normal Exam - Right-Upper Extremity Strength Normal and Lower Extremity Strength  Normal. Note: Bilateral knee crepitus  Lymphatic Head & Neck  General Head & Neck Lymphatics: Bilateral - Description - Normal. Axillary - Did not examine. Femoral & Inguinal - Did not examine.    Assessment & Plan [name] Lilee Aldea MD; 02/06/2016 4:48 PM)  OBESITY (BMI 30-39.9) (E66.9) Impression: We reviewed her preoperative workup. We rediscussed the typical hospitalization and postoperative course. We discussed the typical diet progression after surgery. She was given her postoperative prescriptions today. She is also given instructions for the bowel prep. All of her questions were asked and answered. We discussed the importance of preoperative meal plan  Current Plans Pt Education - EMW_preopbariatric DIABETES MELLITUS TYPE 2 IN OBESE (E11.69)  HYPERTENSION, BENIGN (I10)  PRIMARY OSTEOARTHRITIS OF BOTH  HIPS (M16.0)  OSTEOARTHRITIS OF LOWER BACK (M47.9)  DYSLIPIDEMIA (E78.5)  GASTROESOPHAGEAL REFLUX DISEASE, ESOPHAGITIS PRESENCE NOT SPECIFIED (K21.9)  PRIMARY OSTEOARTHRITIS OF BOTH KNEES (M17.0)  [name]. [name], MD, FACS General, Bariatric, & Minimally Invasive Surgery [facility], [place]

## 2016-02-09 ENCOUNTER — Encounter (HOSPITAL_COMMUNITY): Payer: Self-pay

## 2016-02-09 ENCOUNTER — Encounter (HOSPITAL_COMMUNITY)
Admission: RE | Admit: 2016-02-09 | Discharge: 2016-02-09 | Disposition: A | Discharge status: Home / Self Care | Payer: 59 | Source: Ambulatory Visit | Attending: General Surgery | Admitting: General Surgery

## 2016-02-09 DIAGNOSIS — F329 Major depressive disorder, single episode, unspecified: Secondary | ICD-10-CM | POA: Diagnosis not present

## 2016-02-09 DIAGNOSIS — M1990 Unspecified osteoarthritis, unspecified site: Secondary | ICD-10-CM | POA: Diagnosis not present

## 2016-02-09 DIAGNOSIS — Z880 Allergy status to penicillin: Secondary | ICD-10-CM | POA: Diagnosis not present

## 2016-02-09 DIAGNOSIS — E785 Hyperlipidemia, unspecified: Secondary | ICD-10-CM | POA: Diagnosis not present

## 2016-02-09 DIAGNOSIS — M170 Bilateral primary osteoarthritis of knee: Secondary | ICD-10-CM | POA: Diagnosis not present

## 2016-02-09 DIAGNOSIS — Z87891 Personal history of nicotine dependence: Secondary | ICD-10-CM | POA: Diagnosis not present

## 2016-02-09 DIAGNOSIS — Z6836 Body mass index (BMI) 36.0-36.9, adult: Secondary | ICD-10-CM | POA: Diagnosis not present

## 2016-02-09 DIAGNOSIS — M160 Bilateral primary osteoarthritis of hip: Secondary | ICD-10-CM | POA: Diagnosis not present

## 2016-02-09 DIAGNOSIS — Z8719 Personal history of other diseases of the digestive system: Secondary | ICD-10-CM | POA: Diagnosis not present

## 2016-02-09 DIAGNOSIS — I10 Essential (primary) hypertension: Secondary | ICD-10-CM | POA: Diagnosis not present

## 2016-02-09 DIAGNOSIS — K660 Peritoneal adhesions (postprocedural) (postinfection): Secondary | ICD-10-CM | POA: Diagnosis not present

## 2016-02-09 DIAGNOSIS — Z888 Allergy status to other drugs, medicaments and biological substances status: Secondary | ICD-10-CM | POA: Diagnosis not present

## 2016-02-09 DIAGNOSIS — F419 Anxiety disorder, unspecified: Secondary | ICD-10-CM | POA: Diagnosis not present

## 2016-02-09 DIAGNOSIS — K219 Gastro-esophageal reflux disease without esophagitis: Secondary | ICD-10-CM | POA: Diagnosis not present

## 2016-02-09 DIAGNOSIS — J45909 Unspecified asthma, uncomplicated: Secondary | ICD-10-CM | POA: Diagnosis not present

## 2016-02-09 DIAGNOSIS — E119 Type 2 diabetes mellitus without complications: Secondary | ICD-10-CM | POA: Diagnosis not present

## 2016-02-09 DIAGNOSIS — Z86010 Personal history of colonic polyps: Secondary | ICD-10-CM | POA: Diagnosis not present

## 2016-02-09 HISTORY — DX: Other specified postprocedural states: Z98.890

## 2016-02-09 HISTORY — DX: Nausea with vomiting, unspecified: R11.2

## 2016-02-09 LAB — COMPREHENSIVE METABOLIC PANEL
ALT: 25 U/L (ref 14–54)
AST: 23 U/L (ref 15–41)
Albumin: 4.3 g/dL (ref 3.5–5.0)
Alkaline Phosphatase: 67 U/L (ref 38–126)
Anion gap: 8 (ref 5–15)
BUN: 17 mg/dL (ref 6–20)
CO2: 26 mmol/L (ref 22–32)
Calcium: 9.2 mg/dL (ref 8.9–10.3)
Chloride: 104 mmol/L (ref 101–111)
Creatinine, Ser: 0.6 mg/dL (ref 0.44–1.00)
GFR calc Af Amer: >60 mL/min (ref >60)
Glucose, Bld: 193 mg/dL — ABNORMAL HIGH (ref 65–99)
POTASSIUM: 4.1 mmol/L (ref 3.5–5.1)
SODIUM: 138 mmol/L (ref 135–145)
Total Bilirubin: 0.5 mg/dL (ref 0.3–1.2)
Total Protein: 7 g/dL (ref 6.5–8.1)

## 2016-02-09 LAB — CBC WITH DIFFERENTIAL/PLATELET
BASOS ABS: 0 10*3/uL (ref 0.0–0.1)
Basophils Relative: 0 %
EOS ABS: 0.2 10*3/uL (ref 0.0–0.7)
EOS PCT: 4 %
HCT: 42 % (ref 36.0–46.0)
Hemoglobin: 14 g/dL (ref 12.0–15.0)
LYMPHS PCT: 31 %
Lymphs Abs: 1.5 10*3/uL (ref 0.7–4.0)
MCH: 29.5 pg (ref 26.0–34.0)
MCHC: 33.3 g/dL (ref 30.0–36.0)
MCV: 88.4 fL (ref 78.0–100.0)
MONO ABS: 0.2 10*3/uL (ref 0.1–1.0)
Monocytes Relative: 5 %
Neutro Abs: 2.9 10*3/uL (ref 1.7–7.7)
Neutrophils Relative %: 60 %
PLATELETS: 271 10*3/uL (ref 150–400)
RBC: 4.75 MIL/uL (ref 3.87–5.11)
RDW: 13.1 % (ref 11.5–15.5)
WBC: 4.8 10*3/uL (ref 4.0–10.5)

## 2016-02-09 LAB — GLUCOSE, CAPILLARY: GLUCOSE-CAPILLARY: 247 mg/dL — AB (ref 65–99)

## 2016-02-09 NOTE — Progress Notes (Signed)
EKG-09/19/15- EPIC  11/21/15- Stress TEst- epic  CXR- 11/14/15- EPIC

## 2016-02-10 ENCOUNTER — Encounter (HOSPITAL_COMMUNITY): Payer: Self-pay | Admitting: *Deleted

## 2016-02-10 ENCOUNTER — Inpatient Hospital Stay (HOSPITAL_COMMUNITY): Payer: 59 | Admitting: Anesthesiology

## 2016-02-10 ENCOUNTER — Encounter (HOSPITAL_COMMUNITY)
Admission: RE | Disposition: A | Discharge status: Home / Self Care | Payer: Self-pay | Source: Ambulatory Visit | Attending: General Surgery

## 2016-02-10 ENCOUNTER — Ambulatory Visit (HOSPITAL_COMMUNITY)
Admission: RE | Admit: 2016-02-10 | Discharge: 2016-02-10 | Disposition: A | Discharge status: Home / Self Care | Payer: 59 | Source: Ambulatory Visit | Attending: General Surgery | Admitting: General Surgery

## 2016-02-10 DIAGNOSIS — M160 Bilateral primary osteoarthritis of hip: Secondary | ICD-10-CM | POA: Diagnosis not present

## 2016-02-10 DIAGNOSIS — Z888 Allergy status to other drugs, medicaments and biological substances status: Secondary | ICD-10-CM | POA: Insufficient documentation

## 2016-02-10 DIAGNOSIS — I10 Essential (primary) hypertension: Secondary | ICD-10-CM | POA: Diagnosis not present

## 2016-02-10 DIAGNOSIS — Z6836 Body mass index (BMI) 36.0-36.9, adult: Secondary | ICD-10-CM | POA: Diagnosis not present

## 2016-02-10 DIAGNOSIS — E785 Hyperlipidemia, unspecified: Secondary | ICD-10-CM | POA: Diagnosis not present

## 2016-02-10 DIAGNOSIS — Z87891 Personal history of nicotine dependence: Secondary | ICD-10-CM | POA: Insufficient documentation

## 2016-02-10 DIAGNOSIS — F419 Anxiety disorder, unspecified: Secondary | ICD-10-CM | POA: Insufficient documentation

## 2016-02-10 DIAGNOSIS — K660 Peritoneal adhesions (postprocedural) (postinfection): Secondary | ICD-10-CM | POA: Diagnosis not present

## 2016-02-10 DIAGNOSIS — Z880 Allergy status to penicillin: Secondary | ICD-10-CM | POA: Insufficient documentation

## 2016-02-10 DIAGNOSIS — K219 Gastro-esophageal reflux disease without esophagitis: Secondary | ICD-10-CM | POA: Diagnosis not present

## 2016-02-10 DIAGNOSIS — M170 Bilateral primary osteoarthritis of knee: Secondary | ICD-10-CM | POA: Diagnosis not present

## 2016-02-10 DIAGNOSIS — M1990 Unspecified osteoarthritis, unspecified site: Secondary | ICD-10-CM | POA: Insufficient documentation

## 2016-02-10 DIAGNOSIS — E119 Type 2 diabetes mellitus without complications: Secondary | ICD-10-CM | POA: Insufficient documentation

## 2016-02-10 DIAGNOSIS — F329 Major depressive disorder, single episode, unspecified: Secondary | ICD-10-CM | POA: Insufficient documentation

## 2016-02-10 DIAGNOSIS — Z8719 Personal history of other diseases of the digestive system: Secondary | ICD-10-CM | POA: Insufficient documentation

## 2016-02-10 DIAGNOSIS — J45909 Unspecified asthma, uncomplicated: Secondary | ICD-10-CM | POA: Insufficient documentation

## 2016-02-10 DIAGNOSIS — Z86010 Personal history of colonic polyps: Secondary | ICD-10-CM | POA: Insufficient documentation

## 2016-02-10 HISTORY — PX: LAPAROSCOPY: SHX197

## 2016-02-10 HISTORY — PX: LAPAROSCOPIC LYSIS OF ADHESIONS: SHX5905

## 2016-02-10 LAB — HEMOGLOBIN A1C
Hgb A1c MFr Bld: 6.1 % — ABNORMAL HIGH (ref 4.8–5.6)
Mean Plasma Glucose: 128 mg/dL

## 2016-02-10 LAB — GLUCOSE, CAPILLARY
GLUCOSE-CAPILLARY: 167 mg/dL — AB (ref 65–99)
Glucose-Capillary: 155 mg/dL — ABNORMAL HIGH (ref 65–99)

## 2016-02-10 SURGERY — LAPAROSCOPY, DIAGNOSTIC
Anesthesia: General | Site: Abdomen

## 2016-02-10 MED ORDER — LEVOFLOXACIN IN D5W 750 MG/150ML IV SOLN
750.0000 mg | INTRAVENOUS | Status: AC
  Administered 2016-02-10: 750 mg via INTRAVENOUS

## 2016-02-10 MED ORDER — ROCURONIUM BROMIDE 50 MG/5ML IV SOSY
PREFILLED_SYRINGE | INTRAVENOUS | Status: AC
  Filled 2016-02-10: qty 5

## 2016-02-10 MED ORDER — PROMETHAZINE HCL 25 MG/ML IJ SOLN: 6.2500 mg | INTRAMUSCULAR | Status: DC | PRN

## 2016-02-10 MED ORDER — PROPOFOL 10 MG/ML IV BOLUS
INTRAVENOUS | Status: DC | PRN
  Administered 2016-02-10: 200 mg via INTRAVENOUS

## 2016-02-10 MED ORDER — ACETAMINOPHEN 10 MG/ML IV SOLN: 1000.0000 mg | Freq: Once | INTRAVENOUS | Status: DC

## 2016-02-10 MED ORDER — SUCCINYLCHOLINE CHLORIDE 200 MG/10ML IV SOSY
PREFILLED_SYRINGE | INTRAVENOUS | Status: AC
  Filled 2016-02-10: qty 10

## 2016-02-10 MED ORDER — OXYCODONE HCL 5 MG PO TABS
5.0000 mg | ORAL_TABLET | ORAL | Status: DC | PRN
  Administered 2016-02-10: 5 mg via ORAL
  Filled 2016-02-10: qty 1

## 2016-02-10 MED ORDER — SUGAMMADEX SODIUM 200 MG/2ML IV SOLN
INTRAVENOUS | Status: AC
  Filled 2016-02-10: qty 2

## 2016-02-10 MED ORDER — HEPARIN SODIUM (PORCINE) 5000 UNIT/ML IJ SOLN
5000.0000 [IU] | INTRAMUSCULAR | Status: AC
  Administered 2016-02-10: 5000 [IU] via SUBCUTANEOUS
  Filled 2016-02-10: qty 1

## 2016-02-10 MED ORDER — SCOPOLAMINE 1 MG/3DAYS TD PT72
MEDICATED_PATCH | TRANSDERMAL | Status: AC
  Filled 2016-02-10: qty 1

## 2016-02-10 MED ORDER — FENTANYL CITRATE (PF) 100 MCG/2ML IJ SOLN
INTRAMUSCULAR | Status: AC
  Filled 2016-02-10: qty 2

## 2016-02-10 MED ORDER — LIDOCAINE 2% (20 MG/ML) 5 ML SYRINGE
INTRAMUSCULAR | Status: AC
  Filled 2016-02-10: qty 5

## 2016-02-10 MED ORDER — ONDANSETRON HCL 4 MG/2ML IJ SOLN
INTRAMUSCULAR | Status: AC
  Filled 2016-02-10: qty 2

## 2016-02-10 MED ORDER — BUPIVACAINE LIPOSOME 1.3 % IJ SUSP
20.0000 mL | Freq: Once | INTRAMUSCULAR | Status: DC
  Filled 2016-02-10: qty 20

## 2016-02-10 MED ORDER — DEXAMETHASONE SODIUM PHOSPHATE 10 MG/ML IJ SOLN
INTRAMUSCULAR | Status: DC | PRN
  Administered 2016-02-10: 10 mg via INTRAVENOUS

## 2016-02-10 MED ORDER — PROPOFOL 10 MG/ML IV BOLUS
INTRAVENOUS | Status: AC
  Filled 2016-02-10: qty 20

## 2016-02-10 MED ORDER — BUPIVACAINE LIPOSOME 1.3 % IJ SUSP
20.0000 mL | Freq: Once | INTRAMUSCULAR | Status: AC
  Administered 2016-02-10: 20 mL
  Filled 2016-02-10: qty 20

## 2016-02-10 MED ORDER — SUGAMMADEX SODIUM 200 MG/2ML IV SOLN
INTRAVENOUS | Status: DC | PRN
  Administered 2016-02-10: 400 mg via INTRAVENOUS

## 2016-02-10 MED ORDER — SCOPOLAMINE 1 MG/3DAYS TD PT72
1.0000 | MEDICATED_PATCH | TRANSDERMAL | Status: DC
  Administered 2016-02-10: 1 via TRANSDERMAL
  Filled 2016-02-10: qty 1

## 2016-02-10 MED ORDER — LEVOFLOXACIN IN D5W 750 MG/150ML IV SOLN
INTRAVENOUS | Status: AC
  Filled 2016-02-10: qty 150

## 2016-02-10 MED ORDER — FENTANYL CITRATE (PF) 250 MCG/5ML IJ SOLN
INTRAMUSCULAR | Status: AC
  Filled 2016-02-10: qty 5

## 2016-02-10 MED ORDER — SUCCINYLCHOLINE CHLORIDE 200 MG/10ML IV SOSY
PREFILLED_SYRINGE | INTRAVENOUS | Status: DC | PRN
  Administered 2016-02-10: 120 mg via INTRAVENOUS

## 2016-02-10 MED ORDER — FENTANYL CITRATE (PF) 250 MCG/5ML IJ SOLN
INTRAMUSCULAR | Status: DC | PRN
  Administered 2016-02-10 (×5): 50 ug via INTRAVENOUS

## 2016-02-10 MED ORDER — LACTATED RINGERS IV SOLN
INTRAVENOUS | Status: DC | PRN
  Administered 2016-02-10 (×2): via INTRAVENOUS

## 2016-02-10 MED ORDER — MIDAZOLAM HCL 5 MG/5ML IJ SOLN
INTRAMUSCULAR | Status: DC | PRN
  Administered 2016-02-10: 2 mg via INTRAVENOUS

## 2016-02-10 MED ORDER — LIDOCAINE 2% (20 MG/ML) 5 ML SYRINGE
INTRAMUSCULAR | Status: DC | PRN
  Administered 2016-02-10: 60 mg via INTRAVENOUS

## 2016-02-10 MED ORDER — PHENYLEPHRINE 40 MCG/ML (10ML) SYRINGE FOR IV PUSH (FOR BLOOD PRESSURE SUPPORT)
PREFILLED_SYRINGE | INTRAVENOUS | Status: AC
  Filled 2016-02-10: qty 10

## 2016-02-10 MED ORDER — FENTANYL CITRATE (PF) 100 MCG/2ML IJ SOLN
25.0000 ug | INTRAMUSCULAR | Status: DC | PRN
  Administered 2016-02-10 (×2): 50 ug via INTRAVENOUS

## 2016-02-10 MED ORDER — DEXAMETHASONE SODIUM PHOSPHATE 10 MG/ML IJ SOLN
INTRAMUSCULAR | Status: AC
  Filled 2016-02-10: qty 1

## 2016-02-10 MED ORDER — MIDAZOLAM HCL 2 MG/2ML IJ SOLN
INTRAMUSCULAR | Status: AC
  Filled 2016-02-10: qty 2

## 2016-02-10 MED ORDER — LACTATED RINGERS IV SOLN: INTRAVENOUS | Status: DC

## 2016-02-10 MED ORDER — SODIUM CHLORIDE 0.9 % IJ SOLN
INTRAMUSCULAR | Status: AC
  Filled 2016-02-10: qty 50

## 2016-02-10 MED ORDER — ONDANSETRON HCL 4 MG/2ML IJ SOLN
INTRAMUSCULAR | Status: DC | PRN
  Administered 2016-02-10: 4 mg via INTRAVENOUS

## 2016-02-10 MED ORDER — ROCURONIUM BROMIDE 50 MG/5ML IV SOSY
PREFILLED_SYRINGE | INTRAVENOUS | Status: DC | PRN
  Administered 2016-02-10: 50 mg via INTRAVENOUS
  Administered 2016-02-10: 20 mg via INTRAVENOUS

## 2016-02-10 MED ORDER — PHENYLEPHRINE 40 MCG/ML (10ML) SYRINGE FOR IV PUSH (FOR BLOOD PRESSURE SUPPORT)
PREFILLED_SYRINGE | INTRAVENOUS | Status: DC | PRN
  Administered 2016-02-10: 80 ug via INTRAVENOUS

## 2016-02-10 MED ORDER — EVICEL 5 ML EX KIT
PACK | Freq: Once | CUTANEOUS | Status: DC
  Filled 2016-02-10: qty 1

## 2016-02-10 SURGICAL SUPPLY — 74 items
ADH SKN CLS APL DERMABOND .7 (GAUZE/BANDAGES/DRESSINGS)
APPLICATOR COTTON TIP 6IN STRL (MISCELLANEOUS) ×8 IMPLANT
APPLIER CLIP ROT 13.4 12 LRG (CLIP)
APR CLP LRG 13.4X12 ROT 20 MLT (CLIP)
BLADE SURG SZ11 CARB STEEL (BLADE) ×4 IMPLANT
CABLE HIGH FREQUENCY MONO STRZ (ELECTRODE) IMPLANT
CHLORAPREP W/TINT 26ML (MISCELLANEOUS) ×8 IMPLANT
CLIP APPLIE ROT 13.4 12 LRG (CLIP) IMPLANT
CLIP SUT LAPRA TY ABSORB (SUTURE) ×8 IMPLANT
COVER SURGICAL LIGHT HANDLE (MISCELLANEOUS) ×4 IMPLANT
CUTTER FLEX LINEAR 45M (STAPLE) IMPLANT
DERMABOND ADVANCED (GAUZE/BANDAGES/DRESSINGS)
DERMABOND ADVANCED .7 DNX12 (GAUZE/BANDAGES/DRESSINGS) IMPLANT
DEVICE SUT QUICK LOAD TK 5 (STAPLE) IMPLANT
DEVICE SUT TI-KNOT TK 5X26 (MISCELLANEOUS) IMPLANT
DEVICE SUTURE ENDOST 10MM (ENDOMECHANICALS) ×4 IMPLANT
DEVICE TI KNOT TK5 (MISCELLANEOUS)
DRAIN PENROSE 18X1/4 LTX STRL (WOUND CARE) ×4 IMPLANT
ELECT REM PT RETURN 9FT ADLT (ELECTROSURGICAL) ×4
ELECTRODE REM PT RTRN 9FT ADLT (ELECTROSURGICAL) ×2 IMPLANT
GAUZE SPONGE 4X4 12PLY STRL (GAUZE/BANDAGES/DRESSINGS) IMPLANT
GAUZE SPONGE 4X4 16PLY XRAY LF (GAUZE/BANDAGES/DRESSINGS) ×4 IMPLANT
GLOVE BIO SURGEON STRL SZ7.5 (GLOVE) IMPLANT
GLOVE INDICATOR 8.0 STRL GRN (GLOVE) ×4 IMPLANT
GOWN STRL REUS W/TWL XL LVL3 (GOWN DISPOSABLE) ×16 IMPLANT
HOVERMATT SINGLE USE (MISCELLANEOUS) ×4 IMPLANT
IRRIG SUCT STRYKERFLOW 2 WTIP (MISCELLANEOUS) ×4
IRRIGATION SUCT STRKRFLW 2 WTP (MISCELLANEOUS) ×2 IMPLANT
KIT BASIN OR (CUSTOM PROCEDURE TRAY) ×4 IMPLANT
KIT GASTRIC LAVAGE 34FR ADT (SET/KITS/TRAYS/PACK) ×4 IMPLANT
LIQUID BAND (GAUZE/BANDAGES/DRESSINGS) ×4 IMPLANT
LUBRICANT JELLY K Y 4OZ (MISCELLANEOUS) ×4 IMPLANT
MARKER SKIN DUAL TIP RULER LAB (MISCELLANEOUS) ×4 IMPLANT
NEEDLE SPNL 22GX3.5 QUINCKE BK (NEEDLE) ×4 IMPLANT
PACK CARDIOVASCULAR III (CUSTOM PROCEDURE TRAY) ×4 IMPLANT
QUICK LOAD TK 5 (STAPLE)
RELOAD 45 VASCULAR/THIN (ENDOMECHANICALS) IMPLANT
RELOAD ENDO STITCH 2.0 (ENDOMECHANICALS) ×28
RELOAD STAPLE TA45 3.5 REG BLU (ENDOMECHANICALS) IMPLANT
RELOAD STAPLER BLUE 60MM (STAPLE) ×4 IMPLANT
RELOAD STAPLER GOLD 60MM (STAPLE) ×2 IMPLANT
RELOAD STAPLER WHITE 60MM (STAPLE) ×4 IMPLANT
SCISSORS LAP 5X45 EPIX DISP (ENDOMECHANICALS) ×4 IMPLANT
SHEARS HARMONIC ACE PLUS 45CM (MISCELLANEOUS) ×4 IMPLANT
SLEEVE ADV FIXATION 12X100MM (TROCAR) ×8 IMPLANT
SLEEVE XCEL OPT CAN 5 100 (ENDOMECHANICALS) IMPLANT
SOLUTION ANTI FOG 6CC (MISCELLANEOUS) ×4 IMPLANT
STAPLER ECHELON BIOABSB 60 FLE (MISCELLANEOUS) IMPLANT
STAPLER ECHELON LONG 60 440 (INSTRUMENTS) ×4 IMPLANT
STAPLER RELOAD BLUE 60MM (STAPLE) ×8
STAPLER RELOAD GOLD 60MM (STAPLE) ×4
STAPLER RELOAD WHITE 60MM (STAPLE) ×8
SUT MNCRL AB 4-0 PS2 18 (SUTURE) ×4 IMPLANT
SUT RELOAD ENDO STITCH 2 48X1 (ENDOMECHANICALS) ×8
SUT RELOAD ENDO STITCH 2.0 (ENDOMECHANICALS) ×8
SUT SURGIDAC NAB ES-9 0 48 120 (SUTURE) IMPLANT
SUT VIC AB 2-0 SH 27 (SUTURE) ×4
SUT VIC AB 2-0 SH 27X BRD (SUTURE) ×2 IMPLANT
SUTURE RELOAD END STTCH 2 48X1 (ENDOMECHANICALS) ×8 IMPLANT
SUTURE RELOAD ENDO STITCH 2.0 (ENDOMECHANICALS) ×8 IMPLANT
SYR 10ML ECCENTRIC (SYRINGE) ×4 IMPLANT
SYR 20CC LL (SYRINGE) ×8 IMPLANT
TIP RIGID 35CM EVICEL (HEMOSTASIS) ×4 IMPLANT
TOWEL OR 17X26 10 PK STRL BLUE (TOWEL DISPOSABLE) ×4 IMPLANT
TOWEL OR NON WOVEN STRL DISP B (DISPOSABLE) ×4 IMPLANT
TRAY FOLEY W/METER SILVER 16FR (SET/KITS/TRAYS/PACK) IMPLANT
TROCAR ADV FIXATION 12X100MM (TROCAR) ×4 IMPLANT
TROCAR ADV FIXATION 5X100MM (TROCAR) ×4 IMPLANT
TROCAR BLADELESS OPT 5 100 (ENDOMECHANICALS) ×4 IMPLANT
TROCAR XCEL 12X100 BLDLESS (ENDOMECHANICALS) ×4 IMPLANT
TUBING CONNECTING 10 (TUBING) IMPLANT
TUBING CONNECTING 10' (TUBING)
TUBING ENDO SMARTCAP PENTAX (MISCELLANEOUS) ×4 IMPLANT
TUBING INSUF HEATED (TUBING) ×4 IMPLANT

## 2016-02-10 NOTE — Op Note (Signed)
02/10/2016  8:59 AM  PATIENT:  Bethany Scott  61 y.o. female  PRE-OPERATIVE DIAGNOSIS:  Morbid Obesity BMI 36,  Diabetes Mellitus 2  GERD Bilateral hip osteoarthritis Bilateral knee osteoarthritis Dyslipidemia  POST-OPERATIVE DIAGNOSIS:  Same + dense small bowel adhesions  PROCEDURE:  Procedure(s): LAPAROSCOPY DIAGNOSTIC LAPAROSCOPIC LYSIS OF ADHESIONS x 30 minutes  SURGEON:  Surgeon(s): [name], MD  ASSISTANTS: Johnathan Hausen MD   ANESTHESIA:   general  DRAINS: none   EBL: minimal  LOCAL MEDICATIONS USED:  OTHER exparel  SPECIMEN:  No Specimen  DISPOSITION OF SPECIMEN:  N/A  COUNTS:  YES  INDICATION FOR PROCEDURE: 61 year old morbidly obese Caucasian female presented for elective planned laparoscopic Roux-en-Y gastric bypass surgery. She had been unsuccessful at sustained weight loss for many years. Her comorbidities included diabetes mellitus type 2, hypertension, bilateral hip and knee osteoarthritis, gastroesophageal reflux disease, dyslipidemia. We have discussed risk and benefits extensively preoperatively. See outpatient notes for additional details  PROCEDURE: The patient was given 5000 units of subcutaneous heparin preoperatively for DVT prophylaxis. The patient was then taken to the operating room and placed supine on the operating room table. General endotracheal anesthesia was established. Sequential compression devices were placed. A Foley catheter was placed. Her abdomen was prepped and draped in the usual standard surgical fashion. She received IV antibiotics prior to skin incision. A surgical timeout was performed. Access to the abdomen was gained in the left upper quadrant through a subcostal incision. Using a 10 mm 0 laparoscope through a 12 mm trocar I advanced the laparoscope with the trocar through all layers of the abdominal wall and entered the abdominal cavity. Pneumoperitoneum was smoothly established. There are no change in vital signs. The  laparoscope was advanced and the abdominal cavity was surveilled. There were omental adhesions in the midline starting in the epigastric area extending down toward the pelvis. I placed a 12 mm trocar in the right upper quadrant. This area was free of adhesions. This was done and read visualization. I then switched laparoscope to that side the abdomen. The adhesions continued into the pelvis. Most of the adhesions to the anterior abdominal wall were omental but there is also some small bowel tethered upward as well. I was able to place an additional 5 mm trocar in the left lateral abdominal wall under direct visualization. I then started taking down the adhesions of the omentum with the harmonic scalpel. I ran out a room ergonomically sided up placing additional 5 mm trocar in the lower lateral left abdominal wall under direct visualization. I was able to free the omental adhesions from the anterior abdominal wall. There is a segment of omentum stuck in the left lower quadrant to a section of small bowel. Dr. Hassell Done join me in the operating room at this point. He lifted up on the omentum and I was able to disconnect the omentum from the small bowel in that location. We then placed the patient back in supine position. I had placed her in Trendelenburg to help with adhesio lysis in the lower abdomen. A 5 minute trocar was placed just above and to the  left of the umbilicus for the laparoscope. Prior to performing any stapling I wanted to run the small bowel to see if I had up enough length that did not have any evidence of interloop adhesions. We identified the ligament of Treitz. I started running the small bowel with atraumatic bowel graspers. At around 70 cm from the ligament of Treitz we encountered dense  intraloop small bowel adhesions. The small bowel also was tethered to the left lower quadrant and pelvis as well. It also appeared that the mesentery may be a little bit foreshortened in this area due to prior  abdominal surgery. Given how chronic and adhered these intraloop adhesions were I felt it would be too risky to continue to perform adhesio lysis. I felt that she would be at significant risk for enterotomy. Even if we were able to take down all the interloop small bowel adhesions I would be concerned about the mesentery being able to reach up to the gastric pouch. Therefore we decided not to proceed with gastric bypass surgery. Exparel was infiltrated in the lateral abdominal wall in the preperitoneal space as a tap block. I reinspected the area where I had lysed adhesions and there is no evidence of an enterotomy. Pneumoperitoneum was released and the trochars were removed. Skin incisions were closed with a 4-0 Monocryl in a subcuticular fashion followed by the application of Dermabond. All needle, instrument, and sponge counts were correct 2. The patient was awakened and taken to the recovery room in stable condition PLAN OF CARE: Discharge to home after PACU  PATIENT DISPOSITION:  PACU - hemodynamically stable.   Delay start of Pharmacological VTE agent (>24hrs) due to surgical blood loss or risk of bleeding:  not applicable  [name]. [name], MD, FACS General, Bariatric, & Minimally Invasive Surgery [facility], [place]

## 2016-02-10 NOTE — Anesthesia Preprocedure Evaluation (Addendum)
Anesthesia Evaluation  Patient identified by MRN, date of birth, ID band Patient awake    Reviewed: Allergy & Precautions, NPO status , Patient's Chart, lab work & pertinent test results  History of Anesthesia Complications (+) PONV and history of anesthetic complications  Airway Mallampati: I  TM Distance: >3 FB Neck ROM: Full    Dental  (+) Teeth Intact, Dental Advisory Given   Pulmonary asthma , former smoker,    Pulmonary exam normal breath sounds clear to auscultation       Cardiovascular hypertension, Pt. on medications Normal cardiovascular exam Rhythm:Regular Rate:Normal     Neuro/Psych  Headaches, PSYCHIATRIC DISORDERS Anxiety Depression    GI/Hepatic Neg liver ROS, PUD, GERD  Medicated,  Endo/Other  diabetes, Well Controlled, Type 2Obesity   Renal/GU negative Renal ROS     Musculoskeletal  (+) Arthritis , Osteoarthritis,    Abdominal   Peds  Hematology negative hematology ROS (+)   Anesthesia Other Findings Day of surgery medications reviewed with the patient.  Reproductive/Obstetrics                            Anesthesia Physical Anesthesia Plan  ASA: II  Anesthesia Plan: General   Post-op Pain Management:    Induction: Intravenous  Airway Management Planned: Oral ETT  Additional Equipment:   Intra-op Plan:   Post-operative Plan: Extubation in OR  Informed Consent: I have reviewed the patients History and Physical, chart, labs and discussed the procedure including the risks, benefits and alternatives for the proposed anesthesia with the patient or authorized representative who has indicated his/her understanding and acceptance.   Dental advisory given  Plan Discussed with: CRNA  Anesthesia Plan Comments: (Risks/benefits of general anesthesia discussed with patient including risk of damage to teeth, lips, gum, and tongue, nausea/vomiting, allergic reactions to  medications, and the possibility of heart attack, stroke and death.  All patient questions answered.  Patient wishes to proceed.)        Anesthesia Quick Evaluation

## 2016-02-10 NOTE — Interval H&P Note (Signed)
History and Physical Interval Note:  02/10/2016 7:11 AM  Bethany Scott  has presented today for surgery, with the diagnosis of Morbid Obesity, Diabtes, GERD  The various methods of treatment have been discussed with the patient and family. After consideration of risks, benefits and other options for treatment, the patient has consented to  Procedure(s): LAPAROSCOPIC ROUX-EN-Y GASTRIC BYPASS WITH UPPER ENDOSCOPY (N/A) as a surgical intervention .  The patient's history has been reviewed, patient examined, no change in status, stable for surgery.  I have reviewed the patient's chart and labs.  Questions were answered to the patient's satisfaction.    [name]. [name], MD, [place], Bariatric, & Minimally Invasive Surgery [facility], [place]   [facility]

## 2016-02-10 NOTE — Anesthesia Postprocedure Evaluation (Signed)
Anesthesia Post Note  Patient: SYRAE HINOTE  Procedure(s) Performed: Procedure(s) (LRB): LAPAROSCOPY DIAGNOSTIC (N/A) LAPAROSCOPIC LYSIS OF ADHESIONS (N/A)  Patient location during evaluation: PACU Anesthesia Type: General Level of consciousness: awake and alert Pain management: pain level controlled Vital Signs Assessment: post-procedure vital signs reviewed and stable Respiratory status: spontaneous breathing, nonlabored ventilation, respiratory function stable and patient connected to nasal cannula oxygen Cardiovascular status: blood pressure returned to baseline and stable Postop Assessment: no signs of nausea or vomiting Anesthetic complications: no       Last Vitals:  Vitals:   02/10/16 1004 02/10/16 1045  BP: 126/71 140/73  Pulse: 92 100  Resp: 12 16  Temp: 36.6 C 36.4 C    Last Pain:  Vitals:   02/10/16 1045  TempSrc:   PainSc: [place]

## 2016-02-10 NOTE — Transfer of Care (Signed)
Immediate Anesthesia Transfer of Care Note  Patient: Bethany Scott  Procedure(s) Performed: Procedure(s): LAPAROSCOPY DIAGNOSTIC (N/A) LAPAROSCOPIC LYSIS OF ADHESIONS (N/A)  Patient Location: PACU  Anesthesia Type:General  Level of Consciousness: awake, alert , oriented and patient cooperative  Airway & Oxygen Therapy: Patient Spontanous Breathing and Patient connected to face mask oxygen  Post-op Assessment: Report given to RN and Post -op Vital signs reviewed and stable  Post vital signs: Reviewed and stable  Last Vitals:  Vitals:   02/10/16 0558  BP: (!) 145/69  Pulse: 97  Resp: 18  Temp: 36.6 C    Last Pain:  Vitals:   02/10/16 0558  TempSrc: Oral      Patients Stated Pain Goal: 3 ([id number])  Complications: No apparent anesthesia complications

## 2016-02-10 NOTE — Progress Notes (Signed)
Dr. [name] talked with patient about her surgery.

## 2016-02-10 NOTE — H&P (View-Only) (Signed)
Bethany Scott 02/06/2016 3:31 PM Location: [facility] Patient #: [id number] DOB: [birth year] Married / Language: English / Race: White Female  History of Present Illness [name] Maleigh Bagot MD; 02/06/2016 4:47 PM) The patient is a 61 year old female who presents for a pre-op visit. She comes in today for a preoperative appointment. She has been scheduled in approved for laparoscopic Roux-en-Y gastric bypass on January 16. I initially met her in July. Her weight at that time was 236 pounds. She denies any medical changes since she was last seen. She denies any trips the emergency room hospital. She was referred to cardiology and underwent a nuclear stress test which was found to be a low risk study. Her upper GI and chest x-ray right unremarkable. Hemoglobin A1c was 5.9. Other bariatric labs such as conference a metabolic panel, CBC and vitamin levels were within normal limits. Thiamine level was at low normal. She denies any chest pain, chest pressure, source of breath, orthopnea, dyspnea on exertion, prior blood clots, melena, hematochezia, abdominal pain, TIAs or amaurosis fugax   Problem List/Past Medical [name] 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 [name], MD; 02/06/2016 4:48 PM) Appendectomy Cesarean Section - Multiple Colon Polyp Removal - Open Colon Removal - Partial Tonsillectomy  Diagnostic Studies History [name] [name], MD; 02/06/2016 4:48 PM) Colonoscopy 1-5 years ago Mammogram 1-3 years ago Pap Smear 1-5 years ago  Allergies Malachy Moan, RMA; 02/06/2016 3:31 PM) Penicillins  Anaphylaxis. Cephalosporins Anaphylaxis.  Medication History [name] [name], MD; 02/06/2016 4:48 PM) Qvar (80MCG/ACT Aerosol Soln, Inhalation) Active. Atorvastatin Calcium (20MG Tablet, Oral) Active. Hydrocodone-Acetaminophen (7.5-325MG Tablet, Oral) Active. TraMADol HCl (50MG Tablet, Oral) Active. DULoxetine HCl (60MG Capsule DR Part, Oral) Active. Lisinopril (5MG Tablet, Oral) Active. Montelukast Sodium (10MG Tablet, Oral) Active. Nitrofurantoin Monohyd Macro (100MG Capsule, Oral) Active. Pantoprazole Sodium (40MG Tablet DR, Oral) Active. Phenazopyridine HCl (200MG Tablet, Oral) Active. ZyrTEC (10MG Tablet, Oral) Active. Multivitamin Adult (Oral) Active. Maxalt (10MG Tablet, Oral) Active. Medications Reconciled OxyCODONE HCl (5MG/5ML Solution, 5-10 Milliliter Oral every four hours, as needed, Taken starting 02/06/2016) Active. Zofran ODT (4MG Tablet Disint, 1 (one) Tablet Disperse Oral every six hours, as needed, Taken starting 02/06/2016) Active. Protonix (40MG Tablet DR, 1 (one) Tablet Oral daily, Taken starting 02/06/2016) Active.  Social History [name] [name], MD; 02/06/2016 4:48 PM) Alcohol use Occasional alcohol use. Caffeine use Carbonated beverages, Tea. No drug use Tobacco use Former smoker.  Family History [name] [name], MD; 02/06/2016 4:48 PM) Alcohol Abuse Brother. Arthritis Father. Depression Father. Diabetes Mellitus Father. Hypertension Father. Migraine Headache Mother, [name], [name]. Respiratory Condition Mother. Thyroid problems Mother, Sister.  Pregnancy / Birth History [name] [name], MD; 02/06/2016 4:48 PM) Age at menarche 30 years. Gravida 2 Maternal age 47-30 Para 2  Other Problems [name]. [name], MD; 02/06/2016 4:48 PM) Arthritis Asthma Back Pain Bladder Problems Depression Diabetes Mellitus Diverticulosis Gastroesophageal Reflux Disease Hemorrhoids High blood  pressure Hypercholesterolemia Migraine Headache Oophorectomy Left.     Review of Systems [name] Norman Bier MD; 02/06/2016 4:39 PM) General Not Present- Appetite Loss, Chills, Fatigue, Fever, Night Sweats, Weight Gain and Weight Loss. Skin Not Present- Change in Wart/Mole, Dryness, Hives, Jaundice, New Lesions, Non-Healing Wounds, Rash and Ulcer. HEENT Present-  Hoarseness and Seasonal Allergies. Not Present- Earache, Hearing Loss, Nose Bleed, Oral Ulcers, Ringing in the Ears, Sinus Pain, Sore Throat, Visual Disturbances, Wears glasses/contact lenses and Yellow Eyes. Respiratory Present- Snoring. Not Present- Bloody sputum, Chronic Cough, Difficulty Breathing and Wheezing. Cardiovascular Not Present- Chest Pain, Difficulty Breathing Lying Down, Leg Cramps, Palpitations, Rapid Heart Rate, Shortness of Breath and Swelling of Extremities. Gastrointestinal Not Present- Abdominal Pain, Bloating, Bloody Stool, Change in Bowel Habits, Chronic diarrhea, Constipation, Difficulty Swallowing, Excessive gas, Gets full quickly at meals, Hemorrhoids, Indigestion, Nausea, Rectal Pain and Vomiting. Female Genitourinary Not Present- Frequency, Nocturia, Painful Urination, Pelvic Pain and Urgency. Musculoskeletal Present- Back Pain, Joint Pain and Muscle Pain. Not Present- Joint Stiffness, Muscle Weakness and Swelling of Extremities. Neurological Present- Headaches. Not Present- Decreased Memory, Fainting, Numbness, Seizures, Tingling, Tremor, Trouble walking and Weakness. Psychiatric Present- Depression. Not Present- Anxiety, Bipolar, Change in Sleep Pattern, Fearful and Frequent crying. Hematology Present- Easy Bruising. Not Present- Excessive bleeding, Gland problems, HIV and Persistent Infections.  Vitals Malachy Moan RMA; 02/06/2016 3:32 PM) 02/06/2016 3:31 PM Weight: 230.4 lb Height: 67in Body Surface Area: 2.15 m Body Mass Index: 36.09 kg/m  Temp.: 97.42F  Pulse: 96 (Regular)  BP:  130/80 (Sitting, Left Arm, Standard)      Physical Exam [name] Kloi Brodman MD; 02/06/2016 4:39 PM)  General Mental Status-Alert. General Appearance-Consistent with stated age. Hydration-Well hydrated. Voice-Normal. Note: morbidly obese  Head and Neck Head-normocephalic, atraumatic with no lesions or palpable masses. Trachea-midline. Thyroid Gland Characteristics - normal size and consistency.  Eye Eyeball - Bilateral-Extraocular movements intact. Sclera/Conjunctiva - Bilateral-No scleral icterus.  Chest and Lung Exam Chest and lung exam reveals -quiet, even and easy respiratory effort with no use of accessory muscles and on auscultation, normal breath sounds, no adventitious sounds and normal vocal resonance. Inspection Chest Wall - Normal. Back - normal.  Breast - Did not examine.  Cardiovascular Cardiovascular examination reveals -normal heart sounds, regular rate and rhythm with no murmurs and normal pedal pulses bilaterally.  Abdomen Inspection Inspection of the abdomen reveals - No Hernias. Skin - Scar - Note: lower midline incision; Pfannenstiel incision. Palpation/Percussion Palpation and Percussion of the abdomen reveal - Soft, Non Tender, No Rebound tenderness, No Rigidity (guarding) and No hepatosplenomegaly. Auscultation Auscultation of the abdomen reveals - Bowel sounds normal.  Peripheral Vascular Upper Extremity Palpation - Pulses bilaterally normal.  Neurologic Neurologic evaluation reveals -alert and oriented x 3 with no impairment of recent or remote memory. Mental Status-Normal.  Neuropsychiatric The patient's mood and affect are described as -normal. Judgment and Insight-insight is appropriate concerning matters relevant to self.  Musculoskeletal Normal Exam - Left-Upper Extremity Strength Normal and Lower Extremity Strength Normal. Normal Exam - Right-Upper Extremity Strength Normal and Lower Extremity Strength  Normal. Note: Bilateral knee crepitus  Lymphatic Head & Neck  General Head & Neck Lymphatics: Bilateral - Description - Normal. Axillary - Did not examine. Femoral & Inguinal - Did not examine.    Assessment & Plan [name] Akosua Constantine MD; 02/06/2016 4:48 PM)  OBESITY (BMI 30-39.9) (E66.9) Impression: We reviewed her preoperative workup. We rediscussed the typical hospitalization and postoperative course. We discussed the typical diet progression after surgery. She was given her postoperative prescriptions today. She is also given instructions for the bowel prep. All of her questions were asked and answered. We discussed the importance of preoperative meal plan  Current Plans Pt Education - EMW_preopbariatric DIABETES MELLITUS TYPE 2 IN OBESE (E11.69)  HYPERTENSION, BENIGN (I10)  PRIMARY OSTEOARTHRITIS OF BOTH  HIPS (M16.0)  OSTEOARTHRITIS OF LOWER BACK (M47.9)  DYSLIPIDEMIA (E78.5)  GASTROESOPHAGEAL REFLUX DISEASE, ESOPHAGITIS PRESENCE NOT SPECIFIED (K21.9)  PRIMARY OSTEOARTHRITIS OF BOTH KNEES (M17.0)  [name]. [name], MD, FACS General, Bariatric, & Minimally Invasive Surgery [facility], [place]

## 2016-02-10 NOTE — Progress Notes (Signed)
Dr. [name] talked with patient again.

## 2016-02-10 NOTE — Discharge Instructions (Signed)
[facility] °LAPAROSCOPIC SURGERY: POST OP INSTRUCTIONS °Always review your discharge instruction sheet given to you by the facility where your surgery was performed. °IF YOU HAVE DISABILITY OR FAMILY LEAVE FORMS, YOU MUST BRING THEM TO THE OFFICE FOR PROCESSING.   °DO NOT GIVE THEM TO YOUR DOCTOR. ° °1. A prescription for pain medication may be given to you upon discharge.  Take your pain medication as prescribed, if needed.  If narcotic pain medicine is not needed, then you may take acetaminophen (Tylenol) or ibuprofen (Advil) as needed. °2. Take your usually prescribed medications unless otherwise directed. °3. If you need a refill on your pain medication, please contact your pharmacy.  They will contact our office to request authorization. Prescriptions will not be filled after 5pm or on week-ends. °4. You should follow a light diet the first few days after arrival home, such as soup and crackers, etc.  Be sure to include lots of fluids daily. °5. Most patients will experience some swelling and bruising in the area of the incisions.  Ice packs will help.  Swelling and bruising can take several days to resolve.  °6. It is common to experience some constipation if taking pain medication after surgery.  Increasing fluid intake and taking a stool softener (such as Colace) will usually help or prevent this problem from occurring.  A mild laxative (Milk of Magnesia or Miralax) should be taken according to package instructions if there are no bowel movements after 48 hours. °7. If your surgeon used skin glue on the incision, you may shower in 24 hours.  The glue will flake off over the next 2-3 weeks.  Any sutures or staples will be removed at the office during your follow-up visit. °8. ACTIVITIES:  You may resume regular (light) daily activities beginning the next day--such as daily self-care, walking, climbing stairs--gradually increasing activities as tolerated.  You may have sexual  intercourse when it is comfortable.  Refrain from any heavy lifting or straining until approved by your doctor. °a. You may drive when you are no longer taking prescription pain medication, you can comfortably wear a seatbelt, and you can safely maneuver your car and apply brakes. °9. You should see your doctor in the office for a follow-up appointment approximately 2-3 weeks after your surgery.  Make sure that you call for this appointment within a day or two after you arrive home to insure a convenient appointment time. °10. OTHER INSTRUCTIONS:  °WHEN TO CALL YOUR DOCTOR: °1. Fever over 101.0 °2. Inability to urinate °3. Continued bleeding from incision. °4. Increased pain, redness, or drainage from the incision. °5. Increasing abdominal pain ° °The clinic staff is available to answer your questions during regular business hours.  Please don’t hesitate to call and ask to speak to one of the nurses for clinical concerns.  If you have a medical emergency, go to the nearest emergency room or call 911.  A surgeon from [facility] is always on call at the hospital. °[address], [address], [place], [place]  [address] ? [address] [address], [place], [place]   [address] °[phone] ? [phone] ? FAX [phone] °Web site: [email or web address] ° °

## 2016-02-10 NOTE — Anesthesia Procedure Notes (Signed)
Procedure Name: Intubation Date/Time: 02/10/2016 7:25 AM Performed by: Dione Booze Pre-anesthesia Checklist: Emergency Drugs available, Suction available, Patient being monitored and Patient identified Patient Re-evaluated:Patient Re-evaluated prior to inductionOxygen Delivery Method: Circle system utilized Preoxygenation: Pre-oxygenation with 100% oxygen Intubation Type: IV induction Laryngoscope Size: Mac and 4 Grade View: Grade I Tube type: Oral Tube size: 7.5 mm Number of attempts: 1 Airway Equipment and Method: Stylet Placement Confirmation: ETT inserted through vocal cords under direct vision,  positive ETCO2 and breath sounds checked- equal and bilateral Secured at: 22 cm Tube secured with: Tape Dental Injury: Teeth and Oropharynx as per pre-operative assessment

## 2016-02-10 NOTE — Progress Notes (Signed)
Patient has cried off and on during PACU stay because surgery could not be completed- understand why- Dr. [name] has talked to patient X2- upon discharge from PACU- patient crying less- beginning to feel better.

## 2016-02-24 ENCOUNTER — Ambulatory Visit: Payer: 59

## 2016-02-24 NOTE — Progress Notes (Signed)
Please place SURGICAL ORDERS IN EPIC--scheduling pre op

## 2016-02-25 ENCOUNTER — Other Ambulatory Visit: Payer: Self-pay

## 2016-02-25 NOTE — Patient Outreach (Signed)
[facility]) Care Management  02/25/2016  PERLA EBLE [birth year] UK:[id number]   Subjective: none.  Objective: Per chart review, patient scheduled for Gastric sleeve resection on 03/02/16. History of HTN, hyperlipidemia, migraine.   Assessment:  Received [facility] Presurgical call referral on 02/24/16. Telephone call to patient's home / mobile number, no answer. HIPPA compliant voice message left. Presurgical call pending patient contact.   Plan: RNCM will call patient for 2nd telephone outreach attempt within the week if no return call.   Thea Silversmith, RN, MSN, [place] Coordinator Cell: [phone]

## 2016-02-25 NOTE — Patient Outreach (Signed)
[facility]) Care Management  02/25/2016  ORLEAN DOUSE 01-Aug-1955 UK:[id number]  Subjective: Telephone call to patient. Discussed [facility] pre-op follow up. Patient voices understanding and agrees to pre-op call.    Objective: per chart review-Patient with Morbid obesity due to excess calories-to be admitted on 03/02/16 for Gastric Sleeve Resection.  Assessment: received [facility] pre-op call referral on 02/24/16. Pre op call completed. 61 year old with history of HTN, hyperlipidemia, migraine reports that her FMLA forms are completed. RNCM encouraged to keep a copy for records.   Mrs. Meston has not applied for Short term disability and is not sure of her benefit package. RNCM provided the Benefits service center contact number. Mrs. Enke to follow up.   RNCM reinforced that the inpatient case manager will arrange any equipment/home health needs prior to discharge.  Support-Mrs. Drucker reports her husband and son are available for support and transportation. Client questioned if concierge could be used to pick up medications from the pharmacy-Mrs. Kenison states she will call the concierge prior to procedure to ask this question. RNCM encouraged Mrs. Villalona to have someone available for transportation to follow up appointment and to be able to pick up any new medications ordered. Mrs. Diglio acknowledged understanding.   RNCM reinforced [place] benefit is higher when using a [place] facility/pharmacy. However, reinforced she has a choice of facility/agency.    Discussed Advanced directives-Mrs Dehn states that she has not completed. Request a packet.   No other medical issues identified and no additional community resource information needs at this time.   Patient is agreeable to follow up post procedure call.  Plan: telephonic RNCM will follow up within 3 business days of notification. [facility] liaison follow up with patient during hospitalization and provide  packet.  Thea Silversmith, RN, MSN, [place] Coordinator Cell: [phone]

## 2016-02-26 ENCOUNTER — Encounter (HOSPITAL_COMMUNITY): Payer: Self-pay

## 2016-02-26 NOTE — Progress Notes (Signed)
HEMAGLOBIN A1C 02-09-16 EPIC CHEST XRAY 11-14-15 EPIC STRESS TEST 11-14-15 EPIC EKG 09-19-15 EPIC

## 2016-02-26 NOTE — Patient Instructions (Addendum)
Bethany Scott  02/26/2016  Your procedure is scheduled on: 03-01-16  Report to [facility]  Entrance take [facility]  elevators to 3rd floor to  [place] at 830 AM.  Call this number if you have problems the morning of surgery [phone]   Remember: ONLY 1 PERSON MAY GO WITH YOU TO SHORT STAY TO GET  READY MORNING OF [place].  Do not eat food or drink liquids :After Midnight.    Take these medicines the morning of surgery with A SIP OF WATER: QVAR INHALER IF NEEDED, DULOXETINE (CYMBALTA), HYDROCODNE IF NEEDED, MONTELUKAST (SINGULAIR), PANTAPRAZOLE (PROTONIX), ZYRTEC                                You may not have any metal on your body including hair pins and              piercings  Do not wear jewelry, make-up, lotions, powders or perfumes, deodorant             Do not wear nail polish.  Do not shave  48 hours prior to surgery.              Men may shave face and neck.   Do not bring valuables to the hospital. [place].  Contacts, dentures or bridgework may not be worn into surgery.  Leave suitcase in the car. After surgery it may be brought to your room.                  Please read over the following fact sheets you were given: _____________________________________________________________________             [facility] - Preparing for Surgery Before surgery, you can play an important role.  Because skin is not sterile, your skin needs to be as free of germs as possible.  You can reduce the number of germs on your skin by washing with CHG (chlorahexidine gluconate) soap before surgery.  CHG is an antiseptic cleaner which kills germs and bonds with the skin to continue killing germs even after washing. Please DO NOT use if you have an allergy to CHG or antibacterial soaps.  If your skin becomes reddened/irritated stop using the CHG and inform your nurse when you arrive at Short Stay. Do not  shave (including legs and underarms) for at least 48 hours prior to the first CHG shower.  You may shave your face/neck. Please follow these instructions carefully:  1.  Shower with CHG Soap the night before surgery and the  morning of Surgery.  2.  If you choose to wash your hair, wash your hair first as usual with your  normal  shampoo.  3.  After you shampoo, rinse your hair and body thoroughly to remove the  shampoo.                           4.  Use CHG as you would any other liquid soap.  You can apply chg directly  to the skin and wash                       Gently with a  scrungie or clean washcloth.  5.  Apply the CHG Soap to your body ONLY FROM THE NECK DOWN.   Do not use on face/ open                           Wound or open sores. Avoid contact with eyes, ears mouth and genitals (private parts).                       Wash face,  Genitals (private parts) with your normal soap.             6.  Wash thoroughly, paying special attention to the area where your surgery  will be performed.  7.  Thoroughly rinse your body with warm water from the neck down.  8.  DO NOT shower/wash with your normal soap after using and rinsing off  the CHG Soap.                9.  Pat yourself dry with a clean towel.            10.  Wear clean pajamas.            11.  Place clean sheets on your bed the night of your first shower and do not  sleep with pets. Day of Surgery : Do not apply any lotions/deodorants the morning of surgery.  Please wear clean clothes to the hospital/surgery center.  FAILURE TO FOLLOW THESE INSTRUCTIONS MAY RESULT IN THE CANCELLATION OF YOUR SURGERY PATIENT SIGNATURE_________________________________  NURSE SIGNATURE__________________________________  ________________________________________________________________________

## 2016-02-27 ENCOUNTER — Encounter (HOSPITAL_COMMUNITY): Payer: Self-pay

## 2016-02-27 ENCOUNTER — Encounter (HOSPITAL_COMMUNITY)
Admission: RE | Admit: 2016-02-27 | Discharge: 2016-02-27 | Disposition: A | Discharge status: Home / Self Care | Payer: 59 | Source: Ambulatory Visit | Attending: General Surgery | Admitting: General Surgery

## 2016-02-27 DIAGNOSIS — M160 Bilateral primary osteoarthritis of hip: Secondary | ICD-10-CM | POA: Diagnosis not present

## 2016-02-27 DIAGNOSIS — Z90710 Acquired absence of both cervix and uterus: Secondary | ICD-10-CM | POA: Diagnosis not present

## 2016-02-27 DIAGNOSIS — E785 Hyperlipidemia, unspecified: Secondary | ICD-10-CM | POA: Diagnosis not present

## 2016-02-27 DIAGNOSIS — G43909 Migraine, unspecified, not intractable, without status migrainosus: Secondary | ICD-10-CM | POA: Diagnosis not present

## 2016-02-27 DIAGNOSIS — E119 Type 2 diabetes mellitus without complications: Secondary | ICD-10-CM | POA: Diagnosis not present

## 2016-02-27 DIAGNOSIS — K219 Gastro-esophageal reflux disease without esophagitis: Secondary | ICD-10-CM | POA: Diagnosis not present

## 2016-02-27 DIAGNOSIS — I10 Essential (primary) hypertension: Secondary | ICD-10-CM | POA: Diagnosis not present

## 2016-02-27 DIAGNOSIS — M170 Bilateral primary osteoarthritis of knee: Secondary | ICD-10-CM | POA: Diagnosis not present

## 2016-02-27 HISTORY — DX: Prediabetes: R73.03

## 2016-02-27 LAB — COMPREHENSIVE METABOLIC PANEL
ALBUMIN: 4.6 g/dL (ref 3.5–5.0)
ALT: 20 U/L (ref 14–54)
ANION GAP: 7 (ref 5–15)
AST: 20 U/L (ref 15–41)
Alkaline Phosphatase: 73 U/L (ref 38–126)
BUN: 15 mg/dL (ref 6–20)
CO2: 28 mmol/L (ref 22–32)
Calcium: 9.7 mg/dL (ref 8.9–10.3)
Chloride: 103 mmol/L (ref 101–111)
Creatinine, Ser: 0.67 mg/dL (ref 0.44–1.00)
GFR calc non Af Amer: >60 mL/min (ref >60)
GLUCOSE: 93 mg/dL (ref 65–99)
POTASSIUM: 4.4 mmol/L (ref 3.5–5.1)
SODIUM: 138 mmol/L (ref 135–145)
Total Bilirubin: 0.6 mg/dL (ref 0.3–1.2)
Total Protein: 7.8 g/dL (ref 6.5–8.1)

## 2016-02-27 LAB — CBC
HCT: 42.8 % (ref 36.0–46.0)
HEMOGLOBIN: 14.1 g/dL (ref 12.0–15.0)
MCH: 29 pg (ref 26.0–34.0)
MCHC: 32.9 g/dL (ref 30.0–36.0)
MCV: 87.9 fL (ref 78.0–100.0)
PLATELETS: 331 10*3/uL (ref 150–400)
RBC: 4.87 MIL/uL (ref 3.87–5.11)
RDW: 12.9 % (ref 11.5–15.5)
WBC: 7.9 10*3/uL (ref 4.0–10.5)

## 2016-03-01 ENCOUNTER — Inpatient Hospital Stay (HOSPITAL_COMMUNITY): Payer: 59 | Admitting: Anesthesiology

## 2016-03-01 ENCOUNTER — Encounter (HOSPITAL_COMMUNITY)
Admission: RE | Disposition: A | Discharge status: Home / Self Care | Payer: Self-pay | Source: Ambulatory Visit | Attending: General Surgery

## 2016-03-01 ENCOUNTER — Inpatient Hospital Stay (HOSPITAL_COMMUNITY)
Admission: RE | Admit: 2016-03-01 | Discharge: 2016-03-03 | DRG: 621 | Disposition: A | Discharge status: Home / Self Care | Payer: 59 | Source: Ambulatory Visit | Attending: General Surgery | Admitting: General Surgery

## 2016-03-01 ENCOUNTER — Encounter (HOSPITAL_COMMUNITY): Payer: Self-pay | Admitting: *Deleted

## 2016-03-01 DIAGNOSIS — Z880 Allergy status to penicillin: Secondary | ICD-10-CM | POA: Diagnosis not present

## 2016-03-01 DIAGNOSIS — E119 Type 2 diabetes mellitus without complications: Secondary | ICD-10-CM | POA: Diagnosis not present

## 2016-03-01 DIAGNOSIS — E785 Hyperlipidemia, unspecified: Secondary | ICD-10-CM | POA: Diagnosis present

## 2016-03-01 DIAGNOSIS — M1710 Unilateral primary osteoarthritis, unspecified knee: Secondary | ICD-10-CM | POA: Diagnosis present

## 2016-03-01 DIAGNOSIS — G43909 Migraine, unspecified, not intractable, without status migrainosus: Secondary | ICD-10-CM | POA: Diagnosis present

## 2016-03-01 DIAGNOSIS — R112 Nausea with vomiting, unspecified: Secondary | ICD-10-CM | POA: Diagnosis not present

## 2016-03-01 DIAGNOSIS — Z90710 Acquired absence of both cervix and uterus: Secondary | ICD-10-CM

## 2016-03-01 DIAGNOSIS — M170 Bilateral primary osteoarthritis of knee: Secondary | ICD-10-CM | POA: Diagnosis present

## 2016-03-01 DIAGNOSIS — M461 Sacroiliitis, not elsewhere classified: Secondary | ICD-10-CM | POA: Diagnosis present

## 2016-03-01 DIAGNOSIS — Z6835 Body mass index (BMI) 35.0-35.9, adult: Secondary | ICD-10-CM

## 2016-03-01 DIAGNOSIS — E669 Obesity, unspecified: Secondary | ICD-10-CM | POA: Diagnosis present

## 2016-03-01 DIAGNOSIS — Z825 Family history of asthma and other chronic lower respiratory diseases: Secondary | ICD-10-CM

## 2016-03-01 DIAGNOSIS — K219 Gastro-esophageal reflux disease without esophagitis: Secondary | ICD-10-CM | POA: Diagnosis present

## 2016-03-01 DIAGNOSIS — Z87891 Personal history of nicotine dependence: Secondary | ICD-10-CM | POA: Diagnosis not present

## 2016-03-01 DIAGNOSIS — G4733 Obstructive sleep apnea (adult) (pediatric): Secondary | ICD-10-CM | POA: Diagnosis not present

## 2016-03-01 DIAGNOSIS — M169 Osteoarthritis of hip, unspecified: Secondary | ICD-10-CM | POA: Diagnosis present

## 2016-03-01 DIAGNOSIS — M160 Bilateral primary osteoarthritis of hip: Secondary | ICD-10-CM | POA: Diagnosis present

## 2016-03-01 DIAGNOSIS — Z8249 Family history of ischemic heart disease and other diseases of the circulatory system: Secondary | ICD-10-CM

## 2016-03-01 DIAGNOSIS — M47818 Spondylosis without myelopathy or radiculopathy, sacral and sacrococcygeal region: Secondary | ICD-10-CM

## 2016-03-01 DIAGNOSIS — Z833 Family history of diabetes mellitus: Secondary | ICD-10-CM

## 2016-03-01 DIAGNOSIS — Z9884 Bariatric surgery status: Secondary | ICD-10-CM

## 2016-03-01 DIAGNOSIS — M179 Osteoarthritis of knee, unspecified: Secondary | ICD-10-CM | POA: Diagnosis present

## 2016-03-01 DIAGNOSIS — E7800 Pure hypercholesterolemia, unspecified: Secondary | ICD-10-CM | POA: Diagnosis present

## 2016-03-01 DIAGNOSIS — Z818 Family history of other mental and behavioral disorders: Secondary | ICD-10-CM

## 2016-03-01 DIAGNOSIS — Z881 Allergy status to other antibiotic agents status: Secondary | ICD-10-CM | POA: Diagnosis not present

## 2016-03-01 DIAGNOSIS — Z811 Family history of alcohol abuse and dependence: Secondary | ICD-10-CM

## 2016-03-01 DIAGNOSIS — K317 Polyp of stomach and duodenum: Secondary | ICD-10-CM | POA: Diagnosis not present

## 2016-03-01 DIAGNOSIS — I10 Essential (primary) hypertension: Secondary | ICD-10-CM | POA: Diagnosis present

## 2016-03-01 DIAGNOSIS — Z801 Family history of malignant neoplasm of trachea, bronchus and lung: Secondary | ICD-10-CM | POA: Diagnosis not present

## 2016-03-01 DIAGNOSIS — M5136 Other intervertebral disc degeneration, lumbar region: Secondary | ICD-10-CM | POA: Diagnosis present

## 2016-03-01 DIAGNOSIS — E1169 Type 2 diabetes mellitus with other specified complication: Secondary | ICD-10-CM | POA: Diagnosis present

## 2016-03-01 HISTORY — PX: LAPAROSCOPIC GASTRIC SLEEVE RESECTION: SHX5895

## 2016-03-01 LAB — HEMOGLOBIN AND HEMATOCRIT, BLOOD
HCT: 40.3 % (ref 36.0–46.0)
HEMOGLOBIN: 13.6 g/dL (ref 12.0–15.0)

## 2016-03-01 LAB — GLUCOSE, CAPILLARY
GLUCOSE-CAPILLARY: 179 mg/dL — AB (ref 65–99)
Glucose-Capillary: 150 mg/dL — ABNORMAL HIGH (ref 65–99)
Glucose-Capillary: 174 mg/dL — ABNORMAL HIGH (ref 65–99)
Glucose-Capillary: 143 mg/dL — ABNORMAL HIGH (ref 65–99)

## 2016-03-01 SURGERY — GASTRECTOMY, SLEEVE, LAPAROSCOPIC
Anesthesia: General

## 2016-03-01 MED ORDER — SUGAMMADEX SODIUM 200 MG/2ML IV SOLN
INTRAVENOUS | Status: AC
  Filled 2016-03-01: qty 2

## 2016-03-01 MED ORDER — PHENYLEPHRINE 40 MCG/ML (10ML) SYRINGE FOR IV PUSH (FOR BLOOD PRESSURE SUPPORT)
PREFILLED_SYRINGE | INTRAVENOUS | Status: AC
  Filled 2016-03-01: qty 10

## 2016-03-01 MED ORDER — DIPHENHYDRAMINE HCL 50 MG/ML IJ SOLN: 12.5000 mg | Freq: Three times a day (TID) | INTRAMUSCULAR | Status: DC | PRN

## 2016-03-01 MED ORDER — FENTANYL CITRATE (PF) 100 MCG/2ML IJ SOLN
INTRAMUSCULAR | Status: AC
  Filled 2016-03-01: qty 2

## 2016-03-01 MED ORDER — LACTATED RINGERS IV SOLN
INTRAVENOUS | Status: DC | PRN
  Administered 2016-03-01 (×2): via INTRAVENOUS

## 2016-03-01 MED ORDER — LIDOCAINE 2% (20 MG/ML) 5 ML SYRINGE
INTRAMUSCULAR | Status: DC | PRN
  Administered 2016-03-01: 100 mg via INTRAVENOUS

## 2016-03-01 MED ORDER — ROCURONIUM BROMIDE 50 MG/5ML IV SOSY
PREFILLED_SYRINGE | INTRAVENOUS | Status: AC
  Filled 2016-03-01: qty 5

## 2016-03-01 MED ORDER — OXYCODONE HCL 5 MG/5ML PO SOLN
5.0000 mg | Freq: Once | ORAL | Status: DC | PRN
  Filled 2016-03-01: qty 5

## 2016-03-01 MED ORDER — ACETAMINOPHEN 10 MG/ML IV SOLN
1000.0000 mg | Freq: Four times a day (QID) | INTRAVENOUS | Status: DC
  Administered 2016-03-01: 1000 mg via INTRAVENOUS

## 2016-03-01 MED ORDER — SUGAMMADEX SODIUM 200 MG/2ML IV SOLN
INTRAVENOUS | Status: DC | PRN
  Administered 2016-03-01: 200 mg via INTRAVENOUS

## 2016-03-01 MED ORDER — ENOXAPARIN SODIUM 30 MG/0.3ML ~~LOC~~ SOLN
30.0000 mg | Freq: Two times a day (BID) | SUBCUTANEOUS | Status: DC
  Administered 2016-03-02 – 2016-03-03 (×3): 30 mg via SUBCUTANEOUS
  Filled 2016-03-01 (×4): qty 0.3

## 2016-03-01 MED ORDER — PHENYLEPHRINE 40 MCG/ML (10ML) SYRINGE FOR IV PUSH (FOR BLOOD PRESSURE SUPPORT)
PREFILLED_SYRINGE | INTRAVENOUS | Status: DC | PRN
  Administered 2016-03-01 (×2): 80 ug via INTRAVENOUS

## 2016-03-01 MED ORDER — LEVOFLOXACIN IN D5W 750 MG/150ML IV SOLN
750.0000 mg | Freq: Once | INTRAVENOUS | Status: AC
  Administered 2016-03-01: 750 mg via INTRAVENOUS
  Filled 2016-03-01: qty 150

## 2016-03-01 MED ORDER — ONDANSETRON HCL 4 MG/2ML IJ SOLN
INTRAMUSCULAR | Status: AC
  Filled 2016-03-01: qty 2

## 2016-03-01 MED ORDER — ONDANSETRON HCL 4 MG/2ML IJ SOLN
4.0000 mg | Freq: Once | INTRAMUSCULAR | Status: AC | PRN
  Administered 2016-03-01: 4 mg via INTRAVENOUS

## 2016-03-01 MED ORDER — ACETAMINOPHEN 160 MG/5ML PO SOLN
1000.0000 mg | ORAL | Status: AC
  Administered 2016-03-01: 1000 mg via ORAL
  Filled 2016-03-01: qty 40

## 2016-03-01 MED ORDER — SUCCINYLCHOLINE CHLORIDE 200 MG/10ML IV SOSY
PREFILLED_SYRINGE | INTRAVENOUS | Status: DC | PRN
  Administered 2016-03-01: 140 mg via INTRAVENOUS

## 2016-03-01 MED ORDER — SODIUM CHLORIDE 0.9 % IJ SOLN
INTRAMUSCULAR | Status: AC
  Filled 2016-03-01: qty 50

## 2016-03-01 MED ORDER — DEXAMETHASONE SODIUM PHOSPHATE 10 MG/ML IJ SOLN
INTRAMUSCULAR | Status: AC
  Filled 2016-03-01: qty 1

## 2016-03-01 MED ORDER — ONDANSETRON HCL 4 MG/2ML IJ SOLN
4.0000 mg | Freq: Four times a day (QID) | INTRAMUSCULAR | Status: DC | PRN
  Administered 2016-03-01 – 2016-03-03 (×5): 4 mg via INTRAVENOUS
  Filled 2016-03-01 (×7): qty 2

## 2016-03-01 MED ORDER — INSULIN ASPART 100 UNIT/ML ~~LOC~~ SOLN
0.0000 [IU] | SUBCUTANEOUS | Status: DC
  Administered 2016-03-01: 3 [IU] via SUBCUTANEOUS
  Administered 2016-03-01: 2 [IU] via SUBCUTANEOUS
  Administered 2016-03-01: 3 [IU] via SUBCUTANEOUS
  Administered 2016-03-02 – 2016-03-03 (×4): 2 [IU] via SUBCUTANEOUS

## 2016-03-01 MED ORDER — PANTOPRAZOLE SODIUM 40 MG IV SOLR
40.0000 mg | Freq: Every day | INTRAVENOUS | Status: DC
  Administered 2016-03-01 – 2016-03-02 (×2): 40 mg via INTRAVENOUS
  Filled 2016-03-01 (×2): qty 40

## 2016-03-01 MED ORDER — POTASSIUM CHLORIDE IN NACL 20-0.45 MEQ/L-% IV SOLN
INTRAVENOUS | Status: DC
  Administered 2016-03-01: 125 mL/h via INTRAVENOUS
  Filled 2016-03-01 (×3): qty 1000

## 2016-03-01 MED ORDER — ACETAMINOPHEN 10 MG/ML IV SOLN
INTRAVENOUS | Status: AC
  Filled 2016-03-01: qty 100

## 2016-03-01 MED ORDER — LIDOCAINE 2% (20 MG/ML) 5 ML SYRINGE
INTRAMUSCULAR | Status: AC
  Filled 2016-03-01: qty 5

## 2016-03-01 MED ORDER — PREMIER PROTEIN SHAKE
2.0000 [oz_av] | ORAL | Status: DC
  Administered 2016-03-03 (×2): 2 [oz_av] via ORAL

## 2016-03-01 MED ORDER — PROMETHAZINE HCL 25 MG/ML IJ SOLN
12.5000 mg | Freq: Four times a day (QID) | INTRAMUSCULAR | Status: DC | PRN
  Administered 2016-03-01 – 2016-03-02 (×4): 12.5 mg via INTRAVENOUS
  Filled 2016-03-01 (×4): qty 1

## 2016-03-01 MED ORDER — MIDAZOLAM HCL 2 MG/2ML IJ SOLN
INTRAMUSCULAR | Status: AC
  Filled 2016-03-01: qty 2

## 2016-03-01 MED ORDER — ONDANSETRON HCL 4 MG/2ML IJ SOLN
INTRAMUSCULAR | Status: DC | PRN
  Administered 2016-03-01: 4 mg via INTRAVENOUS

## 2016-03-01 MED ORDER — HEPARIN SODIUM (PORCINE) 5000 UNIT/ML IJ SOLN
INTRAMUSCULAR | Status: AC
  Filled 2016-03-01: qty 1

## 2016-03-01 MED ORDER — SODIUM CHLORIDE 0.9 % IJ SOLN
INTRAMUSCULAR | Status: DC | PRN
  Administered 2016-03-01: 50 mL

## 2016-03-01 MED ORDER — FENTANYL CITRATE (PF) 100 MCG/2ML IJ SOLN
25.0000 ug | INTRAMUSCULAR | Status: DC | PRN
  Administered 2016-03-01 (×2): 25 ug via INTRAVENOUS
  Administered 2016-03-01: 50 ug via INTRAVENOUS

## 2016-03-01 MED ORDER — ACETAMINOPHEN 325 MG PO TABS: 650.0000 mg | ORAL_TABLET | ORAL | Status: DC | PRN

## 2016-03-01 MED ORDER — PROPOFOL 10 MG/ML IV BOLUS
INTRAVENOUS | Status: AC
  Filled 2016-03-01: qty 20

## 2016-03-01 MED ORDER — SCOPOLAMINE 1 MG/3DAYS TD PT72
MEDICATED_PATCH | TRANSDERMAL | Status: DC | PRN
  Administered 2016-03-01: 1 via TRANSDERMAL

## 2016-03-01 MED ORDER — FENTANYL CITRATE (PF) 100 MCG/2ML IJ SOLN
INTRAMUSCULAR | Status: DC | PRN
  Administered 2016-03-01 (×2): 50 ug via INTRAVENOUS

## 2016-03-01 MED ORDER — DEXAMETHASONE SODIUM PHOSPHATE 10 MG/ML IJ SOLN
INTRAMUSCULAR | Status: DC | PRN
  Administered 2016-03-01: 10 mg via INTRAVENOUS

## 2016-03-01 MED ORDER — BUPIVACAINE LIPOSOME 1.3 % IJ SUSP
20.0000 mL | Freq: Once | INTRAMUSCULAR | Status: AC
  Administered 2016-03-01: 20 mL
  Filled 2016-03-01: qty 20

## 2016-03-01 MED ORDER — SCOPOLAMINE 1 MG/3DAYS TD PT72
MEDICATED_PATCH | TRANSDERMAL | Status: AC
  Filled 2016-03-01: qty 1

## 2016-03-01 MED ORDER — MORPHINE SULFATE (PF) 4 MG/ML IV SOLN
1.0000 mg | INTRAVENOUS | Status: DC | PRN
  Administered 2016-03-01 (×2): 2 mg via INTRAVENOUS
  Administered 2016-03-02 (×2): 3 mg via INTRAVENOUS
  Filled 2016-03-01 (×4): qty 1

## 2016-03-01 MED ORDER — MIDAZOLAM HCL 5 MG/5ML IJ SOLN
INTRAMUSCULAR | Status: DC | PRN
  Administered 2016-03-01: 2 mg via INTRAVENOUS

## 2016-03-01 MED ORDER — ACETAMINOPHEN 160 MG/5ML PO SOLN: 325.0000 mg | ORAL | Status: DC | PRN

## 2016-03-01 MED ORDER — SUCCINYLCHOLINE CHLORIDE 200 MG/10ML IV SOSY
PREFILLED_SYRINGE | INTRAVENOUS | Status: AC
  Filled 2016-03-01: qty 10

## 2016-03-01 MED ORDER — HEPARIN SODIUM (PORCINE) 5000 UNIT/ML IJ SOLN
5000.0000 [IU] | Freq: Once | INTRAMUSCULAR | Status: AC
  Administered 2016-03-01: 5000 [IU] via SUBCUTANEOUS
  Filled 2016-03-01: qty 1

## 2016-03-01 MED ORDER — OXYCODONE HCL 5 MG PO TABS: 5.0000 mg | ORAL_TABLET | Freq: Once | ORAL | Status: DC | PRN

## 2016-03-01 MED ORDER — OXYCODONE HCL 5 MG/5ML PO SOLN
5.0000 mg | ORAL | Status: DC | PRN
  Administered 2016-03-02 (×2): 5 mg via ORAL
  Filled 2016-03-01 (×2): qty 5

## 2016-03-01 MED ORDER — MORPHINE SULFATE (PF) 2 MG/ML IV SOLN
1.0000 mg | INTRAVENOUS | Status: DC | PRN
  Administered 2016-03-01: 2 mg via INTRAVENOUS
  Filled 2016-03-01: qty 1

## 2016-03-01 MED ORDER — PROPOFOL 10 MG/ML IV BOLUS
INTRAVENOUS | Status: DC | PRN
  Administered 2016-03-01: 200 mg via INTRAVENOUS

## 2016-03-01 MED ORDER — ROCURONIUM BROMIDE 10 MG/ML (PF) SYRINGE
PREFILLED_SYRINGE | INTRAVENOUS | Status: DC | PRN
  Administered 2016-03-01: 40 mg via INTRAVENOUS

## 2016-03-01 MED ORDER — STERILE WATER FOR IRRIGATION IR SOLN
Status: DC | PRN
  Administered 2016-03-01: 2000 mL

## 2016-03-01 MED ORDER — LACTATED RINGERS IR SOLN
Status: DC | PRN
  Administered 2016-03-01: 1000 mL

## 2016-03-01 MED ORDER — GABAPENTIN 250 MG/5ML PO SOLN
300.0000 mg | ORAL | Status: AC
  Administered 2016-03-01: 300 mg via ORAL
  Filled 2016-03-01: qty 6

## 2016-03-01 SURGICAL SUPPLY — 68 items
ADH SKN CLS APL DERMABOND .7 (GAUZE/BANDAGES/DRESSINGS) ×1
APPLICATOR COTTON TIP 6IN STRL (MISCELLANEOUS) IMPLANT
APPLIER CLIP ROT 10 11.4 M/L (STAPLE)
APPLIER CLIP ROT 13.4 12 LRG (CLIP)
APR CLP LRG 13.4X12 ROT 20 MLT (CLIP)
APR CLP MED LRG 11.4X10 (STAPLE)
BLADE SURG SZ11 CARB STEEL (BLADE) ×2 IMPLANT
CABLE HIGH FREQUENCY MONO STRZ (ELECTRODE) ×2 IMPLANT
CHLORAPREP W/TINT 26ML (MISCELLANEOUS) ×2 IMPLANT
CLIP APPLIE ROT 10 11.4 M/L (STAPLE) IMPLANT
CLIP APPLIE ROT 13.4 12 LRG (CLIP) IMPLANT
COVER SURGICAL LIGHT HANDLE (MISCELLANEOUS) ×2 IMPLANT
DECANTER SPIKE VIAL GLASS SM (MISCELLANEOUS) ×2 IMPLANT
DERMABOND ADVANCED (GAUZE/BANDAGES/DRESSINGS) ×1
DERMABOND ADVANCED .7 DNX12 (GAUZE/BANDAGES/DRESSINGS) ×1 IMPLANT
DEVICE SUT QUICK LOAD TK 5 (STAPLE) IMPLANT
DEVICE SUT TI-KNOT TK 5X26 (MISCELLANEOUS) IMPLANT
DEVICE SUTURE ENDOST 10MM (ENDOMECHANICALS) IMPLANT
DEVICE TROCAR PUNCTURE CLOSURE (ENDOMECHANICALS) IMPLANT
DRAPE UTILITY XL STRL (DRAPES) ×4 IMPLANT
ELECT L-HOOK LAP 45CM DISP (ELECTROSURGICAL)
ELECT PENCIL ROCKER SW 15FT (MISCELLANEOUS) ×2 IMPLANT
ELECT REM PT RETURN 9FT ADLT (ELECTROSURGICAL) ×2
ELECTRODE L-HOOK LAP 45CM DISP (ELECTROSURGICAL) IMPLANT
ELECTRODE REM PT RTRN 9FT ADLT (ELECTROSURGICAL) ×1 IMPLANT
GAUZE SPONGE 4X4 12PLY STRL (GAUZE/BANDAGES/DRESSINGS) IMPLANT
GLOVE BIO SURGEON STRL SZ7.5 (GLOVE) ×2 IMPLANT
GLOVE INDICATOR 8.0 STRL GRN (GLOVE) ×2 IMPLANT
GOWN STRL REUS W/TWL XL LVL3 (GOWN DISPOSABLE) ×6 IMPLANT
HOVERMATT SINGLE USE (MISCELLANEOUS) ×2 IMPLANT
IRRIG SUCT STRYKERFLOW 2 WTIP (MISCELLANEOUS) ×2
IRRIGATION SUCT STRKRFLW 2 WTP (MISCELLANEOUS) ×1 IMPLANT
KIT BASIN OR (CUSTOM PROCEDURE TRAY) ×2 IMPLANT
MARKER SKIN DUAL TIP RULER LAB (MISCELLANEOUS) ×2 IMPLANT
NEEDLE SPNL 22GX3.5 QUINCKE BK (NEEDLE) ×2 IMPLANT
PACK UNIVERSAL I (CUSTOM PROCEDURE TRAY) ×2 IMPLANT
RELOAD STAPLER BLUE 60MM (STAPLE) ×3 IMPLANT
RELOAD STAPLER GOLD 60MM (STAPLE) ×2 IMPLANT
RELOAD STAPLER GREEN 60MM (STAPLE) ×3 IMPLANT
SCISSORS LAP 5X45 EPIX DISP (ENDOMECHANICALS) ×2 IMPLANT
SEALANT SURGICAL APPL DUAL CAN (MISCELLANEOUS) IMPLANT
SHEARS HARMONIC ACE PLUS 45CM (MISCELLANEOUS) ×2 IMPLANT
SLEEVE ADV FIXATION 5X100MM (TROCAR) ×4 IMPLANT
SLEEVE GASTRECTOMY 40FR VISIGI (MISCELLANEOUS) ×2 IMPLANT
SOLUTION ANTI FOG 6CC (MISCELLANEOUS) ×2 IMPLANT
SPONGE LAP 18X18 X RAY DECT (DISPOSABLE) ×2 IMPLANT
STAPLER ECHELON BIOABSB 60 FLE (MISCELLANEOUS) ×12 IMPLANT
STAPLER ECHELON LONG 60 440 (INSTRUMENTS) ×2 IMPLANT
STAPLER RELOAD BLUE 60MM (STAPLE) ×6
STAPLER RELOAD GOLD 60MM (STAPLE) ×4
STAPLER RELOAD GREEN 60MM (STAPLE) ×6
SUT MNCRL AB 4-0 PS2 18 (SUTURE) ×2 IMPLANT
SUT SURGIDAC NAB ES-9 0 48 120 (SUTURE) IMPLANT
SUT VIC AB 2-0 SH 27 (SUTURE) ×2
SUT VIC AB 2-0 SH 27X BRD (SUTURE) ×1 IMPLANT
SUT VICRYL 0 TIES 12 18 (SUTURE) ×2 IMPLANT
SYR 10ML ECCENTRIC (SYRINGE) ×2 IMPLANT
SYR 20CC LL (SYRINGE) ×2 IMPLANT
SYR 50ML LL SCALE MARK (SYRINGE) ×2 IMPLANT
TOWEL OR 17X26 10 PK STRL BLUE (TOWEL DISPOSABLE) ×2 IMPLANT
TOWEL OR NON WOVEN STRL DISP B (DISPOSABLE) ×2 IMPLANT
TRAY FOLEY W/METER SILVER 16FR (SET/KITS/TRAYS/PACK) IMPLANT
TROCAR ADV FIXATION 5X100MM (TROCAR) ×2 IMPLANT
TROCAR BLADELESS 15MM (ENDOMECHANICALS) ×2 IMPLANT
TROCAR BLADELESS OPT 5 100 (ENDOMECHANICALS) ×2 IMPLANT
TUBING CONNECTING 10 (TUBING) ×2 IMPLANT
TUBING ENDO SMARTCAP (MISCELLANEOUS) ×2 IMPLANT
TUBING INSUF HEATED (TUBING) ×2 IMPLANT

## 2016-03-01 NOTE — Op Note (Signed)
Preoperative diagnosis: laparoscopic sleeve gastrectomy  Postoperative diagnosis: Same   Procedure: Upper endoscopy   Surgeon: Luke Kinsinger, M.D.  Anesthesia: Gen.   Indications for procedure: This patient was undergoing a laparoscopic sleeve gastrectomy.   Description of procedure: The endoscopy was placed in the mouth and into the oropharynx and under endoscopic vision it was advanced to the esophagogastric junction. The pouch was insufflated and no bleeding or bubbles were seen. The GEJ was identified at 39cm from the teeth. No bleeding or leaks were detected. The scope was withdrawn without difficulty.   Luke Kinsinger, M.D. General, Bariatric, & Minimally Invasive Surgery [facility]    

## 2016-03-01 NOTE — Transfer of Care (Signed)
Immediate Anesthesia Transfer of Care Note  Patient: Bethany Scott  Procedure(s) Performed: Procedure(s): LAPAROSCOPIC GASTRIC SLEEVE RESECTION, UPPER ENDO (N/A)  Patient Location: PACU  Anesthesia Type:General  Level of Consciousness: sedated  Airway & Oxygen Therapy: Patient Spontanous Breathing and Patient connected to face mask oxygen  Post-op Assessment: Report given to RN and Post -op Vital signs reviewed and stable  Post vital signs: Reviewed and stable  Last Vitals:  Vitals:   03/01/16 0913  BP: 140/82  Pulse: 97  Resp: 18  Temp: 36.8 C    Last Pain:  Vitals:   03/01/16 0913  TempSrc: Oral      Patients Stated Pain Goal: 4 ([id number])  Complications: No apparent anesthesia complications

## 2016-03-01 NOTE — Anesthesia Procedure Notes (Signed)
Procedure Name: Intubation Date/Time: 03/01/2016 10:18 AM Performed by: Lind Covert Pre-anesthesia Checklist: Patient identified, Timeout performed, Emergency Drugs available, Suction available and Patient being monitored Patient Re-evaluated:Patient Re-evaluated prior to inductionOxygen Delivery Method: Circle system utilized Preoxygenation: Pre-oxygenation with 100% oxygen Intubation Type: IV induction Laryngoscope Size: Mac and 4 Grade View: Grade I Tube size: 7.0 mm Number of attempts: 1 Airway Equipment and Method: Stylet Placement Confirmation: ETT inserted through vocal cords under direct vision,  positive ETCO2 and breath sounds checked- equal and bilateral Secured at: 22 cm Tube secured with: Tape Dental Injury: Teeth and Oropharynx as per pre-operative assessment

## 2016-03-01 NOTE — Anesthesia Postprocedure Evaluation (Signed)
Anesthesia Post Note  Patient: BURNELL HARTL  Procedure(s) Performed: Procedure(s) (LRB): LAPAROSCOPIC GASTRIC SLEEVE RESECTION, UPPER ENDO (N/A)  Patient location during evaluation: PACU Anesthesia Type: General Level of consciousness: awake, awake and alert and oriented Pain management: pain level controlled Vital Signs Assessment: post-procedure vital signs reviewed and stable Respiratory status: spontaneous breathing, nonlabored ventilation and respiratory function stable Cardiovascular status: blood pressure returned to baseline Anesthetic complications: no       Last Vitals:  Vitals:   03/01/16 1315 03/01/16 1330  BP: (!) 168/92 (!) 150/75  Pulse: 83 83  Resp: 17 16  Temp:  36.7 C    Last Pain:  Vitals:   03/01/16 1339  TempSrc:   PainSc: 7                  Zamoria Boss COKER

## 2016-03-01 NOTE — Anesthesia Preprocedure Evaluation (Signed)
Anesthesia Evaluation  Patient identified by MRN, date of birth, ID band Patient awake    Reviewed: Allergy & Precautions, NPO status , Patient's Chart, lab work & pertinent test results  Airway Mallampati: II  TM Distance: >3 FB Neck ROM: Full    Dental  (+) Teeth Intact, Dental Advisory Given   Pulmonary former smoker,    breath sounds clear to auscultation       Cardiovascular hypertension,  Rhythm:Regular     Neuro/Psych    GI/Hepatic   Endo/Other    Renal/GU      Musculoskeletal   Abdominal (+) + obese,   Peds  Hematology   Anesthesia Other Findings   Reproductive/Obstetrics                             Anesthesia Physical Anesthesia Plan  ASA: III  Anesthesia Plan: General   Post-op Pain Management:    Induction: Intravenous  Airway Management Planned: Oral ETT  Additional Equipment:   Intra-op Plan:   Post-operative Plan: Extubation in OR  Informed Consent: I have reviewed the patients History and Physical, chart, labs and discussed the procedure including the risks, benefits and alternatives for the proposed anesthesia with the patient or authorized representative who has indicated his/her understanding and acceptance.   Dental advisory given  Plan Discussed with: CRNA and Anesthesiologist  Anesthesia Plan Comments:         Anesthesia Quick Evaluation

## 2016-03-01 NOTE — H&P (Signed)
Bethany Scott is an 61 y.o. female.   Chief Complaint: here for surgery HPI: she comes in for laparoscopic sleeve gastrectomy with upper endoscopy. We had attempted a laparoscopic roux en y  Gastric bypass a few weeks ago but had to abort due to extensive small bowel adhesions. She has recovered. We then discussed sleeve gastrectomy as a alternate procedure extensively on the phone including the risk/benefits/recovery and long term implications of a sleeve gastrectomy. She elected to proceed with sleeve gastrectomy.  She denies any medical changes since she was last seen. She denies any trips the emergency room hospital. She was referred to cardiology and underwent a nuclear stress test which was found to be a low risk study. Her upper GI and chest x-ray right unremarkable. Hemoglobin A1c was 5.9. Other bariatric labs such as conference a metabolic panel, CBC and vitamin levels were within normal limits. Thiamine level was at low normal. She denies any chest pain, chest pressure, source of breath, orthopnea, dyspnea on exertion, prior blood clots, melena, hematochezia, abdominal pain, TIAs or amaurosis fugax  Past Medical History:  Diagnosis Date  . Allergy   . Anemia   . Anxiety   . Arthritis    OA Knee; non-specific autoimmune process followed by [facility]/Rheumatology.  . Asthma   . Blood transfusion without reported diagnosis YRS AGO  . Depression   . Gastric ulcer, unspecified as acute or chronic, without mention of hemorrhage, perforation, or obstruction YRS AGO  . GERD (gastroesophageal reflux disease)   . Hyperlipidemia   . Hypertension   . Migraine    hx of migraines  . Obesity, unspecified   . Other chronic cystitis   . Personal history of colonic polyps   . PONV (postoperative nausea and vomiting)    NAUSEA, SCOPOLAMINE PATCH HELPED WITH LAST SURGERIES  . Pre-diabetes     Past Surgical History:  Procedure Laterality Date  . ABDOMINAL HYSTERECTOMY  01/25/1994    uterine fibroids; DUB; cervical dysplasia; ovaries resected two years later.    . APPENDECTOMY    . BILATERAL OOPHORECTOMY  2001  . CESAREAN SECTION     x 2  . COLON SURGERY  YRS AGO   SIGMOIDECTOMY  . Colonoscopy  09/08/2012   diverticulosis, hemorrhoids.  [name].  Symptoms:  anemia, hemoccult +.  . ESOPHAGOGASTRODUODENOSCOPY  09/08/2012   +HH.  [name].  Symptoms: anemia, hemoccult +.  [name] LAPAROSCOPIC LYSIS OF ADHESIONS N/A 02/10/2016   Procedure: LAPAROSCOPIC LYSIS OF ADHESIONS;  Surgeon: [name], MD;  Location: [facility];  Service: General;  Laterality: N/A;  . LAPAROSCOPY N/A 02/10/2016   Procedure: LAPAROSCOPY DIAGNOSTIC;  Surgeon: [name], MD;  Location: [facility];  Service: General;  Laterality: N/A;  . NASAL SEPTUM SURGERY    . NECK SURGERY    . Rheumatology Consult  11/26/2011   multiple arthralgias. Autoimmune labs negative.  Rx for Plaquenil for non-specific autoimmune process.  [name].  [name] SIGMOID RESECTION / RECTOPEXY    . SPINE SURGERY    . TONSILLECTOMY AND ADENOIDECTOMY      Family History  Problem Relation Age of Onset  . Bipolar disorder Father   . Transient ischemic attack Father   . Dementia Father     secondary to head trauma  . Hypertension Father   . Diabetes Father   . CAD Father   . Cancer Mother 25    lung  . Migraines Mother   . Hypothyroidism Mother   . Hypertension Sister   .  Atrial fibrillation Sister   . Hypothyroidism Sister   . Alcohol abuse Brother   . Hypertension Brother   . Alcohol abuse Brother   . Atrial fibrillation Maternal Grandmother   . COPD Maternal Grandmother   . Diabetes Maternal Grandmother   . Hyperlipidemia Maternal Grandmother   . Diabetes Maternal Grandfather   . Diabetes Paternal Grandfather   . Heart disease Paternal Grandfather    Social History:  reports that she has quit smoking. She has a 5.00 pack-year smoking history. She has never used smokeless tobacco. She reports that she drinks alcohol. She  reports that she does not use drugs.  Allergies:  Allergies  Allergen Reactions  . Cephalosporins Anaphylaxis  . Penicillins Anaphylaxis    Has patient had a PCN reaction causing immediate rash, facial/tongue/throat swelling, SOB or lightheadedness with hypotension: Yes Has patient had a PCN reaction causing severe rash involving mucus membranes or skin necrosis: No Has patient had a PCN reaction that required hospitalization Yes Has patient had a PCN reaction occurring within the last 10 years: No If all of the above answers are "NO", then may proceed with Cephalosporin use.     Medications Prior to Admission  Medication Sig Dispense Refill  . atorvastatin (LIPITOR) 20 MG tablet Take 1 tablet (20 mg total) by mouth daily at 6 PM. 90 tablet 1  . Blood Glucose Monitoring Suppl KIT 1 each by Does not apply route as needed. Please dispense the monitor and supplies compliant with pt insurance ONETOUCH 100 each 11  . cetirizine (ZYRTEC) 10 MG tablet Take 10 mg by mouth daily.    . Cholecalciferol (VITAMIN D3) 5000 units CAPS Take 5,000 Units by mouth daily.    . DULoxetine (CYMBALTA) 60 MG capsule Take 60 mg by mouth 2 (two) times daily.    . hydrocortisone (ANUSOL-HC) 25 MG suppository PLACE 1 SUPPOSITORY (25 MG TOTAL) RECTALLY 2 (TWO) TIMES DAILY AS NEEDED FOR HEMORRHOIDS OR ITCHING. (Patient taking differently: Place 25 mg rectally 2 (two) times daily as needed for hemorrhoids. PLACE 1 SUPPOSITORY (25 MG TOTAL) RECTALLY 2 (TWO) TIMES DAILY AS NEEDED FOR HEMORRHOIDS OR ITCHING.) 12 suppository 0  . IRON PO Take 1 tablet by mouth 3 (three) times daily.    [name] lisinopril (PRINIVIL,ZESTRIL) 5 MG tablet Take 1 tablet (5 mg total) by mouth daily. 90 tablet 3  . LYSINE PO Take 1 tablet by mouth daily.    . montelukast (SINGULAIR) 10 MG tablet Take 1 tablet (10 mg total) by mouth daily. 90 tablet 3  . nitrofurantoin, macrocrystal-monohydrate, (MACROBID) 100 MG capsule TAKE 1 CAPSULE (100 MG TOTAL) BY  MOUTH 2 (TWO) TIMES DAILY AS NEEDED. (Patient taking differently: Take 100 mg by mouth 2 (two) times daily as needed (after intercourse). TAKE 1 CAPSULE (100 MG TOTAL) BY MOUTH 2 (TWO) TIMES DAILY AS NEEDED.) 30 capsule 2  . pantoprazole (PROTONIX) 40 MG tablet Take 1 tablet (40 mg total) by mouth 2 (two) times daily. 180 tablet 3  . phenazopyridine (PYRIDIUM) 100 MG tablet Take 1 tablet (100 mg total) by mouth 3 (three) times daily as needed for pain. 60 tablet 2  . promethazine (PHENERGAN) 25 MG tablet Take 1 tablet (25 mg total) by mouth every 8 (eight) hours as needed for nausea or vomiting. 40 tablet 0  . traMADol (ULTRAM) 50 MG tablet Limit 1 tab by mouth 2-4 times per day if tolerated (Patient taking differently: Take 50 mg by mouth every 6 (six) hours as needed for  moderate pain. ) 120 tablet 0  . beclomethasone (QVAR) 80 MCG/ACT inhaler Inhale 2 puffs into the lungs 2 (two) times daily. (Patient taking differently: Inhale 2 puffs into the lungs 2 (two) times daily as needed. ) 1 Inhaler 12  . diclofenac (FLECTOR) 1.3 % PTCH Apply one patch or a portion of patch to painful area of skin twice per day if tolerated (Patient taking differently: Place 1 patch onto the skin 2 (two) times daily as needed (pain). Apply one patch or a portion of patch to painful area of skin twice per day if tolerated) 60 patch 0  . HYDROcodone-acetaminophen (NORCO) 7.5-325 MG tablet Limit 1 tab by mouth per day or 3 - 6  times per day if tolerated (Patient taking differently: Take 1 tablet by mouth every 4 (four) hours as needed for moderate pain. ) 180 tablet 0  . rizatriptan (MAXALT) 10 MG tablet Take 10 mg by mouth as needed. May repeat in 2 hours if needed      No results found for this or any previous visit (from the past 48 hour(s)). No results found.  Review of Systems  Constitutional: Negative for weight loss.  HENT: Negative for nosebleeds.   Eyes: Negative for blurred vision.  Respiratory: Negative for  shortness of breath.   Cardiovascular: Negative for chest pain, palpitations, orthopnea and PND.       Denies DOE  Genitourinary: Negative for dysuria and hematuria.  Musculoskeletal: Negative.   Skin: Negative for itching and rash.  Neurological: Negative for dizziness, focal weakness, seizures, loss of consciousness and headaches.       Denies TIAs, amaurosis fugax  Endo/Heme/Allergies: Does not bruise/bleed easily.  Psychiatric/Behavioral: The patient is not nervous/anxious.     Blood pressure 140/82, pulse 97, temperature 98.2 F (36.8 C), temperature source Oral, resp. rate 18, height 5' 7"  (1.702 m), weight 101.2 kg (223 lb), SpO2 99 %. Physical Exam  Vitals reviewed. Constitutional: She is oriented to person, place, and time. She appears well-developed and well-nourished. No distress.  HENT:  Head: Normocephalic and atraumatic.  Right Ear: External ear normal.  Left Ear: External ear normal.  Eyes: Conjunctivae are normal. No scleral icterus.  Neck: Normal range of motion. Neck supple. No tracheal deviation present. No thyromegaly present.  Cardiovascular: Normal rate and normal heart sounds.   Respiratory: Effort normal and breath sounds normal. No stridor. No respiratory distress. She has no wheezes.  GI: There is no tenderness. There is no rebound and no guarding.  Healing abdominal trocar sites  Musculoskeletal: She exhibits no edema or tenderness.  Neurological: She is alert and oriented to person, place, and time. She exhibits normal muscle tone.  Skin: Skin is warm and dry. No rash noted. She is not diaphoretic. No erythema. No pallor.  Psychiatric: She has a normal mood and affect. Her behavior is normal. Judgment and thought content normal.     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    [name]. [name], MD, [place], Bariatric, & Minimally Invasive Surgery [facility], [place]   Gayland Curry, MD 03/01/2016, 9:55 AM

## 2016-03-01 NOTE — Op Note (Signed)
03/01/2016 Bethany Scott [birth year] [id number]   PRE-OPERATIVE DIAGNOSIS:     Obesity (BMI 35)   Essential hypertension, benign   Pure hypercholesterolemia   Diabetes mellitus type 2 in obese (HCC)   DJD (degenerative joint disease) of knee   Degenerative joint disease of sacroiliac joint   DDD (degenerative disc disease), lumbar   Degenerative joint disease (DJD) of hip   GERD (gastroesophageal reflux disease)   POST-OPERATIVE DIAGNOSIS:  same  PROCEDURE:  Procedure(s): LAPAROSCOPIC SLEEVE GASTRECTOMY UPPER GI ENDOSCOPY  SURGEON:  Surgeon(s): Gayland Curry, MD FACS FASMBS  ASSISTANTS: Gurney Maxin MD  ANESTHESIA:   general  DRAINS: none   BOUGIE: 55 fr ViSiGi  LOCAL MEDICATIONS USED:   Exparel  SPECIMEN:  Source of Specimen:  Greater curvature of stomach  DISPOSITION OF SPECIMEN:  PATHOLOGY  COUNTS:  YES  INDICATION FOR PROCEDURE: This is a very pleasant 61 y.o.-year-old morbidly obese female who has had unsuccessful attempts for sustained weight loss. The patient presents today for a planned laparoscopic sleeve gastrectomy with upper endoscopy. We had attempted a laparoscopic roux en y gastric bypass several weeks ago but the procedure had to be aborted due to numerous small bowel adhesions. We have discussed the risk and benefits of the procedure extensively preoperatively. Please see my separate notes.  PROCEDURE: After obtaining informed consent and receiving 5000 units of subcutaneous heparin, oral tylenol & gabapentin, the patient was brought to the operating room at [facility] and placed supine on the operating room table. General endotracheal anesthesia was established. Sequential compression devices were placed. A orogastric tube was placed. The patient's abdomen was prepped and draped in the usual standard surgical fashion. The patient received preoperative IV antibiotic. A surgical timeout was performed.  Access to the abdomen was achieved using a 5  mm 0 laparoscope thru a 5 mm trocar In the left upper Quadrant 2 fingerbreadths below the left subcostal margin using the Optiview technique thru her recent incision. Pneumoperitoneum was smoothly established up to 15 mm of mercury. The laparoscope was advanced and the abdominal cavity was surveilled. There were a few omental adhesions that had recurred below the umbilicus. The patient was then placed in reverse Trendelenburg. There was no evidence of a hiatal hernia on laparoscopy - gap in the left and right crus anteriorly.  A 5 mm trocar was placed slightly above and to the left of the umbilicus under direct visualization.  The [facility] liver retractor was placed under the left lobe of the liver through a 5 mm trocar incision site in the subxiphoid position. A 5 mm trocar was placed in the lateral right upper quadrant along with a 15 mm trocar in the mid right abdomen. A final 5 mm trocar was placed in the lateral LUQ.  All under direct visualization after exparel had been infiltrated in bilateral upper lateral abdominal walls as a tap block. We were able to use a lot of her recently made trocar incisions.   The stomach was inspected. It was completely decompressed and the orogastric tube was removed.  There was no anterior dimple that was obviously visible. The calibration tube was placed in the oropharynx and guided down into the stomach by the CRNA. 10 mL of air was insufflated into the calibration balloon. The calibration tubing was then gently pulled back by the CRNA and it did not slide past the GE junction. At this point the calibration tubing was desufflated and removed. This confirmed my suspicion of no clinically significant  hiatal hernia. Her preop UGI showed no hiatal hernia.  We identified the pylorus and measured 6 cm proximal to the pylorus and identified an area of where we would start taking down the short gastric vessels. Harmonic scalpel was used to take down the short gastric vessels  along the greater curvature of the stomach. We were able to enter the lesser sac. We continued to march along the greater curvature of the stomach taking down the short gastrics. As we approached the gastrosplenic ligament we took care in this area not to injure the spleen. We were able to take down the entire gastrosplenic ligament. We then mobilized the fundus away from the left crus of diaphragm. There were not any significant posterior gastric avascular attachments. This left the stomach completely mobilized. No vessels had been taken down along the lesser curvature of the stomach.  We then reidentified the pylorus. A 40Fr ViSiGi was then placed in the oropharynx and advanced down into the stomach and placed in the distal antrum and positioned along the lesser curvature. It was placed under suction which secured the 40Fr ViSiGi in place along the lesser curve. Then using the [employer] echelon 60 mm stapler with a green load with Seamguard, I placed a stapler along the antrum approximately 5 cm from the pylorus. The stapler was angled so that there is ample room at the angularis incisura. I then fired the first staple load after inspecting it posteriorly to ensure adequate space both anteriorly and posteriorly. At this point I still was not completely past the angularis so with another green load with Seamguard, I placed the stapler in position just inside the prior stapleline. We then rotated the stomach to insure that there was adequate anteriorly as well as posteriorly. The stapler was then fired. At this point I started using 60 mm gold load staple cartridge x1 with Seamguard. The echelon stapler was then repositioned with a 60 mm blue load with Seamguard and we continued to march up along the Lane. My assistant was holding traction along the greater curvature stomach along the cauterized short gastric vessels ensuring that the stomach was symmetrically retracted. Prior to each firing of the staple, we  rotated the stomach to ensure that there is adequate stomach left.  As we approached the fundus, I used 60 mm blue cartridge with Seamguard aiming slightly lateral to the esophageal fat pad. Although the staples on this fire had completely gone thru the last part of the stomach it had not completely cut it. Therefore 1 additional 60 blue load was used to free the remaining stomach. The sleeve was inspected. There is no evidence of cork screw. The staple line appeared hemostatic. The CRNA inflated the ViSiGi to the green zone and the upper abdomen was flooded with saline. There were no bubbles. The sleeve was decompressed and the ViSiGi removed. I did place a single interrupted 2-0 vicryl suture in a Lembert fashion along the distal greater curvature line where I started to use the harmonic scalpel.  My assistant scrubbed out and performed an upper endoscopy. The sleeve easily distended with air and the scope was easily advanced to the pylorus. There is no evidence of internal bleeding or cork screwing. There was no narrowing at the angularis. There is no evidence of bubbles. Please see his operative note for further details. The gastric sleeve was decompressed and the endoscope was removed.  The greater curvature the stomach was grasped with a laparoscopic grasper and removed from the 15 mm  trocar site.  The liver retractor was removed. I then closed the 15 mm trocar site with 1 interrupted 0 Vicryl sutures through the fascia using the endoclose. The closure was viewed laparoscopically and it was airtight. Remaining Exparel was then infiltrated in the preperitoneal spaces around the fascial closure site. Pneumoperitoneum was released. All trocar sites were closed with a 4-0 Monocryl in a subcuticular fashion followed by the application of Dermabond. The patient was extubated and taken to the recovery room in stable condition. All needle, instrument, and sponge counts were correct x2. There are no immediate  complications  (2) 60 mm green with Seamguard (1) 60 mm gold with seamguard (4) 60 mm blue with 3 seamguard  PLAN OF CARE: Admit to inpatient   PATIENT DISPOSITION:  PACU - hemodynamically stable.   Delay start of Pharmacological VTE agent (>24hrs) due to surgical blood loss or risk of bleeding:  no  [name]. [name], MD, FACS FASMBS General, Bariatric, & Minimally Invasive Surgery [facility], [place]

## 2016-03-02 LAB — COMPREHENSIVE METABOLIC PANEL
ALT: 40 U/L (ref 14–54)
AST: 33 U/L (ref 15–41)
Albumin: 4.1 g/dL (ref 3.5–5.0)
Alkaline Phosphatase: 72 U/L (ref 38–126)
Anion gap: 9 (ref 5–15)
BUN: 9 mg/dL (ref 6–20)
CO2: 26 mmol/L (ref 22–32)
CREATININE: 0.63 mg/dL (ref 0.44–1.00)
Calcium: 9.2 mg/dL (ref 8.9–10.3)
Chloride: 102 mmol/L (ref 101–111)
GFR calc non Af Amer: >60 mL/min (ref >60)
Glucose, Bld: 138 mg/dL — ABNORMAL HIGH (ref 65–99)
Potassium: 4.3 mmol/L (ref 3.5–5.1)
Sodium: 137 mmol/L (ref 135–145)
Total Bilirubin: 0.6 mg/dL (ref 0.3–1.2)
Total Protein: 7.3 g/dL (ref 6.5–8.1)

## 2016-03-02 LAB — CBC WITH DIFFERENTIAL/PLATELET
BASOS ABS: 0 10*3/uL (ref 0.0–0.1)
Basophils Relative: 0 %
EOS ABS: 0 10*3/uL (ref 0.0–0.7)
EOS PCT: 0 %
HCT: 38.6 % (ref 36.0–46.0)
Hemoglobin: 13 g/dL (ref 12.0–15.0)
LYMPHS ABS: 1 10*3/uL (ref 0.7–4.0)
Lymphocytes Relative: 11 %
MCH: 29.1 pg (ref 26.0–34.0)
MCHC: 33.7 g/dL (ref 30.0–36.0)
MCV: 86.5 fL (ref 78.0–100.0)
Monocytes Absolute: 0.4 10*3/uL (ref 0.1–1.0)
Monocytes Relative: 4 %
Neutro Abs: 7.2 10*3/uL (ref 1.7–7.7)
Neutrophils Relative %: 85 %
PLATELETS: 304 10*3/uL (ref 150–400)
RBC: 4.46 MIL/uL (ref 3.87–5.11)
RDW: 12.6 % (ref 11.5–15.5)
WBC: 8.5 10*3/uL (ref 4.0–10.5)

## 2016-03-02 LAB — GLUCOSE, CAPILLARY
GLUCOSE-CAPILLARY: 134 mg/dL — AB (ref 65–99)
GLUCOSE-CAPILLARY: 107 mg/dL — AB (ref 65–99)
Glucose-Capillary: 101 mg/dL — ABNORMAL HIGH (ref 65–99)
Glucose-Capillary: 86 mg/dL (ref 65–99)
Glucose-Capillary: 121 mg/dL — ABNORMAL HIGH (ref 65–99)

## 2016-03-02 LAB — HEMOGLOBIN AND HEMATOCRIT, BLOOD
HEMATOCRIT: 39.5 % (ref 36.0–46.0)
Hemoglobin: 13.1 g/dL (ref 12.0–15.0)

## 2016-03-02 MED ORDER — OXYCODONE HCL 5 MG/5ML PO SOLN: 5.0000 mg | ORAL | 0 refills | Status: DC | PRN

## 2016-03-02 MED ORDER — SODIUM CHLORIDE 0.45 % IV SOLN
INTRAVENOUS | Status: DC
  Administered 2016-03-02 – 2016-03-03 (×3): via INTRAVENOUS
  Filled 2016-03-02 (×7): qty 1000

## 2016-03-02 MED ORDER — METOCLOPRAMIDE HCL 5 MG/ML IJ SOLN
10.0000 mg | Freq: Four times a day (QID) | INTRAMUSCULAR | Status: DC | PRN
  Administered 2016-03-02 (×2): 10 mg via INTRAVENOUS
  Filled 2016-03-02 (×2): qty 2

## 2016-03-02 MED ORDER — ACETAMINOPHEN 325 MG PO TABS: 650.0000 mg | ORAL_TABLET | Freq: Four times a day (QID) | ORAL | Status: DC | PRN

## 2016-03-02 MED ORDER — LORATADINE 10 MG PO TABS
10.0000 mg | ORAL_TABLET | Freq: Every day | ORAL | Status: DC
  Administered 2016-03-02 – 2016-03-03 (×2): 10 mg via ORAL
  Filled 2016-03-02 (×2): qty 1

## 2016-03-02 MED ORDER — HYDROCODONE-ACETAMINOPHEN 7.5-325 MG/15ML PO SOLN
10.0000 mL | ORAL | Status: DC | PRN
  Administered 2016-03-03: 10 mL via ORAL
  Filled 2016-03-02: qty 15

## 2016-03-02 MED ORDER — LISINOPRIL 5 MG PO TABS
5.0000 mg | ORAL_TABLET | Freq: Every day | ORAL | Status: DC
  Administered 2016-03-02 – 2016-03-03 (×2): 5 mg via ORAL
  Filled 2016-03-02 (×2): qty 1

## 2016-03-02 NOTE — Discharge Instructions (Signed)

## 2016-03-02 NOTE — Progress Notes (Signed)
Patient ID: Bethany Scott, female   DOB: 11-25-1955, 61 y.o.   MRN: [id number]  Progress Note: Metabolic and Bariatric Surgery Service   Subjective: Doing well. Some nausea last night. Some upper abd pain. Did not like taste of liquid tylenol.   Objective: Vital signs in last 24 hours: Temp:  [97.5 F (36.4 C)-98.6 F (37 C)] 98.6 F (37 C) (02/06 0441) Pulse Rate:  [77-97] 82 (02/06 0441) Resp:  [12-20] 18 (02/06 0441) BP: (140-170)/(66-102) 141/74 (02/06 0441) SpO2:  [95 %-100 %] 97 % (02/06 0441) Weight:  [101.2 kg (223 lb)-103 kg (227 lb)] 101.2 kg (223 lb) (02/05 0916)    Intake/Output from previous day: 02/05 0701 - 02/06 0700 In: 3129.6 [P.O.:490; I.V.:2639.6] Out: 1700 [Urine:1700] Intake/Output this shift: No intake/output data recorded.  Lungs: cta  Cardiovascular: reg  Abd: soft, incisions c/d/i, approp mild TTP  Extremities: no edema, +SCDS  Neuro: alert, nonfocal, mae, pleasant  Lab Results: CBC   Recent Labs  03/01/16 1217 03/02/16 0455  WBC  --  8.5  HGB 13.6 13.0  HCT 40.3 38.6  PLT  --  304   BMET  Recent Labs  03/02/16 0455  NA 137  K 4.3  CL 102  CO2 26  GLUCOSE 138*  BUN 9  CREATININE 0.63  CALCIUM 9.2   PT/INR No results for input(s): LABPROT, INR in the last 72 hours. ABG No results for input(s): PHART, HCO3 in the last 72 hours.  Invalid input(s): PCO2, PO2  Studies/Results:  Anti-infectives: Anti-infectives    Start     Dose/Rate Route Frequency Ordered Stop   03/01/16 1000  levofloxacin (LEVAQUIN) IVPB 750 mg     750 mg 100 mL/hr over 90 Minutes Intravenous  Once 03/01/16 0947 03/01/16 1020      Medications: Scheduled Meds: . enoxaparin (LOVENOX) injection  30 mg Subcutaneous Q12H  . insulin aspart  0-15 Units Subcutaneous Q4H  . lisinopril  5 mg Oral Daily  . pantoprazole (PROTONIX) IV  40 mg Intravenous QHS  . [START ON 03/03/2016] protein supplement shake  2 oz Oral Q2H   Continuous Infusions: . 0.45 %  NaCl with KCl 20 mEq / L 125 mL/hr (03/01/16 1456)   PRN Meds:.acetaminophen, diphenhydrAMINE, morphine injection, ondansetron (ZOFRAN) IV, oxyCODONE **AND** [DISCONTINUED] acetaminophen, promethazine  Assessment/Plan: Patient Active Problem List   Diagnosis Date Noted  . GERD (gastroesophageal reflux disease) 03/01/2016  . Obesity (BMI 30-39.9) 03/01/2016  . 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 Hip arthritis 05/29/2015  . Degenerative joint disease (DJD) of hip 07/24/2014  . DJD (degenerative joint disease) of knee 06/17/2014  . Greater trochanteric bursitis of both hips 06/17/2014  . Degenerative joint disease of sacroiliac joint 06/17/2014  . Facet syndrome, lumbar 06/17/2014  . DDD (degenerative disc disease), lumbar 06/17/2014  . Sinusitis, acute, maxillary 02/08/2012  . Migraines 02/08/2012  . Depression with anxiety 02/08/2012  . Essential hypertension, benign 02/08/2012  . Pure hypercholesterolemia 02/08/2012  . Diabetes mellitus type 2 in obese ([place]) 02/08/2012   s/p Procedure(s): LAPAROSCOPIC GASTRIC SLEEVE RESECTION, UPPER ENDO 03/01/2016  Principal Problem:   Obesity (BMI 30-39.9) Active Problems:   Essential hypertension, benign   Pure hypercholesterolemia   Diabetes mellitus type 2 in obese (HCC)   DJD (degenerative joint disease) of knee   Degenerative joint disease of sacroiliac joint   DDD (degenerative disc disease), lumbar   Degenerative joint disease (DJD) of hip   GERD (gastroesophageal reflux disease)   S/P laparoscopic sleeve gastrectomy  Doing well. No fever. No tachycardia. Looks good. Did 2.5-3 cups of water last night.  Cont POD 1 diet; adv to protein shakes this am Cont dvt prophylaxis Change to pill form of tylenol Ambulate Restart home BP med Start dc teaching Discussed some dc instructions.    Disposition:  LOS: 1 day  The patient should be discharged from the hospital today  Gayland Curry, MD [phone] [facility]

## 2016-03-02 NOTE — Consult Note (Signed)
   [facility] CM Inpatient Consult   03/02/2016  Bethany Scott 1955/10/20 [id number]    Went to bedside to speak with Bethany Scott on behalf of [facility] Care Management program for [employer] employees/dependents with [facility] insurance. She endorses that she did want an Advance Directives packet. She declined visit from Chaplain to assist with filling one out. Provided Advance Directives packet along with [employer] brochure and contact information. She is agreeable to post hospital discharge call. Appreciative of visit.    Marthenia Rolling, MSN-Ed, RN,BSN [facility] Liaison [phone]

## 2016-03-02 NOTE — Progress Notes (Signed)
Patient alert and oriented, pain is controlled. Patient advanced to shakes this am, having post op nausea.  Nausea decreased with medication.  Reviewed Gastric sleeve discharge instructions with patient and patient is able to articulate understanding.  Provided information on BELT program, Support Group and [facility] outpatient pharmacy. All questions answered, will continue to monitor.  Omya Winfield RN

## 2016-03-02 NOTE — Plan of Care (Signed)
Problem: Food- and Nutrition-Related Knowledge Deficit (NB-1.1) Goal: Nutrition education Formal process to instruct or train a patient/client in a skill or to impart knowledge to help patients/clients voluntarily manage or modify food choices and eating behavior to maintain or improve health. Outcome: Completed/Met Date Met: 03/02/16 Nutrition Education Note  Received consult for diet education per DROP protocol.   Discussed 2 week post op diet with pt. Emphasized that liquids must be non carbonated, non caffeinated, and sugar free. Fluid goals discussed. Pt to follow up with outpatient bariatric RD for further diet progression after 2 weeks. Multivitamins and minerals also reviewed. Teach back method used, pt expressed understanding, expect good compliance.   Diet: First 2 Weeks  You will see the nutritionist about two (2) weeks after your surgery. The nutritionist will increase the types of foods you can eat if you are handling liquids well:  If you have severe vomiting or nausea and cannot handle clear liquids lasting longer than 1 day, call your surgeon  Protein Shake  Drink at least 2 ounces of shake 5-6 times per day  Each serving of protein shakes (usually 8 - 12 ounces) should have a minimum of:  15 grams of protein  And no more than 5 grams of carbohydrate  Goal for protein each day:  Men = 80 grams per day  Women = 60 grams per day  Protein powder may be added to fluids such as non-fat milk or Lactaid milk or Soy milk (limit to 35 grams added protein powder per serving)   Hydration  Slowly increase the amount of water and other clear liquids as tolerated (See Acceptable Fluids)  Slowly increase the amount of protein shake as tolerated  Sip fluids slowly and throughout the day  May use sugar substitutes in small amounts (no more than 6 - 8 packets per day; i.e. Splenda)   Fluid Goal  The first goal is to drink at least 8 ounces of protein shake/drink per day (or as directed  by the nutritionist); some examples of protein shakes are Premier Protein, [employer], [employer], EAS Edge HP, and Unjury. See handout from pre-op Bariatric Education Class:  Slowly increase the amount of protein shake you drink as tolerated  You may find it easier to slowly sip shakes throughout the day  It is important to get your proteins in first  Your fluid goal is to drink 64 - 100 ounces of fluid daily  It may take a few weeks to build up to this  32 oz (or more) should be clear liquids  And  32 oz (or more) should be full liquids (see below for examples)  Liquids should not contain sugar, caffeine, or carbonation   Clear Liquids:  Water or Sugar-free flavored water (i.e. Fruit H2O, Propel)  Decaffeinated coffee or tea (sugar-free)  Crystal Lite, Wyler's Lite, Minute Maid Lite  Sugar-free Jell-O  Bouillon or broth  Sugar-free Popsicle: *Less than 20 calories each; Limit 1 per day   Full Liquids:  Protein Shakes/Drinks + 2 choices per day of other full liquids  Full liquids must be:  No More Than 12 grams of Carbs per serving  No More Than 3 grams of Fat per serving  Strained low-fat cream soup  Non-Fat milk  Fat-free Lactaid Milk  Sugar-free yogurt (Dannon Lite & Fit, [place] yogurt, Oikos Zero)   Clayton Bibles, MS, RD, LDN Pager: [phone] After Hours Pager: [phone]

## 2016-03-02 NOTE — Progress Notes (Signed)
The patient does not meet criteria for discharge because she has persistant nausea and vomiting and is at high risk of dehydration.   Will keep tonight.  [name]. [name], MD, FACS General, Bariatric, & Minimally Invasive Surgery [facility], [place]

## 2016-03-03 LAB — CBC WITH DIFFERENTIAL/PLATELET
BASOS PCT: 0 %
Basophils Absolute: 0 10*3/uL (ref 0.0–0.1)
Eosinophils Absolute: 0 10*3/uL (ref 0.0–0.7)
Eosinophils Relative: 0 %
HEMATOCRIT: 40.8 % (ref 36.0–46.0)
Hemoglobin: 13.9 g/dL (ref 12.0–15.0)
LYMPHS PCT: 22 %
Lymphs Abs: 2.3 10*3/uL (ref 0.7–4.0)
MCH: 28.9 pg (ref 26.0–34.0)
MCHC: 34.1 g/dL (ref 30.0–36.0)
MCV: 84.8 fL (ref 78.0–100.0)
MONO ABS: 0.9 10*3/uL (ref 0.1–1.0)
MONOS PCT: 9 %
NEUTROS ABS: 7.1 10*3/uL (ref 1.7–7.7)
Neutrophils Relative %: 69 %
Platelets: 304 10*3/uL (ref 150–400)
RBC: 4.81 MIL/uL (ref 3.87–5.11)
RDW: 12.9 % (ref 11.5–15.5)
WBC: 10.3 10*3/uL (ref 4.0–10.5)

## 2016-03-03 LAB — GLUCOSE, CAPILLARY
Glucose-Capillary: 118 mg/dL — ABNORMAL HIGH (ref 65–99)
Glucose-Capillary: 119 mg/dL — ABNORMAL HIGH (ref 65–99)
Glucose-Capillary: 128 mg/dL — ABNORMAL HIGH (ref 65–99)
Glucose-Capillary: 148 mg/dL — ABNORMAL HIGH (ref 65–99)

## 2016-03-03 MED ORDER — HYDROCODONE-ACETAMINOPHEN 5-325 MG PO TABS: 1.0000 | ORAL_TABLET | Freq: Four times a day (QID) | ORAL | 0 refills | Status: DC | PRN

## 2016-03-03 NOTE — Progress Notes (Signed)
Patient alert and oriented, Post op day 2.  Provided support and encouragement.  Encouraged pulmonary toilet, ambulation and continued fluid intake. Patient currently working on protein shake states 90 ounces. All questions answered.  Will continue to monitor.

## 2016-03-03 NOTE — Progress Notes (Signed)
Patient ID: Bethany Scott, female   DOB: August 31, 1955, 61 y.o.   MRN: [id number]  Progress Note: Metabolic and Bariatric Surgery Service   Subjective: Feels more comfortable. Had several episodes of nausea/retching/small amount of emesis starting yesterday around 0800 so her dc was cancelled. Unclear if related to oral oxycodone. Had 1 episode of retching with foamies overnight  Objective: Vital signs in last 24 hours: Temp:  [98.2 F (36.8 C)-98.6 F (37 C)] 98.5 F (36.9 C) (02/07 0416) Pulse Rate:  [74-96] 88 (02/07 0416) Resp:  [16-18] 16 (02/07 0416) BP: (144-159)/(77-85) 157/80 (02/07 0416) SpO2:  [96 %-100 %] 96 % (02/07 0416) Weight:  [99.9 kg (220 lb 3.2 oz)] 99.9 kg (220 lb 3.2 oz) (02/07 0643) Last BM Date: 03/01/16  Intake/Output from previous day: 02/06 0701 - 02/07 0700 In: 3231.3 [I.V.:3231.3] Out: 4600 [Urine:4600] Intake/Output this shift: No intake/output data recorded.  Lungs: cta  Cardiovascular: rreg  Abd: soft, obese, incision c/d/i, some bruising around extraction site, mostly nontender  Extremities: no edema  Neuro: nonfocal, alert, not ill appearing  Lab Results: CBC   Recent Labs  03/02/16 0455 03/02/16 1603 03/03/16 0502  WBC 8.5  --  10.3  HGB 13.0 13.1 13.9  HCT 38.6 39.5 40.8  PLT 304  --  304   BMET  Recent Labs  03/02/16 0455  NA 137  K 4.3  CL 102  CO2 26  GLUCOSE 138*  BUN 9  CREATININE 0.63  CALCIUM 9.2   PT/INR No results for input(s): LABPROT, INR in the last 72 hours. ABG No results for input(s): PHART, HCO3 in the last 72 hours.  Invalid input(s): PCO2, PO2  Studies/Results:  Anti-infectives: Anti-infectives    Start     Dose/Rate Route Frequency Ordered Stop   03/01/16 1000  levofloxacin (LEVAQUIN) IVPB 750 mg     750 mg 100 mL/hr over 90 Minutes Intravenous  Once 03/01/16 0947 03/01/16 1020      Medications: Scheduled Meds: . enoxaparin (LOVENOX) injection  30 mg Subcutaneous Q12H  . insulin  aspart  0-15 Units Subcutaneous Q4H  . lisinopril  5 mg Oral Daily  . loratadine  10 mg Oral Daily  . pantoprazole (PROTONIX) IV  40 mg Intravenous QHS  . protein supplement shake  2 oz Oral Q2H   Continuous Infusions: . sodium chloride 0.45 % 1,000 mL with potassium chloride 20 mEq infusion 125 mL/hr at 03/03/16 0442   PRN Meds:.acetaminophen, diphenhydrAMINE, HYDROcodone-acetaminophen, metoCLOPramide (REGLAN) injection, morphine injection, ondansetron (ZOFRAN) IV, promethazine  Assessment/Plan: Patient Active Problem List   Diagnosis Date Noted  . GERD (gastroesophageal reflux disease) 03/01/2016  . Obesity (BMI 30-39.9) 03/01/2016  . 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 Hip arthritis 05/29/2015  . Degenerative joint disease (DJD) of hip 07/24/2014  . DJD (degenerative joint disease) of knee 06/17/2014  . Greater trochanteric bursitis of both hips 06/17/2014  . Degenerative joint disease of sacroiliac joint 06/17/2014  . Facet syndrome, lumbar 06/17/2014  . DDD (degenerative disc disease), lumbar 06/17/2014  . Sinusitis, acute, maxillary 02/08/2012  . Migraines 02/08/2012  . Depression with anxiety 02/08/2012  . Essential hypertension, benign 02/08/2012  . Pure hypercholesterolemia 02/08/2012  . Diabetes mellitus type 2 in obese ([place]) 02/08/2012   s/p Procedure(s): LAPAROSCOPIC GASTRIC SLEEVE RESECTION, UPPER ENDO 03/01/2016  Seems a little more comfortable today Cont water/shakes as tolerated We changed po pain med yesterday Will see how she does with oral intake this am to determine candidacy for dc Disposition:  LOS: 2 days  The patient will be in the hospital for normal postop protocol  Gayland Curry, MD [phone] [facility]

## 2016-03-04 ENCOUNTER — Encounter: Payer: Self-pay | Admitting: Family Medicine

## 2016-03-04 NOTE — Discharge Summary (Signed)
Physician Discharge Summary  JAINE ESTABROOKS FGB:[id number] DOB: 27-Jul-1955 DOA: 03/01/2016  PCP: [name], MD  Admit date: 03/01/2016 Discharge date: 03/03/2016  Recommendations for Outpatient Follow-up:  1.   Follow-up Information    Gayland Curry, MD Follow up on 03/23/2016.   Specialty:  General Surgery Why:  post op follow up @ 2:00pm  Contact information: [address] [place] [address] [place], [place] Follow up.   Specialty:  General Surgery Contact information: [place] [address] [phone]          Discharge Diagnoses:  Principal Problem:   Obesity (BMI 30-39.9) Active Problems:   Essential hypertension, benign   Pure hypercholesterolemia   Diabetes mellitus type 2 in obese (HCC)   DJD (degenerative joint disease) of knee   Degenerative joint disease of sacroiliac joint   DDD (degenerative disc disease), lumbar   Degenerative joint disease (DJD) of hip   GERD (gastroesophageal reflux disease)   S/P laparoscopic sleeve gastrectomy   Surgical Procedure: Laparoscopic Sleeve Gastrectomy, upper endoscopy  Discharge Condition: Good Disposition: Home  Diet recommendation: Postoperative sleeve gastrectomy diet (liquids only)  Filed Weights   03/01/16 0910 03/01/16 0916 03/03/16 0643  Weight: 103 kg (227 lb) 101.2 kg (223 lb) 99.9 kg (220 lb 3.2 oz)     Hospital Course:  The patient was admitted for a planned laparoscopic sleeve gastrectomy. Please see operative note. Preoperatively the patient was given 5000 units of subcutaneous heparin for DVT prophylaxis. Postoperative prophylactic Lovenox dosing was started on the morning of postoperative day 1. On the evening of postoperative day 0, the patient was started on water and ice chips. On postoperative day 1 the patient had no fever or tachycardia and was tolerating water in their diet was gradually advanced throughout the day. However she  developed nausea and vomiting after 0800 and therefore was not safe for discharge. On POD 2, The patient was ambulating without difficulty. Their vital signs are stable without fever or tachycardia. Their hemoglobin had remained stable. Her nausea/vomiting had resolved. We had changed around her oral narcotic which seemed to have helped.  The patient had received discharge instructions and counseling. They were deemed stable for discharge and had met discharge criteria   Discharge Instructions  Discharge Instructions    AMB Referral to [facility]    Complete by:  As directed    Please assign UMR member for post discharge call. Likely discharge soon. Currently at [facility]. Provided Advance Directives packet per request and [employer]. Please call with questions. Thanks .Marthenia Rolling, MSN-Ed, [facility] Liaison-[phone]   Reason for consult:  Please assign UMR member for post discharge call.   Expected date of contact:  1-3 days (reserved for hospital discharges)   Ambulate hourly while awake    Complete by:  As directed    Call MD for:  difficulty breathing, headache or visual disturbances    Complete by:  As directed    Call MD for:  persistant dizziness or light-headedness    Complete by:  As directed    Call MD for:  persistant nausea and vomiting    Complete by:  As directed    Call MD for:  redness, tenderness, or signs of infection (pain, swelling, redness, odor or green/yellow discharge around incision site)    Complete by:  As directed    Call MD for:  severe uncontrolled  pain    Complete by:  As directed    Call MD for:  temperature >101 F    Complete by:  As directed    Diet bariatric full liquid    Complete by:  As directed    Discharge instructions    Complete by:  As directed    See bariatric discharge instructions   Discharge patient    Complete by:  As directed    Discharge disposition:  01-Home or Self Care   Discharge  patient date:  03/03/2016   Incentive spirometry    Complete by:  As directed    Perform hourly while awake     Allergies as of 03/03/2016      Reactions   Cephalosporins Anaphylaxis   Penicillins Anaphylaxis   Has patient had a PCN reaction causing immediate rash, facial/tongue/throat swelling, SOB or lightheadedness with hypotension: Yes Has patient had a PCN reaction causing severe rash involving mucus membranes or skin necrosis: No Has patient had a PCN reaction that required hospitalization Yes Has patient had a PCN reaction occurring within the last 10 years: No If all of the above answers are "NO", then may proceed with Cephalosporin use.      Medication List    STOP taking these medications   HYDROcodone-acetaminophen 7.5-325 MG tablet Commonly known as:  NORCO Replaced by:  HYDROcodone-acetaminophen 5-325 MG tablet   traMADol 50 MG tablet Commonly known as:  ULTRAM     TAKE these medications   atorvastatin 20 MG tablet Commonly known as:  LIPITOR Take 1 tablet (20 mg total) by mouth daily at 6 PM.   beclomethasone 80 MCG/ACT inhaler Commonly known as:  QVAR Inhale 2 puffs into the lungs 2 (two) times daily. What changed:  when to take this  reasons to take this   Blood Glucose Monitoring Suppl Kit 1 each by Does not apply route as needed. Please dispense the monitor and supplies compliant with pt insurance ONETOUCH Notes to patient:  Monitor Blood Sugar Frequently and keep a log for primary care physician.   cetirizine 10 MG tablet Commonly known as:  ZYRTEC Take 10 mg by mouth daily.   diclofenac 1.3 % Ptch Commonly known as:  FLECTOR Apply one patch or a portion of patch to painful area of skin twice per day if tolerated What changed:  how much to take  how to take this  when to take this  reasons to take this  additional instructions   DULoxetine 60 MG capsule Commonly known as:  CYMBALTA Take 60 mg by mouth 2 (two) times daily.    HYDROcodone-acetaminophen 5-325 MG tablet Commonly known as:  NORCO/VICODIN Take 1 tablet by mouth every 6 (six) hours as needed for moderate pain. Replaces:  HYDROcodone-acetaminophen 7.5-325 MG tablet   hydrocortisone 25 MG suppository Commonly known as:  ANUSOL-HC PLACE 1 SUPPOSITORY (25 MG TOTAL) RECTALLY 2 (TWO) TIMES DAILY AS NEEDED FOR HEMORRHOIDS OR ITCHING. What changed:  how much to take  how to take this  when to take this  reasons to take this  additional instructions   IRON PO Take 1 tablet by mouth 3 (three) times daily.   lisinopril 5 MG tablet Commonly known as:  PRINIVIL,ZESTRIL Take 1 tablet (5 mg total) by mouth daily. Notes to patient:  Monitor Blood Pressure Daily and keep a log for primary care physician.  You may need to make changes to your medications with rapid weight loss.     LYSINE PO Take  1 tablet by mouth daily.   montelukast 10 MG tablet Commonly known as:  SINGULAIR Take 1 tablet (10 mg total) by mouth daily.   nitrofurantoin (macrocrystal-monohydrate) 100 MG capsule Commonly known as:  MACROBID TAKE 1 CAPSULE (100 MG TOTAL) BY MOUTH 2 (TWO) TIMES DAILY AS NEEDED. What changed:  how much to take  how to take this  when to take this  reasons to take this  additional instructions   pantoprazole 40 MG tablet Commonly known as:  PROTONIX Take 1 tablet (40 mg total) by mouth 2 (two) times daily.   phenazopyridine 100 MG tablet Commonly known as:  PYRIDIUM Take 1 tablet (100 mg total) by mouth 3 (three) times daily as needed for pain.   promethazine 25 MG tablet Commonly known as:  PHENERGAN Take 1 tablet (25 mg total) by mouth every 8 (eight) hours as needed for nausea or vomiting.   rizatriptan 10 MG tablet Commonly known as:  MAXALT Take 10 mg by mouth as needed. May repeat in 2 hours if needed   Vitamin D3 5000 units Caps Take 5,000 Units by mouth daily.      Follow-up Information    Gayland Curry, MD Follow up  on 03/23/2016.   Specialty:  General Surgery Why:  post op follow up @ 2:00pm  Contact information: [address] [place] [address] [place], [place] Follow up.   Specialty:  General Surgery Contact information: [place] [address] [phone]            The results of significant diagnostics from this hospitalization (including imaging, microbiology, ancillary and laboratory) are listed below for reference.    Significant Diagnostic Studies: No results found.  Labs: Basic Metabolic Panel:  Recent Labs Lab 02/27/16 1510 03/02/16 0455  NA 138 137  K 4.4 4.3  CL 103 102  CO2 28 26  GLUCOSE 93 138*  BUN 15 9  CREATININE 0.67 0.63  CALCIUM 9.7 9.2   Liver Function Tests:  Recent Labs Lab 02/27/16 1510 03/02/16 0455  AST 20 33  ALT 20 40  ALKPHOS 73 72  BILITOT 0.6 0.6  PROT 7.8 7.3  ALBUMIN 4.6 4.1    CBC:  Recent Labs Lab 02/27/16 1510 03/01/16 1217 03/02/16 0455 03/02/16 1603 03/03/16 0502  WBC 7.9  --  8.5  --  10.3  NEUTROABS  --   --  7.2  --  7.1  HGB 14.1 13.6 13.0 13.1 13.9  HCT 42.8 40.3 38.6 39.5 40.8  MCV 87.9  --  86.5  --  84.8  PLT 331  --  304  --  304    CBG:  Recent Labs Lab 03/02/16 2029 03/03/16 0008 03/03/16 0410 03/03/16 0756 03/03/16 1159  GLUCAP 121* 128* 148* 118* 119*    Principal Problem:   Obesity (BMI 30-39.9) Active Problems:   Essential hypertension, benign   Pure hypercholesterolemia   Diabetes mellitus type 2 in obese (HCC)   DJD (degenerative joint disease) of knee   Degenerative joint disease of sacroiliac joint   DDD (degenerative disc disease), lumbar   Degenerative joint disease (DJD) of hip   GERD (gastroesophageal reflux disease)   S/P laparoscopic sleeve gastrectomy   Time coordinating discharge: 15 min  Signed:  Gayland Curry, MD [facility], [place] [phone] 03/04/2016, 7:49 AM

## 2016-03-05 ENCOUNTER — Other Ambulatory Visit: Payer: Self-pay | Admitting: *Deleted

## 2016-03-05 ENCOUNTER — Other Ambulatory Visit: Payer: Self-pay

## 2016-03-05 MED ORDER — PROMETHAZINE HCL 25 MG PO TABS: 25.0000 mg | ORAL_TABLET | Freq: Three times a day (TID) | ORAL | 0 refills | Status: DC | PRN

## 2016-03-05 NOTE — Telephone Encounter (Signed)
promethezine refill per MyChart request Sent to [place].

## 2016-03-05 NOTE — Patient Outreach (Signed)
[facility]) Care Management  03/05/2016  Bethany Scott [birth year] UK:[id number]   Subjective: Telephone call to patient's home / mobile number, no answer, left HIPAA compliant voicemail message, and requested call back.   Objective: Per chart review, patient hospitalized 03/01/16 - 03/03/16 for obesity.  Status post LAPAROSCOPIC SLEEVE GASTRECTOMY UPPER GI ENDOSCOPY on 03/01/16.  Patient also has a history of diabetes and hypertension.    [facility] Care Management preop call completed on 02/25/16.     Assessment: Received UMR Transition of care referral on 03/02/16.   Transition of care follow up pending patient contact.    Plan: RNCM will call patient for 2nd telephone outreach attempt, transition of care follow up, within 10 business days, if no return call.    Tyriana Helmkamp H. Annia Friendly, BSN, [place] Management [facility] Telephonic CM Phone: [phone] Fax: [phone]

## 2016-03-08 ENCOUNTER — Other Ambulatory Visit: Payer: Self-pay | Admitting: *Deleted

## 2016-03-08 ENCOUNTER — Other Ambulatory Visit: Payer: Self-pay | Admitting: Pain Medicine

## 2016-03-08 DIAGNOSIS — M47817 Spondylosis without myelopathy or radiculopathy, lumbosacral region: Secondary | ICD-10-CM | POA: Diagnosis not present

## 2016-03-08 DIAGNOSIS — M169 Osteoarthritis of hip, unspecified: Secondary | ICD-10-CM | POA: Diagnosis not present

## 2016-03-08 DIAGNOSIS — M791 Myalgia: Secondary | ICD-10-CM | POA: Diagnosis not present

## 2016-03-08 DIAGNOSIS — M5416 Radiculopathy, lumbar region: Secondary | ICD-10-CM | POA: Diagnosis not present

## 2016-03-08 NOTE — Patient Outreach (Signed)
[facility]) Care Management  03/08/2016  Bethany Scott Jul 06, 1955 UK:[id number]   Subjective: Telephone call to patient's home / mobile number, no answer, left HIPAA compliant voicemail message, and requested call back.   Objective: Per chart review, patient hospitalized 03/01/16 - 03/03/16 for obesity.  Status post LAPAROSCOPIC SLEEVE GASTRECTOMY UPPER GI ENDOSCOPY on 03/01/16.  Patient also has a history of diabetes and hypertension.    [facility] Care Management preop call completed on 02/25/16.     Assessment: Received UMR Transition of care referral on 03/02/16. Transition of care follow up pending patient contact.    Plan: RNCM will call patient for 3rd telephone outreach attempt, transition of care follow up, within 10 business days, if no return call.    Mikhaela Zaugg H. Annia Friendly, BSN, [place] Telephonic CM Phone: [phone]

## 2016-03-09 ENCOUNTER — Encounter: Payer: Self-pay | Admitting: *Deleted

## 2016-03-09 ENCOUNTER — Other Ambulatory Visit: Payer: Self-pay | Admitting: *Deleted

## 2016-03-09 NOTE — Patient Outreach (Signed)
[facility]) Care Management  03/09/2016  Bethany Scott 10/24/1955 UK:[id number]   Subjective: Telephone call to patient's home / mobile number, no answer, left HIPAA compliant voicemail message, and requested call back.   Objective: Per chart review, patient hospitalized 03/01/16 - 03/03/16 for obesity. Status post LAPAROSCOPIC SLEEVE GASTRECTOMY UPPER GI ENDOSCOPY on 03/01/16. Patient also has a history of diabetes and hypertension. [facility] Care Management preop call completed on 02/25/16.   Assessment: Received UMR Transition of care referral on 03/02/16. Transition of care follow up pending patient contact.    Plan: RNCM will send patient unsuccessful outreach letter, [facility] pamphlet, and proceed with case closure within 10 business days, if no return call.     Bethany Scott H. Annia Friendly, BSN, [place] Management [facility] Telephonic CM Phone: [phone] Fax: [phone]

## 2016-03-10 NOTE — Telephone Encounter (Signed)
Left voicemail for patient including office number as contact. Await return call. Will follow up with patient 03/11/16 if no return call.

## 2016-03-11 NOTE — Telephone Encounter (Signed)
Voicemail left for patient for follow after discharge. Message left for patient along with office number to reach coordinator office.

## 2016-03-16 ENCOUNTER — Encounter: Payer: 59 | Attending: General Surgery | Admitting: Skilled Nursing Facility1

## 2016-03-16 DIAGNOSIS — E669 Obesity, unspecified: Secondary | ICD-10-CM

## 2016-03-16 DIAGNOSIS — Z713 Dietary counseling and surveillance: Secondary | ICD-10-CM | POA: Diagnosis not present

## 2016-03-16 DIAGNOSIS — E1169 Type 2 diabetes mellitus with other specified complication: Secondary | ICD-10-CM

## 2016-03-17 ENCOUNTER — Encounter: Payer: Self-pay | Admitting: Skilled Nursing Facility1

## 2016-03-17 NOTE — Progress Notes (Addendum)
  Bariatric Class:  Appt start time: 1530 end time:  1630.  2 Week Post-Operative Nutrition Class  Patient was seen on 03/16/2016 for [facility]ative [facility].   Likes to be called Bethany Scott  Waist circumference: 44.71  The following the learning objectives were met by the patient during this course:  Identifies Phase 3A (Soft, High Proteins) Dietary Goals and will begin from 2 weeks post-operatively to 2 months post-operatively  Identifies appropriate sources of fluids and proteins   States protein recommendations and appropriate sources post-operatively  Identifies the need for appropriate texture modifications, mastication, and bite sizes when consuming solids  Identifies appropriate multivitamin and calcium sources post-operatively  Describes the need for physical activity post-operatively and will follow MD recommendations  States when to call healthcare provider regarding medication questions or post-operative complications  Handouts given during class include:  Phase 3A: Soft, High Protein Diet Handout  Follow-Up Plan: Patient will follow-up at [facility] in 6 weeks for 2 month post-op nutrition visit for diet advancement per MD.  Surgery date: 03/01/2016 Surgery type: Sleeve Gastrectomy  Start weight at [facility]: 234.1 pounds Weight today: 211.4 pounds   TANITA  BODY COMP RESULTS  03/16/2016   BMI (kg/m^2) 33.1   Fat Mass (lbs) 103.4   Fat Free Mass (lbs) 108   Total Body Water (lbs) 77.6

## 2016-03-23 ENCOUNTER — Other Ambulatory Visit: Payer: Self-pay | Admitting: *Deleted

## 2016-03-23 NOTE — Patient Outreach (Signed)
[facility]) Care Management  03/23/2016  Bethany Scott [birth year] UK:[id number]   No response to patient outreach attempts, will proceed with case closure.   Objective: Per chart review, patient hospitalized 03/01/16 - 03/03/16 for obesity. Status post LAPAROSCOPIC SLEEVE GASTRECTOMY UPPER GI ENDOSCOPY on 03/01/16. Patient also has a history of diabetes and hypertension. [facility] Care Management preop call completed on 02/25/16.   Assessment: Received UMR Transition of care referral on 03/02/16. Transition of care follow up not completed due to patient unable to reach, and will proceed with case closure.    Plan: RNCM will send case closure due to unable to reach request to [name] [place] Management.     Bethany Scott H. Annia Friendly, BSN, [place] Management [facility] Telephonic CM Phone: [phone] Fax: [phone]

## 2016-03-30 ENCOUNTER — Telehealth: Payer: Self-pay | Admitting: Family Medicine

## 2016-03-30 NOTE — Telephone Encounter (Signed)
MyChart message sent to patient about having to change her appointment on 04/27/16 at 530pm

## 2016-04-05 ENCOUNTER — Encounter: Payer: Self-pay | Admitting: Family Medicine

## 2016-04-05 DIAGNOSIS — M169 Osteoarthritis of hip, unspecified: Secondary | ICD-10-CM | POA: Diagnosis not present

## 2016-04-05 DIAGNOSIS — Z79891 Long term (current) use of opiate analgesic: Secondary | ICD-10-CM | POA: Diagnosis not present

## 2016-04-05 DIAGNOSIS — M791 Myalgia: Secondary | ICD-10-CM | POA: Diagnosis not present

## 2016-04-05 DIAGNOSIS — G894 Chronic pain syndrome: Secondary | ICD-10-CM | POA: Diagnosis not present

## 2016-04-05 DIAGNOSIS — M5416 Radiculopathy, lumbar region: Secondary | ICD-10-CM | POA: Diagnosis not present

## 2016-04-05 DIAGNOSIS — M47817 Spondylosis without myelopathy or radiculopathy, lumbosacral region: Secondary | ICD-10-CM | POA: Diagnosis not present

## 2016-04-06 MED ORDER — FLUCONAZOLE 150 MG PO TABS: 150.0000 mg | ORAL_TABLET | Freq: Once | ORAL | 0 refills | Status: AC

## 2016-04-20 ENCOUNTER — Encounter: Payer: Self-pay | Admitting: Family Medicine

## 2016-04-22 MED ORDER — FLUCONAZOLE 150 MG PO TABS: 150.0000 mg | ORAL_TABLET | Freq: Once | ORAL | 0 refills | Status: AC

## 2016-04-26 NOTE — Telephone Encounter (Signed)
Noted and agree with recommendations per [facility].  No further action.

## 2016-04-27 ENCOUNTER — Ambulatory Visit (INDEPENDENT_AMBULATORY_CARE_PROVIDER_SITE_OTHER): Payer: 59 | Admitting: Family Medicine

## 2016-04-27 ENCOUNTER — Encounter: Payer: Self-pay | Admitting: Family Medicine

## 2016-04-27 ENCOUNTER — Encounter (HOSPITAL_COMMUNITY): Payer: Self-pay | Admitting: *Deleted

## 2016-04-27 VITALS — BP 111/70 | HR 74 | Temp 98.7°F | Resp 16 | Ht 67.0 in | Wt 202.8 lb

## 2016-04-27 DIAGNOSIS — Z9884 Bariatric surgery status: Secondary | ICD-10-CM

## 2016-04-27 DIAGNOSIS — E7800 Pure hypercholesterolemia, unspecified: Secondary | ICD-10-CM | POA: Diagnosis not present

## 2016-04-27 DIAGNOSIS — I10 Essential (primary) hypertension: Secondary | ICD-10-CM | POA: Diagnosis not present

## 2016-04-27 DIAGNOSIS — J029 Acute pharyngitis, unspecified: Secondary | ICD-10-CM | POA: Diagnosis not present

## 2016-04-27 DIAGNOSIS — J0101 Acute recurrent maxillary sinusitis: Secondary | ICD-10-CM | POA: Diagnosis not present

## 2016-04-27 DIAGNOSIS — F418 Other specified anxiety disorders: Secondary | ICD-10-CM

## 2016-04-27 DIAGNOSIS — E669 Obesity, unspecified: Secondary | ICD-10-CM

## 2016-04-27 LAB — POCT URINALYSIS DIP (MANUAL ENTRY)
BILIRUBIN UA: NEGATIVE
Bilirubin, UA: NEGATIVE
Glucose, UA: NEGATIVE
Nitrite, UA: NEGATIVE
PROTEIN UA: NEGATIVE
Spec Grav, UA: 1.005 (ref 1.030–1.035)
UROBILINOGEN UA: 0.2 (ref ?–2.0)
pH, UA: 6 (ref 5.0–8.0)

## 2016-04-27 LAB — POCT RAPID STREP A (OFFICE): RAPID STREP A SCREEN: NEGATIVE

## 2016-04-27 MED ORDER — LEVOFLOXACIN 750 MG PO TABS: 750.0000 mg | ORAL_TABLET | Freq: Every day | ORAL | 0 refills | Status: DC

## 2016-04-27 MED ORDER — ATORVASTATIN CALCIUM 20 MG PO TABS: 20.0000 mg | ORAL_TABLET | Freq: Every day | ORAL | 1 refills | Status: DC

## 2016-04-27 MED ORDER — BECLOMETHASONE DIPROPIONATE 80 MCG/ACT IN AERS: 2.0000 | INHALATION_SPRAY | Freq: Two times a day (BID) | RESPIRATORY_TRACT | 12 refills | Status: DC

## 2016-04-27 NOTE — Progress Notes (Signed)
Subjective:    Patient ID: Bethany Scott, female    DOB: [birth year], 61 y.o.   MRN: [id number]  04/27/2016  Follow-up (6 month follow up )   HPI This 61 y.o. female presents for six month follow-up of hypertension, glucose intolerance, hypercholesterolemia, migraines, DDD lumbar spine, obesity.  Went in on February 10, 2016; had discussed previous surgeries including C-sections.  Never had discussion of adhesions.  Originally planned for gastric bypass; unable to complete due to adhesions.  Unable to complete surgery.  Recommended gastric sleeve.  Did not recommend gastric bypass due to severe adhesions.  Also had another surgeon come in during surgery for second opinion.  So disappointed.  Weight at 10/2015 visit 238.  Pt was determined to undergo gastric sleeve.  Husband fought pt's decision to undergo gastric sleeve.  Met first goal of reaching less than 200 lb.  Has been so constipated; has tried on Linzess which was helpful for the first day but then returned to constipation.  Doubled Linzess; thinks had a impaction.  Rectally disimpacted self last week.  Had taken an enema, 2 suppositories, and then a laxative without any results.  Up all night long; finally got everything out.  Has gotten horribly constipated. Still on protein right now; sees nutritionist tomorrow.  Only on protein for six weeks.  Eating greek yogurt, 2% cheese, salmon, chicken breast.  Has done great; hsa been very strict with diet.  Drinking shakes.  Taking 3 Colace daily since last week.  Drinking ton of water; shocked by amount of water you are able to drink. Drinking way over 64 ounces of water.  Can go without food but cannot go without fluid.  Drinks all day water; no diet pepsi since before surgery; no caffeine.  Energy level pretty good.  No exercise yet.  Dr. Rodena Medin behind [address].  Counseled regarding bariatric surgery.  Counseling regarding weight loss and emotional eating.  Specializing in the  surgery.   Immunization History  Administered Date(s) Administered  . Hepatitis B, adult 01/02/2013, 04/04/2013, 09/17/2013  . Influenza Split 11/05/2011, 10/25/2012  . Influenza,inj,Quad PF,36+ Mos 10/28/2015  . Influenza-Unspecified 11/25/2013, 11/26/2014  . Pneumococcal Polysaccharide-23 07/07/2010  . Tdap 11/23/2006   BP Readings from Last 3 Encounters:  04/27/16 111/70  03/03/16 (!) 157/80  02/27/16 118/62   Wt Readings from Last 3 Encounters:  04/27/16 202 lb 12.8 oz (92 kg)  03/17/16 211 lb 6.4 oz (95.9 kg)  03/03/16 220 lb 3.2 oz (99.9 kg)    Review of Systems  Constitutional: Negative for chills, diaphoresis, fatigue and fever.  HENT: Positive for congestion, postnasal drip, rhinorrhea, sinus pain, sinus pressure, sneezing, sore throat and voice change. Negative for ear pain.   Eyes: Negative for visual disturbance.  Respiratory: Negative for cough and shortness of breath.   Cardiovascular: Negative for chest pain, palpitations and leg swelling.  Gastrointestinal: Negative for abdominal pain, constipation, diarrhea, nausea and vomiting.  Endocrine: Negative for cold intolerance, heat intolerance, polydipsia, polyphagia and polyuria.  Neurological: Negative for dizziness, tremors, seizures, syncope, facial asymmetry, speech difficulty, weakness, light-headedness, numbness and headaches.  Psychiatric/Behavioral: Positive for dysphoric mood. Negative for self-injury, sleep disturbance and suicidal ideas. The patient is nervous/anxious.     Past Medical History:  Diagnosis Date  . Allergy   . Anemia   . Anxiety   . Arthritis    OA Knee; non-specific autoimmune process followed by [facility]he[facility]ology.  . Asthma   . Blood transfusion without reported diagnosis YRS  AGO  . Depression   . Gastric ulcer, unspecified as acute or chronic, without mention of hemorrhage, perforation, or obstruction YRS AGO  . GERD (gastroesophageal reflux disease)   . Hyperlipidemia    . Hypertension   . Migraine    hx of migraines  . Obesity, unspecified   . Other chronic cystitis   . Personal history of colonic polyps   . PONV (postoperative nausea and vomiting)    NAUSEA, SCOPOLAMINE PATCH HELPED WITH LAST SURGERIES  . Pre-diabetes    Past Surgical History:  Procedure Laterality Date  . ABDOMINAL HYSTERECTOMY  01/25/1994   uterine fibroids; DUB; cervical dysplasia; ovaries resected two years later.    . APPENDECTOMY    . BILATERAL OOPHORECTOMY  2001  . CESAREAN SECTION     x 2  . COLON SURGERY  YRS AGO   SIGMOIDECTOMY  . Colonoscopy  09/08/2012   diverticulosis, hemorrhoids.  [name].  Symptoms:  anemia, hemoccult +.  . ESOPHAGOGASTRODUODENOSCOPY  09/08/2012   +HH.  [name].  Symptoms: anemia, hemoccult +.  [name] LAPAROSCOPIC GASTRIC SLEEVE RESECTION N/A 03/01/2016   Procedure: LAPAROSCOPIC GASTRIC SLEEVE RESECTION, UPPER ENDO;  Surgeon: [name], MD;  Location: [facility];  Service: General;  Laterality: N/A;  . LAPAROSCOPIC LYSIS OF ADHESIONS N/A 02/10/2016   Procedure: LAPAROSCOPIC LYSIS OF ADHESIONS;  Surgeon: [name], MD;  Location: [facility];  Service: General;  Laterality: N/A;  . LAPAROSCOPY N/A 02/10/2016   Procedure: LAPAROSCOPY DIAGNOSTIC;  Surgeon: [name], MD;  Location: [facility];  Service: General;  Laterality: N/A;  . NASAL SEPTUM SURGERY    . NECK SURGERY    . Rheumatology Consult  11/26/2011   multiple arthralgias. Autoimmune labs negative.  Rx for Plaquenil for non-specific autoimmune process.  [name].  [name] SIGMOID RESECTION / RECTOPEXY    . SPINE SURGERY    . TONSILLECTOMY AND ADENOIDECTOMY     Allergies  Allergen Reactions  . Cephalosporins Anaphylaxis  . Penicillins Anaphylaxis    Has patient had a PCN reaction causing immediate rash, facial/tongue/throat swelling, SOB or lightheadedness with hypotension: Yes Has patient had a PCN reaction causing severe rash involving mucus membranes or skin necrosis: No Has patient had a PCN  reaction that required hospitalization Yes Has patient had a PCN reaction occurring within the last 10 years: No If all of the above answers are "NO", then may proceed with Cephalosporin use.     Social History   Social History  . Marital status: Married    Spouse name: N/A  . Number of children: 2  . Years of education: 12   Occupational History  . 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 Smoking status: Former Smoker    Packs/day: 1.00    Years: 5.00  . Smokeless tobacco: Never Used     Comment: Quit 26 years ago  . Alcohol use Yes     Comment: rare  . Drug use: No  . Sexual activity: Yes    Birth control/ protection: Post-menopausal, Surgical   Other Topics Concern  . Not on file   Social History Narrative   Marital status: married since 10 years; happily married; no abuse.      Children: 2 sons (22, 34); 2 grandchildren  (53yo, 55 months)      Lives: with husband, [name]/son.      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  Tobacco abuse:  Former smoker.      Alcohol:  None; holidays only      Exercise: none in 2017     Seatbelt: 100%; no texting   Family History  Problem Relation Age of Onset  . Bipolar disorder Father   . Transient ischemic attack Father   . Dementia Father     secondary to head trauma  . Hypertension Father   . Diabetes Father   . CAD Father   . Cancer Mother 4    lung  . Migraines Mother   . Hypothyroidism Mother   . Hypertension Sister   . Atrial fibrillation Sister   . Hypothyroidism Sister   . Alcohol abuse Brother   . Hypertension Brother   . Alcohol abuse Brother   . Atrial fibrillation Maternal Grandmother   . COPD Maternal Grandmother   . Diabetes Maternal Grandmother   . Hyperlipidemia Maternal Grandmother   . Diabetes Maternal Grandfather   . Diabetes Paternal Grandfather   . Heart disease Paternal Grandfather         Objective:    BP 111/70 (BP Location: Right Arm, Patient Position: Sitting, Cuff Size: Small)   Pulse 74   Temp 98.7 F (37.1 C) (Oral)   Resp 16   Ht 5' 7" (1.702 m)   Wt 202 lb 12.8 oz (92 kg)   SpO2 99%   BMI 31.76 kg/m  Physical Exam  Constitutional: She is oriented to person, place, and time. She appears well-developed and well-nourished. No distress.  HENT:  Head: Normocephalic and atraumatic.  Right Ear: Tympanic membrane, external ear and ear canal normal.  Left Ear: Tympanic membrane, external ear and ear canal normal.  Nose: Mucosal edema and rhinorrhea present. Right sinus exhibits maxillary sinus tenderness and frontal sinus tenderness. Left sinus exhibits maxillary sinus tenderness. Left sinus exhibits no frontal sinus tenderness.  Mouth/Throat: Uvula is midline, oropharynx is clear and moist and mucous membranes are normal.  Eyes: Conjunctivae and EOM are normal. Pupils are equal, round, and reactive to light.  Neck: Normal range of motion. Neck supple. Carotid bruit is not present. No thyromegaly present.  Cardiovascular: Normal rate, regular rhythm, normal heart sounds and intact distal pulses.  Exam reveals no gallop and no friction rub.   No murmur heard. Pulmonary/Chest: Effort normal and breath sounds normal. She has no wheezes. She has no rales.  Abdominal: Soft. Bowel sounds are normal. She exhibits no distension and no mass. There is no tenderness. There is no rebound and no guarding.  Lymphadenopathy:    She has no cervical adenopathy.  Neurological: She is alert and oriented to person, place, and time. No cranial nerve deficit.  Skin: Skin is warm and dry. No rash noted. She is not diaphoretic. No erythema. No pallor.  Psychiatric: She has a normal mood and affect. Her behavior is normal.   Results for orders placed or performed in visit on 04/27/16  POCT rapid strep A  Result Value Ref Range   Rapid Strep A Screen Negative Negative   Depression  screen [facility] 2/9 04/27/2016 02/04/2016 10/28/2015 10/06/2015 09/10/2015  Decreased Interest 0 0 0 0 0  Down, Depressed, Hopeless 0 0 0 0 0  PHQ - 2 Score 0 0 0 0 0   Fall Risk  04/27/2016 02/04/2016 10/28/2015 10/06/2015 09/10/2015  Falls in the past year? _0   Number falls in past yr: - - - - -  Injury with Fall? - - - - -  Risk for fall due to : - - - - -  Follow up - - - - -       Assessment & Plan:   1. Essential hypertension, benign   2. Acute recurrent maxillary sinusitis   3. S/P laparoscopic sleeve gastrectomy   4. Pure hypercholesterolemia   5. Depression with anxiety   6. Obesity (BMI 30-39.9)   7. Sore throat    -acute illness; rapid strep negative; send throat cx; treat with Levaquin; continue allergy medications. -has lost 36 pounds since gastric sleeve.  Doing well; coping well with dietary changes.   -obtain labs. -no changes to medications at this time. -ongoing family stressors yet coping well.  Counseling provided.   Orders Placed This Encounter  Procedures  . Culture, Group A Strep    Order Specific Question:   Source    Answer:   oropharynx  . CBC with Differential/Platelet  . Comprehensive metabolic panel    Order Specific Question:   Has the patient fasted?    Answer:   Yes  . Lipid panel    Order Specific Question:   Has the patient fasted?    Answer:   Yes  . Vitamin B12  . VITAMIN D 25 Hydroxy (Vit-D Deficiency, Fractures)  . Iron and TIBC  . POCT urinalysis dipstick  . POCT rapid strep A   Meds ordered this encounter  Medications  . levofloxacin (LEVAQUIN) 750 MG tablet    Sig: Take 1 tablet (750 mg total) by mouth daily.    Dispense:  10 tablet    Refill:  0  . beclomethasone (QVAR) 80 MCG/ACT inhaler    Sig: Inhale 2 puffs into the lungs 2 (two) times daily.    Dispense:  1 Inhaler    Refill:  12  . atorvastatin (LIPITOR) 20 MG tablet    Sig: Take 1 tablet (20 mg total) by mouth daily at 6 PM.    Dispense:  90 tablet    Refill:  1     Return in about 6 months (around 10/27/2016) for complete physical examiniation.    Elayne Guerin, M.D. Primary Care at [facility] previously Ur[facility] [place] [address] [place], [place]  [address] [phone] phone [phone] fax

## 2016-04-27 NOTE — Patient Instructions (Addendum)
   CHECK BLOOD PRESSURE ONCE WEEKLY; LET ME KNOW IF BLOOD PRESSURE LESS THAN 110/60.     We recommend that you schedule a mammogram for breast cancer screening. Typically, you do not need a referral to do this. Please contact a local imaging center to schedule your mammogram.  [facility] - [phone]  *ask for the Radiology Department [facility] - [phone] or [phone]  [facility] - [phone] [place] [phone] [facility] - [phone]  *ask for the [facility] - [phone]  *ask for the Radiology Department [facility] - [phone]  *ask for the [facility] - [phone]    IF you received an x-ray today, you will receive an invoice from [facility]. Please contact [facility] at [phone] with questions or concerns regarding your invoice.   IF you received labwork today, you will receive an invoice from [facility]. Please contact [facility] at [phone] with questions or concerns regarding your invoice.   Our billing staff will not be able to assist you with questions regarding bills from these companies.  You will be contacted with the lab results as soon as they are available. The fastest way to get your results is to activate your My Chart account. Instructions are located on the last page of this paperwork. If you have not heard from [place] regarding the results in 2 weeks, please contact this office.

## 2016-04-28 ENCOUNTER — Encounter: Payer: Self-pay | Admitting: Skilled Nursing Facility1

## 2016-04-28 ENCOUNTER — Encounter: Payer: 59 | Attending: General Surgery | Admitting: Skilled Nursing Facility1

## 2016-04-28 DIAGNOSIS — Z713 Dietary counseling and surveillance: Secondary | ICD-10-CM | POA: Insufficient documentation

## 2016-04-28 DIAGNOSIS — E1169 Type 2 diabetes mellitus with other specified complication: Secondary | ICD-10-CM

## 2016-04-28 DIAGNOSIS — E669 Obesity, unspecified: Secondary | ICD-10-CM

## 2016-04-28 LAB — CBC WITH DIFFERENTIAL/PLATELET
BASOS: 0 %
Basophils Absolute: 0 10*3/uL (ref 0.0–0.2)
EOS (ABSOLUTE): 0.2 10*3/uL (ref 0.0–0.4)
EOS: 3 %
HEMATOCRIT: 41.9 % (ref 34.0–46.6)
Hemoglobin: 13.9 g/dL (ref 11.1–15.9)
Immature Grans (Abs): 0 10*3/uL (ref 0.0–0.1)
Immature Granulocytes: 0 %
LYMPHS ABS: 2.3 10*3/uL (ref 0.7–3.1)
Lymphs: 39 %
MCH: 29.8 pg (ref 26.6–33.0)
MCHC: 33.2 g/dL (ref 31.5–35.7)
MCV: 90 fL (ref 79–97)
MONOS ABS: 0.4 10*3/uL (ref 0.1–0.9)
Monocytes: 7 %
Neutrophils Absolute: 3 10*3/uL (ref 1.4–7.0)
Neutrophils: 51 %
Platelets: 273 10*3/uL (ref 150–379)
RBC: 4.67 x10E6/uL (ref 3.77–5.28)
RDW: 14.6 % (ref 12.3–15.4)
WBC: 6 10*3/uL (ref 3.4–10.8)

## 2016-04-28 LAB — COMPREHENSIVE METABOLIC PANEL
A/G RATIO: 2 (ref 1.2–2.2)
ALK PHOS: 65 IU/L (ref 39–117)
ALT: 24 IU/L (ref 0–32)
AST: 17 IU/L (ref 0–40)
Albumin: 4.5 g/dL (ref 3.6–4.8)
BUN/Creatinine Ratio: 25 (ref 12–28)
BUN: 13 mg/dL (ref 8–27)
Bilirubin Total: 0.4 mg/dL (ref 0.0–1.2)
CALCIUM: 10.1 mg/dL (ref 8.7–10.3)
CO2: 26 mmol/L (ref 18–29)
Chloride: 98 mmol/L (ref 96–106)
Creatinine, Ser: 0.52 mg/dL — ABNORMAL LOW (ref 0.57–1.00)
GFR calc Af Amer: 120 mL/min/{1.73_m2} (ref >59)
GFR, EST NON AFRICAN AMERICAN: 104 mL/min/{1.73_m2} (ref >59)
GLOBULIN, TOTAL: 2.2 g/dL (ref 1.5–4.5)
Glucose: 81 mg/dL (ref 65–99)
POTASSIUM: 4 mmol/L (ref 3.5–5.2)
SODIUM: 140 mmol/L (ref 134–144)
Total Protein: 6.7 g/dL (ref 6.0–8.5)

## 2016-04-28 LAB — VITAMIN D 25 HYDROXY (VIT D DEFICIENCY, FRACTURES): VIT D 25 HYDROXY: 51.6 ng/mL (ref 30.0–100.0)

## 2016-04-28 LAB — IRON AND TIBC
Iron Saturation: 16 % (ref 15–55)
Iron: 42 ug/dL (ref 27–159)
TIBC: 264 ug/dL (ref 250–450)
UIBC: 222 ug/dL (ref 131–425)

## 2016-04-28 LAB — LIPID PANEL
CHOL/HDL RATIO: 3.9 ratio (ref 0.0–4.4)
Cholesterol, Total: 169 mg/dL (ref 100–199)
HDL: 43 mg/dL (ref >39)
LDL Calculated: 105 mg/dL — ABNORMAL HIGH (ref 0–99)
TRIGLYCERIDES: 106 mg/dL (ref 0–149)
VLDL Cholesterol Cal: 21 mg/dL (ref 5–40)

## 2016-04-28 LAB — VITAMIN B12: Vitamin B-12: >2000 pg/mL — ABNORMAL HIGH (ref 232–1245)

## 2016-04-28 NOTE — Progress Notes (Signed)
Follow-up visit:  Sleeve Gastrectomy Weeks Post-Operative 8 Surgery  Medical Nutrition Therapy:  Appt start time: 5:15 end time:  5:50  Primary concerns today: Post-operative Bariatric Surgery Nutrition Management. Pt states she currently has a sinus infection. Pt states she is taking colace and benefiber to move her bowels. Pt states she only had trouble with pinto beans. Pt states she sees her surgeon this week. Pts B12 is 2000. Likes to be called Bethany Scott  Waist circumference: 44.71 Surgery date: 03/01/2016 Surgery type: Sleeve Gastrectomy  [facility] weight at [facility]: 234.1 pounds Weight today: 201.6 pounds  Weight change: 9.8  TANITA  BODY COMP RESULTS  03/16/2016 04/28/2016   BMI (kg/m^2) 33.1 31.6   Fat Mass (lbs) 103.4 85.4   Fat Free Mass (lbs) 108 116.2   Total Body Water (lbs) 77.6 83    24-hr recall: B (AM): eggs or yogurt or shake (30g) Snk (AM):  L (PM): chicken (3-4 oz 21g) Snk (PM):  D (PM): salmon (21g) Snk (PM):   Fluid intake: 64+ fluid ounces vitamin water Estimated total protein intake: 72g   Medications: See list Supplementation: taking per recommendations also biotin and vitamin b12   CBG monitoring:  Average CBG per patient: under 100 Last patient reported A1c:   Using straws: no Drinking while eating: a little bit Having you been chewing well: no Chewing/swallowing difficulties: no Changes in vision: no Changes to mood/headaches: no Hair loss/Changes to skin/Changes to nails: no Any difficulty focusing or concentrating: no Sweating: no Dizziness/Lightheaded:  Palpitations: no Carbonated beverages: no N/V/D/C/GAS: just constipation  Abdominal Pain: no Dumping syndrome: no  Recent physical activity:  walking  Progress Towards Goal(s):  In progress.  Handouts given during visit include:  NS veggies + protein   Nutritional Diagnosis:  [place]-3.3 Overweight/obesity related to past poor dietary habits and physical inactivity as evidenced by  patient w/ recent Sleeve Gastrectomy surgery following dietary guidelines for continued weight loss.    Intervention:  Nutrition counseling.Dietitian educated the pt on advancing her diet to include non-starchy vegetables. Goals: -Try to eat beans every other day -Try not to weigh more than once a week  -Eat all of your protein before eating your vegetables -Try zucchini noodles  -Try spaghetti squash -Stop taking your B12  Teaching Method Utilized:  Visual Auditory Hands on  Barriers to learning/adherence to lifestyle change: none identified   Demonstrated degree of understanding via:  Teach Back   Monitoring/Evaluation:  Dietary intake, exercise, lap band fills, and body weight.

## 2016-04-28 NOTE — Patient Instructions (Addendum)
-  Try to eat beans every other day  -Try not to weigh more than once a week   -Eat all of your protein before eating your vegetables  -Try zucchini noodles   -Try spaghetti squash  -Stop taking your B12

## 2016-04-30 LAB — CULTURE, GROUP A STREP: Strep A Culture: NEGATIVE

## 2016-05-04 DIAGNOSIS — M791 Myalgia: Secondary | ICD-10-CM | POA: Diagnosis not present

## 2016-05-04 DIAGNOSIS — M5137 Other intervertebral disc degeneration, lumbosacral region: Secondary | ICD-10-CM | POA: Diagnosis not present

## 2016-05-04 DIAGNOSIS — M5416 Radiculopathy, lumbar region: Secondary | ICD-10-CM | POA: Diagnosis not present

## 2016-05-04 DIAGNOSIS — M47817 Spondylosis without myelopathy or radiculopathy, lumbosacral region: Secondary | ICD-10-CM | POA: Diagnosis not present

## 2016-05-04 DIAGNOSIS — M5136 Other intervertebral disc degeneration, lumbar region: Secondary | ICD-10-CM | POA: Diagnosis not present

## 2016-05-04 DIAGNOSIS — M48061 Spinal stenosis, lumbar region without neurogenic claudication: Secondary | ICD-10-CM | POA: Diagnosis not present

## 2016-05-04 DIAGNOSIS — G894 Chronic pain syndrome: Secondary | ICD-10-CM | POA: Diagnosis not present

## 2016-05-04 DIAGNOSIS — Z79891 Long term (current) use of opiate analgesic: Secondary | ICD-10-CM | POA: Diagnosis not present

## 2016-05-04 DIAGNOSIS — M47816 Spondylosis without myelopathy or radiculopathy, lumbar region: Secondary | ICD-10-CM | POA: Diagnosis not present

## 2016-05-04 DIAGNOSIS — M7062 Trochanteric bursitis, left hip: Secondary | ICD-10-CM | POA: Diagnosis not present

## 2016-05-04 DIAGNOSIS — M169 Osteoarthritis of hip, unspecified: Secondary | ICD-10-CM | POA: Diagnosis not present

## 2016-05-04 DIAGNOSIS — M7061 Trochanteric bursitis, right hip: Secondary | ICD-10-CM | POA: Diagnosis not present

## 2016-05-31 ENCOUNTER — Other Ambulatory Visit: Payer: Self-pay | Admitting: Family Medicine

## 2016-05-31 DIAGNOSIS — J301 Allergic rhinitis due to pollen: Secondary | ICD-10-CM

## 2016-06-01 DIAGNOSIS — M7061 Trochanteric bursitis, right hip: Secondary | ICD-10-CM | POA: Diagnosis not present

## 2016-06-01 DIAGNOSIS — M5136 Other intervertebral disc degeneration, lumbar region: Secondary | ICD-10-CM | POA: Diagnosis not present

## 2016-06-01 DIAGNOSIS — M169 Osteoarthritis of hip, unspecified: Secondary | ICD-10-CM | POA: Diagnosis not present

## 2016-06-01 DIAGNOSIS — M791 Myalgia: Secondary | ICD-10-CM | POA: Diagnosis not present

## 2016-06-01 DIAGNOSIS — M47817 Spondylosis without myelopathy or radiculopathy, lumbosacral region: Secondary | ICD-10-CM | POA: Diagnosis not present

## 2016-06-01 DIAGNOSIS — Z79891 Long term (current) use of opiate analgesic: Secondary | ICD-10-CM | POA: Diagnosis not present

## 2016-06-01 DIAGNOSIS — M5416 Radiculopathy, lumbar region: Secondary | ICD-10-CM | POA: Diagnosis not present

## 2016-06-01 DIAGNOSIS — M47816 Spondylosis without myelopathy or radiculopathy, lumbar region: Secondary | ICD-10-CM | POA: Diagnosis not present

## 2016-06-01 DIAGNOSIS — M7062 Trochanteric bursitis, left hip: Secondary | ICD-10-CM | POA: Diagnosis not present

## 2016-06-01 DIAGNOSIS — M48061 Spinal stenosis, lumbar region without neurogenic claudication: Secondary | ICD-10-CM | POA: Diagnosis not present

## 2016-06-01 DIAGNOSIS — M5137 Other intervertebral disc degeneration, lumbosacral region: Secondary | ICD-10-CM | POA: Diagnosis not present

## 2016-06-01 DIAGNOSIS — G894 Chronic pain syndrome: Secondary | ICD-10-CM | POA: Diagnosis not present

## 2016-06-15 DIAGNOSIS — L812 Freckles: Secondary | ICD-10-CM | POA: Diagnosis not present

## 2016-06-15 DIAGNOSIS — L821 Other seborrheic keratosis: Secondary | ICD-10-CM | POA: Diagnosis not present

## 2016-06-15 DIAGNOSIS — D229 Melanocytic nevi, unspecified: Secondary | ICD-10-CM | POA: Diagnosis not present

## 2016-06-15 DIAGNOSIS — L820 Inflamed seborrheic keratosis: Secondary | ICD-10-CM | POA: Diagnosis not present

## 2016-06-16 ENCOUNTER — Encounter: Payer: Self-pay | Admitting: Skilled Nursing Facility1

## 2016-06-16 ENCOUNTER — Encounter: Payer: 59 | Attending: General Surgery | Admitting: Skilled Nursing Facility1

## 2016-06-16 DIAGNOSIS — Z713 Dietary counseling and surveillance: Secondary | ICD-10-CM | POA: Diagnosis not present

## 2016-06-16 DIAGNOSIS — E669 Obesity, unspecified: Secondary | ICD-10-CM

## 2016-06-16 DIAGNOSIS — E1169 Type 2 diabetes mellitus with other specified complication: Secondary | ICD-10-CM

## 2016-06-16 NOTE — Patient Instructions (Addendum)
-  Try kefir in the yogurt aisle

## 2016-06-16 NOTE — Progress Notes (Signed)
Follow-up visit:  Sleeve Gastrectomy Weeks Post-Operative 8 Surgery  Medical Nutrition Therapy:  Appt start time: 5:15 end time:  5:50  Primary concerns today: Post-operative Bariatric Surgery Nutrition Management.  Pt states she is feeling good with good energy.   Waist circumference: 44.71 Surgery date: 03/01/2016 Surgery type: Sleeve Gastrectomy  [facility]: 234.1 pounds Weight today: 187.6 pounds  Weight change: 13.8   TANITA  BODY COMP RESULTS  03/16/2016 04/28/2016 06/16/2016   BMI (kg/m^2) 33.1 31.6 28.9   Fat Mass (lbs) 103.4 85.4 71.6   Fat Free Mass (lbs) 108 116.2 116   Total Body Water (lbs) 77.6 83 82.2    24-hr recall: B (AM): eggs or yogurt or shake (30g) Snk (AM):  L (PM): chicken and zucchini with spagetti sauce (3-4 oz 21g) Snk (PM): greek yogurt  D (PM): salmon and vegetables (21g) Snk (PM):   Fluid intake: 90+ fluid ounces vitamin water Estimated total protein intake: 72g   Medications: See list Supplementation: taking per recommendations also biotin and vitamin  CBG monitoring: not monitoring anymore  Average CBG per patient:  Last patient reported A1c:   Using straws: no Drinking while eating: a little bit Having you been chewing well: no Chewing/swallowing difficulties: no Changes in vision: no Changes to mood/headaches: no Hair loss/Changes to skin/Changes to nails: a little shedding Any difficulty focusing or concentrating: no Sweating: no Dizziness/Lightheaded:  Palpitations: no Carbonated beverages: no N/V/D/C/GAS: just constipation  Abdominal Pain: no Dumping syndrome: no  Recent physical activity:  Walking, starting water aerobics   Progress Towards Goal(s):  In progress.  Handouts given during visit include:  NS veggies + protein   Nutritional Diagnosis:  [place]-3.3 Overweight/obesity related to past poor dietary habits and physical inactivity as evidenced by patient w/ recent Sleeve Gastrectomy surgery following  dietary guidelines for continued weight loss.    Intervention:  Nutrition counseling.Dietitian educated the pt on advancing her diet to include non-starchy vegetables. Goals: -Try kefir  Teaching Method Utilized:  Visual Auditory Hands on  Barriers to learning/adherence to lifestyle change: none identified   Demonstrated degree of understanding via:  Teach Back   Monitoring/Evaluation:  Dietary intake, exercise, lap band fills, and body weight.

## 2016-06-24 ENCOUNTER — Encounter: Payer: Self-pay | Admitting: Family Medicine

## 2016-06-25 MED ORDER — NITROFURANTOIN MONOHYD MACRO 100 MG PO CAPS: ORAL_CAPSULE | ORAL | 0 refills | Status: DC

## 2016-06-25 MED ORDER — PHENAZOPYRIDINE HCL 100 MG PO TABS: 100.0000 mg | ORAL_TABLET | Freq: Three times a day (TID) | ORAL | 0 refills | Status: DC | PRN

## 2016-06-29 DIAGNOSIS — M47817 Spondylosis without myelopathy or radiculopathy, lumbosacral region: Secondary | ICD-10-CM | POA: Diagnosis not present

## 2016-06-29 DIAGNOSIS — Z79891 Long term (current) use of opiate analgesic: Secondary | ICD-10-CM | POA: Diagnosis not present

## 2016-06-29 DIAGNOSIS — M791 Myalgia: Secondary | ICD-10-CM | POA: Diagnosis not present

## 2016-06-29 DIAGNOSIS — G894 Chronic pain syndrome: Secondary | ICD-10-CM | POA: Diagnosis not present

## 2016-06-29 DIAGNOSIS — M48061 Spinal stenosis, lumbar region without neurogenic claudication: Secondary | ICD-10-CM | POA: Diagnosis not present

## 2016-06-29 DIAGNOSIS — M169 Osteoarthritis of hip, unspecified: Secondary | ICD-10-CM | POA: Diagnosis not present

## 2016-06-29 DIAGNOSIS — M47816 Spondylosis without myelopathy or radiculopathy, lumbar region: Secondary | ICD-10-CM | POA: Diagnosis not present

## 2016-06-29 DIAGNOSIS — M7061 Trochanteric bursitis, right hip: Secondary | ICD-10-CM | POA: Diagnosis not present

## 2016-06-29 DIAGNOSIS — M7062 Trochanteric bursitis, left hip: Secondary | ICD-10-CM | POA: Diagnosis not present

## 2016-06-29 DIAGNOSIS — M5137 Other intervertebral disc degeneration, lumbosacral region: Secondary | ICD-10-CM | POA: Diagnosis not present

## 2016-06-29 DIAGNOSIS — M5416 Radiculopathy, lumbar region: Secondary | ICD-10-CM | POA: Diagnosis not present

## 2016-06-29 DIAGNOSIS — M5136 Other intervertebral disc degeneration, lumbar region: Secondary | ICD-10-CM | POA: Diagnosis not present

## 2016-07-06 DIAGNOSIS — L650 Telogen effluvium: Secondary | ICD-10-CM | POA: Diagnosis not present

## 2016-07-06 DIAGNOSIS — L659 Nonscarring hair loss, unspecified: Secondary | ICD-10-CM | POA: Diagnosis not present

## 2016-07-26 ENCOUNTER — Encounter: Payer: Self-pay | Admitting: Family Medicine

## 2016-07-27 DIAGNOSIS — M5416 Radiculopathy, lumbar region: Secondary | ICD-10-CM | POA: Diagnosis not present

## 2016-07-27 DIAGNOSIS — M169 Osteoarthritis of hip, unspecified: Secondary | ICD-10-CM | POA: Diagnosis not present

## 2016-07-27 DIAGNOSIS — M791 Myalgia: Secondary | ICD-10-CM | POA: Diagnosis not present

## 2016-07-27 DIAGNOSIS — G894 Chronic pain syndrome: Secondary | ICD-10-CM | POA: Diagnosis not present

## 2016-07-27 DIAGNOSIS — M47816 Spondylosis without myelopathy or radiculopathy, lumbar region: Secondary | ICD-10-CM | POA: Diagnosis not present

## 2016-07-27 DIAGNOSIS — M7062 Trochanteric bursitis, left hip: Secondary | ICD-10-CM | POA: Diagnosis not present

## 2016-07-27 DIAGNOSIS — Z79891 Long term (current) use of opiate analgesic: Secondary | ICD-10-CM | POA: Diagnosis not present

## 2016-07-27 DIAGNOSIS — M48061 Spinal stenosis, lumbar region without neurogenic claudication: Secondary | ICD-10-CM | POA: Diagnosis not present

## 2016-07-27 DIAGNOSIS — M5137 Other intervertebral disc degeneration, lumbosacral region: Secondary | ICD-10-CM | POA: Diagnosis not present

## 2016-07-27 DIAGNOSIS — M5136 Other intervertebral disc degeneration, lumbar region: Secondary | ICD-10-CM | POA: Diagnosis not present

## 2016-07-27 DIAGNOSIS — M7061 Trochanteric bursitis, right hip: Secondary | ICD-10-CM | POA: Diagnosis not present

## 2016-07-27 DIAGNOSIS — M47817 Spondylosis without myelopathy or radiculopathy, lumbosacral region: Secondary | ICD-10-CM | POA: Diagnosis not present

## 2016-08-02 ENCOUNTER — Other Ambulatory Visit: Payer: Self-pay | Admitting: Family Medicine

## 2016-08-04 ENCOUNTER — Other Ambulatory Visit: Payer: Self-pay | Admitting: Emergency Medicine

## 2016-08-04 MED ORDER — HYDROCORTISONE ACETATE 25 MG RE SUPP: 25.0000 mg | Freq: Two times a day (BID) | RECTAL | 0 refills | Status: DC | PRN

## 2016-08-11 ENCOUNTER — Other Ambulatory Visit: Payer: Self-pay | Admitting: Family Medicine

## 2016-08-11 ENCOUNTER — Encounter: Payer: Self-pay | Admitting: Family Medicine

## 2016-08-11 ENCOUNTER — Telehealth: Payer: 59 | Admitting: Family

## 2016-08-11 DIAGNOSIS — J019 Acute sinusitis, unspecified: Secondary | ICD-10-CM

## 2016-08-11 MED ORDER — DOXYCYCLINE HYCLATE 100 MG PO TABS: 100.0000 mg | ORAL_TABLET | Freq: Two times a day (BID) | ORAL | 0 refills | Status: DC

## 2016-08-11 NOTE — Progress Notes (Signed)

## 2016-08-16 ENCOUNTER — Encounter: Payer: Self-pay | Admitting: Family Medicine

## 2016-08-16 MED ORDER — PHENAZOPYRIDINE HCL 100 MG PO TABS: 100.0000 mg | ORAL_TABLET | Freq: Three times a day (TID) | ORAL | 2 refills | Status: DC | PRN

## 2016-08-16 MED ORDER — HYDROCORTISONE ACETATE 25 MG RE SUPP: 25.0000 mg | Freq: Two times a day (BID) | RECTAL | 2 refills | Status: DC | PRN

## 2016-08-18 ENCOUNTER — Encounter: Payer: Self-pay | Admitting: Family Medicine

## 2016-08-19 ENCOUNTER — Telehealth: Payer: 59 | Admitting: Physician Assistant

## 2016-08-19 DIAGNOSIS — J329 Chronic sinusitis, unspecified: Secondary | ICD-10-CM

## 2016-08-19 NOTE — Progress Notes (Signed)
Based on what you shared with me it looks like you have a condition that should be evaluated in a face to face office visit.  If symptoms are not responding to,or worsening with the antibiotics, you need a good physical examination and assessment to determine appropriate treatment. We do not treat via e-visit if symptoms are not responding to an initial treatment plan  NOTE: Even if you have entered your credit card information for this eVisit, you will not be charged.   If you are having a true medical emergency please call 911.  If you need an urgent face to face visit, [facility] has four urgent care centers for your convenience.  If you need care fast and have a high deductible or no insurance consider:   [email or web address]  [phone]  [address], [place], [place] [address] 8 am to 8 pm Monday-Friday 10 am to 4 pm Saturday-Sunday   The following sites will take your  insurance:    . [facility] [place] a Provider at this Location  [address] [place], [place] [address] . 10 am to 8 pm Monday-Friday . 12 pm to 8 pm Saturday-Sunday   . [facility] [place] a Provider at this Location  [address], [place], [place] [address] . 8 am to 8 pm Monday-Friday . 9 am to 6 pm Saturday . 11 am to 6 pm Sunday   . [facility] at [place] Get Driving Directions  [address] [place], [place] [address] . 8 am to 8 pm Monday-Friday . 8 am to 4 pm Saturday-Sunday   Your e-visit answers were reviewed by a board certified advanced clinical practitioner to complete your personal care plan.  Thank you for using e-Visits.

## 2016-08-20 ENCOUNTER — Encounter (HOSPITAL_COMMUNITY): Payer: Self-pay | Admitting: Family Medicine

## 2016-08-20 ENCOUNTER — Ambulatory Visit (HOSPITAL_COMMUNITY)
Admission: EM | Admit: 2016-08-20 | Discharge: 2016-08-20 | Disposition: A | Discharge status: Home / Self Care | Payer: 59 | Attending: Family Medicine | Admitting: Family Medicine

## 2016-08-20 DIAGNOSIS — J0100 Acute maxillary sinusitis, unspecified: Secondary | ICD-10-CM | POA: Diagnosis not present

## 2016-08-20 MED ORDER — METHYLPREDNISOLONE 4 MG PO TBPK: ORAL_TABLET | ORAL | 0 refills | Status: DC

## 2016-08-20 MED ORDER — LEVOFLOXACIN 500 MG PO TABS: 500.0000 mg | ORAL_TABLET | Freq: Every day | ORAL | 0 refills | Status: DC

## 2016-08-20 NOTE — ED Provider Notes (Signed)
CSN: [id number]     Arrival date & time 08/20/16  1018 History   First MD Initiated Contact with Patient 08/20/16 1123     Chief Complaint  Patient presents with  . Nasal Congestion   (Consider location/radiation/quality/duration/timing/severity/associated sxs/prior Treatment) Patient c/o maxillary sinus pain and pressure.  Patient c/o fever and uri sx's for last week.   The history is provided by the patient.  URI  Presenting symptoms: congestion, facial pain, fatigue, fever and rhinorrhea   Severity:  Moderate Onset quality:  Sudden Duration:  1 week Timing:  Constant Progression:  Worsening Relieved by:  None tried Worsened by:  Nothing Ineffective treatments:  None tried   Past Medical History:  Diagnosis Date  . Allergy   . Anemia   . Anxiety   . Arthritis    OA Knee; non-specific autoimmune process followed by [facility]/Rheumatology.  . Asthma   . Blood transfusion without reported diagnosis YRS AGO  . Depression   . Diabetes mellitus without complication ([place])   . Gastric ulcer, unspecified as acute or chronic, without mention of hemorrhage, perforation, or obstruction YRS AGO  . GERD (gastroesophageal reflux disease)   . Hyperlipidemia   . Hypertension   . Migraine    hx of migraines  . Obesity, unspecified   . Other chronic cystitis   . Personal history of colonic polyps   . PONV (postoperative nausea and vomiting)    NAUSEA, SCOPOLAMINE PATCH HELPED WITH LAST SURGERIES  . Pre-diabetes    Past Surgical History:  Procedure Laterality Date  . ABDOMINAL HYSTERECTOMY  01/25/1994   uterine fibroids; DUB; cervical dysplasia; ovaries resected two years later.    . APPENDECTOMY    . BILATERAL OOPHORECTOMY  2001  . CESAREAN SECTION     x 2  . COLON SURGERY  YRS AGO   SIGMOIDECTOMY  . Colonoscopy  09/08/2012   diverticulosis, hemorrhoids.  [name].  Symptoms:  anemia, hemoccult +.  . ESOPHAGOGASTRODUODENOSCOPY  09/08/2012   +HH.  [name].  Symptoms:  anemia, hemoccult +.  [name] LAPAROSCOPIC GASTRIC SLEEVE RESECTION N/A 03/01/2016   Procedure: LAPAROSCOPIC GASTRIC SLEEVE RESECTION, UPPER ENDO;  Surgeon: [name], MD;  Location: [facility];  Service: General;  Laterality: N/A;  . LAPAROSCOPIC LYSIS OF ADHESIONS N/A 02/10/2016   Procedure: LAPAROSCOPIC LYSIS OF ADHESIONS;  Surgeon: [name], MD;  Location: [facility];  Service: General;  Laterality: N/A;  . LAPAROSCOPY N/A 02/10/2016   Procedure: LAPAROSCOPY DIAGNOSTIC;  Surgeon: [name], MD;  Location: [facility];  Service: General;  Laterality: N/A;  . NASAL SEPTUM SURGERY    . NECK SURGERY    . Rheumatology Consult  11/26/2011   multiple arthralgias. Autoimmune labs negative.  Rx for Plaquenil for non-specific autoimmune process.  [name].  [name] SIGMOID RESECTION / RECTOPEXY    . SPINE SURGERY    . TONSILLECTOMY AND ADENOIDECTOMY     Family History  Problem Relation Age of Onset  . Bipolar disorder Father   . Transient ischemic attack Father   . Dementia Father        secondary to head trauma  . Hypertension Father   . Diabetes Father   . CAD Father   . Cancer Mother 51       lung  . Migraines Mother   . Hypothyroidism Mother   . Hypertension Sister   . Atrial fibrillation Sister   . Hypothyroidism Sister   . Alcohol abuse Brother   . Hypertension Brother   . Alcohol  abuse Brother   . Atrial fibrillation Maternal Grandmother   . COPD Maternal Grandmother   . Diabetes Maternal Grandmother   . Hyperlipidemia Maternal Grandmother   . Diabetes Maternal Grandfather   . Diabetes Paternal Grandfather   . Heart disease Paternal Grandfather    Social History  Substance Use Topics  . Smoking status: Former Smoker    Packs/day: 1.00    Years: 5.00  . Smokeless tobacco: Never Used     Comment: Quit 26 years ago  . Alcohol use Yes     Comment: rare   OB History    No data available     Review of Systems  Constitutional: Positive for fatigue and fever.  HENT: Positive for  congestion and rhinorrhea.   Eyes: Negative.   Respiratory: Negative.   Cardiovascular: Negative.   Gastrointestinal: Negative.   Endocrine: Negative.   Genitourinary: Negative.   Musculoskeletal: Negative.   Allergic/Immunologic: Negative.   Neurological: Negative.   Hematological: Negative.   Psychiatric/Behavioral: Negative.     Allergies  Cephalosporins and Penicillins  Home Medications   Prior to Admission medications   Medication Sig Start Date End Date Taking? Authorizing Provider  atorvastatin (LIPITOR) 20 MG tablet Take 1 tablet (20 mg total) by mouth daily at 6 PM. 04/27/16   [name], MD  beclomethasone (QVAR) 80 MCG/ACT inhaler Inhale 2 puffs into the lungs 2 (two) times daily. 04/27/16   [name], MD  Blood Glucose Monitoring Suppl KIT 1 each by Does not apply route as needed. Please dispense the monitor and supplies compliant with pt insurance [facility] 08/03/13   Harrison Mons, PA-C  cetirizine (ZYRTEC) 10 MG tablet Take 10 mg by mouth daily.    [provider]  Cholecalciferol (VITAMIN D3) 5000 units CAPS Take 5,000 Units by mouth daily.    [provider]  diclofenac (FLECTOR) 1.3 % PTCH Apply one patch or a portion of patch to painful area of skin twice per day if tolerated Patient taking differently: Place 1 patch onto the skin 2 (two) times daily as needed (pain). Apply one patch or a portion of patch to painful area of skin twice per day if tolerated 09/25/15   Mohammed Kindle, MD  doxycycline (VIBRA-TABS) 100 MG tablet Take 1 tablet (100 mg total) by mouth 2 (two) times daily. 08/11/16   Sharion Balloon, FNP  DULoxetine (CYMBALTA) 60 MG capsule Take 60 mg by mouth 2 (two) times daily.    [provider]  DULoxetine (CYMBALTA) 60 MG capsule TAKE 1 CAPSULE (60 MG TOTAL) BY MOUTH 2 (TWO) TIMES DAILY. 05/31/16   [name], MD  HYDROcodone-acetaminophen (NORCO/VICODIN) 5-325 MG tablet Take 1 tablet by mouth every 6 (six) hours as  needed for moderate pain. 03/03/16   Meuth, Blaine Hamper, PA-C  hydrocortisone (ANUSOL-HC) 25 MG suppository Place 1 suppository (25 mg total) rectally 2 (two) times daily as needed for hemorrhoids. 08/16/16   [name], MD  IRON PO Take 1 tablet by mouth 3 (three) times daily.    [provider]  levofloxacin (LEVAQUIN) 500 MG tablet Take 1 tablet (500 mg total) by mouth daily. 08/20/16   Lysbeth Penner, FNP  levofloxacin (LEVAQUIN) 750 MG tablet Take 1 tablet (750 mg total) by mouth daily. 04/27/16   [name], MD  lisinopril (PRINIVIL,ZESTRIL) 5 MG tablet Take 1 tablet (5 mg total) by mouth daily. 10/28/15   [name], MD  LYSINE PO Take 1 tablet  by mouth daily.    [provider]  methylPREDNISolone (MEDROL DOSEPAK) 4 MG TBPK tablet Take 6-5-4-3-2-1 po qd 08/20/16   Lysbeth Penner, FNP  montelukast (SINGULAIR) 10 MG tablet TAKE 1 TABLET BY MOUTH DAILY. 05/31/16   [name], MD  nitrofurantoin, macrocrystal-monohydrate, (MACROBID) 100 MG capsule TAKE 1 CAPSULE (100 MG TOTAL) BY MOUTH 2 (TWO) TIMES DAILY AS NEEDED. 06/25/16   [name], MD  pantoprazole (PROTONIX) 40 MG tablet Take 1 tablet (40 mg total) by mouth 2 (two) times daily. 10/28/15   [name], MD  phenazopyridine (PYRIDIUM) 100 MG tablet Take 1-2 tablets (100-200 mg total) by mouth 3 (three) times daily as needed for pain. 08/16/16   [name], MD  promethazine (PHENERGAN) 25 MG tablet Take 1 tablet (25 mg total) by mouth every 8 (eight) hours as needed for nausea or vomiting. 03/05/16   [name], MD  rizatriptan (MAXALT) 10 MG tablet Take 10 mg by mouth as needed. May repeat in 2 hours if needed    [provider]   Meds Ordered and Administered this Visit  Medications - No data to display  BP 118/67 (BP Location: Left Arm)   Pulse 86   Temp 98.2 F (36.8 C) (Oral)   Resp 16   SpO2 100%  No data found.   Physical Exam  Constitutional: She is oriented to person,  place, and time. She appears well-developed and well-nourished.  HENT:  Head: Normocephalic and atraumatic.  Right Ear: External ear normal.  Left Ear: External ear normal.  Mouth/Throat: Oropharynx is clear and moist.  Eyes: Pupils are equal, round, and reactive to light. Conjunctivae and EOM are normal.  Neck: Normal range of motion. Neck supple.  Cardiovascular: Normal rate, regular rhythm and normal heart sounds.   Pulmonary/Chest: Effort normal and breath sounds normal.  Abdominal: Soft. Bowel sounds are normal.  Neurological: She is alert and oriented to person, place, and time.  Nursing note and vitals reviewed.   Urgent Care Course     Procedures (including critical care time)  Labs Review Labs Reviewed - No data to display  Imaging Review No results found.   Visual Acuity Review  Right Eye Distance:   Left Eye Distance:   Bilateral Distance:    Right Eye Near:   Left Eye Near:    Bilateral Near:         MDM   1. Acute maxillary sinusitis, recurrence not specified    Levaquin 577m one po qd x 10 days  Medrol dose pack as directed #21    OLysbeth Penner FNP 08/20/16 1141

## 2016-08-20 NOTE — ED Triage Notes (Signed)
Pt here for possible sinus infection

## 2016-08-21 MED ORDER — ESTROGENS, CONJUGATED 0.625 MG/GM VA CREA: 1.0000 | TOPICAL_CREAM | Freq: Every day | VAGINAL | 12 refills | Status: DC

## 2016-08-24 DIAGNOSIS — M5416 Radiculopathy, lumbar region: Secondary | ICD-10-CM | POA: Diagnosis not present

## 2016-08-24 DIAGNOSIS — M169 Osteoarthritis of hip, unspecified: Secondary | ICD-10-CM | POA: Diagnosis not present

## 2016-08-24 DIAGNOSIS — M47817 Spondylosis without myelopathy or radiculopathy, lumbosacral region: Secondary | ICD-10-CM | POA: Diagnosis not present

## 2016-08-24 DIAGNOSIS — M791 Myalgia: Secondary | ICD-10-CM | POA: Diagnosis not present

## 2016-08-27 ENCOUNTER — Encounter: Payer: Self-pay | Admitting: Family Medicine

## 2016-09-20 ENCOUNTER — Encounter: Payer: Self-pay | Admitting: Family Medicine

## 2016-09-21 DIAGNOSIS — M47817 Spondylosis without myelopathy or radiculopathy, lumbosacral region: Secondary | ICD-10-CM | POA: Diagnosis not present

## 2016-09-21 DIAGNOSIS — M169 Osteoarthritis of hip, unspecified: Secondary | ICD-10-CM | POA: Diagnosis not present

## 2016-09-21 DIAGNOSIS — M791 Myalgia: Secondary | ICD-10-CM | POA: Diagnosis not present

## 2016-09-21 DIAGNOSIS — M5416 Radiculopathy, lumbar region: Secondary | ICD-10-CM | POA: Diagnosis not present

## 2016-09-22 ENCOUNTER — Telehealth: Payer: 59 | Admitting: Nurse Practitioner

## 2016-09-22 DIAGNOSIS — N3000 Acute cystitis without hematuria: Secondary | ICD-10-CM | POA: Diagnosis not present

## 2016-09-22 MED ORDER — CIPROFLOXACIN HCL 500 MG PO TABS: 500.0000 mg | ORAL_TABLET | Freq: Two times a day (BID) | ORAL | 0 refills | Status: DC

## 2016-09-22 MED ORDER — PHENAZOPYRIDINE HCL 100 MG PO TABS: 100.0000 mg | ORAL_TABLET | Freq: Three times a day (TID) | ORAL | 0 refills | Status: DC | PRN

## 2016-09-22 NOTE — Progress Notes (Signed)
We are sorry that you are not feeling well.  Here is how we plan to help!  Based on what you shared with me it looks like you most likely have a simple urinary tract infection.  A UTI (Urinary Tract Infection) is a bacterial infection of the bladder.  Most cases of urinary tract infections are simple to treat but a key part of your care is to encourage you to drink plenty of fluids and watch your symptoms carefully.  I have prescribed Ciprofloxacin 500 mg twice a day for 5 days.  Your symptoms should gradually improve. Call us if the burning in your urine worsens, you develop worsening fever, back pain or pelvic pain or if your symptoms do not resolve after completing the antibiotic. I also sent in pyridium 100mg  1 po TID for 2 days to help relieve symptoms .  Your symptoms should gradually improve. Call us if the burning in your urine worsens, you develop worsening fever, back pain or pelvic pain or if your symptoms do not resolve after completing the antibiotic.  Urinary tract infections can be prevented by drinking plenty of water to keep your body hydrated.  Also be sure when you wipe, wipe from front to back and don't hold it in!  If possible, empty your bladder every 4 hours.  Your e-visit answers were reviewed by a board certified advanced clinical practitioner to complete your personal care plan.  Depending on the condition, your plan could have included both over the counter or prescription medications.  If there is a problem please reply  once you have received a response from your provider.  Your safety is important to [place].  If you have drug allergies check your prescription carefully.    You can use MyChart to ask questions about today's visit, request a non-urgent call back, or ask for a work or school excuse for 24 hours related to this e-Visit. If it has been greater than 24 hours you will need to follow up with your provider, or enter a new e-Visit to address those  concerns.   You will get an e-mail in the next two days asking about your experience.  I hope that your e-visit has been valuable and will speed your recovery. Thank you for using e-visits.   ===View-only below this line===   ----- Message -----    From: Bethany Scott    Sent: 09/22/2016  1:34 PM EDT      To: E-Visit Mailing List   Urinary tract infections can be prevented by drinking plenty of water to keep your body hydrated.  Also be sure when you wipe, wipe from front to back and don't hold it in!  If possible, empty your bladder every 4 hours.  Your e-visit answers were reviewed by a board certified advanced clinical practitioner to complete your personal care plan.  Depending on the condition, your plan could have included both over the counter or prescription medications.  If there is a problem please reply  once you have received a response from your provider.  Your safety is important to [place].  If you have drug allergies check your prescription carefully.    You can use MyChart to ask questions about today's visit, request a non-urgent call back, or ask for a work or school excuse for 24 hours related to this e-Visit. If it has been greater than 24 hours you will need to follow up with your provider, or enter a new e-Visit to address those  concerns.   You will get an e-mail in the next two days asking about your experience.  I hope that your e-visit has been valuable and will speed your recovery. Thank you for using e-visits.

## 2016-09-25 MED ORDER — NITROFURANTOIN MONOHYD MACRO 100 MG PO CAPS: ORAL_CAPSULE | ORAL | 0 refills | Status: DC

## 2016-09-28 MED ORDER — NITROFURANTOIN MONOHYD MACRO 100 MG PO CAPS: ORAL_CAPSULE | ORAL | 0 refills | Status: DC

## 2016-09-28 NOTE — Addendum Note (Signed)
Addended by: [name] on: 09/28/2016 01:36 PM   Modules accepted: Orders

## 2016-10-19 DIAGNOSIS — M47817 Spondylosis without myelopathy or radiculopathy, lumbosacral region: Secondary | ICD-10-CM | POA: Diagnosis not present

## 2016-10-19 DIAGNOSIS — M5416 Radiculopathy, lumbar region: Secondary | ICD-10-CM | POA: Diagnosis not present

## 2016-10-19 DIAGNOSIS — Z79891 Long term (current) use of opiate analgesic: Secondary | ICD-10-CM | POA: Diagnosis not present

## 2016-10-19 DIAGNOSIS — M169 Osteoarthritis of hip, unspecified: Secondary | ICD-10-CM | POA: Diagnosis not present

## 2016-10-26 NOTE — Progress Notes (Signed)
Subjective:    Patient ID: Bethany Scott, female    DOB: [birth year], 61 y.o.   MRN: [id number]  10/27/2016  Annual Exam   HPI This 61 y.o. female presents for Complete Physical Examination.  Last physical:  10-28-2015 Pap smear:  12-26-2013  Mammogram:  01-2014  [facility] Colonoscopy:  08-2012 Bone density:  n/d Eye exam:  2017 Dental exam:  Every six months.  Obesity: has lost 254.6 pounds in July 2017.  Last week weighed 166.2.  Goal 150s.   Son abusing Adderall and Klonopin.   Went to [employer].  In [place].  Staying for the children.  [facility].  Sore throat, hoarseness: onset in past few days; no fever/chills/sweats.  No headaches.  +rhinorrhea; +nasal congestion; no cough.      Visual Acuity Screening   Right eye Left eye Both eyes  Without correction:     With correction: 20/20 20/20 20/20     BP Readings from Last 3 Encounters:  10/27/16 122/68  08/20/16 118/67  04/27/16 111/70   Wt Readings from Last 3 Encounters:  10/27/16 169 lb (76.7 kg)  06/16/16 187 lb 9.6 oz (85.1 kg)  04/28/16 201 lb 9.6 oz (91.4 kg)   Immunization History  Administered Date(s) Administered  . Hepatitis B, adult 01/02/2013, 04/04/2013, 09/17/2013  . Influenza Split 11/05/2011, 10/25/2012  . Influenza,inj,Quad PF,6+ Mos 10/28/2015  . Influenza-Unspecified 11/25/2013, 11/26/2014  . Pneumococcal Polysaccharide-23 07/07/2010, 10/27/2016  . Tdap 11/23/2006, 10/27/2016    Review of Systems  Constitutional: Negative for activity change, appetite change, chills, diaphoresis, fatigue, fever and unexpected weight change.  HENT: Positive for congestion, postnasal drip, rhinorrhea and voice change. Negative for dental problem, drooling, ear discharge, ear pain, facial swelling, hearing loss, mouth sores, nosebleeds, sinus pain, sinus pressure, sneezing, sore throat, tinnitus and trouble swallowing.   Eyes: Negative for photophobia, pain, discharge, redness, itching and visual  disturbance.  Respiratory: Negative for apnea, cough, choking, chest tightness, shortness of breath, wheezing and stridor.   Cardiovascular: Negative for chest pain, palpitations and leg swelling.  Gastrointestinal: Positive for constipation. Negative for abdominal distention, abdominal pain, anal bleeding, blood in stool, diarrhea, nausea, rectal pain and vomiting.  Endocrine: Negative for cold intolerance, heat intolerance, polydipsia, polyphagia and polyuria.  Genitourinary: Negative for decreased urine volume, difficulty urinating, dyspareunia, dysuria, enuresis, flank pain, frequency, genital sores, hematuria, menstrual problem, pelvic pain, urgency, vaginal bleeding, vaginal discharge and vaginal pain.  Musculoskeletal: Positive for arthralgias and back pain. Negative for gait problem, joint swelling, myalgias, neck pain and neck stiffness.  Skin: Negative for color change, pallor, rash and wound.  Allergic/Immunologic: Negative for environmental allergies, food allergies and immunocompromised state.  Neurological: Negative for dizziness, tremors, seizures, syncope, facial asymmetry, speech difficulty, weakness, light-headedness, numbness and headaches.  Hematological: Negative for adenopathy. Does not bruise/bleed easily.  Psychiatric/Behavioral: Negative for agitation, behavioral problems, confusion, decreased concentration, dysphoric mood, hallucinations, self-injury, sleep disturbance and suicidal ideas. The patient is not nervous/anxious and is not hyperactive.     Past Medical History:  Diagnosis Date  . Allergy   . Anemia   . Anxiety   . Arthritis    OA Knee; non-specific autoimmune process followed by [facility]ology.  . Asthma   . Blood transfusion without reported diagnosis YRS AGO  . Depression   . Diabetes mellitus without complication ([place])   . Gastric ulcer, unspecified as acute or chronic, without mention of hemorrhage, perforation, or obstruction YRS AGO  .  GERD (gastroesophageal reflux disease)   .  Hyperlipidemia   . Hypertension   . Migraine    hx of migraines  . Obesity, unspecified   . Other chronic cystitis   . Personal history of colonic polyps   . PONV (postoperative nausea and vomiting)    NAUSEA, SCOPOLAMINE PATCH HELPED WITH LAST SURGERIES  . Pre-diabetes    Past Surgical History:  Procedure Laterality Date  . ABDOMINAL HYSTERECTOMY  01/25/1994   uterine fibroids; DUB; cervical dysplasia; ovaries resected two years later.    . APPENDECTOMY    . BILATERAL OOPHORECTOMY  2001  . CESAREAN SECTION     x 2  . COLON SURGERY  YRS AGO   SIGMOIDECTOMY  . Colonoscopy  09/08/2012   diverticulosis, hemorrhoids.  [name].  Symptoms:  anemia, hemoccult +.  . ESOPHAGOGASTRODUODENOSCOPY  09/08/2012   +HH.  [name].  Symptoms: anemia, hemoccult +.  [name] LAPAROSCOPIC GASTRIC SLEEVE RESECTION N/A 03/01/2016   Procedure: LAPAROSCOPIC GASTRIC SLEEVE RESECTION, UPPER ENDO;  Surgeon: [name], MD;  Location: [facility];  Service: General;  Laterality: N/A;  . LAPAROSCOPIC LYSIS OF ADHESIONS N/A 02/10/2016   Procedure: LAPAROSCOPIC LYSIS OF ADHESIONS;  Surgeon: [name], MD;  Location: [facility];  Service: General;  Laterality: N/A;  . LAPAROSCOPY N/A 02/10/2016   Procedure: LAPAROSCOPY DIAGNOSTIC;  Surgeon: [name], MD;  Location: [facility];  Service: General;  Laterality: N/A;  . NASAL SEPTUM SURGERY    . NECK SURGERY    . Rheumatology Consult  11/26/2011   multiple arthralgias. Autoimmune labs negative.  Rx for Plaquenil for non-specific autoimmune process.  [name].  [name] SIGMOID RESECTION / RECTOPEXY    . SPINE SURGERY    . TONSILLECTOMY AND ADENOIDECTOMY     Allergies  Allergen Reactions  . Cephalosporins Anaphylaxis  . Penicillins Anaphylaxis    Has patient had a PCN reaction causing immediate rash, facial/tongue/throat swelling, SOB or lightheadedness with hypotension: Yes Has patient had a PCN reaction causing severe rash involving mucus  membranes or skin necrosis: No Has patient had a PCN reaction that required hospitalization Yes Has patient had a PCN reaction occurring within the last 10 years: No If all of the above answers are "NO", then may proceed with Cephalosporin use.     Social History   Social History  . Marital status: Married    Spouse name: N/A  . Number of children: 2  . Years of education: 12   Occupational History  . 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 Smoking status: Former Smoker    Packs/day: 1.00    Years: 5.00  . Smokeless tobacco: Never Used     Comment: Quit 26 years ago  . Alcohol use Yes     Comment: rare  . Drug use: No  . Sexual activity: Yes    Birth control/ protection: Post-menopausal, Surgical   Other Topics Concern  . Not on file   Social History Narrative   Marital status: married since 69 years; happily married; no abuse.      Children: 2 sons (24, 56); 2 grandchildren  (58yo [place], 37 yo August)      Lives: with husband, [name]/son.      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      Tobacco abuse:  Former smoker.      Alcohol:  None; holidays only      Exercise: none in 2017  Seatbelt: 100%; no texting   Family History  Problem Relation Age of Onset  . Bipolar disorder Father   . Transient ischemic attack Father   . Dementia Father        secondary to head trauma  . Hypertension Father   . Diabetes Father   . CAD Father   . Cancer Mother 11       lung  . Migraines Mother   . Hypothyroidism Mother   . Hypertension Sister   . Atrial fibrillation Sister   . Hypothyroidism Sister   . Alcohol abuse Brother   . Hypertension Brother   . Alcohol abuse Brother   . Atrial fibrillation Maternal Grandmother   . COPD Maternal Grandmother   . Diabetes Maternal Grandmother   . Hyperlipidemia Maternal Grandmother   . Diabetes Maternal Grandfather   .  Diabetes Paternal Grandfather   . Heart disease Paternal Grandfather        Objective:    BP 122/68   Pulse (!) 104   Temp 98 F (36.7 C) (Oral)   Resp 16   Ht 5' 5.35" (1.66 m)   Wt 169 lb (76.7 kg)   SpO2 98%   BMI 27.82 kg/m  Physical Exam  Constitutional: She is oriented to person, place, and time. She appears well-developed and well-nourished. No distress.  HENT:  Head: Normocephalic and atraumatic.  Right Ear: External ear normal.  Left Ear: External ear normal.  Nose: Nose normal.  Mouth/Throat: Oropharynx is clear and moist.  Eyes: Pupils are equal, round, and reactive to light. Conjunctivae and EOM are normal.  Neck: Normal range of motion and full passive range of motion without pain. Neck supple. No JVD present. Carotid bruit is not present. No thyromegaly present.  Cardiovascular: Normal rate, regular rhythm, normal heart sounds and intact distal pulses.  Exam reveals no gallop and no friction rub.   No murmur heard. Pulmonary/Chest: Effort normal and breath sounds normal. She has no wheezes. She has no rales. Right breast exhibits no inverted nipple, no mass, no nipple discharge, no skin change and no tenderness. Left breast exhibits no inverted nipple, no mass, no nipple discharge, no skin change and no tenderness. Breasts are symmetrical.  Abdominal: Soft. Bowel sounds are normal. She exhibits no distension and no mass. There is no tenderness. There is no rebound and no guarding.  Musculoskeletal:       Right shoulder: Normal.       Left shoulder: Normal.       Cervical back: Normal.  Lymphadenopathy:    She has no cervical adenopathy.  Neurological: She is alert and oriented to person, place, and time. She has normal reflexes. No cranial nerve deficit. She exhibits normal muscle tone. Coordination normal.  Skin: Skin is warm and dry. No rash noted. She is not diaphoretic. No erythema. No pallor.  Psychiatric: She has a normal mood and affect. Her behavior is  normal. Judgment and thought content normal.  Nursing note and vitals reviewed.   No results found. Depression screen [facility] 2/9 10/27/2016 04/27/2016 02/04/2016 10/28/2015 10/06/2015  Decreased Interest 0 0 0 0 0  Down, Depressed, Hopeless 0 0 0 0 0  PHQ - 2 Score 0 0 0 0 0   Fall Risk  10/27/2016 04/27/2016 02/04/2016 10/28/2015 10/06/2015  Falls in the past year? No No No No No  Comment - - - - -  Number falls in past yr: - - - - -  Injury with Fall? - - - - -  Comment - - - - -  Risk for fall due to : - - - - -  Follow up - - - - -        Assessment & Plan:   1. Routine physical examination   2. Essential hypertension, benign   3. Diabetes mellitus type 2 in obese ([place])   4. DDD (degenerative disc disease), lumbar   5. Depression with anxiety   6. Migraine without aura and without status migrainosus, not intractable   7. Primary osteoarthritis of both knees   8. Pure hypercholesterolemia   9. Screening for breast cancer   10. Encounter for screening mammogram for breast cancer   11. BMI 27.0-27.9,adult   12. Acute recurrent maxillary sinusitis   13. Gastroesophageal reflux disease without esophagitis   14. S/P laparoscopic sleeve gastrectomy    -anticipatory guidance provided --- exercise, weight loss, safe driving practices, aspirin 81mg  daily. -obtain age appropriate screening labs and labs for chronic disease management. -s/p gastric sleeve with significant weight loss; congratulations on progress!    -continues to suffer with ongoing family stressors; coping well at this time; continue psychotherapy with [facility] and continue current medications.   -suffering with constipation due gastric sleeve; rx for Linzess provided; to discuss further with surgeon in upcoming months. -MyChart message sent 10/29/16 requesting abx; rx for Biaxin provided for acute sinusitis; symptoms worsened after visit two days ago.    Orders Placed This Encounter  Procedures  . MM DIGITAL SCREENING  BILATERAL    Standing Status:   Future    Standing Expiration Date:   12/27/2017    Order Specific Question:   Reason for Exam (SYMPTOM  OR DIAGNOSIS REQUIRED)    Answer:   screening for breast cancer    Order Specific Question:   Preferred imaging location?    Answer:   [facility]  . Tdap vaccine greater than or equal to 7yo IM  . Pneumococcal polysaccharide vaccine 23-valent greater than or equal to 2yo subcutaneous/IM  . Hemoglobin A1c  . Lipid panel    Order Specific Question:   Has the patient fasted?    Answer:   No  . TSH  . Comprehensive metabolic panel    Order Specific Question:   Has the patient fasted?    Answer:   No  . CBC with Differential/Platelet  . Microalbumin / creatinine urine ratio  . POCT urinalysis dipstick   Meds ordered this encounter  Medications  . linaclotide (LINZESS) 72 MCG capsule    Sig: Take 1 capsule (72 mcg total) by mouth daily before breakfast.    Dispense:  30 capsule    Refill:  5  . DULoxetine (CYMBALTA) 60 MG capsule    Sig: Take 1 capsule (60 mg total) by mouth 2 (two) times daily.    Dispense:  180 capsule    Refill:  1  . pantoprazole (PROTONIX) 40 MG tablet    Sig: Take 1 tablet (40 mg total) by mouth 2 (two) times daily.    Dispense:  180 tablet    Refill:  3  . Zoster Vac Recomb Adjuvanted [facility]) injection    Sig: Inject 0.5 mLs into the muscle once.    Dispense:  0.5 mL    Refill:  1  . phenazopyridine (PYRIDIUM) 200 MG tablet    Sig: Take 1 tablet (200 mg total) by mouth 3 (three) times daily as needed for pain.    Dispense:  30 tablet    Refill:  1    Return in about 6 months (around 04/27/2017) for follow-up chronic medical conditions.   Elyse Prevo Elayne Guerin, M.D. Primary Care at [facility] previously [facility] [address] [place]  [address] [phone] phone [phone] fax

## 2016-10-27 ENCOUNTER — Other Ambulatory Visit: Payer: Self-pay | Admitting: Family Medicine

## 2016-10-27 ENCOUNTER — Encounter: Payer: Self-pay | Admitting: Family Medicine

## 2016-10-27 ENCOUNTER — Ambulatory Visit (INDEPENDENT_AMBULATORY_CARE_PROVIDER_SITE_OTHER): Payer: 59 | Admitting: Family Medicine

## 2016-10-27 VITALS — BP 122/68 | HR 104 | Temp 98.0°F | Resp 16 | Ht 65.35 in | Wt 169.0 lb

## 2016-10-27 DIAGNOSIS — Z23 Encounter for immunization: Secondary | ICD-10-CM

## 2016-10-27 DIAGNOSIS — E7800 Pure hypercholesterolemia, unspecified: Secondary | ICD-10-CM

## 2016-10-27 DIAGNOSIS — Z1231 Encounter for screening mammogram for malignant neoplasm of breast: Secondary | ICD-10-CM | POA: Diagnosis not present

## 2016-10-27 DIAGNOSIS — E1169 Type 2 diabetes mellitus with other specified complication: Secondary | ICD-10-CM

## 2016-10-27 DIAGNOSIS — M5136 Other intervertebral disc degeneration, lumbar region: Secondary | ICD-10-CM

## 2016-10-27 DIAGNOSIS — J0101 Acute recurrent maxillary sinusitis: Secondary | ICD-10-CM

## 2016-10-27 DIAGNOSIS — K219 Gastro-esophageal reflux disease without esophagitis: Secondary | ICD-10-CM | POA: Diagnosis not present

## 2016-10-27 DIAGNOSIS — F418 Other specified anxiety disorders: Secondary | ICD-10-CM

## 2016-10-27 DIAGNOSIS — Z6827 Body mass index (BMI) 27.0-27.9, adult: Secondary | ICD-10-CM

## 2016-10-27 DIAGNOSIS — Z0000 Encounter for general adult medical examination without abnormal findings: Secondary | ICD-10-CM

## 2016-10-27 DIAGNOSIS — Z9884 Bariatric surgery status: Secondary | ICD-10-CM

## 2016-10-27 DIAGNOSIS — Z1239 Encounter for other screening for malignant neoplasm of breast: Secondary | ICD-10-CM

## 2016-10-27 DIAGNOSIS — E669 Obesity, unspecified: Secondary | ICD-10-CM

## 2016-10-27 DIAGNOSIS — M170 Bilateral primary osteoarthritis of knee: Secondary | ICD-10-CM

## 2016-10-27 DIAGNOSIS — G43009 Migraine without aura, not intractable, without status migrainosus: Secondary | ICD-10-CM

## 2016-10-27 DIAGNOSIS — I10 Essential (primary) hypertension: Secondary | ICD-10-CM

## 2016-10-27 LAB — POCT URINALYSIS DIP (MANUAL ENTRY)
BILIRUBIN UA: NEGATIVE
BILIRUBIN UA: NEGATIVE mg/dL
Glucose, UA: NEGATIVE mg/dL
Leukocytes, UA: NEGATIVE
NITRITE UA: NEGATIVE
PH UA: 5.5 (ref 5.0–8.0)
Protein Ur, POC: 30 mg/dL — AB
RBC UA: NEGATIVE
SPEC GRAV UA: 1.02 (ref 1.010–1.025)
Urobilinogen, UA: 0.2 E.U./dL

## 2016-10-27 MED ORDER — LINACLOTIDE 72 MCG PO CAPS: 72.0000 ug | ORAL_CAPSULE | Freq: Every day | ORAL | 5 refills | Status: DC

## 2016-10-27 MED ORDER — ZOSTER VAC RECOMB ADJUVANTED 50 MCG/0.5ML IM SUSR: 0.5000 mL | Freq: Once | INTRAMUSCULAR | 1 refills | Status: AC

## 2016-10-27 MED ORDER — PANTOPRAZOLE SODIUM 40 MG PO TBEC: 40.0000 mg | DELAYED_RELEASE_TABLET | Freq: Two times a day (BID) | ORAL | 3 refills | Status: DC

## 2016-10-27 MED ORDER — PHENAZOPYRIDINE HCL 200 MG PO TABS: 299.0000 mg | ORAL_TABLET | Freq: Three times a day (TID) | ORAL | 1 refills | Status: DC | PRN

## 2016-10-27 MED ORDER — DULOXETINE HCL 60 MG PO CPEP: 60.0000 mg | ORAL_CAPSULE | Freq: Two times a day (BID) | ORAL | 1 refills | Status: DC

## 2016-10-27 NOTE — Patient Instructions (Addendum)
We recommend that you schedule a mammogram for breast cancer screening. Typically, you do not need a referral to do this. Please contact a local imaging center to schedule your mammogram.  [facility] - [phone]  *ask for the Radiology Department [facility] - [phone] or [phone]  [facility] - [phone] [facility] - [phone] [facility] - [phone]  *ask for the Radiology Department [facility] - [phone]  *ask for the Radiology Department [facility] - [phone]  *ask for the Mammography Department [facility] - [phone]    IF you received an x-ray today, you will receive an invoice from [facility]. Please contact [facility] at [phone] with questions or concerns regarding your invoice.   IF you received labwork today, you will receive an invoice from [facility]. Please contact [facility] at [phone] with questions or concerns regarding your invoice.   Our billing staff will not be able to assist you with questions regarding bills from these companies.  You will be contacted with the lab results as soon as they are available. The fastest way to get your results is to activate your My Chart account. Instructions are located on the last page of this paperwork. If you have not heard from us regarding the results in 2 weeks, please contact this office.      Preventive Care 40-64 Years, Female Preventive care refers to lifestyle choices and visits with your health care provider that can promote health and wellness. What does preventive care include?  A yearly physical exam. This is also called an annual well check.  Dental exams once or twice a year.  Routine eye exams. Ask your health care provider how often you should have your eyes checked.  Personal lifestyle choices, including: ? Daily care of your teeth and  gums. ? Regular physical activity. ? Eating a healthy diet. ? Avoiding tobacco and drug use. ? Limiting alcohol use. ? Practicing safe sex. ? Taking low-dose aspirin daily starting at age 50. ? Taking vitamin and mineral supplements as recommended by your health care provider. What happens during an annual well check? The services and screenings done by your health care provider during your annual well check will depend on your age, overall health, lifestyle risk factors, and family history of disease. Counseling Your health care provider may ask you questions about your:  Alcohol use.  Tobacco use.  Drug use.  Emotional well-being.  Home and relationship well-being.  Sexual activity.  Eating habits.  Work and work environment.  Method of birth control.  Menstrual cycle.  Pregnancy history.  Screening You may have the following tests or measurements:  Height, weight, and BMI.  Blood pressure.  Lipid and cholesterol levels. These may be checked every 5 years, or more frequently if you are over 50 years old.  Skin check.  Lung cancer screening. You may have this screening every year starting at age 55 if you have a 30-pack-year history of smoking and currently smoke or have quit within the past 15 years.  Fecal occult blood test (FOBT) of the stool. You may have this test every year starting at age 50.  Flexible sigmoidoscopy or colonoscopy. You may have a sigmoidoscopy every 5 years or a colonoscopy every 10 years starting at age 50.  Hepatitis C blood test.  Hepatitis B blood test.  Sexually transmitted disease (STD) testing.  Diabetes screening. This is done by   checking your blood sugar (glucose) after you have not eaten for a while (fasting). You may have this done every 1-3 years.  Mammogram. This may be done every 1-2 years. Talk to your health care provider about when you should start having regular mammograms. This may depend on whether you have a  family history of breast cancer.  BRCA-related cancer screening. This may be done if you have a family history of breast, ovarian, tubal, or peritoneal cancers.  Pelvic exam and Pap test. This may be done every 3 years starting at age 21. Starting at age 30, this may be done every 5 years if you have a Pap test in combination with an HPV test.  Bone density scan. This is done to screen for osteoporosis. You may have this scan if you are at high risk for osteoporosis.  Discuss your test results, treatment options, and if necessary, the need for more tests with your health care provider. Vaccines Your health care provider may recommend certain vaccines, such as:  Influenza vaccine. This is recommended every year.  Tetanus, diphtheria, and acellular pertussis (Tdap, Td) vaccine. You may need a Td booster every 10 years.  Varicella vaccine. You may need this if you have not been vaccinated.  Zoster vaccine. You may need this after age 60.  Measles, mumps, and rubella (MMR) vaccine. You may need at least one dose of MMR if you were born in 1957 or later. You may also need a second dose.  Pneumococcal 13-valent conjugate (PCV13) vaccine. You may need this if you have certain conditions and were not previously vaccinated.  Pneumococcal polysaccharide (PPSV23) vaccine. You may need one or two doses if you smoke cigarettes or if you have certain conditions.  Meningococcal vaccine. You may need this if you have certain conditions.  Hepatitis A vaccine. You may need this if you have certain conditions or if you travel or work in places where you may be exposed to hepatitis A.  Hepatitis B vaccine. You may need this if you have certain conditions or if you travel or work in places where you may be exposed to hepatitis B.  Haemophilus influenzae type b (Hib) vaccine. You may need this if you have certain conditions.  Talk to your health care provider about which screenings and vaccines you need  and how often you need them. This information is not intended to replace advice given to you by your health care provider. Make sure you discuss any questions you have with your health care provider. Document Released: 02/07/2015 Document Revised: 10/01/2015 Document Reviewed: 11/12/2014 Elsevier Interactive Patient Education  2017 [employer]   

## 2016-10-27 NOTE — Progress Notes (Deleted)
Subjective:    Patient ID: Bethany Scott, female    DOB: 1955-06-27, 61 y.o.   MRN: [id number]  10/27/2016  No chief complaint on file.   HPI This 61 y.o. female presents for evaluation of ***. BP Readings from Last 3 Encounters:  08/20/16 118/67  04/27/16 111/70  03/03/16 (!) 157/80   Wt Readings from Last 3 Encounters:  06/16/16 187 lb 9.6 oz (85.1 kg)  04/28/16 201 lb 9.6 oz (91.4 kg)  04/27/16 202 lb 12.8 oz (92 kg)   Immunization History  Administered Date(s) Administered  . Hepatitis B, adult 01/02/2013, 04/04/2013, 09/17/2013  . Influenza Split 11/05/2011, 10/25/2012  . Influenza,inj,Quad PF,6+ Mos 10/28/2015  . Influenza-Unspecified 11/25/2013, 11/26/2014  . Pneumococcal Polysaccharide-23 07/07/2010  . Tdap 11/23/2006    Review of Systems  Past Medical History:  Diagnosis Date  . Allergy   . Anemia   . Anxiety   . Arthritis    OA Knee; non-specific autoimmune process followed by [facility]Rheumatology.  . Asthma   . Blood transfusion without reported diagnosis YRS AGO  . Depression   . Diabetes mellitus without complication ([place])   . Gastric ulcer, unspecified as acute or chronic, without mention of hemorrhage, perforation, or obstruction YRS AGO  . GERD (gastroesophageal reflux disease)   . Hyperlipidemia   . Hypertension   . Migraine    hx of migraines  . Obesity, unspecified   . Other chronic cystitis   . Personal history of colonic polyps   . PONV (postoperative nausea and vomiting)    NAUSEA, SCOPOLAMINE PATCH HELPED WITH LAST SURGERIES  . Pre-diabetes    Past Surgical History:  Procedure Laterality Date  . ABDOMINAL HYSTERECTOMY  01/25/1994   uterine fibroids; DUB; cervical dysplasia; ovaries resected two years later.    . APPENDECTOMY    . BILATERAL OOPHORECTOMY  2001  . CESAREAN SECTION     x 2  . COLON SURGERY  YRS AGO   SIGMOIDECTOMY  . Colonoscopy  09/08/2012   diverticulosis, hemorrhoids.  [name].  Symptoms:  anemia,  hemoccult +.  . ESOPHAGOGASTRODUODENOSCOPY  09/08/2012   +HH.  [name].  Symptoms: anemia, hemoccult +.  [name] LAPAROSCOPIC GASTRIC SLEEVE RESECTION N/A 03/01/2016   Procedure: LAPAROSCOPIC GASTRIC SLEEVE RESECTION, UPPER ENDO;  Surgeon: [name], MD;  Location: [facility];  Service: General;  Laterality: N/A;  . LAPAROSCOPIC LYSIS OF ADHESIONS N/A 02/10/2016   Procedure: LAPAROSCOPIC LYSIS OF ADHESIONS;  Surgeon: [name], MD;  Location: [facility];  Service: General;  Laterality: N/A;  . LAPAROSCOPY N/A 02/10/2016   Procedure: LAPAROSCOPY DIAGNOSTIC;  Surgeon: [name], MD;  Location: [facility];  Service: General;  Laterality: N/A;  . NASAL SEPTUM SURGERY    . NECK SURGERY    . Rheumatology Consult  11/26/2011   multiple arthralgias. Autoimmune labs negative.  Rx for Plaquenil for non-specific autoimmune process.  [name].  [name] SIGMOID RESECTION / RECTOPEXY    . SPINE SURGERY    . TONSILLECTOMY AND ADENOIDECTOMY     Allergies  Allergen Reactions  . Cephalosporins Anaphylaxis  . Penicillins Anaphylaxis    Has patient had a PCN reaction causing immediate rash, facial/tongue/throat swelling, SOB or lightheadedness with hypotension: Yes Has patient had a PCN reaction causing severe rash involving mucus membranes or skin necrosis: No Has patient had a PCN reaction that required hospitalization Yes Has patient had a PCN reaction occurring within the last 10 years: No If all of the above answers are "NO", then may  proceed with Cephalosporin use.    Current Outpatient Prescriptions  Medication Sig Dispense Refill  . atorvastatin (LIPITOR) 20 MG tablet Take 1 tablet (20 mg total) by mouth daily at 6 PM. 90 tablet 1  . beclomethasone (QVAR) 80 MCG/ACT inhaler Inhale 2 puffs into the lungs 2 (two) times daily. 1 Inhaler 12  . Blood Glucose Monitoring Suppl KIT 1 each by Does not apply route as needed. Please dispense the monitor and supplies compliant with pt insurance ONETOUCH 100 each 11  .  cetirizine (ZYRTEC) 10 MG tablet Take 10 mg by mouth daily.    . Cholecalciferol (VITAMIN D3) 5000 units CAPS Take 5,000 Units by mouth daily.    . ciprofloxacin (CIPRO) 500 MG tablet Take 1 tablet (500 mg total) by mouth 2 (two) times daily. 10 tablet 0  . conjugated estrogens (PREMARIN) vaginal cream Place 1 Applicatorful vaginally at bedtime. 42.5 g 12  . diclofenac (FLECTOR) 1.3 % PTCH Apply one patch or a portion of patch to painful area of skin twice per day if tolerated (Patient taking differently: Place 1 patch onto the skin 2 (two) times daily as needed (pain). Apply one patch or a portion of patch to painful area of skin twice per day if tolerated) 60 patch 0  . doxycycline (VIBRA-TABS) 100 MG tablet Take 1 tablet (100 mg total) by mouth 2 (two) times daily. 20 tablet 0  . DULoxetine (CYMBALTA) 60 MG capsule Take 60 mg by mouth 2 (two) times daily.    . DULoxetine (CYMBALTA) 60 MG capsule TAKE 1 CAPSULE (60 MG TOTAL) BY MOUTH 2 (TWO) TIMES DAILY. 180 capsule 1  . HYDROcodone-acetaminophen (NORCO/VICODIN) 5-325 MG tablet Take 1 tablet by mouth every 6 (six) hours as needed for moderate pain. 30 tablet 0  . hydrocortisone (ANUSOL-HC) 25 MG suppository Place 1 suppository (25 mg total) rectally 2 (two) times daily as needed for hemorrhoids. 12 suppository 2  . IRON PO Take 1 tablet by mouth 3 (three) times daily.    [name] levofloxacin (LEVAQUIN) 500 MG tablet Take 1 tablet (500 mg total) by mouth daily. 10 tablet 0  . levofloxacin (LEVAQUIN) 750 MG tablet Take 1 tablet (750 mg total) by mouth daily. 10 tablet 0  . lisinopril (PRINIVIL,ZESTRIL) 5 MG tablet Take 1 tablet (5 mg total) by mouth daily. 90 tablet 3  . LYSINE PO Take 1 tablet by mouth daily.    . methylPREDNISolone (MEDROL DOSEPAK) 4 MG TBPK tablet Take 6-5-4-3-2-1 po qd 21 tablet 0  . montelukast (SINGULAIR) 10 MG tablet TAKE 1 TABLET BY MOUTH DAILY. 90 tablet 1  . nitrofurantoin, macrocrystal-monohydrate, (MACROBID) 100 MG capsule  TAKE 1 CAPSULE (100 MG TOTAL) BY MOUTH 2 (TWO) TIMES DAILY AS NEEDED. 30 capsule 0  . pantoprazole (PROTONIX) 40 MG tablet Take 1 tablet (40 mg total) by mouth 2 (two) times daily. 180 tablet 3  . phenazopyridine (PYRIDIUM) 100 MG tablet Take 1-2 tablets (100-200 mg total) by mouth 3 (three) times daily as needed for pain. 12 tablet 0  . promethazine (PHENERGAN) 25 MG tablet Take 1 tablet (25 mg total) by mouth every 8 (eight) hours as needed for nausea or vomiting. 20 tablet 0  . rizatriptan (MAXALT) 10 MG tablet Take 10 mg by mouth as needed. May repeat in 2 hours if needed     No current facility-administered medications for this visit.    Social History   Social History  . Marital status: Married    Spouse name: N/A  .  Number of children: 2  . Years of education: 12   Occupational History  . 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 Smoking status: Former Smoker    Packs/day: 1.00    Years: 5.00  . Smokeless tobacco: Never Used     Comment: Quit 26 years ago  . Alcohol use Yes     Comment: rare  . Drug use: No  . Sexual activity: Yes    Birth control/ protection: Post-menopausal, Surgical   Other Topics Concern  . Not on file   Social History Narrative   Marital status: married since 4 years; happily married; no abuse.      Children: 2 sons (61, 72); 2 grandchildren  (22yo, 68 months)      Lives: with husband, [name]/son.      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      Tobacco abuse:  Former smoker.      Alcohol:  None; holidays only      Exercise: none in 2017     Seatbelt: 100%; no texting   Family History  Problem Relation Age of Onset  . Bipolar disorder Father   . Transient ischemic attack Father   . Dementia Father        secondary to head trauma  . Hypertension Father   . Diabetes Father   . CAD Father   . Cancer Mother 63       lung  . Migraines  Mother   . Hypothyroidism Mother   . Hypertension Sister   . Atrial fibrillation Sister   . Hypothyroidism Sister   . Alcohol abuse Brother   . Hypertension Brother   . Alcohol abuse Brother   . Atrial fibrillation Maternal Grandmother   . COPD Maternal Grandmother   . Diabetes Maternal Grandmother   . Hyperlipidemia Maternal Grandmother   . Diabetes Maternal Grandfather   . Diabetes Paternal Grandfather   . Heart disease Paternal Grandfather        Objective:    There were no vitals taken for this visit. Physical Exam Results for orders placed or performed in visit on 04/27/16  Culture, Group A Strep  Result Value Ref Range   Strep A Culture Negative   CBC with Differential/Platelet  Result Value Ref Range   WBC 6.0 3.4 - 10.8 x10E3/uL   RBC 4.67 3.77 - 5.28 x10E6/uL   Hemoglobin 13.9 11.1 - 15.9 g/dL   Hematocrit 41.9 34.0 - 46.6 %   MCV 90 79 - 97 fL   MCH 29.8 26.6 - 33.0 pg   MCHC 33.2 31.5 - 35.7 g/dL   RDW 14.6 12.3 - 15.4 %   Platelets 273 150 - 379 x10E3/uL   Neutrophils 51 Not Estab. %   Lymphs 39 Not Estab. %   Monocytes 7 Not Estab. %   Eos 3 Not Estab. %   Basos 0 Not Estab. %   Neutrophils Absolute 3.0 1.4 - 7.0 x10E3/uL   Lymphocytes Absolute 2.3 0.7 - 3.1 x10E3/uL   Monocytes Absolute 0.4 0.1 - 0.9 x10E3/uL   EOS (ABSOLUTE) 0.2 0.0 - 0.4 x10E3/uL   Basophils Absolute 0.0 0.0 - 0.2 x10E3/uL   Immature Granulocytes 0 Not Estab. %   Immature Grans (Abs) 0.0 0.0 - 0.1 x10E3/uL  Comprehensive metabolic panel  Result Value Ref Range   Glucose 81 65 - 99 mg/dL   BUN  13 8 - 27 mg/dL   Creatinine, Ser 0.52 (L) 0.57 - 1.00 mg/dL   GFR calc non Af Amer 104 >59 mL/min/1.73   GFR calc Af Amer 120 >59 mL/min/1.73   BUN/Creatinine Ratio 25 12 - 28   Sodium 140 134 - 144 mmol/L   Potassium 4.0 3.5 - 5.2 mmol/L   Chloride 98 96 - 106 mmol/L   CO2 26 18 - 29 mmol/L   Calcium 10.1 8.7 - 10.3 mg/dL   Total Protein 6.7 6.0 - 8.5 g/dL   Albumin 4.5 3.6 - 4.8  g/dL   Globulin, Total 2.2 1.5 - 4.5 g/dL   Albumin/Globulin Ratio 2.0 1.2 - 2.2   Bilirubin Total 0.4 0.0 - 1.2 mg/dL   Alkaline Phosphatase 65 39 - 117 IU/L   AST 17 0 - 40 IU/L   ALT 24 0 - 32 IU/L  Lipid panel  Result Value Ref Range   Cholesterol, Total 169 100 - 199 mg/dL   Triglycerides 106 0 - 149 mg/dL   HDL 43 >39 mg/dL   VLDL Cholesterol Cal 21 5 - 40 mg/dL   LDL Calculated 105 (H) 0 - 99 mg/dL   Chol/HDL Ratio 3.9 0.0 - 4.4 ratio  Vitamin B12  Result Value Ref Range   Vitamin B-12 >2000 (H) 232 - 1245 pg/mL  VITAMIN D 25 Hydroxy (Vit-D Deficiency, Fractures)  Result Value Ref Range   Vit D, 25-Hydroxy 51.6 30.0 - 100.0 ng/mL  Iron and TIBC  Result Value Ref Range   Total Iron Binding Capacity 264 250 - 450 ug/dL   UIBC 222 131 - 425 ug/dL   Iron 42 27 - 159 ug/dL   Iron Saturation 16 15 - 55 %  POCT urinalysis dipstick  Result Value Ref Range   Color, UA yellow yellow   Clarity, UA clear clear   Glucose, UA negative negative   Bilirubin, UA negative negative   Ketones, POC UA negative negative   Spec Grav, UA 1.005 1.030 - 1.035   Blood, UA trace-intact (A) negative   pH, UA 6.0 5.0 - 8.0   Protein Ur, POC negative negative   Urobilinogen, UA 0.2 Negative - 2.0   Nitrite, UA Negative Negative   Leukocytes, UA moderate (2+) (A) Negative  POCT rapid strep A  Result Value Ref Range   Rapid Strep A Screen Negative Negative   No results found. Depression screen [facility] 2/9 04/27/2016 02/04/2016 10/28/2015 10/06/2015 09/10/2015  Decreased Interest 0 0 0 0 0  Down, Depressed, Hopeless 0 0 0 0 0  PHQ - 2 Score 0 0 0 0 0   Fall Risk  04/27/2016 02/04/2016 10/28/2015 10/06/2015 09/10/2015  Falls in the past year? _0   Comment - - - - -  Number falls in past yr: - - - - -  Injury with Fall? - - - - -  Comment - - - - -  Risk for fall due to : - - - - -  Follow up - - - - -        Assessment & Plan:   1. Essential hypertension, benign   2. Diabetes  mellitus type 2 in obese ([place])   3. DDD (degenerative disc disease), lumbar   4. Depression with anxiety   5. Migraine without aura and without status migrainosus, not intractable     No orders of the defined types were placed in this encounter.  No orders of the defined types were  placed in this encounter.   No Follow-up on file.   Laelyn Blumenthal Elayne Guerin, M.D. [facility] [address] [place], [place]  [address] [phone] phone [phone] fax

## 2016-10-28 ENCOUNTER — Encounter: Payer: Self-pay | Admitting: Family Medicine

## 2016-10-28 LAB — MICROALBUMIN / CREATININE URINE RATIO
Creatinine, Urine: 186.9 mg/dL
MICROALB/CREAT RATIO: 24 mg/g{creat} (ref 0.0–30.0)
MICROALBUM., U, RANDOM: 44.8 ug/mL

## 2016-10-28 LAB — CBC WITH DIFFERENTIAL/PLATELET
Basophils Absolute: 0 10*3/uL (ref 0.0–0.2)
Basos: 1 %
EOS (ABSOLUTE): 0.2 10*3/uL (ref 0.0–0.4)
Eos: 4 %
Hematocrit: 43.3 % (ref 34.0–46.6)
Hemoglobin: 14 g/dL (ref 11.1–15.9)
Immature Grans (Abs): 0 10*3/uL (ref 0.0–0.1)
Immature Granulocytes: 0 %
Lymphocytes Absolute: 1.7 10*3/uL (ref 0.7–3.1)
Lymphs: 29 %
MCH: 31 pg (ref 26.6–33.0)
MCHC: 32.3 g/dL (ref 31.5–35.7)
MCV: 96 fL (ref 79–97)
Monocytes Absolute: 0.4 10*3/uL (ref 0.1–0.9)
Monocytes: 6 %
Neutrophils Absolute: 3.5 10*3/uL (ref 1.4–7.0)
Neutrophils: 60 %
Platelets: 267 10*3/uL (ref 150–379)
RBC: 4.51 x10E6/uL (ref 3.77–5.28)
RDW: 12.9 % (ref 12.3–15.4)
WBC: 5.8 10*3/uL (ref 3.4–10.8)

## 2016-10-28 LAB — COMPREHENSIVE METABOLIC PANEL
A/G RATIO: 1.9 (ref 1.2–2.2)
ALT: 16 IU/L (ref 0–32)
AST: 20 IU/L (ref 0–40)
Albumin: 4.4 g/dL (ref 3.6–4.8)
Alkaline Phosphatase: 69 IU/L (ref 39–117)
BILIRUBIN TOTAL: 0.2 mg/dL (ref 0.0–1.2)
BUN/Creatinine Ratio: 25 (ref 12–28)
BUN: 14 mg/dL (ref 8–27)
CHLORIDE: 102 mmol/L (ref 96–106)
CO2: 25 mmol/L (ref 20–29)
Calcium: 9.6 mg/dL (ref 8.7–10.3)
Creatinine, Ser: 0.55 mg/dL — ABNORMAL LOW (ref 0.57–1.00)
GFR calc non Af Amer: 102 mL/min/{1.73_m2} (ref >59)
GFR, EST AFRICAN AMERICAN: 117 mL/min/{1.73_m2} (ref >59)
Globulin, Total: 2.3 g/dL (ref 1.5–4.5)
Glucose: 89 mg/dL (ref 65–99)
POTASSIUM: 4.2 mmol/L (ref 3.5–5.2)
Sodium: 141 mmol/L (ref 134–144)
TOTAL PROTEIN: 6.7 g/dL (ref 6.0–8.5)

## 2016-10-28 LAB — LIPID PANEL
Chol/HDL Ratio: 3.3 ratio (ref 0.0–4.4)
Cholesterol, Total: 168 mg/dL (ref 100–199)
HDL: 51 mg/dL
LDL Calculated: 85 mg/dL (ref 0–99)
Triglycerides: 161 mg/dL — ABNORMAL HIGH (ref 0–149)
VLDL Cholesterol Cal: 32 mg/dL (ref 5–40)

## 2016-10-28 LAB — HEMOGLOBIN A1C
Est. average glucose Bld gHb Est-mCnc: 82 mg/dL
Hgb A1c MFr Bld: 4.5 % — ABNORMAL LOW (ref 4.8–5.6)

## 2016-10-28 LAB — TSH: TSH: 0.743 u[IU]/mL (ref 0.450–4.500)

## 2016-10-28 MED ORDER — PROMETHAZINE HCL 25 MG PO TABS: 25.0000 mg | ORAL_TABLET | Freq: Three times a day (TID) | ORAL | 0 refills | Status: DC | PRN

## 2016-10-28 MED ORDER — NITROFURANTOIN MONOHYD MACRO 100 MG PO CAPS: ORAL_CAPSULE | ORAL | 0 refills | Status: DC

## 2016-10-28 NOTE — Telephone Encounter (Signed)
Refill req Phenergan Sent  To [facility]

## 2016-10-29 ENCOUNTER — Encounter: Payer: Self-pay | Admitting: Family Medicine

## 2016-10-29 MED ORDER — CLARITHROMYCIN 250 MG PO TABS: 250.0000 mg | ORAL_TABLET | Freq: Two times a day (BID) | ORAL | 0 refills | Status: DC

## 2016-11-02 ENCOUNTER — Encounter: Payer: Self-pay | Admitting: Family Medicine

## 2016-11-08 ENCOUNTER — Encounter: Payer: Self-pay | Admitting: Family Medicine

## 2016-11-10 ENCOUNTER — Encounter: Payer: Self-pay | Admitting: Family Medicine

## 2016-11-10 DIAGNOSIS — M25561 Pain in right knee: Secondary | ICD-10-CM

## 2016-11-10 DIAGNOSIS — M25562 Pain in left knee: Principal | ICD-10-CM

## 2016-11-10 DIAGNOSIS — I83893 Varicose veins of bilateral lower extremities with other complications: Secondary | ICD-10-CM

## 2016-11-16 DIAGNOSIS — Z5181 Encounter for therapeutic drug level monitoring: Secondary | ICD-10-CM | POA: Diagnosis not present

## 2016-11-16 DIAGNOSIS — M5136 Other intervertebral disc degeneration, lumbar region: Secondary | ICD-10-CM | POA: Diagnosis not present

## 2016-11-16 DIAGNOSIS — M7060 Trochanteric bursitis, unspecified hip: Secondary | ICD-10-CM | POA: Diagnosis not present

## 2016-11-16 DIAGNOSIS — M259 Joint disorder, unspecified: Secondary | ICD-10-CM | POA: Diagnosis not present

## 2016-11-29 ENCOUNTER — Encounter: Payer: Self-pay | Admitting: Family Medicine

## 2016-11-30 ENCOUNTER — Encounter: Payer: Self-pay | Admitting: Family Medicine

## 2016-11-30 NOTE — Telephone Encounter (Signed)
Please check the status of the doppler ordered on 11/16/16.  Patient has not been contacted to schedule this doppler.

## 2016-12-02 ENCOUNTER — Other Ambulatory Visit: Payer: Self-pay | Admitting: Family Medicine

## 2016-12-02 DIAGNOSIS — J301 Allergic rhinitis due to pollen: Secondary | ICD-10-CM

## 2016-12-03 ENCOUNTER — Other Ambulatory Visit: Payer: Self-pay | Admitting: Family Medicine

## 2016-12-03 ENCOUNTER — Ambulatory Visit (INDEPENDENT_AMBULATORY_CARE_PROVIDER_SITE_OTHER): Payer: 59

## 2016-12-03 DIAGNOSIS — M25562 Pain in left knee: Principal | ICD-10-CM

## 2016-12-03 DIAGNOSIS — M79604 Pain in right leg: Secondary | ICD-10-CM

## 2016-12-03 DIAGNOSIS — M25561 Pain in right knee: Secondary | ICD-10-CM

## 2016-12-03 DIAGNOSIS — M7989 Other specified soft tissue disorders: Secondary | ICD-10-CM | POA: Diagnosis not present

## 2016-12-03 DIAGNOSIS — M79605 Pain in left leg: Secondary | ICD-10-CM

## 2016-12-03 NOTE — Telephone Encounter (Signed)
Thanks so much for this update and follow-up.

## 2016-12-05 ENCOUNTER — Encounter: Payer: Self-pay | Admitting: Family Medicine

## 2016-12-10 DIAGNOSIS — Z9884 Bariatric surgery status: Secondary | ICD-10-CM | POA: Diagnosis not present

## 2016-12-10 DIAGNOSIS — E785 Hyperlipidemia, unspecified: Secondary | ICD-10-CM | POA: Diagnosis not present

## 2016-12-10 DIAGNOSIS — E1169 Type 2 diabetes mellitus with other specified complication: Secondary | ICD-10-CM | POA: Diagnosis not present

## 2016-12-10 DIAGNOSIS — N369 Urethral disorder, unspecified: Secondary | ICD-10-CM | POA: Diagnosis not present

## 2016-12-10 DIAGNOSIS — K219 Gastro-esophageal reflux disease without esophagitis: Secondary | ICD-10-CM | POA: Diagnosis not present

## 2016-12-10 DIAGNOSIS — I10 Essential (primary) hypertension: Secondary | ICD-10-CM | POA: Diagnosis not present

## 2016-12-10 DIAGNOSIS — E669 Obesity, unspecified: Secondary | ICD-10-CM | POA: Diagnosis not present

## 2016-12-10 DIAGNOSIS — E663 Overweight: Secondary | ICD-10-CM | POA: Diagnosis not present

## 2016-12-10 MED ORDER — PHENAZOPYRIDINE HCL 200 MG PO TABS: 299.0000 mg | ORAL_TABLET | Freq: Three times a day (TID) | ORAL | 1 refills | Status: DC | PRN

## 2016-12-10 MED ORDER — NITROFURANTOIN MONOHYD MACRO 100 MG PO CAPS: ORAL_CAPSULE | ORAL | 0 refills | Status: DC

## 2016-12-14 DIAGNOSIS — Z5181 Encounter for therapeutic drug level monitoring: Secondary | ICD-10-CM | POA: Diagnosis not present

## 2016-12-14 DIAGNOSIS — M259 Joint disorder, unspecified: Secondary | ICD-10-CM | POA: Diagnosis not present

## 2016-12-14 DIAGNOSIS — M7060 Trochanteric bursitis, unspecified hip: Secondary | ICD-10-CM | POA: Diagnosis not present

## 2016-12-14 DIAGNOSIS — G5601 Carpal tunnel syndrome, right upper limb: Secondary | ICD-10-CM | POA: Diagnosis not present

## 2016-12-22 ENCOUNTER — Ambulatory Visit: Payer: 59 | Admitting: Family Medicine

## 2016-12-23 DIAGNOSIS — M7060 Trochanteric bursitis, unspecified hip: Secondary | ICD-10-CM | POA: Diagnosis not present

## 2016-12-23 DIAGNOSIS — M259 Joint disorder, unspecified: Secondary | ICD-10-CM | POA: Diagnosis not present

## 2016-12-23 DIAGNOSIS — Z5181 Encounter for therapeutic drug level monitoring: Secondary | ICD-10-CM | POA: Diagnosis not present

## 2016-12-23 DIAGNOSIS — G5601 Carpal tunnel syndrome, right upper limb: Secondary | ICD-10-CM | POA: Diagnosis not present

## 2016-12-24 ENCOUNTER — Ambulatory Visit
Admission: RE | Admit: 2016-12-24 | Discharge: 2016-12-24 | Disposition: A | Discharge status: Home / Self Care | Payer: 59 | Source: Ambulatory Visit | Attending: Family Medicine | Admitting: Family Medicine

## 2016-12-24 DIAGNOSIS — Z1231 Encounter for screening mammogram for malignant neoplasm of breast: Secondary | ICD-10-CM | POA: Diagnosis not present

## 2016-12-24 DIAGNOSIS — Z1239 Encounter for other screening for malignant neoplasm of breast: Secondary | ICD-10-CM

## 2016-12-31 ENCOUNTER — Encounter (INDEPENDENT_AMBULATORY_CARE_PROVIDER_SITE_OTHER): Payer: Self-pay | Admitting: Vascular Surgery

## 2016-12-31 ENCOUNTER — Ambulatory Visit (INDEPENDENT_AMBULATORY_CARE_PROVIDER_SITE_OTHER): Payer: 59 | Admitting: Vascular Surgery

## 2016-12-31 VITALS — BP 140/78 | HR 70 | Resp 15 | Ht 67.0 in | Wt 175.0 lb

## 2016-12-31 DIAGNOSIS — I83811 Varicose veins of right lower extremities with pain: Secondary | ICD-10-CM

## 2016-12-31 DIAGNOSIS — I10 Essential (primary) hypertension: Secondary | ICD-10-CM | POA: Diagnosis not present

## 2016-12-31 NOTE — Patient Instructions (Signed)
Varicose Veins Varicose veins are veins that have become enlarged and twisted. They are usually seen in the legs but can occur in other parts of the body as well. What are the causes? This condition is the result of valves in the veins not working properly. Valves in the veins help to return blood from the leg to the heart. If these valves are damaged, blood flows backward and backs up into the veins in the leg near the skin. This causes the veins to become larger. What increases the risk? People who are on their feet a lot, who are pregnant, or who are overweight are more likely to develop varicose veins. What are the signs or symptoms?  Bulging, twisted-appearing, bluish veins, most commonly found on the legs.  Leg pain or a feeling of heaviness. These symptoms may be worse at the end of the day.  Leg swelling.  Changes in skin color. How is this diagnosed? A health care provider can usually diagnose varicose veins by examining your legs. Your health care provider may also recommend an ultrasound of your leg veins. How is this treated? Most varicose veins can be treated at home.However, other treatments are available for people who have persistent symptoms or want to improve the cosmetic appearance of the varicose veins. These treatment options include:  Sclerotherapy. A solution is injected into the vein to close it off.  Laser treatment. A laser is used to heat the vein to close it off.  Radiofrequency vein ablation. An electrical current produced by radio waves is used to close off the vein.  Phlebectomy. The vein is surgically removed through small incisions made over the varicose vein.  Vein ligation and stripping. The vein is surgically removed through incisions made over the varicose vein after the vein has been tied (ligated). Follow these instructions at home:   Do not stand or sit in one position for long periods of time. Do not sit with your legs crossed. Rest with your  legs raised during the day.  Wear compression stockings as directed by your health care provider. These stockings help to prevent blood clots and reduce swelling in your legs.  Do not wear other tight, encircling garments around your legs, pelvis, or waist.  Walk as much as possible to increase blood flow.  Raise the foot of your bed at night with 2-inch blocks.  If you get a cut in the skin over the vein and the vein bleeds, lie down with your leg raised and press on it with a clean cloth until the bleeding stops. Then place a bandage (dressing) on the cut. See your health care provider if it continues to bleed. Contact a health care provider if:  The skin around your ankle starts to break down.  You have pain, redness, tenderness, or hard swelling in your leg over a vein.  You are uncomfortable because of leg pain. This information is not intended to replace advice given to you by your health care provider. Make sure you discuss any questions you have with your health care provider. Document Released: 10/21/2004 Document Revised: 06/19/2015 Document Reviewed: 07/15/2015 Elsevier Interactive Patient Education  2017 [employer]  

## 2016-12-31 NOTE — Progress Notes (Signed)
Patient ID: Bethany Scott, female   DOB: 05-06-1955, 61 y.o.   MRN: [id number]  Chief Complaint  Patient presents with  . New Patient (Initial Visit)    Varicose Veins    HPI Bethany Scott is a 61 y.o. female.  I am asked to see the patient by Dr. Tamala Julian for evaluation of painful varicose veins of the right leg.  The patient presents with complaints of symptomatic varicosities of the right medial leg. The patient reports a long standing history of varicosities and they have become painful over time.  She was seen here in our office several years ago for varicose veins but her symptoms were not as severe then.  She has been wearing 20-30 mmHg compression stockings on a daily basis since that time.  There was no clear inciting event or causative factor that started the symptoms.  The right leg is more severly affected. The patient elevates the legs for relief. The pain is described as stinging and burning discomfort particularly overlying the varicosities and affecting the lower leg. The symptoms are generally most severe in the evening, particularly when they have been on their feet for long periods of time.  Compression stockings has been used to try to improve the symptoms with some success. The patient complains of right leg swelling as an associated symptom. The patient has no previous history of deep venous thrombosis or superficial thrombophlebitis to their knowledge.     Past Medical History:  Diagnosis Date  . Allergy   . Anemia   . Anxiety   . Arthritis    OA Knee; non-specific autoimmune process followed by [facility]/Rheumatology.  . Asthma   . Blood transfusion without reported diagnosis YRS AGO  . Depression   . Diabetes mellitus without complication ([employer])   . Gastric ulcer, unspecified as acute or chronic, without mention of hemorrhage, perforation, or obstruction YRS AGO  . GERD (gastroesophageal reflux disease)   . Hyperlipidemia   . Hypertension   . Migraine    hx of migraines  . Obesity, unspecified   . Other chronic cystitis   . Personal history of colonic polyps   . PONV (postoperative nausea and vomiting)    NAUSEA, SCOPOLAMINE PATCH HELPED WITH LAST SURGERIES  . Pre-diabetes     Past Surgical History:  Procedure Laterality Date  . ABDOMINAL HYSTERECTOMY  01/25/1994   uterine fibroids; DUB; cervical dysplasia; ovaries resected two years later.    . APPENDECTOMY    . BILATERAL OOPHORECTOMY  2001  . CESAREAN SECTION     x 2  . COLON SURGERY  YRS AGO   SIGMOIDECTOMY  . Colonoscopy  09/08/2012   diverticulosis, hemorrhoids.  [name].  Symptoms:  anemia, hemoccult +.  . ESOPHAGOGASTRODUODENOSCOPY  09/08/2012   +HH.  [name].  Symptoms: anemia, hemoccult +.  [name] LAPAROSCOPIC GASTRIC SLEEVE RESECTION N/A 03/01/2016   Procedure: LAPAROSCOPIC GASTRIC SLEEVE RESECTION, UPPER ENDO;  Surgeon: [name], MD;  Location: [facility];  Service: General;  Laterality: N/A;  . LAPAROSCOPIC LYSIS OF ADHESIONS N/A 02/10/2016   Procedure: LAPAROSCOPIC LYSIS OF ADHESIONS;  Surgeon: [name], MD;  Location: [facility];  Service: General;  Laterality: N/A;  . LAPAROSCOPY N/A 02/10/2016   Procedure: LAPAROSCOPY DIAGNOSTIC;  Surgeon: [name], MD;  Location: [facility];  Service: General;  Laterality: N/A;  . NASAL SEPTUM SURGERY    . NECK SURGERY    . Rheumatology Consult  11/26/2011   multiple arthralgias. Autoimmune labs negative.  Rx  for Plaquenil for non-specific autoimmune process.  [name].  [name] SIGMOID RESECTION / RECTOPEXY    . SPINE SURGERY    . TONSILLECTOMY AND ADENOIDECTOMY      Family History  Problem Relation Age of Onset  . Bipolar disorder Father   . Transient ischemic attack Father   . Dementia Father        secondary to head trauma  . Hypertension Father   . Diabetes Father   . CAD Father   . Cancer Mother 36       lung  . Migraines Mother   . Hypothyroidism Mother   . Hypertension Sister   . Atrial fibrillation Sister   .  Hypothyroidism Sister   . Alcohol abuse Brother   . Hypertension Brother   . Alcohol abuse Brother   . Atrial fibrillation Maternal Grandmother   . COPD Maternal Grandmother   . Diabetes Maternal Grandmother   . Hyperlipidemia Maternal Grandmother   . Diabetes Maternal Grandfather   . Diabetes Paternal Grandfather   . Heart disease Paternal Grandfather      Social History Social History   Tobacco Use  . Smoking status: Former Smoker    Packs/day: 1.00    Years: 5.00    Pack years: 5.00  . Smokeless tobacco: Never Used  . Tobacco comment: Quit 26 years ago  Substance Use Topics  . Alcohol use: Yes    Comment: rare  . Drug use: No     Allergies  Allergen Reactions  . Cephalosporins Anaphylaxis  . Penicillins Anaphylaxis    Has patient had a PCN reaction causing immediate rash, facial/tongue/throat swelling, SOB or lightheadedness with hypotension: Yes Has patient had a PCN reaction causing severe rash involving mucus membranes or skin necrosis: No Has patient had a PCN reaction that required hospitalization Yes Has patient had a PCN reaction occurring within the last 10 years: No If all of the above answers are "NO", then may proceed with Cephalosporin use.     Current Outpatient Medications  Medication Sig Dispense Refill  . beclomethasone (QVAR) 80 MCG/ACT inhaler Inhale 2 puffs into the lungs 2 (two) times daily. 1 Inhaler 12  . Betamethasone Valerate 0.12 % foam   3  . Blood Glucose Monitoring Suppl KIT 1 each by Does not apply route as needed. Please dispense the monitor and supplies compliant with pt insurance ONETOUCH 100 each 11  . cetirizine (ZYRTEC) 10 MG tablet Take 10 mg by mouth daily.    . Cholecalciferol (VITAMIN D3) 5000 units CAPS Take 5,000 Units by mouth daily.    . clarithromycin (BIAXIN) 250 MG tablet Take 1 tablet (250 mg total) by mouth 2 (two) times daily. 20 tablet 0  . conjugated estrogens (PREMARIN) vaginal cream Place 1 Applicatorful  vaginally at bedtime. 42.5 g 12  . diclofenac (FLECTOR) 1.3 % PTCH Apply one patch or a portion of patch to painful area of skin twice per day if tolerated (Patient taking differently: Place 1 patch onto the skin 2 (two) times daily as needed (pain). Apply one patch or a portion of patch to painful area of skin twice per day if tolerated) 60 patch 0  . diclofenac sodium (VOLTAREN) 1 % GEL   0  . DULoxetine (CYMBALTA) 60 MG capsule Take 1 capsule (60 mg total) by mouth 2 (two) times daily. 180 capsule 1  . finasteride (PROPECIA) 1 MG tablet   0  . HYDROcodone-acetaminophen (NORCO) 10-325 MG tablet   0  . HYDROcodone-acetaminophen (  NORCO) 7.5-325 MG tablet   0  . HYDROcodone-acetaminophen (NORCO/VICODIN) 5-325 MG tablet Take 1 tablet by mouth every 6 (six) hours as needed for moderate pain. 30 tablet 0  . hydrocortisone (ANUSOL-HC) 25 MG suppository Place 1 suppository (25 mg total) rectally 2 (two) times daily as needed for hemorrhoids. 12 suppository 2  . IRON PO Take 1 tablet by mouth 3 (three) times daily.    [name] linaclotide (LINZESS) 72 MCG capsule Take 1 capsule (72 mcg total) by mouth daily before breakfast. 30 capsule 5  . lisinopril (PRINIVIL,ZESTRIL) 5 MG tablet Take by mouth.    . LYSINE PO Take 1 tablet by mouth daily.    . meloxicam (MOBIC) 15 MG tablet TAKE 1 TABLET (15 MG TOTAL) BY MOUTH ONCE DAILY.    . montelukast (SINGULAIR) 10 MG tablet TAKE 1 TABLET BY MOUTH DAILY. 90 tablet 1  . nitrofurantoin, macrocrystal-monohydrate, (MACROBID) 100 MG capsule TAKE 1 CAPSULE (100 MG TOTAL) BY MOUTH 2 (TWO) TIMES DAILY AS NEEDED. 30 capsule 0  . pantoprazole (PROTONIX) 40 MG tablet Take 1 tablet (40 mg total) by mouth 2 (two) times daily. 180 tablet 3  . phenazopyridine (PYRIDIUM) 200 MG tablet Take 1 tablet (200 mg total) 3 (three) times daily as needed by mouth for pain. 30 tablet 1  . promethazine (PHENERGAN) 25 MG tablet Take 1 tablet (25 mg total) by mouth every 8 (eight) hours as needed for  nausea or vomiting. 20 tablet 0  . rizatriptan (MAXALT) 10 MG tablet Take 10 mg by mouth as needed. May repeat in 2 hours if needed    . traMADol (ULTRAM) 50 MG tablet   0  . Tretinoin Microsphere Pump 0.04 % GEL     . venlafaxine XR (EFFEXOR-XR) 150 MG 24 hr capsule Take by mouth.     No current facility-administered medications for this visit.       REVIEW OF SYSTEMS (Negative unless checked)  Constitutional: [] Weight loss  [] Fever  [] Chills Cardiac: [] Chest pain   [] Chest pressure   [] Palpitations   [] Shortness of breath when laying flat   [] Shortness of breath at rest   [] Shortness of breath with exertion. Vascular:  [] Pain in legs with walking   [] Pain in legs at rest   [] Pain in legs when laying flat   [] Claudication   [] Pain in feet when walking  [] Pain in feet at rest  [] Pain in feet when laying flat   [] History of DVT   [] Phlebitis   [x] Swelling in legs   [x] Varicose veins   [] Non-healing ulcers Pulmonary:   [] Uses home oxygen   [] Productive cough   [] Hemoptysis   [] Wheeze  [] COPD   [] Asthma Neurologic:  [] Dizziness  [] Blackouts   [] Seizures   [] History of stroke   [] History of TIA  [] Aphasia   [] Temporary blindness   [] Dysphagia   [] Weakness or numbness in arms   [] Weakness or numbness in legs Musculoskeletal:  [x] Arthritis   [] Joint swelling   [] Joint pain   [] Low back pain Hematologic:  [] Easy bruising  [] Easy bleeding   [] Hypercoagulable state   [] Anemic  [] Hepatitis Gastrointestinal:  [] Blood in stool   [] Vomiting blood  [x] Gastroesophageal reflux/heartburn   [] Abdominal pain Genitourinary:  [] Chronic kidney disease   [] Difficult urination  [] Frequent urination  [] Burning with urination   [] Hematuria Skin:  [] Rashes   [] Ulcers   [] Wounds Psychological:  [x] History of anxiety   []  History of major depression.    Physical Exam BP 140/78 (BP Location: Right Arm,  Patient Position: Sitting)   Pulse 70   Resp 15   Ht 5' 7"  (1.702 m)   Wt 79.4 kg (175 lb)   BMI 27.41 kg/m    Gen:  WD/WN, NAD. Appears younger than stated age Head: [place]/AT, No temporalis wasting.  Ear/Nose/Throat: Hearing grossly intact, dentition good Eyes: Sclera non-icteric. Conjunctiva clear Neck: Supple, no nuchal rigidity. Trachea midline Pulmonary:  Good air movement, no use of accessory muscles, respirations not labored.  Cardiac: RRR, No JVD Vascular: Varicosities diffuse and measuring up to 3 mm in the right lower extremity most prominent in the medial thigh and calf area and tracking posteriorly as well.        Varicosities scattered and measuring up to 1-2 mm in the left lower extremity Vessel Right Left  Radial Palpable Palpable                          PT Palpable Palpable  DP Palpable Palpable    Musculoskeletal: M/S 5/5 throughout.   Trace RLE edema.  No LLE edema Neurologic: Sensation grossly intact in extremities.  Symmetrical.  Speech is fluent.  Psychiatric: Judgment intact, Mood & affect appropriate for pt's clinical situation. Dermatologic: No rashes or ulcers noted.  No cellulitis or open wounds.    Radiology Mm Screening Breast Tomo Bilateral  Result Date: 12/24/2016 CLINICAL DATA:  Screening. EXAM: 2D DIGITAL SCREENING BILATERAL MAMMOGRAM WITH CAD AND ADJUNCT TOMO COMPARISON:  Previous exam(s). ACR Breast Density Category c: The breast tissue is heterogeneously dense, which may obscure small masses. FINDINGS: There are no findings suspicious for malignancy. Images were processed with CAD. IMPRESSION: No mammographic evidence of malignancy. A result letter of this screening mammogram will be mailed directly to the patient. RECOMMENDATION: Screening mammogram in one year. (Code:SM-B-01Y) BI-RADS CATEGORY  1: Negative. Electronically Signed   By: Ammie Ferrier M.D.   On: 12/24/2016 15:29    Labs Recent Results (from the past 2160 hour(s))  POCT urinalysis dipstick     Status: Abnormal   Collection Time: 10/27/16  9:00 AM  Result Value Ref Range   Color, UA  yellow yellow   Clarity, UA cloudy (A) clear   Glucose, UA negative negative mg/dL   Bilirubin, UA negative negative   Ketones, POC UA negative negative mg/dL   Spec Grav, UA 1.020 1.010 - 1.025   Blood, UA negative negative   pH, UA 5.5 5.0 - 8.0   Protein Ur, POC =30 (A) negative mg/dL   Urobilinogen, UA 0.2 0.2 or 1.0 E.U./dL   Nitrite, UA Negative Negative   Leukocytes, UA Negative Negative  Hemoglobin A1c     Status: Abnormal   Collection Time: 10/27/16  9:14 AM  Result Value Ref Range   Hgb A1c MFr Bld 4.5 (L) 4.8 - 5.6 %    Comment:          Prediabetes: 5.7 - 6.4          Diabetes: >6.4          Glycemic control for adults with diabetes: <7.0    Est. average glucose Bld gHb Est-mCnc 82 mg/dL  Lipid panel     Status: Abnormal   Collection Time: 10/27/16  9:14 AM  Result Value Ref Range   Cholesterol, Total 168 100 - 199 mg/dL   Triglycerides 161 (H) 0 - 149 mg/dL   HDL 51 >39 mg/dL   VLDL Cholesterol Cal 32 5 - 40 mg/dL   LDL  Calculated 85 0 - 99 mg/dL   Chol/HDL Ratio 3.3 0.0 - 4.4 ratio    Comment:                                   T. Chol/HDL Ratio                                             Men  Women                               1/2 Avg.Risk  3.4    3.3                                   Avg.Risk  5.0    4.4                                2X Avg.Risk  9.6    7.1                                3X Avg.Risk 23.4   11.0   TSH     Status: None   Collection Time: 10/27/16  9:14 AM  Result Value Ref Range   TSH 0.743 0.450 - 4.500 uIU/mL  Comprehensive metabolic panel     Status: Abnormal   Collection Time: 10/27/16  9:14 AM  Result Value Ref Range   Glucose 89 65 - 99 mg/dL   BUN 14 8 - 27 mg/dL   Creatinine, Ser 0.55 (L) 0.57 - 1.00 mg/dL   GFR calc non Af Amer 102 >59 mL/min/1.73   GFR calc Af Amer 117 >59 mL/min/1.73   BUN/Creatinine Ratio 25 12 - 28   Sodium 141 134 - 144 mmol/L   Potassium 4.2 3.5 - 5.2 mmol/L   Chloride 102 96 - 106 mmol/L   CO2 25 20  - 29 mmol/L   Calcium 9.6 8.7 - 10.3 mg/dL   Total Protein 6.7 6.0 - 8.5 g/dL   Albumin 4.4 3.6 - 4.8 g/dL   Globulin, Total 2.3 1.5 - 4.5 g/dL   Albumin/Globulin Ratio 1.9 1.2 - 2.2   Bilirubin Total 0.2 0.0 - 1.2 mg/dL   Alkaline Phosphatase 69 39 - 117 IU/L   AST 20 0 - 40 IU/L   ALT 16 0 - 32 IU/L  CBC with Differential/Platelet     Status: None   Collection Time: 10/27/16  9:14 AM  Result Value Ref Range   WBC 5.8 3.4 - 10.8 x10E3/uL   RBC 4.51 3.77 - 5.28 x10E6/uL   Hemoglobin 14.0 11.1 - 15.9 g/dL   Hematocrit 43.3 34.0 - 46.6 %   MCV 96 79 - 97 fL   MCH 31.0 26.6 - 33.0 pg   MCHC 32.3 31.5 - 35.7 g/dL   RDW 12.9 12.3 - 15.4 %   Platelets 267 150 - 379 x10E3/uL   Neutrophils 60 Not Estab. %   Lymphs 29 Not Estab. %   Monocytes 6 Not Estab. %   Eos 4 Not Estab. %   Basos 1  Not Estab. %   Neutrophils Absolute 3.5 1.4 - 7.0 x10E3/uL   Lymphocytes Absolute 1.7 0.7 - 3.1 x10E3/uL   Monocytes Absolute 0.4 0.1 - 0.9 x10E3/uL   EOS (ABSOLUTE) 0.2 0.0 - 0.4 x10E3/uL   Basophils Absolute 0.0 0.0 - 0.2 x10E3/uL   Immature Granulocytes 0 Not Estab. %   Immature Grans (Abs) 0.0 0.0 - 0.1 x10E3/uL  Microalbumin / creatinine urine ratio     Status: None   Collection Time: 10/27/16  9:14 AM  Result Value Ref Range   Creatinine, Urine 186.9 Not Estab. mg/dL   Albumin, Urine 44.8 Not Estab. ug/mL   Microalb/Creat Ratio 24.0 0.0 - 30.0 mg/g creat    Comment:                      Normal:                0.0 -  30.0                      Albuminuria:          31.0 - 300.0                      Clinical albuminuria:       >300.0     Assessment/Plan:  Essential hypertension, benign blood pressure control important in reducing the progression of atherosclerotic disease. On appropriate oral medications.   Varicose veins of leg with pain, right   The patient has symptoms consistent with chronic venous insufficiency. We discussed the natural history and treatment options for venous  disease. I recommended the regular use of 20 - 30 mm Hg compression stockings, and she has already been wearing these for several years. I recommended leg elevation and anti-inflammatories as needed for pain. I have also recommended a complete venous duplex to assess the venous system for reflux or thrombotic issues. This can be done at the patient's convenience. I will see the patient back after the duplex to assess the response to conservative management, and determine further treatment options.  Given the fact that she has already worn compression stockings for several years without significant relief, consideration for invasive therapy would be strong.     Leotis Pain 12/31/2016, 3:38 PM   This note was created with Dragon medical transcription system.  Any errors from dictation are unintentional.

## 2016-12-31 NOTE — Assessment & Plan Note (Signed)
blood pressure control important in reducing the progression of atherosclerotic disease. On appropriate oral medications.  

## 2017-01-05 ENCOUNTER — Encounter (INDEPENDENT_AMBULATORY_CARE_PROVIDER_SITE_OTHER): Payer: 59

## 2017-01-05 ENCOUNTER — Ambulatory Visit (INDEPENDENT_AMBULATORY_CARE_PROVIDER_SITE_OTHER): Payer: 59 | Admitting: Vascular Surgery

## 2017-01-06 ENCOUNTER — Encounter (INDEPENDENT_AMBULATORY_CARE_PROVIDER_SITE_OTHER): Payer: Self-pay | Admitting: Vascular Surgery

## 2017-01-06 ENCOUNTER — Ambulatory Visit (INDEPENDENT_AMBULATORY_CARE_PROVIDER_SITE_OTHER): Payer: 59

## 2017-01-06 ENCOUNTER — Ambulatory Visit (INDEPENDENT_AMBULATORY_CARE_PROVIDER_SITE_OTHER): Payer: 59 | Admitting: Vascular Surgery

## 2017-01-06 VITALS — BP 140/81 | HR 72 | Resp 16 | Ht 67.5 in | Wt 174.0 lb

## 2017-01-06 DIAGNOSIS — I872 Venous insufficiency (chronic) (peripheral): Secondary | ICD-10-CM | POA: Diagnosis not present

## 2017-01-06 DIAGNOSIS — I83811 Varicose veins of right lower extremities with pain: Secondary | ICD-10-CM

## 2017-01-06 DIAGNOSIS — E1169 Type 2 diabetes mellitus with other specified complication: Secondary | ICD-10-CM

## 2017-01-06 DIAGNOSIS — E7800 Pure hypercholesterolemia, unspecified: Secondary | ICD-10-CM | POA: Diagnosis not present

## 2017-01-06 DIAGNOSIS — E669 Obesity, unspecified: Secondary | ICD-10-CM

## 2017-01-06 NOTE — Progress Notes (Signed)
Subjective:    Patient ID: Bethany Scott, female    DOB: [birth year], 61 y.o.   MRN: [id number] Chief Complaint  Patient presents with  . Follow-up    Right leg reflux   Patient presents to review vascular studies.  The patient was last seen on December 31, 2016 for evaluation of painful right lower extremity varicose veins.  Prior to her last visit, the patient was seen approximately 3 years ago with the same complaint.  She has been engaging in conservative therapy for approximately 3 years including wearing medical grade 1 compression stockings, elevating her legs and remaining active with minimal improvement to her symptoms.  This is why she has sought medical attention again for her right lower extremity symptoms.  Her symptoms have progressed to the point she is unable to function on a daily basis and they have become lifestyle limiting.  Her symptoms require the use of over-the-counter anti-inflammatories with minimal relief.  The patient underwent a right lower extremity venous reflux exam which was notable for venous incompetence in the right great saphenous vein.  No evidence of deep system or great saphenous vein thrombosis to the right lower extremity.  The right proximal calf to knee level small saphenous vein appears chronically occluded.  The patient denies any fever, nausea vomiting.   Review of Systems  Constitutional: Negative.   HENT: Negative.   Eyes: Negative.   Respiratory: Negative.   Cardiovascular: Positive for leg swelling.       Right Lower Extremity Painful Varicose Veins  Gastrointestinal: Negative.   Endocrine: Negative.   Genitourinary: Negative.   Musculoskeletal: Negative.   Skin: Negative.   Allergic/Immunologic: Negative.   Neurological: Negative.   Hematological: Negative.   Psychiatric/Behavioral: Negative.       Objective:   Physical Exam  Constitutional: She is oriented to person, place, and time. She appears well-developed and well-nourished. No  distress.  HENT:  Head: Normocephalic and atraumatic.  Eyes: Conjunctivae are normal. Pupils are equal, round, and reactive to light.  Neck: Normal range of motion.  Cardiovascular: Normal rate, regular rhythm, normal heart sounds and intact distal pulses.  Pulses:      Radial pulses are 2+ on the right side, and 2+ on the left side.       Dorsalis pedis pulses are 2+ on the right side, and 2+ on the left side.       Posterior tibial pulses are 2+ on the right side, and 2+ on the left side.  Pulmonary/Chest: Effort normal and breath sounds normal.  Musculoskeletal: Normal range of motion. She exhibits edema (mild lower extremity edema noted to right lower extremity).  Neurological: She is alert and oriented to person, place, and time.  Skin: She is not diaphoretic.  Greater than 1 cm and less than 1 cm varicosities noted to the right lower extremity.  There is no cellulitis.  There is no stasis dermatitis.  There are no skin changes.  Psychiatric: She has a normal mood and affect. Her behavior is normal. Judgment and thought content normal.  Vitals reviewed.  BP 140/81 (BP Location: Right Arm)   Pulse 72   Resp 16   Ht 5' 7.5" (1.715 m)   Wt 174 lb (78.9 kg)   BMI 26.85 kg/m   Past Medical History:  Diagnosis Date  . Allergy   . Anemia   . Anxiety   . Arthritis    OA Knee; non-specific autoimmune process followed by [facility]he[facility]ology.  [name]  Asthma   . Blood transfusion without reported diagnosis YRS AGO  . Depression   . Diabetes mellitus without complication ([place])   . Gastric ulcer, unspecified as acute or chronic, without mention of hemorrhage, perforation, or obstruction YRS AGO  . GERD (gastroesophageal reflux disease)   . Hyperlipidemia   . Hypertension   . Migraine    hx of migraines  . Obesity, unspecified   . Other chronic cystitis   . Personal history of colonic polyps   . PONV (postoperative nausea and vomiting)    NAUSEA, SCOPOLAMINE PATCH HELPED WITH  LAST SURGERIES  . Pre-diabetes    Social History   Socioeconomic History  . Marital status: Married    Spouse name: Not on file  . Number of children: 2  . Years of education: 31  . Highest education level: Not on file  Social Needs  . Financial resource strain: Not on file  . Food insecurity - worry: Not on file  . Food insecurity - inability: Not on file  . Transportation needs - medical: Not on file  . Transportation needs - non-medical: Not on file  Occupational History  . 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 Smoking status: Former Smoker    Packs/day: 1.00    Years: 5.00    Pack years: 5.00  . Smokeless tobacco: Never Used  . Tobacco comment: Quit 26 years ago  Substance and Sexual Activity  . Alcohol use: Yes    Comment: rare  . Drug use: No  . Sexual activity: Yes    Birth control/protection: Post-menopausal, Surgical  Other Topics Concern  . Not on file  Social History Narrative   Marital status: married since 16 years; happily married; no abuse.      Children: 2 sons (24, 60); 2 grandchildren  (55yo Atlast, 68 yo August)      Lives: with husband, [name]/son.      Employment: check in at [employer] in [place] x 2004; loves job.      Tobacco abuse:  Former smoker.      Alcohol:  None; holidays only      Exercise: none in 2017     Seatbelt: 100%; no texting   Past Surgical History:  Procedure Laterality Date  . ABDOMINAL HYSTERECTOMY  01/25/1994   uterine fibroids; DUB; cervical dysplasia; ovaries resected two years later.    . APPENDECTOMY    . BILATERAL OOPHORECTOMY  2001  . CESAREAN SECTION     x 2  . COLON SURGERY  YRS AGO   SIGMOIDECTOMY  . Colonoscopy  09/08/2012   diverticulosis, hemorrhoids.  [name].  Symptoms:  anemia, hemoccult +.  . ESOPHAGOGASTRODUODENOSCOPY  09/08/2012   +HH.  [name].  Symptoms: anemia, hemoccult +.  [name] LAPAROSCOPIC GASTRIC  SLEEVE RESECTION N/A 03/01/2016   Procedure: LAPAROSCOPIC GASTRIC SLEEVE RESECTION, UPPER ENDO;  Surgeon: [name], MD;  Location: [facility];  Service: General;  Laterality: N/A;  . LAPAROSCOPIC LYSIS OF ADHESIONS N/A 02/10/2016   Procedure: LAPAROSCOPIC LYSIS OF ADHESIONS;  Surgeon: [name], MD;  Location: [facility];  Service: General;  Laterality: N/A;  . LAPAROSCOPY N/A 02/10/2016   Procedure: LAPAROSCOPY DIAGNOSTIC;  Surgeon: [name], MD;  Location: [facility];  Service: General;  Laterality: N/A;  . NASAL SEPTUM SURGERY    . NECK SURGERY    . Rheumatology Consult  11/26/2011   multiple arthralgias. Autoimmune labs  negative.  Rx for Plaquenil for non-specific autoimmune process.  [name].  [name] SIGMOID RESECTION / RECTOPEXY    . SPINE SURGERY    . TONSILLECTOMY AND ADENOIDECTOMY     Family History  Problem Relation Age of Onset  . Bipolar disorder Father   . Transient ischemic attack Father   . Dementia Father        secondary to head trauma  . Hypertension Father   . Diabetes Father   . CAD Father   . Cancer Mother 63       lung  . Migraines Mother   . Hypothyroidism Mother   . Hypertension Sister   . Atrial fibrillation Sister   . Hypothyroidism Sister   . Alcohol abuse Brother   . Hypertension Brother   . Alcohol abuse Brother   . Atrial fibrillation Maternal Grandmother   . COPD Maternal Grandmother   . Diabetes Maternal Grandmother   . Hyperlipidemia Maternal Grandmother   . Diabetes Maternal Grandfather   . Diabetes Paternal Grandfather   . Heart disease Paternal Grandfather    Allergies  Allergen Reactions  . Cephalosporins Anaphylaxis  . Penicillins Anaphylaxis    Has patient had a PCN reaction causing immediate rash, facial/tongue/throat swelling, SOB or lightheadedness with hypotension: Yes Has patient had a PCN reaction causing severe rash involving mucus membranes or skin necrosis: No Has patient had a PCN reaction that required hospitalization Yes Has  patient had a PCN reaction occurring within the last 10 years: No If all of the above answers are "NO", then may proceed with Cephalosporin use.       Assessment & Plan:  Patient presents to review vascular studies.  The patient was last seen on December 31, 2016 for evaluation of painful right lower extremity varicose veins.  Prior to her last visit, the patient was seen approximately 3 years ago with the same complaint.  She has been engaging in conservative therapy for approximately 3 years including wearing medical grade 1 compression stockings, elevating her legs and remaining active with minimal improvement to her symptoms.  This is why she has sought medical attention again for her right lower extremity symptoms.  Her symptoms have progressed to the point she is unable to function on a daily basis and they have become lifestyle limiting.  Her symptoms require the use of over-the-counter anti-inflammatories with minimal relief.  The patient underwent a right lower extremity venous reflux exam which was notable for venous incompetence in the right great saphenous vein.  No evidence of deep system or great saphenous vein thrombosis to the right lower extremity.  The right proximal calf to knee level small saphenous vein appears chronically occluded.  The patient denies any fever, nausea vomiting.  1. Chronic venous insufficiency - New The patient was found to have venous incompetence to the right great saphenous vein For the last 3 years, the patient has been engaging in conservative therapy including wearing medical grade 1 compression stockings, elevating her legs and remaining active with minimal improvement in symptoms.  They have progressively worsened which is why she sought reevaluation. The patient would benefit from endovenous laser ablation to the right great saphenous vein. The patient is likely to benefit from endovenous laser ablation. I have discussed the risks and benefits of the  procedure. The risks primarily include DVT, recanalization, bleeding, infection, and inability to gain access. I will applied to the patient's insurance. The patient will continue engaging in conservative therapy while I will await approval.  The patient expresses her understanding  2. Varicose veins of leg with pain, right - Stable As above  3. Diabetes mellitus type 2 in obese (HCC) - Stable Encouraged good control as its slows the progression of atherosclerotic disease  4. Pure hypercholesterolemia - Stable Encouraged good control as its slows the progression of atherosclerotic disease  Current Outpatient Medications on File Prior to Visit  Medication Sig Dispense Refill  . beclomethasone (QVAR) 80 MCG/ACT inhaler Inhale 2 puffs into the lungs 2 (two) times daily. 1 Inhaler 12  . Betamethasone Valerate 0.12 % foam   3  . Blood Glucose Monitoring Suppl KIT 1 each by Does not apply route as needed. Please dispense the monitor and supplies compliant with pt insurance ONETOUCH 100 each 11  . cetirizine (ZYRTEC) 10 MG tablet Take 10 mg by mouth daily.    . Cholecalciferol (VITAMIN D3) 5000 units CAPS Take 5,000 Units by mouth daily.    . clarithromycin (BIAXIN) 250 MG tablet Take 1 tablet (250 mg total) by mouth 2 (two) times daily. 20 tablet 0  . conjugated estrogens (PREMARIN) vaginal cream Place 1 Applicatorful vaginally at bedtime. 42.5 g 12  . diclofenac (FLECTOR) 1.3 % PTCH Apply one patch or a portion of patch to painful area of skin twice per day if tolerated (Patient taking differently: Place 1 patch onto the skin 2 (two) times daily as needed (pain). Apply one patch or a portion of patch to painful area of skin twice per day if tolerated) 60 patch 0  . diclofenac sodium (VOLTAREN) 1 % GEL   0  . DULoxetine (CYMBALTA) 60 MG capsule Take 1 capsule (60 mg total) by mouth 2 (two) times daily. 180 capsule 1  . finasteride (PROPECIA) 1 MG tablet   0  . HYDROcodone-acetaminophen (NORCO)  10-325 MG tablet   0  . HYDROcodone-acetaminophen (NORCO) 7.5-325 MG tablet   0  . HYDROcodone-acetaminophen (NORCO/VICODIN) 5-325 MG tablet Take 1 tablet by mouth every 6 (six) hours as needed for moderate pain. 30 tablet 0  . hydrocortisone (ANUSOL-HC) 25 MG suppository Place 1 suppository (25 mg total) rectally 2 (two) times daily as needed for hemorrhoids. 12 suppository 2  . IRON PO Take 1 tablet by mouth 3 (three) times daily.    [name] linaclotide (LINZESS) 72 MCG capsule Take 1 capsule (72 mcg total) by mouth daily before breakfast. 30 capsule 5  . lisinopril (PRINIVIL,ZESTRIL) 5 MG tablet Take by mouth.    . LYSINE PO Take 1 tablet by mouth daily.    . meloxicam (MOBIC) 15 MG tablet TAKE 1 TABLET (15 MG TOTAL) BY MOUTH ONCE DAILY.    . montelukast (SINGULAIR) 10 MG tablet TAKE 1 TABLET BY MOUTH DAILY. 90 tablet 1  . nitrofurantoin, macrocrystal-monohydrate, (MACROBID) 100 MG capsule TAKE 1 CAPSULE (100 MG TOTAL) BY MOUTH 2 (TWO) TIMES DAILY AS NEEDED. 30 capsule 0  . pantoprazole (PROTONIX) 40 MG tablet Take 1 tablet (40 mg total) by mouth 2 (two) times daily. 180 tablet 3  . phenazopyridine (PYRIDIUM) 200 MG tablet Take 1 tablet (200 mg total) 3 (three) times daily as needed by mouth for pain. 30 tablet 1  . promethazine (PHENERGAN) 25 MG tablet Take 1 tablet (25 mg total) by mouth every 8 (eight) hours as needed for nausea or vomiting. 20 tablet 0  . rizatriptan (MAXALT) 10 MG tablet Take 10 mg by mouth as needed. May repeat in 2 hours if needed    . traMADol (ULTRAM) 50 MG  tablet   0  . Tretinoin Microsphere Pump 0.04 % GEL     . venlafaxine XR (EFFEXOR-XR) 150 MG 24 hr capsule Take by mouth.     No current facility-administered medications on file prior to visit.    There are no Patient Instructions on file for this visit. No Follow-up on file.  Teresa Lemmerman A Nimrod Wendt, PA-C

## 2017-01-07 DIAGNOSIS — H2513 Age-related nuclear cataract, bilateral: Secondary | ICD-10-CM | POA: Diagnosis not present

## 2017-01-07 LAB — HM DIABETES EYE EXAM

## 2017-01-12 DIAGNOSIS — M160 Bilateral primary osteoarthritis of hip: Secondary | ICD-10-CM | POA: Diagnosis not present

## 2017-01-12 DIAGNOSIS — M25551 Pain in right hip: Secondary | ICD-10-CM | POA: Diagnosis not present

## 2017-01-12 DIAGNOSIS — M25552 Pain in left hip: Secondary | ICD-10-CM | POA: Diagnosis not present

## 2017-01-12 DIAGNOSIS — G5601 Carpal tunnel syndrome, right upper limb: Secondary | ICD-10-CM | POA: Diagnosis not present

## 2017-01-20 ENCOUNTER — Encounter (INDEPENDENT_AMBULATORY_CARE_PROVIDER_SITE_OTHER): Payer: Self-pay | Admitting: Vascular Surgery

## 2017-02-07 DIAGNOSIS — G5601 Carpal tunnel syndrome, right upper limb: Secondary | ICD-10-CM | POA: Diagnosis not present

## 2017-02-07 DIAGNOSIS — M7060 Trochanteric bursitis, unspecified hip: Secondary | ICD-10-CM | POA: Diagnosis not present

## 2017-02-07 DIAGNOSIS — Z5181 Encounter for therapeutic drug level monitoring: Secondary | ICD-10-CM | POA: Diagnosis not present

## 2017-02-07 DIAGNOSIS — M259 Joint disorder, unspecified: Secondary | ICD-10-CM | POA: Diagnosis not present

## 2017-02-08 DIAGNOSIS — R102 Pelvic and perineal pain: Secondary | ICD-10-CM | POA: Insufficient documentation

## 2017-02-08 DIAGNOSIS — R3129 Other microscopic hematuria: Secondary | ICD-10-CM | POA: Insufficient documentation

## 2017-02-08 DIAGNOSIS — N3020 Other chronic cystitis without hematuria: Secondary | ICD-10-CM | POA: Insufficient documentation

## 2017-02-08 DIAGNOSIS — R339 Retention of urine, unspecified: Secondary | ICD-10-CM | POA: Diagnosis not present

## 2017-02-08 DIAGNOSIS — Z87442 Personal history of urinary calculi: Secondary | ICD-10-CM | POA: Diagnosis not present

## 2017-02-16 DIAGNOSIS — R339 Retention of urine, unspecified: Secondary | ICD-10-CM | POA: Insufficient documentation

## 2017-02-18 DIAGNOSIS — Z87442 Personal history of urinary calculi: Secondary | ICD-10-CM | POA: Diagnosis not present

## 2017-02-18 DIAGNOSIS — R319 Hematuria, unspecified: Secondary | ICD-10-CM | POA: Diagnosis not present

## 2017-02-18 DIAGNOSIS — N3020 Other chronic cystitis without hematuria: Secondary | ICD-10-CM | POA: Diagnosis not present

## 2017-02-18 DIAGNOSIS — R102 Pelvic and perineal pain: Secondary | ICD-10-CM | POA: Diagnosis not present

## 2017-02-18 DIAGNOSIS — R3129 Other microscopic hematuria: Secondary | ICD-10-CM | POA: Diagnosis not present

## 2017-02-18 DIAGNOSIS — S2231XD Fracture of one rib, right side, subsequent encounter for fracture with routine healing: Secondary | ICD-10-CM | POA: Diagnosis not present

## 2017-02-22 ENCOUNTER — Telehealth (INDEPENDENT_AMBULATORY_CARE_PROVIDER_SITE_OTHER): Payer: Self-pay | Admitting: Vascular Surgery

## 2017-02-22 NOTE — Telephone Encounter (Signed)
New Message  Pt verbalized she understands she is suppose to be wearing 20-30 mmHg compression stockings but she is unsure if she is suppose to have a prescription.  Please f/u with pt

## 2017-02-22 NOTE — Telephone Encounter (Signed)
Called patient to let her know that her prescription for her compression stockings is waiting up front for her to pick up. Had to leave a message.

## 2017-02-23 DIAGNOSIS — N3021 Other chronic cystitis with hematuria: Secondary | ICD-10-CM | POA: Diagnosis not present

## 2017-02-23 DIAGNOSIS — R3129 Other microscopic hematuria: Secondary | ICD-10-CM | POA: Diagnosis not present

## 2017-03-01 ENCOUNTER — Ambulatory Visit (INDEPENDENT_AMBULATORY_CARE_PROVIDER_SITE_OTHER): Payer: 59 | Admitting: Vascular Surgery

## 2017-03-01 ENCOUNTER — Encounter (INDEPENDENT_AMBULATORY_CARE_PROVIDER_SITE_OTHER): Payer: Self-pay | Admitting: Vascular Surgery

## 2017-03-01 VITALS — BP 124/68 | HR 90 | Resp 17 | Wt 172.6 lb

## 2017-03-01 DIAGNOSIS — I83811 Varicose veins of right lower extremities with pain: Secondary | ICD-10-CM

## 2017-03-01 NOTE — Progress Notes (Signed)
Varicose veins of leg with pain, right     The patient's right lower extremity was sterilely prepped and draped. The ultrasound machine was used to visualize the saphenous vein throughout its course. A segment just above the knee was selected for access. The saphenous vein was accessed without difficulty using ultrasound guidance with a micro puncture needle. A micro puncture wire and sheath were then placed. A 0.018 wire was placed beyond the saphenofemoral junction through the sheath and the micro puncture sheath was removed. The 65 cm sheath was then placed over the wire and the wire and dilator were removed. The laser fiber was placed through the sheath and its tip was placed approximately 4-5 cm below the saphenofemoral junction. Tumescent anesthesia was then created with a dilute lidocaine solution. Laser energy was then delivered with constant withdrawal of the sheath and laser fiber. Approximately 1016 Joules of energy were delivered over a length of 26 cm using the 1470 Hz Vena[employer] machine at [facility]. Sterile dressings were placed. The patient tolerated the procedure well without complications.

## 2017-03-04 ENCOUNTER — Ambulatory Visit (INDEPENDENT_AMBULATORY_CARE_PROVIDER_SITE_OTHER): Payer: 59

## 2017-03-04 DIAGNOSIS — I83811 Varicose veins of right lower extremities with pain: Secondary | ICD-10-CM | POA: Diagnosis not present

## 2017-03-07 DIAGNOSIS — M259 Joint disorder, unspecified: Secondary | ICD-10-CM | POA: Diagnosis not present

## 2017-03-07 DIAGNOSIS — M7060 Trochanteric bursitis, unspecified hip: Secondary | ICD-10-CM | POA: Diagnosis not present

## 2017-03-07 DIAGNOSIS — Z5181 Encounter for therapeutic drug level monitoring: Secondary | ICD-10-CM | POA: Diagnosis not present

## 2017-03-07 DIAGNOSIS — G5601 Carpal tunnel syndrome, right upper limb: Secondary | ICD-10-CM | POA: Diagnosis not present

## 2017-03-15 ENCOUNTER — Other Ambulatory Visit: Payer: Self-pay | Admitting: Family Medicine

## 2017-03-17 ENCOUNTER — Other Ambulatory Visit: Payer: Self-pay | Admitting: Family Medicine

## 2017-03-27 MED ORDER — PROMETHAZINE HCL 25 MG PO TABS: 25.0000 mg | ORAL_TABLET | Freq: Three times a day (TID) | ORAL | 0 refills | Status: DC | PRN

## 2017-04-01 ENCOUNTER — Ambulatory Visit (INDEPENDENT_AMBULATORY_CARE_PROVIDER_SITE_OTHER): Payer: 59 | Admitting: Vascular Surgery

## 2017-04-04 DIAGNOSIS — Z5181 Encounter for therapeutic drug level monitoring: Secondary | ICD-10-CM | POA: Diagnosis not present

## 2017-04-04 DIAGNOSIS — M7060 Trochanteric bursitis, unspecified hip: Secondary | ICD-10-CM | POA: Diagnosis not present

## 2017-04-04 DIAGNOSIS — M259 Joint disorder, unspecified: Secondary | ICD-10-CM | POA: Diagnosis not present

## 2017-04-04 DIAGNOSIS — G5601 Carpal tunnel syndrome, right upper limb: Secondary | ICD-10-CM | POA: Diagnosis not present

## 2017-04-13 DIAGNOSIS — Z5181 Encounter for therapeutic drug level monitoring: Secondary | ICD-10-CM | POA: Diagnosis not present

## 2017-04-13 DIAGNOSIS — M7060 Trochanteric bursitis, unspecified hip: Secondary | ICD-10-CM | POA: Diagnosis not present

## 2017-04-13 DIAGNOSIS — M259 Joint disorder, unspecified: Secondary | ICD-10-CM | POA: Diagnosis not present

## 2017-04-13 DIAGNOSIS — G5601 Carpal tunnel syndrome, right upper limb: Secondary | ICD-10-CM | POA: Diagnosis not present

## 2017-04-26 ENCOUNTER — Other Ambulatory Visit: Payer: Self-pay

## 2017-04-26 NOTE — Progress Notes (Deleted)
Subjective:    Patient ID: Bethany Scott, female    DOB: [birth year], 62 y.o.   MRN: [id number]  04/27/2017  No chief complaint on file.    HPI This 62 y.o. female presents for evaluation of ***. BP Readings from Last 3 Encounters:  03/01/17 124/68  01/06/17 140/81  12/31/16 140/78   Wt Readings from Last 3 Encounters:  03/01/17 172 lb 9.6 oz (78.3 kg)  01/06/17 174 lb (78.9 kg)  12/31/16 175 lb (79.4 kg)   Immunization History  Administered Date(s) Administered  . Hepatitis B, adult 01/02/2013, 04/04/2013, 09/17/2013  . Influenza Split 11/05/2011, 10/25/2012  . Influenza,inj,Quad PF,6+ Mos 10/28/2015  . Influenza-Unspecified 11/25/2013, 11/26/2014  . Pneumococcal Polysaccharide-23 07/07/2010, 10/27/2016  . Tdap 11/23/2006, 10/27/2016    Review of Systems  Past Medical History:  Diagnosis Date  . Allergy   . Anemia   . Anxiety   . Arthritis    OA Knee; non-specific autoimmune process followed by [facility]Rhe[facility]ology.  . Asthma   . Blood transfusion without reported diagnosis YRS AGO  . Depression   . Diabetes mellitus without complication ([place])   . Gastric ulcer, unspecified as acute or chronic, without mention of hemorrhage, perforation, or obstruction YRS AGO  . GERD (gastroesophageal reflux disease)   . Hyperlipidemia   . Hypertension   . Migraine    hx of migraines  . Obesity, unspecified   . Other chronic cystitis   . Personal history of colonic polyps   . PONV (postoperative nausea and vomiting)    NAUSEA, SCOPOLAMINE PATCH HELPED WITH LAST SURGERIES  . Pre-diabetes    Past Surgical History:  Procedure Laterality Date  . ABDOMINAL HYSTERECTOMY  01/25/1994   uterine fibroids; DUB; cervical dysplasia; ovaries resected two years later.    . APPENDECTOMY    . BILATERAL OOPHORECTOMY  2001  . CESAREAN SECTION     x 2  . COLON SURGERY  YRS AGO   SIGMOIDECTOMY  . Colonoscopy  09/08/2012   diverticulosis, hemorrhoids.  [name].  Symptoms:  anemia,  hemoccult +.  . ESOPHAGOGASTRODUODENOSCOPY  09/08/2012   +HH.  [name].  Symptoms: anemia, hemoccult +.  [name] LAPAROSCOPIC GASTRIC SLEEVE RESECTION N/A 03/01/2016   Procedure: LAPAROSCOPIC GASTRIC SLEEVE RESECTION, UPPER ENDO;  Surgeon: [name], MD;  Location: [facility];  Service: General;  Laterality: N/A;  . LAPAROSCOPIC LYSIS OF ADHESIONS N/A 02/10/2016   Procedure: LAPAROSCOPIC LYSIS OF ADHESIONS;  Surgeon: [name], MD;  Location: [facility];  Service: General;  Laterality: N/A;  . LAPAROSCOPY N/A 02/10/2016   Procedure: LAPAROSCOPY DIAGNOSTIC;  Surgeon: [name], MD;  Location: [facility];  Service: General;  Laterality: N/A;  . NASAL SEPTUM SURGERY    . NECK SURGERY    . Rheumatology Consult  11/26/2011   multiple arthralgias. Autoimmune labs negative.  Rx for Plaquenil for non-specific autoimmune process.  [name].  [name] SIGMOID RESECTION / RECTOPEXY    . SPINE SURGERY    . TONSILLECTOMY AND ADENOIDECTOMY     Allergies  Allergen Reactions  . Cephalexin Anaphylaxis  . Cephalosporins Anaphylaxis  . Penicillins Anaphylaxis    Has patient had a PCN reaction causing immediate rash, facial/tongue/throat swelling, SOB or lightheadedness with hypotension: Yes Has patient had a PCN reaction causing severe rash involving mucus membranes or skin necrosis: No Has patient had a PCN reaction that required hospitalization Yes Has patient had a PCN reaction occurring within the last 10 years: No If all of the above answers are "NO",  then may proceed with Cephalosporin use.    Current Outpatient Medications on File Prior to Visit  Medication Sig Dispense Refill  . ALPRAZolam (XANAX) 0.5 MG tablet   0  . beclomethasone (QVAR) 80 MCG/ACT inhaler Inhale into the lungs.    . Betamethasone Valerate 0.12 % foam   3  . Blood Glucose Monitoring Suppl KIT 1 each by Does not apply route as needed. Please dispense the monitor and supplies compliant with pt insurance ONETOUCH 100 each 11  . cetirizine  (ZYRTEC) 10 MG tablet Take 10 mg by mouth daily.    . Cholecalciferol (VITAMIN D3) 5000 units CAPS Take 5,000 Units by mouth daily.    . clarithromycin (BIAXIN) 250 MG tablet Take 1 tablet (250 mg total) by mouth 2 (two) times daily. 20 tablet 0  . conjugated estrogens (PREMARIN) vaginal cream Place 1 Applicatorful vaginally at bedtime. 42.5 g 12  . diclofenac (FLECTOR) 1.3 % PTCH Apply one patch or a portion of patch to painful area of skin twice per day if tolerated (Patient taking differently: Place 1 patch onto the skin 2 (two) times daily as needed (pain). Apply one patch or a portion of patch to painful area of skin twice per day if tolerated) 60 patch 0  . diclofenac sodium (VOLTAREN) 1 % GEL   0  . DULoxetine (CYMBALTA) 60 MG capsule Take 1 capsule (60 mg total) by mouth 2 (two) times daily. 180 capsule 1  . finasteride (PROPECIA) 1 MG tablet   0  . HYDROcodone-acetaminophen (NORCO) 10-325 MG tablet     . hydrocortisone (ANUSOL-HC) 25 MG suppository Place 1 suppository (25 mg total) rectally 2 (two) times daily as needed for hemorrhoids. 12 suppository 2  . IRON PO Take 1 tablet by mouth 3 (three) times daily.    [name] linaclotide (LINZESS) 72 MCG capsule Take 1 capsule (72 mcg total) by mouth daily before breakfast. 30 capsule 5  . lisinopril (PRINIVIL,ZESTRIL) 5 MG tablet Take by mouth.    . LYSINE PO Take 1 tablet by mouth daily.    . meloxicam (MOBIC) 15 MG tablet TAKE 1 TABLET (15 MG TOTAL) BY MOUTH ONCE DAILY.    . montelukast (SINGULAIR) 10 MG tablet TAKE 1 TABLET BY MOUTH DAILY. 90 tablet 1  . NARCAN 4 MG/0.1ML LIQD nasal spray kit   0  . nitrofurantoin, macrocrystal-monohydrate, (MACROBID) 100 MG capsule TAKE 1 CAPSULE (100 MG TOTAL) BY MOUTH 2 (TWO) TIMES DAILY AS NEEDED. 30 capsule 0  . pantoprazole (PROTONIX) 40 MG tablet Take 1 tablet (40 mg total) by mouth 2 (two) times daily. 180 tablet 3  . phenazopyridine (PYRIDIUM) 200 MG tablet Take 1 tablet (200 mg total) 3 (three) times  daily as needed by mouth for pain. 30 tablet 1  . promethazine (PHENERGAN) 25 MG tablet Take 1 tablet (25 mg total) by mouth every 8 (eight) hours as needed for nausea or vomiting. 20 tablet 0  . rizatriptan (MAXALT) 10 MG tablet Take 10 mg by mouth as needed. May repeat in 2 hours if needed    . sodium chloride irrigation 0.9 % irrigation Irrigate with as directed.    . traMADol (ULTRAM) 50 MG tablet   0  . Tretinoin Microsphere Pump 0.04 % GEL     . venlafaxine XR (EFFEXOR-XR) 150 MG 24 hr capsule Take by mouth.    [name] ZOHYDRO ER 10 MG C12A   0   No current facility-administered medications on file prior to visit.  Social History   Socioeconomic History  . Marital status: Married    Spouse name: Not on file  . Number of children: 2  . Years of education: 67  . Highest education level: Not on file  Occupational History  . 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 Financial resource strain: Not on file  . Food insecurity:    Worry: Not on file    Inability: Not on file  . Transportation needs:    Medical: Not on file    Non-medical: Not on file  Tobacco Use  . Smoking status: Former Smoker    Packs/day: 1.00    Years: 5.00    Pack years: 5.00  . Smokeless tobacco: Never Used  . Tobacco comment: Quit 26 years ago  Substance and Sexual Activity  . Alcohol use: Yes    Comment: rare  . Drug use: No  . Sexual activity: Yes    Birth control/protection: Post-menopausal, Surgical  Lifestyle  . Physical activity:    Days per week: Not on file    Minutes per session: Not on file  . Stress: Not on file  Relationships  . Social connections:    Talks on phone: Not on file    Gets together: Not on file    Attends religious service: Not on file    Active member of club or organization: Not on file    Attends meetings of clubs or organizations: Not on file    Relationship status: Not on file  .  Intimate partner violence:    Fear of current or ex partner: Not on file    Emotionally abused: Not on file    Physically abused: Not on file    Forced sexual activity: Not on file  Other Topics Concern  . Not on file  Social History Narrative   Marital status: married since 41 years; happily married; no abuse.      Children: 2 sons (24, 83); 2 grandchildren  (75yo Atlast, 29 yo August)      Lives: with husband, [name]/son.      Employment: check in at [employer] in [place] x 2004; loves job.      Tobacco abuse:  Former smoker.      Alcohol:  None; holidays only      Exercise: none in 2017     Seatbelt: 100%; no texting   Family History  Problem Relation Age of Onset  . Bipolar disorder Father   . Transient ischemic attack Father   . Dementia Father        secondary to head trauma  . Hypertension Father   . Diabetes Father   . CAD Father   . Cancer Mother 4       lung  . Migraines Mother   . Hypothyroidism Mother   . Hypertension Sister   . Atrial fibrillation Sister   . Hypothyroidism Sister   . Alcohol abuse Brother   . Hypertension Brother   . Alcohol abuse Brother   . Atrial fibrillation Maternal Grandmother   . COPD Maternal Grandmother   . Diabetes Maternal Grandmother   . Hyperlipidemia Maternal Grandmother   . Diabetes Maternal Grandfather   . Diabetes Paternal Grandfather   . Heart disease Paternal Grandfather        Objective:    There were no vitals taken for this visit. Physical Exam No results found. Depression  screen [facility] 2/9 10/27/2016 04/27/2016 02/04/2016 10/28/2015 10/06/2015  Decreased Interest 0 0 0 0 0  Down, Depressed, Hopeless 0 0 0 0 0  PHQ - 2 Score 0 0 0 0 0   Fall Risk  10/27/2016 04/27/2016 02/04/2016 10/28/2015 10/06/2015  Falls in the past year? _0   Comment - - - - -  Number falls in past yr: - - - - -  Injury with Fall? - - - - -  Comment - - - - -  Risk for fall due to : - - - - -  Follow up - - - - -          Assessment & Plan:   1. Essential hypertension, benign   2. Diabetes mellitus type 2 in obese ([place])   3. Depression with anxiety   4. Pure hypercholesterolemia   5. DDD (degenerative disc disease), lumbar   6. Degenerative joint disease of sacroiliac joint   7. Migraine without aura and without status migrainosus, not intractable   8. Chronic venous insufficiency     ***  No orders of the defined types were placed in this encounter.  No orders of the defined types were placed in this encounter.   No follow-ups on file.   Kristi Elayne Guerin, M.D. Primary Care at [facility] previously [facility] [address] [place], [place]  [address] [phone] phone [phone] fax

## 2017-04-27 ENCOUNTER — Ambulatory Visit: Payer: 59 | Admitting: Family Medicine

## 2017-04-29 ENCOUNTER — Ambulatory Visit (INDEPENDENT_AMBULATORY_CARE_PROVIDER_SITE_OTHER): Payer: Self-pay | Admitting: Family Medicine

## 2017-04-29 VITALS — BP 120/80 | HR 84 | Temp 98.3°F | Wt 183.0 lb

## 2017-04-29 DIAGNOSIS — J3089 Other allergic rhinitis: Secondary | ICD-10-CM

## 2017-04-29 DIAGNOSIS — J0100 Acute maxillary sinusitis, unspecified: Secondary | ICD-10-CM

## 2017-04-29 MED ORDER — DOXYCYCLINE HYCLATE 100 MG PO TABS: 100.0000 mg | ORAL_TABLET | Freq: Two times a day (BID) | ORAL | 0 refills | Status: DC

## 2017-04-29 NOTE — Progress Notes (Signed)
Bethany Scott is a 62 y.o. female who is present requesting evaluation for sore throat with associated symptoms of greenish- yellow mucus for the past 2 weeks. She reports some known seasonal allergies and has attempted OTC treatments of antihistamines without relief. She has a pending PCP visit tomorrow for this condition and way hoping to have an evaluation today while the pharmacy here at [facility] was open.   Review of Systems  Constitutional: Negative.   HENT: Positive for sinus pain and sore throat.   Eyes: Negative.   Respiratory: Negative.  Negative for stridor.   Cardiovascular: Negative.   Gastrointestinal: Negative.   Genitourinary: Negative.   Musculoskeletal: Negative.   Skin: Negative.   Neurological: Positive for headaches.  Psychiatric/Behavioral: Negative.     O: Vitals:   04/29/17 1254  BP: 120/80  Pulse: 84  Temp: 98.3 F (36.8 C)  SpO2: 98%   Physical Exam  Constitutional: She is oriented to person, place, and time. Vital signs are normal. She appears well-developed and well-nourished. She is active.  HENT:  Head: Normocephalic and atraumatic.  Right Ear: Hearing, external ear and ear canal normal. A middle ear effusion is present.  Left Ear: Hearing, external ear and ear canal normal. A middle ear effusion is present.  Nose: Rhinorrhea present. Right sinus exhibits maxillary sinus tenderness. Left sinus exhibits maxillary sinus tenderness.  Mouth/Throat: Uvula is midline and mucous membranes are normal. Posterior oropharyngeal erythema present. No tonsillar exudate.  Eyes: Pupils are equal, round, and reactive to light.  Neck: Normal range of motion. Neck supple.  Cardiovascular: Normal rate, regular rhythm and normal heart sounds.  Pulmonary/Chest: Effort normal and breath sounds normal. She has no decreased breath sounds.  Abdominal: Soft. Bowel sounds are normal.  Musculoskeletal: Normal range of motion.  Lymphadenopathy:       Head (right side): No  submental, no submandibular and no tonsillar adenopathy present.       Head (left side): No submental, no submandibular and no tonsillar adenopathy present.    She has no cervical adenopathy.  Neurological: She is alert and oriented to person, place, and time.  Skin: Skin is warm and dry.    A: 1. Acute non-recurrent maxillary sinusitis   2. Non-seasonal allergic rhinitis, unspecified trigger    P: Diagnosis etiology and medication use and indications discussed with patient who voiced concerns of antibiotic selection due to multiple allergies and historical use/need for flouroquinolone as only agent to resolve symptoms.  She declined the use of doxycycline and reports she will seek treatment with PCP on 4/6 who knows about her allergies and prior history with antibiotic treatment failure. Provider agrees that this assessment by PCP may be the best course of action due to known risks with flouroquinolone drug class (QT prolongation and risk for tendon rupture) and the fact that this is not the first line recommended treatment for this condition despite history and allergies.  1. Acute non-recurrent maxillary sinusitis - doxycycline (VIBRA-TABS) 100 MG tablet; Take 1 tablet (100 mg total) by mouth 2 (two) times daily.  2. Non-seasonal allergic rhinitis, unspecified trigger Continue previous regimen for seasonal allergies.

## 2017-04-29 NOTE — Patient Instructions (Addendum)
Sinusitis, Adult Sinusitis is soreness and inflammation of your sinuses. Sinuses are hollow spaces in the bones around your face. They are located:  Around your eyes.  In the middle of your forehead.  Behind your nose.  In your cheekbones.  Your sinuses and nasal passages are lined with a stringy fluid (mucus). Mucus normally drains out of your sinuses. When your nasal tissues get inflamed or swollen, the mucus can get trapped or blocked so air cannot flow through your sinuses. This lets bacteria, viruses, and funguses grow, and that leads to infection. Follow these instructions at home: Medicines  Take, use, or apply over-the-counter and prescription medicines only as told by your doctor. These may include nasal sprays.  If you were prescribed an antibiotic medicine, take it as told by your doctor. Do not stop taking the antibiotic even if you start to feel better. Hydrate and Humidify  Drink enough water to keep your pee (urine) clear or pale yellow.  Use a cool mist humidifier to keep the humidity level in your home above 50%.  Breathe in steam for 10-15 minutes, 3-4 times a day or as told by your doctor. You can do this in the bathroom while a hot shower is running.  Try not to spend time in cool or dry air. Rest  Rest as much as possible.  Sleep with your head raised (elevated).  Make sure to get enough sleep each night. General instructions  Put a warm, moist washcloth on your face 3-4 times a day or as told by your doctor. This will help with discomfort.  Wash your hands often with soap and water. If there is no soap and water, use hand sanitizer.  Do not smoke. Avoid being around people who are smoking (secondhand smoke).  Keep all follow-up visits as told by your doctor. This is important. Contact a doctor if:  You have a fever.  Your symptoms get worse.  Your symptoms do not get better within 10 days. Get help right away if:  You have a very bad  headache.  You cannot stop throwing up (vomiting).  You have pain or swelling around your face or eyes.  You have trouble seeing.  You feel confused.  Your neck is stiff.  You have trouble breathing. This information is not intended to replace advice given to you by your health care provider. Make sure you discuss any questions you have with your health care provider. Document Released: 06/30/2007 Document Revised: 09/07/2015 Document Reviewed: 11/06/2014 Elsevier Interactive Patient Education  2018 [employer]. Doxycycline tablets or capsules What is this medicine? DOXYCYCLINE (dox i SYE kleen) is a tetracycline antibiotic. It kills certain bacteria or stops their growth. It is used to treat many kinds of infections, like dental, skin, respiratory, and urinary tract infections. It also treats acne, Lyme disease, malaria, and certain sexually transmitted infections. This medicine may be used for other purposes; ask your health care provider or pharmacist if you have questions. COMMON BRAND NAME(S): Acticlate, Adoxa, Adoxa CK, Adoxa Pak, Adoxa TT, Alodox, Avidoxy, Doxal, Mondoxyne NL, Monodox, Morgidox 1x, Morgidox 1x Kit, Morgidox 2x, Morgidox 2x Kit, NutriDox, Ocudox, TARGADOX, Vibra-Tabs, Vibramycin What should I tell my health care provider before I take this medicine? They need to know if you have any of these conditions: -liver disease -long exposure to sunlight like working outdoors -stomach problems like colitis -an unusual or allergic reaction to doxycycline, tetracycline antibiotics, other medicines, foods, dyes, or preservatives -pregnant or trying to get pregnant -  breast-feeding How should I use this medicine? Take this medicine by mouth with a full glass of water. Follow the directions on the prescription label. It is best to take this medicine without food, but if it upsets your stomach take it with food. Take your medicine at regular intervals. Do not take your medicine  more often than directed. Take all of your medicine as directed even if you think you are better. Do not skip doses or stop your medicine early. Talk to your pediatrician regarding the use of this medicine in children. While this drug may be prescribed for selected conditions, precautions do apply. Overdosage: If you think you have taken too much of this medicine contact a poison control center or emergency room at once. NOTE: This medicine is only for you. Do not share this medicine with others. What if I miss a dose? If you miss a dose, take it as soon as you can. If it is almost time for your next dose, take only that dose. Do not take double or extra doses. What may interact with this medicine? -antacids -barbiturates -birth control pills -bismuth subsalicylate -carbamazepine -methoxyflurane -other antibiotics -phenytoin -vitamins that contain iron -warfarin This list may not describe all possible interactions. Give your health care provider a list of all the medicines, herbs, non-prescription drugs, or dietary supplements you use. Also tell them if you smoke, drink alcohol, or use illegal drugs. Some items may interact with your medicine. What should I watch for while using this medicine? Tell your doctor or health care professional if your symptoms do not improve. Do not treat diarrhea with over the counter products. Contact your doctor if you have diarrhea that lasts more than 2 days or if it is severe and watery. Do not take this medicine just before going to bed. It may not dissolve properly when you lay down and can cause pain in your throat. Drink plenty of fluids while taking this medicine to also help reduce irritation in your throat. This medicine can make you more sensitive to the sun. Keep out of the sun. If you cannot avoid being in the sun, wear protective clothing and use sunscreen. Do not use sun lamps or tanning beds/booths. Birth control pills may not work properly while  you are taking this medicine. Talk to your doctor about using an extra method of birth control. If you are being treated for a sexually transmitted infection, avoid sexual contact until you have finished your treatment. Your sexual partner may also need treatment. Avoid antacids, aluminum, calcium, magnesium, and iron products for 4 hours before and 2 hours after taking a dose of this medicine. If you are using this medicine to prevent malaria, you should still protect yourself from contact with mosquitos. Stay in screened-in areas, use mosquito nets, keep your body covered, and use an insect repellent. What side effects may I notice from receiving this medicine? Side effects that you should report to your doctor or health care professional as soon as possible: -allergic reactions like skin rash, itching or hives, swelling of the face, lips, or tongue -difficulty breathing -fever -itching in the rectal or genital area -pain on swallowing -redness, blistering, peeling or loosening of the skin, including inside the mouth -severe stomach pain or cramps -unusual bleeding or bruising -unusually weak or tired -yellowing of the eyes or skin Side effects that usually do not require medical attention (report to your doctor or health care professional if they continue or are bothersome): -diarrhea -loss of  appetite -nausea, vomiting This list may not describe all possible side effects. Call your doctor for medical advice about side effects. You may report side effects to FDA at 1-800-FDA-1088. Where should I keep my medicine? Keep out of the reach of children. Store at room temperature, below 30 degrees C (86 degrees F). Protect from light. Keep container tightly closed. Throw away any unused medicine after the expiration date. Taking this medicine after the expiration date can make you seriously ill. NOTE: This sheet is a summary. It may not cover all possible information. If you have questions about  this medicine, talk to your doctor, pharmacist, or health care provider.  2018 Elsevier/Gold Standard (2015-02-12 17:11:22)

## 2017-04-30 ENCOUNTER — Encounter: Payer: Self-pay | Admitting: Family Medicine

## 2017-04-30 ENCOUNTER — Other Ambulatory Visit: Payer: Self-pay

## 2017-04-30 ENCOUNTER — Ambulatory Visit: Payer: 59 | Admitting: Family Medicine

## 2017-04-30 VITALS — BP 132/77 | HR 84 | Temp 98.4°F | Resp 16 | Ht 67.0 in | Wt 181.0 lb

## 2017-04-30 DIAGNOSIS — J0101 Acute recurrent maxillary sinusitis: Secondary | ICD-10-CM

## 2017-04-30 DIAGNOSIS — E7800 Pure hypercholesterolemia, unspecified: Secondary | ICD-10-CM | POA: Diagnosis not present

## 2017-04-30 DIAGNOSIS — G43009 Migraine without aura, not intractable, without status migrainosus: Secondary | ICD-10-CM | POA: Diagnosis not present

## 2017-04-30 DIAGNOSIS — Z9884 Bariatric surgery status: Secondary | ICD-10-CM | POA: Diagnosis not present

## 2017-04-30 DIAGNOSIS — F418 Other specified anxiety disorders: Secondary | ICD-10-CM | POA: Diagnosis not present

## 2017-04-30 DIAGNOSIS — K219 Gastro-esophageal reflux disease without esophagitis: Secondary | ICD-10-CM | POA: Diagnosis not present

## 2017-04-30 DIAGNOSIS — R7989 Other specified abnormal findings of blood chemistry: Secondary | ICD-10-CM

## 2017-04-30 DIAGNOSIS — I10 Essential (primary) hypertension: Secondary | ICD-10-CM

## 2017-04-30 DIAGNOSIS — J301 Allergic rhinitis due to pollen: Secondary | ICD-10-CM

## 2017-04-30 DIAGNOSIS — E1169 Type 2 diabetes mellitus with other specified complication: Secondary | ICD-10-CM

## 2017-04-30 DIAGNOSIS — E669 Obesity, unspecified: Secondary | ICD-10-CM

## 2017-04-30 LAB — POCT URINALYSIS DIP (MANUAL ENTRY)
BILIRUBIN UA: NEGATIVE
BILIRUBIN UA: NEGATIVE mg/dL
Glucose, UA: NEGATIVE mg/dL
LEUKOCYTES UA: NEGATIVE
Nitrite, UA: NEGATIVE
PH UA: 5.5 (ref 5.0–8.0)
PROTEIN UA: NEGATIVE mg/dL
Spec Grav, UA: 1.025 (ref 1.010–1.025)
Urobilinogen, UA: 0.2 E.U./dL

## 2017-04-30 MED ORDER — MONTELUKAST SODIUM 10 MG PO TABS: 10.0000 mg | ORAL_TABLET | Freq: Every day | ORAL | 3 refills | Status: DC

## 2017-04-30 MED ORDER — DULOXETINE HCL 60 MG PO CPEP: 60.0000 mg | ORAL_CAPSULE | Freq: Two times a day (BID) | ORAL | 1 refills | Status: DC

## 2017-04-30 MED ORDER — LEVOFLOXACIN 750 MG PO TABS: 750.0000 mg | ORAL_TABLET | Freq: Every day | ORAL | 0 refills | Status: DC

## 2017-04-30 NOTE — Progress Notes (Signed)
Subjective:    Patient ID: Bethany Scott, female    DOB: 15-Feb-1955, 62 y.o.   MRN: [id number]  04/30/2017  Follow-up (6 months) and Sinus Problem (horse voice and congestion/ x 8 days)    HPI This 62 y.o. female presents for six month follow-up of migraines, anxiety/depression, hypercholesterolemia, GERD, asthma with allergic rhinitis.  Management changes made at last visit include the following:  -s/p gastric sleeve with significant weight loss; congratulations on progress!    -continues to suffer with ongoing family stressors; coping well at this time; continue psychotherapy with [facility] and continue current medications.   -suffering with constipation due gastric sleeve; rx for Linzess provided; to discuss further with surgeon in upcoming months. -MyChart message sent 10/29/16 requesting abx; rx for Biaxin provided for acute sinusitis; symptoms worsened after visit two days ago.  -labs all normal at last visit with hgba1c of 4.5.   UPDATE SINCE LAST VISIT INCLUDES THE FOLLOWING: Has gained weight six pounds since last visit.  Has not been swimming.  Pool has been closed.   Eating pretzels at work.  Making poor decisions regarding eating.  Realized that eating too many pretzels.  Met one year mark; started at 30; now 181.  Feels so much better.    R wrist: not good.   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  R hip is worse.  Plans to complete this year.  Anterior approach.   Linzess was horrible; had to stop.  Takes 3 laxatives every morning and 3 laxatives every night.  Advised Dr. [name] regarding laxative use.    Has not seen grandbabies since October 2018.    Sinus congestion: onset seven to nine days; no fever.     BP Readings from Last 3 Encounters:  04/30/17 132/77  04/29/17 120/80  03/01/17 124/68   Wt Readings from Last 3 Encounters:  04/30/17 181 lb (82.1 kg)  04/29/17 183 lb (83 kg)  03/01/17 172 lb 9.6 oz (78.3 kg)   Immunization History    Administered Date(s) Administered  . Hepatitis B, adult 01/02/2013, 04/04/2013, 09/17/2013  . Influenza Split 11/05/2011, 10/25/2012  . Influenza,inj,Quad PF,6+ Mos 10/28/2015  . Influenza-Unspecified 11/25/2013, 11/26/2014  . Pneumococcal Polysaccharide-23 07/07/2010, 10/27/2016  . Tdap 11/23/2006, 10/27/2016    Review of Systems  Constitutional: Negative for chills, diaphoresis, fatigue and fever.  HENT: Positive for congestion, postnasal drip, rhinorrhea, sinus pressure and sinus pain. Negative for ear pain, sore throat, trouble swallowing and voice change.   Eyes: Negative for visual disturbance.  Respiratory: Negative for cough and shortness of breath.   Cardiovascular: Negative for chest pain, palpitations and leg swelling.  Gastrointestinal: Negative for abdominal pain, constipation, diarrhea, nausea and vomiting.  Endocrine: Negative for cold intolerance, heat intolerance, polydipsia, polyphagia and polyuria.  Musculoskeletal: Positive for arthralgias, back pain, gait problem and myalgias.  Neurological: Negative for dizziness, tremors, seizures, syncope, facial asymmetry, speech difficulty, weakness, light-headedness, numbness and headaches.  Psychiatric/Behavioral: Positive for dysphoric mood. The patient is not nervous/anxious.     Past Medical History:  Diagnosis Date  . Allergy   . Anemia   . Anxiety   . Arthritis    OA Knee; non-specific autoimmune process followed by [facility].  . Asthma   . Blood transfusion without reported diagnosis YRS AGO  . Depression   . Diabetes mellitus without complication ([place])   . Gastric ulcer, unspecified as acute or chronic, without mention of hemorrhage, perforation, or obstruction YRS AGO  .  GERD (gastroesophageal reflux disease)   . Hyperlipidemia   . Hypertension   . Migraine    hx of migraines  . Obesity, unspecified   . Other chronic cystitis   . Personal history of colonic polyps   . PONV  (postoperative nausea and vomiting)    NAUSEA, SCOPOLAMINE PATCH HELPED WITH LAST SURGERIES  . Pre-diabetes    Past Surgical History:  Procedure Laterality Date  . ABDOMINAL HYSTERECTOMY  01/25/1994   uterine fibroids; DUB; cervical dysplasia; ovaries resected two years later.    . APPENDECTOMY    . BILATERAL OOPHORECTOMY  2001  . CESAREAN SECTION     x 2  . COLON SURGERY  YRS AGO   SIGMOIDECTOMY  . Colonoscopy  09/08/2012   diverticulosis, hemorrhoids.  [name].  Symptoms:  anemia, hemoccult +.  . ESOPHAGOGASTRODUODENOSCOPY  09/08/2012   +HH.  [name].  Symptoms: anemia, hemoccult +.  [name] LAPAROSCOPIC GASTRIC SLEEVE RESECTION N/A 03/01/2016   Procedure: LAPAROSCOPIC GASTRIC SLEEVE RESECTION, UPPER ENDO;  Surgeon: [name], MD;  Location: [facility];  Service: General;  Laterality: N/A;  . LAPAROSCOPIC LYSIS OF ADHESIONS N/A 02/10/2016   Procedure: LAPAROSCOPIC LYSIS OF ADHESIONS;  Surgeon: [name], MD;  Location: [facility];  Service: General;  Laterality: N/A;  . LAPAROSCOPY N/A 02/10/2016   Procedure: LAPAROSCOPY DIAGNOSTIC;  Surgeon: [name], MD;  Location: [facility];  Service: General;  Laterality: N/A;  . NASAL SEPTUM SURGERY    . NECK SURGERY    . Rheumatology Consult  11/26/2011   multiple arthralgias. Autoimmune labs negative.  Rx for Plaquenil for non-specific autoimmune process.  [name].  [name] SIGMOID RESECTION / RECTOPEXY    . SPINE SURGERY    . TONSILLECTOMY AND ADENOIDECTOMY     Allergies  Allergen Reactions  . Cephalexin Anaphylaxis  . Cephalosporins Anaphylaxis  . Penicillins Anaphylaxis    Has patient had a PCN reaction causing immediate rash, facial/tongue/throat swelling, SOB or lightheadedness with hypotension: Yes Has patient had a PCN reaction causing severe rash involving mucus membranes or skin necrosis: No Has patient had a PCN reaction that required hospitalization Yes Has patient had a PCN reaction occurring within the last 10 years: No If all of the  above answers are "NO", then may proceed with Cephalosporin use.    Current Outpatient Medications on File Prior to Visit  Medication Sig Dispense Refill  . beclomethasone (QVAR) 80 MCG/ACT inhaler Inhale into the lungs.    . Betamethasone Valerate 0.12 % foam   3  . cetirizine (ZYRTEC) 10 MG tablet Take 10 mg by mouth daily.    . Cholecalciferol (VITAMIN D3) 5000 units CAPS Take 5,000 Units by mouth daily.    [name] conjugated estrogens (PREMARIN) vaginal cream Place 1 Applicatorful vaginally at bedtime. 42.5 g 12  . diclofenac (FLECTOR) 1.3 % PTCH Apply one patch or a portion of patch to painful area of skin twice per day if tolerated (Patient taking differently: Place 1 patch onto the skin 2 (two) times daily as needed (pain). Apply one patch or a portion of patch to painful area of skin twice per day if tolerated) 60 patch 0  . doxycycline (VIBRA-TABS) 100 MG tablet Take 1 tablet (100 mg total) by mouth 2 (two) times daily. 20 tablet 0  . hydrocortisone (ANUSOL-HC) 25 MG suppository Place 1 suppository (25 mg total) rectally 2 (two) times daily as needed for hemorrhoids. 12 suppository 2  . IRON PO Take 1 tablet by mouth 3 (three) times daily.    [name]  lisinopril (PRINIVIL,ZESTRIL) 5 MG tablet Take by mouth.    . LYSINE PO Take 1 tablet by mouth daily.    . meloxicam (MOBIC) 15 MG tablet TAKE 1 TABLET (15 MG TOTAL) BY MOUTH ONCE DAILY.    [name] NARCAN 4 MG/0.1ML LIQD nasal spray kit   0  . nitrofurantoin, macrocrystal-monohydrate, (MACROBID) 100 MG capsule TAKE 1 CAPSULE (100 MG TOTAL) BY MOUTH 2 (TWO) TIMES DAILY AS NEEDED. 30 capsule 0  . oxyCODONE-acetaminophen (PERCOCET) 10-325 MG tablet     . pantoprazole (PROTONIX) 40 MG tablet Take 1 tablet (40 mg total) by mouth 2 (two) times daily. 180 tablet 3  . phenazopyridine (PYRIDIUM) 200 MG tablet Take 1 tablet (200 mg total) 3 (three) times daily as needed by mouth for pain. 30 tablet 1  . promethazine (PHENERGAN) 25 MG tablet Take 1 tablet (25 mg  total) by mouth every 8 (eight) hours as needed for nausea or vomiting. 20 tablet 0  . rizatriptan (MAXALT) 10 MG tablet Take 10 mg by mouth as needed. May repeat in 2 hours if needed    . sodium chloride irrigation 0.9 % irrigation Irrigate with as directed.    . Tretinoin Microsphere Pump 0.04 % GEL     . ZOHYDRO ER 10 MG C12A   0  . linaclotide (LINZESS) 72 MCG capsule Take 1 capsule (72 mcg total) by mouth daily before breakfast. (Patient not taking: Reported on 04/29/2017) 30 capsule 5   No current facility-administered medications on file prior to visit.    Social History   Socioeconomic History  . Marital status: Married    Spouse name: Not on file  . Number of children: 2  . Years of education: 27  . Highest education level: Not on file  Occupational History  . 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 Financial resource strain: Not on file  . Food insecurity:    Worry: Not on file    Inability: Not on file  . Transportation needs:    Medical: Not on file    Non-medical: Not on file  Tobacco Use  . Smoking status: Former Smoker    Packs/day: 1.00    Years: 5.00    Pack years: 5.00  . Smokeless tobacco: Never Used  . Tobacco comment: Quit 26 years ago  Substance and Sexual Activity  . Alcohol use: Yes    Comment: rare  . Drug use: No  . Sexual activity: Yes    Birth control/protection: Post-menopausal, Surgical  Lifestyle  . Physical activity:    Days per week: Not on file    Minutes per session: Not on file  . Stress: Not on file  Relationships  . Social connections:    Talks on phone: Not on file    Gets together: Not on file    Attends religious service: Not on file    Active member of club or organization: Not on file    Attends meetings of clubs or organizations: Not on file    Relationship status: Not on file  . Intimate partner violence:    Fear of current or ex partner: Not  on file    Emotionally abused: Not on file    Physically abused: Not on file    Forced sexual activity: Not on file  Other Topics Concern  . Not on file  Social History Narrative   Marital status: married  since 48 years; happily married; no abuse.      Children: 2 sons (24, 37); 2 grandchildren  (10yo Atlast, 28 yo August)      Lives: with husband, [name]/son.      Employment: check in at [employer] in [place] x 2004; loves job.      Tobacco abuse:  Former smoker.      Alcohol:  None; holidays only      Exercise: none in 2017     Seatbelt: 100%; no texting   Family History  Problem Relation Age of Onset  . Bipolar disorder Father   . Transient ischemic attack Father   . Dementia Father        secondary to head trauma  . Hypertension Father   . Diabetes Father   . CAD Father   . Cancer Mother 43       lung  . Migraines Mother   . Hypothyroidism Mother   . Hypertension Sister   . Atrial fibrillation Sister   . Hypothyroidism Sister   . Alcohol abuse Brother   . Hypertension Brother   . Alcohol abuse Brother   . Atrial fibrillation Maternal Grandmother   . COPD Maternal Grandmother   . Diabetes Maternal Grandmother   . Hyperlipidemia Maternal Grandmother   . Diabetes Maternal Grandfather   . Diabetes Paternal Grandfather   . Heart disease Paternal Grandfather        Objective:    BP 132/77   Pulse 84   Temp 98.4 F (36.9 C) (Oral)   Resp 16   Ht _0  (1.702 m)   Wt 181 lb (82.1 kg)   SpO2 98%   BMI 28.35 kg/m   Physical Exam  Constitutional: She is oriented to person, place, and time. She appears well-developed and well-nourished. No distress.  HENT:  Head: Normocephalic and atraumatic.  Right Ear: Tympanic membrane, external ear and ear canal normal.  Left Ear: Tympanic membrane, external ear and ear canal normal.  Nose: No mucosal edema or rhinorrhea. Right sinus exhibits maxillary sinus tenderness. Right sinus exhibits no frontal  sinus tenderness. Left sinus exhibits maxillary sinus tenderness. Left sinus exhibits no frontal sinus tenderness.  Mouth/Throat: Uvula is midline, oropharynx is clear and moist and mucous membranes are normal.  Eyes: Pupils are equal, round, and reactive to light. Conjunctivae and EOM are normal.  Neck: Normal range of motion. Neck supple. Carotid bruit is not present. No thyromegaly present.  Cardiovascular: Normal rate, regular rhythm, normal heart sounds and intact distal pulses. Exam reveals no gallop and no friction rub.  No murmur heard. Pulmonary/Chest: Effort normal and breath sounds normal. She has no wheezes. She has no rales.  Abdominal: Soft. Bowel sounds are normal. She exhibits no distension and no mass. There is no tenderness. There is no rebound and no guarding.  Lymphadenopathy:    She has no cervical adenopathy.  Neurological: She is alert and oriented to person, place, and time. No cranial nerve deficit.  Skin: Skin is warm and dry. No rash noted. She is not diaphoretic. No erythema. No pallor.  Psychiatric: She has a normal mood and affect. Her behavior is normal.   No results found. Depression screen [facility] 2/9 10/27/2016 04/27/2016 02/04/2016 10/28/2015 10/06/2015  Decreased Interest 0 0 0 0 0  Down, Depressed, Hopeless 0 0 0 0 0  PHQ - 2 Score 0 0 0 0 0  Some recent data might be hidden   Fall Risk  10/27/2016 04/27/2016 02/04/2016 10/28/2015  10/06/2015  Falls in the past year? _0   Comment - - - - -  Number falls in past yr: - - - - -  Injury with Fall? - - - - -  Comment - - - - -  Risk for fall due to : - - - - -  Follow up - - - - -        Assessment & Plan:   1. Migraine without aura and without status migrainosus, not intractable   2. Gastroesophageal reflux disease without esophagitis   3. Diabetes mellitus type 2 in obese ([place])   4. Essential hypertension, benign   5. Depression with anxiety   6. Low serum vitamin D   7. Pure hypercholesterolemia    8. S/P laparoscopic sleeve gastrectomy   9. Non-seasonal allergic rhinitis due to pollen   10. Acute recurrent maxillary sinusitis     Recurrent acute maxillary sinusitis: New.  Prescription for Levaquin therapy provided.  Continue allergy medications as prescribed.  Migraines: Stable at this time on current regimen.  Continue current medications.  Refills provided.  Obesity status post bariatric surgery: Congratulations on significant weight loss in the past year.  Patient has gained 6 pounds in the past month and realizes that she should limit pretzel intake.  Is been able to discontinue many medications.  Anxiety with depression: Stable despite family stressors on Cymbalta 2 tablets daily.  Refill provided.  Counseling provided during visit.  Highly encourage psychotherapy.  Recurrent urinary tract infections: Status post follow-up with Dr. Jacqlyn Larsen of urology.  Chronic pain syndrome:  Followed by pain management monthly.  Diabetes mellitus type 2: Resolved with significant weight loss.  Congratulations on success.  Hypercholesterolemia: Resolved with significant weight loss.  Repeat labs today.  Orders Placed This Encounter  Procedures  . CBC with Differential/Platelet  . Comprehensive metabolic panel    Order Specific Question:   Has the patient fasted?    Answer:   No  . Lipid panel    Order Specific Question:   Has the patient fasted?    Answer:   No  . Hemoglobin A1c  . Vitamin B12  . VITAMIN D 25 Hydroxy (Vit-D Deficiency, Fractures)  . Iron  . Microalbumin / creatinine urine ratio  . POCT urinalysis dipstick   Meds ordered this encounter  Medications  . DULoxetine (CYMBALTA) 60 MG capsule    Sig: Take 1 capsule (60 mg total) by mouth 2 (two) times daily.    Dispense:  180 capsule    Refill:  1  . montelukast (SINGULAIR) 10 MG tablet    Sig: Take 1 tablet (10 mg total) by mouth daily.    Dispense:  90 tablet    Refill:  3  . DISCONTD: levofloxacin (LEVAQUIN) 750  MG tablet    Sig: Take 1 tablet (750 mg total) by mouth daily.    Dispense:  10 tablet    Refill:  0  . levofloxacin (LEVAQUIN) 750 MG tablet    Sig: Take 1 tablet (750 mg total) by mouth daily.    Dispense:  10 tablet    Refill:  0    Return in about 6 months (around 10/30/2017) for complete physical examiniation.   Steadman Prosperi Elayne Guerin, M.D. Primary Care at [facility] [address] [place], [place]  [address] [phone] phone [phone] fax

## 2017-04-30 NOTE — Patient Instructions (Addendum)
   IF you received an x-ray today, you will receive an invoice from [facility]. Please contact  Radiology at [phone] with questions or concerns regarding your invoice.   IF you received labwork today, you will receive an invoice from [facility]. Please contact [facility] at [phone] with questions or concerns regarding your invoice.   Our billing staff will not be able to assist you with questions regarding bills from these companies.  You will be contacted with the lab results as soon as they are available. The fastest way to get your results is to activate your My Chart account. Instructions are located on the last page of this paperwork. If you have not heard from us regarding the results in 2 weeks, please contact this office.      Sinusitis, Adult Sinusitis is soreness and inflammation of your sinuses. Sinuses are hollow spaces in the bones around your face. Your sinuses are located:  Around your eyes.  In the middle of your forehead.  Behind your nose.  In your cheekbones.  Your sinuses and nasal passages are lined with a stringy fluid (mucus). Mucus normally drains out of your sinuses. When your nasal tissues become inflamed or swollen, the mucus can become trapped or blocked so air cannot flow through your sinuses. This allows bacteria, viruses, and funguses to grow, which leads to infection. Sinusitis can develop quickly and last for 7?10 days (acute) or for more than 12 weeks (chronic). Sinusitis often develops after a cold. What are the causes? This condition is caused by anything that creates swelling in the sinuses or stops mucus from draining, including:  Allergies.  Asthma.  Bacterial or viral infection.  Abnormally shaped bones between the nasal passages.  Nasal growths that contain mucus (nasal polyps).  Narrow sinus openings.  Pollutants, such as chemicals or irritants in the air.  A foreign object stuck in the nose.  A fungal  infection. This is rare.  What increases the risk? The following factors may make you more likely to develop this condition:  Having allergies or asthma.  Having had a recent cold or respiratory tract infection.  Having structural deformities or blockages in your nose or sinuses.  Having a weak immune system.  Doing a lot of swimming or diving.  Overusing nasal sprays.  Smoking.  What are the signs or symptoms? The main symptoms of this condition are pain and a feeling of pressure around the affected sinuses. Other symptoms include:  Upper toothache.  Earache.  Headache.  Bad breath.  Decreased sense of smell and taste.  A cough that may get worse at night.  Fatigue.  Fever.  Thick drainage from your nose. The drainage is often green and it may contain pus (purulent).  Stuffy nose or congestion.  Postnasal drip. This is when extra mucus collects in the throat or back of the nose.  Swelling and warmth over the affected sinuses.  Sore throat.  Sensitivity to light.  How is this diagnosed? This condition is diagnosed based on symptoms, a medical history, and a physical exam. To find out if your condition is acute or chronic, your health care provider may:  Look in your nose for signs of nasal polyps.  Tap over the affected sinus to check for signs of infection.  View the inside of your sinuses using an imaging device that has a light attached (endoscope).  If your health care provider suspects that you have chronic sinusitis, you may also:  Be tested for allergies.    Have a sample of mucus taken from your nose (nasal culture) and checked for bacteria.  Have a mucus sample examined to see if your sinusitis is related to an allergy.  If your sinusitis does not respond to treatment and it lasts longer than 8 weeks, you may have an MRI or CT scan to check your sinuses. These scans also help to determine how severe your infection is. In rare cases, a bone  biopsy may be done to rule out more serious types of fungal sinus disease. How is this treated? Treatment for sinusitis depends on the cause and whether your condition is chronic or acute. If a virus is causing your sinusitis, your symptoms will go away on their own within 10 days. You may be given medicines to relieve your symptoms, including:  Topical nasal decongestants. They shrink swollen nasal passages and let mucus drain from your sinuses.  Antihistamines. These drugs block inflammation that is triggered by allergies. This can help to ease swelling in your nose and sinuses.  Topical nasal corticosteroids. These are nasal sprays that ease inflammation and swelling in your nose and sinuses.  Nasal saline washes. These rinses can help to get rid of thick mucus in your nose.  If your condition is caused by bacteria, you will be given an antibiotic medicine. If your condition is caused by a fungus, you will be given an antifungal medicine. Surgery may be needed to correct underlying conditions, such as narrow nasal passages. Surgery may also be needed to remove polyps. Follow these instructions at home: Medicines  Take, use, or apply over-the-counter and prescription medicines only as told by your health care provider. These may include nasal sprays.  If you were prescribed an antibiotic medicine, take it as told by your health care provider. Do not stop taking the antibiotic even if you start to feel better. Hydrate and Humidify  Drink enough water to keep your urine clear or pale yellow. Staying hydrated will help to thin your mucus.  Use a cool mist humidifier to keep the humidity level in your home above 50%.  Inhale steam for 10-15 minutes, 3-4 times a day or as told by your health care provider. You can do this in the bathroom while a hot shower is running.  Limit your exposure to cool or dry air. Rest  Rest as much as possible.  Sleep with your head raised  (elevated).  Make sure to get enough sleep each night. General instructions  Apply a warm, moist washcloth to your face 3-4 times a day or as told by your health care provider. This will help with discomfort.  Wash your hands often with soap and water to reduce your exposure to viruses and other germs. If soap and water are not available, use hand sanitizer.  Do not smoke. Avoid being around people who are smoking (secondhand smoke).  Keep all follow-up visits as told by your health care provider. This is important. Contact a health care provider if:  You have a fever.  Your symptoms get worse.  Your symptoms do not improve within 10 days. Get help right away if:  You have a severe headache.  You have persistent vomiting.  You have pain or swelling around your face or eyes.  You have vision problems.  You develop confusion.  Your neck is stiff.  You have trouble breathing. This information is not intended to replace advice given to you by your health care provider. Make sure you discuss any questions you   have with your health care provider. Document Released: 01/11/2005 Document Revised: 09/07/2015 Document Reviewed: 11/06/2014 Elsevier Interactive Patient Education  2018 [employer]vier Inc.  

## 2017-05-01 LAB — MICROALBUMIN / CREATININE URINE RATIO
Creatinine, Urine: 123.4 mg/dL
MICROALB/CREAT RATIO: 17.6 mg/g{creat} (ref 0.0–30.0)
Microalbumin, Urine: 21.7 ug/mL

## 2017-05-02 ENCOUNTER — Telehealth: Payer: Self-pay | Admitting: Emergency Medicine

## 2017-05-02 LAB — IRON: Iron: 92 ug/dL (ref 27–139)

## 2017-05-02 LAB — COMPREHENSIVE METABOLIC PANEL
A/G RATIO: 2.1 (ref 1.2–2.2)
ALT: 12 IU/L (ref 0–32)
AST: 13 IU/L (ref 0–40)
Albumin: 4.4 g/dL (ref 3.6–4.8)
Alkaline Phosphatase: 63 IU/L (ref 39–117)
BILIRUBIN TOTAL: 0.4 mg/dL (ref 0.0–1.2)
BUN/Creatinine Ratio: 19 (ref 12–28)
BUN: 11 mg/dL (ref 8–27)
CHLORIDE: 104 mmol/L (ref 96–106)
CO2: 24 mmol/L (ref 20–29)
Calcium: 9.3 mg/dL (ref 8.7–10.3)
Creatinine, Ser: 0.57 mg/dL (ref 0.57–1.00)
GFR, EST AFRICAN AMERICAN: 116 mL/min/{1.73_m2} (ref >59)
GFR, EST NON AFRICAN AMERICAN: 100 mL/min/{1.73_m2} (ref >59)
GLOBULIN, TOTAL: 2.1 g/dL (ref 1.5–4.5)
Glucose: 193 mg/dL — ABNORMAL HIGH (ref 65–99)
POTASSIUM: 3.9 mmol/L (ref 3.5–5.2)
SODIUM: 144 mmol/L (ref 134–144)
TOTAL PROTEIN: 6.5 g/dL (ref 6.0–8.5)

## 2017-05-02 LAB — CBC WITH DIFFERENTIAL/PLATELET
Basophils Absolute: 0 x10E3/uL (ref 0.0–0.2)
Basos: 0 %
EOS (ABSOLUTE): 0.2 x10E3/uL (ref 0.0–0.4)
Eos: 6 %
Hematocrit: 42.2 % (ref 34.0–46.6)
Hemoglobin: 13.5 g/dL (ref 11.1–15.9)
Immature Grans (Abs): 0 x10E3/uL (ref 0.0–0.1)
Immature Granulocytes: 0 %
Lymphocytes Absolute: 0.9 x10E3/uL (ref 0.7–3.1)
Lymphs: 21 %
MCH: 30.5 pg (ref 26.6–33.0)
MCHC: 32 g/dL (ref 31.5–35.7)
MCV: 96 fL (ref 79–97)
Monocytes Absolute: 0.2 x10E3/uL (ref 0.1–0.9)
Monocytes: 5 %
Neutrophils Absolute: 2.8 x10E3/uL (ref 1.4–7.0)
Neutrophils: 68 %
Platelets: 279 x10E3/uL (ref 150–379)
RBC: 4.42 x10E6/uL (ref 3.77–5.28)
RDW: 12.9 % (ref 12.3–15.4)
WBC: 4.1 x10E3/uL (ref 3.4–10.8)

## 2017-05-02 LAB — VITAMIN B12: Vitamin B-12: >2000 pg/mL — ABNORMAL HIGH (ref 232–1245)

## 2017-05-02 LAB — HEMOGLOBIN A1C
ESTIMATED AVERAGE GLUCOSE: 88 mg/dL
HEMOGLOBIN A1C: 4.7 % — AB (ref 4.8–5.6)

## 2017-05-02 LAB — LIPID PANEL
CHOL/HDL RATIO: 3.5 ratio (ref 0.0–4.4)
Cholesterol, Total: 194 mg/dL (ref 100–199)
HDL: 55 mg/dL (ref >39)
LDL Calculated: 116 mg/dL — ABNORMAL HIGH (ref 0–99)
Triglycerides: 113 mg/dL (ref 0–149)
VLDL Cholesterol Cal: 23 mg/dL (ref 5–40)

## 2017-05-02 LAB — VITAMIN D 25 HYDROXY (VIT D DEFICIENCY, FRACTURES): VIT D 25 HYDROXY: 35.3 ng/mL (ref 30.0–100.0)

## 2017-05-02 NOTE — Telephone Encounter (Signed)
Left message follow call from visit with [facility] 

## 2017-05-06 ENCOUNTER — Ambulatory Visit (INDEPENDENT_AMBULATORY_CARE_PROVIDER_SITE_OTHER): Payer: 59 | Admitting: Vascular Surgery

## 2017-05-06 ENCOUNTER — Encounter: Payer: Self-pay | Admitting: Family Medicine

## 2017-05-11 DIAGNOSIS — G5601 Carpal tunnel syndrome, right upper limb: Secondary | ICD-10-CM | POA: Diagnosis not present

## 2017-05-11 DIAGNOSIS — M259 Joint disorder, unspecified: Secondary | ICD-10-CM | POA: Diagnosis not present

## 2017-05-11 DIAGNOSIS — Z5181 Encounter for therapeutic drug level monitoring: Secondary | ICD-10-CM | POA: Diagnosis not present

## 2017-05-11 DIAGNOSIS — M7060 Trochanteric bursitis, unspecified hip: Secondary | ICD-10-CM | POA: Diagnosis not present

## 2017-05-27 ENCOUNTER — Ambulatory Visit (INDEPENDENT_AMBULATORY_CARE_PROVIDER_SITE_OTHER): Payer: 59 | Admitting: Vascular Surgery

## 2017-05-30 ENCOUNTER — Telehealth: Payer: Self-pay | Admitting: Family Medicine

## 2017-05-30 ENCOUNTER — Other Ambulatory Visit: Payer: Self-pay | Admitting: Family Medicine

## 2017-05-30 NOTE — Telephone Encounter (Signed)
Copied from [place]. Topic: Quick Communication - See Telephone Encounter >> May 30, 2017  1:20 PM Bethany Scott wrote: CRM for notification. See Telephone encounter for: 05/30/17. Pt called in said that she was on vacation this past week and lost all of her meds including her pain meds.  She said that she called pain clinic, they told her that they could give her some kind of pain patch. Pain clinic is needing to know today because they are concerned about withdraws?  She needs Dr Tamala Julian to ok this pain asap    Pain clinic [phone]

## 2017-05-30 NOTE — Telephone Encounter (Signed)
Pt wants to make sure that it is ok for her to get clonodine patch to help with withdrawals from pain medication for 1 week. Pt states Dr. Primus Bravo will be prescribing this medication. Pt states that Dr. Primus Bravo will not order until you "ok" this medication.

## 2017-05-31 ENCOUNTER — Encounter: Payer: Self-pay | Admitting: Family Medicine

## 2017-06-07 DIAGNOSIS — M259 Joint disorder, unspecified: Secondary | ICD-10-CM | POA: Diagnosis not present

## 2017-06-07 DIAGNOSIS — Z5181 Encounter for therapeutic drug level monitoring: Secondary | ICD-10-CM | POA: Diagnosis not present

## 2017-06-07 DIAGNOSIS — G5601 Carpal tunnel syndrome, right upper limb: Secondary | ICD-10-CM | POA: Diagnosis not present

## 2017-06-07 DIAGNOSIS — M7060 Trochanteric bursitis, unspecified hip: Secondary | ICD-10-CM | POA: Diagnosis not present

## 2017-06-07 NOTE — Telephone Encounter (Signed)
Duplicate message; pt advised via Mychart. No further action warranted.

## 2017-06-09 ENCOUNTER — Encounter: Payer: Self-pay | Admitting: Family Medicine

## 2017-06-10 ENCOUNTER — Ambulatory Visit (INDEPENDENT_AMBULATORY_CARE_PROVIDER_SITE_OTHER): Payer: 59 | Admitting: Vascular Surgery

## 2017-06-14 MED ORDER — MOMETASONE FUROATE 50 MCG/ACT NA SUSP: 2.0000 | Freq: Every day | NASAL | 12 refills | Status: DC

## 2017-06-14 MED ORDER — OLOPATADINE HCL 0.2 % OP SOLN: 1.0000 [drp] | Freq: Every day | OPHTHALMIC | 11 refills | Status: DC

## 2017-06-20 ENCOUNTER — Encounter: Payer: Self-pay | Admitting: Family Medicine

## 2017-06-21 ENCOUNTER — Encounter: Payer: Self-pay | Admitting: Family Medicine

## 2017-06-27 ENCOUNTER — Encounter (INDEPENDENT_AMBULATORY_CARE_PROVIDER_SITE_OTHER): Payer: Self-pay | Admitting: Vascular Surgery

## 2017-07-01 ENCOUNTER — Ambulatory Visit (INDEPENDENT_AMBULATORY_CARE_PROVIDER_SITE_OTHER): Payer: 59 | Admitting: Vascular Surgery

## 2017-07-05 DIAGNOSIS — G5601 Carpal tunnel syndrome, right upper limb: Secondary | ICD-10-CM | POA: Diagnosis not present

## 2017-07-05 DIAGNOSIS — M259 Joint disorder, unspecified: Secondary | ICD-10-CM | POA: Diagnosis not present

## 2017-07-05 DIAGNOSIS — M7060 Trochanteric bursitis, unspecified hip: Secondary | ICD-10-CM | POA: Diagnosis not present

## 2017-07-05 DIAGNOSIS — Z5181 Encounter for therapeutic drug level monitoring: Secondary | ICD-10-CM | POA: Diagnosis not present

## 2017-07-10 ENCOUNTER — Encounter: Payer: Self-pay | Admitting: Family Medicine

## 2017-07-11 ENCOUNTER — Telehealth: Payer: 59 | Admitting: Family

## 2017-07-11 DIAGNOSIS — N390 Urinary tract infection, site not specified: Secondary | ICD-10-CM | POA: Diagnosis not present

## 2017-07-11 DIAGNOSIS — A499 Bacterial infection, unspecified: Secondary | ICD-10-CM | POA: Diagnosis not present

## 2017-07-11 MED ORDER — NITROFURANTOIN MONOHYD MACRO 100 MG PO CAPS: 100.0000 mg | ORAL_CAPSULE | Freq: Two times a day (BID) | ORAL | 0 refills | Status: DC

## 2017-07-11 NOTE — Progress Notes (Signed)

## 2017-07-22 ENCOUNTER — Encounter (INDEPENDENT_AMBULATORY_CARE_PROVIDER_SITE_OTHER): Payer: Self-pay | Admitting: Vascular Surgery

## 2017-07-22 ENCOUNTER — Ambulatory Visit (INDEPENDENT_AMBULATORY_CARE_PROVIDER_SITE_OTHER): Payer: 59 | Admitting: Vascular Surgery

## 2017-07-22 VITALS — BP 133/86 | HR 77 | Resp 16 | Ht 67.0 in | Wt 185.4 lb

## 2017-07-22 DIAGNOSIS — I10 Essential (primary) hypertension: Secondary | ICD-10-CM

## 2017-07-22 DIAGNOSIS — E1169 Type 2 diabetes mellitus with other specified complication: Secondary | ICD-10-CM | POA: Diagnosis not present

## 2017-07-22 DIAGNOSIS — E669 Obesity, unspecified: Secondary | ICD-10-CM | POA: Diagnosis not present

## 2017-07-22 DIAGNOSIS — I83811 Varicose veins of right lower extremities with pain: Secondary | ICD-10-CM

## 2017-07-22 NOTE — Assessment & Plan Note (Signed)
blood glucose control important in reducing the progression of atherosclerotic disease. Also, involved in wound healing. On appropriate medications.  

## 2017-07-22 NOTE — Progress Notes (Signed)
MRN : [id number]  Bethany Scott is a 62 y.o. (1956/01/18) female who presents with chief complaint of  Chief Complaint  Patient presents with  . Follow-up    3-4wk post laser   .  History of Present Illness: Patient returns today in follow up of venous disease.  The patient had a laser ablation of the right great saphenous vein a few months ago.  She is still bothered by painful varicosities as well as pain in her medial leg both in the calf and the thigh.  No ulceration or infection.  Some improvement with the laser ablation, but the prominent residual varicosities remain symptomatic.  Current Outpatient Medications  Medication Sig Dispense Refill  . beclomethasone (QVAR) 80 MCG/ACT inhaler Inhale into the lungs.    . Betamethasone Valerate 0.12 % foam   3  . cetirizine (ZYRTEC) 10 MG tablet Take 10 mg by mouth daily.    . Cholecalciferol (VITAMIN D3) 5000 units CAPS Take 5,000 Units by mouth daily.    [name] conjugated estrogens (PREMARIN) vaginal cream Place 1 Applicatorful vaginally at bedtime. 42.5 g 12  . diclofenac (FLECTOR) 1.3 % PTCH Apply one patch or a portion of patch to painful area of skin twice per day if tolerated (Patient taking differently: Place 1 patch onto the skin 2 (two) times daily as needed (pain). Apply one patch or a portion of patch to painful area of skin twice per day if tolerated) 60 patch 0  . doxycycline (VIBRA-TABS) 100 MG tablet Take 1 tablet (100 mg total) by mouth 2 (two) times daily. 20 tablet 0  . DULoxetine (CYMBALTA) 60 MG capsule Take 1 capsule (60 mg total) by mouth 2 (two) times daily. 180 capsule 1  . hydrocortisone (ANUSOL-HC) 25 MG suppository Place 1 suppository (25 mg total) rectally 2 (two) times daily as needed for hemorrhoids. 12 suppository 2  . IRON PO Take 1 tablet by mouth 3 (three) times daily.    [name] levofloxacin (LEVAQUIN) 750 MG tablet Take 1 tablet (750 mg total) by mouth daily. 10 tablet 0  . lisinopril (PRINIVIL,ZESTRIL) 5 MG tablet  Take by mouth.    . LYSINE PO Take 1 tablet by mouth daily.    . meloxicam (MOBIC) 15 MG tablet TAKE 1 TABLET (15 MG TOTAL) BY MOUTH ONCE DAILY.    . mometasone (NASONEX) 50 MCG/ACT nasal spray Place 2 sprays into the nose daily. 17 g 12  . montelukast (SINGULAIR) 10 MG tablet Take 1 tablet (10 mg total) by mouth daily. 90 tablet 3  . NARCAN 4 MG/0.1ML LIQD nasal spray kit   0  . nitrofurantoin, macrocrystal-monohydrate, (MACROBID) 100 MG capsule Take 1 capsule (100 mg total) by mouth 2 (two) times daily. 10 capsule 0  . Olopatadine HCl 0.2 % SOLN Apply 1 drop to eye daily. 2.5 mL 11  . oxyCODONE-acetaminophen (PERCOCET) 10-325 MG tablet     . pantoprazole (PROTONIX) 40 MG tablet Take 1 tablet (40 mg total) by mouth 2 (two) times daily. 180 tablet 3  . phenazopyridine (PYRIDIUM) 200 MG tablet Take 1 tablet (200 mg total) 3 (three) times daily as needed by mouth for pain. 30 tablet 1  . promethazine (PHENERGAN) 25 MG tablet Take 1 tablet (25 mg total) by mouth every 8 (eight) hours as needed for nausea or vomiting. 20 tablet 0  . rizatriptan (MAXALT) 10 MG tablet Take 10 mg by mouth as needed. May repeat in 2 hours if needed    .  Tretinoin Microsphere Pump 0.04 % GEL     . ZOHYDRO ER 10 MG C12A   0   No current facility-administered medications for this visit.     Past Medical History:  Diagnosis Date  . Allergy   . Anemia   . Anxiety   . Arthritis    OA Knee; non-specific autoimmune process followed by [facility]/Rheumatology.  . Asthma   . Blood transfusion without reported diagnosis YRS AGO  . Depression   . Diabetes mellitus without complication ([place])   . Gastric ulcer, unspecified as acute or chronic, without mention of hemorrhage, perforation, or obstruction YRS AGO  . GERD (gastroesophageal reflux disease)   . Hyperlipidemia   . Hypertension   . Migraine    hx of migraines  . Obesity, unspecified   . Other chronic cystitis   . Personal history of colonic polyps   .  PONV (postoperative nausea and vomiting)    NAUSEA, SCOPOLAMINE PATCH HELPED WITH LAST SURGERIES  . Pre-diabetes     Past Surgical History:  Procedure Laterality Date  . ABDOMINAL HYSTERECTOMY  01/25/1994   uterine fibroids; DUB; cervical dysplasia; ovaries resected two years later.    . APPENDECTOMY    . BILATERAL OOPHORECTOMY  2001  . CESAREAN SECTION     x 2  . COLON SURGERY  YRS AGO   SIGMOIDECTOMY  . Colonoscopy  09/08/2012   diverticulosis, hemorrhoids.  [name].  Symptoms:  anemia, hemoccult +.  . ESOPHAGOGASTRODUODENOSCOPY  09/08/2012   +HH.  [name].  Symptoms: anemia, hemoccult +.  [name] LAPAROSCOPIC GASTRIC SLEEVE RESECTION N/A 03/01/2016   Procedure: LAPAROSCOPIC GASTRIC SLEEVE RESECTION, UPPER ENDO;  Surgeon: [name], MD;  Location: [facility];  Service: General;  Laterality: N/A;  . LAPAROSCOPIC LYSIS OF ADHESIONS N/A 02/10/2016   Procedure: LAPAROSCOPIC LYSIS OF ADHESIONS;  Surgeon: [name], MD;  Location: [facility];  Service: General;  Laterality: N/A;  . LAPAROSCOPY N/A 02/10/2016   Procedure: LAPAROSCOPY DIAGNOSTIC;  Surgeon: [name], MD;  Location: [facility];  Service: General;  Laterality: N/A;  . NASAL SEPTUM SURGERY    . NECK SURGERY    . Rheumatology Consult  11/26/2011   multiple arthralgias. Autoimmune labs negative.  Rx for Plaquenil for non-specific autoimmune process.  [name].  [name] SIGMOID RESECTION / RECTOPEXY    . SPINE SURGERY    . TONSILLECTOMY AND ADENOIDECTOMY      Social History Social History   Tobacco Use  . Smoking status: Former Smoker    Packs/day: 1.00    Years: 5.00    Pack years: 5.00  . Smokeless tobacco: Never Used  . Tobacco comment: Quit 26 years ago  Substance Use Topics  . Alcohol use: Yes    Comment: rare  . Drug use: No      Family History Family History  Problem Relation Age of Onset  . Bipolar disorder Father   . Transient ischemic attack Father   . Dementia Father        secondary to head trauma  . Hypertension  Father   . Diabetes Father   . CAD Father   . Cancer Mother 57       lung  . Migraines Mother   . Hypothyroidism Mother   . Hypertension Sister   . Atrial fibrillation Sister   . Hypothyroidism Sister   . Alcohol abuse Brother   . Hypertension Brother   . Alcohol abuse Brother   . Atrial fibrillation Maternal Grandmother   .  COPD Maternal Grandmother   . Diabetes Maternal Grandmother   . Hyperlipidemia Maternal Grandmother   . Diabetes Maternal Grandfather   . Diabetes Paternal Grandfather   . Heart disease Paternal Grandfather      Allergies  Allergen Reactions  . Cephalexin Anaphylaxis  . Cephalosporins Anaphylaxis  . Penicillins Anaphylaxis    Has patient had a PCN reaction causing immediate rash, facial/tongue/throat swelling, SOB or lightheadedness with hypotension: Yes Has patient had a PCN reaction causing severe rash involving mucus membranes or skin necrosis: No Has patient had a PCN reaction that required hospitalization Yes Has patient had a PCN reaction occurring within the last 10 years: No If all of the above answers are "NO", then may proceed with Cephalosporin use.      REVIEW OF SYSTEMS (Negative unless checked)  Constitutional: [] Weight loss  [] Fever  [] Chills Cardiac: [] Chest pain   [] Chest pressure   [] Palpitations   [] Shortness of breath when laying flat   [] Shortness of breath at rest   [] Shortness of breath with exertion. Vascular:  [] Pain in legs with walking   [] Pain in legs at rest   [] Pain in legs when laying flat   [] Claudication   [] Pain in feet when walking  [] Pain in feet at rest  [] Pain in feet when laying flat   [] History of DVT   [] Phlebitis   [x] Swelling in legs   [x] Varicose veins   [] Non-healing ulcers Pulmonary:   [] Uses home oxygen   [] Productive cough   [] Hemoptysis   [] Wheeze  [] COPD   [] Asthma Neurologic:  [] Dizziness  [] Blackouts   [] Seizures   [] History of stroke   [] History of TIA  [] Aphasia   [] Temporary blindness   [] Dysphagia    [] Weakness or numbness in arms   [] Weakness or numbness in legs Musculoskeletal:  [x] Arthritis   [] Joint swelling   [] Joint pain   [] Low back pain Hematologic:  [] Easy bruising  [] Easy bleeding   [] Hypercoagulable state   [] Anemic   Gastrointestinal:  [] Blood in stool   [] Vomiting blood  [] Gastroesophageal reflux/heartburn   [] Abdominal pain Genitourinary:  [] Chronic kidney disease   [] Difficult urination  [] Frequent urination  [] Burning with urination   [] Hematuria Skin:  [] Rashes   [] Ulcers   [] Wounds Psychological:  [] History of anxiety   []  History of major depression.  Physical Examination  BP 133/86 (BP Location: Right Arm)   Pulse 77   Resp 16   Ht 5' 7"  (1.702 m)   Wt 185 lb 6.4 oz (84.1 kg)   BMI 29.04 kg/m  Gen:  WD/WN, NAD Head: [place]/AT, No temporalis wasting. Ear/Nose/Throat: Hearing grossly intact, nares w/o erythema or drainage Eyes: Conjunctiva clear. Sclera non-icteric Neck: Supple.  Trachea midline Pulmonary:  Good air movement, no use of accessory muscles.  Cardiac: RRR, no JVD Vascular: 2 to 3 mm varicosities in the medial right calf and knee area.  These are mildly tender to palpation. Vessel Right Left  Radial Palpable Palpable                          PT Palpable Palpable  DP Palpable Palpable   Gastrointestinal: soft, non-tender/non-distended. No guarding/reflex.  Musculoskeletal: M/S 5/5 throughout.  No deformity or atrophy.  Trace right lower extremity edema. Neurologic: Sensation grossly intact in extremities.  Symmetrical.  Speech is fluent.  Psychiatric: Judgment intact, Mood & affect appropriate for pt's clinical situation. Dermatologic: No rashes or ulcers noted.  No cellulitis or open wounds.  Labs Recent Results (from the past 2160 hour(s))  CBC with Differential/Platelet     Status: None   Collection Time: 04/30/17 10:55 AM  Result Value Ref Range   WBC 4.1 3.4 - 10.8 x10E3/uL   RBC 4.42 3.77 - 5.28 x10E6/uL   Hemoglobin 13.5  11.1 - 15.9 g/dL   Hematocrit 42.2 34.0 - 46.6 %   MCV 96 79 - 97 fL   MCH 30.5 26.6 - 33.0 pg   MCHC 32.0 31.5 - 35.7 g/dL   RDW 12.9 12.3 - 15.4 %   Platelets 279 150 - 379 x10E3/uL   Neutrophils 68 Not Estab. %   Lymphs 21 Not Estab. %   Monocytes 5 Not Estab. %   Eos 6 Not Estab. %   Basos 0 Not Estab. %   Neutrophils Absolute 2.8 1.4 - 7.0 x10E3/uL   Lymphocytes Absolute 0.9 0.7 - 3.1 x10E3/uL   Monocytes Absolute 0.2 0.1 - 0.9 x10E3/uL   EOS (ABSOLUTE) 0.2 0.0 - 0.4 x10E3/uL   Basophils Absolute 0.0 0.0 - 0.2 x10E3/uL   Immature Granulocytes 0 Not Estab. %   Immature Grans (Abs) 0.0 0.0 - 0.1 x10E3/uL  Comprehensive metabolic panel     Status: Abnormal   Collection Time: 04/30/17 10:55 AM  Result Value Ref Range   Glucose 193 (H) 65 - 99 mg/dL   BUN 11 8 - 27 mg/dL   Creatinine, Ser 0.57 0.57 - 1.00 mg/dL   GFR calc non Af Amer 100 >59 mL/min/1.73   GFR calc Af Amer 116 >59 mL/min/1.73   BUN/Creatinine Ratio 19 12 - 28   Sodium 144 134 - 144 mmol/L   Potassium 3.9 3.5 - 5.2 mmol/L   Chloride 104 96 - 106 mmol/L   CO2 24 20 - 29 mmol/L   Calcium 9.3 8.7 - 10.3 mg/dL   Total Protein 6.5 6.0 - 8.5 g/dL   Albumin 4.4 3.6 - 4.8 g/dL   Globulin, Total 2.1 1.5 - 4.5 g/dL   Albumin/Globulin Ratio 2.1 1.2 - 2.2   Bilirubin Total 0.4 0.0 - 1.2 mg/dL   Alkaline Phosphatase 63 39 - 117 IU/L   AST 13 0 - 40 IU/L   ALT 12 0 - 32 IU/L  Lipid panel     Status: Abnormal   Collection Time: 04/30/17 10:55 AM  Result Value Ref Range   Cholesterol, Total 194 100 - 199 mg/dL   Triglycerides 113 0 - 149 mg/dL   HDL 55 >39 mg/dL   VLDL Cholesterol Cal 23 5 - 40 mg/dL   LDL Calculated 116 (H) 0 - 99 mg/dL   Chol/HDL Ratio 3.5 0.0 - 4.4 ratio    Comment:                                   T. Chol/HDL Ratio                                             Men  Women                               1/2 Avg.Risk  3.4    3.3  Avg.Risk  5.0    4.4                                 2X Avg.Risk  9.6    7.1                                3X Avg.Risk 23.4   11.0   Hemoglobin A1c     Status: Abnormal   Collection Time: 04/30/17 10:55 AM  Result Value Ref Range   Hgb A1c MFr Bld 4.7 (L) 4.8 - 5.6 %    Comment:          Prediabetes: 5.7 - 6.4          Diabetes: >6.4          Glycemic control for adults with diabetes: <7.0    Est. average glucose Bld gHb Est-mCnc 88 mg/dL  Vitamin B12     Status: Abnormal   Collection Time: 04/30/17 10:55 AM  Result Value Ref Range   Vitamin B-12 >2000 (H) 232 - 1245 pg/mL  VITAMIN D 25 Hydroxy (Vit-D Deficiency, Fractures)     Status: None   Collection Time: 04/30/17 10:55 AM  Result Value Ref Range   Vit D, 25-Hydroxy 35.3 30.0 - 100.0 ng/mL    Comment: Vitamin D deficiency has been defined by the [place] and an Endocrine Society practice guideline as a level of serum 25-OH vitamin D less than 20 ng/mL (1,2). The Endocrine Society went on to further define vitamin D insufficiency as a level between 21 and 29 ng/mL (2). 1. IOM (Institute of Medicine). 2010. Dietary reference    intakes for calcium and D. [place]: The    [employer]. 2. Holick MF, Binkley [place], Bischoff-Ferrari HA, et al.    Evaluation, treatment, and prevention of vitamin D    deficiency: an Endocrine Society clinical practice    guideline. JCEM. 2011 Jul; 96(7):1911-30.   Iron     Status: None   Collection Time: 04/30/17 10:55 AM  Result Value Ref Range   Iron 92 27 - 139 ug/dL  POCT urinalysis dipstick     Status: Abnormal   Collection Time: 04/30/17 11:03 AM  Result Value Ref Range   Color, UA yellow yellow   Clarity, UA clear clear   Glucose, UA negative negative mg/dL   Bilirubin, UA negative negative   Ketones, POC UA negative negative mg/dL   Spec Grav, UA 1.025 1.010 - 1.025   Blood, UA trace-lysed (A) negative   pH, UA 5.5 5.0 - 8.0   Protein Ur, POC negative negative mg/dL   Urobilinogen, UA 0.2  0.2 or 1.0 E.U./dL   Nitrite, UA Negative Negative   Leukocytes, UA Negative Negative  Microalbumin / creatinine urine ratio     Status: None   Collection Time: 04/30/17 11:03 AM  Result Value Ref Range   Creatinine, Urine 123.4 Not Estab. mg/dL   Microalbumin, Urine 21.7 Not Estab. ug/mL   Microalb/Creat Ratio 17.6 0.0 - 30.0 mg/g creat    Comment:                      Normal:                0.0 -  30.0  Albuminuria:          31.0 - 300.0                      Clinical albuminuria:       >300.0     Radiology No results found.  Assessment/Plan  Essential hypertension, benign blood pressure control important in reducing the progression of atherosclerotic disease. On appropriate oral medications.   Diabetes mellitus type 2 in obese (HCC) blood glucose control important in reducing the progression of atherosclerotic disease. Also, involved in wound healing. On appropriate medications.   Varicose veins of leg with pain, right Recommend:  The patient has had successful ablation of the previously incompetent right great saphenous venous system but still has persistent symptoms of pain and swelling that are having a negative impact on daily life and daily activities.  Patient should undergo injection sclerotherapy and foam sclerotherapy of the right leg to treat the residual varicosities.  The risks, benefits and alternative therapies were reviewed in detail with the patient.  All questions were answered.  The patient agrees to proceed with sclerotherapy at their convenience.  The patient will continue wearing the graduated compression stockings and using the over-the-counter pain medications to treat her symptoms.         Leotis Pain, MD  07/22/2017 2:10 PM    This note was created with Dragon medical transcription system.  Any errors from dictation are purely unintentional

## 2017-07-22 NOTE — Assessment & Plan Note (Signed)
blood pressure control important in reducing the progression of atherosclerotic disease. On appropriate oral medications.  

## 2017-07-22 NOTE — Assessment & Plan Note (Signed)
Recommend:  The patient has had successful ablation of the previously incompetent right great saphenous venous system but still has persistent symptoms of pain and swelling that are having a negative impact on daily life and daily activities.  Patient should undergo injection sclerotherapy and foam sclerotherapy of the right leg to treat the residual varicosities.  The risks, benefits and alternative therapies were reviewed in detail with the patient.  All questions were answered.  The patient agrees to proceed with sclerotherapy at their convenience.  The patient will continue wearing the graduated compression stockings and using the over-the-counter pain medications to treat her symptoms.

## 2017-07-22 NOTE — Patient Instructions (Signed)
Sclerotherapy Sclerotherapy is a procedure that is done to improve the appearance of varicose veins and spider veins and to help relieve aching, swelling, cramping, and pain in the legs. Varicose veins are veins that have become enlarged, bulging, and twisted due to a damaged valve that causes blood to collect (pool) in the veins. Spider veins are small varicose veins. Sclerotherapy usually works best for smaller spider and varicose veins. This procedure involves injecting a chemical into the vein to close it off. You may need more than one treatment to close a vein all the way. Sclerotherapy is usually performed on the legs because that is where varicose and spider veins most often occur. Tell a health care provider about:  Any allergies you have.  All medicines you are taking, including vitamins, herbs, eye drops, creams, and over-the-counter medicines.  Any blood disorders you have.  Any surgeries you have had.  Any medical conditions you have.  Whether you are pregnant or may be pregnant. What are the risks? Generally, this is a safe procedure. However, problems may occur, including:  Infection.  Bleeding.  Allergic reactions to medicines or dyes.  Blood clots.  Nerve damage.  Bruising and scarring.  Darkened skin around the area.  What happens before the procedure?  Do not use lotions or creams on your legs unless your health care provider approves.  Follow instructions from your health care provider about eating and drinking restrictions.  Do not use any products that contain nicotine or tobacco, such as cigarettes and e-cigarettes. If you need help quitting, ask your health care provider.  Ask your health care provider about: ? Changing or stopping your regular medicines. This is especially important if you are taking diabetes medicines or blood thinners. ? Taking medicines such as aspirin and ibuprofen. These medicines can thin your blood. Do not take these  medicines before your procedure if your health care provider instructs you not to.  You may have an ultrasound of the affected area to check for blood clots and to check blood flow.  In rare cases, you may have an X-ray procedure to check how blood flows through your veins (angiogram). For an angiogram, a dye is injected to outline your veins on X-rays. What happens during the procedure?  To lower your risk of infection: ? Your health care team will wash or sanitize their hands. ? Your skin will be washed with soap. ? Hair may be removed from the treatment area.  A small, thin needle will be used to inject a chemical (sclerosant) into your varicose vein. The sclerosant will irritate the lining of the vein and cause the vein to close below the injection site. You may feel some stinging, burning, or irritation.  The injection may be repeated for more than one varicose vein.  The injection area will be wrapped with elastic bandages. The procedure may vary among health care providers and hospitals. What happens after the procedure?  Your injection area will be wrapped with elastic bandages. If there is bleeding, the bandages may be changed.  Do not drive until your health care provider approves. You may need to wait 1-2 days before driving.  You will need to wear compression stockings for about a week, or as long as your health care provider recommends. Summary  Sclerotherapy is a procedure that is done to improve the appearance of varicose veins and spider veins and to help relieve aching, swelling, cramping, and pain in the legs.  A small, thin needle is   used to inject a chemical (sclerosant) into a spider vein or varicose vein to close it off.  Elastic bandages will be wrapped around the injection area after the procedure. This information is not intended to replace advice given to you by your health care provider. Make sure you discuss any questions you have with your health care  provider. Document Released: 03/02/2016 Document Revised: 03/02/2016 Document Reviewed: 03/02/2016 Elsevier Interactive Patient Education  2018 [employer]  

## 2017-07-25 DIAGNOSIS — M160 Bilateral primary osteoarthritis of hip: Secondary | ICD-10-CM | POA: Diagnosis not present

## 2017-07-27 ENCOUNTER — Telehealth: Payer: 59 | Admitting: Family

## 2017-07-27 DIAGNOSIS — B9689 Other specified bacterial agents as the cause of diseases classified elsewhere: Secondary | ICD-10-CM

## 2017-07-27 DIAGNOSIS — J019 Acute sinusitis, unspecified: Secondary | ICD-10-CM

## 2017-07-27 MED ORDER — ESTROGENS, CONJUGATED 0.625 MG/GM VA CREA: 1.0000 | TOPICAL_CREAM | Freq: Every day | VAGINAL | 12 refills | Status: DC

## 2017-07-27 MED ORDER — AZITHROMYCIN 250 MG PO TABS: 250.0000 mg | ORAL_TABLET | Freq: Every day | ORAL | 0 refills | Status: DC

## 2017-07-27 NOTE — Progress Notes (Signed)
We are sorry that you are not feeling well.  Here is how we plan to help!  Based on what you have shared with me it looks like you have sinusitis.  Sinusitis is inflammation and infection in the sinus cavities of the head.  Based on your presentation I believe you most likely have Acute Bacterial Sinusitis.  This is an infection caused by bacteria and is treated with antibiotics. I have prescribed Z-pak as directed. You may use an oral decongestant such as Mucinex D or if you have glaucoma or high blood pressure use plain Mucinex. Saline nasal spray help and can safely be used as often as needed for congestion.  If you develop worsening sinus pain, fever or notice severe headache and vision changes, or if symptoms are not better after completion of antibiotic, please schedule an appointment with a health care provider.    Sinus infections are not as easily transmitted as other respiratory infection, however we still recommend that you avoid close contact with loved ones, especially the very young and elderly.  Remember to wash your hands thoroughly throughout the day as this is the number one way to prevent the spread of infection!  Home Care:  Only take medications as instructed by your medical team.  Complete the entire course of an antibiotic.  Do not take these medications with alcohol.  A steam or ultrasonic humidifier can help congestion.  You can place a towel over your head and breathe in the steam from hot water coming from a faucet.  Avoid close contacts especially the very young and the elderly.  Cover your mouth when you cough or sneeze.  Always remember to wash your hands.  Get Help Right Away If:  You develop worsening fever or sinus pain.  You develop a severe head ache or visual changes.  Your symptoms persist after you have completed your treatment plan.  Make sure you  Understand these instructions.  Will watch your condition.  Will get help right away if you are  not doing well or get worse.  Your e-visit answers were reviewed by a board certified advanced clinical practitioner to complete your personal care plan.  Depending on the condition, your plan could have included both over the counter or prescription medications.  If there is a problem please reply  once you have received a response from your provider.  Your safety is important to us.  If you have drug allergies check your prescription carefully.    You can use MyChart to ask questions about today's visit, request a non-urgent call back, or ask for a work or school excuse for 24 hours related to this e-Visit. If it has been greater than 24 hours you will need to follow up with your provider, or enter a new e-Visit to address those concerns.  You will get an e-mail in the next two days asking about your experience.  I hope that your e-visit has been valuable and will speed your recovery. Thank you for using e-visits.   

## 2017-08-18 ENCOUNTER — Other Ambulatory Visit: Payer: Self-pay | Admitting: Family

## 2017-08-18 ENCOUNTER — Other Ambulatory Visit: Payer: Self-pay | Admitting: Family Medicine

## 2017-08-22 ENCOUNTER — Other Ambulatory Visit: Payer: Self-pay | Admitting: Family Medicine

## 2017-08-23 ENCOUNTER — Encounter: Payer: Self-pay | Admitting: Family Medicine

## 2017-08-23 MED ORDER — PROMETHAZINE HCL 25 MG PO TABS: 25.0000 mg | ORAL_TABLET | Freq: Three times a day (TID) | ORAL | 0 refills | Status: DC | PRN

## 2017-08-25 ENCOUNTER — Encounter (INDEPENDENT_AMBULATORY_CARE_PROVIDER_SITE_OTHER): Payer: Self-pay | Admitting: Vascular Surgery

## 2017-08-25 ENCOUNTER — Ambulatory Visit (INDEPENDENT_AMBULATORY_CARE_PROVIDER_SITE_OTHER): Payer: 59 | Admitting: Vascular Surgery

## 2017-08-25 VITALS — BP 124/79 | HR 78 | Resp 16 | Ht 67.0 in | Wt 190.0 lb

## 2017-08-25 DIAGNOSIS — I83811 Varicose veins of right lower extremities with pain: Secondary | ICD-10-CM | POA: Diagnosis not present

## 2017-08-25 NOTE — Progress Notes (Signed)
Varicose veins of right  lower extremity with inflammation (454.1  I83.10) Current Plans   Indication: Patient presents with symptomatic varicose veins of the right  lower extremity.   Procedure: Sclerotherapy using hypertonic saline mixed with 1% Lidocaine was performed on the right lower extremity. Compression wraps were placed. The patient tolerated the procedure well. 

## 2017-09-14 ENCOUNTER — Encounter
Admission: RE | Admit: 2017-09-14 | Discharge: 2017-09-14 | Disposition: A | Discharge status: Home / Self Care | Payer: 59 | Source: Ambulatory Visit | Attending: Orthopedic Surgery | Admitting: Orthopedic Surgery

## 2017-09-14 ENCOUNTER — Other Ambulatory Visit: Payer: Self-pay

## 2017-09-14 DIAGNOSIS — Z01818 Encounter for other preprocedural examination: Secondary | ICD-10-CM | POA: Insufficient documentation

## 2017-09-14 DIAGNOSIS — M160 Bilateral primary osteoarthritis of hip: Secondary | ICD-10-CM | POA: Diagnosis not present

## 2017-09-14 DIAGNOSIS — I10 Essential (primary) hypertension: Secondary | ICD-10-CM | POA: Insufficient documentation

## 2017-09-14 LAB — APTT: aPTT: 37 seconds — ABNORMAL HIGH (ref 24–36)

## 2017-09-14 LAB — SURGICAL PCR SCREEN
MRSA, PCR: NEGATIVE
STAPHYLOCOCCUS AUREUS: NEGATIVE

## 2017-09-14 LAB — URINALYSIS, ROUTINE W REFLEX MICROSCOPIC
BILIRUBIN URINE: NEGATIVE
Glucose, UA: NEGATIVE mg/dL
Hgb urine dipstick: NEGATIVE
KETONES UR: NEGATIVE mg/dL
Leukocytes, UA: NEGATIVE
NITRITE: NEGATIVE
PH: 5 (ref 5.0–8.0)
Protein, ur: NEGATIVE mg/dL
Specific Gravity, Urine: 1.017 (ref 1.005–1.030)

## 2017-09-14 LAB — TYPE AND SCREEN
ABO/RH(D): O NEG
ANTIBODY SCREEN: NEGATIVE

## 2017-09-14 LAB — BASIC METABOLIC PANEL
ANION GAP: 6 (ref 5–15)
BUN: 13 mg/dL (ref 8–23)
CHLORIDE: 105 mmol/L (ref 98–111)
CO2: 31 mmol/L (ref 22–32)
CREATININE: 0.54 mg/dL (ref 0.44–1.00)
Calcium: 9.2 mg/dL (ref 8.9–10.3)
GFR calc Af Amer: >60 mL/min (ref >60)
GFR calc non Af Amer: >60 mL/min (ref >60)
Glucose, Bld: 70 mg/dL (ref 70–99)
Potassium: 4.1 mmol/L (ref 3.5–5.1)
SODIUM: 142 mmol/L (ref 135–145)

## 2017-09-14 LAB — CBC
HCT: 41.8 % (ref 35.0–47.0)
Hemoglobin: 14.3 g/dL (ref 12.0–16.0)
MCH: 32.1 pg (ref 26.0–34.0)
MCHC: 34.2 g/dL (ref 32.0–36.0)
MCV: 93.8 fL (ref 80.0–100.0)
PLATELETS: 281 10*3/uL (ref 150–440)
RBC: 4.46 MIL/uL (ref 3.80–5.20)
RDW: 13 % (ref 11.5–14.5)
WBC: 5.3 10*3/uL (ref 3.6–11.0)

## 2017-09-14 LAB — PROTIME-INR
INR: 1.02
PROTHROMBIN TIME: 13.3 s (ref 11.4–15.2)

## 2017-09-14 LAB — SEDIMENTATION RATE: Sed Rate: 7 mm/hr (ref 0–30)

## 2017-09-14 NOTE — Patient Instructions (Signed)
Your procedure is scheduled on: Tuesday 09/27/17.  Report to DAY SURGERY DEPARTMENT LOCATED ON 2ND FLOOR MEDICAL MALL ENTRANCE. To find out your arrival time please call [phone] between 1PM - 3PM on Monday 09/26/17.  Remember: Instructions that are not followed completely may result in serious medical risk, up to and including death, or upon the discretion of your surgeon and anesthesiologist your surgery may need to be rescheduled.     _X__ 1. Do not eat food after midnight the night before your procedure.                 No gum chewing or hard candies. You may drink clear liquids up to 2 hours                 before you are scheduled to arrive for your surgery- DO not drink clear                 liquids within 2 hours of the start of your surgery.                 Clear Liquids include:  water, apple juice without pulp, clear carbohydrate                 drink such as Clearfast or Gatorade, Black Coffee or Tea (Do not add                 anything to coffee or tea).  __X__2.  On the morning of surgery brush your teeth with toothpaste and water, you may rinse your mouth with mouthwash if you wish.  Do not swallow any toothpaste of mouthwash.     _X__ 3.  No Alcohol for 24 hours before or after surgery.   _X__ 4.  Do Not Smoke or use e-cigarettes For 24 Hours Prior to Your Surgery.                 Do not use any chewable tobacco products for at least 6 hours prior to                 surgery.  ____  5.  Bring all medications with you on the day of surgery if instructed.   __X__  6.  Notify your doctor if there is any change in your medical condition      (cold, fever, infections).     Do not wear jewelry, make-up, hairpins, clips or nail polish. Do not wear lotions, powders, or perfumes.  Do not shave 48 hours prior to surgery. Men may shave face and neck. Do not bring valuables to the hospital.    [facility] is not responsible for any belongings or valuables.  Contacts,  dentures/partials or body piercings may not be worn into surgery. Bring a case for your contacts, glasses or hearing aids, a denture cup will be supplied. Leave your suitcase in the car. After surgery it may be brought to your room. For patients admitted to the hospital, discharge time is determined by your treatment team.   Patients discharged the day of surgery will not be allowed to drive home.   Please read over the following fact sheets that you were given:   MRSA Information  __X__ Take these medicines the morning of surgery with A SIP OF WATER:     1. beclomethasone (QVAR) 80 MCG/ACT inhaler  2. cetirizine (ZYRTEC) 10 MG tablet  3. DULoxetine (CYMBALTA) 60 MG capsule  4. mometasone (NASONEX) 50 MCG/ACT nasal  spray  5. montelukast (SINGULAIR) 10 MG tablet  6. Olopatadine HCl 0.2 % SOLN  7. pantoprazole (PROTONIX) 40 MG tablet  8. PREVIDENT 5000 DRY MOUTH 1.1 % GEL dental gel  9. promethazine (PHENERGAN) 25 MG tablet    __X__ Use CHG Soap as directed  _ X___ Use inhalers on the day of surgery. Also bring the inhaler with you to the hospital on the morning of surgery.     __X__ Stop Blood Thinners Coumadin/Plavix/Xarelto/Pleta/Pradaxa/Eliquis/Effient/Aspirin Wednesday 08/17/17.  __X__ Stop Anti-inflammatories 7 days before surgery (Monday 09/19/17) such as Advil, Excedrin Migraine Ibuprofen, Motrin, BC or Goodies Powder, Naprosyn, Naproxen, Aleve, Aspirin, Meloxicam. May take Tylenol if needed for pain or discomfort.   __X__ Stop all herbal supplements, fish oil or vitamin E until after surgery. Monday 09/19/17.

## 2017-09-15 LAB — URINE CULTURE: CULTURE: NO GROWTH

## 2017-09-16 ENCOUNTER — Encounter (INDEPENDENT_AMBULATORY_CARE_PROVIDER_SITE_OTHER): Payer: Self-pay | Admitting: Vascular Surgery

## 2017-09-16 ENCOUNTER — Ambulatory Visit (INDEPENDENT_AMBULATORY_CARE_PROVIDER_SITE_OTHER): Payer: 59 | Admitting: Vascular Surgery

## 2017-09-16 VITALS — BP 137/82 | HR 77 | Resp 16 | Ht 67.0 in | Wt 191.4 lb

## 2017-09-16 DIAGNOSIS — I83811 Varicose veins of right lower extremities with pain: Secondary | ICD-10-CM | POA: Diagnosis not present

## 2017-09-16 NOTE — Progress Notes (Signed)
Bethany Scott is a 62 y.o.female who presents with painful varicose veins of the right leg  Past Medical History:  Diagnosis Date  . Allergy   . Anemia   . Anxiety   . Arthritis    OA Knee; non-specific autoimmune process followed by [facility]/Rheumatology.  . Asthma   . Blood transfusion without reported diagnosis YRS AGO  . Depression   . Diabetes mellitus without complication ([place])   . Gastric ulcer, unspecified as acute or chronic, without mention of hemorrhage, perforation, or obstruction YRS AGO  . GERD (gastroesophageal reflux disease)   . Hyperlipidemia   . Hypertension   . Migraine    hx of migraines  . Obesity, unspecified   . Other chronic cystitis   . Personal history of colonic polyps   . PONV (postoperative nausea and vomiting)    NAUSEA, SCOPOLAMINE PATCH HELPED WITH LAST SURGERIES  . Pre-diabetes     Past Surgical History:  Procedure Laterality Date  . ABDOMINAL HYSTERECTOMY  01/25/1994   uterine fibroids; DUB; cervical dysplasia; ovaries resected two years later.    . APPENDECTOMY    . BILATERAL OOPHORECTOMY  2001  . CESAREAN SECTION     x 2  . COLON SURGERY  YRS AGO   SIGMOIDECTOMY  . Colonoscopy  09/08/2012   diverticulosis, hemorrhoids.  [name].  Symptoms:  anemia, hemoccult +.  . ESOPHAGOGASTRODUODENOSCOPY  09/08/2012   +HH.  [name].  Symptoms: anemia, hemoccult +.  [name] LAPAROSCOPIC GASTRIC SLEEVE RESECTION N/A 03/01/2016   Procedure: LAPAROSCOPIC GASTRIC SLEEVE RESECTION, UPPER ENDO;  Surgeon: [name], MD;  Location: [facility];  Service: General;  Laterality: N/A;  . LAPAROSCOPIC LYSIS OF ADHESIONS N/A 02/10/2016   Procedure: LAPAROSCOPIC LYSIS OF ADHESIONS;  Surgeon: [name], MD;  Location: [facility];  Service: General;  Laterality: N/A;  . LAPAROSCOPY N/A 02/10/2016   Procedure: LAPAROSCOPY DIAGNOSTIC;  Surgeon: [name], MD;  Location: [facility];  Service: General;  Laterality: N/A;  . NASAL SEPTUM SURGERY    . NECK SURGERY    . Rheumatology  Consult  11/26/2011   multiple arthralgias. Autoimmune labs negative.  Rx for Plaquenil for non-specific autoimmune process.  [name].  [name] SIGMOID RESECTION / RECTOPEXY    . SPINE SURGERY    . TONSILLECTOMY AND ADENOIDECTOMY      Current Outpatient Medications  Medication Sig Dispense Refill  . aspirin-acetaminophen-caffeine (EXCEDRIN MIGRAINE) 250-250-65 MG tablet Take 2 tablets by mouth daily as needed for headache.    . beclomethasone (QVAR) 80 MCG/ACT inhaler Inhale 2 puffs into the lungs daily.     . cetirizine (ZYRTEC) 10 MG tablet Take 10 mg by mouth 2 (two) times daily.     [name] conjugated estrogens (PREMARIN) vaginal cream Place 1 Applicatorful vaginally at bedtime. (Patient taking differently: Place 1 Applicatorful vaginally every Monday. ) 42.5 g 12  . diclofenac (FLECTOR) 1.3 % PTCH Apply one patch or a portion of patch to painful area of skin twice per day if tolerated (Patient taking differently: Place 1 patch onto the skin 2 (two) times daily as needed (pain). Apply one patch or a portion of patch to painful area of skin twice per day if tolerated) 60 patch 0  . diclofenac sodium (VOLTAREN) 1 % GEL Apply 1 application topically daily as needed for pain.  0  . doxycycline (VIBRA-TABS) 100 MG tablet Take 1 tablet (100 mg total) by mouth 2 (two) times daily. 20 tablet 0  . DULoxetine (CYMBALTA) 60 MG capsule  Take 1 capsule (60 mg total) by mouth 2 (two) times daily. 180 capsule 1  . IRON PO Take 1 tablet by mouth 2 (two) times daily.     . mometasone (NASONEX) 50 MCG/ACT nasal spray Place 2 sprays into the nose daily. (Patient taking differently: Place 1 spray into the nose 2 (two) times daily. ) 17 g 12  . montelukast (SINGULAIR) 10 MG tablet Take 1 tablet (10 mg total) by mouth daily. 90 tablet 3  . Olopatadine HCl 0.2 % SOLN Apply 1 drop to eye daily. 2.5 mL 11  . pantoprazole (PROTONIX) 40 MG tablet TAKE 1 TABLET BY MOUTH TWO TIMES DAILY 180 tablet 3  . phenazopyridine  (PYRIDIUM) 97 MG tablet Take 97-194 mg by mouth daily as needed for pain.    [name] PREVIDENT 5000 DRY MOUTH 1.1 % GEL dental gel Place 1 application onto teeth 2 (two) times daily.  5  . promethazine (PHENERGAN) 25 MG tablet Take 1 tablet (25 mg total) by mouth every 8 (eight) hours as needed for nausea or vomiting. 20 tablet 0  . azithromycin (ZITHROMAX Z-PAK) 250 MG tablet Take 1 tablet (250 mg total) by mouth daily. 2 tabs today, then 1 a day x 4 more days (Patient not taking: Reported on 09/13/2017) 6 tablet 0  . hydrocortisone (ANUSOL-HC) 25 MG suppository Place 1 suppository (25 mg total) rectally 2 (two) times daily as needed for hemorrhoids. (Patient not taking: Reported on 09/13/2017) 12 suppository 2  . levofloxacin (LEVAQUIN) 750 MG tablet Take 1 tablet (750 mg total) by mouth daily. (Patient not taking: Reported on 09/13/2017) 10 tablet 0  . nitrofurantoin, macrocrystal-monohydrate, (MACROBID) 100 MG capsule Take 1 capsule (100 mg total) by mouth 2 (two) times daily. (Patient not taking: Reported on 09/13/2017) 10 capsule 0   No current facility-administered medications for this visit.     Allergies  Allergen Reactions  . Cephalexin Anaphylaxis  . Cephalosporins Anaphylaxis  . Penicillins Anaphylaxis    Has patient had a PCN reaction causing immediate rash, facial/tongue/throat swelling, SOB or lightheadedness with hypotension: Yes Has patient had a PCN reaction causing severe rash involving mucus membranes or skin necrosis: No Has patient had a PCN reaction that required hospitalization Yes Has patient had a PCN reaction occurring within the last 10 years: No If all of the above answers are "NO", then may proceed with Cephalosporin use.     Indication: Patient presents with symptomatic varicose veins of the right lower extremity.  Procedure: Foam sclerotherapy was performed on the right lower extremity. Using ultrasound guidance, 5 mL of foam Sotradecol was used to inject the  varicosities of the right lower extremity. Compression wraps were placed. The patient tolerated the procedure well.

## 2017-09-20 ENCOUNTER — Ambulatory Visit (INDEPENDENT_AMBULATORY_CARE_PROVIDER_SITE_OTHER): Payer: 59 | Admitting: Vascular Surgery

## 2017-09-26 MED ORDER — CLINDAMYCIN PHOSPHATE 900 MG/50ML IV SOLN
900.0000 mg | Freq: Once | INTRAVENOUS | Status: AC
  Administered 2017-09-27: 900 mg via INTRAVENOUS

## 2017-09-26 MED ORDER — TRANEXAMIC ACID 1000 MG/10ML IV SOLN
1000.0000 mg | INTRAVENOUS | Status: DC
  Filled 2017-09-26: qty 10

## 2017-09-27 ENCOUNTER — Inpatient Hospital Stay: Payer: 59 | Admitting: Anesthesiology

## 2017-09-27 ENCOUNTER — Encounter: Payer: Self-pay | Admitting: *Deleted

## 2017-09-27 ENCOUNTER — Encounter
Admission: RE | Disposition: A | Discharge status: Home Health | Payer: Self-pay | Source: Home / Self Care | Attending: Orthopedic Surgery

## 2017-09-27 ENCOUNTER — Inpatient Hospital Stay: Payer: 59

## 2017-09-27 ENCOUNTER — Inpatient Hospital Stay
Admission: RE | Admit: 2017-09-27 | Discharge: 2017-09-30 | DRG: 469 | Disposition: A | Discharge status: Home Health | Payer: 59 | Attending: Orthopedic Surgery | Admitting: Orthopedic Surgery

## 2017-09-27 ENCOUNTER — Other Ambulatory Visit: Payer: Self-pay

## 2017-09-27 DIAGNOSIS — Z79899 Other long term (current) drug therapy: Secondary | ICD-10-CM | POA: Diagnosis not present

## 2017-09-27 DIAGNOSIS — E669 Obesity, unspecified: Secondary | ICD-10-CM | POA: Diagnosis present

## 2017-09-27 DIAGNOSIS — M1611 Unilateral primary osteoarthritis, right hip: Secondary | ICD-10-CM | POA: Diagnosis not present

## 2017-09-27 DIAGNOSIS — Z880 Allergy status to penicillin: Secondary | ICD-10-CM | POA: Diagnosis not present

## 2017-09-27 DIAGNOSIS — E785 Hyperlipidemia, unspecified: Secondary | ICD-10-CM | POA: Diagnosis present

## 2017-09-27 DIAGNOSIS — Z8249 Family history of ischemic heart disease and other diseases of the circulatory system: Secondary | ICD-10-CM

## 2017-09-27 DIAGNOSIS — Z471 Aftercare following joint replacement surgery: Secondary | ICD-10-CM | POA: Diagnosis not present

## 2017-09-27 DIAGNOSIS — S72114A Nondisplaced fracture of greater trochanter of right femur, initial encounter for closed fracture: Secondary | ICD-10-CM | POA: Diagnosis present

## 2017-09-27 DIAGNOSIS — F418 Other specified anxiety disorders: Secondary | ICD-10-CM | POA: Diagnosis not present

## 2017-09-27 DIAGNOSIS — E7800 Pure hypercholesterolemia, unspecified: Secondary | ICD-10-CM | POA: Diagnosis not present

## 2017-09-27 DIAGNOSIS — Z881 Allergy status to other antibiotic agents status: Secondary | ICD-10-CM | POA: Diagnosis not present

## 2017-09-27 DIAGNOSIS — Z09 Encounter for follow-up examination after completed treatment for conditions other than malignant neoplasm: Secondary | ICD-10-CM

## 2017-09-27 DIAGNOSIS — Z6830 Body mass index (BMI) 30.0-30.9, adult: Secondary | ICD-10-CM | POA: Diagnosis not present

## 2017-09-27 DIAGNOSIS — Z87891 Personal history of nicotine dependence: Secondary | ICD-10-CM

## 2017-09-27 DIAGNOSIS — Z96641 Presence of right artificial hip joint: Secondary | ICD-10-CM | POA: Diagnosis not present

## 2017-09-27 DIAGNOSIS — Z9884 Bariatric surgery status: Secondary | ICD-10-CM

## 2017-09-27 DIAGNOSIS — I10 Essential (primary) hypertension: Secondary | ICD-10-CM | POA: Diagnosis not present

## 2017-09-27 DIAGNOSIS — E119 Type 2 diabetes mellitus without complications: Secondary | ICD-10-CM | POA: Diagnosis not present

## 2017-09-27 DIAGNOSIS — J45909 Unspecified asthma, uncomplicated: Secondary | ICD-10-CM | POA: Diagnosis not present

## 2017-09-27 DIAGNOSIS — M25551 Pain in right hip: Secondary | ICD-10-CM | POA: Diagnosis present

## 2017-09-27 DIAGNOSIS — G8918 Other acute postprocedural pain: Secondary | ICD-10-CM

## 2017-09-27 DIAGNOSIS — X58XXXA Exposure to other specified factors, initial encounter: Secondary | ICD-10-CM | POA: Diagnosis present

## 2017-09-27 DIAGNOSIS — K219 Gastro-esophageal reflux disease without esophagitis: Secondary | ICD-10-CM | POA: Diagnosis not present

## 2017-09-27 HISTORY — PX: TOTAL HIP ARTHROPLASTY: SHX124

## 2017-09-27 LAB — ABO/RH: ABO/RH(D): O NEG

## 2017-09-27 SURGERY — ARTHROPLASTY, HIP, TOTAL, ANTERIOR APPROACH
Anesthesia: General | Laterality: Right

## 2017-09-27 MED ORDER — NEOMYCIN-POLYMYXIN B GU 40-200000 IR SOLN
Status: AC
  Filled 2017-09-27: qty 4

## 2017-09-27 MED ORDER — SODIUM CHLORIDE 0.9 % IV SOLN
INTRAVENOUS | Status: DC | PRN
  Administered 2017-09-27: 25 ug/min via INTRAVENOUS

## 2017-09-27 MED ORDER — ZOLPIDEM TARTRATE 5 MG PO TABS: 5.0000 mg | ORAL_TABLET | Freq: Every evening | ORAL | Status: DC | PRN

## 2017-09-27 MED ORDER — FENTANYL CITRATE (PF) 100 MCG/2ML IJ SOLN
INTRAMUSCULAR | Status: AC
  Administered 2017-09-27: 25 ug via INTRAVENOUS
  Filled 2017-09-27: qty 2

## 2017-09-27 MED ORDER — PROPOFOL 500 MG/50ML IV EMUL
INTRAVENOUS | Status: DC | PRN
  Administered 2017-09-27: 125 ug/kg/min via INTRAVENOUS

## 2017-09-27 MED ORDER — METHOCARBAMOL 500 MG PO TABS
500.0000 mg | ORAL_TABLET | Freq: Four times a day (QID) | ORAL | Status: DC | PRN
  Administered 2017-09-27 – 2017-09-30 (×7): 500 mg via ORAL
  Filled 2017-09-27 (×7): qty 1

## 2017-09-27 MED ORDER — NEOMYCIN-POLYMYXIN B GU 40-200000 IR SOLN
Status: DC | PRN
  Administered 2017-09-27: 4 mL

## 2017-09-27 MED ORDER — BUDESONIDE 0.5 MG/2ML IN SUSP
0.5000 mg | Freq: Two times a day (BID) | RESPIRATORY_TRACT | Status: DC
  Administered 2017-09-27 – 2017-09-30 (×6): 0.5 mg via RESPIRATORY_TRACT
  Filled 2017-09-27 (×6): qty 2

## 2017-09-27 MED ORDER — HYDROCODONE-ACETAMINOPHEN 7.5-325 MG PO TABS
1.0000 | ORAL_TABLET | ORAL | Status: DC | PRN
  Administered 2017-09-27 – 2017-09-29 (×7): 2 via ORAL
  Filled 2017-09-27 (×7): qty 2

## 2017-09-27 MED ORDER — SODIUM FLUORIDE 1.1 % DT GEL: 1.0000 "application " | Freq: Two times a day (BID) | DENTAL | Status: DC

## 2017-09-27 MED ORDER — MONTELUKAST SODIUM 10 MG PO TABS
10.0000 mg | ORAL_TABLET | Freq: Every day | ORAL | Status: DC
  Administered 2017-09-28 – 2017-09-30 (×3): 10 mg via ORAL
  Filled 2017-09-27 (×3): qty 1

## 2017-09-27 MED ORDER — LORATADINE 10 MG PO TABS
10.0000 mg | ORAL_TABLET | Freq: Every day | ORAL | Status: DC
  Administered 2017-09-28 – 2017-09-30 (×3): 10 mg via ORAL
  Filled 2017-09-27 (×3): qty 1

## 2017-09-27 MED ORDER — ONDANSETRON HCL 4 MG PO TABS: 4.0000 mg | ORAL_TABLET | Freq: Four times a day (QID) | ORAL | Status: DC | PRN

## 2017-09-27 MED ORDER — ACETAMINOPHEN 325 MG PO TABS: 325.0000 mg | ORAL_TABLET | Freq: Four times a day (QID) | ORAL | Status: DC | PRN

## 2017-09-27 MED ORDER — SCOPOLAMINE 1 MG/3DAYS TD PT72
1.0000 | MEDICATED_PATCH | TRANSDERMAL | Status: DC
  Administered 2017-09-27 – 2017-09-30 (×2): 1.5 mg via TRANSDERMAL
  Filled 2017-09-27: qty 1

## 2017-09-27 MED ORDER — OLOPATADINE HCL 0.1 % OP SOLN
1.0000 [drp] | Freq: Two times a day (BID) | OPHTHALMIC | Status: DC
  Administered 2017-09-27 – 2017-09-30 (×6): 1 [drp] via OPHTHALMIC
  Filled 2017-09-27: qty 5

## 2017-09-27 MED ORDER — METOCLOPRAMIDE HCL 5 MG/ML IJ SOLN: 5.0000 mg | Freq: Three times a day (TID) | INTRAMUSCULAR | Status: DC | PRN

## 2017-09-27 MED ORDER — GABAPENTIN 300 MG PO CAPS
300.0000 mg | ORAL_CAPSULE | Freq: Three times a day (TID) | ORAL | Status: DC
  Administered 2017-09-27 – 2017-09-30 (×10): 300 mg via ORAL
  Filled 2017-09-27 (×10): qty 1

## 2017-09-27 MED ORDER — OXYCODONE HCL 5 MG/5ML PO SOLN: 5.0000 mg | Freq: Once | ORAL | Status: DC | PRN

## 2017-09-27 MED ORDER — DIPHENHYDRAMINE HCL 12.5 MG/5ML PO ELIX: 12.5000 mg | ORAL_SOLUTION | ORAL | Status: DC | PRN

## 2017-09-27 MED ORDER — PHENOL 1.4 % MT LIQD
1.0000 | OROMUCOSAL | Status: DC | PRN
  Filled 2017-09-27: qty 177

## 2017-09-27 MED ORDER — CLINDAMYCIN PHOSPHATE 900 MG/50ML IV SOLN
INTRAVENOUS | Status: AC
  Filled 2017-09-27: qty 50

## 2017-09-27 MED ORDER — BUPIVACAINE-EPINEPHRINE 0.25% -1:200000 IJ SOLN
INTRAMUSCULAR | Status: DC | PRN
  Administered 2017-09-27: 30 mL

## 2017-09-27 MED ORDER — LACTATED RINGERS IV SOLN
INTRAVENOUS | Status: DC
  Administered 2017-09-27: 12:00:00 via INTRAVENOUS

## 2017-09-27 MED ORDER — PROPOFOL 500 MG/50ML IV EMUL
INTRAVENOUS | Status: AC
  Filled 2017-09-27: qty 50

## 2017-09-27 MED ORDER — DULOXETINE HCL 60 MG PO CPEP
60.0000 mg | ORAL_CAPSULE | Freq: Two times a day (BID) | ORAL | Status: DC
  Administered 2017-09-27 – 2017-09-30 (×6): 60 mg via ORAL
  Filled 2017-09-27 (×7): qty 1

## 2017-09-27 MED ORDER — OXYCODONE HCL 5 MG PO TABS: 5.0000 mg | ORAL_TABLET | Freq: Once | ORAL | Status: DC | PRN

## 2017-09-27 MED ORDER — FLUTICASONE PROPIONATE 50 MCG/ACT NA SUSP
1.0000 | Freq: Every day | NASAL | Status: DC
  Administered 2017-09-28 – 2017-09-30 (×3): 1 via NASAL
  Filled 2017-09-27: qty 16

## 2017-09-27 MED ORDER — FENTANYL CITRATE (PF) 100 MCG/2ML IJ SOLN: 25.0000 ug | INTRAMUSCULAR | Status: DC | PRN

## 2017-09-27 MED ORDER — METOCLOPRAMIDE HCL 10 MG PO TABS: 5.0000 mg | ORAL_TABLET | Freq: Three times a day (TID) | ORAL | Status: DC | PRN

## 2017-09-27 MED ORDER — MAGNESIUM CITRATE PO SOLN
1.0000 | Freq: Once | ORAL | Status: DC | PRN
  Filled 2017-09-27: qty 296

## 2017-09-27 MED ORDER — BUPIVACAINE-EPINEPHRINE (PF) 0.25% -1:200000 IJ SOLN
INTRAMUSCULAR | Status: AC
  Filled 2017-09-27: qty 30

## 2017-09-27 MED ORDER — ASPIRIN EC 325 MG PO TBEC
325.0000 mg | DELAYED_RELEASE_TABLET | Freq: Every day | ORAL | Status: DC
  Administered 2017-09-28 – 2017-09-30 (×3): 325 mg via ORAL
  Filled 2017-09-27 (×3): qty 1

## 2017-09-27 MED ORDER — SODIUM CHLORIDE 0.9 % IV SOLN
INTRAVENOUS | Status: DC
  Administered 2017-09-27 – 2017-09-29 (×7): via INTRAVENOUS

## 2017-09-27 MED ORDER — MENTHOL 3 MG MT LOZG
1.0000 | LOZENGE | OROMUCOSAL | Status: DC | PRN
  Filled 2017-09-27: qty 9

## 2017-09-27 MED ORDER — METHOCARBAMOL 1000 MG/10ML IJ SOLN
500.0000 mg | Freq: Four times a day (QID) | INTRAVENOUS | Status: DC | PRN
  Filled 2017-09-27: qty 5

## 2017-09-27 MED ORDER — DOCUSATE SODIUM 100 MG PO CAPS
100.0000 mg | ORAL_CAPSULE | Freq: Two times a day (BID) | ORAL | Status: DC
  Administered 2017-09-27 – 2017-09-30 (×7): 100 mg via ORAL
  Filled 2017-09-27 (×7): qty 1

## 2017-09-27 MED ORDER — PROPOFOL 10 MG/ML IV BOLUS
INTRAVENOUS | Status: DC | PRN
  Administered 2017-09-27: 50 mg via INTRAVENOUS

## 2017-09-27 MED ORDER — ALUM & MAG HYDROXIDE-SIMETH 200-200-20 MG/5ML PO SUSP: 30.0000 mL | ORAL | Status: DC | PRN

## 2017-09-27 MED ORDER — ONDANSETRON HCL 4 MG/2ML IJ SOLN: 4.0000 mg | Freq: Four times a day (QID) | INTRAMUSCULAR | Status: DC | PRN

## 2017-09-27 MED ORDER — CLINDAMYCIN PHOSPHATE 600 MG/50ML IV SOLN
600.0000 mg | Freq: Four times a day (QID) | INTRAVENOUS | Status: AC
  Administered 2017-09-27 – 2017-09-28 (×3): 600 mg via INTRAVENOUS
  Filled 2017-09-27 (×3): qty 50

## 2017-09-27 MED ORDER — SENNOSIDES-DOCUSATE SODIUM 8.6-50 MG PO TABS: 1.0000 | ORAL_TABLET | Freq: Every evening | ORAL | Status: DC | PRN

## 2017-09-27 MED ORDER — HYDROCODONE-ACETAMINOPHEN 5-325 MG PO TABS
1.0000 | ORAL_TABLET | ORAL | Status: DC | PRN
  Administered 2017-09-27 – 2017-09-28 (×2): 2 via ORAL
  Filled 2017-09-27 (×2): qty 2

## 2017-09-27 MED ORDER — MIDAZOLAM HCL 5 MG/5ML IJ SOLN
INTRAMUSCULAR | Status: DC | PRN
  Administered 2017-09-27: 2 mg via INTRAVENOUS

## 2017-09-27 MED ORDER — PANTOPRAZOLE SODIUM 40 MG PO TBEC
40.0000 mg | DELAYED_RELEASE_TABLET | Freq: Two times a day (BID) | ORAL | Status: DC
  Administered 2017-09-27 – 2017-09-30 (×6): 40 mg via ORAL
  Filled 2017-09-27 (×6): qty 1

## 2017-09-27 MED ORDER — MORPHINE SULFATE (PF) 2 MG/ML IV SOLN
0.5000 mg | INTRAVENOUS | Status: DC | PRN
  Administered 2017-09-27: 1 mg via INTRAVENOUS
  Filled 2017-09-27: qty 1

## 2017-09-27 MED ORDER — ONDANSETRON HCL 4 MG/2ML IJ SOLN
INTRAMUSCULAR | Status: DC | PRN
  Administered 2017-09-27: 4 mg via INTRAVENOUS

## 2017-09-27 MED ORDER — FENTANYL CITRATE (PF) 100 MCG/2ML IJ SOLN
25.0000 ug | INTRAMUSCULAR | Status: DC | PRN
  Administered 2017-09-27: 25 ug via INTRAVENOUS

## 2017-09-27 MED ORDER — TRAMADOL HCL 50 MG PO TABS
50.0000 mg | ORAL_TABLET | Freq: Four times a day (QID) | ORAL | Status: DC
  Administered 2017-09-27 – 2017-09-30 (×9): 50 mg via ORAL
  Filled 2017-09-27 (×10): qty 1

## 2017-09-27 MED ORDER — MIDAZOLAM HCL 2 MG/2ML IJ SOLN
INTRAMUSCULAR | Status: AC
  Filled 2017-09-27: qty 2

## 2017-09-27 MED ORDER — SCOPOLAMINE 1 MG/3DAYS TD PT72
MEDICATED_PATCH | TRANSDERMAL | Status: AC
  Administered 2017-09-27: 1.5 mg via TRANSDERMAL
  Filled 2017-09-27: qty 1

## 2017-09-27 MED ORDER — BUPIVACAINE HCL (PF) 0.5 % IJ SOLN
INTRAMUSCULAR | Status: DC | PRN
  Administered 2017-09-27: 3 mL

## 2017-09-27 MED ORDER — BISACODYL 5 MG PO TBEC
5.0000 mg | DELAYED_RELEASE_TABLET | Freq: Every day | ORAL | Status: DC | PRN
  Administered 2017-09-28: 5 mg via ORAL
  Filled 2017-09-27: qty 1

## 2017-09-27 SURGICAL SUPPLY — 57 items
BLADE SAGITTAL AGGR TOOTH XLG (BLADE) ×2 IMPLANT
BNDG COHESIVE 6X5 TAN STRL LF (GAUZE/BANDAGES/DRESSINGS) ×6 IMPLANT
CANISTER SUCT 1200ML W/VALVE (MISCELLANEOUS) ×2 IMPLANT
CHLORAPREP W/TINT 26ML (MISCELLANEOUS) ×2 IMPLANT
DRAPE C-ARM XRAY 36X54 (DRAPES) ×2 IMPLANT
DRAPE INCISE IOBAN 66X60 STRL (DRAPES) IMPLANT
DRAPE POUCH INSTRU U-SHP 10X18 (DRAPES) ×2 IMPLANT
DRAPE SHEET LG 3/4 BI-LAMINATE (DRAPES) ×6 IMPLANT
DRAPE TABLE BACK 80X90 (DRAPES) ×2 IMPLANT
DRESSING SURGICEL FIBRLLR 1X2 (HEMOSTASIS) ×2 IMPLANT
DRSG OPSITE POSTOP 4X8 (GAUZE/BANDAGES/DRESSINGS) ×4 IMPLANT
DRSG SURGICEL FIBRILLAR 1X2 (HEMOSTASIS) ×4
ELECT BLADE 6.5 EXT (BLADE) ×2 IMPLANT
ELECT REM PT RETURN 9FT ADLT (ELECTROSURGICAL) ×2
ELECTRODE REM PT RTRN 9FT ADLT (ELECTROSURGICAL) ×1 IMPLANT
GLOVE BIOGEL PI IND STRL 9 (GLOVE) ×1 IMPLANT
GLOVE BIOGEL PI INDICATOR 9 (GLOVE) ×1
GLOVE SURG SYN 9.0  PF PI (GLOVE) ×2
GLOVE SURG SYN 9.0 PF PI (GLOVE) ×2 IMPLANT
GOWN SRG 2XL LVL 4 RGLN SLV (GOWNS) ×1 IMPLANT
GOWN STRL NON-REIN 2XL LVL4 (GOWNS) ×2
GOWN STRL REUS W/ TWL LRG LVL3 (GOWN DISPOSABLE) ×1 IMPLANT
GOWN STRL REUS W/TWL LRG LVL3 (GOWN DISPOSABLE) ×2
HEAD FEMORAL 28MM SZ S (Head) ×2 IMPLANT
HEMOVAC 400CC 10FR (MISCELLANEOUS) IMPLANT
HIP DBL LINER 54X28 (Liner) ×2 IMPLANT
HOLDER FOLEY CATH W/STRAP (MISCELLANEOUS) ×2 IMPLANT
HOOD PEEL AWAY FLYTE STAYCOOL (MISCELLANEOUS) ×2 IMPLANT
KIT PREVENA INCISION MGT 13 (CANNISTER) ×2 IMPLANT
MAT ABSORB  FLUID 56X50 GRAY (MISCELLANEOUS) ×1
MAT ABSORB FLUID 56X50 GRAY (MISCELLANEOUS) ×1 IMPLANT
NDL SAFETY ECLIPSE 18X1.5 (NEEDLE) ×1 IMPLANT
NEEDLE HYPO 18GX1.5 SHARP (NEEDLE) ×2
NEEDLE SPNL 18GX3.5 QUINCKE PK (NEEDLE) ×2 IMPLANT
NS IRRIG 1000ML POUR BTL (IV SOLUTION) ×2 IMPLANT
PACK HIP COMPR (MISCELLANEOUS) ×2 IMPLANT
SCALPEL PROTECTED #10 DISP (BLADE) ×4 IMPLANT
SHELL ACETABULAR SZ 54 DM (Shell) ×2 IMPLANT
SOL PREP PVP 2OZ (MISCELLANEOUS) ×2
SOLUTION PREP PVP 2OZ (MISCELLANEOUS) ×1 IMPLANT
SPONGE DRAIN TRACH 4X4 STRL 2S (GAUZE/BANDAGES/DRESSINGS) ×2 IMPLANT
STAPLER SKIN PROX 35W (STAPLE) ×2 IMPLANT
STEM FEMORAL SZ2 STD COLLARED (Stem) ×2 IMPLANT
STRAP SAFETY 5IN WIDE (MISCELLANEOUS) ×2 IMPLANT
SUT DVC 2 QUILL PDO  T11 36X36 (SUTURE) ×1
SUT DVC 2 QUILL PDO T11 36X36 (SUTURE) ×1 IMPLANT
SUT SILK 0 (SUTURE) ×1
SUT SILK 0 30XBRD TIE 6 (SUTURE) ×1 IMPLANT
SUT V-LOC 90 ABS DVC 3-0 CL (SUTURE) ×2 IMPLANT
SUT VIC AB 1 CT1 36 (SUTURE) ×2 IMPLANT
SYR 20CC LL (SYRINGE) ×2 IMPLANT
SYR 30ML LL (SYRINGE) ×2 IMPLANT
SYR BULB IRRIG 60ML STRL (SYRINGE) ×2 IMPLANT
TAPE MICROFOAM 4IN (TAPE) ×2 IMPLANT
TOWEL OR 17X26 4PK STRL BLUE (TOWEL DISPOSABLE) ×2 IMPLANT
TRAY FOLEY MTR SLVR 16FR STAT (SET/KITS/TRAYS/PACK) ×2 IMPLANT
WND VAC CANISTER 500ML (MISCELLANEOUS) ×2 IMPLANT

## 2017-09-27 NOTE — Addendum Note (Signed)
Addendum  created 09/27/17 1508 by Durenda Hurt, MD   Order list changed, Order sets accessed

## 2017-09-27 NOTE — Anesthesia Procedure Notes (Signed)
Spinal  Patient location during procedure: OR Start time: 09/27/2017 12:20 PM End time: 09/27/2017 12:24 PM Staffing Resident/CRNA: Nelda Marseille, CRNA Performed: resident/CRNA  Preanesthetic Checklist Completed: patient identified, site marked, surgical consent, pre-op evaluation, timeout performed, IV checked, risks and benefits discussed and monitors and equipment checked Spinal Block Patient position: sitting Prep: Betadine Patient monitoring: heart rate, continuous pulse ox, blood pressure and cardiac monitor Approach: midline Location: L4-5 Injection technique: single-shot Needle Needle type: Whitacre and Introducer  Needle gauge: 25 G Needle length: 9 cm Assessment Sensory level: T10 Additional Notes Negative paresthesia. Negative blood return. Positive free-flowing CSF. Expiration date of kit checked and confirmed. Patient tolerated procedure well, without complications.

## 2017-09-27 NOTE — Op Note (Signed)
09/27/2017  2:26 PM  PATIENT:  Bethany Scott  62 y.o. female  PRE-OPERATIVE DIAGNOSIS:  PRIMARY LOCALIZED OSTEOARTHRITIS OF RIGHT HIP  POST-OPERATIVE DIAGNOSIS:  PRIMARY LOCALIZED OSTEOARTHRITIS OF RIGHT HIP  PROCEDURE:  Procedure(s): TOTAL HIP ARTHROPLASTY ANTERIOR APPROACH (Right)  SURGEON: Laurene Footman, MD  ASSISTANTS: none  ANESTHESIA:   spinal  EBL:  Total I/O In: 900 [I.V.:900] Out: 700 [Urine:200; Blood:500]  BLOOD ADMINISTERED:none  DRAINS: none   LOCAL MEDICATIONS USED:  MARCAINE     SPECIMEN:  Source of Specimen:  right femoral head  DISPOSITION OF SPECIMEN:  PATHOLOGY  COUNTS:  YES  TOURNIQUET:  * No tourniquets in log *  IMPLANTS: Medacta AMIS 2 standard stem, 54 mm Mpact cup DM liner and S 28 mm ceramic head  DICTATION: .Dragon Dictation   The patient was brought to the operating room and after spinal anesthesia was obtained patient was placed on the operative table with the ipsilateral foot into the Medacta attachment, contralateral leg on a well-padded table. C-arm was brought in and preop template x-ray taken. After prepping and draping in usual sterile fashion appropriate patient identification and timeout procedures were completed. Anterior approach to the hip was obtained and centered over the greater trochanter and TFL muscle. The subcutaneous tissue was incised hemostasis being achieved by electrocautery. TFL fascia was incised and the muscle retracted laterally deep retractor placed. The lateral femoral circumflex vessels were identified and ligated. The anterior capsule was exposed and a capsulotomy performed. The neck was identified and a femoral neck cut carried out with a saw. The head was removed without difficulty and showed sclerotic femoral head and acetabulum. Reaming was carried out to 52 mm and a 54 mm cup trial gave appropriate tightness to the acetabular component a 54 DM cup was impacted into position. The leg was then externally rotated  and ischiofemoral and pubofemoral releases carried out. The femur was sequentially broached to a size 2, size 2 standard with S head trials were placed and the final components chosen. The 2 standard stem was inserted along with a ceramic S 28 mm head and 54 mm liner. The hip was reduced and was stable the wound was thoroughly irrigated with fibrillar placed on the posterior joint capsular release area in the medial neck cut made at this time on fluoroscopic view there is a foreign body present and on inspection it was determined that a spring from the rasped holding equipment had broken off and fallen into the canal so the implant was removed the spring removed without any difficulty and the stem reinserted.  The anterior portion of the greater trochanter had slight nondisplaced fracture but was stable and could not be moved Intra-Op and so was left alone, the repeat x-ray showed appropriate leg length. The deep fascia closed using a heavy Quill after infiltration of 30 cc of quarter percent Sensorcaine with epinephrine. 3-0 v-locl to close the skin with skin staples incisional wound VAC applied  PLAN OF CARE: Admit to inpatient

## 2017-09-27 NOTE — Anesthesia Postprocedure Evaluation (Signed)
Anesthesia Post Note  Patient: Bethany Scott  Procedure(s) Performed: TOTAL HIP ARTHROPLASTY ANTERIOR APPROACH (Right )  Patient location during evaluation: PACU Anesthesia Type: General and Spinal Level of consciousness: awake and alert Pain management: pain level controlled Vital Signs Assessment: post-procedure vital signs reviewed and stable Respiratory status: spontaneous breathing, nonlabored ventilation, respiratory function stable and patient connected to nasal cannula oxygen Cardiovascular status: blood pressure returned to baseline and stable Postop Assessment: no apparent nausea or vomiting Anesthetic complications: no     Last Vitals:  Vitals:   09/27/17 1430 09/27/17 1440  BP: 119/62 (!) 115/57  Pulse: 81 68  Resp: 14 16  Temp:    SpO2: 100% 100%    Last Pain:  Vitals:   09/27/17 1430  TempSrc:   PainSc: 0-No pain                 Durenda Hurt

## 2017-09-27 NOTE — H&P (Signed)
Reviewed paper H+P, will be scanned into chart. No changes noted.  

## 2017-09-27 NOTE — Anesthesia Procedure Notes (Signed)
Date/Time: 09/27/2017 12:45 PM Performed by: Nelda Marseille, CRNA Pre-anesthesia Checklist: Patient identified, Emergency Drugs available, Suction available, Patient being monitored and Timeout performed Oxygen Delivery Method: Simple face mask

## 2017-09-27 NOTE — Anesthesia Post-op Follow-up Note (Signed)
Anesthesia QCDR form completed.        

## 2017-09-27 NOTE — Anesthesia Preprocedure Evaluation (Addendum)
Anesthesia Evaluation  Patient identified by MRN, date of birth, ID band Patient awake    Reviewed: Allergy & Precautions, H&P , NPO status , Patient's Chart, lab work & pertinent test results  History of Anesthesia Complications (+) PONV and history of anesthetic complications  Airway Mallampati: I  TM Distance: >3 FB Neck ROM: full    Dental no notable dental hx. (+) Teeth Intact   Pulmonary neg pulmonary ROS, asthma , former smoker,    breath sounds clear to auscultation       Cardiovascular hypertension, + Peripheral Vascular Disease  negative cardio ROS   Rhythm:regular Rate:Normal     Neuro/Psych  Headaches, PSYCHIATRIC DISORDERS Anxiety Depression negative neurological ROS  negative psych ROS   GI/Hepatic negative GI ROS, Neg liver ROS, PUD, GERD  ,  Endo/Other  negative endocrine ROSdiabetes  Renal/GU negative Renal ROS  negative genitourinary   Musculoskeletal  (+) Arthritis ,   Abdominal   Peds  Hematology negative hematology ROS (+) anemia ,   Anesthesia Other Findings Past Medical History: No date: Allergy No date: Anemia No date: Anxiety No date: Arthritis     Comment:  OA Knee; non-specific autoimmune process followed by               [facility]/Rheumatology. No date: Asthma YRS AGO: Blood transfusion without reported diagnosis No date: Depression No date: Diabetes mellitus without complication ([place]) YRS AGO: Gastric ulcer, unspecified as acute or chronic, without  mention of hemorrhage, perforation, or obstruction No date: GERD (gastroesophageal reflux disease) No date: Hyperlipidemia No date: Hypertension No date: Migraine     Comment:  hx of migraines No date: Obesity, unspecified No date: Other chronic cystitis No date: Personal history of colonic polyps No date: PONV (postoperative nausea and vomiting)     Comment:  NAUSEA, SCOPOLAMINE PATCH HELPED WITH LAST SURGERIES No date:  Pre-diabetes  Past Surgical History: 01/25/1994: ABDOMINAL HYSTERECTOMY     Comment:  uterine fibroids; DUB; cervical dysplasia; ovaries               resected two years later.   No date: APPENDECTOMY 2001: BILATERAL OOPHORECTOMY No date: CESAREAN SECTION     Comment:  x 2 YRS AGO: COLON SURGERY     Comment:  SIGMOIDECTOMY 09/08/2012: Colonoscopy     Comment:  diverticulosis, hemorrhoids.  [name].  Symptoms:                anemia, hemoccult +. 09/08/2012: ESOPHAGOGASTRODUODENOSCOPY     Comment:  +HH.  [name].  Symptoms: anemia, hemoccult +. 03/01/2016: LAPAROSCOPIC GASTRIC SLEEVE RESECTION; N/A     Comment:  Procedure: LAPAROSCOPIC GASTRIC SLEEVE RESECTION, UPPER               ENDO;  Surgeon: [name], MD;  Location: [facility];                Service: General;  Laterality: N/A; 02/10/2016: LAPAROSCOPIC LYSIS OF ADHESIONS; N/A     Comment:  Procedure: LAPAROSCOPIC LYSIS OF ADHESIONS;  Surgeon:               [name], MD;  Location: [facility];  Service: General;                Laterality: N/A; 02/10/2016: LAPAROSCOPY; N/A     Comment:  Procedure: LAPAROSCOPY DIAGNOSTIC;  Surgeon: [name], MD;  Location: [facility];  Service: General;  Laterality: N/A; No date: NASAL SEPTUM SURGERY No date: NECK SURGERY 11/26/2011: Rheumatology Consult     Comment:  multiple arthralgias. Autoimmune labs negative.  Rx for               Plaquenil for non-specific autoimmune process.  [name]. No date: SIGMOID RESECTION / RECTOPEXY No date: SPINE SURGERY No date: TONSILLECTOMY AND ADENOIDECTOMY  BMI    Body Mass Index:  30.01 kg/m      Reproductive/Obstetrics negative OB ROS                            Anesthesia Physical Anesthesia Plan  ASA: III  Anesthesia Plan: General   Post-op Pain Management:    Induction:   PONV Risk Score and Plan: Propofol infusion and TIVA  Airway Management Planned:   Additional  Equipment:   Intra-op Plan:   Post-operative Plan:   Informed Consent: I have reviewed the patients History and Physical, chart, labs and discussed the procedure including the risks, benefits and alternatives for the proposed anesthesia with the patient or authorized representative who has indicated his/her understanding and acceptance.   Dental Advisory Given  Plan Discussed with: Anesthesiologist, CRNA and Surgeon  Anesthesia Plan Comments:         Anesthesia Quick Evaluation

## 2017-09-27 NOTE — Progress Notes (Signed)
IS education complete, pt understands technique and reason for use, pt inspire 1928ml. Pt independent with use

## 2017-09-27 NOTE — Transfer of Care (Signed)
Immediate Anesthesia Transfer of Care Note  Patient: Bethany Scott  Procedure(s) Performed: TOTAL HIP ARTHROPLASTY ANTERIOR APPROACH (Right )  Patient Location: PACU  Anesthesia Type:Spinal  Level of Consciousness: awake, alert  and oriented  Airway & Oxygen Therapy: Patient Spontanous Breathing and Patient connected to face mask oxygen  Post-op Assessment: Report given to RN and Post -op Vital signs reviewed and stable  Post vital signs: Reviewed and stable  Last Vitals:  Vitals Value Taken Time  BP    Temp    Pulse    Resp 20 09/27/2017  2:10 PM  SpO2    Vitals shown include unvalidated device data.  Last Pain:  Vitals:   09/27/17 1103  TempSrc: Temporal  PainSc: 8          Complications: No apparent anesthesia complications

## 2017-09-27 NOTE — NC FL2 (Signed)
[place] LEVEL OF CARE SCREENING TOOL     IDENTIFICATION  Patient Name: Bethany Scott Birthdate: 30-May-1955 Sex: female Admission Date (Current Location): 09/27/2017  [place] and [place] Number:  Engineering geologist and Address:  [facility], [address] [place], [place] [address]      Provider Number: [id number]  Attending Physician Name and Address:  [name], MD  Relative Name and Phone Number:       Current Level of Care: Hospital Recommended Level of Care: [place] Prior Approval Number:    Date Approved/Denied:   PASRR Number:  [id number]  Discharge Plan: SNF    Current Diagnoses: Patient Active Problem List   Diagnosis Date Noted  . Status post total hip replacement, right 09/27/2017  . Chronic cystitis 02/08/2017  . Hematuria, microscopic 02/08/2017  . History of nephrolithiasis 02/08/2017  . Chronic venous insufficiency 01/06/2017  . Varicose veins of leg with pain, right 12/31/2016  . GERD (gastroesophageal reflux disease) 03/01/2016  . 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 Hip arthritis 05/29/2015  . Degenerative joint disease (DJD) of hip 07/24/2014  . DJD (degenerative joint disease) of knee 06/17/2014  . Greater trochanteric bursitis of both hips 06/17/2014  . Degenerative joint disease of sacroiliac joint 06/17/2014  . Facet syndrome, lumbar 06/17/2014  . DDD (degenerative disc disease), lumbar 06/17/2014  . Low serum vitamin D 06/08/2013  . Sinusitis, acute, maxillary 02/08/2012  . Migraines 02/08/2012  . Depression with anxiety 02/08/2012  . Essential hypertension, benign 02/08/2012  . Pure hypercholesterolemia 02/08/2012  . Diabetes mellitus type 2 in obese ([place]) 02/08/2012    Orientation RESPIRATION BLADDER Height & Weight     Self, Situation, Time, Place  Normal Continent Weight: 191 lb 9.6 oz (86.9 kg) Height:  5\' 7"  (170.2 cm)  BEHAVIORAL SYMPTOMS/MOOD  NEUROLOGICAL BOWEL NUTRITION STATUS      Continent Diet(Diet: Regular )  AMBULATORY STATUS COMMUNICATION OF NEEDS Skin   Extensive Assist Verbally Surgical wounds, Wound Vac(Incision: Right Hip. Provena Wouund Vac. )                       Personal Care Assistance Level of Assistance  Bathing, Feeding, Dressing Bathing Assistance: Limited assistance Feeding assistance: Independent Dressing Assistance: Limited assistance     Functional Limitations Info  Sight, Hearing, Speech Sight Info: Adequate Hearing Info: Adequate Speech Info: Adequate    SPECIAL CARE FACTORS FREQUENCY  PT (By licensed PT), OT (By licensed OT)     PT Frequency: (5) OT Frequency: (5)            Contractures      Additional Factors Info  Code Status, Allergies Code Status Info: (Full Code. ) Allergies Info: (Cephalexin, Cephalosporins, Penicillins)           Current Medications (09/27/2017):  This is the current hospital active medication list Current Facility-Administered Medications  Medication Dose Route Frequency Provider Last Rate Last Dose  . 0.9 %  sodium chloride infusion   Intravenous Continuous [name], MD 100 mL/hr at 09/27/17 1608    . [START ON 09/28/2017] acetaminophen (TYLENOL) tablet 325-650 mg  325-650 mg Oral Q6H PRN [name], MD      . alum & mag hydroxide-simeth (MAALOX/MYLANTA) 200-200-20 MG/5ML suspension 30 mL  30 mL Oral Q4H PRN [name], MD      . Derrill Memo ON 09/28/2017] aspirin EC tablet 325 mg  325 mg Oral Q breakfast [name],  MD      . bisacodyl (DULCOLAX) EC tablet 5 mg  5 mg Oral Daily PRN [name], MD      . budesonide (PULMICORT) nebulizer solution 0.5 mg  0.5 mg Nebulization BID [name], MD      . clindamycin (CLEOCIN) IVPB 600 mg  600 mg Intravenous Q6H [name], MD      . diphenhydrAMINE (BENADRYL) 12.5 MG/5ML elixir 12.5-25 mg  12.5-25 mg Oral Q4H PRN [name], MD      . docusate sodium (COLACE) capsule 100 mg  100 mg Oral  BID [name], MD   100 mg at 09/27/17 1607  . DULoxetine (CYMBALTA) DR capsule 60 mg  60 mg Oral BID [name], MD      . Derrill Memo ON 09/28/2017] fluticasone (FLONASE) 50 MCG/ACT nasal spray 1 spray  1 spray Each Nare Daily [name], MD      . gabapentin (NEURONTIN) capsule 300 mg  300 mg Oral TID [name], MD   300 mg at 09/27/17 1607  . HYDROcodone-acetaminophen (NORCO) 7.5-325 MG per tablet 1-2 tablet  1-2 tablet Oral Q4H PRN [name], MD      . HYDROcodone-acetaminophen (NORCO/VICODIN) 5-325 MG per tablet 1-2 tablet  1-2 tablet Oral Q4H PRN [name], MD   2 tablet at 09/27/17 1606  . [START ON 09/28/2017] loratadine (CLARITIN) tablet 10 mg  10 mg Oral Daily [name], MD      . magnesium citrate solution 1 Bottle  1 Bottle Oral Once PRN [name], MD      . menthol-cetylpyridinium (CEPACOL) lozenge 3 mg  1 lozenge Oral PRN [name], MD       Or  . phenol (CHLORASEPTIC) mouth spray 1 spray  1 spray Mouth/Throat PRN [name], MD      . methocarbamol (ROBAXIN) tablet 500 mg  500 mg Oral Q6H PRN [name], MD       Or  . methocarbamol (ROBAXIN) 500 mg in dextrose 5 % 50 mL IVPB  500 mg Intravenous Q6H PRN [name], MD      . metoCLOPramide (REGLAN) tablet 5-10 mg  5-10 mg Oral Q8H PRN [name], MD       Or  . metoCLOPramide (REGLAN) injection 5-10 mg  5-10 mg Intravenous Q8H PRN [name], MD      . Derrill Memo ON 09/28/2017] montelukast (SINGULAIR) tablet 10 mg  10 mg Oral Daily [name], MD      . morphine 2 MG/ML injection 0.5-1 mg  0.5-1 mg Intravenous Q2H PRN [name], MD      . olopatadine (PATANOL) 0.1 % ophthalmic solution 1 drop  1 drop Both Eyes BID [name], MD      . ondansetron [facility]) tablet 4 mg  4 mg Oral Q6H PRN [name], MD       Or  . ondansetron [facility]) injection 4 mg  4 mg Intravenous Q6H PRN [name], MD      . pantoprazole (PROTONIX) EC tablet 40 mg  40 mg Oral BID [name], MD      .  scopolamine (TRANSDERM-SCOP) 1 MG/3DAYS 1.5 mg  1 patch Transdermal Q72H Molli Barrows, MD   1.5 mg at 09/27/17 1131  . senna-docusate (Senokot-S) tablet 1 tablet  1 tablet Oral QHS PRN [name], MD      . sodium fluoride (FLUORISHIELD) 1.1 % dental gel 1 application  1 application dental BID [name], MD      .  traMADol (ULTRAM) tablet 50 mg  50 mg Oral Q6H [name], MD      . zolpidem [facility]) tablet 5 mg  5 mg Oral QHS PRN [name], MD         Discharge Medications: Please see discharge summary for a list of discharge medications.  Relevant Imaging Results:  Relevant Lab Results:   Additional Information (SSN: [id number])  Sarh Kirschenbaum, Veronia Beets, LCSW

## 2017-09-28 LAB — BASIC METABOLIC PANEL
Anion gap: 5 (ref 5–15)
BUN: 13 mg/dL (ref 8–23)
CALCIUM: 8.5 mg/dL — AB (ref 8.9–10.3)
CHLORIDE: 104 mmol/L (ref 98–111)
CO2: 29 mmol/L (ref 22–32)
CREATININE: 0.49 mg/dL (ref 0.44–1.00)
Glucose, Bld: 134 mg/dL — ABNORMAL HIGH (ref 70–99)
Potassium: 4.5 mmol/L (ref 3.5–5.1)
SODIUM: 138 mmol/L (ref 135–145)

## 2017-09-28 LAB — CBC
HCT: 33.9 % — ABNORMAL LOW (ref 35.0–47.0)
Hemoglobin: 11.9 g/dL — ABNORMAL LOW (ref 12.0–16.0)
MCH: 32.7 pg (ref 26.0–34.0)
MCHC: 35 g/dL (ref 32.0–36.0)
MCV: 93.3 fL (ref 80.0–100.0)
PLATELETS: 224 10*3/uL (ref 150–440)
RBC: 3.63 MIL/uL — AB (ref 3.80–5.20)
RDW: 12.6 % (ref 11.5–14.5)
WBC: 7.4 10*3/uL (ref 3.6–11.0)

## 2017-09-28 NOTE — Evaluation (Signed)
Physical Therapy Evaluation Patient Details Name: Bethany Scott MRN: [id number] DOB: [birth year] Today's Date: 09/28/2017   History of Present Illness  Pt is a 62 yo F who presents with OA of the R hip and is now s/p elective R THA.  PHM includes HTN, PUD, DM, and HLD.      Clinical Impression  Pt presents with deficits in strength, transfers, mobility, gait, and activity tolerance.  The pt required extensive assistance with bed mobility tasks and was limited by R hip pain with functional mobility throughout the session.  The pt was able to stand from an elevated EOB with CGA and cues for sequencing but had significant difficulty advancing the LLE during limited ambulation.  Taking several small steps from the EOB to the recliner was very effortful and required a significant amount of time and encouragement/cueing.  The pt has limited assistance at home during the day along with 4-5 steps to enter the home making a discharge to her previous living situation unsafe at this time.  Pt will benefit from PT services in a SNF setting upon discharge to safely address above deficits for decreased caregiver assistance and eventual return to PLOF.       Follow Up Recommendations SNF    Equipment Recommendations  None recommended by PT    Recommendations for Other Services       Precautions / Restrictions Precautions Precautions: Anterior Hip Precaution Booklet Issued: Yes (comment) Restrictions Weight Bearing Restrictions: Yes RLE Weight Bearing: Weight bearing as tolerated      Mobility  Bed Mobility Overal bed mobility: Needs Assistance Bed Mobility: Supine to Sit;Sit to Supine     Supine to sit: Mod assist Sit to supine: Mod assist   General bed mobility comments: Mod A to mobilize BLEs  Transfers Overall transfer level: Needs assistance Equipment used: Rolling walker (2 wheeled) Transfers: Sit to/from Stand Sit to Stand: Min guard         General transfer comment: Mod  verbal cues for sequencing  Ambulation/Gait Ambulation/Gait assistance: Min guard Gait Distance (Feet): 2 Feet Assistive device: Rolling walker (2 wheeled) Gait Pattern/deviations: Step-to pattern;Decreased stance time - right;Decreased step length - left;Antalgic     General Gait Details: Pt antalgic on the RLE both with and without WB which significantly limited ambulation tolerance  Stairs            Wheelchair Mobility    Modified Rankin (Stroke Patients Only)       Balance Overall balance assessment: No apparent balance deficits (not formally assessed)                                           Pertinent Vitals/Pain Pain Assessment: 0-10 Pain Score: 8  Pain Location: R hip Pain Descriptors / Indicators: Aching;Sore Pain Intervention(s): Premedicated before session;Monitored during session;Limited activity within patient's tolerance    Home Living Family/patient expects to be discharged to:: Private residence Living Arrangements: Spouse/significant other;Children Available Help at Discharge: Available PRN/intermittently;Family Type of Home: House Home Access: Stairs to enter Entrance Stairs-Rails: Psychiatric nurse of Steps: 5 Home Layout: Two level;Able to live on main level with bedroom/bathroom Home Equipment: Bedside commode;Walker - 2 wheels Additional Comments: Pt has a sister who will lend her a BSC and a RW as long as the pt needs them, sister no longer uses the DME     Prior Function Level  of Independence: Independent         Comments: Ind Amb without an AD community distances, Ind with ADLs, no fall history     Hand Dominance        Extremity/Trunk Assessment   Upper Extremity Assessment Upper Extremity Assessment: Overall WFL for tasks assessed    Lower Extremity Assessment Lower Extremity Assessment: Generalized weakness;RLE deficits/detail RLE Deficits / Details: R hip flex strength functionally  <3/5, R knee flex/ext NT secondary to pain RLE: Unable to fully assess due to pain RLE Sensation: WNL       Communication   Communication: No difficulties  Cognition Arousal/Alertness: Awake/alert Behavior During Therapy: WFL for tasks assessed/performed Overall Cognitive Status: Within Functional Limits for tasks assessed                                        General Comments      Exercises Total Joint Exercises Ankle Circles/Pumps: AROM;Both;10 reps;15 reps Quad Sets: Strengthening;Both;5 reps;10 reps Gluteal Sets: Strengthening;Both;5 reps;10 reps Towel Squeeze: Strengthening;Both;5 reps Heel Slides: AAROM;Right;5 reps Long Arc Quad: AROM;Both;10 reps;15 reps Knee Flexion: AROM;Both;10 reps;15 reps Marching in Standing: AROM;Both;5 reps;Standing Other Exercises Other Exercises: Pre-gait weight shifting left/right in standing Other Exercises: HEP education and review per handout Other Exercises: Sit to/from stand and bed mobility training with cues for sequencing   Assessment/Plan    PT Assessment Patient needs continued PT services  PT Problem List Decreased strength;Decreased activity tolerance;Decreased knowledge of use of DME;Decreased mobility       PT Treatment Interventions DME instruction;Gait training;Stair training;Functional mobility training;Balance training;Therapeutic exercise;Therapeutic activities;Patient/family education    PT Goals (Current goals can be found in the Care Plan section)  Acute Rehab PT Goals Patient Stated Goal: To walk better PT Goal Formulation: With patient Time For Goal Achievement: 10/11/17 Potential to Achieve Goals: Good    Frequency BID   Barriers to discharge Inaccessible home environment;Decreased caregiver support Pt has only limited intermittent help at home during the day along with 4+ steps to enter the home    Co-evaluation               AM-PAC PT "6 Clicks" Daily Activity  Outcome  Measure Difficulty turning over in bed (including adjusting bedclothes, sheets and blankets)?: Unable Difficulty moving from lying on back to sitting on the side of the bed? : Unable Difficulty sitting down on and standing up from a chair with arms (e.g., wheelchair, bedside commode, etc,.)?: Unable Help needed moving to and from a bed to chair (including a wheelchair)?: A Lot Help needed walking in hospital room?: Total Help needed climbing 3-5 steps with a railing? : Total 6 Click Score: 7    End of Session Equipment Utilized During Treatment: Gait belt Activity Tolerance: Patient limited by pain Patient left: in chair;with chair alarm set;with call bell/phone within reach;with SCD's reapplied Nurse Communication: Mobility status PT Visit Diagnosis: Muscle weakness (generalized) (M62.81);Other abnormalities of gait and mobility (R26.89);Pain Pain - Right/Left: Right Pain - part of body: Hip    Time: 0086-7619 PT Time Calculation (min) (ACUTE ONLY): 45 min   Charges:   PT Evaluation $PT Eval Low Complexity: 1 Low PT Treatments $Therapeutic Exercise: 8-22 mins $Therapeutic Activity: 8-22 mins        D. Royetta Asal PT, DPT 09/28/17, 12:50 PM

## 2017-09-28 NOTE — Progress Notes (Signed)
   Subjective: 1 Day Post-Op Procedure(s) (LRB): TOTAL HIP ARTHROPLASTY ANTERIOR APPROACH (Right) Patient reports pain as 8 on 0-10 scale.   Patient is well, and has had no acute complaints or problems Denies any CP, SOB, ABD pain. We will continue therapy today.  Plan is to go Home after hospital stay.  Objective: Vital signs in last 24 hours: Temp:  [97.6 F (36.4 C)-98.4 F (36.9 C)] 98.4 F (36.9 C) (09/04 0403) Pulse Rate:  [64-94] 70 (09/04 0403) Resp:  [12-20] 12 (09/04 0403) BP: (91-134)/(55-84) 99/60 (09/04 0403) SpO2:  [96 %-100 %] 97 % (09/04 0403) Weight:  [86.9 kg] 86.9 kg (09/03 1103)  Intake/Output from previous day: 09/03 0701 - 09/04 0700 In: 2580.1 [I.V.:2437.3; IV Piggyback:142.8] Out: 1150 [Urine:650; Blood:500] Intake/Output this shift: No intake/output data recorded.  Recent Labs    09/28/17 0328  HGB 11.9*   Recent Labs    09/28/17 0328  WBC 7.4  RBC 3.63*  HCT 33.9*  PLT 224   Recent Labs    09/28/17 0328  NA 138  K 4.5  CL 104  CO2 29  BUN 13  CREATININE 0.49  GLUCOSE 134*  CALCIUM 8.5*   No results for input(s): LABPT, INR in the last 72 hours.  EXAM General - Patient is Alert, Appropriate and Oriented Extremity - Neurovascular intact Sensation intact distally Intact pulses distally Dorsiflexion/Plantar flexion intact No cellulitis present Compartment soft Dressing - dressing C/D/I and no drainage, wound vac intact Motor Function - intact, moving foot and toes well on exam.   Past Medical History:  Diagnosis Date  . Allergy   . Anemia   . Anxiety   . Arthritis    OA Knee; non-specific autoimmune process followed by [facility]/Rheumatology.  . Asthma   . Blood transfusion without reported diagnosis YRS AGO  . Depression   . Diabetes mellitus without complication ([place])   . Gastric ulcer, unspecified as acute or chronic, without mention of hemorrhage, perforation, or obstruction YRS AGO  . GERD (gastroesophageal  reflux disease)   . Hyperlipidemia   . Hypertension   . Migraine    hx of migraines  . Obesity, unspecified   . Other chronic cystitis   . Personal history of colonic polyps   . PONV (postoperative nausea and vomiting)    NAUSEA, SCOPOLAMINE PATCH HELPED WITH LAST SURGERIES  . Pre-diabetes     Assessment/Plan:   1 Day Post-Op Procedure(s) (LRB): TOTAL HIP ARTHROPLASTY ANTERIOR APPROACH (Right) Active Problems:   Status post total hip replacement, right  Estimated body mass index is 30.01 kg/m as calculated from the following:   Height as of this encounter: 5\' 7"  (1.702 m).   Weight as of this encounter: 86.9 kg. Advance diet Up with therapy  Needs BM Recheck labs in the am Hypotension - continue to monitor. Asymptomatic.  CM to assist with discharge   DVT Prophylaxis - Aspirin, TED hose and SCDs Weight-Bearing as tolerated to left leg   T. Rachelle Hora, PA-C [place] 09/28/2017, 7:51 AM

## 2017-09-28 NOTE — Progress Notes (Signed)
Physical Therapy Treatment Patient Details Name: Bethany Scott MRN: [id number] DOB: 1955-10-31 Today's Date: 09/28/2017    History of Present Illness Pt is a 62 yo F who presents with OA of the R hip and is now s/p elective R THA.  PHM includes HTN, PUD, DM, and HLD.      PT Comments    Pt presents with deficits in strength, transfers, mobility, gait, and activity tolerance but was able to make some progress most notably with ambulation this session.  Pt continues to required CGA, verbal cues for sequencing, and extra time/effort during transfers but no physical assistance.  Pt remained very antalgic in standing with R hip pain increasing from baseline of 8/10 to 9-10/10 but with pt verbalizing desire to ambulate despite stated pain level.  Pt was able to amb around 3 x 6' with a RW and CGA with very slow, effortful cadence and with mod verbal cues for step-to sequencing to assist with pain control.   Pt required therapeutic standing rest breaks frequently during amb but reported no adverse symptoms other than R hip pain.  Pt will benefit from PT services in a SNF setting upon discharge to safely address above deficits for decreased caregiver assistance and eventual return to PLOF.  Pt will benefit from PT services in a SNF setting upon discharge to safely address above deficits for decreased caregiver assistance and eventual return to PLOF.     Follow Up Recommendations  SNF     Equipment Recommendations  None recommended by PT    Recommendations for Other Services       Precautions / Restrictions Precautions Precautions: Anterior Hip Precaution Booklet Issued: Yes (comment) Restrictions Weight Bearing Restrictions: Yes RLE Weight Bearing: Weight bearing as tolerated    Mobility  Bed Mobility Overal bed mobility: Needs Assistance Bed Mobility: Supine to Sit;Sit to Supine     Supine to sit: Mod assist Sit to supine: Mod assist   General bed mobility comments: deferred, up in  recliner  Transfers Overall transfer level: Needs assistance Equipment used: Rolling walker (2 wheeled) Transfers: Sit to/from Stand Sit to Stand: Min guard         General transfer comment: Min verbal cues for sequencing  Ambulation/Gait Ambulation/Gait assistance: Min guard Gait Distance (Feet): 6 Feet Assistive device: Rolling walker (2 wheeled) Gait Pattern/deviations: Step-to pattern;Decreased stance time - right;Decreased step length - left;Antalgic Gait velocity: Decreased   General Gait Details: Pt antalgic on the RLE both with and without WB which significantly limited ambulation tolerance but improved from previous session   Stairs             Wheelchair Mobility    Modified Rankin (Stroke Patients Only)       Balance Overall balance assessment: No apparent balance deficits (not formally assessed)                                          Cognition Arousal/Alertness: Awake/alert Behavior During Therapy: WFL for tasks assessed/performed Overall Cognitive Status: Within Functional Limits for tasks assessed                                        Exercises Total Joint Exercises Ankle Circles/Pumps: AROM;Both;10 reps;15 reps Quad Sets: Strengthening;Both;10 reps Gluteal Sets: Strengthening;Both;10 reps Towel Squeeze: Strengthening;Both;10  reps Heel Slides: AAROM;Right;5 reps Long Arc Quad: AROM;Both;10 reps;15 reps Knee Flexion: AROM;Both;10 reps;15 reps Marching in Standing: AROM;Both;Standing;10 reps Other Exercises Other Exercises: Pre-gait weight shifting left/right in standing Other Exercises: HEP education and review per handout Other Exercises: Sit to/from stand training with cues for sequencing Other Exercises: Pt educated in falls prevention strategies including proper footwear to minimize risk of falls at home Other Exercises: Pt educated in RW use and positioning within bathroom and kitchen  environments    General Comments        Pertinent Vitals/Pain Pain Assessment: 0-10 Pain Score: 8  Pain Location: R hip Pain Descriptors / Indicators: Aching;Sore Pain Intervention(s): Premedicated before session;Monitored during session    [place] expects to be discharged to:: Private residence Living Arrangements: Spouse/significant other;Children(25yo son) Available Help at Discharge: Available PRN/intermittently;Family(both work, can help in/out during the day) Type of Home: House Home Access: Stairs to enter Entrance Stairs-Rails: Right;Left Home Layout: Two level;Able to live on main level with bedroom/bathroom;1/2 bath on main level Home Equipment: Bedside commode;Walker - 2 wheels Additional Comments: Pt has a sister who will lend her a BSC and a RW as long as the pt needs them, sister no longer uses the DME     Prior Function Level of Independence: Independent      Comments: Ind Amb without an AD community distances, Ind with ADLs, no fall history. Works full time, Network engineer job   PT Goals (current goals can now be found in the care plan section) Acute Rehab PT Goals Patient Stated Goal: have less pain and get back to PLOF PT Goal Formulation: With patient Time For Goal Achievement: 10/11/17 Potential to Achieve Goals: Good Progress towards PT goals: Progressing toward goals    Frequency    BID      PT Plan Current plan remains appropriate    Co-evaluation              AM-PAC PT "6 Clicks" Daily Activity  Outcome Measure  Difficulty turning over in bed (including adjusting bedclothes, sheets and blankets)?: Unable Difficulty moving from lying on back to sitting on the side of the bed? : Unable Difficulty sitting down on and standing up from a chair with arms (e.g., wheelchair, bedside commode, etc,.)?: Unable Help needed moving to and from a bed to chair (including a wheelchair)?: A Lot Help needed walking in hospital room?:  Total Help needed climbing 3-5 steps with a railing? : Total 6 Click Score: 7    End of Session Equipment Utilized During Treatment: Gait belt Activity Tolerance: Patient limited by pain Patient left: in chair;with chair alarm set;with call bell/phone within reach;with SCD's reapplied;with family/visitor present Nurse Communication: Mobility status PT Visit Diagnosis: Muscle weakness (generalized) (M62.81);Other abnormalities of gait and mobility (R26.89);Pain Pain - Right/Left: Right Pain - part of body: Hip     Time: 5277-8242 PT Time Calculation (min) (ACUTE ONLY): 40 min  Charges:  $Gait Training: 23-37 mins $Therapeutic Exercise: 8-22 mins $Therapeutic Activity: 8-22 mins                     D. Scott Ayyub Krall PT, DPT 09/28/17, 3:15 PM

## 2017-09-28 NOTE — Progress Notes (Signed)
Chaplain responded to an OR for an AD. Chaplain educated Pt on ED. Patient was in considerable pain. Chaplain prayed for Pt's pain and familyeducated Pt on chaplain services and availability.    09/28/17 0900  Clinical Encounter Type  Visited With Patient  Visit Type Initial;Spiritual support  Referral From Nurse  Spiritual Encounters  Spiritual Needs Brochure;Prayer

## 2017-09-28 NOTE — Clinical Social Work Note (Signed)
Clinical Social Work Assessment  Patient Details  Name: Bethany Scott MRN: [id number] Date of Birth: 06/29/1955  Date of referral:  09/28/17               Reason for consult:  Facility Placement                Permission sought to share information with:    Permission granted to share information::     Name::        Agency::     Relationship::     Contact Information:     Housing/Transportation Living arrangements for the past 2 months:  Single Family Home Source of Information:  Patient Patient Interpreter Needed:  None Criminal Activity/Legal Involvement Pertinent to Current Situation/Hospitalization:  No - Comment as needed Significant Relationships:  Adult Children, Spouse Lives with:  Spouse, Adult Children Do you feel safe going back to the place where you live?  Yes Need for family participation in patient care:  Yes (Comment)  Care giving concerns:  Patient lives in [place] with her husband Bethany Scott of 62 years.    Social Worker assessment / plan:  Holiday representative (CSW) received SNF consult. PT is recommending SNF. CSW met with patient alone at bedside to discuss D/C plan. Patient was alert and oriented X4 and was sitting up in the chair at bedside. CSW introduced self and explained role of CSW department. Per patient she works full time at [employer] and has [employer] for her insurance. Patient reported that her husband and her 32 y.o son Bethany Scott live with her. CSW explained SNF process and that [employer] will have to approve it. Patient reported that she does not want to go to SNF and will go home. Patient reported that she doesn't need SNF and will do well at home. RN case manager aware of above. CSW will continue to follow and assist as needed.   Employment status:  Therapist, music:  Managed Care PT Recommendations:  [place] / Referral to community resources:  Other (Comment Required)(Patient refused SNF.  )  Patient/Family's Response to care:  Patient refused SNF.   Patient/Family's Understanding of and Emotional Response to Diagnosis, Current Treatment, and Prognosis:  Patient was pleasant and thanked CSW for assistance.   Emotional Assessment Appearance:  Appears stated age Attitude/Demeanor/Rapport:    Affect (typically observed):  Accepting, Adaptable, Pleasant Orientation:  Oriented to Self, Oriented to Place, Oriented to  Time, Oriented to Situation Alcohol / Substance use:  Not Applicable Psych involvement (Current and /or in the community):  No (Comment)  Discharge Needs  Concerns to be addressed:  Discharge Planning Concerns Readmission within the last 30 days:  No Current discharge risk:  Dependent with Mobility Barriers to Discharge:  Continued Medical Work up   [employer], Veronia Beets, LCSW 09/28/2017, 3:08 PM

## 2017-09-28 NOTE — Evaluation (Signed)
Occupational Therapy Evaluation Patient Details Name: Bethany Scott MRN: [id number] DOB: 08-May-1955 Today's Date: 09/28/2017    History of Present Illness Pt is a 62 yo F who presents with OA of the R hip and is now s/p elective R THA.  PHM includes HTN, PUD, DM, and HLD.     Clinical Impression   Pt seen for OT evaluation this date, POD#1 from above surgery. Pt was independent in all ADLs prior to surgery due to R hip pain. Pt is eager to return to PLOF with less pain and improved safety and independence. Pt currently requires mod assist for LB dressing and bathing while in seated position due to pain and limited AROM of R hip. Pt instructed in self care skills, falls prevention strategies, home/routines modifications, DME/AE for LB bathing and dressing tasks, and compression stocking mgt strategies. Pt educated in RW use within kitchen and bathroom environments to maximize independence and safety. Pt limited by pain this date, unable to tolerate mobility assessment after PT evaluation. Pt would benefit from additional instruction in self care skills and techniques to help maintain precautions with or without assistive devices to support recall and carryover prior to discharge. Recommend STR upon discharge but anticipate pt's progress will improve with better pain control mgt.     Follow Up Recommendations  SNF    Equipment Recommendations  Other (comment)(reacher)    Recommendations for Other Services       Precautions / Restrictions Precautions Precautions: Anterior Hip Precaution Booklet Issued: Yes (comment) Restrictions Weight Bearing Restrictions: Yes RLE Weight Bearing: Weight bearing as tolerated      Mobility Bed Mobility   General bed mobility comments: deferred, up in recliner  Transfers Overall transfer level: Needs assistance         General transfer comment: pt declined 2/2 9/10 pain in R hip, recently had worked with PT    Balance Overall balance  assessment: No apparent balance deficits (not formally assessed)                                         ADL either performed or assessed with clinical judgement   ADL Overall ADL's : Needs assistance/impaired Eating/Feeding: Sitting;Independent   Grooming: Sitting;Independent   Upper Body Bathing: Sitting;Modified independent   Lower Body Bathing: Sit to/from stand;Moderate assistance   Upper Body Dressing : Sitting;Modified independent   Lower Body Dressing: Sit to/from stand;Moderate assistance Lower Body Dressing Details (indicate cue type and reason): pt educated in AE for LB dressing and compression stocking mgt                     Vision Baseline Vision/History: Wears glasses Wears Glasses: Distance only Patient Visual Report: No change from baseline       Perception     Praxis      Pertinent Vitals/Pain Pain Assessment: 0-10 Pain Score: 9  Pain Location: R hip Pain Descriptors / Indicators: Aching;Sore Pain Intervention(s): Limited activity within patient's tolerance;Monitored during session;Premedicated before session     Hand Dominance     Extremity/Trunk Assessment Upper Extremity Assessment Upper Extremity Assessment: Overall WFL for tasks assessed   Lower Extremity Assessment Lower Extremity Assessment: Defer to PT evaluation;RLE deficits/detail RLE Deficits / Details: R hip flex strength functionally <3/5, R knee flex/ext NT secondary to pain RLE: Unable to fully assess due to pain RLE Sensation: WNL  Cervical / Trunk Assessment Cervical / Trunk Assessment: Normal   Communication Communication Communication: No difficulties   Cognition Arousal/Alertness: Awake/alert Behavior During Therapy: WFL for tasks assessed/performed Overall Cognitive Status: Within Functional Limits for tasks assessed                                     General Comments       Exercises Total Joint Exercises Other  Exercises Other Exercises: Pt educated in falls prevention strategies including proper footwear to minimize risk of falls at home Other Exercises: Pt educated in RW use and positioning within bathroom and kitchen environments   Shoulder Instructions      Home Living Family/patient expects to be discharged to:: Private residence Living Arrangements: Spouse/significant other;Children(25yo son) Available Help at Discharge: Available PRN/intermittently;Family(both work, can help in/out during the day) Type of Home: House Home Access: Stairs to enter [employer] of Steps: 5 Entrance Stairs-Rails: Right;Left Home Layout: Two level;Able to live on main level with bedroom/bathroom;1/2 bath on main level     Bathroom Shower/Tub: Occupational psychologist: Standard     Home Equipment: Bedside commode;Walker - 2 wheels   Additional Comments: Pt has a sister who will lend her a BSC and a RW as long as the pt needs them, sister no longer uses the DME       Prior Functioning/Environment Level of Independence: Independent        Comments: Ind Amb without an AD community distances, Ind with ADLs, no fall history. Works full time, Network engineer job        OT Problem List: Decreased strength;Decreased knowledge of use of DME or AE;Pain      OT Treatment/Interventions: Self-care/ADL training;Therapeutic exercise;Therapeutic activities;DME and/or AE instruction;Patient/family education    OT Goals(Current goals can be found in the care plan section) Acute Rehab OT Goals Patient Stated Goal: have less pain and get back to PLOF OT Goal Formulation: With patient Time For Goal Achievement: 10/12/17 Potential to Achieve Goals: Good ADL Goals Pt Will Perform Lower Body Dressing: with modified independence;sit to/from stand;with adaptive equipment Pt Will Transfer to Toilet: with modified independence;ambulating(BSC over toilet, LRAD for amb) Additional ADL Goal #1: Pt will  independently instruct family in compression stocking mgt including wear schedule, donning/doffing, and positioning.  OT Frequency: Min 1X/week   Barriers to D/C:            Co-evaluation              AM-PAC PT "6 Clicks" Daily Activity     Outcome Measure Help from another person eating meals?: None Help from another person taking care of personal grooming?: None Help from another person toileting, which includes using toliet, bedpan, or urinal?: A Lot Help from another person bathing (including washing, rinsing, drying)?: A Lot Help from another person to put on and taking off regular upper body clothing?: None Help from another person to put on and taking off regular lower body clothing?: A Lot 6 Click Score: 18   End of Session    Activity Tolerance: Patient limited by pain Patient left: in chair;with call bell/phone within reach;with chair alarm set;with SCD's reapplied  OT Visit Diagnosis: Other abnormalities of gait and mobility (R26.89);Pain Pain - Right/Left: Right Pain - part of body: Hip                Time: 0814-4818 OT Time Calculation (min):  15 min Charges:  OT General Charges $OT Visit: 1 Visit OT Evaluation $OT Eval Low Complexity: 1 Low OT Treatments $Self Care/Home Management : 8-22 mins  Jeni Salles, MPH, MS, OTR/L ascom [phone] 09/28/17, 1:08 PM

## 2017-09-29 LAB — CBC
HCT: 30.4 % — ABNORMAL LOW (ref 35.0–47.0)
Hemoglobin: 10.5 g/dL — ABNORMAL LOW (ref 12.0–16.0)
MCH: 32.5 pg (ref 26.0–34.0)
MCHC: 34.6 g/dL (ref 32.0–36.0)
MCV: 94 fL (ref 80.0–100.0)
PLATELETS: 188 10*3/uL (ref 150–440)
RBC: 3.24 MIL/uL — ABNORMAL LOW (ref 3.80–5.20)
RDW: 12.5 % (ref 11.5–14.5)
WBC: 7.4 10*3/uL (ref 3.6–11.0)

## 2017-09-29 LAB — BASIC METABOLIC PANEL
ANION GAP: 5 (ref 5–15)
BUN: 8 mg/dL (ref 8–23)
CO2: 27 mmol/L (ref 22–32)
Calcium: 8.3 mg/dL — ABNORMAL LOW (ref 8.9–10.3)
Chloride: 109 mmol/L (ref 98–111)
Creatinine, Ser: 0.4 mg/dL — ABNORMAL LOW (ref 0.44–1.00)
GFR calc Af Amer: >60 mL/min (ref >60)
GLUCOSE: 124 mg/dL — AB (ref 70–99)
Potassium: 4 mmol/L (ref 3.5–5.1)
Sodium: 141 mmol/L (ref 135–145)

## 2017-09-29 MED ORDER — OXYCODONE HCL 5 MG PO TABS: 5.0000 mg | ORAL_TABLET | ORAL | 0 refills | Status: DC | PRN

## 2017-09-29 MED ORDER — METHOCARBAMOL 500 MG PO TABS: 500.0000 mg | ORAL_TABLET | Freq: Four times a day (QID) | ORAL | 0 refills | Status: DC | PRN

## 2017-09-29 MED ORDER — OXYCODONE HCL 5 MG PO TABS
5.0000 mg | ORAL_TABLET | ORAL | Status: DC | PRN
  Administered 2017-09-29 – 2017-09-30 (×5): 10 mg via ORAL
  Administered 2017-09-30: 5 mg via ORAL
  Filled 2017-09-29 (×6): qty 2

## 2017-09-29 MED ORDER — MAGNESIUM HYDROXIDE 400 MG/5ML PO SUSP
30.0000 mL | Freq: Once | ORAL | Status: DC
  Filled 2017-09-29: qty 30

## 2017-09-29 MED ORDER — ASPIRIN 325 MG PO TBEC: 325.0000 mg | DELAYED_RELEASE_TABLET | Freq: Every day | ORAL | 0 refills | Status: AC

## 2017-09-29 MED ORDER — BISACODYL 10 MG RE SUPP
10.0000 mg | Freq: Every day | RECTAL | Status: DC | PRN
  Administered 2017-09-30: 10 mg via RECTAL
  Filled 2017-09-29: qty 1

## 2017-09-29 NOTE — Care Management (Signed)
30 Day Unplanned Readmission Risk Score     Admission (Current) from 09/27/2017 in [facility] (1A)  30 Day Unplanned Readmission Risk Score (%)  12 Filed at 09/29/2017 1200     This score is the patient's risk of an unplanned readmission within 30 days of being discharged (0 -100%). The score is based on dignosis, age, lab data, medications, orders, and past utilization.   Low:  0-14.9   Medium: 15-21.9   High: 22-29.9   Extreme: 30 and above        Readmission Risk Prevention Plan 09/29/2017  Medication Screening Complete  Transportation Screening Complete  Some recent data might be hidden

## 2017-09-29 NOTE — Progress Notes (Signed)
Physical Therapy Treatment Patient Details Name: Bethany Scott MRN: [id number] DOB: 08-06-1955 Today's Date: 09/29/2017    History of Present Illness Pt is a 62 yo F who presents with OA of the R hip and is now s/p elective R THA.  PHM includes HTN, PUD, DM, and HLD.      PT Comments    Pt presents with deficits in strength, transfers, mobility, gait, and activity tolerance.  Pt continues to required CGA and extra time and effort with transfers.  Pt was able to amb 2 x 10 feet this session with a RW and CGA with very slow cadence.  Pt required 4 min 20 sec to amb 10' and had difficulty at times clearing the floor while advancing her RLE.  Pt has only intermittent help at home along with multiple steps to enter putting both the pt and caregiver at risk based on the pt's current level of functional mobility.  Pt will benefit from PT services in a SNF setting upon discharge to safely address above deficits for decreased caregiver assistance and eventual return to PLOF.     Follow Up Recommendations  SNF     Equipment Recommendations  None recommended by PT    Recommendations for Other Services       Precautions / Restrictions Precautions Precautions: Anterior Hip Precaution Booklet Issued: Yes (comment) Restrictions Weight Bearing Restrictions: Yes RLE Weight Bearing: Weight bearing as tolerated    Mobility  Bed Mobility               General bed mobility comments: deferred, up in recliner  Transfers Overall transfer level: Needs assistance Equipment used: Rolling walker (2 wheeled) Transfers: Sit to/from Stand Sit to Stand: Min guard         General transfer comment: Extra time and effort with transfers but no physical assistance required  Ambulation/Gait Ambulation/Gait assistance: Min guard Gait Distance (Feet): 10 Feet x 2 Assistive device: Rolling walker (2 wheeled) Gait Pattern/deviations: Step-to pattern;Decreased stance time - right;Decreased step length  - left;Antalgic Gait velocity: Decreased   General Gait Details: Very slow cadence during amb with difficulty clearing the R foot from the floor   Stairs             Wheelchair Mobility    Modified Rankin (Stroke Patients Only)       Balance Overall balance assessment: No apparent balance deficits (not formally assessed)                                          Cognition Arousal/Alertness: Awake/alert Behavior During Therapy: WFL for tasks assessed/performed Overall Cognitive Status: Within Functional Limits for tasks assessed                                        Exercises Total Joint Exercises Ankle Circles/Pumps: AROM;Both;10 reps;15 reps Quad Sets: Strengthening;Both;10 reps Towel Squeeze: Strengthening;Both;10 reps Long Arc Quad: AROM;Both;10 reps;15 reps Knee Flexion: AROM;Both;10 reps;15 reps Marching in Standing: AROM;Both;Standing;10 reps;15 reps Other Exercises Other Exercises: HEP education and review per handout    General Comments        Pertinent Vitals/Pain Pain Assessment: 0-10 Pain Score: 4  Pain Location: R hip Pain Descriptors / Indicators: Aching;Sore Pain Intervention(s): Premedicated before session;Monitored during session    Home Living  Prior Function            PT Goals (current goals can now be found in the care plan section) Progress towards PT goals: Progressing toward goals    Frequency    BID      PT Plan Current plan remains appropriate    Co-evaluation              AM-PAC PT "6 Clicks" Daily Activity  Outcome Measure                   End of Session Equipment Utilized During Treatment: Gait belt Activity Tolerance: Patient limited by pain Patient left: in chair;with chair alarm set;with call bell/phone within reach;with SCD's reapplied Nurse Communication: Mobility status PT Visit Diagnosis: Muscle weakness (generalized)  (M62.81);Other abnormalities of gait and mobility (R26.89);Pain Pain - Right/Left: Right Pain - part of body: Hip     Time: 3976-7341 PT Time Calculation (min) (ACUTE ONLY): 40 min  Charges:  $Gait Training: 23-37 mins $Therapeutic Exercise: 8-22 mins                     D. Scott Shields Pautz PT, DPT 09/29/17, 11:55 AM

## 2017-09-29 NOTE — Progress Notes (Signed)
   Subjective: 2 Days Post-Op Procedure(s) (LRB): TOTAL HIP ARTHROPLASTY ANTERIOR APPROACH (Right) Patient reports pain as 7 on 0-10 scale.  Feeling better today than yesterday. Patient is well, and has had no acute complaints or problems Denies any CP, SOB, ABD pain. We will continue therapy today.  Plan is to go Home after hospital stay.  Objective: Vital signs in last 24 hours: Temp:  [98 F (36.7 C)-98.8 F (37.1 C)] 98.8 F (37.1 C) (09/05 0722) Pulse Rate:  [69-83] 80 (09/05 0722) Resp:  [15-16] 15 (09/04 2256) BP: (98-128)/(53-61) 100/54 (09/05 0722) SpO2:  [97 %-100 %] 97 % (09/05 0745)  Intake/Output from previous day: 09/04 0701 - 09/05 0700 In: 2210.4 [P.O.:240; I.V.:1970.4] Out: 275 [Urine:275] Intake/Output this shift: No intake/output data recorded.  Recent Labs    09/28/17 0328 09/29/17 0318  HGB 11.9* 10.5*   Recent Labs    09/28/17 0328 09/29/17 0318  WBC 7.4 7.4  RBC 3.63* 3.24*  HCT 33.9* 30.4*  PLT 224 188   Recent Labs    09/28/17 0328 09/29/17 0318  NA 138 141  K 4.5 4.0  CL 104 109  CO2 29 27  BUN 13 8  CREATININE 0.49 0.40*  GLUCOSE 134* 124*  CALCIUM 8.5* 8.3*   No results for input(s): LABPT, INR in the last 72 hours.  EXAM General - Patient is Alert, Appropriate and Oriented Extremity - Neurovascular intact Sensation intact distally Intact pulses distally Dorsiflexion/Plantar flexion intact No cellulitis present Compartment soft Dressing - dressing C/D/I and no drainage, wound vac intact Motor Function - intact, moving foot and toes well on exam.   Past Medical History:  Diagnosis Date  . Allergy   . Anemia   . Anxiety   . Arthritis    OA Knee; non-specific autoimmune process followed by [facility]/Rheumatology.  . Asthma   . Blood transfusion without reported diagnosis YRS AGO  . Depression   . Diabetes mellitus without complication ([place])   . Gastric ulcer, unspecified as acute or chronic, without mention  of hemorrhage, perforation, or obstruction YRS AGO  . GERD (gastroesophageal reflux disease)   . Hyperlipidemia   . Hypertension   . Migraine    hx of migraines  . Obesity, unspecified   . Other chronic cystitis   . Personal history of colonic polyps   . PONV (postoperative nausea and vomiting)    NAUSEA, SCOPOLAMINE PATCH HELPED WITH LAST SURGERIES  . Pre-diabetes     Assessment/Plan:   2 Days Post-Op Procedure(s) (LRB): TOTAL HIP ARTHROPLASTY ANTERIOR APPROACH (Right) Active Problems:   Status post total hip replacement, right  Estimated body mass index is 30.01 kg/m as calculated from the following:   Height as of this encounter: 5\' 7"  (1.702 m).   Weight as of this encounter: 86.9 kg. Advance diet Up with therapy  Needs BM Labs stable DC norco, start oxycodone. Patient denies relief with Norco CM to assist with discharge   DVT Prophylaxis - Aspirin, TED hose and SCDs Weight-Bearing as tolerated to left leg   T. Rachelle Hora, PA-C [place] 09/29/2017, 7:55 AM

## 2017-09-29 NOTE — Discharge Summary (Addendum)
Physician Discharge Summary  Patient ID: Bethany Scott MRN: [id number] DOB/AGE: [birth year] 62 y.o.  Admit date: 09/27/2017 Discharge date:09/30/2017 Admission Diagnoses:  PRIMARY LOCALIZED OSTEOARTHRITIS OF RIGHT HIP   Discharge Diagnoses: Patient Active Problem List   Diagnosis Date Noted  . Status post total hip replacement, right 09/27/2017  . Chronic cystitis 02/08/2017  . Hematuria, microscopic 02/08/2017  . History of nephrolithiasis 02/08/2017  . Chronic venous insufficiency 01/06/2017  . Varicose veins of leg with pain, right 12/31/2016  . GERD (gastroesophageal reflux disease) 03/01/2016  . 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 Hip arthritis 05/29/2015  . Degenerative joint disease (DJD) of hip 07/24/2014  . DJD (degenerative joint disease) of knee 06/17/2014  . Greater trochanteric bursitis of both hips 06/17/2014  . Degenerative joint disease of sacroiliac joint 06/17/2014  . Facet syndrome, lumbar 06/17/2014  . DDD (degenerative disc disease), lumbar 06/17/2014  . Low serum vitamin D 06/08/2013  . Sinusitis, acute, maxillary 02/08/2012  . Migraines 02/08/2012  . Depression with anxiety 02/08/2012  . Essential hypertension, benign 02/08/2012  . Pure hypercholesterolemia 02/08/2012  . Diabetes mellitus type 2 in obese ([place]) 02/08/2012    Past Medical History:  Diagnosis Date  . Allergy   . Anemia   . Anxiety   . Arthritis    OA Knee; non-specific autoimmune process followed by [facility]Rheumatology.  . Asthma   . Blood transfusion without reported diagnosis YRS AGO  . Depression   . Diabetes mellitus without complication ([place])   . Gastric ulcer, unspecified as acute or chronic, without mention of hemorrhage, perforation, or obstruction YRS AGO  . GERD (gastroesophageal reflux disease)   . Hyperlipidemia   . Hypertension   . Migraine    hx of migraines  . Obesity, unspecified   . Other chronic cystitis   . Personal history of colonic  polyps   . PONV (postoperative nausea and vomiting)    NAUSEA, SCOPOLAMINE PATCH HELPED WITH LAST SURGERIES  . Pre-diabetes      Transfusion: none   Consultants (if any):   Discharged Condition: Improved  Hospital Course: Bethany Scott is an 62 y.o. female who was admitted 09/27/2017 with a diagnosis of hip osteoarthritis  and went to the operating room on 09/27/2017 and underwent the above named procedures.    Surgeries: Procedure(s): TOTAL HIP ARTHROPLASTY ANTERIOR APPROACH on 09/27/2017 Patient tolerated the surgery well. Taken to PACU where she was stabilized and then transferred to the orthopedic floor.  Started on aspirin TEDS SCDs.  Heels elevated on bed with rolled towels. No evidence of DVT. Negative [place]. Physical therapy started on day #1 for gait training and transfer. OT started day #1 for ADL and assisted devices.  Patient's foley was d/c on day #1. Patient's IV  was d/c on day #2.  On post op day #3 patient was stable and ready for discharge to SNF.  Please remove provena negative pressure dressing on 10/07/2017 and apply honey comb dressing. Keep dressing clean and dry at all times.   Implants: Medacta AMIS 2 standard stem, 54 mm Mpact cup DM liner and S 28 mm ceramic head  She was given perioperative antibiotics:  Anti-infectives (From admission, onward)   Start     Dose/Rate Route Frequency Ordered Stop   09/27/17 1830  clindamycin (CLEOCIN) IVPB 600 mg     600 mg 100 mL/hr over 30 Minutes Intravenous Every 6 hours 09/27/17 1540 09/28/17 0504   09/27/17 1040  clindamycin (CLEOCIN) 900 MG/50ML IVPB  Note to Pharmacy:  Myles Lipps   : cabinet override      09/27/17 1040 09/27/17 1231   09/26/17 2200  clindamycin (CLEOCIN) IVPB 900 mg     900 mg 100 mL/hr over 30 Minutes Intravenous  Once 09/26/17 2148 09/27/17 1241    .  She was given sequential compression devices, early ambulation, and Aspirin for DVT prophylaxis.  She benefited maximally from the  hospital stay and there were no complications.    Recent vital signs:  Vitals:   09/29/17 2330 09/30/17 0751  BP: (!) 108/51   Pulse: 80 83  Resp: 17 18  Temp: 99.3 F (37.4 C)   SpO2: 100% 100%    Recent laboratory studies:  Lab Results  Component Value Date   HGB 10.5 (L) 09/29/2017   HGB 11.9 (L) 09/28/2017   HGB 14.3 09/14/2017   Lab Results  Component Value Date   WBC 7.4 09/29/2017   PLT 188 09/29/2017   Lab Results  Component Value Date   INR 1.02 09/14/2017   Lab Results  Component Value Date   NA 141 09/29/2017   K 4.0 09/29/2017   CL 109 09/29/2017   CO2 27 09/29/2017   BUN 8 09/29/2017   CREATININE 0.40 (L) 09/29/2017   GLUCOSE 124 (H) 09/29/2017    Discharge Medications:   Allergies as of 09/30/2017      Reactions   Cephalexin Anaphylaxis   Cephalosporins Anaphylaxis   Penicillins Anaphylaxis   Has patient had a PCN reaction causing immediate rash, facial/tongue/throat swelling, SOB or lightheadedness with hypotension: Yes Has patient had a PCN reaction causing severe rash involving mucus membranes or skin necrosis: No Has patient had a PCN reaction that required hospitalization Yes Has patient had a PCN reaction occurring within the last 10 years: No If all of the above answers are "NO", then may proceed with Cephalosporin use.      Medication List    STOP taking these medications   aspirin-acetaminophen-caffeine 250-250-65 MG tablet Commonly known as:  EXCEDRIN MIGRAINE     TAKE these medications   aspirin 325 MG EC tablet Take 1 tablet (325 mg total) by mouth daily with breakfast.   azithromycin 250 MG tablet Commonly known as:  ZITHROMAX Take 1 tablet (250 mg total) by mouth daily. 2 tabs today, then 1 a day x 4 more days   cetirizine 10 MG tablet Commonly known as:  ZYRTEC Take 10 mg by mouth 2 (two) times daily.   conjugated estrogens vaginal cream Commonly known as:  PREMARIN Place 1 Applicatorful vaginally at bedtime. What  changed:  when to take this   diclofenac 1.3 % Ptch Commonly known as:  FLECTOR Apply one patch or a portion of patch to painful area of skin twice per day if tolerated What changed:    how much to take  how to take this  when to take this  reasons to take this   diclofenac sodium 1 % Gel Commonly known as:  VOLTAREN Apply 1 application topically daily as needed for pain.   doxycycline 100 MG tablet Commonly known as:  VIBRA-TABS Take 1 tablet (100 mg total) by mouth 2 (two) times daily.   DULoxetine 60 MG capsule Commonly known as:  CYMBALTA Take 1 capsule (60 mg total) by mouth 2 (two) times daily.   gabapentin 300 MG capsule Commonly known as:  NEURONTIN Take 1 capsule (300 mg total) by mouth 3 (three) times daily.   hydrocortisone 25 MG  suppository Commonly known as:  ANUSOL-HC Place 1 suppository (25 mg total) rectally 2 (two) times daily as needed for hemorrhoids.   IRON PO Take 1 tablet by mouth 2 (two) times daily.   levofloxacin 750 MG tablet Commonly known as:  LEVAQUIN Take 1 tablet (750 mg total) by mouth daily.   methocarbamol 500 MG tablet Commonly known as:  ROBAXIN Take 1 tablet (500 mg total) by mouth every 6 (six) hours as needed for muscle spasms.   mometasone 50 MCG/ACT nasal spray Commonly known as:  NASONEX Place 2 sprays into the nose daily. What changed:    how much to take  when to take this   montelukast 10 MG tablet Commonly known as:  SINGULAIR Take 1 tablet (10 mg total) by mouth daily.   nitrofurantoin (macrocrystal-monohydrate) 100 MG capsule Commonly known as:  MACROBID Take 1 capsule (100 mg total) by mouth 2 (two) times daily.   Olopatadine HCl 0.2 % Soln Apply 1 drop to eye daily.   oxyCODONE 5 MG immediate release tablet Commonly known as:  Oxy IR/ROXICODONE Take 1-2 tablets (5-10 mg total) by mouth every 4 (four) hours as needed for moderate pain or severe pain.   pantoprazole 40 MG tablet Commonly known as:   PROTONIX TAKE 1 TABLET BY MOUTH TWO TIMES DAILY   phenazopyridine 97 MG tablet Commonly known as:  PYRIDIUM Take 97-194 mg by mouth daily as needed for pain.   PREVIDENT 5000 DRY MOUTH 1.1 % Gel dental gel Generic drug:  sodium fluoride Place 1 application onto teeth 2 (two) times daily.   promethazine 25 MG tablet Commonly known as:  PHENERGAN Take 1 tablet (25 mg total) by mouth every 8 (eight) hours as needed for nausea or vomiting.   QVAR 80 MCG/ACT inhaler Generic drug:  beclomethasone Inhale 2 puffs into the lungs daily.   traMADol 50 MG tablet Commonly known as:  ULTRAM Take 1 tablet (50 mg total) by mouth every 6 (six) hours.            Durable Medical Equipment  (From admission, onward)         Start     Ordered   09/27/17 1541  DME Bedside commode  Once    Question:  Patient needs a bedside commode to treat with the following condition  Answer:  Status post total hip replacement, right   09/27/17 1540   09/27/17 1541  DME 3 n 1  Once     09/27/17 1540   09/27/17 1541  DME Walker rolling  Once    Question:  Patient needs a walker to treat with the following condition  Answer:  Status post total hip replacement, right   09/27/17 1540          Diagnostic Studies: Dg Hip Operative Unilat W Or W/o Pelvis Right  Result Date: 09/27/2017 CLINICAL DATA:  Anterior right hip replacement EXAM: OPERATIVE right HIP (WITH PELVIS IF PERFORMED) two VIEWS TECHNIQUE: Fluoroscopic spot image(s) were submitted for interpretation post-operatively. COMPARISON:  None. FINDINGS: Two C-arm spot films show a right hip replacement components in good position. No complicating features are seen. The entire femoral stem of the right hip replacement is not visualized. IMPRESSION: Right hip replacement components in good position on images obtained. Electronically Signed   By: Ivar Drape M.D.   On: 09/27/2017 14:14   Dg Hip Unilat W Or W/o Pelvis 2-3 Views Right  Result Date:  09/27/2017 CLINICAL DATA:  Post right hip replacement  EXAM: DG HIP (WITH OR WITHOUT PELVIS) 2-3V RIGHT COMPARISON:  None. FINDINGS: The femoral and acetabular components of the right hip replacement appear to be in good position. However on the frontal view fracture of the greater trochanter cannot be excluded. Right pelvic ramus appears intact. IMPRESSION: 1. Components of right total hip replacement in good position on images obtained. 2. Cannot exclude fracture of the right femoral greater trochanter on one view. Electronically Signed   By: Ivar Drape M.D.   On: 09/27/2017 14:46    Disposition:      Contact information for follow-up providers    Duanne Guess, PA-C Follow up in 2 week(s).   Specialties:  Orthopedic Surgery, Emergency Medicine Contact information: [place] [phone] [phone]            Contact information for after-discharge care    Destination    [facility] Preferred SNF .   Service:  Skilled Nursing Contact information: [address] [place] [phone]                   Signed: Feliberto Gottron 09/30/2017, 8:12 AM

## 2017-09-29 NOTE — Progress Notes (Signed)
Per PT he has talked with patient and now she is considering SNF. Clinical Social Worker (CSW) met with patient to discuss D/C plan. Patient reported that she still wants to go home and will consider SNF. FL2 complete and faxed out.  CSW presented bed offers and patient chose [facility] however [facility] requires $2,100 up front for 2 weeks of co-pays. Patient reported that she can pay the co-pays but does not want to pay that much up front. Patient then chose [facility]. Per Otila Kluver [facility] liaison they will work with patient on co-pays and only require 5 days up front. Patient verbalized her understanding and chose [facility].   Per Otila Kluver she will start [facility] authorization today. CSW will continue to follow and assist as needed.   [employer], LCSW [phone]

## 2017-09-29 NOTE — Discharge Instructions (Signed)
ANTERIOR APPROACH TOTAL HIP REPLACEMENT POSTOPERATIVE DIRECTIONS   Hip Rehabilitation, Guidelines Following Surgery  The results of a hip operation are greatly improved after range of motion and muscle strengthening exercises. Follow all safety measures which are given to protect your hip. If any of these exercises cause increased pain or swelling in your joint, decrease the amount until you are comfortable again. Then slowly increase the exercises. Call your caregiver if you have problems or questions.   HOME CARE INSTRUCTIONS  Remove items at home which could result in a fall. This includes throw rugs or furniture in walking pathways.   ICE to the affected hip every three hours for 30 minutes at a time and then as needed for pain and swelling.  Continue to use ice on the hip for pain and swelling from surgery. You may notice swelling that will progress down to the foot and ankle.  This is normal after surgery.  Elevate the leg when you are not up walking on it.    Continue to use the breathing machine which will help keep your temperature down.  It is common for your temperature to cycle up and down following surgery, especially at night when you are not up moving around and exerting yourself.  The breathing machine keeps your lungs expanded and your temperature down.  Do not place pillow under knee, focus on keeping the knee straight while resting  DIET You may resume your previous home diet once your are discharged from the hospital.  DRESSING / WOUND CARE / SHOWERING Please remove provena negative pressure dressing on 10/07/2017 and apply honey comb dressing. Keep dressing clean and dry at all times. .  ACTIVITY Walk with your walker as instructed. Use walker as long as suggested by your caregivers. Avoid periods of inactivity such as sitting longer than an hour when not asleep. This helps prevent blood clots.  You may resume a sexual relationship in one month or when given the OK by  your doctor.  You may return to work once you are cleared by your doctor.  Do not drive a car for 6 weeks or until released by you surgeon.  Do not drive while taking narcotics.  WEIGHT BEARING Weight bearing as tolerated. Use walker/cane as needed for at least 4 weeks post op.  POSTOPERATIVE CONSTIPATION PROTOCOL Constipation - defined medically as fewer than three stools per week and severe constipation as less than one stool per week.  One of the most common issues patients have following surgery is constipation.  Even if you have a regular bowel pattern at home, your normal regimen is likely to be disrupted due to multiple reasons following surgery.  Combination of anesthesia, postoperative narcotics, change in appetite and fluid intake all can affect your bowels.  In order to avoid complications following surgery, here are some recommendations in order to help you during your recovery period.  Colace (docusate) - Pick up an over-the-counter form of Colace or another stool softener and take twice a day as long as you are requiring postoperative pain medications.  Take with a full glass of water daily.  If you experience loose stools or diarrhea, hold the colace until you stool forms back up.  If your symptoms do not get better within 1 week or if they get worse, check with your doctor.  Dulcolax (bisacodyl) - Pick up over-the-counter and take as directed by the product packaging as needed to assist with the movement of your bowels.  Take with a  full glass of water.  Use this product as needed if not relieved by Colace only.  ° °MiraLax (polyethylene glycol) - Pick up over-the-counter to have on hand.  MiraLax is a solution that will increase the amount of water in your bowels to assist with bowel movements.  Take as directed and can mix with a glass of water, juice, soda, coffee, or tea.  Take if you go more than two days without a movement. °Do not use MiraLax more than once per day. Call your  doctor if you are still constipated or irregular after using this medication for 7 days in a row. ° °If you continue to have problems with postoperative constipation, please contact the office for further assistance and recommendations.  If you experience "the worst abdominal pain ever" or develop nausea or vomiting, please contact the office immediatly for further recommendations for treatment. ° °ITCHING ° If you experience itching with your medications, try taking only a single pain pill, or even half a pain pill at a time.  You can also use Benadryl over the counter for itching or also to help with sleep.  ° °TED HOSE STOCKINGS °Wear the elastic stockings on both legs for six weeks following surgery during the day but you may remove then at night for sleeping. ° °MEDICATIONS °See your medication summary on the “After Visit Summary” that the nursing staff will review with you prior to discharge.  You may have some home medications which will be placed on hold until you complete the course of blood thinner medication.  It is important for you to complete the blood thinner medication as prescribed by your surgeon.  Continue your approved medications as instructed at time of discharge. ° °PRECAUTIONS °If you experience chest pain or shortness of breath - call 911 immediately for transfer to the hospital emergency department.  °If you develop a fever greater that 101 F, purulent drainage from wound, increased redness or drainage from wound, foul odor from the wound/dressing, or calf pain - CONTACT YOUR SURGEON.   °                                                °FOLLOW-UP APPOINTMENTS °Make sure you keep all of your appointments after your operation with your surgeon and caregivers. You should call the office at the above phone number and make an appointment for approximately two weeks after the date of your surgery or on the date instructed by your surgeon outlined in the "After Visit Summary". ° °RANGE OF MOTION  AND STRENGTHENING EXERCISES  °These exercises are designed to help you keep full movement of your hip joint. Follow your caregiver's or physical therapist's instructions. Perform all exercises about fifteen times, three times per day or as directed. Exercise both hips, even if you have had only one joint replacement. These exercises can be done on a training (exercise) mat, on the floor, on a table or on a bed. Use whatever works the best and is most comfortable for you. Use music or television while you are exercising so that the exercises are a pleasant break in your day. This will make your life better with the exercises acting as a break in routine you can look forward to.  °Lying on your back, slowly slide your foot toward your buttocks, raising your knee up off the floor. Then   slowly slide your foot back down until your leg is straight again.  °Lying on your back spread your legs as far apart as you can without causing discomfort.  °Lying on your side, raise your upper leg and foot straight up from the floor as far as is comfortable. Slowly lower the leg and repeat.  °Lying on your back, tighten up the muscle in the front of your thigh (quadriceps muscles). You can do this by keeping your leg straight and trying to raise your heel off the floor. This helps strengthen the largest muscle supporting your knee.  °Lying on your back, tighten up the muscles of your buttocks both with the legs straight and with the knee bent at a comfortable angle while keeping your heel on the floor.  ° °IF YOU ARE TRANSFERRED TO A SKILLED REHAB FACILITY °If the patient is transferred to a skilled rehab facility following release from the hospital, a list of the current medications will be sent to the facility for the patient to continue.  When discharged from the skilled rehab facility, please have the facility set up the patient's Home Health Physical Therapy prior to being released. Also, the skilled facility will be responsible  for providing the patient with their medications at time of release from the facility to include their pain medication, the muscle relaxants, and their blood thinner medication. If the patient is still at the rehab facility at time of the two week follow up appointment, the skilled rehab facility will also need to assist the patient in arranging follow up appointment in our office and any transportation needs. ° °MAKE SURE YOU:  °Understand these instructions.  °Get help right away if you are not doing well or get worse.  ° ° °Pick up stool softner and laxative for home use following surgery while on pain medications. °Continue to use ice for pain and swelling after surgery. °Do not use any lotions or creams on the incision until instructed by your surgeon. ° °

## 2017-09-29 NOTE — Care Management Note (Signed)
Case Management Note  Patient Details  Name: Bethany Scott MRN: [id number] Date of Birth: 1955/04/19  Subjective/Objective:                  RNCM spoke with patient regarding transition of care. She plans to return to home with her husband at discharge. She has borrowed a walker and bedside commode from her sister. She plans to have home PT and requests [facility].  She will be on aspirin at discharge per progress note.   Action/Plan: Home health agency choice provided to patient. Referral to [facility].  Expected Discharge Date:  09/29/17               Expected Discharge Plan:     In-House Referral:     Discharge planning Services  CM Consult  Post Acute Care Choice:  Home Health Choice offered to:  Patient  DME Arranged:    DME Agency:     HH Arranged:  PT [place]:  [facility] (formerly [facility])  Status of Service:  In process, will continue to follow  If discussed at Long Length of Stay Meetings, dates discussed:    Additional Comments:  Marshell Garfinkel, RN 09/29/2017, 8:12 AM

## 2017-09-29 NOTE — Progress Notes (Signed)
Occupational Therapy Treatment Patient Details Name: Bethany Scott MRN: [id number] DOB: Jun 01, 1955 Today's Date: 09/29/2017    History of present illness Pt is a 62 yo F who presents with OA of the R hip and is now s/p elective R THA.  PHM includes HTN, PUD, DM, and HLD.     OT comments  Pt seen for OT tx this date. Pt up in recliner, agreeable to OT session, reporting mild improvement in overall pain control, but still limiting her from meeting therapy goals in order to return home. Pt expressed frustration and feeling "disappointed in myself" because she has not progressed as anticipated 2/2 pain. OT provided emotional support. Pt educated in AE/DME for LB ADL tasks, home/routines modifications, negotiating bathroom and kitchen environments with a RW, and falls prevention. Pt able to transfer from recliner to [facility] with CGA and improved technique per pt report with initial instruction in scooting to the edge of the chair, getting her feet underneath her, and leaning forward to bring her COG over top her BOS. Pt appreciative for the instruction. Verbalizes plan to go to rehab for a brief period of time before returning home more safely. Pt plans to use sister's RW, BSC, and reacher. Pt progressing towards goals, slowly, continues to be somewhat pain limited. STR remains appropriate. Will continue to progress.   Follow Up Recommendations  SNF    Equipment Recommendations  None recommended by OT(sister has BSC, RW, and reacher pt can borrow)    Recommendations for Other Services      Precautions / Restrictions Precautions Precautions: Anterior Hip Precaution Booklet Issued: Yes (comment) Restrictions Weight Bearing Restrictions: Yes RLE Weight Bearing: Weight bearing as tolerated       Mobility Bed Mobility               General bed mobility comments: deferred, up in recliner  Transfers Overall transfer level: Needs assistance Equipment used: Rolling walker (2  wheeled) Transfers: Sit to/from Stand Sit to Stand: Min guard         General transfer comment: Extra time and effort with transfers but no physical assistance required, cues for hand/foot placement to improve t/f technique with endorsing improved comfort and ease of transfer when scooting to the edge of the chair and getting feet underneath her as much as possible before attempting then bending forward    Balance Overall balance assessment: Mild deficits observed, not formally tested                                         ADL either performed or assessed with clinical judgement   ADL Overall ADL's : Needs assistance/impaired                         Toilet Transfer: BSC;RW;Min guard;Ambulation Toilet Transfer Details (indicate cue type and reason): significantly increased time to perform 2/2 pain, CGA and verbal cues for hand/foot placement to optimize ease of transfer, ambulated approx 7 feet to/from recliner Toileting- Clothing Manipulation and Hygiene: Sitting/lateral lean;Supervision/safety       Functional mobility during ADLs: Min guard;Rolling walker       Vision Baseline Vision/History: Wears glasses Wears Glasses: Distance only Patient Visual Report: No change from baseline     Perception     Praxis      Cognition Arousal/Alertness: Awake/alert Behavior During Therapy: WFL for tasks assessed/performed  Overall Cognitive Status: Within Functional Limits for tasks assessed                                          Exercises Other Exercises Other Exercises: Pt educated in AE/DME and home/routines modifications to maximize safety and independence with ADL and negotiating her home environment Other Exercises: Pt educated in RW use and positioning within bathroom and kitchen environments   Shoulder Instructions       General Comments      Pertinent Vitals/ Pain       Pain Assessment: 0-10 Pain Score: 4  Pain  Location: R hip Pain Descriptors / Indicators: Aching;Sore Pain Intervention(s): Limited activity within patient's tolerance;Monitored during session;Premedicated before session;Repositioned;Ice applied  Home Living                                          Prior Functioning/Environment              Frequency  Min 1X/week        Progress Toward Goals  OT Goals(current goals can now be found in the care plan section)  Progress towards OT goals: Progressing toward goals  Acute Rehab OT Goals Patient Stated Goal: have less pain and get back to PLOF OT Goal Formulation: With patient Time For Goal Achievement: 10/12/17 Potential to Achieve Goals: Good  Plan Discharge plan remains appropriate;Frequency remains appropriate    Co-evaluation                 AM-PAC PT "6 Clicks" Daily Activity     Outcome Measure   Help from another person eating meals?: None Help from another person taking care of personal grooming?: None Help from another person toileting, which includes using toliet, bedpan, or urinal?: A Little Help from another person bathing (including washing, rinsing, drying)?: A Lot Help from another person to put on and taking off regular upper body clothing?: None Help from another person to put on and taking off regular lower body clothing?: A Lot 6 Click Score: 19    End of Session Equipment Utilized During Treatment: Rolling walker;Gait belt  OT Visit Diagnosis: Other abnormalities of gait and mobility (R26.89);Pain Pain - Right/Left: Right Pain - part of body: Hip   Activity Tolerance Patient tolerated treatment well   Patient Left in chair;with call bell/phone within reach;with chair alarm set;with family/visitor present;with SCD's reapplied;Other (comment)(ice packs on R hip)   Nurse Communication          Time: 2248-2500 OT Time Calculation (min): 49 min  Charges: OT General Charges $OT Visit: 1 Visit OT  Treatments $Self Care/Home Management : 38-52 mins  Jeni Salles, MPH, MS, OTR/L ascom [phone] 09/29/17, 3:59 PM

## 2017-09-29 NOTE — Progress Notes (Signed)
Physical Therapy Treatment Patient Details Name: Bethany Scott MRN: [id number] DOB: 02-05-1955 Today's Date: 09/29/2017    History of Present Illness Pt is a 62 yo F who presents with OA of the R hip and is now s/p elective R THA.  PHM includes HTN, PUD, DM, and HLD.      PT Comments    Pt presents with deficits in strength, transfers, mobility, gait, and activity tolerance.  Pt continues to ambulate with extremely slow, guarded cadence with increased difficulty advancing the RLE during ambulation this session.  Pt performed exaggerated LLE heel raises prior to advancing the RLE to compensate for difficulty clearing the floor with her RLE.  Pt could only amb 8 feet this session with a RW and CGA before requiring to return to sitting.  Pt will benefit from PT services in a SNF setting upon discharge to safely address above deficits for decreased caregiver assistance and eventual return to PLOF.      Follow Up Recommendations  SNF     Equipment Recommendations  None recommended by PT    Recommendations for Other Services       Precautions / Restrictions Precautions Precautions: Anterior Hip Precaution Booklet Issued: Yes (comment) Restrictions Weight Bearing Restrictions: Yes RLE Weight Bearing: Weight bearing as tolerated    Mobility  Bed Mobility               General bed mobility comments: deferred, up in recliner  Transfers Overall transfer level: Needs assistance Equipment used: Rolling walker (2 wheeled) Transfers: Sit to/from Stand Sit to Stand: Min guard         General transfer comment: Extra time and effort with transfers but no physical assistance required  Ambulation/Gait Ambulation/Gait assistance: Min guard Gait Distance (Feet): 8 Feet Assistive device: Rolling walker (2 wheeled) Gait Pattern/deviations: Step-to pattern;Decreased stance time - right;Decreased step length - left;Antalgic Gait velocity: Decreased   General Gait Details: Very slow  cadence during amb with difficulty clearing the R foot from the floor with pt compensating by doing a LLE heel raise prior to advancing the RLE   Stairs             Wheelchair Mobility    Modified Rankin (Stroke Patients Only)       Balance Overall balance assessment: No apparent balance deficits (not formally assessed)                                          Cognition Arousal/Alertness: Awake/alert Behavior During Therapy: WFL for tasks assessed/performed Overall Cognitive Status: Within Functional Limits for tasks assessed                                        Exercises Total Joint Exercises Ankle Circles/Pumps: AROM;Both;10 reps;15 reps Quad Sets: Strengthening;Both;10 reps Gluteal Sets: Strengthening;Both;10 reps Towel Squeeze: Strengthening;Both;10 reps Hip ABduction/ADduction: AROM;Left;10 reps Straight Leg Raises: AROM;Left;10 reps Long Arc Quad: AROM;10 reps;15 reps;Right Knee Flexion: AROM;10 reps;15 reps;Right Marching in Standing: AROM;Both;Standing;10 reps;15 reps Other Exercises Other Exercises: HEP education and review per handout    General Comments        Pertinent Vitals/Pain Pain Assessment: 0-10 Pain Score: 3  Pain Location: R hip Pain Descriptors / Indicators: Aching;Sore Pain Intervention(s): Premedicated before session;Monitored during session    Home Living  Prior Function            PT Goals (current goals can now be found in the care plan section) Progress towards PT goals: Progressing toward goals    Frequency    BID      PT Plan Current plan remains appropriate    Co-evaluation              AM-PAC PT "6 Clicks" Daily Activity  Outcome Measure                   End of Session Equipment Utilized During Treatment: Gait belt Activity Tolerance: Patient limited by pain Patient left: in chair;with chair alarm set;with call bell/phone  within reach;with SCD's reapplied Nurse Communication: Mobility status PT Visit Diagnosis: Muscle weakness (generalized) (M62.81);Other abnormalities of gait and mobility (R26.89);Pain Pain - Right/Left: Right Pain - part of body: Hip     Time: 1219-7588 PT Time Calculation (min) (ACUTE ONLY): 30 min  Charges:  $Gait Training: 8-22 mins $Therapeutic Exercise: 8-22 mins                     D. Scott Alexandera Kuntzman PT, DPT 09/29/17, 2:50 PM

## 2017-09-29 NOTE — Clinical Social Work Placement (Signed)
   CLINICAL SOCIAL WORK PLACEMENT  NOTE  Date:  09/29/2017  Patient Details  Name: THEIA DEZEEUW MRN: [id number] Date of Birth: 05-22-1955  Clinical Social Work is seeking post-discharge placement for this patient at the [place] level of care (*CSW will initial, date and re-position this form in  chart as items are completed):  Yes   Patient/family provided with [place] Work Department's list of facilities offering this level of care within the geographic area requested by the patient (or if unable, by the patient's family).  Yes   Patient/family informed of their freedom to choose among providers that offer the needed level of care, that participate in Medicare, Medicaid or managed care program needed by the patient, have an available bed and are willing to accept the patient.  Yes   Patient/family informed of [facility]'s ownership interest in [facility] and [facility], as well as of the fact that they are under no obligation to receive care at these facilities.  PASRR submitted to EDS on 09/27/17     PASRR number received on 09/29/17     Existing PASRR number confirmed on       FL2 transmitted to all facilities in geographic area requested by pt/family on 09/29/17     FL2 transmitted to all facilities within larger geographic area on       Patient informed that his/her managed care company has contracts with or will negotiate with certain facilities, including the following:        Yes   Patient/family informed of bed offers received.  Patient chooses bed at ([facility] )     Physician recommends and patient chooses bed at      Patient to be transferred to   on  .  Patient to be transferred to facility by       Patient family notified on   of transfer.  Name of family member notified:        PHYSICIAN       Additional Comment:    _______________________________________________ Katiya Fike, Veronia Beets, LCSW 09/29/2017, 2:19 PM

## 2017-09-30 ENCOUNTER — Encounter: Payer: Self-pay | Admitting: Orthopedic Surgery

## 2017-09-30 LAB — SURGICAL PATHOLOGY

## 2017-09-30 MED ORDER — GABAPENTIN 300 MG PO CAPS: 300.0000 mg | ORAL_CAPSULE | Freq: Three times a day (TID) | ORAL | 0 refills | Status: DC

## 2017-09-30 MED ORDER — TRAMADOL HCL 50 MG PO TABS: 50.0000 mg | ORAL_TABLET | Freq: Four times a day (QID) | ORAL | 0 refills | Status: DC

## 2017-09-30 NOTE — Progress Notes (Signed)
Physical Therapy Treatment Patient Details Name: Bethany Scott MRN: [id number] DOB: [birth year] Today's Date: 09/30/2017    History of Present Illness Pt is a 62 yo F who presents with OA of the R hip and is now s/p elective R THA.  PHM includes HTN, PUD, DM, and HLD.      PT Comments    Pt presents with deficits in strength, transfers, mobility, gait, and activity tolerance.  Pt continues to require cues and extra time and effort during sit to/from stand transfers.  Pt was able to amb 10' with a RW and CGA and continues to amb with extremely slow, effortful cadence with poor clearance while advancing the RLE.  Pt was able to ascend/descend 4 steps with R rail, CGA, and +2 for safety.  Pt required Mod verbal and tactile cues for sequencing with occasional min A to help position RLE on the step correctly for safety.  Pt was overall steady ascending and descending the stairs but with extremely slow, effortful cadence.  Continue to recommend SNF placement at discharge for pt to address the above deficits for a safe eventual return to her prior living situation.      Follow Up Recommendations  SNF     Equipment Recommendations  None recommended by PT    Recommendations for Other Services       Precautions / Restrictions Precautions Precautions: Anterior Hip Precaution Booklet Issued: Yes (comment) Restrictions Weight Bearing Restrictions: Yes RLE Weight Bearing: Weight bearing as tolerated    Mobility  Bed Mobility               General bed mobility comments: deferred, up in recliner  Transfers Overall transfer level: Needs assistance Equipment used: Rolling walker (2 wheeled) Transfers: Sit to/from Stand Sit to Stand: Supervision         General transfer comment: Extra time and effort with transfers but no physical assistance required; cues for hand/foot placement to improve technique and to promote increased use of the RLE during  transfers  Ambulation/Gait Ambulation/Gait assistance: Min guard Gait Distance (Feet): 10 Feet Assistive device: Rolling walker (2 wheeled) Gait Pattern/deviations: Step-to pattern;Decreased stance time - right;Decreased step length - left;Antalgic Gait velocity: Decreased   General Gait Details: Very slow cadence during amb with difficulty clearing the R foot from the floor with pt compensating by doing a LLE heel raise prior to advancing the RLE   Stairs Stairs: Yes Stairs assistance: +2 safety/equipment;Min assist Stair Management: One rail Right Number of Stairs: 4 General stair comments: Mod verbal and tactile cues for sequencing with occasional min A to help position RLE on the step for safety.  Pt overall steady but with extremely slow and cautious cadence.    Wheelchair Mobility    Modified Rankin (Stroke Patients Only)       Balance Overall balance assessment: No apparent balance deficits (not formally assessed)                                          Cognition Arousal/Alertness: Awake/alert Behavior During Therapy: WFL for tasks assessed/performed Overall Cognitive Status: Within Functional Limits for tasks assessed                                        Exercises Total Joint Exercises Long Arc  Quad: AROM;10 reps;15 reps;Right Knee Flexion: AROM;10 reps;15 reps;Right Marching in Standing: Both;Standing;10 reps;AROM Other Exercises Other Exercises: Verbal and visual stair training education provided    General Comments        Pertinent Vitals/Pain Pain Assessment: 0-10 Pain Score: 4  Pain Location: R hip Pain Descriptors / Indicators: Aching;Sore Pain Intervention(s): Premedicated before session;Monitored during session    Home Living                      Prior Function            PT Goals (current goals can now be found in the care plan section) Progress towards PT goals: Progressing toward goals     Frequency    BID      PT Plan Current plan remains appropriate    Co-evaluation              AM-PAC PT "6 Clicks" Daily Activity  Outcome Measure                   End of Session Equipment Utilized During Treatment: Gait belt Activity Tolerance: Patient limited by pain Patient left: Other (comment)(Pt left in BR on toilet with CNA advised and pt educated to pull cord for assistance when ready) Nurse Communication: Mobility status PT Visit Diagnosis: Muscle weakness (generalized) (M62.81);Other abnormalities of gait and mobility (R26.89);Pain Pain - Right/Left: Right Pain - part of body: Hip     Time: 1030-1108 PT Time Calculation (min) (ACUTE ONLY): 38 min  Charges:  $Gait Training: 23-37 mins $Therapeutic Exercise: 8-22 mins                     D. Scott Kahmari Koller PT, DPT 09/30/17, 11:40 AM

## 2017-09-30 NOTE — Progress Notes (Signed)
   Subjective: 3 Days Post-Op Procedure(s) (LRB): TOTAL HIP ARTHROPLASTY ANTERIOR APPROACH (Right) Patient reports pain as mild.  Feeling better today than yesterday.  Oxycodone improving pain. Patient is well, and has had no acute complaints or problems Denies any CP, SOB, ABD pain. We will continue therapy today.  Plan is to go Home after hospital stay.  Objective: Vital signs in last 24 hours: Temp:  [98 F (36.7 C)-99.3 F (37.4 C)] 99.3 F (37.4 C) (09/05 2330) Pulse Rate:  [79-93] 80 (09/05 2330) Resp:  [17] 17 (09/05 2330) BP: (108-110)/(51-61) 108/51 (09/05 2330) SpO2:  [95 %-100 %] 100 % (09/05 2330)  Intake/Output from previous day: 09/05 0701 - 09/06 0700 In: 240 [P.O.:240] Out: -  Intake/Output this shift: No intake/output data recorded.  Recent Labs    09/28/17 0328 09/29/17 0318  HGB 11.9* 10.5*   Recent Labs    09/28/17 0328 09/29/17 0318  WBC 7.4 7.4  RBC 3.63* 3.24*  HCT 33.9* 30.4*  PLT 224 188   Recent Labs    09/28/17 0328 09/29/17 0318  NA 138 141  K 4.5 4.0  CL 104 109  CO2 29 27  BUN 13 8  CREATININE 0.49 0.40*  GLUCOSE 134* 124*  CALCIUM 8.5* 8.3*   No results for input(s): LABPT, INR in the last 72 hours.  EXAM General - Patient is Alert, Appropriate and Oriented Extremity - Neurovascular intact Sensation intact distally Intact pulses distally Dorsiflexion/Plantar flexion intact No cellulitis present Compartment soft Dressing - dressing C/D/I and no drainage, wound vac intact Motor Function - intact, moving foot and toes well on exam.   Past Medical History:  Diagnosis Date  . Allergy   . Anemia   . Anxiety   . Arthritis    OA Knee; non-specific autoimmune process followed by [facility]/Rheumatology.  . Asthma   . Blood transfusion without reported diagnosis YRS AGO  . Depression   . Diabetes mellitus without complication ([place])   . Gastric ulcer, unspecified as acute or chronic, without mention of hemorrhage,  perforation, or obstruction YRS AGO  . GERD (gastroesophageal reflux disease)   . Hyperlipidemia   . Hypertension   . Migraine    hx of migraines  . Obesity, unspecified   . Other chronic cystitis   . Personal history of colonic polyps   . PONV (postoperative nausea and vomiting)    NAUSEA, SCOPOLAMINE PATCH HELPED WITH LAST SURGERIES  . Pre-diabetes     Assessment/Plan:   3 Days Post-Op Procedure(s) (LRB): TOTAL HIP ARTHROPLASTY ANTERIOR APPROACH (Right) Active Problems:   Status post total hip replacement, right  Estimated body mass index is 30.01 kg/m as calculated from the following:   Height as of this encounter: 5\' 7"  (1.702 m).   Weight as of this encounter: 86.9 kg. Advance diet Up with therapy  Needs BM before discharge Vital signs are stable Pain well controlled with oxycodone Plan on discharge to home with home health or skilled nursing facility today pending on progress with PT.  Patient prefers to go home but will need to make progress with physical therapy.  Patient also needs bowel movement for discharge.    DVT Prophylaxis - Aspirin, TED hose and SCDs Weight-Bearing as tolerated to left leg   T. Rachelle Hora, PA-C [place] 09/30/2017, 7:27 AM

## 2017-09-30 NOTE — Care Management (Signed)
Helene Kelp with [facility] notified of patient discharge. No other RNCM needs.

## 2017-09-30 NOTE — Progress Notes (Signed)
OT Cancellation Note  Patient Details Name: Bethany Scott MRN: [id number] DOB: 25-Jun-1955   Cancelled Treatment:    Reason Eval/Treat Not Completed: Patient declined, no reason specified. Upon attempt to treat, pt states she recently ambulated to/from bathroom with staff and needed to "regain her strength" before PT is planning to come back shortly. Pt denies any needs at this time. Pt states she is feeling much better today. Agreeable to OT re-attempting later.   Jeni Salles, MPH, MS, OTR/L ascom [phone] 09/30/17, 10:24 AM

## 2017-09-30 NOTE — Progress Notes (Signed)
Prevena small negative pressure therapy device attached and turned on prior to discharge. Dressing clean,dry and intact. Patient information booklet given to patient.

## 2017-09-30 NOTE — Progress Notes (Signed)
Per [facility] liaison [facility] SN[facility] authorization has been received. Clinical Social Worker (CSW) met with patient and made her aware of above. Per patient she does not want to go to SNF and wants to go home. Patient understands that she will not be able to go to [facility] from home because [employer] will not approve SNF once she goes home. RN case manager aware of above. Please reconsult if future social work needs arise. CSW signing off.   [employer], LCSW [phone]

## 2017-10-03 ENCOUNTER — Encounter: Payer: Self-pay | Admitting: *Deleted

## 2017-10-03 ENCOUNTER — Other Ambulatory Visit: Payer: Self-pay | Admitting: *Deleted

## 2017-10-03 NOTE — Patient Outreach (Signed)
[facility]) Care Management  10/03/2017  Bethany Scott 1955/02/24 [id number]   Subjective: Telephone call to patient's home / mobile number, spoke with patient, and HIPAA verified.  Discussed [facility] Care Management UMR Transition of care follow up, patient voiced understanding, and is in agreement to follow up.  Patient states she is feeling okay, surgery went well, very sore, pain being managed by medication, planning to call surgeon's office today regarding request for pain med refills,  and has a follow up appointment with surgeon on 10/12/17.   Patient states she is able to manage self care and has assistance as needed with activities of daily living / home management.  States home health physical therapy will start on 10/04/17 with [facility].  Patient voices understanding of medical diagnosis, surgery, and treatment plan.  States she no longer has active diabetes or high cholesterol since her gastric bypass surgery.   States she is accessing the following [facility] benefits: outpatient pharmacy, hospital indemnity (not sure if chosen, verbally given contact number for [employer] [phone], will call to verify benefit,  will file claim if appropriate, verbally given contact number for [facility] Patient Accounting [phone] to request itemized bill), and family medical leave act (FMLA) in place.   Patient states she does not have any education material, transition of care, care coordination, disease management, disease monitoring, transportation, community resource, or pharmacy needs at this time.  States she is very appreciative of the follow up and is in agreement to receive [place] Management information.     Objective: Per KPN (Knowledge Performance Now, point of care tool) and chart review, patient hospitalized 09/27/17 -09/30/17 for  PRIMARY LOCALIZED OSTEOARTHRITIS OF RIGHT HIP, status post TOTAL HIP ARTHROPLASTY ANTERIOR APPROACH (Right) on 09/27/17.   Patient hospitalized 03/01/16 -  03/03/16 for obesity. Status post LAPAROSCOPIC SLEEVE GASTRECTOMY UPPER GI ENDOSCOPY on 03/01/16. Patient also has a history of diabetes and hypertension. [facility] Care Management preop call completed on 02/25/16, transition of care follow up not completed due to patient unable to contact, and case closed on 03/23/17.     Assessment: Received UMR Transition of care referral on 09/27/17.    Transition of care follow up completed, no care management needs, and will proceed with case closure.       Plan: RNCM will send patient successful outreach letter, [facility] pamphlet, and magnet. RNCM will complete case closure due to follow up completed / no care management needs.       Enslee Bibbins H. Annia Friendly, BSN, [place] Management [facility] Telephonic CM Phone: [phone] Fax: [phone]

## 2017-10-04 DIAGNOSIS — M169 Osteoarthritis of hip, unspecified: Secondary | ICD-10-CM | POA: Diagnosis not present

## 2017-10-06 ENCOUNTER — Other Ambulatory Visit: Payer: Self-pay | Admitting: *Deleted

## 2017-10-06 NOTE — Patient Outreach (Addendum)
[facility]) Care Management  10/06/2017  Bethany Scott 02/05/1955 [id number]   Subjective: Telephone call to patient's home  / mobile number, no answer, left HIPAA compliant voicemail message, and requested call back.    Objective:  Per KPN (Knowledge Performance Now, point of care tool) and chart review, patient hospitalized 09/27/17 -09/30/17 for  PRIMARY LOCALIZED OSTEOARTHRITIS OF RIGHT HIP, status post TOTAL HIP ARTHROPLASTY ANTERIOR APPROACH (Right) on 09/27/17.   Patient hospitalized 03/01/16 - 03/03/16 for obesity. Status post LAPAROSCOPIC SLEEVE GASTRECTOMY UPPER GI ENDOSCOPY on 03/01/16. Patient also has a history of diabetes and hypertension. [facility] Care Management last transition of care follow up completed on 10/03/17.    Assessment:Received [facility] EMMI General Discharge Red Flag alert / THN Consult on 10/06/17.   Patient answered yes to the following question: Sad/ hopeless/ anxious/ empty.    [facility] Consult follow up pending patient contact.      Plan: RNCM will send unsuccessful outreach  letter, [facility] pamphlet, will call patient for 2nd telephone outreach attempt, EMMI  follow up, and proceed with case closure, within 10 business days if no return call.       Retaj Hilbun H. Annia Friendly, BSN, [place] Management [facility] Telephonic CM Phone: [phone] Fax: [phone]

## 2017-10-07 ENCOUNTER — Other Ambulatory Visit: Payer: Self-pay | Admitting: *Deleted

## 2017-10-07 NOTE — Patient Outreach (Addendum)
[facility]) Care Management  10/07/2017  Bethany Scott [birth year] [id number]   Subjective: Telephone call to patient's home / mobile number, spoke with patient, and HIPAA verified.  Discussed [facility] Care Management UMR EMMI General Discharge follow up, patient voiced understanding, and is in agreement to follow up.  Patient states she remembers speaking with this RNCM earlier this week.   States when she received the [facility] General Discharge call, she was feeling a little sad, overwhelmed, feeling much better today, has utilized the [employer] Program (EACP) in the past, and will use it as needed.  States she is aware of how to access EACP, does not feel that she needs follow up at this time with EACP or her primary MD's office, and will access both if needed in the future.  States she was having a bad day when she received the EMMI follow up call and things are better at this time .  Patient states she does not have any education material, transition of care, care coordination, disease management, disease monitoring, transportation, community resource, or pharmacy needs at this time. States she is very appreciative of the follow up and is in agreement to receive [place] Management information.    Objective:  Per KPN (Knowledge Performance Now, point of care tool) and chart review,patient hospitalized 09/27/17 -09/30/17 forPRIMARY LOCALIZED OSTEOARTHRITIS OF RIGHT HIP, status postTOTAL HIP ARTHROPLASTY ANTERIOR APPROACH (Right)on 09/27/17. Patient hospitalized 03/01/16 - 03/03/16 for obesity. Status post LAPAROSCOPIC SLEEVE GASTRECTOMY UPPER GI ENDOSCOPY on 03/01/16. Patient also has a history of diabetes and hypertension. [facility] Care Management last transition of care follow up completed on 10/03/17.    Assessment:Received [facility] EMMI General Discharge Red Flag alert / THN Consult on 10/06/17.   Patient answered yes to the following question: Sad/ hopeless/  anxious/ empty.    THN Consult follow up completed, no care management needs, and will proceed with case closure.      Plan: RNCM will send patient successful outreach letter, [facility] pamphlet, and magnet. RNCM will complete case closure due to follow up completed / no care management needs.       Dawson Albers H. Annia Friendly, BSN, [place] Management [facility] Telephonic CM Phone: [phone] Fax: [phone]

## 2017-10-08 DIAGNOSIS — M169 Osteoarthritis of hip, unspecified: Secondary | ICD-10-CM | POA: Diagnosis not present

## 2017-10-10 DIAGNOSIS — M169 Osteoarthritis of hip, unspecified: Secondary | ICD-10-CM | POA: Diagnosis not present

## 2017-10-11 ENCOUNTER — Ambulatory Visit (INDEPENDENT_AMBULATORY_CARE_PROVIDER_SITE_OTHER): Payer: 59 | Admitting: Vascular Surgery

## 2017-10-13 DIAGNOSIS — M169 Osteoarthritis of hip, unspecified: Secondary | ICD-10-CM | POA: Diagnosis not present

## 2017-10-17 DIAGNOSIS — I872 Venous insufficiency (chronic) (peripheral): Secondary | ICD-10-CM | POA: Diagnosis not present

## 2017-10-17 DIAGNOSIS — J45909 Unspecified asthma, uncomplicated: Secondary | ICD-10-CM | POA: Diagnosis not present

## 2017-10-17 DIAGNOSIS — M5386 Other specified dorsopathies, lumbar region: Secondary | ICD-10-CM | POA: Diagnosis not present

## 2017-10-17 DIAGNOSIS — I10 Essential (primary) hypertension: Secondary | ICD-10-CM | POA: Diagnosis not present

## 2017-10-17 DIAGNOSIS — Z471 Aftercare following joint replacement surgery: Secondary | ICD-10-CM | POA: Diagnosis not present

## 2017-10-17 DIAGNOSIS — M1710 Unilateral primary osteoarthritis, unspecified knee: Secondary | ICD-10-CM | POA: Diagnosis not present

## 2017-10-17 DIAGNOSIS — E1151 Type 2 diabetes mellitus with diabetic peripheral angiopathy without gangrene: Secondary | ICD-10-CM | POA: Diagnosis not present

## 2017-10-17 DIAGNOSIS — E1169 Type 2 diabetes mellitus with other specified complication: Secondary | ICD-10-CM | POA: Diagnosis not present

## 2017-10-17 DIAGNOSIS — M47898 Other spondylosis, sacral and sacrococcygeal region: Secondary | ICD-10-CM | POA: Diagnosis not present

## 2017-10-19 DIAGNOSIS — M1710 Unilateral primary osteoarthritis, unspecified knee: Secondary | ICD-10-CM | POA: Diagnosis not present

## 2017-10-19 DIAGNOSIS — M47898 Other spondylosis, sacral and sacrococcygeal region: Secondary | ICD-10-CM | POA: Diagnosis not present

## 2017-10-19 DIAGNOSIS — E1169 Type 2 diabetes mellitus with other specified complication: Secondary | ICD-10-CM | POA: Diagnosis not present

## 2017-10-19 DIAGNOSIS — E1151 Type 2 diabetes mellitus with diabetic peripheral angiopathy without gangrene: Secondary | ICD-10-CM | POA: Diagnosis not present

## 2017-10-19 DIAGNOSIS — M5386 Other specified dorsopathies, lumbar region: Secondary | ICD-10-CM | POA: Diagnosis not present

## 2017-10-19 DIAGNOSIS — I872 Venous insufficiency (chronic) (peripheral): Secondary | ICD-10-CM | POA: Diagnosis not present

## 2017-10-19 DIAGNOSIS — I10 Essential (primary) hypertension: Secondary | ICD-10-CM | POA: Diagnosis not present

## 2017-10-19 DIAGNOSIS — J45909 Unspecified asthma, uncomplicated: Secondary | ICD-10-CM | POA: Diagnosis not present

## 2017-10-19 DIAGNOSIS — Z471 Aftercare following joint replacement surgery: Secondary | ICD-10-CM | POA: Diagnosis not present

## 2017-10-28 ENCOUNTER — Ambulatory Visit (INDEPENDENT_AMBULATORY_CARE_PROVIDER_SITE_OTHER): Payer: 59 | Admitting: Vascular Surgery

## 2017-10-28 ENCOUNTER — Encounter (INDEPENDENT_AMBULATORY_CARE_PROVIDER_SITE_OTHER): Payer: Self-pay | Admitting: Vascular Surgery

## 2017-10-28 VITALS — BP 141/86 | HR 85 | Resp 16

## 2017-10-28 DIAGNOSIS — I83811 Varicose veins of right lower extremities with pain: Secondary | ICD-10-CM | POA: Diagnosis not present

## 2017-10-28 NOTE — Progress Notes (Signed)
Bethany Scott is a 62 y.o.female who presents with painful varicose veins of the right leg  Past Medical History:  Diagnosis Date  . Allergy   . Anemia   . Anxiety   . Arthritis    OA Knee; non-specific autoimmune process followed by [facility]/Rheumatology.  . Asthma   . Blood transfusion without reported diagnosis YRS AGO  . Depression   . Diabetes mellitus without complication ([place])   . Gastric ulcer, unspecified as acute or chronic, without mention of hemorrhage, perforation, or obstruction YRS AGO  . GERD (gastroesophageal reflux disease)   . Hyperlipidemia   . Hypertension   . Migraine    hx of migraines  . Obesity, unspecified   . Other chronic cystitis   . Personal history of colonic polyps   . PONV (postoperative nausea and vomiting)    NAUSEA, SCOPOLAMINE PATCH HELPED WITH LAST SURGERIES  . Pre-diabetes     Past Surgical History:  Procedure Laterality Date  . ABDOMINAL HYSTERECTOMY  01/25/1994   uterine fibroids; DUB; cervical dysplasia; ovaries resected two years later.    . APPENDECTOMY    . BILATERAL OOPHORECTOMY  2001  . CESAREAN SECTION     x 2  . COLON SURGERY  YRS AGO   SIGMOIDECTOMY  . Colonoscopy  09/08/2012   diverticulosis, hemorrhoids.  [name].  Symptoms:  anemia, hemoccult +.  . ESOPHAGOGASTRODUODENOSCOPY  09/08/2012   +HH.  [name].  Symptoms: anemia, hemoccult +.  [name] LAPAROSCOPIC GASTRIC SLEEVE RESECTION N/A 03/01/2016   Procedure: LAPAROSCOPIC GASTRIC SLEEVE RESECTION, UPPER ENDO;  Surgeon: [name], MD;  Location: [facility] ORS;  Service: General;  Laterality: N/A;  . LAPAROSCOPIC LYSIS OF ADHESIONS N/A 02/10/2016   Procedure: LAPAROSCOPIC LYSIS OF ADHESIONS;  Surgeon: [name], MD;  Location: [facility]S;  Service: General;  Laterality: N/A;  . LAPAROSCOPY N/A 02/10/2016   Procedure: LAPAROSCOPY DIAGNOSTIC;  Surgeon: [name], MD;  Location: [facility];  Service: General;  Laterality: N/A;  . NASAL SEPTUM SURGERY    . NECK SURGERY    . Rheumatology  Consult  11/26/2011   multiple arthralgias. Autoimmune labs negative.  Rx for Plaquenil for non-specific autoimmune process.  [name].  [name] SIGMOID RESECTION / RECTOPEXY    . SPINE SURGERY    . TONSILLECTOMY AND ADENOIDECTOMY    . TOTAL HIP ARTHROPLASTY Right 09/27/2017   Procedure: TOTAL HIP ARTHROPLASTY ANTERIOR APPROACH;  Surgeon: [name], MD;  Location: [facility] ORS;  Service: Orthopedics;  Laterality: Right;    Current Outpatient Medications  Medication Sig Dispense Refill  . aspirin EC 325 MG EC tablet Take 1 tablet (325 mg total) by mouth daily with breakfast. 30 tablet 0  . beclomethasone (QVAR) 80 MCG/ACT inhaler Inhale 2 puffs into the lungs daily.     . cetirizine (ZYRTEC) 10 MG tablet Take 10 mg by mouth 2 (two) times daily.     [name] conjugated estrogens (PREMARIN) vaginal cream Place 1 Applicatorful vaginally at bedtime. (Patient taking differently: Place 1 Applicatorful vaginally every Monday. ) 42.5 g 12  . diclofenac (FLECTOR) 1.3 % PTCH Apply one patch or a portion of patch to painful area of skin twice per day if tolerated (Patient taking differently: Place 1 patch onto the skin 2 (two) times daily as needed (pain). Apply one patch or a portion of patch to painful area of skin twice per day if tolerated) 60 patch 0  . diclofenac sodium (VOLTAREN) 1 % GEL Apply 1 application topically daily as needed for  pain.  0  . DULoxetine (CYMBALTA) 60 MG capsule Take 1 capsule (60 mg total) by mouth 2 (two) times daily. 180 capsule 1  . gabapentin (NEURONTIN) 300 MG capsule Take 1 capsule (300 mg total) by mouth 3 (three) times daily. 21 capsule 0  . hydrocortisone (ANUSOL-HC) 25 MG suppository Place 1 suppository (25 mg total) rectally 2 (two) times daily as needed for hemorrhoids. 12 suppository 2  . IRON PO Take 1 tablet by mouth 2 (two) times daily.     . methocarbamol (ROBAXIN) 500 MG tablet Take 1 tablet (500 mg total) by mouth every 6 (six) hours as needed for muscle spasms. 30  tablet 0  . mometasone (NASONEX) 50 MCG/ACT nasal spray Place 2 sprays into the nose daily. (Patient taking differently: Place 1 spray into the nose 2 (two) times daily. ) 17 g 12  . montelukast (SINGULAIR) 10 MG tablet Take 1 tablet (10 mg total) by mouth daily. 90 tablet 3  . Olopatadine HCl 0.2 % SOLN Apply 1 drop to eye daily. 2.5 mL 11  . oxyCODONE (OXY IR/ROXICODONE) 5 MG immediate release tablet Take 1-2 tablets (5-10 mg total) by mouth every 4 (four) hours as needed for moderate pain or severe pain. 40 tablet 0  . pantoprazole (PROTONIX) 40 MG tablet TAKE 1 TABLET BY MOUTH TWO TIMES DAILY 180 tablet 3  . phenazopyridine (PYRIDIUM) 97 MG tablet Take 97-194 mg by mouth daily as needed for pain.    [name] PREVIDENT 5000 DRY MOUTH 1.1 % GEL dental gel Place 1 application onto teeth 2 (two) times daily.  5  . promethazine (PHENERGAN) 25 MG tablet Take 1 tablet (25 mg total) by mouth every 8 (eight) hours as needed for nausea or vomiting. 20 tablet 0  . traMADol (ULTRAM) 50 MG tablet Take 1 tablet (50 mg total) by mouth every 6 (six) hours. 28 tablet 0  . azithromycin (ZITHROMAX Z-PAK) 250 MG tablet Take 1 tablet (250 mg total) by mouth daily. 2 tabs today, then 1 a day x 4 more days (Patient not taking: Reported on 10/03/2017) 6 tablet 0  . doxycycline (VIBRA-TABS) 100 MG tablet Take 1 tablet (100 mg total) by mouth 2 (two) times daily. (Patient not taking: Reported on 10/03/2017) 20 tablet 0  . levofloxacin (LEVAQUIN) 750 MG tablet Take 1 tablet (750 mg total) by mouth daily. (Patient not taking: Reported on 10/03/2017) 10 tablet 0  . nitrofurantoin, macrocrystal-monohydrate, (MACROBID) 100 MG capsule Take 1 capsule (100 mg total) by mouth 2 (two) times daily. (Patient not taking: Reported on 10/03/2017) 10 capsule 0   No current facility-administered medications for this visit.     Allergies  Allergen Reactions  . Cephalexin Anaphylaxis  . Cephalosporins Anaphylaxis  . Penicillins Anaphylaxis    Has  patient had a PCN reaction causing immediate rash, facial/tongue/throat swelling, SOB or lightheadedness with hypotension: Yes Has patient had a PCN reaction causing severe rash involving mucus membranes or skin necrosis: No Has patient had a PCN reaction that required hospitalization Yes Has patient had a PCN reaction occurring within the last 10 years: No If all of the above answers are "NO", then may proceed with Cephalosporin use.     Indication: Patient presents with symptomatic varicose veins of the right lower extremity.  Procedure: Foam sclerotherapy was performed on the right lower extremity. Using ultrasound guidance, 5 mL of foam Sotradecol was used to inject the varicosities of the right lower extremity. Compression wraps were placed. The patient tolerated  the procedure well.

## 2017-10-31 DIAGNOSIS — M222X1 Patellofemoral disorders, right knee: Secondary | ICD-10-CM | POA: Diagnosis not present

## 2017-10-31 DIAGNOSIS — M1711 Unilateral primary osteoarthritis, right knee: Secondary | ICD-10-CM | POA: Diagnosis not present

## 2017-11-08 DIAGNOSIS — M259 Joint disorder, unspecified: Secondary | ICD-10-CM | POA: Diagnosis not present

## 2017-11-08 DIAGNOSIS — Z5181 Encounter for therapeutic drug level monitoring: Secondary | ICD-10-CM | POA: Diagnosis not present

## 2017-11-08 DIAGNOSIS — G5601 Carpal tunnel syndrome, right upper limb: Secondary | ICD-10-CM | POA: Diagnosis not present

## 2017-11-08 DIAGNOSIS — M7060 Trochanteric bursitis, unspecified hip: Secondary | ICD-10-CM | POA: Diagnosis not present

## 2017-11-09 DIAGNOSIS — M1611 Unilateral primary osteoarthritis, right hip: Secondary | ICD-10-CM | POA: Diagnosis not present

## 2017-11-14 DIAGNOSIS — M25561 Pain in right knee: Secondary | ICD-10-CM | POA: Diagnosis not present

## 2017-11-14 DIAGNOSIS — G8929 Other chronic pain: Secondary | ICD-10-CM | POA: Diagnosis not present

## 2017-11-16 DIAGNOSIS — M1711 Unilateral primary osteoarthritis, right knee: Secondary | ICD-10-CM | POA: Diagnosis not present

## 2017-11-18 ENCOUNTER — Other Ambulatory Visit: Payer: Self-pay | Admitting: Family Medicine

## 2017-11-22 DIAGNOSIS — M259 Joint disorder, unspecified: Secondary | ICD-10-CM | POA: Diagnosis not present

## 2017-11-22 DIAGNOSIS — G5601 Carpal tunnel syndrome, right upper limb: Secondary | ICD-10-CM | POA: Diagnosis not present

## 2017-11-22 DIAGNOSIS — Z5181 Encounter for therapeutic drug level monitoring: Secondary | ICD-10-CM | POA: Diagnosis not present

## 2017-11-22 DIAGNOSIS — M7060 Trochanteric bursitis, unspecified hip: Secondary | ICD-10-CM | POA: Diagnosis not present

## 2017-11-25 ENCOUNTER — Ambulatory Visit (INDEPENDENT_AMBULATORY_CARE_PROVIDER_SITE_OTHER): Payer: 59 | Admitting: Vascular Surgery

## 2017-11-29 ENCOUNTER — Encounter (INDEPENDENT_AMBULATORY_CARE_PROVIDER_SITE_OTHER): Payer: Self-pay | Admitting: Vascular Surgery

## 2017-11-29 ENCOUNTER — Ambulatory Visit (INDEPENDENT_AMBULATORY_CARE_PROVIDER_SITE_OTHER): Payer: 59 | Admitting: Vascular Surgery

## 2017-11-29 DIAGNOSIS — I83811 Varicose veins of right lower extremities with pain: Secondary | ICD-10-CM | POA: Diagnosis not present

## 2017-11-29 NOTE — Progress Notes (Signed)
Bethany Scott is a 62 y.o.female who presents with painful varicose veins of the right leg  Past Medical History:  Diagnosis Date  . Allergy   . Anemia   . Anxiety   . Arthritis    OA Knee; non-specific autoimmune process followed by [facility]/Rheumatology.  . Asthma   . Blood transfusion without reported diagnosis YRS AGO  . Depression   . Diabetes mellitus without complication ([place])   . Gastric ulcer, unspecified as acute or chronic, without mention of hemorrhage, perforation, or obstruction YRS AGO  . GERD (gastroesophageal reflux disease)   . Hyperlipidemia   . Hypertension   . Migraine    hx of migraines  . Obesity, unspecified   . Other chronic cystitis   . Personal history of colonic polyps   . PONV (postoperative nausea and vomiting)    NAUSEA, SCOPOLAMINE PATCH HELPED WITH LAST SURGERIES  . Pre-diabetes     Past Surgical History:  Procedure Laterality Date  . ABDOMINAL HYSTERECTOMY  01/25/1994   uterine fibroids; DUB; cervical dysplasia; ovaries resected two years later.    . APPENDECTOMY    . BILATERAL OOPHORECTOMY  2001  . CESAREAN SECTION     x 2  . COLON SURGERY  YRS AGO   SIGMOIDECTOMY  . Colonoscopy  09/08/2012   diverticulosis, hemorrhoids.  [name].  Symptoms:  anemia, hemoccult +.  . ESOPHAGOGASTRODUODENOSCOPY  09/08/2012   +HH.  [name].  Symptoms: anemia, hemoccult +.  [name] LAPAROSCOPIC GASTRIC SLEEVE RESECTION N/A 03/01/2016   Procedure: LAPAROSCOPIC GASTRIC SLEEVE RESECTION, UPPER ENDO;  Surgeon: [name], MD;  [facility];  Service: General;  Laterality: N/A;  . LAPAROSCOPIC LYSIS OF ADHESIONS N/A 02/10/2016   Procedure: LAPAROSCOPIC LYSIS OF ADHESIONS;  Surgeon: [name], MD;  [facility];  Service: General;  Laterality: N/A;  . LAPAROSCOPY N/A 02/10/2016   Procedure: LAPAROSCOPY DIAGNOSTIC;  Surgeon: [name], MD;  [facility];  Service: General;  Laterality: N/A;  . NASAL SEPTUM SURGERY    . NECK SURGERY    . Rheumatology  Consult  11/26/2011   multiple arthralgias. Autoimmune labs negative.  Rx for Plaquenil for non-specific autoimmune process.  [name].  [name] SIGMOID RESECTION / RECTOPEXY    . SPINE SURGERY    . TONSILLECTOMY AND ADENOIDECTOMY    . TOTAL HIP ARTHROPLASTY Right 09/27/2017   Procedure: TOTAL HIP ARTHROPLASTY ANTERIOR APPROACH;  Surgeon: [name], MD;  Location: [facility] ORS;  Service: Orthopedics;  Laterality: Right;    Current Outpatient Medications  Medication Sig Dispense Refill  . beclomethasone (QVAR) 80 MCG/ACT inhaler Inhale 2 puffs into the lungs daily.     . Betamethasone Valerate 0.12 % foam     . cetirizine (ZYRTEC) 10 MG tablet Take 10 mg by mouth 2 (two) times daily.     . clindamycin (CLEOCIN T) 1 % lotion   4  . conjugated estrogens (PREMARIN) vaginal cream Place 1 Applicatorful vaginally at bedtime. (Patient taking differently: Place 1 Applicatorful vaginally every Monday. ) 42.5 g 12  . diclofenac (FLECTOR) 1.3 % PTCH Apply one patch or a portion of patch to painful area of skin twice per day if tolerated (Patient taking differently: Place 1 patch onto the skin 2 (two) times daily as needed (pain). Apply one patch or a portion of patch to painful area of skin twice per day if tolerated) 60 patch 0  . diclofenac sodium (VOLTAREN) 1 % GEL Apply 1 application topically daily as needed for pain.  0  . DULoxetine (CYMBALTA) 60 MG capsule Take 1 capsule (60 mg total) by mouth 2 (two) times daily. 180 capsule 1  . gabapentin (NEURONTIN) 300 MG capsule Take 1 capsule (300 mg total) by mouth 3 (three) times daily. 21 capsule 0  . HYDROcodone-acetaminophen (NORCO) 10-325 MG tablet     . hydrocortisone (ANUSOL-HC) 25 MG suppository Place 1 suppository (25 mg total) rectally 2 (two) times daily as needed for hemorrhoids. 12 suppository 2  . IRON PO Take 1 tablet by mouth 2 (two) times daily.     . methocarbamol (ROBAXIN) 500 MG tablet Take 1 tablet (500 mg total) by mouth every 6 (six)  hours as needed for muscle spasms. 30 tablet 0  . mometasone (NASONEX) 50 MCG/ACT nasal spray Place 2 sprays into the nose daily. (Patient taking differently: Place 1 spray into the nose 2 (two) times daily. ) 17 g 12  . montelukast (SINGULAIR) 10 MG tablet Take 1 tablet (10 mg total) by mouth daily. 90 tablet 3  . nystatin (MYCOSTATIN) 100000 UNIT/ML suspension     . Olopatadine HCl 0.2 % SOLN Apply 1 drop to eye daily. 2.5 mL 11  . oxyCODONE (OXY IR/ROXICODONE) 5 MG immediate release tablet Take 1-2 tablets (5-10 mg total) by mouth every 4 (four) hours as needed for moderate pain or severe pain. 40 tablet 0  . pantoprazole (PROTONIX) 40 MG tablet TAKE 1 TABLET BY MOUTH TWO TIMES DAILY 180 tablet 3  . phenazopyridine (PYRIDIUM) 97 MG tablet Take 97-194 mg by mouth daily as needed for pain.    [name] PREVIDENT 5000 DRY MOUTH 1.1 % GEL dental gel Place 1 application onto teeth 2 (two) times daily.  5  . promethazine (PHENERGAN) 25 MG tablet Take 1 tablet (25 mg total) by mouth every 8 (eight) hours as needed for nausea or vomiting. 20 tablet 0  . traMADol (ULTRAM) 50 MG tablet Take 1 tablet (50 mg total) by mouth every 6 (six) hours. 28 tablet 0   No current facility-administered medications for this visit.     Allergies  Allergen Reactions  . Cephalexin Anaphylaxis  . Cephalosporins Anaphylaxis  . Penicillins Anaphylaxis    Has patient had a PCN reaction causing immediate rash, facial/tongue/throat swelling, SOB or lightheadedness with hypotension: Yes Has patient had a PCN reaction causing severe rash involving mucus membranes or skin necrosis: No Has patient had a PCN reaction that required hospitalization Yes Has patient had a PCN reaction occurring within the last 10 years: No If all of the above answers are "NO", then may proceed with Cephalosporin use.     Indication: Patient presents with symptomatic varicose veins of the right lower extremity.  Procedure: Foam sclerotherapy was  performed on the right lower extremity. Using ultrasound guidance, 5 mL of foam Sotradecol was used to inject the varicosities of the right lower extremity. Compression wraps were placed. The patient tolerated the procedure well.

## 2017-11-30 DIAGNOSIS — M25572 Pain in left ankle and joints of left foot: Secondary | ICD-10-CM | POA: Diagnosis not present

## 2017-11-30 DIAGNOSIS — M25571 Pain in right ankle and joints of right foot: Secondary | ICD-10-CM | POA: Diagnosis not present

## 2017-11-30 DIAGNOSIS — M7670 Peroneal tendinitis, unspecified leg: Secondary | ICD-10-CM | POA: Diagnosis not present

## 2017-12-02 DIAGNOSIS — L570 Actinic keratosis: Secondary | ICD-10-CM | POA: Diagnosis not present

## 2017-12-02 DIAGNOSIS — D485 Neoplasm of uncertain behavior of skin: Secondary | ICD-10-CM | POA: Diagnosis not present

## 2017-12-02 DIAGNOSIS — L219 Seborrheic dermatitis, unspecified: Secondary | ICD-10-CM | POA: Diagnosis not present

## 2017-12-02 DIAGNOSIS — L905 Scar conditions and fibrosis of skin: Secondary | ICD-10-CM | POA: Diagnosis not present

## 2017-12-02 DIAGNOSIS — D229 Melanocytic nevi, unspecified: Secondary | ICD-10-CM | POA: Diagnosis not present

## 2017-12-02 DIAGNOSIS — Z1283 Encounter for screening for malignant neoplasm of skin: Secondary | ICD-10-CM | POA: Diagnosis not present

## 2017-12-02 DIAGNOSIS — L821 Other seborrheic keratosis: Secondary | ICD-10-CM | POA: Diagnosis not present

## 2017-12-07 DIAGNOSIS — K449 Diaphragmatic hernia without obstruction or gangrene: Secondary | ICD-10-CM | POA: Diagnosis not present

## 2017-12-07 DIAGNOSIS — Z8371 Family history of colonic polyps: Secondary | ICD-10-CM | POA: Diagnosis not present

## 2017-12-07 DIAGNOSIS — K219 Gastro-esophageal reflux disease without esophagitis: Secondary | ICD-10-CM | POA: Diagnosis not present

## 2017-12-07 DIAGNOSIS — Z86010 Personal history of colonic polyps: Secondary | ICD-10-CM | POA: Diagnosis not present

## 2017-12-14 ENCOUNTER — Other Ambulatory Visit: Payer: 59

## 2017-12-15 ENCOUNTER — Other Ambulatory Visit: Payer: Self-pay

## 2017-12-15 ENCOUNTER — Encounter
Admission: RE | Admit: 2017-12-15 | Discharge: 2017-12-15 | Disposition: A | Discharge status: Home / Self Care | Payer: 59 | Source: Ambulatory Visit | Attending: Orthopedic Surgery | Admitting: Orthopedic Surgery

## 2017-12-15 DIAGNOSIS — Z01812 Encounter for preprocedural laboratory examination: Secondary | ICD-10-CM | POA: Insufficient documentation

## 2017-12-15 LAB — CBC
HCT: 43.6 % (ref 36.0–46.0)
Hemoglobin: 13.7 g/dL (ref 12.0–15.0)
MCH: 27.8 pg (ref 26.0–34.0)
MCHC: 31.4 g/dL (ref 30.0–36.0)
MCV: 88.4 fL (ref 80.0–100.0)
PLATELETS: 358 10*3/uL (ref 150–400)
RBC: 4.93 MIL/uL (ref 3.87–5.11)
RDW: 13.4 % (ref 11.5–15.5)
WBC: 6.1 10*3/uL (ref 4.0–10.5)
nRBC: 0 % (ref 0.0–0.2)

## 2017-12-15 LAB — SEDIMENTATION RATE: SED RATE: 2 mm/h (ref 0–30)

## 2017-12-15 LAB — TYPE AND SCREEN
ABO/RH(D): O NEG
ANTIBODY SCREEN: NEGATIVE

## 2017-12-15 LAB — PROTIME-INR
INR: 1.01
Prothrombin Time: 13.2 seconds (ref 11.4–15.2)

## 2017-12-15 LAB — BASIC METABOLIC PANEL
Anion gap: 7 (ref 5–15)
BUN: 10 mg/dL (ref 8–23)
CALCIUM: 9.4 mg/dL (ref 8.9–10.3)
CHLORIDE: 103 mmol/L (ref 98–111)
CO2: 30 mmol/L (ref 22–32)
Creatinine, Ser: 0.43 mg/dL — ABNORMAL LOW (ref 0.44–1.00)
GFR calc non Af Amer: >60 mL/min (ref >60)
GLUCOSE: 89 mg/dL (ref 70–99)
Potassium: 4.1 mmol/L (ref 3.5–5.1)
Sodium: 140 mmol/L (ref 135–145)

## 2017-12-15 LAB — SURGICAL PCR SCREEN
MRSA, PCR: NEGATIVE
Staphylococcus aureus: NEGATIVE

## 2017-12-15 LAB — URINALYSIS, ROUTINE W REFLEX MICROSCOPIC
Bilirubin Urine: NEGATIVE
Glucose, UA: NEGATIVE mg/dL
Ketones, ur: NEGATIVE mg/dL
Leukocytes, UA: NEGATIVE
NITRITE: POSITIVE — AB
PH: 6 (ref 5.0–8.0)
PROTEIN: NEGATIVE mg/dL
SPECIFIC GRAVITY, URINE: 1.004 — AB (ref 1.005–1.030)

## 2017-12-15 LAB — APTT: APTT: 40 s — AB (ref 24–36)

## 2017-12-15 MED ORDER — TRANEXAMIC ACID-NACL 1000-0.7 MG/100ML-% IV SOLN
1000.0000 mg | INTRAVENOUS | Status: DC
  Filled 2017-12-15: qty 100

## 2017-12-15 NOTE — Patient Instructions (Addendum)
Your procedure is scheduled on: Tuesday 12/27/17.  Report to DAY SURGERY DEPARTMENT LOCATED ON 2ND FLOOR MEDICAL MALL ENTRANCE. To find out your arrival time please call [phone] between 1PM - 3PM on Monday 12/26/17.  Remember: Instructions that are not followed completely may result in serious medical risk, up to and including death, or upon the discretion of your surgeon and anesthesiologist your surgery may need to be rescheduled.     _X__ 1. Do not eat food after midnight the night before your procedure.                 No gum chewing or hard candies. You may drink clear liquids up to 2 hours                 before you are scheduled to arrive for your surgery- DO NOT drink clear                 liquids within 2 hours of the start of your surgery.                 Clear Liquids include:  water, apple juice without pulp, clear carbohydrate                 drink such as Clearfast or Gatorade, Black Coffee or Tea (Do not add                 anything to coffee or tea).  __X__2.  On the morning of surgery brush your teeth with toothpaste and water, you may rinse your mouth with mouthwash if you wish.  Do not swallow any toothpaste or mouthwash.     _X__ 3.  No Alcohol for 24 hours before or after surgery.   __X__ 4.  Notify your doctor if there is any change in your medical condition      (cold, fever, infections).     Do not wear jewelry, make-up, hairpins, clips or nail polish. Do not wear lotions, powders, or perfumes.  Do not shave 48 hours prior to surgery. Men may shave face and neck. Do not bring valuables to the hospital.    [facility] is not responsible for any belongings or valuables.  Contacts, dentures/partials or body piercings may not be worn into surgery. Bring a case for your contacts, glasses or hearing aids, a denture cup will be supplied.  Leave your suitcase in the car. After surgery it may be brought to your room. For patients admitted to the hospital, discharge  time is determined by your treatment team.   Patients discharged the day of surgery will not be allowed to drive home.   Please read over the following fact sheets that you were given:   MRSA Information  __X__ Take these medicines the morning of surgery with A SIP OF WATER:     1. beclomethasone (QVAR) 80 MCG/ACT inhaler  2. cetirizine (ZYRTEC) 10 MG tablet  3. DULoxetine (CYMBALTA) 60 MG capsule  4. mometasone (NASONEX) 50 MCG/ACT nasal spray  5. montelukast (SINGULAIR) 10 MG tablet  6. oxyCODONE (OXY IR/ROXICODONE) 5 MG immediate release tablet if needed  7. traMADol (ULTRAM) 50 MG tablet if needed       __X__ Use CHG Soap as directed  _ X___ Use inhalers on the day of surgery.   __X__ Stop Anti-inflammatories 7 days before surgery such as Advil, Ibuprofen, Motrin, BC or Goodies Powder, Naprosyn, Naproxen, Aleve, Aspirin, Meloxicam.  Last dose on 12/20/17. May take Tylenol  if needed for pain or discomfort.

## 2017-12-16 LAB — URINE CULTURE: Culture: <10000 — AB

## 2017-12-26 MED ORDER — CLINDAMYCIN PHOSPHATE 900 MG/50ML IV SOLN
900.0000 mg | Freq: Once | INTRAVENOUS | Status: AC
  Administered 2017-12-27: 900 mg via INTRAVENOUS

## 2017-12-27 ENCOUNTER — Other Ambulatory Visit: Payer: Self-pay

## 2017-12-27 ENCOUNTER — Inpatient Hospital Stay: Payer: 59 | Admitting: Certified Registered"

## 2017-12-27 ENCOUNTER — Encounter
Admission: RE | Disposition: A | Discharge status: Home Health | Payer: Self-pay | Source: Home / Self Care | Attending: Orthopedic Surgery

## 2017-12-27 ENCOUNTER — Inpatient Hospital Stay: Payer: 59

## 2017-12-27 ENCOUNTER — Inpatient Hospital Stay
Admission: RE | Admit: 2017-12-27 | Discharge: 2017-12-29 | DRG: 470 | Disposition: A | Discharge status: Home Health | Payer: 59 | Attending: Orthopedic Surgery | Admitting: Orthopedic Surgery

## 2017-12-27 DIAGNOSIS — Z888 Allergy status to other drugs, medicaments and biological substances status: Secondary | ICD-10-CM

## 2017-12-27 DIAGNOSIS — Z79891 Long term (current) use of opiate analgesic: Secondary | ICD-10-CM | POA: Diagnosis not present

## 2017-12-27 DIAGNOSIS — Z79899 Other long term (current) drug therapy: Secondary | ICD-10-CM | POA: Diagnosis not present

## 2017-12-27 DIAGNOSIS — F329 Major depressive disorder, single episode, unspecified: Secondary | ICD-10-CM | POA: Diagnosis present

## 2017-12-27 DIAGNOSIS — E119 Type 2 diabetes mellitus without complications: Secondary | ICD-10-CM | POA: Diagnosis present

## 2017-12-27 DIAGNOSIS — K219 Gastro-esophageal reflux disease without esophagitis: Secondary | ICD-10-CM | POA: Diagnosis present

## 2017-12-27 DIAGNOSIS — I10 Essential (primary) hypertension: Secondary | ICD-10-CM | POA: Diagnosis present

## 2017-12-27 DIAGNOSIS — Z96642 Presence of left artificial hip joint: Secondary | ICD-10-CM | POA: Diagnosis not present

## 2017-12-27 DIAGNOSIS — Z8711 Personal history of peptic ulcer disease: Secondary | ICD-10-CM

## 2017-12-27 DIAGNOSIS — F418 Other specified anxiety disorders: Secondary | ICD-10-CM | POA: Diagnosis not present

## 2017-12-27 DIAGNOSIS — Z880 Allergy status to penicillin: Secondary | ICD-10-CM

## 2017-12-27 DIAGNOSIS — Z9884 Bariatric surgery status: Secondary | ICD-10-CM

## 2017-12-27 DIAGNOSIS — M1612 Unilateral primary osteoarthritis, left hip: Principal | ICD-10-CM | POA: Diagnosis present

## 2017-12-27 DIAGNOSIS — E669 Obesity, unspecified: Secondary | ICD-10-CM | POA: Diagnosis present

## 2017-12-27 DIAGNOSIS — Z6830 Body mass index (BMI) 30.0-30.9, adult: Secondary | ICD-10-CM

## 2017-12-27 DIAGNOSIS — Z8249 Family history of ischemic heart disease and other diseases of the circulatory system: Secondary | ICD-10-CM | POA: Diagnosis not present

## 2017-12-27 DIAGNOSIS — Z96641 Presence of right artificial hip joint: Secondary | ICD-10-CM | POA: Diagnosis present

## 2017-12-27 DIAGNOSIS — Z8371 Family history of colonic polyps: Secondary | ICD-10-CM

## 2017-12-27 DIAGNOSIS — Z419 Encounter for procedure for purposes other than remedying health state, unspecified: Secondary | ICD-10-CM

## 2017-12-27 DIAGNOSIS — Z471 Aftercare following joint replacement surgery: Secondary | ICD-10-CM | POA: Diagnosis not present

## 2017-12-27 DIAGNOSIS — G8918 Other acute postprocedural pain: Secondary | ICD-10-CM

## 2017-12-27 HISTORY — PX: TOTAL HIP ARTHROPLASTY: SHX124

## 2017-12-27 SURGERY — ARTHROPLASTY, HIP, TOTAL, ANTERIOR APPROACH
Anesthesia: Spinal | Laterality: Left

## 2017-12-27 MED ORDER — FENTANYL CITRATE (PF) 100 MCG/2ML IJ SOLN: 25.0000 ug | INTRAMUSCULAR | Status: DC | PRN

## 2017-12-27 MED ORDER — ONDANSETRON HCL 4 MG/2ML IJ SOLN: 4.0000 mg | Freq: Four times a day (QID) | INTRAMUSCULAR | Status: DC | PRN

## 2017-12-27 MED ORDER — MENTHOL 3 MG MT LOZG
1.0000 | LOZENGE | OROMUCOSAL | Status: DC | PRN
  Filled 2017-12-27: qty 9

## 2017-12-27 MED ORDER — LACTATED RINGERS IV SOLN
INTRAVENOUS | Status: DC
  Administered 2017-12-27: 09:00:00 via INTRAVENOUS

## 2017-12-27 MED ORDER — MIDAZOLAM HCL 2 MG/2ML IJ SOLN
INTRAMUSCULAR | Status: AC
  Filled 2017-12-27: qty 2

## 2017-12-27 MED ORDER — SODIUM CHLORIDE 0.9 % IV SOLN
INTRAVENOUS | Status: DC
  Administered 2017-12-27 (×2): via INTRAVENOUS

## 2017-12-27 MED ORDER — DIPHENHYDRAMINE HCL 12.5 MG/5ML PO ELIX: 12.5000 mg | ORAL_SOLUTION | ORAL | Status: DC | PRN

## 2017-12-27 MED ORDER — OXYCODONE HCL 5 MG PO TABS
5.0000 mg | ORAL_TABLET | ORAL | Status: DC | PRN
  Administered 2017-12-27: 10 mg via ORAL
  Filled 2017-12-27: qty 2

## 2017-12-27 MED ORDER — PROPOFOL 500 MG/50ML IV EMUL
INTRAVENOUS | Status: DC | PRN
  Administered 2017-12-27: 100 ug/kg/min via INTRAVENOUS

## 2017-12-27 MED ORDER — SODIUM CHLORIDE 0.9 % IV SOLN
INTRAVENOUS | Status: DC | PRN
  Administered 2017-12-27: 1000 mL

## 2017-12-27 MED ORDER — BUDESONIDE 0.5 MG/2ML IN SUSP
0.5000 mg | Freq: Two times a day (BID) | RESPIRATORY_TRACT | Status: DC
  Administered 2017-12-27 – 2017-12-29 (×4): 0.5 mg via RESPIRATORY_TRACT
  Filled 2017-12-27 (×4): qty 2

## 2017-12-27 MED ORDER — METOCLOPRAMIDE HCL 5 MG/ML IJ SOLN: 5.0000 mg | Freq: Three times a day (TID) | INTRAMUSCULAR | Status: DC | PRN

## 2017-12-27 MED ORDER — TRANEXAMIC ACID 1000 MG/10ML IV SOLN
1000.0000 mg | INTRAVENOUS | Status: AC
  Administered 2017-12-27: 1000 mg via INTRAVENOUS
  Filled 2017-12-27: qty 10

## 2017-12-27 MED ORDER — FENTANYL CITRATE (PF) 100 MCG/2ML IJ SOLN
INTRAMUSCULAR | Status: DC | PRN
  Administered 2017-12-27 (×2): 50 ug via INTRAVENOUS

## 2017-12-27 MED ORDER — FENTANYL CITRATE (PF) 100 MCG/2ML IJ SOLN
INTRAMUSCULAR | Status: AC
  Filled 2017-12-27: qty 2

## 2017-12-27 MED ORDER — PROMETHAZINE HCL 25 MG PO TABS
25.0000 mg | ORAL_TABLET | Freq: Three times a day (TID) | ORAL | Status: DC | PRN
  Filled 2017-12-27: qty 1

## 2017-12-27 MED ORDER — DOCUSATE SODIUM 100 MG PO CAPS
100.0000 mg | ORAL_CAPSULE | Freq: Two times a day (BID) | ORAL | Status: DC
  Administered 2017-12-27 – 2017-12-29 (×5): 100 mg via ORAL
  Filled 2017-12-27 (×5): qty 1

## 2017-12-27 MED ORDER — METHOCARBAMOL 1000 MG/10ML IJ SOLN
500.0000 mg | Freq: Four times a day (QID) | INTRAVENOUS | Status: DC | PRN
  Filled 2017-12-27: qty 5

## 2017-12-27 MED ORDER — OXYCODONE HCL 5 MG/5ML PO SOLN: 5.0000 mg | Freq: Once | ORAL | Status: DC | PRN

## 2017-12-27 MED ORDER — HYDROCORTISONE ACETATE 25 MG RE SUPP
25.0000 mg | Freq: Two times a day (BID) | RECTAL | Status: DC | PRN
  Filled 2017-12-27: qty 1

## 2017-12-27 MED ORDER — DULOXETINE HCL 60 MG PO CPEP
60.0000 mg | ORAL_CAPSULE | Freq: Two times a day (BID) | ORAL | Status: DC
  Administered 2017-12-27 – 2017-12-29 (×4): 60 mg via ORAL
  Filled 2017-12-27 (×6): qty 1

## 2017-12-27 MED ORDER — TRAMADOL HCL 50 MG PO TABS
50.0000 mg | ORAL_TABLET | Freq: Four times a day (QID) | ORAL | Status: DC
  Administered 2017-12-27 – 2017-12-29 (×3): 50 mg via ORAL
  Filled 2017-12-27 (×4): qty 1

## 2017-12-27 MED ORDER — ASPIRIN 81 MG PO CHEW
81.0000 mg | CHEWABLE_TABLET | Freq: Two times a day (BID) | ORAL | Status: DC
  Administered 2017-12-27 – 2017-12-29 (×4): 81 mg via ORAL
  Filled 2017-12-27 (×4): qty 1

## 2017-12-27 MED ORDER — MAGNESIUM HYDROXIDE 400 MG/5ML PO SUSP
30.0000 mL | Freq: Every day | ORAL | Status: DC | PRN
  Administered 2017-12-28 – 2017-12-29 (×2): 30 mL via ORAL
  Filled 2017-12-27 (×3): qty 30

## 2017-12-27 MED ORDER — PROMETHAZINE HCL 25 MG/ML IJ SOLN
INTRAMUSCULAR | Status: AC
  Filled 2017-12-27: qty 1

## 2017-12-27 MED ORDER — ONDANSETRON HCL 4 MG/2ML IJ SOLN
INTRAMUSCULAR | Status: DC | PRN
  Administered 2017-12-27: 4 mg via INTRAVENOUS

## 2017-12-27 MED ORDER — PROPOFOL 10 MG/ML IV BOLUS
INTRAVENOUS | Status: DC | PRN
  Administered 2017-12-27: 40 mg via INTRAVENOUS

## 2017-12-27 MED ORDER — BUPIVACAINE-EPINEPHRINE 0.25% -1:200000 IJ SOLN
INTRAMUSCULAR | Status: DC | PRN
  Administered 2017-12-27: 30 mL

## 2017-12-27 MED ORDER — PROMETHAZINE HCL 25 MG/ML IJ SOLN
6.2500 mg | INTRAMUSCULAR | Status: DC | PRN
  Administered 2017-12-27: 12.5 mg via INTRAVENOUS

## 2017-12-27 MED ORDER — PROPOFOL 10 MG/ML IV BOLUS
INTRAVENOUS | Status: AC
  Filled 2017-12-27: qty 20

## 2017-12-27 MED ORDER — ALUM & MAG HYDROXIDE-SIMETH 200-200-20 MG/5ML PO SUSP: 30.0000 mL | ORAL | Status: DC | PRN

## 2017-12-27 MED ORDER — GABAPENTIN 300 MG PO CAPS
300.0000 mg | ORAL_CAPSULE | Freq: Three times a day (TID) | ORAL | Status: DC
  Administered 2017-12-27 – 2017-12-29 (×6): 300 mg via ORAL
  Filled 2017-12-27 (×6): qty 1

## 2017-12-27 MED ORDER — ACETAMINOPHEN 500 MG PO TABS
1000.0000 mg | ORAL_TABLET | Freq: Four times a day (QID) | ORAL | Status: AC
  Administered 2017-12-27 – 2017-12-28 (×4): 1000 mg via ORAL
  Filled 2017-12-27 (×4): qty 2

## 2017-12-27 MED ORDER — OXYCODONE HCL 5 MG PO TABS
10.0000 mg | ORAL_TABLET | ORAL | Status: DC | PRN
  Administered 2017-12-27 – 2017-12-28 (×4): 15 mg via ORAL
  Filled 2017-12-27 (×4): qty 3

## 2017-12-27 MED ORDER — ACETAMINOPHEN 325 MG PO TABS: 325.0000 mg | ORAL_TABLET | Freq: Four times a day (QID) | ORAL | Status: DC | PRN

## 2017-12-27 MED ORDER — METOCLOPRAMIDE HCL 10 MG PO TABS: 5.0000 mg | ORAL_TABLET | Freq: Three times a day (TID) | ORAL | Status: DC | PRN

## 2017-12-27 MED ORDER — METHOCARBAMOL 500 MG PO TABS: 500.0000 mg | ORAL_TABLET | Freq: Four times a day (QID) | ORAL | Status: DC | PRN

## 2017-12-27 MED ORDER — MIDAZOLAM HCL 5 MG/5ML IJ SOLN: INTRAMUSCULAR | Status: DC | PRN

## 2017-12-27 MED ORDER — ZOLPIDEM TARTRATE 5 MG PO TABS: 5.0000 mg | ORAL_TABLET | Freq: Every evening | ORAL | Status: DC | PRN

## 2017-12-27 MED ORDER — MONTELUKAST SODIUM 10 MG PO TABS
10.0000 mg | ORAL_TABLET | Freq: Every day | ORAL | Status: DC
  Administered 2017-12-28 – 2017-12-29 (×2): 10 mg via ORAL
  Filled 2017-12-27 (×2): qty 1

## 2017-12-27 MED ORDER — TRANEXAMIC ACID-NACL 1000-0.7 MG/100ML-% IV SOLN
1000.0000 mg | INTRAVENOUS | Status: DC
  Filled 2017-12-27: qty 100

## 2017-12-27 MED ORDER — PANTOPRAZOLE SODIUM 40 MG PO TBEC
40.0000 mg | DELAYED_RELEASE_TABLET | Freq: Two times a day (BID) | ORAL | Status: DC
  Administered 2017-12-27 – 2017-12-29 (×4): 40 mg via ORAL
  Filled 2017-12-27 (×4): qty 1

## 2017-12-27 MED ORDER — MAGNESIUM CITRATE PO SOLN
1.0000 | Freq: Once | ORAL | Status: AC | PRN
  Administered 2017-12-29: 1 via ORAL
  Filled 2017-12-27 (×2): qty 296

## 2017-12-27 MED ORDER — SODIUM FLUORIDE 1.1 % DT GEL: 1.0000 "application " | Freq: Two times a day (BID) | DENTAL | Status: DC

## 2017-12-27 MED ORDER — FLUTICASONE PROPIONATE 50 MCG/ACT NA SUSP
1.0000 | Freq: Every day | NASAL | Status: DC
  Administered 2017-12-28 – 2017-12-29 (×2): 1 via NASAL
  Filled 2017-12-27: qty 16

## 2017-12-27 MED ORDER — HYDROMORPHONE HCL 1 MG/ML IJ SOLN
0.5000 mg | INTRAMUSCULAR | Status: DC | PRN
  Administered 2017-12-27 – 2017-12-28 (×3): 1 mg via INTRAVENOUS
  Filled 2017-12-27 (×3): qty 1

## 2017-12-27 MED ORDER — MEPERIDINE HCL 50 MG/ML IJ SOLN: 6.2500 mg | INTRAMUSCULAR | Status: DC | PRN

## 2017-12-27 MED ORDER — BUPIVACAINE HCL (PF) 0.5 % IJ SOLN
INTRAMUSCULAR | Status: DC | PRN
  Administered 2017-12-27: 3 mL

## 2017-12-27 MED ORDER — OXYCODONE HCL 5 MG PO TABS: 5.0000 mg | ORAL_TABLET | Freq: Once | ORAL | Status: DC | PRN

## 2017-12-27 MED ORDER — MIDAZOLAM HCL 5 MG/5ML IJ SOLN
INTRAMUSCULAR | Status: DC | PRN
  Administered 2017-12-27: 2 mg via INTRAVENOUS

## 2017-12-27 MED ORDER — CLINDAMYCIN PHOSPHATE 600 MG/50ML IV SOLN
600.0000 mg | Freq: Four times a day (QID) | INTRAVENOUS | Status: AC
  Administered 2017-12-27 (×3): 600 mg via INTRAVENOUS
  Filled 2017-12-27 (×3): qty 50

## 2017-12-27 MED ORDER — LORATADINE 10 MG PO TABS
10.0000 mg | ORAL_TABLET | Freq: Every day | ORAL | Status: DC
  Administered 2017-12-28 – 2017-12-29 (×2): 10 mg via ORAL
  Filled 2017-12-27 (×2): qty 1

## 2017-12-27 MED ORDER — PHENOL 1.4 % MT LIQD
1.0000 | OROMUCOSAL | Status: DC | PRN
  Filled 2017-12-27: qty 177

## 2017-12-27 MED ORDER — ONDANSETRON HCL 4 MG PO TABS: 4.0000 mg | ORAL_TABLET | Freq: Four times a day (QID) | ORAL | Status: DC | PRN

## 2017-12-27 MED ORDER — BISACODYL 5 MG PO TBEC
5.0000 mg | DELAYED_RELEASE_TABLET | Freq: Every day | ORAL | Status: DC | PRN
  Administered 2017-12-28 – 2017-12-29 (×2): 5 mg via ORAL
  Filled 2017-12-27 (×2): qty 1

## 2017-12-27 MED ORDER — CLINDAMYCIN PHOSPHATE 900 MG/50ML IV SOLN
INTRAVENOUS | Status: AC
  Filled 2017-12-27: qty 50

## 2017-12-27 MED ORDER — OLOPATADINE HCL 0.1 % OP SOLN
1.0000 [drp] | Freq: Two times a day (BID) | OPHTHALMIC | Status: DC
  Administered 2017-12-27 – 2017-12-29 (×4): 1 [drp] via OPHTHALMIC
  Filled 2017-12-27: qty 5

## 2017-12-27 SURGICAL SUPPLY — 59 items
BLADE SAGITTAL AGGR TOOTH XLG (BLADE) ×2 IMPLANT
BNDG COHESIVE 6X5 TAN STRL LF (GAUZE/BANDAGES/DRESSINGS) ×6 IMPLANT
CANISTER SUCT 1200ML W/VALVE (MISCELLANEOUS) ×2 IMPLANT
CHLORAPREP W/TINT 26ML (MISCELLANEOUS) ×2 IMPLANT
COVER WAND RF STERILE (DRAPES) ×2 IMPLANT
DRAPE C-ARM XRAY 36X54 (DRAPES) ×2 IMPLANT
DRAPE INCISE IOBAN 66X60 STRL (DRAPES) IMPLANT
DRAPE POUCH INSTRU U-SHP 10X18 (DRAPES) ×2 IMPLANT
DRAPE SHEET LG 3/4 BI-LAMINATE (DRAPES) ×6 IMPLANT
DRAPE TABLE BACK 80X90 (DRAPES) ×2 IMPLANT
DRESSING SURGICEL FIBRLLR 1X2 (HEMOSTASIS) ×2 IMPLANT
DRSG OPSITE POSTOP 4X8 (GAUZE/BANDAGES/DRESSINGS) ×4 IMPLANT
DRSG SURGICEL FIBRILLAR 1X2 (HEMOSTASIS) ×4
ELECT BLADE 6.5 EXT (BLADE) ×2 IMPLANT
ELECT REM PT RETURN 9FT ADLT (ELECTROSURGICAL) ×2
ELECTRODE REM PT RTRN 9FT ADLT (ELECTROSURGICAL) ×1 IMPLANT
GLOVE BIOGEL PI IND STRL 9 (GLOVE) ×1 IMPLANT
GLOVE BIOGEL PI INDICATOR 9 (GLOVE) ×1
GLOVE SURG SYN 9.0  PF PI (GLOVE) ×2
GLOVE SURG SYN 9.0 PF PI (GLOVE) ×2 IMPLANT
GOWN SRG 2XL LVL 4 RGLN SLV (GOWNS) ×1 IMPLANT
GOWN STRL NON-REIN 2XL LVL4 (GOWNS) ×2
GOWN STRL REUS W/ TWL LRG LVL3 (GOWN DISPOSABLE) ×1 IMPLANT
GOWN STRL REUS W/TWL LRG LVL3 (GOWN DISPOSABLE) ×2
HEAD FEMORAL 28MM SZ S (Head) ×2 IMPLANT
HEMOVAC 400CC 10FR (MISCELLANEOUS) IMPLANT
HOLDER FOLEY CATH W/STRAP (MISCELLANEOUS) ×2 IMPLANT
HOOD PEEL AWAY FLYTE STAYCOOL (MISCELLANEOUS) ×2 IMPLANT
KIT PREVENA INCISION MGT 13 (CANNISTER) ×2 IMPLANT
LINER DBL MOB SZ 0 52MM (Liner) ×2 IMPLANT
MAT ABSORB  FLUID 56X50 GRAY (MISCELLANEOUS) ×1
MAT ABSORB FLUID 56X50 GRAY (MISCELLANEOUS) ×1 IMPLANT
NDL SAFETY ECLIPSE 18X1.5 (NEEDLE) ×1 IMPLANT
NEEDLE HYPO 18GX1.5 SHARP (NEEDLE) ×2
NEEDLE SPNL 18GX3.5 QUINCKE PK (NEEDLE) ×2 IMPLANT
NS IRRIG 1000ML POUR BTL (IV SOLUTION) ×2 IMPLANT
PACK HIP COMPR (MISCELLANEOUS) ×2 IMPLANT
PENCIL SMOKE ULTRAEVAC 22 CON (MISCELLANEOUS) ×2 IMPLANT
SCALPEL PROTECTED #10 DISP (BLADE) ×4 IMPLANT
SHELL ACETABULAR SZ 52 DM (Shell) ×2 IMPLANT
SOL PREP PVP 2OZ (MISCELLANEOUS) ×2
SOLUTION PREP PVP 2OZ (MISCELLANEOUS) ×1 IMPLANT
SPONGE DRAIN TRACH 4X4 STRL 2S (GAUZE/BANDAGES/DRESSINGS) ×2 IMPLANT
STAPLER SKIN PROX 35W (STAPLE) ×2 IMPLANT
STEM FEMORAL SZ2 STD COLLARED (Stem) ×2 IMPLANT
STRAP SAFETY 5IN WIDE (MISCELLANEOUS) ×2 IMPLANT
SUT DVC 2 QUILL PDO  T11 36X36 (SUTURE) ×1
SUT DVC 2 QUILL PDO T11 36X36 (SUTURE) ×1 IMPLANT
SUT SILK 0 (SUTURE) ×2
SUT SILK 0 30XBRD TIE 6 (SUTURE) ×1 IMPLANT
SUT V-LOC 90 ABS DVC 3-0 CL (SUTURE) ×2 IMPLANT
SUT VIC AB 1 CT1 36 (SUTURE) ×2 IMPLANT
SYR 20CC LL (SYRINGE) ×2 IMPLANT
SYR 30ML LL (SYRINGE) ×2 IMPLANT
SYR BULB IRRIG 60ML STRL (SYRINGE) ×2 IMPLANT
TAPE MICROFOAM 4IN (TAPE) ×2 IMPLANT
TOWEL OR 17X26 4PK STRL BLUE (TOWEL DISPOSABLE) ×2 IMPLANT
TRAY FOLEY MTR SLVR 16FR STAT (SET/KITS/TRAYS/PACK) ×2 IMPLANT
WND VAC CANISTER 500ML (MISCELLANEOUS) ×2 IMPLANT

## 2017-12-27 NOTE — H&P (Signed)
Reviewed paper H+P, will be scanned into chart. No changes noted.  

## 2017-12-27 NOTE — Progress Notes (Signed)
Pt arrived to room 160 from PACU. IV infusing NS. Pt on room air. Pt refused scheduled Tramadol and wanted Oxycodone instead. Foley intact and draining. No complaints at this time.

## 2017-12-27 NOTE — Progress Notes (Signed)
Diet cola given per patient request.

## 2017-12-27 NOTE — Anesthesia Procedure Notes (Signed)
Spinal  Patient location during procedure: OR Start time: 12/27/2017 9:30 AM End time: 12/27/2017 9:33 AM Staffing Anesthesiologist: Emmie Niemann, MD Resident/CRNA: Koron Godeaux, Einar Grad, CRNA Performed: resident/CRNA  Preanesthetic Checklist Completed: patient identified, site marked, surgical consent, pre-op evaluation, timeout performed, IV checked, risks and benefits discussed and monitors and equipment checked Spinal Block Patient position: sitting Prep: ChloraPrep Patient monitoring: heart rate, continuous pulse ox and blood pressure Approach: midline Location: L4-5 Injection technique: single-shot Needle Needle type: Pencil-Tip and Introducer  Needle gauge: 24 G Needle length: 9 cm Additional Notes Negative parasthesia, free flowing CSF, negative aspiration for blood, pt tolerated procedure well with no immediate complications

## 2017-12-27 NOTE — Anesthesia Post-op Follow-up Note (Signed)
Anesthesia QCDR form completed.        

## 2017-12-27 NOTE — Progress Notes (Signed)
Chaplain responded to an OR for an AD. Chaplain gave education and explanations. Husband was at the bedside. Chaplain offered prayer and prayed for her restoration of good health.    12/27/17 1300  Clinical Encounter Type  Visited With Patient and family together  Visit Type Initial;Spiritual support  Referral From Nurse  Spiritual Encounters  Spiritual Needs Brochure;Prayer

## 2017-12-27 NOTE — Op Note (Signed)
12/27/2017  11:08 AM  PATIENT:  Bethany Scott  62 y.o. female  PRE-OPERATIVE DIAGNOSIS:  PRIMARY OSTEOARTHRITIS OF LEFT HIP  POST-OPERATIVE DIAGNOSIS:  PRIMARY OSTEOARTHRITIS OF LEFT HIP  PROCEDURE:  Procedure(s): TOTAL HIP ARTHROPLASTY ANTERIOR APPROACH (Left)  SURGEON: Laurene Footman, MD  ASSISTANTS: none  ANESTHESIA:   spinal  EBL:  Total I/O In: 500 [I.V.:500] Out: 150 [Urine:100; Blood:50]  BLOOD ADMINISTERED:none  DRAINS: none   LOCAL MEDICATIONS USED:  MARCAINE     SPECIMEN:  Source of Specimen:  Left femoral head  DISPOSITION OF SPECIMEN:  PATHOLOGY  COUNTS:  YES  TOURNIQUET:  * No tourniquets in log *  IMPLANTS: Medacta AMIS 2 standard stem with 52 mm Mpact DM cup and liner with a ceramic S 28 mm head  DICTATION: .Dragon Dictation   The patient was brought to the operating room and after spinal anesthesia was obtained patient was placed on the operative table with the ipsilateral foot into the Medacta attachment, contralateral leg on a well-padded table. C-arm was brought in and preop template x-ray taken. After prepping and draping in usual sterile fashion appropriate patient identification and timeout procedures were completed. Anterior approach to the hip was obtained and centered over the greater trochanter and TFL muscle. The subcutaneous tissue was incised hemostasis being achieved by electrocautery. TFL fascia was incised and the muscle retracted laterally deep retractor placed. The lateral femoral circumflex vessels were identified and ligated. The anterior capsule was exposed and a capsulotomy performed. The neck was identified and a femoral neck cut carried out with a saw. The head was removed without difficulty and showed sclerotic femoral head and acetabulum. Reaming was carried out to 50 mm and a 52 mm cup trial gave appropriate tightness to the acetabular component a 52 DM cup was impacted into position. The leg was then externally rotated and  ischiofemoral and pubofemoral releases carried out. The femur was sequentially broached to a size 2, size 2 standard with S head trials were placed and the final components chosen. The 2 standard stem was inserted along with a ceramic S 28 mm head and 52 mm liner. The hip was reduced and was stable the wound was thoroughly irrigated with fibrillar placed along the posterior capsule and medial neck. The deep fascia ws closed using a heavy Quill after infiltration of 30 cc of quarter percent Sensorcaine with epinephrine.3-0 V-loc to close the skin with skin staples.  Incisional wound VAC applied and patient was sent to recovery in stable condition.   PLAN OF CARE: Admit to inpatient

## 2017-12-27 NOTE — Anesthesia Preprocedure Evaluation (Signed)
Anesthesia Evaluation  Patient identified by MRN, date of birth, ID band Patient awake    Reviewed: Allergy & Precautions, NPO status , Patient's Chart, lab work & pertinent test results  History of Anesthesia Complications (+) PONV and history of anesthetic complications  Airway Mallampati: II  TM Distance: >3 FB Neck ROM: Full    Dental no notable dental hx.    Pulmonary asthma , neg sleep apnea, former smoker,    breath sounds clear to auscultation- rhonchi (-) wheezing      Cardiovascular hypertension, Pt. on medications (-) CAD, (-) Past MI, (-) Cardiac Stents and (-) CABG  Rhythm:Regular Rate:Normal - Systolic murmurs and - Diastolic murmurs    Neuro/Psych  Headaches, neg Seizures PSYCHIATRIC DISORDERS Anxiety Depression    GI/Hepatic Neg liver ROS, PUD, GERD  ,  Endo/Other  diabetes  Renal/GU negative Renal ROS     Musculoskeletal  (+) Arthritis ,   Abdominal (+) + obese,   Peds  Hematology  (+) anemia ,   Anesthesia Other Findings Past Medical History: No date: Allergy No date: Anemia No date: Anxiety No date: Arthritis     Comment:  OA Knee; non-specific autoimmune process followed by               [facility]/Rheumatology. No date: Asthma YRS AGO: Blood transfusion without reported diagnosis No date: Depression No date: Diabetes mellitus without complication ([place]) YRS AGO: Gastric ulcer, unspecified as acute or chronic, without  mention of hemorrhage, perforation, or obstruction No date: GERD (gastroesophageal reflux disease) No date: Hyperlipidemia No date: Hypertension No date: Migraine     Comment:  hx of migraines No date: Obesity, unspecified No date: Other chronic cystitis No date: Personal history of colonic polyps No date: PONV (postoperative nausea and vomiting)     Comment:  NAUSEA, SCOPOLAMINE PATCH HELPED WITH LAST SURGERIES No date: Pre-diabetes   Reproductive/Obstetrics                             Lab Results  Component Value Date   WBC 6.1 12/15/2017   HGB 13.7 12/15/2017   HCT 43.6 12/15/2017   MCV 88.4 12/15/2017   PLT 358 12/15/2017    Anesthesia Physical Anesthesia Plan  ASA: III  Anesthesia Plan: Spinal   Post-op Pain Management:    Induction:   PONV Risk Score and Plan: 3 and Propofol infusion  Airway Management Planned: Natural Airway  Additional Equipment:   Intra-op Plan:   Post-operative Plan:   Informed Consent: I have reviewed the patients History and Physical, chart, labs and discussed the procedure including the risks, benefits and alternatives for the proposed anesthesia with the patient or authorized representative who has indicated his/her understanding and acceptance.   Dental advisory given  Plan Discussed with: CRNA and Anesthesiologist  Anesthesia Plan Comments:         Anesthesia Quick Evaluation

## 2017-12-27 NOTE — Evaluation (Signed)
Physical Therapy Evaluation Patient Details Name: Bethany Scott MRN: [id number] DOB: 1955-04-20 Today's Date: 12/27/2017   History of Present Illness  Pt is a 62 yo F s/p L THA, WBAT. PHM includes HTN, PUD, DM, and HLD.    Clinical Impression  Patient alert in bed with sensation intact at start of session, able to flex toes/ankles. Reported 9/10 pain for L hip, nursing staff aware pt requesting further pain medication, but willing to attempt to work with physical therapy. Pt reported prior to surgery, recently had R THA performed (3 months ago). Pt independent at baseline in two story home (Able to stay on first floor), stated she has help 24/7 at home if needed. Works, drives.   Patient able to participate in therapeutic exercises with mod verbal cues for exercise technique, AAROM from L hip abduction/heel slides. Mobilized to EOB mod I, no complaints of dizziness or increased pain. Sit <> stand with CGA and RW, able to take a few shuffling steps to chair. CGA throughout mobility, pt did not demonstrate any LOB, just decreased gait velocity and needed verbal cues for proper AD management. The patient demonstrated limitations in functional abilities and deficits listed below (see "PT problem list") and would benefit from skilled PT intervention to address these and return pt to PLOF.    Follow Up Recommendations Home health PT    Equipment Recommendations  Rolling walker with 5" wheels;Other (comment)(Pt may be able to acquire RW to borrow, does not have her own)    Recommendations for Other Services       Precautions / Restrictions Precautions Precautions: Fall Restrictions Weight Bearing Restrictions: Yes LLE Weight Bearing: Weight bearing as tolerated      Mobility  Bed Mobility Overal bed mobility: Modified Independent                Transfers Overall transfer level: Needs assistance Equipment used: Rolling walker (2 wheeled) Transfers: Sit to/from Stand Sit to Stand:  Min guard;From elevated surface            Ambulation/Gait Ambulation/Gait assistance: Min guard Gait Distance (Feet): 2 Feet Assistive device: Rolling walker (2 wheeled) Gait Pattern/deviations: Shuffle Gait velocity: decreased   General Gait Details: Pt able to take shuffling steps towards chair with CGA and RW.  Stairs            Wheelchair Mobility    Modified Rankin (Stroke Patients Only)       Balance Overall balance assessment: Needs assistance Sitting-balance support: Feet supported Sitting balance-Leahy Scale: Good     Standing balance support: Bilateral upper extremity supported Standing balance-Leahy Scale: Fair                               Pertinent Vitals/Pain Pain Assessment: 0-10 Pain Score: 9  Pain Descriptors / Indicators: Aching;Sore;Tender Pain Intervention(s): Limited activity within patient's tolerance;Patient requesting pain meds-RN notified;Monitored during session;Ice applied;Premedicated before session;Repositioned    Home Living Family/patient expects to be discharged to:: Private residence Living Arrangements: Spouse/significant other;Children Available Help at Discharge: Available 24 hours/day Type of Home: House Home Access: Stairs to enter Entrance Stairs-Rails: Left;Right;Can reach both Entrance Stairs-Number of Steps: 3 Home Layout: Two level;Able to live on main level with bedroom/bathroom;1/2 bath on main level Home Equipment: Bedside commode;Walker - 4 wheels Additional Comments: has access to rollator, may have access to RW.    Prior Function Level of Independence: Independent  Comments: works full time for [employer], has RW at home     Hand Dominance        Extremity/Trunk Assessment   Upper Extremity Assessment Upper Extremity Assessment: Overall WFL for tasks assessed    Lower Extremity Assessment Lower Extremity Assessment: RLE deficits/detail;LLE deficits/detail RLE Deficits /  Details: WFLs for tasks assessed, does have R knee pain due to previously subluxed patella per patient report LLE: Unable to fully assess due to pain       Communication   Communication: No difficulties  Cognition Arousal/Alertness: Awake/alert Behavior During Therapy: WFL for tasks assessed/performed Overall Cognitive Status: Within Functional Limits for tasks assessed                                        General Comments      Exercises Total Joint Exercises Ankle Circles/Pumps: AROM;Both;20 reps Quad Sets: AROM;Strengthening;Both;20 reps Gluteal Sets: AROM;Strengthening;Both;20 reps Heel Slides: AROM;Strengthening;Left;10 reps Hip ABduction/ADduction: AROM;Strengthening;Left;10 reps   Assessment/Plan    PT Assessment Patient needs continued PT services  PT Problem List Decreased strength;Decreased range of motion;Decreased knowledge of use of DME;Decreased activity tolerance;Decreased balance;Pain;Decreased mobility       PT Treatment Interventions DME instruction;Balance training;Gait training;Neuromuscular re-education;Stair training;Functional mobility training;Patient/family education;Therapeutic activities;Therapeutic exercise    PT Goals (Current goals can be found in the Care Plan section)  Acute Rehab PT Goals Patient Stated Goal: to go home, return to PLOF PT Goal Formulation: With patient Time For Goal Achievement: 01/10/18 Potential to Achieve Goals: Good    Frequency BID   Barriers to discharge        Co-evaluation               AM-PAC PT "6 Clicks" Mobility  Outcome Measure Help needed turning from your back to your side while in a flat bed without using bedrails?: None Help needed moving from lying on your back to sitting on the side of a flat bed without using bedrails?: None Help needed moving to and from a bed to a chair (including a wheelchair)?: A Little Help needed standing up from a chair using your arms (e.g.,  wheelchair or bedside chair)?: A Little Help needed to walk in hospital room?: A Little Help needed climbing 3-5 steps with a railing? : A Lot 6 Click Score: 19    End of Session Equipment Utilized During Treatment: Gait belt Activity Tolerance: Patient tolerated treatment well Patient left: with chair alarm set;in chair;with family/visitor present;with SCD's reapplied;Other (comment)(heels elevated) Nurse Communication: Mobility status PT Visit Diagnosis: Unsteadiness on feet (R26.81);Other abnormalities of gait and mobility (R26.89);Difficulty in walking, not elsewhere classified (R26.2);Pain Pain - Right/Left: Left Pain - part of body: Hip    Time: 0370-4888 PT Time Calculation (min) (ACUTE ONLY): 33 min   Charges:   PT Evaluation $PT Eval Low Complexity: 1 Low PT Treatments $Therapeutic Activity: 8-22 mins       Lieutenant Diego PT, DPT 5:10 PM,12/27/17 [phone]

## 2017-12-27 NOTE — Transfer of Care (Signed)
Immediate Anesthesia Transfer of Care Note  Patient: Bethany Scott  Procedure(s) Performed: TOTAL HIP ARTHROPLASTY ANTERIOR APPROACH (Left )  Patient Location: PACU  Anesthesia Type:Spinal  Level of Consciousness: awake, alert  and oriented  Airway & Oxygen Therapy: Patient Spontanous Breathing  Post-op Assessment: Report given to RN and Post -op Vital signs reviewed and stable  Post vital signs: Reviewed and stable  Last Vitals:  Vitals Value Taken Time  BP    Temp    Pulse    Resp    SpO2      Last Pain:  Vitals:   12/27/17 0841  TempSrc: Temporal  PainSc: 0-No pain         Complications: No apparent anesthesia complications

## 2017-12-27 NOTE — NC FL2 (Signed)
[place] LEVEL OF CARE SCREENING TOOL     IDENTIFICATION  Patient Name: Bethany Scott Birthdate: [birth year] Sex: female Admission Date (Current Location): 12/27/2017  [place] and [place] Number:  Engineering geologist and Address:  [facility], [address], [place], [place] [address]      Provider Number: [id number]  Attending Physician Name and Address:  [name], MD  Relative Name and Phone Number:       Current Level of Care: Hospital Recommended Level of Care: [place] Prior Approval Number:    Date Approved/Denied:   PASRR Number: [id number]  Discharge Plan: SNF    Current Diagnoses: Patient Active Problem List   Diagnosis Date Noted  . Status post total hip replacement, 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  . Status post total hip replacement, right 09/27/2017  . Chronic cystitis 02/08/2017  . Hematuria, microscopic 02/08/2017  . History of nephrolithiasis 02/08/2017  . Chronic venous insufficiency 01/06/2017  . Varicose veins of leg with pain, right 12/31/2016  . GERD (gastroesophageal reflux disease) 03/01/2016  . 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 Hip arthritis 05/29/2015  . Degenerative joint disease (DJD) of hip 07/24/2014  . DJD (degenerative joint disease) of knee 06/17/2014  . Greater trochanteric bursitis of both hips 06/17/2014  . Degenerative joint disease of sacroiliac joint 06/17/2014  . Facet syndrome, lumbar 06/17/2014  . DDD (degenerative disc disease), lumbar 06/17/2014  . Low serum vitamin D 06/08/2013  . Sinusitis, acute, maxillary 02/08/2012  . Migraines 02/08/2012  . Depression with anxiety 02/08/2012  . Essential hypertension, benign 02/08/2012  . Pure hypercholesterolemia 02/08/2012  . Diabetes mellitus type 2 in obese ([place]) 02/08/2012    Orientation RESPIRATION BLADDER Height & Weight     Self, Time, Situation, Place  Normal Continent Weight: 196 lb (88.9 kg) Height:   5\' 7"  (170.2 cm)  BEHAVIORAL SYMPTOMS/MOOD NEUROLOGICAL BOWEL NUTRITION STATUS  (none) (none) Continent Diet(Regular diet )  AMBULATORY STATUS COMMUNICATION OF NEEDS Skin   Extensive Assist Verbally Surgical wounds(Incision left hip, incision abdomen )                       Personal Care Assistance Level of Assistance  Bathing, Feeding, Dressing Bathing Assistance: Limited assistance Feeding assistance: Independent Dressing Assistance: Limited assistance     Functional Limitations Info  Sight, Hearing, Speech Sight Info: Adequate Hearing Info: Adequate Speech Info: Adequate    SPECIAL CARE FACTORS FREQUENCY  PT (By licensed PT), OT (By licensed OT)     PT Frequency: 5 OT Frequency: 5            Contractures Contractures Info: Not present    Additional Factors Info  Code Status, Allergies Code Status Info: Full Code  Allergies Info: Cephalexin, Cephalosporins, Penicillins           Current Medications (12/27/2017):  This is the current hospital active medication list Current Facility-Administered Medications  Medication Dose Route Frequency Provider Last Rate Last Dose  . 0.9 %  sodium chloride infusion   Intravenous Continuous [name], MD 100 mL/hr at 12/27/17 1242    . acetaminophen (TYLENOL) tablet 1,000 mg  1,000 mg Oral Q6H [name], MD   1,000 mg at 12/27/17 1706  . [START ON 12/28/2017] acetaminophen (TYLENOL) tablet 325-650 mg  325-650 mg Oral Q6H PRN [name], MD      . alum & mag hydroxide-simeth (MAALOX/MYLANTA) 200-200-20 MG/5ML suspension 30 mL  30 mL Oral Q4H PRN  [name], MD      . aspirin chewable tablet 81 mg  81 mg Oral BID [name], MD      . bisacodyl (DULCOLAX) EC tablet 5 mg  5 mg Oral Daily PRN [name], MD      . budesonide (PULMICORT) nebulizer solution 0.5 mg  0.5 mg Nebulization BID [name], MD      . clindamycin (CLEOCIN) 900 MG/50ML IVPB           . clindamycin (CLEOCIN) IVPB 600 mg  600 mg  Intravenous Q6H [name], MD 100 mL/hr at 12/27/17 1346 600 mg at 12/27/17 1346  . diphenhydrAMINE (BENADRYL) 12.5 MG/5ML elixir 12.5-25 mg  12.5-25 mg Oral Q4H PRN [name], MD      . docusate sodium (COLACE) capsule 100 mg  100 mg Oral BID [name], MD   100 mg at 12/27/17 1239  . DULoxetine (CYMBALTA) DR capsule 60 mg  60 mg Oral BID [name], MD      . fluticasone [facility]) 50 MCG/ACT nasal spray 1 spray  1 spray Each Nare Daily [name], MD      . gabapentin (NEURONTIN) capsule 300 mg  300 mg Oral TID [name], MD   300 mg at 12/27/17 1510  . hydrocortisone (ANUSOL-HC) suppository 25 mg  25 mg Rectal BID PRN [name], MD      . HYDROmorphone (DILAUDID) injection 0.5-1 mg  0.5-1 mg Intravenous Q4H PRN [name], MD   1 mg at 12/27/17 1511  . loratadine (CLARITIN) tablet 10 mg  10 mg Oral Daily [name], MD      . magnesium citrate solution 1 Bottle  1 Bottle Oral Once PRN [name], MD      . magnesium hydroxide (MILK OF MAGNESIA) suspension 30 mL  30 mL Oral Daily PRN [name], MD      . menthol-cetylpyridinium (CEPACOL) lozenge 3 mg  1 lozenge Oral PRN [name], MD       Or  . phenol (CHLORASEPTIC) mouth spray 1 spray  1 spray Mouth/Throat PRN [name], MD      . methocarbamol (ROBAXIN) tablet 500 mg  500 mg Oral Q6H PRN [name], MD       Or  . methocarbamol (ROBAXIN) 500 mg in dextrose 5 % 50 mL IVPB  500 mg Intravenous Q6H PRN [name], MD      . metoCLOPramide (REGLAN) tablet 5-10 mg  5-10 mg Oral Q8H PRN [name], MD       Or  . metoCLOPramide (REGLAN) injection 5-10 mg  5-10 mg Intravenous Q8H PRN [name], MD      . montelukast (SINGULAIR) tablet 10 mg  10 mg Oral Daily [name], MD      . olopatadine (PATANOL) 0.1 % ophthalmic solution 1 drop  1 drop Both Eyes BID [name], MD      . ondansetron [facility]) tablet 4 mg  4 mg Oral Q6H PRN [name], MD       Or  . ondansetron [facility])  injection 4 mg  4 mg Intravenous Q6H PRN [name], MD      . oxyCODONE (Oxy IR/ROXICODONE) immediate release tablet 10-15 mg  10-15 mg Oral Q4H PRN [name], MD   15 mg at 12/27/17 1706  . oxyCODONE (Oxy IR/ROXICODONE) immediate release tablet 5-10 mg  5-10 mg Oral Q4H PRN [name], MD   10 mg at 12/27/17 1239  . pantoprazole (PROTONIX)  EC tablet 40 mg  40 mg Oral BID [name], MD      . promethazine (PHENERGAN) 25 MG/ML injection           . promethazine (PHENERGAN) tablet 25 mg  25 mg Oral Q8H PRN [name], MD      . traMADol Veat[facility]) tablet 50 mg  50 mg Oral Q6H [name], MD      . zolpidem [facility]) tablet 5 mg  5 mg Oral QHS PRN [name], MD         Discharge Medications: Please see discharge summary for a list of discharge medications.  Relevant Imaging Results:  Relevant Lab Results:   Additional Information SSN: [id number]  Annamaria Boots, [place]

## 2017-12-27 NOTE — Progress Notes (Signed)
15 minute call to floor. 

## 2017-12-28 ENCOUNTER — Encounter: Payer: Self-pay | Admitting: Orthopedic Surgery

## 2017-12-28 MED ORDER — OXYCODONE HCL 5 MG PO TABS
10.0000 mg | ORAL_TABLET | ORAL | Status: DC | PRN
  Administered 2017-12-28 – 2017-12-29 (×8): 15 mg via ORAL
  Filled 2017-12-28 (×8): qty 3

## 2017-12-28 MED ORDER — OXYCODONE HCL 5 MG PO TABS: 5.0000 mg | ORAL_TABLET | ORAL | Status: DC | PRN

## 2017-12-28 NOTE — Progress Notes (Signed)
PT Cancellation Note  Patient Details Name: CASHE GATT MRN: [id number] DOB: [birth year]   Cancelled Treatment:    Reason Eval/Treat Not Completed: Pain limiting ability to participate;Other (comment)(Pt with complaints of significant pain upon entering the room, requesting PT to wait until she had pain medications prior to session. Nursing staff entered room as PT was exiting to administer medications.)  Lieutenant Diego PT, DPT 9:05 AM,12/28/17 [phone]

## 2017-12-28 NOTE — Anesthesia Postprocedure Evaluation (Signed)
Anesthesia Post Note  Patient: Bethany Scott  Procedure(s) Performed: TOTAL HIP ARTHROPLASTY ANTERIOR APPROACH (Left )  Patient location during evaluation: Nursing Unit Anesthesia Type: Spinal Level of consciousness: oriented and awake and alert Pain management: pain level controlled Vital Signs Assessment: post-procedure vital signs reviewed and stable Respiratory status: spontaneous breathing and respiratory function stable Cardiovascular status: blood pressure returned to baseline and stable Postop Assessment: no headache, no backache, no apparent nausea or vomiting and patient able to bend at knees Anesthetic complications: no     Last Vitals:  Vitals:   12/28/17 0324 12/28/17 0731  BP: 113/62 103/64  Pulse: 81 81  Resp: 18   Temp: 36.7 C 36.7 C  SpO2: 100% 100%    Last Pain:  Vitals:   12/28/17 0731  TempSrc: Oral  PainSc:                  Hedda Slade

## 2017-12-28 NOTE — Progress Notes (Signed)
   Subjective: 1 Day Post-Op Procedure(s) (LRB): TOTAL HIP ARTHROPLASTY ANTERIOR APPROACH (Left) Patient reports pain as mild.   Patient is well, and has had no acute complaints or problems Denies any CP, SOB, ABD pain. We will continue therapy today.  Plan is to go Home after hospital stay.  Objective: Vital signs in last 24 hours: Temp:  [97.4 F (36.3 C)-98.7 F (37.1 C)] 98 F (36.7 C) (12/04 0731) Pulse Rate:  [57-96] 81 (12/04 0731) Resp:  [13-25] 18 (12/04 0324) BP: (96-154)/(57-91) 103/64 (12/04 0731) SpO2:  [97 %-100 %] 100 % (12/04 0731) Weight:  [88.9 kg] 88.9 kg (12/03 0841)  Intake/Output from previous day: 12/03 0701 - 12/04 0700 In: 2473.4 [P.O.:240; I.V.:2083.4; IV Piggyback:150] Out: 1300 [Urine:1250; Blood:50] Intake/Output this shift: No intake/output data recorded.  No results for input(s): HGB in the last 72 hours. No results for input(s): WBC, RBC, HCT, PLT in the last 72 hours. No results for input(s): NA, K, CL, CO2, BUN, CREATININE, GLUCOSE, CALCIUM in the last 72 hours. No results for input(s): LABPT, INR in the last 72 hours.  EXAM General - Patient is Alert, Appropriate and Oriented Extremity - Neurovascular intact Sensation intact distally Intact pulses distally Dorsiflexion/Plantar flexion intact No cellulitis present Compartment soft Dressing - dressing C/D/I and no drainage, provena intact Motor Function - intact, moving foot and toes well on exam.   Past Medical History:  Diagnosis Date  . Allergy   . Anemia   . Anxiety   . Arthritis    OA Knee; non-specific autoimmune process followed by [facility]heumatology.  . Asthma   . Blood transfusion without reported diagnosis YRS AGO  . Depression   . Diabetes mellitus without complication ([place])   . Gastric ulcer, unspecified as acute or chronic, without mention of hemorrhage, perforation, or obstruction YRS AGO  . GERD (gastroesophageal reflux disease)   . Hyperlipidemia   .  Hypertension   . Migraine    hx of migraines  . Obesity, unspecified   . Other chronic cystitis   . Personal history of colonic polyps   . PONV (postoperative nausea and vomiting)    NAUSEA, SCOPOLAMINE PATCH HELPED WITH LAST SURGERIES  . Pre-diabetes     Assessment/Plan:   1 Day Post-Op Procedure(s) (LRB): TOTAL HIP ARTHROPLASTY ANTERIOR APPROACH (Left) Active Problems:   Status post total hip replacement, left  Estimated body mass index is 30.7 kg/m as calculated from the following:   Height as of this encounter: 5\' 7"  (1.702 m).   Weight as of this encounter: 88.9 kg. Advance diet Up with therapy  Needs BM Check labs this am VSS CM to assist with discharge to home with HHPT   DVT Prophylaxis - Aspirin, TED hose and SCDs Weight-Bearing as tolerated to left leg   T. Rachelle Hora, PA-C [place] 12/28/2017, 8:15 AM

## 2017-12-28 NOTE — Care Management Note (Signed)
Case Management Note  Patient Details  Name: Bethany Scott MRN: [id number] Date of Birth: [birth year]  Subjective/Objective:                  RNCM spoke with patient regarding discharge to home. She has a borrowed walker that she can use at home. She has used [facility] and would like to use them again. She uses [employer] and [facility] on [address]. She has a plan worked out for 24/7 assistance in the home with her husband and other family members.   Action/Plan:  Referral to [facility].  RNCM will follow.   Expected Discharge Date:                  Expected Discharge Plan:     In-House Referral:     Discharge planning Services  CM Consult  Post Acute Care Choice:  Home Health Choice offered to:  Patient  DME Arranged:    DME Agency:     HH Arranged:  PT [place]:  [facility] (formerly [facility])  Status of Service:  In process, will continue to follow  If discussed at Long Length of Stay Meetings, dates discussed:    Additional Comments:  Marshell Garfinkel, RN 12/28/2017, 9:50 AM

## 2017-12-28 NOTE — Progress Notes (Signed)
PT Cancellation Note  Patient Details Name: Bethany Scott MRN: [id number] DOB: [birth year]   Cancelled Treatment:    Reason Eval/Treat Not Completed: Pain limiting ability to participate(Pt attempted x2 this afternoon, declining PT due to significant pain and requesting to rest/sleep. PT will follow up in AM as able.)   Lieutenant Diego PT, DPT 3:59 PM,12/28/17 [phone]

## 2017-12-28 NOTE — Progress Notes (Signed)
OT Screen Note  Patient Details Name: Bethany Scott MRN: [id number] DOB: 1955/02/04   Cancelled Treatment:    Reason Eval/Treat Not Completed: OT screened, no needs identified, will sign off. Order received, chart reviewed. This therapist evaluated pt after previous hip surgery. Pt verbalizes plan for return home with appropriate safety measures taken, reports will have family to assist her with compression stockings and to provide supervision/assist as needed. Has all DME/AE and able to verbalize how to use safely. Pt politely denies additional OT needs. No skilled acute OT needs identified. Will sign off. Please re-consult if additional acute needs arise during this admission.  Jeni Salles, MPH, MS, OTR/L ascom [phone] 12/28/17, 2:15 PM

## 2017-12-28 NOTE — Progress Notes (Signed)
Clinical Social Worker (CSW) received SNF consult. PT is recommending home health. RN case manager aware of above. Please reconsult if future social work needs arise. CSW signing off.   Johnrobert Foti, LCSW [phone] 

## 2017-12-28 NOTE — Progress Notes (Signed)
Physical Therapy Treatment Patient Details Name: Bethany Scott MRN: [id number] DOB: 1955-09-15 Today's Date: 12/28/2017    History of Present Illness Pt is a 62 yo F s/p L THA, WBAT. PHM includes HTN, PUD, DM, and HLD.      PT Comments    Patient and alert and agreeable to PT at start of session, L hip pain 6/10. Pt mobilized from commode to EOB with RW and CGA/supervision. Pt able to participate in some therapeutic exercises with verbal/visual cues without physical assist. Session focused on progressing ambulation distance, pt able to ambulate ~142ft with RW and CGA, progressed from step to pattern to step through, as well as improved gait velocity with verbal and visual cues. Pt up in chair with all needs in reach at end of session. The pt would benefit from further skilled PT intervention to continue to progress towards goals and PLOF.      Follow Up Recommendations  Home health PT     Equipment Recommendations  Rolling walker with 5" wheels;Other (comment)    Recommendations for Other Services       Precautions / Restrictions Precautions Precautions: Fall;Anterior Hip Precaution Booklet Issued: Yes (comment) Restrictions Weight Bearing Restrictions: Yes LLE Weight Bearing: Weight bearing as tolerated    Mobility  Bed Mobility               General bed mobility comments: Pt sitting on commode at start of session  Transfers Overall transfer level: Needs assistance Equipment used: Rolling walker (2 wheeled) Transfers: Sit to/from Stand Sit to Stand: Min guard;Supervision            Ambulation/Gait Ambulation/Gait assistance: Min guard Gait Distance (Feet): 180 Feet Assistive device: Rolling walker (2 wheeled)       General Gait Details: Pt able to progress from step to gait pattern to step through, less reliance on RW for weight bearing this session. Improved gait velocity as ambulation continued, but still decreased   Stairs              Wheelchair Mobility    Modified Rankin (Stroke Patients Only)       Balance Overall balance assessment: Needs assistance Sitting-balance support: Feet supported Sitting balance-Leahy Scale: Good     Standing balance support: Bilateral upper extremity supported Standing balance-Leahy Scale: Fair                              Cognition Arousal/Alertness: Awake/alert Behavior During Therapy: WFL for tasks assessed/performed Overall Cognitive Status: Within Functional Limits for tasks assessed                                        Exercises Total Joint Exercises Long Arc Quad: AROM;Left;15 reps Marching in Standing: Seated;15 reps;Strengthening;Both    General Comments        Pertinent Vitals/Pain Pain Assessment: 0-10 Pain Score: 6  Pain Location: L hip Pain Descriptors / Indicators: Aching;Burning Pain Intervention(s): Limited activity within patient's tolerance;Monitored during session;Repositioned;Premedicated before session    Home Living                      Prior Function            PT Goals (current goals can now be found in the care plan section) Progress towards PT goals: Progressing toward goals  Frequency    BID      PT Plan Current plan remains appropriate    Co-evaluation              AM-PAC PT "6 Clicks" Mobility   Outcome Measure  Help needed turning from your back to your side while in a flat bed without using bedrails?: None Help needed moving from lying on your back to sitting on the side of a flat bed without using bedrails?: None Help needed moving to and from a bed to a chair (including a wheelchair)?: A Little Help needed standing up from a chair using your arms (e.g., wheelchair or bedside chair)?: A Little Help needed to walk in hospital room?: A Little Help needed climbing 3-5 steps with a railing? : A Lot 6 Click Score: 19    End of Session Equipment Utilized During  Treatment: Gait belt Activity Tolerance: Patient tolerated treatment well Patient left: with chair alarm set;in chair;with family/visitor present;with SCD's reapplied;Other (comment) Nurse Communication: Mobility status PT Visit Diagnosis: Unsteadiness on feet (R26.81);Other abnormalities of gait and mobility (R26.89);Difficulty in walking, not elsewhere classified (R26.2);Pain Pain - Right/Left: Left Pain - part of body: Hip     Time: 5749-3552 PT Time Calculation (min) (ACUTE ONLY): 30 min  Charges:  $Therapeutic Activity: 23-37 mins                    Lieutenant Diego PT, DPT 12:35 PM,12/28/17 [phone]

## 2017-12-29 LAB — CBC
HCT: 30.3 % — ABNORMAL LOW (ref 36.0–46.0)
Hemoglobin: 9.7 g/dL — ABNORMAL LOW (ref 12.0–15.0)
MCH: 27.8 pg (ref 26.0–34.0)
MCHC: 32 g/dL (ref 30.0–36.0)
MCV: 86.8 fL (ref 80.0–100.0)
Platelets: 229 10*3/uL (ref 150–400)
RBC: 3.49 MIL/uL — ABNORMAL LOW (ref 3.87–5.11)
RDW: 14.4 % (ref 11.5–15.5)
WBC: 8.3 10*3/uL (ref 4.0–10.5)
nRBC: 0 % (ref 0.0–0.2)

## 2017-12-29 LAB — BASIC METABOLIC PANEL
Anion gap: 6 (ref 5–15)
BUN: 10 mg/dL (ref 8–23)
CO2: 28 mmol/L (ref 22–32)
Calcium: 8.6 mg/dL — ABNORMAL LOW (ref 8.9–10.3)
Chloride: 104 mmol/L (ref 98–111)
Creatinine, Ser: 0.5 mg/dL (ref 0.44–1.00)
GFR calc Af Amer: >60 mL/min (ref >60)
GFR calc non Af Amer: >60 mL/min (ref >60)
Glucose, Bld: 135 mg/dL — ABNORMAL HIGH (ref 70–99)
Potassium: 3.9 mmol/L (ref 3.5–5.1)
Sodium: 138 mmol/L (ref 135–145)

## 2017-12-29 LAB — SURGICAL PATHOLOGY

## 2017-12-29 MED ORDER — BISACODYL 10 MG RE SUPP: 10.0000 mg | Freq: Every day | RECTAL | Status: DC | PRN

## 2017-12-29 MED ORDER — BISACODYL 5 MG PO TBEC: 5.0000 mg | DELAYED_RELEASE_TABLET | Freq: Every day | ORAL | 0 refills | Status: DC | PRN

## 2017-12-29 MED ORDER — ACETAMINOPHEN 500 MG PO TABS: 500.0000 mg | ORAL_TABLET | Freq: Four times a day (QID) | ORAL | Status: DC | PRN

## 2017-12-29 MED ORDER — ASPIRIN 81 MG PO CHEW: 81.0000 mg | CHEWABLE_TABLET | Freq: Two times a day (BID) | ORAL | 0 refills | Status: AC

## 2017-12-29 MED ORDER — FLEET ENEMA 7-19 GM/118ML RE ENEM: 1.0000 | ENEMA | Freq: Once | RECTAL | Status: DC

## 2017-12-29 MED ORDER — OXYCODONE HCL 10 MG PO TABS: 10.0000 mg | ORAL_TABLET | ORAL | 0 refills | Status: DC | PRN

## 2017-12-29 NOTE — Progress Notes (Signed)
Pt. Discharged to home via family vehicle. Discharge instructions and medication regimen reviewed at bedside with patient. Pt. verbalizes understanding of instructions and medication regimen. Prescriptions given to pt. Patient assessment unchanged from this morning. IV discontinued per policy.

## 2017-12-29 NOTE — Progress Notes (Signed)
   Subjective: 2 Days Post-Op Procedure(s) (LRB): TOTAL HIP ARTHROPLASTY ANTERIOR APPROACH (Left) Patient reports pain as mild.   Patient is well, and has had no acute complaints or problems Denies any CP, SOB, ABD pain. We will continue therapy today.  Plan is to go Home after hospital stay.  Objective: Vital signs in last 24 hours: Temp:  [98.6 F (37 C)-99.5 F (37.5 C)] 98.6 F (37 C) (12/05 0749) Pulse Rate:  [84-108] 84 (12/05 0749) Resp:  [16-18] 18 (12/05 0749) BP: (103-132)/(53-64) 103/60 (12/05 0749) SpO2:  [97 %-100 %] 97 % (12/05 0749)  Intake/Output from previous day: 12/04 0701 - 12/05 0700 In: 240 [P.O.:240] Out: 0  Intake/Output this shift: No intake/output data recorded.  Recent Labs    12/29/17 0259  HGB 9.7*   Recent Labs    12/29/17 0259  WBC 8.3  RBC 3.49*  HCT 30.3*  PLT 229   Recent Labs    12/29/17 0259  NA 138  K 3.9  CL 104  CO2 28  BUN 10  CREATININE 0.50  GLUCOSE 135*  CALCIUM 8.6*   No results for input(s): LABPT, INR in the last 72 hours.  EXAM General - Patient is Alert, Appropriate and Oriented Extremity - Neurovascular intact Sensation intact distally Intact pulses distally Dorsiflexion/Plantar flexion intact No cellulitis present Compartment soft Dressing - dressing C/D/I and no drainage, provena intact Motor Function - intact, moving foot and toes well on exam.   Past Medical History:  Diagnosis Date  . Allergy   . Anemia   . Anxiety   . Arthritis    OA Knee; non-specific autoimmune process followed by [facility]heumatology.  . Asthma   . Blood transfusion without reported diagnosis YRS AGO  . Depression   . Diabetes mellitus without complication ([place])   . Gastric ulcer, unspecified as acute or chronic, without mention of hemorrhage, perforation, or obstruction YRS AGO  . GERD (gastroesophageal reflux disease)   . Hyperlipidemia   . Hypertension   . Migraine    hx of migraines  . Obesity,  unspecified   . Other chronic cystitis   . Personal history of colonic polyps   . PONV (postoperative nausea and vomiting)    NAUSEA, SCOPOLAMINE PATCH HELPED WITH LAST SURGERIES  . Pre-diabetes     Assessment/Plan:   2 Days Post-Op Procedure(s) (LRB): TOTAL HIP ARTHROPLASTY ANTERIOR APPROACH (Left) Active Problems:   Status post total hip replacement, left  Estimated body mass index is 30.7 kg/m as calculated from the following:   Height as of this encounter: 5\' 7"  (1.702 m).   Weight as of this encounter: 88.9 kg. Advance diet Up with therapy  Needs BM Pain well controlled. Labs and vital signs are stable. CM to assist with discharge to home with [place] Discharge home with home health PT today pending bowel movement.   DVT Prophylaxis - Aspirin, TED hose and SCDs Weight-Bearing as tolerated to left leg   T. Rachelle Hora, PA-C [place] 12/29/2017, 9:22 AM

## 2017-12-29 NOTE — Discharge Instructions (Signed)
ANTERIOR APPROACH TOTAL HIP REPLACEMENT POSTOPERATIVE DIRECTIONS   Hip Rehabilitation, Guidelines Following Surgery  The results of a hip operation are greatly improved after range of motion and muscle strengthening exercises. Follow all safety measures which are given to protect your hip. If any of these exercises cause increased pain or swelling in your joint, decrease the amount until you are comfortable again. Then slowly increase the exercises. Call your caregiver if you have problems or questions.   HOME CARE INSTRUCTIONS  Remove items at home which could result in a fall. This includes throw rugs or furniture in walking pathways.   ICE to the affected hip every three hours for 30 minutes at a time and then as needed for pain and swelling.  Continue to use ice on the hip for pain and swelling from surgery. You may notice swelling that will progress down to the foot and ankle.  This is normal after surgery.  Elevate the leg when you are not up walking on it.    Continue to use the breathing machine which will help keep your temperature down.  It is common for your temperature to cycle up and down following surgery, especially at night when you are not up moving around and exerting yourself.  The breathing machine keeps your lungs expanded and your temperature down.  Do not place pillow under knee, focus on keeping the knee straight while resting  DIET You may resume your previous home diet once your are discharged from the hospital.  DRESSING / WOUND CARE / SHOWERING Please remove provena negative pressure dressing on 01/05/2018 and apply honey comb dressing. Keep dressing clean and dry at all times.  ACTIVITY Walk with your walker as instructed. Use walker as long as suggested by your caregivers. Avoid periods of inactivity such as sitting longer than an hour when not asleep. This helps prevent blood clots.  You may resume a sexual relationship in one month or when given the OK by  your doctor.  You may return to work once you are cleared by your doctor.  Do not drive a car for 6 weeks or until released by you surgeon.  Do not drive while taking narcotics.  WEIGHT BEARING Weight bearing as tolerated. Use walker/cane as needed for at least 4 weeks post op.  POSTOPERATIVE CONSTIPATION PROTOCOL Constipation - defined medically as fewer than three stools per week and severe constipation as less than one stool per week.  One of the most common issues patients have following surgery is constipation.  Even if you have a regular bowel pattern at home, your normal regimen is likely to be disrupted due to multiple reasons following surgery.  Combination of anesthesia, postoperative narcotics, change in appetite and fluid intake all can affect your bowels.  In order to avoid complications following surgery, here are some recommendations in order to help you during your recovery period.  Colace (docusate) - Pick up an over-the-counter form of Colace or another stool softener and take twice a day as long as you are requiring postoperative pain medications.  Take with a full glass of water daily.  If you experience loose stools or diarrhea, hold the colace until you stool forms back up.  If your symptoms do not get better within 1 week or if they get worse, check with your doctor.  Dulcolax (bisacodyl) - Pick up over-the-counter and take as directed by the product packaging as needed to assist with the movement of your bowels.  Take with a full  glass of water.  Use this product as needed if not relieved by Colace only.  ° °MiraLax (polyethylene glycol) - Pick up over-the-counter to have on hand.  MiraLax is a solution that will increase the amount of water in your bowels to assist with bowel movements.  Take as directed and can mix with a glass of water, juice, soda, coffee, or tea.  Take if you go more than two days without a movement. °Do not use MiraLax more than once per day. Call your  doctor if you are still constipated or irregular after using this medication for 7 days in a row. ° °If you continue to have problems with postoperative constipation, please contact the office for further assistance and recommendations.  If you experience "the worst abdominal pain ever" or develop nausea or vomiting, please contact the office immediatly for further recommendations for treatment. ° °ITCHING ° If you experience itching with your medications, try taking only a single pain pill, or even half a pain pill at a time.  You can also use Benadryl over the counter for itching or also to help with sleep.  ° °TED HOSE STOCKINGS °Wear the elastic stockings on both legs for six weeks following surgery during the day but you may remove then at night for sleeping. ° °MEDICATIONS °See your medication summary on the “After Visit Summary” that the nursing staff will review with you prior to discharge.  You may have some home medications which will be placed on hold until you complete the course of blood thinner medication.  It is important for you to complete the blood thinner medication as prescribed by your surgeon.  Continue your approved medications as instructed at time of discharge. ° °PRECAUTIONS °If you experience chest pain or shortness of breath - call 911 immediately for transfer to the hospital emergency department.  °If you develop a fever greater that 101 F, purulent drainage from wound, increased redness or drainage from wound, foul odor from the wound/dressing, or calf pain - CONTACT YOUR SURGEON.   °                                                °FOLLOW-UP APPOINTMENTS °Make sure you keep all of your appointments after your operation with your surgeon and caregivers. You should call the office at the above phone number and make an appointment for approximately two weeks after the date of your surgery or on the date instructed by your surgeon outlined in the "After Visit Summary". ° °RANGE OF MOTION  AND STRENGTHENING EXERCISES  °These exercises are designed to help you keep full movement of your hip joint. Follow your caregiver's or physical therapist's instructions. Perform all exercises about fifteen times, three times per day or as directed. Exercise both hips, even if you have had only one joint replacement. These exercises can be done on a training (exercise) mat, on the floor, on a table or on a bed. Use whatever works the best and is most comfortable for you. Use music or television while you are exercising so that the exercises are a pleasant break in your day. This will make your life better with the exercises acting as a break in routine you can look forward to.  °Lying on your back, slowly slide your foot toward your buttocks, raising your knee up off the floor. Then slowly   slide your foot back down until your leg is straight again.  Lying on your back spread your legs as far apart as you can without causing discomfort.  Lying on your side, raise your upper leg and foot straight up from the floor as far as is comfortable. Slowly lower the leg and repeat.  Lying on your back, tighten up the muscle in the front of your thigh (quadriceps muscles). You can do this by keeping your leg straight and trying to raise your heel off the floor. This helps strengthen the largest muscle supporting your knee.  Lying on your back, tighten up the muscles of your buttocks both with the legs straight and with the knee bent at a comfortable angle while keeping your heel on the floor.   IF YOU ARE TRANSFERRED TO A SKILLED REHAB FACILITY If the patient is transferred to a skilled rehab facility following release from the hospital, a list of the current medications will be sent to the facility for the patient to continue.  When discharged from the skilled rehab facility, please have the facility set up the patient's [place] prior to being released. Also, the skilled facility will be responsible  for providing the patient with their medications at time of release from the facility to include their pain medication, the muscle relaxants, and their blood thinner medication. If the patient is still at the rehab facility at time of the two week follow up appointment, the skilled rehab facility will also need to assist the patient in arranging follow up appointment in our office and any transportation needs.  MAKE SURE YOU:  Understand these instructions.  Get help right away if you are not doing well or get worse.    Pick up stool softner and laxative for home use following surgery while on pain medications. Continue to use ice for pain and swelling after surgery. Do not use any lotions or creams on the incision until instructed by your surgeon.

## 2017-12-29 NOTE — Care Management (Signed)
RNCM met with patient to deliver CMS.gov health grade for home health agencies based on her zip code.  [facility] was included.  I have notified Teresa with [facility] of patient discharge.  Patient has not other CM needs.  

## 2017-12-29 NOTE — Discharge Summary (Signed)
Physician Discharge Summary  Patient ID: Bethany Scott MRN: [id number] DOB/AGE: 62-Aug-1957 62 y.o.  Admit date: 12/27/2017 Discharge date: 12/29/2017  Admission Diagnoses:  PRIMARY OSTEOARTHRITIS OF LEFT HIP   Discharge Diagnoses: Patient Active Problem List   Diagnosis Date Noted  . Status post total hip replacement, 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  . Status post total hip replacement, right 09/27/2017  . Chronic cystitis 02/08/2017  . Hematuria, microscopic 02/08/2017  . History of nephrolithiasis 02/08/2017  . Chronic venous insufficiency 01/06/2017  . Varicose veins of leg with pain, right 12/31/2016  . GERD (gastroesophageal reflux disease) 03/01/2016  . 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 Hip arthritis 05/29/2015  . Degenerative joint disease (DJD) of hip 07/24/2014  . DJD (degenerative joint disease) of knee 06/17/2014  . Greater trochanteric bursitis of both hips 06/17/2014  . Degenerative joint disease of sacroiliac joint 06/17/2014  . Facet syndrome, lumbar 06/17/2014  . DDD (degenerative disc disease), lumbar 06/17/2014  . Low serum vitamin D 06/08/2013  . Sinusitis, acute, maxillary 02/08/2012  . Migraines 02/08/2012  . Depression with anxiety 02/08/2012  . Essential hypertension, benign 02/08/2012  . Pure hypercholesterolemia 02/08/2012  . Diabetes mellitus type 2 in obese ([place]) 02/08/2012    Past Medical History:  Diagnosis Date  . Allergy   . Anemia   . Anxiety   . Arthritis    OA Knee; non-specific autoimmune process followed by [facility]ology.  . Asthma   . Blood transfusion without reported diagnosis YRS AGO  . Depression   . Diabetes mellitus without complication ([place])   . Gastric ulcer, unspecified as acute or chronic, without mention of hemorrhage, perforation, or obstruction YRS AGO  . GERD (gastroesophageal reflux disease)   . Hyperlipidemia   . Hypertension   . Migraine    hx of migraines  . Obesity, unspecified   . Other  chronic cystitis   . Personal history of colonic polyps   . PONV (postoperative nausea and vomiting)    NAUSEA, SCOPOLAMINE PATCH HELPED WITH LAST SURGERIES  . Pre-diabetes      Transfusion: none   Consultants (if any):   Discharged Condition: Improved  Hospital Course: Bethany Scott is an 62 y.o. female who was admitted 12/27/2017 with a diagnosis of left hip osteoarthritis and went to the operating room on 12/27/2017 and underwent the above named procedures.    Surgeries: Procedure(s): TOTAL HIP ARTHROPLASTY ANTERIOR APPROACH on 12/27/2017 Patient tolerated the surgery well. Taken to PACU where she was stabilized and then transferred to the orthopedic floor.  Started on aspirin, teds, SCDs. Heels elevated on bed with rolled towels. No evidence of DVT. Negative [place]. Physical therapy started on day #1 for gait training and transfer. OT started day #1 for ADL and assisted devices.  Patient's foley was d/c on day #1. Patient's IV was d/c on day #2.  On post op day #2 patient was stable and ready for discharge to home with home health PT.  Implants: Medacta AMIS 2 standard stem with 52 mm Mpact DM cup and liner with a ceramic S 28 mm head  She was given perioperative antibiotics:  Anti-infectives (From admission, onward)   Start     Dose/Rate Route Frequency Ordered Stop   12/27/17 1215  clindamycin (CLEOCIN) IVPB 600 mg     600 mg 100 mL/hr over 30 Minutes Intravenous Every 6 hours 12/27/17 1207 12/28/17 0009   12/27/17 1018  gentamicin (GARAMYCIN) 80 mg in sodium chloride 0.9 % 500 mL irrigation  Status:  Discontinued       As needed 12/27/17 1019 12/27/17 1102   12/27/17 0855  clindamycin (CLEOCIN) 900 MG/50ML IVPB    Note to Pharmacy:  Calton Dach   : cabinet override      12/27/17 0855 12/27/17 2059   12/26/17 2230  clindamycin (CLEOCIN) IVPB 900 mg     900 mg 100 mL/hr over 30 Minutes Intravenous  Once 12/26/17 2222 12/27/17 1003    .  She was given sequential  compression devices, early ambulation, and aspirin, teds for DVT prophylaxis.  She benefited maximally from the hospital stay and there were no complications.    Recent vital signs:  Vitals:   12/29/17 0012 12/29/17 0749  BP: (!) 113/53 103/60  Pulse: 84 84  Resp: 16 18  Temp: 98.9 F (37.2 C) 98.6 F (37 C)  SpO2: 97% 97%    Recent laboratory studies:  Lab Results  Component Value Date   HGB 9.7 (L) 12/29/2017   HGB 13.7 12/15/2017   HGB 10.5 (L) 09/29/2017   Lab Results  Component Value Date   WBC 8.3 12/29/2017   PLT 229 12/29/2017   Lab Results  Component Value Date   INR 1.01 12/15/2017   Lab Results  Component Value Date   NA 138 12/29/2017   K 3.9 12/29/2017   CL 104 12/29/2017   CO2 28 12/29/2017   BUN 10 12/29/2017   CREATININE 0.50 12/29/2017   GLUCOSE 135 (H) 12/29/2017    Discharge Medications:   Allergies as of 12/29/2017      Reactions   Cephalexin Anaphylaxis   Cephalosporins Anaphylaxis   Penicillins Anaphylaxis, Other (See Comments)   Has patient had a PCN reaction causing immediate rash, facial/tongue/throat swelling, SOB or lightheadedness with hypotension: Yes Has patient had a PCN reaction causing severe rash involving mucus membranes or skin necrosis: No Has patient had a PCN reaction that required hospitalization Yes Has patient had a PCN reaction occurring within the last 10 years: No If all of the above answers are "NO", then may proceed with Cephalosporin use.      Medication List    STOP taking these medications   HYDROcodone-acetaminophen 10-325 MG tablet Commonly known as:  NORCO   traMADol 50 MG tablet Commonly known as:  ULTRAM     TAKE these medications   acetaminophen 500 MG tablet Commonly known as:  TYLENOL Take 1-2 tablets (500-1,000 mg total) by mouth every 6 (six) hours as needed for mild pain (pain score 1-3 or temp > 100.5).   aspirin 81 MG chewable tablet Chew 1 tablet (81 mg total) by mouth 2 (two) times  daily.   Betamethasone Valerate 0.12 % foam   bisacodyl 5 MG EC tablet Commonly known as:  DULCOLAX Take 1 tablet (5 mg total) by mouth daily as needed for moderate constipation.   cetirizine 10 MG tablet Commonly known as:  ZYRTEC Take 10 mg by mouth 2 (two) times daily.   clindamycin 1 % lotion Commonly known as:  CLEOCIN T   conjugated estrogens vaginal cream Commonly known as:  PREMARIN Place 1 Applicatorful vaginally at bedtime. What changed:  when to take this   diclofenac 1.3 % Ptch Commonly known as:  FLECTOR Apply one patch or a portion of patch to painful area of skin twice per day if tolerated What changed:    how much to take  how to take this  when to take this  reasons to take this   diclofenac  sodium 1 % Gel Commonly known as:  VOLTAREN Apply 1 application topically daily as needed for pain.   DULoxetine 60 MG capsule Commonly known as:  CYMBALTA Take 1 capsule (60 mg total) by mouth 2 (two) times daily.   gabapentin 300 MG capsule Commonly known as:  NEURONTIN Take 1 capsule (300 mg total) by mouth 3 (three) times daily.   hydrocortisone 25 MG suppository Commonly known as:  ANUSOL-HC Place 1 suppository (25 mg total) rectally 2 (two) times daily as needed for hemorrhoids.   IRON PO Take 1 tablet by mouth 2 (two) times daily.   methocarbamol 500 MG tablet Commonly known as:  ROBAXIN Take 1 tablet (500 mg total) by mouth every 6 (six) hours as needed for muscle spasms.   mometasone 50 MCG/ACT nasal spray Commonly known as:  NASONEX Place 2 sprays into the nose daily. What changed:    how much to take  when to take this   montelukast 10 MG tablet Commonly known as:  SINGULAIR Take 1 tablet (10 mg total) by mouth daily.   nystatin 100000 UNIT/ML suspension Commonly known as:  MYCOSTATIN   Olopatadine HCl 0.2 % Soln Apply 1 drop to eye daily.   Oxycodone HCl 10 MG Tabs Take 1-1.5 tablets (10-15 mg total) by mouth every 3 (three)  hours as needed for severe pain (pain score 7-10). What changed:    medication strength  how much to take  when to take this  reasons to take this   pantoprazole 40 MG tablet Commonly known as:  PROTONIX TAKE 1 TABLET BY MOUTH TWO TIMES DAILY   phenazopyridine 97 MG tablet Commonly known as:  PYRIDIUM Take 97-194 mg by mouth daily as needed for pain.   PREVIDENT 5000 DRY MOUTH 1.1 % Gel dental gel Generic drug:  sodium fluoride Place 1 application onto teeth 2 (two) times daily.   promethazine 25 MG tablet Commonly known as:  PHENERGAN Take 1 tablet (25 mg total) by mouth every 8 (eight) hours as needed for nausea or vomiting.   QVAR 80 MCG/ACT inhaler Generic drug:  beclomethasone Inhale 2 puffs into the lungs daily.       Diagnostic Studies: Dg Hip Operative Unilat W Or W/o Pelvis Left  Result Date: 12/27/2017 CLINICAL DATA:  Left hip replacement EXAM: OPERATIVE LEFT HIP WITH PELVIS COMPARISON:  None. FLUOROSCOPY TIME:  Radiation Exposure Index (as provided by the fluoroscopic device): 1.8 mGy If the device does not provide the exposure index: Fluoroscopy Time:  6 seconds Number of Acquired Images:  2 FINDINGS: Left hip replacement is noted in satisfactory position. No acute abnormality is noted. IMPRESSION: Status post left hip replacement Electronically Signed   By: Inez Catalina M.D.   On: 12/27/2017 11:04   Dg Hip Unilat W Or W/o Pelvis 2-3 Views Left  Result Date: 12/27/2017 CLINICAL DATA:  Postop left hip replacement EXAM: DG HIP (WITH OR WITHOUT PELVIS) 2-3V LEFT COMPARISON:  C-arm spot films of the left hip of 09/27/2017 FINDINGS: Postop films show the acetabular and femoral components of the left total hip replacement to be in good position. No complicating features are seen. IMPRESSION: Left total hip replacement components in good position with no complicating features noted. Electronically Signed   By: Ivar Drape M.D.   On: 12/27/2017 11:53    Disposition:      Follow-up Information    Duanne Guess, PA-C Follow up in 2 week(s).   Specialties:  Orthopedic Surgery, Emergency Medicine  Contact information: [place] [address] [phone]            Signed: Feliberto Gottron 12/29/2017, 9:28 AM

## 2017-12-29 NOTE — Progress Notes (Signed)
Physical Therapy Treatment Patient Details Name: Bethany Scott MRN: [id number] DOB: 05-18-1955 Today's Date: 12/29/2017    History of Present Illness Pt is a 62 yo F s/p L THA, WBAT. PHM includes HTN, PUD, DM, and HLD.      PT Comments    Patient alert and agreeable to PT at start of session, on commode in bathroom L hip pain 7/10. Transferred with supervision- CGA this session. Ambulated ~282ft with RW and supervision/CGA, step through gait pattern utilized throughout, does still have decreased gait velocity as well as increased weight bearing through bilateral UE due to L hip pain. The patient navigated 3 steps with step to gait pattern bilateral rails and CGA with good safety, mod verbal cues. Overall the pt continued to demonstrated improvements and progression towards goals and would benefit from further skilled PT intervention to return to PLOF.     Follow Up Recommendations  Home health PT     Equipment Recommendations  Rolling walker with 5" wheels;Other (comment)    Recommendations for Other Services       Precautions / Restrictions Precautions Precautions: Anterior Hip;Fall Restrictions Weight Bearing Restrictions: Yes LLE Weight Bearing: Weight bearing as tolerated    Mobility  Bed Mobility               General bed mobility comments: Pt sitting on commode at start of session  Transfers Overall transfer level: Needs assistance Equipment used: Rolling walker (2 wheeled) Transfers: Sit to/from Stand Sit to Stand: Supervision;Min guard            Ambulation/Gait Ambulation/Gait assistance: Supervision;Min guard Gait Distance (Feet): 220 Feet Assistive device: Rolling walker (2 wheeled)       General Gait Details: Pt demonstrated step through gait pattern, still with weight bearing through bilateral UE due to pain, still decreased gait velocity.    Stairs Stairs: Yes Stairs assistance: Supervision;Min guard Stair Management: Two rails;Step to  pattern Number of Stairs: 3 General stair comments: ascend and descend   Wheelchair Mobility    Modified Rankin (Stroke Patients Only)       Balance Overall balance assessment: Needs assistance Sitting-balance support: Feet supported Sitting balance-Leahy Scale: Good     Standing balance support: Bilateral upper extremity supported Standing balance-Leahy Scale: Fair                              Cognition Arousal/Alertness: Awake/alert Behavior During Therapy: WFL for tasks assessed/performed Overall Cognitive Status: Within Functional Limits for tasks assessed                                        Exercises Total Joint Exercises Short Arc Quad: AROM;Strengthening;Both;15 reps Heel Slides: AROM;Strengthening;Left;10 reps Straight Leg Raises: AAROM;Strengthening;Left;10 reps    General Comments        Pertinent Vitals/Pain Pain Assessment: 0-10 Pain Score: 7  Pain Location: L hip Pain Descriptors / Indicators: Aching;Burning Pain Intervention(s): Limited activity within patient's tolerance;Monitored during session;Repositioned    Home Living                      Prior Function            PT Goals (current goals can now be found in the care plan section) Progress towards PT goals: Progressing toward goals    Frequency  BID      PT Plan Current plan remains appropriate    Co-evaluation              AM-PAC PT "6 Clicks" Mobility   Outcome Measure  Help needed turning from your back to your side while in a flat bed without using bedrails?: None Help needed moving from lying on your back to sitting on the side of a flat bed without using bedrails?: None Help needed moving to and from a bed to a chair (including a wheelchair)?: A Little Help needed standing up from a chair using your arms (e.g., wheelchair or bedside chair)?: A Little Help needed to walk in hospital room?: A Little Help needed climbing  3-5 steps with a railing? : A Lot 6 Click Score: 19    End of Session Equipment Utilized During Treatment: Gait belt Activity Tolerance: Patient tolerated treatment well Patient left: with chair alarm set;in chair;with family/visitor present;with SCD's reapplied;Other (comment) Nurse Communication: Mobility status PT Visit Diagnosis: Unsteadiness on feet (R26.81);Other abnormalities of gait and mobility (R26.89);Difficulty in walking, not elsewhere classified (R26.2);Pain Pain - Right/Left: Left Pain - part of body: Hip     Time: 4665-9935 PT Time Calculation (min) (ACUTE ONLY): 34 min  Charges:  $Therapeutic Activity: 23-37 mins                     Lieutenant Diego PT, DPT 10:06 AM,12/29/17 [phone]

## 2017-12-30 ENCOUNTER — Other Ambulatory Visit: Payer: Self-pay | Admitting: *Deleted

## 2017-12-30 ENCOUNTER — Other Ambulatory Visit: Payer: Self-pay | Admitting: Orthopedic Surgery

## 2017-12-30 DIAGNOSIS — M25561 Pain in right knee: Secondary | ICD-10-CM

## 2017-12-30 NOTE — Patient Outreach (Signed)
[name]) Care Management  12/30/2017  Bethany Scott 08-Dec-1955 [id number]   Initial telephone outreach to complete transition of care call unsuccessful. Patient had left total hip replacement on 12/3/129 at [facility] and was discharged home 12/29/17. Left HIPAA compliant message on contact number requesting return call. Will mail unsuccessful outreach letter to patient's home address and attempt another transiton of care outreach within 4 business days.   Barrington Ellison RN,CCM,CDE [place] Management Coordinator Office Phone [phone] Office Fax [phone]

## 2018-01-02 DIAGNOSIS — J45909 Unspecified asthma, uncomplicated: Secondary | ICD-10-CM | POA: Diagnosis not present

## 2018-01-02 DIAGNOSIS — I872 Venous insufficiency (chronic) (peripheral): Secondary | ICD-10-CM | POA: Diagnosis not present

## 2018-01-02 DIAGNOSIS — M1710 Unilateral primary osteoarthritis, unspecified knee: Secondary | ICD-10-CM | POA: Diagnosis not present

## 2018-01-02 DIAGNOSIS — M5136 Other intervertebral disc degeneration, lumbar region: Secondary | ICD-10-CM | POA: Diagnosis not present

## 2018-01-02 DIAGNOSIS — E1151 Type 2 diabetes mellitus with diabetic peripheral angiopathy without gangrene: Secondary | ICD-10-CM | POA: Diagnosis not present

## 2018-01-02 DIAGNOSIS — D649 Anemia, unspecified: Secondary | ICD-10-CM | POA: Diagnosis not present

## 2018-01-02 DIAGNOSIS — F418 Other specified anxiety disorders: Secondary | ICD-10-CM | POA: Diagnosis not present

## 2018-01-02 DIAGNOSIS — G43909 Migraine, unspecified, not intractable, without status migrainosus: Secondary | ICD-10-CM | POA: Diagnosis not present

## 2018-01-02 DIAGNOSIS — Z471 Aftercare following joint replacement surgery: Secondary | ICD-10-CM | POA: Diagnosis not present

## 2018-01-03 ENCOUNTER — Other Ambulatory Visit: Payer: Self-pay | Admitting: *Deleted

## 2018-01-03 NOTE — Patient Outreach (Signed)
[facility]) Care Management  01/03/2018  Bethany Scott [birth year] [id number]  Subjective: Telephone call to patient's home  / mobile number, no answer, left HIPAA compliant voicemail message, and requested call back.    Objective:  Per KPN (Knowledge Performance Now, point of care tool) and chart review,patient hospitalized 12/27/17 -12/29/17 for PRIMARY OSTEOARTHRITIS OF LEFT HIP, status post TOTAL HIP ARTHROPLASTY ANTERIOR APPROACH (Left) on 12/27/17.   Patient hospitalized 09/27/17 -09/30/17 forPRIMARY LOCALIZED OSTEOARTHRITIS OF RIGHT HIP, status postTOTAL HIP ARTHROPLASTY ANTERIOR APPROACH (Right)on 09/27/17. Patient hospitalized 03/01/16 - 03/03/16 for obesity. Status post LAPAROSCOPIC SLEEVE GASTRECTOMY UPPER GI ENDOSCOPY on 03/01/16. Patient also has a history of diabetes and hypertension. [name] Care Management last transition of care follow up completed on 10/03/17 and last EMMI General Discharge follow up completed on 10/07/17.      Assessment:Received UMR Preoperative / Transition of care follow up referral on 12/26/17.   Transition of care follow up pending patient contact.        Plan: RNCM has send unsuccessful outreach  letter, [facility] pamphlet, will call patient for 3rd telephone outreach attempt, transition of care follow up, and proceed with case closure, within 10 business days if no return call.      Panagiotis Oelkers H. Annia Friendly, BSN, [place] Management [facility] Telephonic CM Phone: [phone] Fax: [phone]

## 2018-01-04 DIAGNOSIS — M7060 Trochanteric bursitis, unspecified hip: Secondary | ICD-10-CM | POA: Diagnosis not present

## 2018-01-04 DIAGNOSIS — Z5181 Encounter for therapeutic drug level monitoring: Secondary | ICD-10-CM | POA: Diagnosis not present

## 2018-01-04 DIAGNOSIS — G5601 Carpal tunnel syndrome, right upper limb: Secondary | ICD-10-CM | POA: Diagnosis not present

## 2018-01-04 DIAGNOSIS — M259 Joint disorder, unspecified: Secondary | ICD-10-CM | POA: Diagnosis not present

## 2018-01-05 ENCOUNTER — Other Ambulatory Visit: Payer: Self-pay | Admitting: *Deleted

## 2018-01-05 NOTE — Patient Outreach (Signed)
[facility]) Care Management  01/05/2018  Bethany Scott [birth year] [id number]    Subjective: Telephone call to patient's home  / mobile number, no answer, left HIPAA compliant voicemail message, and requested call back.     Objective:Per KPN (Knowledge Performance Now, point of care tool) and chart review,patient hospitalized 12/27/17 -12/29/17 for PRIMARY OSTEOARTHRITIS OF LEFT HIP, status post TOTAL HIP ARTHROPLASTY ANTERIOR APPROACH (Left) on 12/27/17.   Patient hospitalized 09/27/17 -09/30/17 forPRIMARY LOCALIZED OSTEOARTHRITIS OF RIGHT HIP, status postTOTAL HIP ARTHROPLASTY ANTERIOR APPROACH (Right)on 09/27/17. Patient hospitalized 03/01/16 - 03/03/16 for obesity. Status post LAPAROSCOPIC SLEEVE GASTRECTOMY UPPER GI ENDOSCOPY on 03/01/16. Patient also has a history of diabetes and hypertension. [facility] Care Management lasttransition of care follow upcompleted on 10/03/17 and last EMMI General Discharge follow up completed on 10/07/17.      Assessment:Received UMR Preoperative / Transition of care follow up referral on 12/26/17.   Transition of care follow up pending patient contact.        Plan:RNCM has send unsuccessful outreach letter, [facility] pamphlet, and will  proceed with case closure, within 10 business days if no return call.      Lovie Agresta H. Annia Friendly, BSN, [place] Management [facility] Telephonic CM Phone: [phone] Fax: [phone]

## 2018-01-06 DIAGNOSIS — E1151 Type 2 diabetes mellitus with diabetic peripheral angiopathy without gangrene: Secondary | ICD-10-CM | POA: Diagnosis not present

## 2018-01-06 DIAGNOSIS — M5136 Other intervertebral disc degeneration, lumbar region: Secondary | ICD-10-CM | POA: Diagnosis not present

## 2018-01-06 DIAGNOSIS — M1710 Unilateral primary osteoarthritis, unspecified knee: Secondary | ICD-10-CM | POA: Diagnosis not present

## 2018-01-06 DIAGNOSIS — D649 Anemia, unspecified: Secondary | ICD-10-CM | POA: Diagnosis not present

## 2018-01-06 DIAGNOSIS — J45909 Unspecified asthma, uncomplicated: Secondary | ICD-10-CM | POA: Diagnosis not present

## 2018-01-06 DIAGNOSIS — G43909 Migraine, unspecified, not intractable, without status migrainosus: Secondary | ICD-10-CM | POA: Diagnosis not present

## 2018-01-06 DIAGNOSIS — I872 Venous insufficiency (chronic) (peripheral): Secondary | ICD-10-CM | POA: Diagnosis not present

## 2018-01-06 DIAGNOSIS — F418 Other specified anxiety disorders: Secondary | ICD-10-CM | POA: Diagnosis not present

## 2018-01-06 DIAGNOSIS — Z471 Aftercare following joint replacement surgery: Secondary | ICD-10-CM | POA: Diagnosis not present

## 2018-01-09 ENCOUNTER — Other Ambulatory Visit: Payer: Self-pay | Admitting: *Deleted

## 2018-01-09 DIAGNOSIS — I872 Venous insufficiency (chronic) (peripheral): Secondary | ICD-10-CM | POA: Diagnosis not present

## 2018-01-09 DIAGNOSIS — E1151 Type 2 diabetes mellitus with diabetic peripheral angiopathy without gangrene: Secondary | ICD-10-CM | POA: Diagnosis not present

## 2018-01-09 DIAGNOSIS — D649 Anemia, unspecified: Secondary | ICD-10-CM | POA: Diagnosis not present

## 2018-01-09 DIAGNOSIS — Z471 Aftercare following joint replacement surgery: Secondary | ICD-10-CM | POA: Diagnosis not present

## 2018-01-09 DIAGNOSIS — F418 Other specified anxiety disorders: Secondary | ICD-10-CM | POA: Diagnosis not present

## 2018-01-09 DIAGNOSIS — M1710 Unilateral primary osteoarthritis, unspecified knee: Secondary | ICD-10-CM | POA: Diagnosis not present

## 2018-01-09 DIAGNOSIS — G43909 Migraine, unspecified, not intractable, without status migrainosus: Secondary | ICD-10-CM | POA: Diagnosis not present

## 2018-01-09 DIAGNOSIS — J45909 Unspecified asthma, uncomplicated: Secondary | ICD-10-CM | POA: Diagnosis not present

## 2018-01-09 DIAGNOSIS — M5136 Other intervertebral disc degeneration, lumbar region: Secondary | ICD-10-CM | POA: Diagnosis not present

## 2018-01-09 NOTE — Patient Outreach (Signed)
[facility]) Care Management  01/09/2018  Bethany Scott [birth year] [id number]   Case Closure:  Per KPN (Knowledge Performance Now, point of care tool) and chart review, patient hospitalized 12/27/17 -12/29/17 for PRIMARY OSTEOARTHRITIS OF LEFT HIP, status post TOTAL HIP ARTHROPLASTY ANTERIOR APPROACH (Left) on 12/27/17. No return call from patient after 3 attempts to complete transition of care call post hospital discharge.. Case closed to [place] Management services.  Barrington Ellison RN,CCM,CDE [place] Management Coordinator Office Phone [phone] Office Fax [phone]

## 2018-01-12 DIAGNOSIS — E1151 Type 2 diabetes mellitus with diabetic peripheral angiopathy without gangrene: Secondary | ICD-10-CM | POA: Diagnosis not present

## 2018-01-12 DIAGNOSIS — G43909 Migraine, unspecified, not intractable, without status migrainosus: Secondary | ICD-10-CM | POA: Diagnosis not present

## 2018-01-12 DIAGNOSIS — F418 Other specified anxiety disorders: Secondary | ICD-10-CM | POA: Diagnosis not present

## 2018-01-12 DIAGNOSIS — I872 Venous insufficiency (chronic) (peripheral): Secondary | ICD-10-CM | POA: Diagnosis not present

## 2018-01-12 DIAGNOSIS — J45909 Unspecified asthma, uncomplicated: Secondary | ICD-10-CM | POA: Diagnosis not present

## 2018-01-12 DIAGNOSIS — M5136 Other intervertebral disc degeneration, lumbar region: Secondary | ICD-10-CM | POA: Diagnosis not present

## 2018-01-12 DIAGNOSIS — Z471 Aftercare following joint replacement surgery: Secondary | ICD-10-CM | POA: Diagnosis not present

## 2018-01-12 DIAGNOSIS — M1710 Unilateral primary osteoarthritis, unspecified knee: Secondary | ICD-10-CM | POA: Diagnosis not present

## 2018-01-12 DIAGNOSIS — D649 Anemia, unspecified: Secondary | ICD-10-CM | POA: Diagnosis not present

## 2018-02-02 ENCOUNTER — Ambulatory Visit: Payer: 59

## 2018-02-08 DIAGNOSIS — M1611 Unilateral primary osteoarthritis, right hip: Secondary | ICD-10-CM | POA: Diagnosis not present

## 2018-02-08 DIAGNOSIS — Z96642 Presence of left artificial hip joint: Secondary | ICD-10-CM | POA: Diagnosis not present

## 2018-02-22 DIAGNOSIS — G5601 Carpal tunnel syndrome, right upper limb: Secondary | ICD-10-CM | POA: Diagnosis not present

## 2018-02-22 DIAGNOSIS — M259 Joint disorder, unspecified: Secondary | ICD-10-CM | POA: Diagnosis not present

## 2018-02-22 DIAGNOSIS — M7060 Trochanteric bursitis, unspecified hip: Secondary | ICD-10-CM | POA: Diagnosis not present

## 2018-02-22 DIAGNOSIS — Z5181 Encounter for therapeutic drug level monitoring: Secondary | ICD-10-CM | POA: Diagnosis not present

## 2018-03-01 ENCOUNTER — Encounter: Payer: Self-pay | Admitting: Physician Assistant

## 2018-03-01 ENCOUNTER — Ambulatory Visit (INDEPENDENT_AMBULATORY_CARE_PROVIDER_SITE_OTHER): Payer: Self-pay | Admitting: Physician Assistant

## 2018-03-01 VITALS — BP 124/78 | HR 84 | Temp 99.2°F | Resp 20 | Ht 65.0 in | Wt 200.0 lb

## 2018-03-01 DIAGNOSIS — J111 Influenza due to unidentified influenza virus with other respiratory manifestations: Secondary | ICD-10-CM

## 2018-03-01 DIAGNOSIS — R509 Fever, unspecified: Secondary | ICD-10-CM

## 2018-03-01 LAB — POCT INFLUENZA A/B
Influenza A, POC: POSITIVE — AB
Influenza B, POC: NEGATIVE

## 2018-03-01 MED ORDER — HYDROCOD POLST-CPM POLST ER 10-8 MG/5ML PO SUER: 5.0000 mL | Freq: Two times a day (BID) | ORAL | 0 refills | Status: DC | PRN

## 2018-03-01 MED ORDER — ALBUTEROL SULFATE HFA 108 (90 BASE) MCG/ACT IN AERS: 2.0000 | INHALATION_SPRAY | RESPIRATORY_TRACT | 0 refills | Status: DC | PRN

## 2018-03-01 NOTE — Patient Instructions (Signed)
Thank you for choosing [facility] for your health care needs.  You have been diagnosed with influenza.  Positive on rapid flu test, for strain A.  Recommend increase fluids; water, Gatorade, hot tea with lemon/honey, or orange juice. Rest. Use cool mist humidifier in bedroom. Prop self up with several pillows, will help with cough at night.  Follow-up with family physician or urgent care in one week if symptoms not improving, sooner with any worsening symptoms.  Influenza, Adult Influenza is also called "the flu." It is an infection in the lungs, nose, and throat (respiratory tract). It is caused by a virus. The flu causes symptoms that are similar to symptoms of a cold. It also causes a high fever and body aches. The flu spreads easily from person to person (is contagious). Getting a flu shot (influenza vaccination) every year is the best way to prevent the flu. What are the causes? This condition is caused by the influenza virus. You can get the virus by:  Breathing in droplets that are in the air from the cough or sneeze of a person who has the virus.  Touching something that has the virus on it (is contaminated) and then touching your mouth, nose, or eyes. What increases the risk? Certain things may make you more likely to get the flu. These include:  Not washing your hands often.  Having close contact with many people during cold and flu season.  Touching your mouth, eyes, or nose without first washing your hands.  Not getting a flu shot every year. You may have a higher risk for the flu, along with serious problems such as a lung infection (pneumonia), if you:  Are older than 65.  Are pregnant.  Have a weakened disease-fighting system (immune system) because of a disease or taking certain medicines.  Have a long-term (chronic) illness, such as: ? Heart, kidney, or lung disease. ? Diabetes. ? Asthma.  Have a liver disorder.  Are very overweight (morbidly  obese).  Have anemia. This is a condition that affects your red blood cells. What are the signs or symptoms? Symptoms usually begin suddenly and last 4-14 days. They may include:  Fever and chills.  Headaches, body aches, or muscle aches.  Sore throat.  Cough.  Runny or stuffy (congested) nose.  Chest discomfort.  Not wanting to eat as much as normal (poor appetite).  Weakness or feeling tired (fatigue).  Dizziness.  Feeling sick to your stomach (nauseous) or throwing up (vomiting). How is this treated? If the flu is found early, you can be treated with medicine that can help reduce how bad the illness is and how long it lasts (antiviral medicine). This may be given by mouth (orally) or through an IV tube. Taking care of yourself at home can help your symptoms get better. Your doctor may suggest:  Taking over-the-counter medicines.  Drinking plenty of fluids. The flu often goes away on its own. If you have very bad symptoms or other problems, you may be treated in a hospital. Follow these instructions at home:     Activity  Rest as needed. Get plenty of sleep.  Stay home from work or school as told by your doctor. ? Do not leave home until you do not have a fever for 24 hours without taking medicine. ? Leave home only to visit your doctor. Eating and drinking  Take an ORS (oral rehydration solution). This is a drink that is sold at pharmacies and stores.  Drink enough fluid to  keep your pee (urine) pale yellow.  Drink clear fluids in small amounts as you are able. Clear fluids include: ? Water. ? Ice chips. ? Fruit juice that has water added (diluted fruit juice). ? Low-calorie sports drinks.  Eat bland, easy-to-digest foods in small amounts as you are able. These foods include: ? Bananas. ? Applesauce. ? Rice. ? Lean meats. ? Toast. ? Crackers.  Do not eat or drink: ? Fluids that have a lot of sugar or caffeine. ? Alcohol. ? Spicy or fatty  foods. General instructions  Take over-the-counter and prescription medicines only as told by your doctor.  Use a cool mist humidifier to add moisture to the air in your home. This can make it easier for you to breathe.  Cover your mouth and nose when you cough or sneeze.  Wash your hands with soap and water often, especially after you cough or sneeze. If you cannot use soap and water, use alcohol-based hand sanitizer.  Keep all follow-up visits as told by your doctor. This is important. How is this prevented?   Get a flu shot every year. You may get the flu shot in late summer, fall, or winter. Ask your doctor when you should get your flu shot.  Avoid contact with people who are sick during fall and winter (cold and flu season). Contact a doctor if:  You get new symptoms.  You have: ? Chest pain. ? Watery poop (diarrhea). ? A fever.  Your cough gets worse.  You start to have more mucus.  You feel sick to your stomach.  You throw up. Get help right away if you:  Have shortness of breath.  Have trouble breathing.  Have skin or nails that turn a bluish color.  Have very bad pain or stiffness in your neck.  Get a sudden headache.  Get sudden pain in your face or ear.  Cannot eat or drink without throwing up. Summary  Influenza ("the flu") is an infection in the lungs, nose, and throat. It is caused by a virus.  Take over-the-counter and prescription medicines only as told by your doctor.  Getting a flu shot every year is the best way to avoid getting the flu. This information is not intended to replace advice given to you by your health care provider. Make sure you discuss any questions you have with your health care provider. Document Released: 10/21/2007 Document Revised: 06/29/2017 Document Reviewed: 06/29/2017 Elsevier Interactive Patient Education  2019 [employer].

## 2018-03-01 NOTE — Progress Notes (Signed)
Patient ID: Bethany Scott DOB: 1955/02/04 AGE: 63 y.o. MRN: [id number]   PCP: [name], MD   Chief Complaint:  Chief Complaint  Patient presents with  . Generalized Body Aches    x 3d  . Fever    x3d   . Headache    x3d  . Sore Throat    x1d  . Cough    x 1d     Subjective:    HPI:  Bethany Scott is a 63 y.o. female presents for evaluation  Chief Complaint  Patient presents with  . Generalized Body Aches    x 3d  . Fever    x3d   . Headache    x3d  . Sore Throat    x1d  . Cough    x 25d    63 year old female presents with four day history of flu-like symptoms. Began Sunday evening 02/26/2018. Began with headache and body aches. Went to bed. Following morning woke up with fever (on and off past several days, Tmax 102F), chills, sweats, nasal congestion, watery eyes, sore throat, coarse cough, and chest congestion. Associated anorexia. Has been staying hydrated. Has been using previously prescribed eye drops and nasal sprays. Has taken OTC Aleve (last dose two hours ago), Allegra, and Mucinex. Denies dizziness/lightheadedness, ear pain, sinus pain, neck pain, chest pain, SOB, wheezing, vomiting, diarrhea, abdominal pain, rash.  Patient's granddaughter diagnosed with influenza, via positive rapid flu test performed at pediatrician's office, on Sunday 02/26/2018. Patient's granddaughter's symptoms began the previous day, Saturday. Patient spent all day Saturday with her granddaughter, helping to take care of her while she was ill.  Patient with recent left total hip arthroplasty on 12/27/2017 by Dr. [name] MD. Reports left hip pain with current illness.  Patient was cheduled for colonoscopy in two days, this Friday 03/03/2018. Called and cancelled.  Patient is a former cigarette smoker. 1ppd x 5 years. Patient denies COPD or emphysema diagnosis, however has a QVAR inhaler at home. Does not regularly use.  A limited review of symptoms was performed,  pertinent positives and negatives as mentioned in HPI.  The following portions of the patient's history were reviewed and updated as appropriate: allergies, current medications and past medical history.  Patient Active Problem List   Diagnosis Date Noted  . Status post total hip replacement, 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  . Status post total hip replacement, right 09/27/2017  . Chronic cystitis 02/08/2017  . Hematuria, microscopic 02/08/2017  . History of nephrolithiasis 02/08/2017  . Chronic venous insufficiency 01/06/2017  . Varicose veins of leg with pain, right 12/31/2016  . GERD (gastroesophageal reflux disease) 03/01/2016  . 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 Hip arthritis 05/29/2015  . Degenerative joint disease (DJD) of hip 07/24/2014  . DJD (degenerative joint disease) of knee 06/17/2014  . Greater trochanteric bursitis of both hips 06/17/2014  . Degenerative joint disease of sacroiliac joint 06/17/2014  . Facet syndrome, lumbar 06/17/2014  . DDD (degenerative disc disease), lumbar 06/17/2014  . Low serum vitamin D 06/08/2013  . Sinusitis, acute, maxillary 02/08/2012  . Migraines 02/08/2012  . Depression with anxiety 02/08/2012  . Essential hypertension, benign 02/08/2012  . Pure hypercholesterolemia 02/08/2012  . Diabetes mellitus type 2 in obese ([place]) 02/08/2012    Allergies  Allergen Reactions  . Cephalexin Anaphylaxis  . Cephalosporins Anaphylaxis  . Penicillins Anaphylaxis and Other (See Comments)    Has patient had a PCN reaction causing immediate rash, facial/tongue/throat swelling, SOB or lightheadedness  with hypotension: Yes Has patient had a PCN reaction causing severe rash involving mucus membranes or skin necrosis: No Has patient had a PCN reaction that required hospitalization Yes Has patient had a PCN reaction occurring within the last 10 years: No If all of the above answers are "NO", then may proceed with Cephalosporin use.     Current Outpatient  Medications on File Prior to Visit  Medication Sig Dispense Refill  . beclomethasone (QVAR) 80 MCG/ACT inhaler Inhale 2 puffs into the lungs daily.     . Betamethasone Valerate 0.12 % foam     . cetirizine (ZYRTEC) 10 MG tablet Take 10 mg by mouth 2 (two) times daily.     . clindamycin (CLEOCIN T) 1 % lotion   4  . diclofenac (FLECTOR) 1.3 % PTCH Apply one patch or a portion of patch to painful area of skin twice per day if tolerated (Patient taking differently: Place 1 patch onto the skin 2 (two) times daily as needed (pain). Apply one patch or a portion of patch to painful area of skin twice per day if tolerated) 60 patch 0  . diclofenac sodium (VOLTAREN) 1 % GEL Apply 1 application topically daily as needed for pain.  0  . doxycycline (MONODOX) 100 MG capsule   1  . DULoxetine (CYMBALTA) 60 MG capsule Take 1 capsule (60 mg total) by mouth 2 (two) times daily. 180 capsule 1  . HYDROcodone-acetaminophen (NORCO) 10-325 MG tablet Take 1 tablet by mouth every 6 (six) hours as needed.    [name] ketoconazole (NIZORAL) 2 % shampoo   5  . mometasone (NASONEX) 50 MCG/ACT nasal spray Place 2 sprays into the nose daily. (Patient taking differently: Place 1 spray into the nose 2 (two) times daily. ) 17 g 12  . montelukast (SINGULAIR) 10 MG tablet Take 1 tablet (10 mg total) by mouth daily. 90 tablet 3  . Olopatadine HCl 0.2 % SOLN Apply 1 drop to eye daily. 2.5 mL 11  . pantoprazole (PROTONIX) 40 MG tablet TAKE 1 TABLET BY MOUTH TWO TIMES DAILY 180 tablet 3  . PREVIDENT 5000 DRY MOUTH 1.1 % GEL dental gel Place 1 application onto teeth 2 (two) times daily.  5  . phenazopyridine (PYRIDIUM) 97 MG tablet Take 97-194 mg by mouth daily as needed for pain.    . promethazine (PHENERGAN) 25 MG tablet Take 1 tablet (25 mg total) by mouth every 8 (eight) hours as needed for nausea or vomiting. (Patient not taking: Reported on 03/01/2018) 20 tablet 0   No current facility-administered medications on file prior to visit.         Objective:   Vitals:   03/01/18 1227  BP: 124/78  Pulse: 84  Resp: 20  Temp: 99.2 F (37.3 C)  SpO2: 96%     Wt Readings from Last 3 Encounters:  03/01/18 200 lb (90.7 kg)  12/27/17 196 lb (88.9 kg)  09/27/17 191 lb 9.6 oz (86.9 kg)    Physical Exam:   General Appearance:  Patient sitting comfortably on examination table. Conversational. Kermit Balo self-historian. In no acute distress. 99.46F temperature.  Head:  Normocephalic, without obvious abnormality, atraumatic  Eyes:  PERRL, conjunctiva/corneas clear, EOM's intact  Ears:  Bilateral ear canals WNL. No erythema or edema. No discharge/drainage. Bilateral TMs WNL. No erythema, injection, or serous effusion. No scar tissue.  Nose: Nares normal, septum midline. Nasal mucosa with bilateral edema. Clear rhinorrhea. Nasally sounding voice. No sinus tenderness with percussion/palpation.  Throat: Lips, mucosa,  and tongue normal; teeth and gums normal. Throat reveals no erythema. Tonsils are surgically absent.  Neck: Supple, symmetrical, trachea midline, no adenopathy  Lungs:   Clear to auscultation bilaterally, respirations unlabored. Good aeration. No wheezing, rales, rhonchi, or crackles. Harsh bronchitic cough during examination.  Heart:  Regular rate and rhythm, S1 and S2 normal, no murmur, rub, or gallop  Extremities: Extremities normal, atraumatic, no cyanosis or edema  Pulses: 2+ and symmetric  Skin: Skin color, texture, turgor normal, no rashes or lesions. Patient warm to the touch. Scant diaphoresis.  Lymph nodes: Cervical, supraclavicular, and axillary nodes normal  Neurologic: Normal    Assessment & Plan:    Exam findings, diagnosis etiology and medication use and indications reviewed with patient. Follow-Up and discharge instructions provided. No emergent/urgent issues found on exam.  Patient education was provided.   Patient verbalized understanding of information provided and agrees with plan of care (POC), all  questions answered. The patient is advised to call or return to clinic if condition does not see an improvement in symptoms, or to seek the care of the closest emergency department if condition worsens with the below plan.    1. Influenza - chlorpheniramine-HYDROcodone (TUSSIONEX PENNKINETIC ER) 10-8 MG/5ML SUER; Take 5 mLs by mouth every 12 (twelve) hours as needed for cough.  Dispense: 115 mL; Refill: 0 - albuterol (PROVENTIL HFA;VENTOLIN HFA) 108 (90 Base) MCG/ACT inhaler; Inhale 2 puffs into the lungs every 4 (four) hours as needed.  Dispense: 1 Inhaler; Refill: 0  2. Fever, unspecified fever cause - POCT Influenza A/B  Patient with four day history of flu-like symptoms. Positive rapid flu test performed in clinic today, strain A. Patient with slightly elevated temp in office today and 96% pulse ox. Otherwise, VSS, in no acute distress, clear lung sounds. Patient with uncomplicated influenza. No antiviral prescribed, patient >48 hour window. Patient requested Tussionex cough syrup, has worked well in the past (advised patient not to take Vicodin as well, has some leftover, prescribed post-op). Also prescribed albuterol inhaler, for cough. Advised rest, increase fluids, OTC antipyretics.  Discussed with patient possible flu complications; patient will monitor herself closely. Discussed with patient contagiousness; provided one week off from work note. Advised patient follow-up with PCP or urgent care if symptoms do not improve in one week, sooner with any worsening symptoms.   Darlin Priestly, MHS, PA-C Montey Hora, MHS, PA-C Advanced Practice Provider [facility]  [address], [facility], [place], [place] [address] (p):  [phone] [email or web address]

## 2018-03-03 ENCOUNTER — Ambulatory Visit: Admission: RE | Admit: 2018-03-03 | Payer: 59 | Source: Ambulatory Visit | Admitting: Unknown Physician Specialty

## 2018-03-03 ENCOUNTER — Encounter: Admission: RE | Payer: Self-pay | Source: Ambulatory Visit

## 2018-03-03 SURGERY — COLONOSCOPY WITH PROPOFOL
Anesthesia: General

## 2018-03-06 ENCOUNTER — Telehealth: Payer: Self-pay | Admitting: Emergency Medicine

## 2018-03-06 NOTE — Telephone Encounter (Signed)
Spoke with patient per [facility] provider needs further work up to seek PCP,ED or Urgent care. Patient acknowledge understanding

## 2018-03-06 NOTE — Telephone Encounter (Signed)
If patient is not doing any better, do believe a CXR may be warranted. Patient can call family physician, can try and be seen today. If no available appointment, recommend patient go to an urgent care (such as [facility] urgent care or [facility] urgent care in [place]) or to [facility] ED for further evaluation and treatment.  Thanks, SFS PA-C

## 2018-03-06 NOTE — Telephone Encounter (Signed)
Left message followup call from [facility] visit

## 2018-03-06 NOTE — Telephone Encounter (Signed)
Patient informed me that she is not doing any better. Having a lot of wheezing and coughing and has been taking cough syrup q12h. She asked if you think she has pneumonia Thought that we were able to send for CXR I informed her we are unable to do this. So she is wondering what she should do. Asked if you listen to her chest could you tell. Please advise

## 2018-03-14 DIAGNOSIS — J209 Acute bronchitis, unspecified: Secondary | ICD-10-CM | POA: Diagnosis not present

## 2018-03-14 DIAGNOSIS — J069 Acute upper respiratory infection, unspecified: Secondary | ICD-10-CM | POA: Diagnosis not present

## 2018-03-14 DIAGNOSIS — R05 Cough: Secondary | ICD-10-CM | POA: Diagnosis not present

## 2018-03-21 DIAGNOSIS — M7060 Trochanteric bursitis, unspecified hip: Secondary | ICD-10-CM | POA: Diagnosis not present

## 2018-03-21 DIAGNOSIS — G894 Chronic pain syndrome: Secondary | ICD-10-CM | POA: Diagnosis not present

## 2018-03-21 DIAGNOSIS — M259 Joint disorder, unspecified: Secondary | ICD-10-CM | POA: Diagnosis not present

## 2018-04-05 ENCOUNTER — Ambulatory Visit (INDEPENDENT_AMBULATORY_CARE_PROVIDER_SITE_OTHER): Payer: Self-pay | Admitting: Physician Assistant

## 2018-04-05 ENCOUNTER — Telehealth: Payer: Commercial Managed Care - PPO | Admitting: Nurse Practitioner

## 2018-04-05 VITALS — BP 150/78 | HR 81 | Temp 98.4°F | Resp 14 | Wt 202.0 lb

## 2018-04-05 DIAGNOSIS — R300 Dysuria: Secondary | ICD-10-CM

## 2018-04-05 DIAGNOSIS — N3000 Acute cystitis without hematuria: Secondary | ICD-10-CM

## 2018-04-05 LAB — POC URINALSYSI DIPSTICK (AUTOMATED)
Bilirubin, UA: POSITIVE
Blood, UA: NEGATIVE
Glucose, UA: NEGATIVE
Ketones, UA: NEGATIVE
Leukocytes, UA: NEGATIVE
Nitrite, UA: POSITIVE
Protein, UA: POSITIVE — AB
pH, UA: 5 (ref 5.0–8.0)

## 2018-04-05 MED ORDER — NITROFURANTOIN MONOHYD MACRO 100 MG PO CAPS: 100.0000 mg | ORAL_CAPSULE | Freq: Two times a day (BID) | ORAL | 0 refills | Status: AC

## 2018-04-05 MED ORDER — PHENAZOPYRIDINE HCL 200 MG PO TABS: 200.0000 mg | ORAL_TABLET | Freq: Three times a day (TID) | ORAL | 0 refills | Status: DC | PRN

## 2018-04-05 NOTE — Patient Instructions (Addendum)
Thank you for choosing [facility] for your health care needs.  You have been diagnosed with dysuria (burning with urination).  Possible you have a urinary tract infection (a UTI).  Take antibiotic as prescribed:  May use Pyridium for next 1-2 days for symptom relief.  Increase fluids; recommend water or cranberry juice. Empty bladder frequently.  Take over the counter Tylenol for pain/discomfort.  Follow-up with family physician or urgent care in 2-3 days if symptoms not improving. Will need a repeat urinalysis and a urine culture. Sooner with any worsening symptoms.  May wish to establish care with a new urologist and/or discuss management of frequent/recurrent UTIs with family physician.  Hope you feel better soon!  Urinary Tract Infection, Adult A urinary tract infection (UTI) is an infection of any part of the urinary tract. The urinary tract includes:  The kidneys.  The ureters.  The bladder.  The urethra. These organs make, store, and get rid of pee (urine) in the body. What are the causes? This is caused by germs (bacteria) in your genital area. These germs grow and cause swelling (inflammation) of your urinary tract. What increases the risk? You are more likely to develop this condition if:  You have a small, thin tube (catheter) to drain pee.  You cannot control when you pee or poop (incontinence).  You are female, and: ? You use these methods to prevent pregnancy: ? A medicine that kills sperm (spermicide). ? A device that blocks sperm (diaphragm). ? You have low levels of a female hormone (estrogen). ? You are pregnant.  You have genes that add to your risk.  You are sexually active.  You take antibiotic medicines.  You have trouble peeing because of: ? A prostate that is bigger than normal, if you are female. ? A blockage in the part of your body that drains pee from the bladder (urethra). ? A kidney stone. ? A nerve condition that affects your  bladder (neurogenic bladder). ? Not getting enough to drink. ? Not peeing often enough.  You have other conditions, such as: ? Diabetes. ? A weak disease-fighting system (immune system). ? Sickle cell disease. ? Gout. ? Injury of the spine. What are the signs or symptoms? Symptoms of this condition include:  Needing to pee right away (urgently).  Peeing often.  Peeing small amounts often.  Pain or burning when peeing.  Blood in the pee.  Pee that smells bad or not like normal.  Trouble peeing.  Pee that is cloudy.  Fluid coming from the vagina, if you are female.  Pain in the belly or lower back. Other symptoms include:  Throwing up (vomiting).  No urge to eat.  Feeling mixed up (confused).  Being tired and grouchy (irritable).  A fever.  Watery poop (diarrhea). How is this treated? This condition may be treated with:  Antibiotic medicine.  Other medicines.  Drinking enough water. Follow these instructions at home:  Medicines  Take over-the-counter and prescription medicines only as told by your doctor.  If you were prescribed an antibiotic medicine, take it as told by your doctor. Do not stop taking it even if you start to feel better. General instructions  Make sure you: ? Pee until your bladder is empty. ? Do not hold pee for a long time. ? Empty your bladder after sex. ? Wipe from front to back after pooping if you are a female. Use each tissue one time when you wipe.  Drink enough fluid to keep your pee  pale yellow.  Keep all follow-up visits as told by your doctor. This is important. Contact a doctor if:  You do not get better after 1-2 days.  Your symptoms go away and then come back. Get help right away if:  You have very bad back pain.  You have very bad pain in your lower belly.  You have a fever.  You are sick to your stomach (nauseous).  You are throwing up. Summary  A urinary tract infection (UTI) is an infection of  any part of the urinary tract.  This condition is caused by germs in your genital area.  There are many risk factors for a UTI. These include having a small, thin tube to drain pee and not being able to control when you pee or poop.  Treatment includes antibiotic medicines for germs.  Drink enough fluid to keep your pee pale yellow. This information is not intended to replace advice given to you by your health care provider. Make sure you discuss any questions you have with your health care provider. Document Released: 06/30/2007 Document Revised: 07/21/2017 Document Reviewed: 07/21/2017 Elsevier Interactive Patient Education  2019 [employer].

## 2018-04-05 NOTE — Progress Notes (Addendum)
Patient ID: KOREN PLYLER DOB: 10-04-1955 AGE: 63 y.o. MRN: [id number]   PCP: [name], MD   Chief Complaint:  Chief Complaint  Patient presents with  . burning with urination and blood in urine    x3 days (Pyridium)     Subjective:    HPI:  LETRICIA KRINSKY is a 63 y.o. female presents for evaluation  Chief Complaint  Patient presents with  . burning with urination and blood in urine    x3 days (Pyridium)    63 year old female presents to [facility] with three day history of dysuria. Waxes and wanes in severity. Better yesterday. Severe this morning. Associated increased urinary frequency, urinary urgency, and suprapubic pressure. Has taken OTC Azo and Tylenol with minimal relief. Associated malaise; patient states due to nasal congestion, sinus pressure/congestion and PND (patient with seasonal allergies). Denies fever, chills, sweats, flank pain, nausea/vomiting, abdominal pain (other than pelvic pressure), diarrhea. Patient suspected she saw blood in her urine this morning. Patient with mild stress incontinence; typically with coughing. Denies worsening incontinence. Denies vaginal rash, discharge, pruritis. Patient states typically her UTIs are caused from having sexual intercourse. Patient and her husband separated for a few months (which is why she hasn't had a UTI for awhile). Recently reconciled. Patient denies concern for STI. No concern for pregnancy; patient s/p hysterectomy.  Patient with frequent history of UTIs since 63 years old. Underwent ureteral stretching in her teenage years to help decrease frequency of UTIs. Patient used to be regularly seen by Dr. Jacqlyn Larsen, urologist. Dr. Jacqlyn Larsen moved to [facility] [place] a few months ago. Patient's last UTI a few months ago. Believes she was prescribed Cipro. Patient states one year ago Dr. Jacqlyn Larsen put her on 51-months of Macrobid; helped a little. Patient states sometime in 2019 she underwent a bladder emptying study; told she had  incomplete bladder emptying. Patient estimates she has 10 UTIs per year. Patient with no history of ureteral stones. Patient is a former cigarette smoker; quit 32 years ago. Patient's last BMP on 12/29/2017, three months ago, revealed BUN 10, Cr 0.50, and GFR >60.  Patient prescribed course of Clarithromycin (Biaxin) on 03/14/2018 for sinus infection at local urgent care; complication s/p influenza (diagnosed by [facility] on 03/01/2018)  A limited review of symptoms was performed, pertinent positives and negatives as mentioned in HPI.  The following portions of the patient's history were reviewed and updated as appropriate: allergies, current medications and past medical history.  Patient Active Problem List   Diagnosis Date Noted  . Status post total hip replacement, 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  . Status post total hip replacement, right 09/27/2017  . Incomplete emptying of bladder 02/16/2017  . Chronic cystitis 02/08/2017  . Hematuria, microscopic 02/08/2017  . History of nephrolithiasis 02/08/2017  . Pelvic pain in female 02/08/2017  . Chronic venous insufficiency 01/06/2017  . Varicose veins of leg with pain, right 12/31/2016  . GERD (gastroesophageal reflux disease) 03/01/2016  . 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 Hip arthritis 05/29/2015  . Degenerative joint disease (DJD) of hip 07/24/2014  . DJD (degenerative joint disease) of knee 06/17/2014  . Greater trochanteric bursitis of both hips 06/17/2014  . Degenerative joint disease of sacroiliac joint 06/17/2014  . Facet syndrome, lumbar 06/17/2014  . DDD (degenerative disc disease), lumbar 06/17/2014  . Low serum vitamin D 06/08/2013  . Sinusitis, acute, maxillary 02/08/2012  . Migraines 02/08/2012  . Depression with anxiety 02/08/2012  . Essential hypertension, benign 02/08/2012  . Pure hypercholesterolemia  02/08/2012  . Diabetes mellitus type 2 in obese ([place]) 02/08/2012    Allergies  Allergen Reactions  . Cephalexin  Anaphylaxis  . Cephalosporins Anaphylaxis  . Penicillins Anaphylaxis and Other (See Comments)    Has patient had a PCN reaction causing immediate rash, facial/tongue/throat swelling, SOB or lightheadedness with hypotension: Yes Has patient had a PCN reaction causing severe rash involving mucus membranes or skin necrosis: No Has patient had a PCN reaction that required hospitalization Yes Has patient had a PCN reaction occurring within the last 10 years: No If all of the above answers are "NO", then may proceed with Cephalosporin use.     Current Outpatient Medications on File Prior to Visit  Medication Sig Dispense Refill  . cetirizine (ZYRTEC) 10 MG tablet Take 10 mg by mouth 2 (two) times daily.     . clindamycin (CLEOCIN T) 1 % lotion   4  . diclofenac sodium (VOLTAREN) 1 % GEL Apply 1 application topically daily as needed for pain.  0  . DULoxetine (CYMBALTA) 60 MG capsule Take 1 capsule (60 mg total) by mouth 2 (two) times daily. 180 capsule 1  . ketoconazole (NIZORAL) 2 % shampoo   5  . montelukast (SINGULAIR) 10 MG tablet Take 1 tablet (10 mg total) by mouth daily. 90 tablet 3  . Olopatadine HCl 0.2 % SOLN Apply 1 drop to eye daily. 2.5 mL 11  . pantoprazole (PROTONIX) 40 MG tablet TAKE 1 TABLET BY MOUTH TWO TIMES DAILY 180 tablet 3  . phenazopyridine (PYRIDIUM) 97 MG tablet Take 97-194 mg by mouth daily as needed for pain.    . beclomethasone (QVAR) 80 MCG/ACT inhaler Inhale 2 puffs into the lungs daily.     . Betamethasone Valerate 0.12 % foam     . doxycycline (MONODOX) 100 MG capsule   1  . HYDROcodone-acetaminophen (NORCO) 10-325 MG tablet Take 1 tablet by mouth every 6 (six) hours as needed.    [name] PREVIDENT 5000 DRY MOUTH 1.1 % GEL dental gel Place 1 application onto teeth 2 (two) times daily.  5   No current facility-administered medications on file prior to visit.        Objective:   Vitals:   04/05/18 1623  BP: (!) 150/78  Pulse: 81  Resp: 14  Temp: 98.4 F (36.9  C)  SpO2: 96%     Wt Readings from Last 3 Encounters:  04/05/18 202 lb (91.6 kg)  03/01/18 200 lb (90.7 kg)  12/27/17 196 lb (88.9 kg)    Physical Exam:   General Appearance:  Patient sitting comfortably on examination table. Conversational. Kermit Balo self-historian. In no acute distress. Afebrile.   Head:  Normocephalic, without obvious abnormality, atraumatic  Lungs:   Clear to auscultation bilaterally, respirations unlabored. Good aeration. No rales, rhonchi, crackles or wheezing.  Heart:  Regular rate and rhythm, S1 and S2 normal, no murmur, rub, or gallop  Abdomen:   Normal to inspection. Normoactive bowel sounds. Mild suprapubic tenderness with palpation. No guarding, rigidity or rebound tenderness. No palpable organomegaly. No CVA tenderness with percussion bilaterally.  Extremities: Extremities normal, atraumatic, no cyanosis or edema  Pulses: 2+ and symmetric  Skin: Skin color, texture, turgor normal, no rashes or lesions  Lymph nodes: Cervical, supraclavicular, and axillary nodes normal  Neurologic: Normal    Assessment & Plan:    Exam findings, diagnosis etiology and medication use and indications reviewed with patient. Follow-Up and discharge instructions provided. No emergent/urgent issues found on exam.  Patient education  was provided.   Patient verbalized understanding of information provided and agrees with plan of care (POC), all questions answered. The patient is advised to call or return to clinic if condition does not see an improvement in symptoms, or to seek the care of the closest emergency department if condition worsens with the below plan.    1. Burning with urination - POCT Urinalysis Dipstick (Automated)  UA reveals SG <1.005, +protein, +nitrites. Negative blood. Negative leukocytes. Color orange.  Patient with 3 day history of dysuria. UA reveals positive nitrites (may be from Azo, or may be indication of infection). VSS, afebrile, in no acute distress,  benign abdominal exam, no CVA tenderness. Low suspicion for pyelonephritis. Patient with history of frequent UTIs. Has responded well to [place] in the past. Prescribed patient 5 day course of Macrobid. Advised further evaluation by PCP or urgent care if no significant improvement in 2-3 days, sooner with any worsening symptoms; at that time, repeat UA with C&S would be warranted. Discussed establishing care with new urologist. May wish to be given antibiotic prescription to be used preventatively for post-coital UTIs. Patient agreed with plan.   Darlin Priestly, MHS, PA-C Montey Hora, MHS, PA-C Advanced Practice Provider [facility]  [address], [facility], [place], [place] [address] (p):  [phone] [email or web address]

## 2018-04-05 NOTE — Progress Notes (Signed)
  Based on what you shared with me it looks like you have uti or pyeloneohritis due to accompanying back pain.,that should be evaluated in a face to face office visit. You will need to see your PCP for urinalysis and urine culture.  NOTE: If you entered your credit card information for this eVisit, you will not be charged. You may see a "hold" on your card for the $30 but that hold will drop off and you will not have a charge processed.  If you are having a true medical emergency please call 911.  If you need an urgent face to face visit, [place] has four urgent care centers for your convenience.  If you need care fast and have a high deductible or no insurance consider:   [email or web address] to reserve your spot online an avoid wait times  [facility] [address] [place], [place] [address] 8 am to 8 pm Monday-Friday 10 am to 4 pm Saturday-Sunday *Across the street from [employer]  [place], [address] 8 am to 5 pm Monday-Friday * In the [facility] on the [facility]   The following sites will take your  insurance:  . [facility] [place] a Provider at this Location  [address] [place], [place] [address] . 10 am to 8 pm Monday-Friday . 12 pm to 8 pm Saturday-Sunday   . [facility] [place] a Provider at this Location  [address] [place], [place], [place] [address] . 8 am to 8 pm Monday-Friday . 9 am to 6 pm Saturday . 11 am to 6 pm Sunday   . [facility] at [place] Get Driving Directions  [address] [place], [place] [address] . 8 am to 8 pm Monday-Friday . 8 am to 4 pm Saturday-Sunday   Your e-visit answers were reviewed by a board certified advanced clinical practitioner to complete your  personal care plan.  5 minutes spent reviewing and documenting in chart.

## 2018-04-12 DIAGNOSIS — M7062 Trochanteric bursitis, left hip: Secondary | ICD-10-CM | POA: Diagnosis not present

## 2018-04-12 DIAGNOSIS — M25552 Pain in left hip: Secondary | ICD-10-CM | POA: Diagnosis not present

## 2018-04-12 DIAGNOSIS — Z96642 Presence of left artificial hip joint: Secondary | ICD-10-CM | POA: Diagnosis not present

## 2018-04-17 DIAGNOSIS — Z5181 Encounter for therapeutic drug level monitoring: Secondary | ICD-10-CM | POA: Diagnosis not present

## 2018-04-17 DIAGNOSIS — M7060 Trochanteric bursitis, unspecified hip: Secondary | ICD-10-CM | POA: Diagnosis not present

## 2018-04-17 DIAGNOSIS — G5601 Carpal tunnel syndrome, right upper limb: Secondary | ICD-10-CM | POA: Diagnosis not present

## 2018-04-17 DIAGNOSIS — M259 Joint disorder, unspecified: Secondary | ICD-10-CM | POA: Diagnosis not present

## 2018-04-28 DIAGNOSIS — E7800 Pure hypercholesterolemia, unspecified: Secondary | ICD-10-CM | POA: Diagnosis not present

## 2018-04-28 DIAGNOSIS — Z7251 High risk heterosexual behavior: Secondary | ICD-10-CM | POA: Diagnosis not present

## 2018-04-28 DIAGNOSIS — G43709 Chronic migraine without aura, not intractable, without status migrainosus: Secondary | ICD-10-CM | POA: Diagnosis not present

## 2018-04-28 DIAGNOSIS — F418 Other specified anxiety disorders: Secondary | ICD-10-CM | POA: Diagnosis not present

## 2018-04-28 DIAGNOSIS — Z9884 Bariatric surgery status: Secondary | ICD-10-CM | POA: Diagnosis not present

## 2018-04-28 DIAGNOSIS — Z131 Encounter for screening for diabetes mellitus: Secondary | ICD-10-CM | POA: Diagnosis not present

## 2018-04-28 DIAGNOSIS — R7302 Impaired glucose tolerance (oral): Secondary | ICD-10-CM | POA: Diagnosis not present

## 2018-05-15 DIAGNOSIS — G5601 Carpal tunnel syndrome, right upper limb: Secondary | ICD-10-CM | POA: Diagnosis not present

## 2018-05-15 DIAGNOSIS — M7060 Trochanteric bursitis, unspecified hip: Secondary | ICD-10-CM | POA: Diagnosis not present

## 2018-05-15 DIAGNOSIS — Z5181 Encounter for therapeutic drug level monitoring: Secondary | ICD-10-CM | POA: Diagnosis not present

## 2018-05-15 DIAGNOSIS — M259 Joint disorder, unspecified: Secondary | ICD-10-CM | POA: Diagnosis not present

## 2018-05-31 DIAGNOSIS — M25552 Pain in left hip: Secondary | ICD-10-CM | POA: Diagnosis not present

## 2018-05-31 DIAGNOSIS — M7062 Trochanteric bursitis, left hip: Secondary | ICD-10-CM | POA: Diagnosis not present

## 2018-06-12 DIAGNOSIS — M792 Neuralgia and neuritis, unspecified: Secondary | ICD-10-CM | POA: Diagnosis not present

## 2018-06-12 DIAGNOSIS — M4807 Spinal stenosis, lumbosacral region: Secondary | ICD-10-CM | POA: Diagnosis not present

## 2018-06-12 DIAGNOSIS — M47897 Other spondylosis, lumbosacral region: Secondary | ICD-10-CM | POA: Diagnosis not present

## 2018-06-12 DIAGNOSIS — M7060 Trochanteric bursitis, unspecified hip: Secondary | ICD-10-CM | POA: Diagnosis not present

## 2018-06-12 DIAGNOSIS — E663 Overweight: Secondary | ICD-10-CM | POA: Diagnosis not present

## 2018-06-12 DIAGNOSIS — M4726 Other spondylosis with radiculopathy, lumbar region: Secondary | ICD-10-CM | POA: Diagnosis not present

## 2018-06-12 DIAGNOSIS — G5601 Carpal tunnel syndrome, right upper limb: Secondary | ICD-10-CM | POA: Diagnosis not present

## 2018-06-12 DIAGNOSIS — G8929 Other chronic pain: Secondary | ICD-10-CM | POA: Diagnosis not present

## 2018-06-12 DIAGNOSIS — M48062 Spinal stenosis, lumbar region with neurogenic claudication: Secondary | ICD-10-CM | POA: Diagnosis not present

## 2018-06-12 DIAGNOSIS — Z5181 Encounter for therapeutic drug level monitoring: Secondary | ICD-10-CM | POA: Diagnosis not present

## 2018-06-12 DIAGNOSIS — R51 Headache: Secondary | ICD-10-CM | POA: Diagnosis not present

## 2018-06-12 DIAGNOSIS — M259 Joint disorder, unspecified: Secondary | ICD-10-CM | POA: Diagnosis not present

## 2018-06-16 DIAGNOSIS — M76892 Other specified enthesopathies of left lower limb, excluding foot: Secondary | ICD-10-CM | POA: Diagnosis not present

## 2018-06-16 DIAGNOSIS — M25552 Pain in left hip: Secondary | ICD-10-CM | POA: Diagnosis not present

## 2018-06-16 DIAGNOSIS — M7062 Trochanteric bursitis, left hip: Secondary | ICD-10-CM | POA: Diagnosis not present

## 2018-06-16 DIAGNOSIS — Z96642 Presence of left artificial hip joint: Secondary | ICD-10-CM | POA: Diagnosis not present

## 2018-06-16 DIAGNOSIS — M62838 Other muscle spasm: Secondary | ICD-10-CM | POA: Diagnosis not present

## 2018-07-11 DIAGNOSIS — Z5181 Encounter for therapeutic drug level monitoring: Secondary | ICD-10-CM | POA: Diagnosis not present

## 2018-07-11 DIAGNOSIS — M259 Joint disorder, unspecified: Secondary | ICD-10-CM | POA: Diagnosis not present

## 2018-07-11 DIAGNOSIS — M7060 Trochanteric bursitis, unspecified hip: Secondary | ICD-10-CM | POA: Diagnosis not present

## 2018-07-11 DIAGNOSIS — G5601 Carpal tunnel syndrome, right upper limb: Secondary | ICD-10-CM | POA: Diagnosis not present

## 2018-07-21 DIAGNOSIS — M76892 Other specified enthesopathies of left lower limb, excluding foot: Secondary | ICD-10-CM | POA: Diagnosis not present

## 2018-07-21 DIAGNOSIS — M62838 Other muscle spasm: Secondary | ICD-10-CM | POA: Diagnosis not present

## 2018-07-21 DIAGNOSIS — M25552 Pain in left hip: Secondary | ICD-10-CM | POA: Diagnosis not present

## 2018-07-21 DIAGNOSIS — M7062 Trochanteric bursitis, left hip: Secondary | ICD-10-CM | POA: Diagnosis not present

## 2018-08-08 DIAGNOSIS — G5601 Carpal tunnel syndrome, right upper limb: Secondary | ICD-10-CM | POA: Diagnosis not present

## 2018-08-08 DIAGNOSIS — Z5181 Encounter for therapeutic drug level monitoring: Secondary | ICD-10-CM | POA: Diagnosis not present

## 2018-08-08 DIAGNOSIS — M7060 Trochanteric bursitis, unspecified hip: Secondary | ICD-10-CM | POA: Diagnosis not present

## 2018-08-08 DIAGNOSIS — M259 Joint disorder, unspecified: Secondary | ICD-10-CM | POA: Diagnosis not present

## 2018-08-10 ENCOUNTER — Ambulatory Visit (INDEPENDENT_AMBULATORY_CARE_PROVIDER_SITE_OTHER): Payer: Self-pay | Admitting: Physician Assistant

## 2018-08-10 ENCOUNTER — Encounter: Payer: Self-pay | Admitting: Physician Assistant

## 2018-08-10 ENCOUNTER — Other Ambulatory Visit: Payer: Self-pay

## 2018-08-10 ENCOUNTER — Other Ambulatory Visit: Payer: Self-pay | Admitting: Physician Assistant

## 2018-08-10 VITALS — BP 98/64 | HR 97 | Temp 98.4°F | Resp 18 | Wt 201.8 lb

## 2018-08-10 DIAGNOSIS — R05 Cough: Secondary | ICD-10-CM

## 2018-08-10 DIAGNOSIS — H6983 Other specified disorders of Eustachian tube, bilateral: Secondary | ICD-10-CM

## 2018-08-10 DIAGNOSIS — R059 Cough, unspecified: Secondary | ICD-10-CM

## 2018-08-10 DIAGNOSIS — J0100 Acute maxillary sinusitis, unspecified: Secondary | ICD-10-CM

## 2018-08-10 DIAGNOSIS — B379 Candidiasis, unspecified: Secondary | ICD-10-CM

## 2018-08-10 MED ORDER — FLUCONAZOLE 150 MG PO TABS: 150.0000 mg | ORAL_TABLET | Freq: Once | ORAL | 0 refills | Status: AC

## 2018-08-10 MED ORDER — AZELASTINE-FLUTICASONE 137-50 MCG/ACT NA SUSP: 2.0000 | Freq: Two times a day (BID) | NASAL | 0 refills | Status: DC

## 2018-08-10 MED ORDER — PREDNISONE 10 MG (21) PO TBPK: ORAL_TABLET | ORAL | 0 refills | Status: DC

## 2018-08-10 MED ORDER — CLARITHROMYCIN 250 MG PO TABS: 250.0000 mg | ORAL_TABLET | Freq: Two times a day (BID) | ORAL | 0 refills | Status: AC

## 2018-08-10 NOTE — Progress Notes (Signed)
Patient ID: Bethany Scott DOB: Dec 24, 1955 AGE: 63 y.o. MRN: [id number]   PCP: [name], MD   Chief Complaint:  Chief Complaint  Patient presents with  . sinus pressure and drainage    x1 week (singulair, nasonex, inhaler)     Subjective:    HPI:  Bethany Scott is a 63 y.o. female presents for evaluation  Chief Complaint  Patient presents with  . sinus pressure and drainage    x1 week (singulair, nasonex, inhaler)    63 year old female presents to [facility] with one week history of URI symptoms. Reports nasal congestion, rhinorrhea, postnasal drip, irritated throat, bilateral maxillary sinus pressure/congestion, frontal headache, bilateral tinnitus, and cough. Tinnitus severe yesterday; lessened today. Bilateral ear pressure with popping/crackling. Associated nausea and lack of appetite; suspects due to PND. Sinus pressure worsening. Tender to palpation. Takes Singulair, uses Nasonex nasal spray, and uses QVAR inhaler bid for management of allergies, recurrent sinusitis, and asthma. Diagnosis of DM2; however, last A1C 04/30/17 was 4.7, patient not on any DM2 medication. Patient is a former cigarette smoker; smoked 1ppd x 5 years, quit >30 years ago. Patient has been using OTC Tylenol and Zyrtec with minimal symptom improvement. Associated low grade elevated temperature, 66F. 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 Reported symptoms to [employer]. Underwent Covid test on Mon 7/13 which was negative. Patient states she develops similar to symptoms to current illness 3-4 times per year; always requires course of Levaquin or Biaxin for 12-14 days for symptom resolution. Per patient's Epic medication list, patient has been prescribed Levaquin and Biaxin several times a year for the past 6 years of records.   A limited review of symptoms was performed, pertinent positives and negatives as mentioned in HPI.  The following portions of the  patient's history were reviewed and updated as appropriate: allergies, current medications and past medical history.  Patient Active Problem List   Diagnosis Date Noted  . Status post total hip replacement, 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  . Status post total hip replacement, right 09/27/2017  . Incomplete emptying of bladder 02/16/2017  . Chronic cystitis 02/08/2017  . Hematuria, microscopic 02/08/2017  . History of nephrolithiasis 02/08/2017  . Pelvic pain in female 02/08/2017  . Chronic venous insufficiency 01/06/2017  . Varicose veins of leg with pain, right 12/31/2016  . GERD (gastroesophageal reflux disease) 03/01/2016  . 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 Hip arthritis 05/29/2015  . Degenerative joint disease (DJD) of hip 07/24/2014  . DJD (degenerative joint disease) of knee 06/17/2014  . Greater trochanteric bursitis of both hips 06/17/2014  . Degenerative joint disease of sacroiliac joint 06/17/2014  . Facet syndrome, lumbar 06/17/2014  . DDD (degenerative disc disease), lumbar 06/17/2014  . Low serum vitamin D 06/08/2013  . Sinusitis, acute, maxillary 02/08/2012  . Migraines 02/08/2012  . Depression with anxiety 02/08/2012  . Essential hypertension, benign 02/08/2012  . Pure hypercholesterolemia 02/08/2012  . Diabetes mellitus type 2 in obese ([place]) 02/08/2012    Allergies  Allergen Reactions  . Cephalexin Anaphylaxis  . Cephalosporins Anaphylaxis  . Penicillins Anaphylaxis and Other (See Comments)    Has patient had a PCN reaction causing immediate rash, facial/tongue/throat swelling, SOB or lightheadedness with hypotension: Yes Has patient had a PCN reaction causing severe rash involving mucus membranes or skin necrosis: No Has patient had a PCN reaction that required hospitalization Yes Has patient had a PCN reaction occurring within the last 10 years: No  If all of the above answers are "NO", then may proceed with Cephalosporin use.     Current Outpatient  Medications on File Prior to Visit  Medication Sig Dispense Refill  . beclomethasone (QVAR) 80 MCG/ACT inhaler Inhale 2 puffs into the lungs daily.     . cetirizine (ZYRTEC) 10 MG tablet Take 10 mg by mouth 2 (two) times daily.     . DULoxetine (CYMBALTA) 60 MG capsule Take 1 capsule (60 mg total) by mouth 2 (two) times daily. 180 capsule 1  . montelukast (SINGULAIR) 10 MG tablet Take 1 tablet (10 mg total) by mouth daily. 90 tablet 3  . pantoprazole (PROTONIX) 40 MG tablet TAKE 1 TABLET BY MOUTH TWO TIMES DAILY 180 tablet 3  . ACZONE 7.5 % GEL APPLY TOPICALLY TO THE AFFECTED AREA ON THE SKIN EVERY DAY    . Betamethasone Valerate 0.12 % foam     . clindamycin (CLEOCIN T) 1 % lotion   4  . clindamycin (CLEOCIN) 300 MG capsule Take 2 capsules 1 hour prior to dental procedure.    . diclofenac sodium (VOLTAREN) 1 % GEL Apply 1 application topically daily as needed for pain.  0  . doxycycline (MONODOX) 100 MG capsule   1  . HYDROcodone-acetaminophen (NORCO) 10-325 MG tablet Take 1 tablet by mouth every 6 (six) hours as needed.    [name] ketoconazole (NIZORAL) 2 % shampoo   5  . methocarbamol (ROBAXIN) 750 MG tablet     . Olopatadine HCl 0.2 % SOLN Apply 1 drop to eye daily. (Patient not taking: Reported on 08/10/2018) 2.5 mL 11  . oxyCODONE (OXY IR/ROXICODONE) 5 MG immediate release tablet     . oxyCODONE-acetaminophen (PERCOCET) 10-325 MG tablet     . phenazopyridine (PYRIDIUM) 200 MG tablet Take 1 tablet (200 mg total) by mouth 3 (three) times daily as needed for pain. (Patient not taking: Reported on 08/10/2018) 12 tablet 0  . PREVIDENT 5000 DRY MOUTH 1.1 % GEL dental gel Place 1 application onto teeth 2 (two) times daily.  5   No current facility-administered medications on file prior to visit.        Objective:   Vitals:   08/10/18 1411  BP: 98/64  Pulse: 97  Resp: 18  Temp: 98.4 F (36.9 C)  SpO2: 96%     Wt Readings from Last 3 Encounters:  08/10/18 201 lb 12.8 oz (91.5 kg)   04/05/18 202 lb (91.6 kg)  03/01/18 200 lb (90.7 kg)    Physical Exam:   General Appearance:  Patient sitting comfortably on examination table. Conversational. Kermit Balo self-historian. In no acute distress. Afebrile. Patient does have some mild facial pallor and scant diaphoresis along the forehead and upper lip. Patient appears fatigued and mildly ill.  Head:  Normocephalic, without obvious abnormality, atraumatic  Eyes:  PERRL, conjunctiva/corneas clear, EOM's intact  Ears:  Left ear canal: No erythema or edema. No open wound. No visible purulent drainage. No tenderness with palpation over left tragus or with manipulation of left auricle. No visible erythema or edema of left mastoid. No tenderness with palpation over left mastoid. Right ear canal:. No erythema or edema. No open wound. No visible purulent drainage. No tenderness with palpation over right tragus or with manipulation of right auricle. No visible erythema or edema of right mastoid. No tenderness with palpation over right mastoid. Left TM: Good light reflex. Visible landmarks. No erythema. No injection. No bulging or retraction. No visible perforation. Minimal clear serous effusion. No  visible purulent effusion. No tympanostomy tube. No scar tissue. Right TM:. Good light reflex. Visible landmarks. No erythema. No injection. No bulging or retraction. No visible perforation. Minimal clear serous effusion. No visible purulent effusion. No tympanostomy tube. No scar tissue.  Nose: Nares normal. Septum midline. No visible polyps. No discharge. Nasal mucosa with minimal bogginess and scant clear rhinorrhea. Moderate tenderness with palpation over bilateral maxillary sinuses.  Throat: Lips, mucosa, and tongue normal; teeth and gums normal. Throat reveals no erythema. Moderate thick yellow post-nasal drip in posterior pharynx. No visible cobblestoning. Tonsils with no enlargement or exudate. Uvula midline with no edema or erythema.  Neck: Supple,  symmetrical, trachea midline, no adenopathy  Lungs:   Clear to auscultation bilaterally, respirations unlabored. Good aeration. No rales, rhonchi, crackles or wheezing.  Heart:  Regular rate and rhythm  Abdomen:   Normal to inspection. Normoactive bowel sounds. No tenderness with palpation. No guarding, rigidity or rebound tenderness. No palpable organomegaly.  Extremities: Extremities normal, atraumatic, no cyanosis or edema  Pulses: 2+ and symmetric  Skin: Skin color, texture, turgor normal, no rashes or lesions  Lymph nodes: Cervical, supraclavicular, and axillary nodes normal  Neurologic: Normal    Assessment & Plan:    Exam findings, diagnosis etiology and medication use and indications reviewed with patient. Follow-Up and discharge instructions provided. No emergent/urgent issues found on exam.  Patient education was provided.   Patient verbalized understanding of information provided and agrees with plan of care (POC), all questions answered. The patient is advised to call or return to clinic if condition does not see an improvement in symptoms, or to seek the care of the closest emergency department if condition worsens with the below plan.    1. Acute non-recurrent maxillary sinusitis - clarithromycin (BIAXIN) 250 MG tablet; Take 1 tablet (250 mg total) by mouth 2 (two) times daily for 12 days.  Dispense: 24 tablet; Refill: 0 - predniSONE (STERAPRED UNI-PAK 21 TAB) 10 MG (21) TBPK tablet; Use as directed on package  Dispense: 21 tablet; Refill: 0 - Azelastine-Fluticasone 137-50 MCG/ACT SUSP; Place 2 sprays into the nose 2 (two) times a day.  Dispense: 23 g; Refill: 0  2. Cough  3. Eustachian tube dysfunction, bilateral  4. Antibiotic-induced yeast infection - fluconazole (DIFLUCAN) 150 MG tablet; Take 1 tablet (150 mg total) by mouth once for 1 dose.  Dispense: 1 tablet; Refill: 54  63 year old female presents with one week history of URI/sinus symptoms. VSS, afebrile, in no  acute distress, lungs clear to auscultation bilaterally. Negative Covid-19 test. Due to duration of illness and constitutional symptoms (malaise, fatigue, body aches, and nausea), will treat patient with antibiotic. Prescribed 12-day course of Biaxin (at patient's request). Due to ETD (ear pressure/tinnitus), sinus pain/pressure, and cough with hx of asthma - prescribed Sterapred Pak. Also prescribed Dymista nasal spray. Patient requested dose of Diflucan for possible vaginal yeast infection after long course of antibiotics; discussed risks, advised only to take if necessary, and if OTC Monistat does not work. Advised patient follow-up with PCP in 2-3 days if no significant symptom improvement. Advised immediate evaluation at the ED if patient develops chest pain, shortness of breath, or other new/concerning symptom. Patient agreed with plan.   Darlin Priestly, MHS, PA-C Montey Hora, MHS, PA-C Advanced Practice Provider [facility]  [place], [place] [address] (p): [phone] [email or web address]

## 2018-08-10 NOTE — Patient Instructions (Addendum)
Thank you for choosing [facility] for your health care needs.  You have been diagnosed with sinusitis (a sinus infection). 1. Acute non-recurrent maxillary sinusitis - clarithromycin (BIAXIN) 250 MG tablet; Take 1 tablet (250 mg total) by mouth 2 (two) times daily for 12 days.  Dispense: 24 tablet; Refill: 0 - predniSONE (STERAPRED UNI-PAK 21 TAB) 10 MG (21) TBPK tablet; Use as directed on package  Dispense: 21 tablet; Refill: 0 - Azelastine-Fluticasone 137-50 MCG/ACT SUSP; Place 2 sprays into the nose 2 (two) times a day.  Dispense: 23 g; Refill: 0  2. Cough  3. Eustachian tube dysfunction, bilateral  4. Antibiotic-induced yeast infection - fluconazole (DIFLUCAN) 150 MG tablet; Take 1 tablet (150 mg total) by mouth once for 1 dose.  Dispense: 1 tablet; Refill: 0  Take medications as prescribed.  May continue to use over-the-counter antihistamine such as Claritin, Allegra, or Zyrtec, your prescribed Singulair, a steroid nasal spray such as Flonase, and your albuterol inhaler (for cough).  Continue to use Q-Var inhaler as prescribed.  Increase fluids. Rest. May use warm compresses over forehead and cheeks to help with sinus pressure. May take over the counter Tylenol or ibuprofen for pain/discomfort.  Follow-up with family physician in 2-3 days if symptoms not improving. Sooner with any worsening symptoms. Go directly to the ED with chest pain, shortness of breath, or other new/concerning symptom.  Sinusitis, Adult Sinusitis is soreness and swelling (inflammation) of your sinuses. Sinuses are hollow spaces in the bones around your face. They are located:  Around your eyes.  In the middle of your forehead.  Behind your nose.  In your cheekbones. Your sinuses and nasal passages are lined with a fluid called mucus. Mucus drains out of your sinuses. Swelling can trap mucus in your sinuses. This lets germs (bacteria, virus, or fungus) grow, which leads to infection. Most of the time,  this condition is caused by a virus. What are the causes? This condition is caused by:  Allergies.  Asthma.  Germs.  Things that block your nose or sinuses.  Growths in the nose (nasal polyps).  Chemicals or irritants in the air.  Fungus (rare). What increases the risk? You are more likely to develop this condition if:  You have a weak body defense system (immune system).  You do a lot of swimming or diving.  You use nasal sprays too much.  You smoke. What are the signs or symptoms? The main symptoms of this condition are pain and a feeling of pressure around the sinuses. Other symptoms include:  Stuffy nose (congestion).  Runny nose (drainage).  Swelling and warmth in the sinuses.  Headache.  Toothache.  A cough that may get worse at night.  Mucus that collects in the throat or the back of the nose (postnasal drip).  Being unable to smell and taste.  Being very tired (fatigue).  A fever.  Sore throat.  Bad breath. How is this diagnosed? This condition is diagnosed based on:  Your symptoms.  Your medical history.  A physical exam.  Tests to find out if your condition is short-term (acute) or long-term (chronic). Your doctor may: ? Check your nose for growths (polyps). ? Check your sinuses using a tool that has a light (endoscope). ? Check for allergies or germs. ? Do imaging tests, such as an MRI or CT scan. How is this treated? Treatment for this condition depends on the cause and whether it is short-term or long-term.  If caused by a virus, your symptoms  should go away on their own within 10 days. You may be given medicines to relieve symptoms. They include: ? Medicines that shrink swollen tissue in the nose. ? Medicines that treat allergies (antihistamines). ? A spray that treats swelling of the nostrils. ? Rinses that help get rid of thick mucus in your nose (nasal saline washes).  If caused by bacteria, your doctor may wait to see if  you will get better without treatment. You may be given antibiotic medicine if you have: ? A very bad infection. ? A weak body defense system.  If caused by growths in the nose, you may need to have surgery. Follow these instructions at home: Medicines  Take, use, or apply over-the-counter and prescription medicines only as told by your doctor. These may include nasal sprays.  If you were prescribed an antibiotic medicine, take it as told by your doctor. Do not stop taking the antibiotic even if you start to feel better. Hydrate and humidify   Drink enough water to keep your pee (urine) pale yellow.  Use a cool mist humidifier to keep the humidity level in your home above 50%.  Breathe in steam for 10-15 minutes, 3-4 times a day, or as told by your doctor. You can do this in the bathroom while a hot shower is running.  Try not to spend time in cool or dry air. Rest  Rest as much as you can.  Sleep with your head raised (elevated).  Make sure you get enough sleep each night. General instructions   Put a warm, moist washcloth on your face 3-4 times a day, or as often as told by your doctor. This will help with discomfort.  Wash your hands often with soap and water. If there is no soap and water, use hand sanitizer.  Do not smoke. Avoid being around people who are smoking (secondhand smoke).  Keep all follow-up visits as told by your doctor. This is important. Contact a doctor if:  You have a fever.  Your symptoms get worse.  Your symptoms do not get better within 10 days. Get help right away if:  You have a very bad headache.  You cannot stop throwing up (vomiting).  You have very bad pain or swelling around your face or eyes.  You have trouble seeing.  You feel confused.  Your neck is stiff.  You have trouble breathing. Summary  Sinusitis is swelling of your sinuses. Sinuses are hollow spaces in the bones around your face.  This condition is caused by  tissues in your nose that become inflamed or swollen. This traps germs. These can lead to infection.  If you were prescribed an antibiotic medicine, take it as told by your doctor. Do not stop taking it even if you start to feel better.  Keep all follow-up visits as told by your doctor. This is important. This information is not intended to replace advice given to you by your health care provider. Make sure you discuss any questions you have with your health care provider. Document Released: 06/30/2007 Document Revised: 06/13/2017 Document Reviewed: 06/13/2017 Elsevier Patient Education  2020 [employer].

## 2018-08-15 ENCOUNTER — Telehealth: Payer: Self-pay

## 2018-08-15 NOTE — Telephone Encounter (Signed)
Patient states she isdoing good and she visited her Doctor today.

## 2018-08-23 ENCOUNTER — Telehealth: Payer: Self-pay | Admitting: Physician Assistant

## 2018-08-23 NOTE — Telephone Encounter (Signed)
Patient called [facility] requesting 2nd round of antibiotics for continued illness. Advised patient should f/u with PCP. Patient stated she has left multiple MyChart messages for her PCP with no response and called her PCP's office to schedule an appointment, but cannot be seen (even for a TeleHealth visit) for 2 weeks.  Called patient's PCP, Dr. [name] MD, with [facility] in [place]. Transferred from front desk to nurse. Gave nurse report. Nurse stated that she would send urgent message to PCP requesting patient receive call today.

## 2018-09-01 DIAGNOSIS — M25552 Pain in left hip: Secondary | ICD-10-CM | POA: Diagnosis not present

## 2018-09-01 DIAGNOSIS — M76892 Other specified enthesopathies of left lower limb, excluding foot: Secondary | ICD-10-CM | POA: Diagnosis not present

## 2018-09-01 DIAGNOSIS — M62838 Other muscle spasm: Secondary | ICD-10-CM | POA: Diagnosis not present

## 2018-09-01 DIAGNOSIS — M7062 Trochanteric bursitis, left hip: Secondary | ICD-10-CM | POA: Diagnosis not present

## 2018-09-04 DIAGNOSIS — M48062 Spinal stenosis, lumbar region with neurogenic claudication: Secondary | ICD-10-CM | POA: Diagnosis not present

## 2018-09-04 DIAGNOSIS — G894 Chronic pain syndrome: Secondary | ICD-10-CM | POA: Diagnosis not present

## 2018-09-04 DIAGNOSIS — G5601 Carpal tunnel syndrome, right upper limb: Secondary | ICD-10-CM | POA: Diagnosis not present

## 2018-09-04 DIAGNOSIS — Z5181 Encounter for therapeutic drug level monitoring: Secondary | ICD-10-CM | POA: Diagnosis not present

## 2018-09-04 DIAGNOSIS — M792 Neuralgia and neuritis, unspecified: Secondary | ICD-10-CM | POA: Diagnosis not present

## 2018-09-04 DIAGNOSIS — M7060 Trochanteric bursitis, unspecified hip: Secondary | ICD-10-CM | POA: Diagnosis not present

## 2018-09-04 DIAGNOSIS — M47897 Other spondylosis, lumbosacral region: Secondary | ICD-10-CM | POA: Diagnosis not present

## 2018-09-04 DIAGNOSIS — G8929 Other chronic pain: Secondary | ICD-10-CM | POA: Diagnosis not present

## 2018-09-04 DIAGNOSIS — M4726 Other spondylosis with radiculopathy, lumbar region: Secondary | ICD-10-CM | POA: Diagnosis not present

## 2018-09-04 DIAGNOSIS — M259 Joint disorder, unspecified: Secondary | ICD-10-CM | POA: Diagnosis not present

## 2018-09-04 DIAGNOSIS — E663 Overweight: Secondary | ICD-10-CM | POA: Diagnosis not present

## 2018-09-04 DIAGNOSIS — R51 Headache: Secondary | ICD-10-CM | POA: Diagnosis not present

## 2018-09-04 DIAGNOSIS — M4807 Spinal stenosis, lumbosacral region: Secondary | ICD-10-CM | POA: Diagnosis not present

## 2018-09-07 ENCOUNTER — Other Ambulatory Visit (HOSPITAL_COMMUNITY): Payer: Self-pay | Admitting: Sports Medicine

## 2018-09-07 ENCOUNTER — Other Ambulatory Visit: Payer: Self-pay | Admitting: Sports Medicine

## 2018-09-07 DIAGNOSIS — Z96642 Presence of left artificial hip joint: Secondary | ICD-10-CM

## 2018-09-07 DIAGNOSIS — M62838 Other muscle spasm: Secondary | ICD-10-CM

## 2018-09-07 DIAGNOSIS — M76892 Other specified enthesopathies of left lower limb, excluding foot: Secondary | ICD-10-CM

## 2018-09-07 DIAGNOSIS — M7062 Trochanteric bursitis, left hip: Secondary | ICD-10-CM

## 2018-09-07 DIAGNOSIS — M25552 Pain in left hip: Secondary | ICD-10-CM

## 2018-09-22 ENCOUNTER — Ambulatory Visit: Payer: 59

## 2018-10-03 DIAGNOSIS — G5601 Carpal tunnel syndrome, right upper limb: Secondary | ICD-10-CM | POA: Diagnosis not present

## 2018-10-03 DIAGNOSIS — M5136 Other intervertebral disc degeneration, lumbar region: Secondary | ICD-10-CM | POA: Diagnosis not present

## 2018-10-03 DIAGNOSIS — M7060 Trochanteric bursitis, unspecified hip: Secondary | ICD-10-CM | POA: Diagnosis not present

## 2018-10-03 DIAGNOSIS — M259 Joint disorder, unspecified: Secondary | ICD-10-CM | POA: Diagnosis not present

## 2018-11-03 DIAGNOSIS — J4540 Moderate persistent asthma, uncomplicated: Secondary | ICD-10-CM | POA: Diagnosis not present

## 2018-11-03 DIAGNOSIS — M5136 Other intervertebral disc degeneration, lumbar region: Secondary | ICD-10-CM | POA: Diagnosis not present

## 2018-11-03 DIAGNOSIS — J320 Chronic maxillary sinusitis: Secondary | ICD-10-CM | POA: Diagnosis not present

## 2018-11-03 DIAGNOSIS — J301 Allergic rhinitis due to pollen: Secondary | ICD-10-CM | POA: Diagnosis not present

## 2018-12-01 DIAGNOSIS — E7800 Pure hypercholesterolemia, unspecified: Secondary | ICD-10-CM | POA: Diagnosis not present

## 2018-12-01 DIAGNOSIS — G894 Chronic pain syndrome: Secondary | ICD-10-CM | POA: Diagnosis not present

## 2018-12-01 DIAGNOSIS — R7989 Other specified abnormal findings of blood chemistry: Secondary | ICD-10-CM | POA: Diagnosis not present

## 2018-12-01 DIAGNOSIS — F418 Other specified anxiety disorders: Secondary | ICD-10-CM | POA: Diagnosis not present

## 2018-12-01 DIAGNOSIS — Z0000 Encounter for general adult medical examination without abnormal findings: Secondary | ICD-10-CM | POA: Diagnosis not present

## 2018-12-01 DIAGNOSIS — E1169 Type 2 diabetes mellitus with other specified complication: Secondary | ICD-10-CM | POA: Diagnosis not present

## 2018-12-01 DIAGNOSIS — E669 Obesity, unspecified: Secondary | ICD-10-CM | POA: Diagnosis not present

## 2018-12-01 DIAGNOSIS — Z9884 Bariatric surgery status: Secondary | ICD-10-CM | POA: Diagnosis not present

## 2018-12-01 DIAGNOSIS — Z1159 Encounter for screening for other viral diseases: Secondary | ICD-10-CM | POA: Diagnosis not present

## 2018-12-04 ENCOUNTER — Other Ambulatory Visit: Payer: Self-pay | Admitting: Family Medicine

## 2018-12-04 DIAGNOSIS — Z1231 Encounter for screening mammogram for malignant neoplasm of breast: Secondary | ICD-10-CM

## 2018-12-15 DIAGNOSIS — Z86010 Personal history of colonic polyps: Secondary | ICD-10-CM | POA: Diagnosis not present

## 2018-12-19 ENCOUNTER — Other Ambulatory Visit: Payer: Self-pay | Admitting: Family Medicine

## 2018-12-19 ENCOUNTER — Ambulatory Visit
Admission: RE | Admit: 2018-12-19 | Discharge: 2018-12-19 | Disposition: A | Discharge status: Home / Self Care | Payer: 59 | Source: Ambulatory Visit | Attending: Family Medicine | Admitting: Family Medicine

## 2018-12-19 DIAGNOSIS — Z1231 Encounter for screening mammogram for malignant neoplasm of breast: Secondary | ICD-10-CM | POA: Insufficient documentation

## 2018-12-26 ENCOUNTER — Other Ambulatory Visit: Payer: Self-pay | Admitting: Family Medicine

## 2018-12-26 DIAGNOSIS — N644 Mastodynia: Secondary | ICD-10-CM

## 2019-01-02 ENCOUNTER — Other Ambulatory Visit: Payer: 59

## 2019-01-12 DIAGNOSIS — M5136 Other intervertebral disc degeneration, lumbar region: Secondary | ICD-10-CM | POA: Diagnosis not present

## 2019-01-12 DIAGNOSIS — M1610 Unilateral primary osteoarthritis, unspecified hip: Secondary | ICD-10-CM | POA: Diagnosis not present

## 2019-01-12 DIAGNOSIS — M461 Sacroiliitis, not elsewhere classified: Secondary | ICD-10-CM | POA: Diagnosis not present

## 2019-01-12 DIAGNOSIS — G894 Chronic pain syndrome: Secondary | ICD-10-CM | POA: Diagnosis not present

## 2019-01-25 DIAGNOSIS — Z01812 Encounter for preprocedural laboratory examination: Secondary | ICD-10-CM | POA: Diagnosis not present

## 2019-01-30 DIAGNOSIS — Z9884 Bariatric surgery status: Secondary | ICD-10-CM | POA: Diagnosis not present

## 2019-01-30 DIAGNOSIS — R1310 Dysphagia, unspecified: Secondary | ICD-10-CM | POA: Diagnosis not present

## 2019-01-30 DIAGNOSIS — K219 Gastro-esophageal reflux disease without esophagitis: Secondary | ICD-10-CM | POA: Diagnosis not present

## 2019-01-30 DIAGNOSIS — K640 First degree hemorrhoids: Secondary | ICD-10-CM | POA: Diagnosis not present

## 2019-01-30 DIAGNOSIS — K222 Esophageal obstruction: Secondary | ICD-10-CM | POA: Diagnosis not present

## 2019-01-30 DIAGNOSIS — K5730 Diverticulosis of large intestine without perforation or abscess without bleeding: Secondary | ICD-10-CM | POA: Diagnosis not present

## 2019-01-30 DIAGNOSIS — K208 Other esophagitis without bleeding: Secondary | ICD-10-CM | POA: Diagnosis not present

## 2019-01-30 DIAGNOSIS — K228 Other specified diseases of esophagus: Secondary | ICD-10-CM | POA: Diagnosis not present

## 2019-01-30 DIAGNOSIS — K209 Esophagitis, unspecified without bleeding: Secondary | ICD-10-CM | POA: Diagnosis not present

## 2019-01-30 DIAGNOSIS — Z86010 Personal history of colonic polyps: Secondary | ICD-10-CM | POA: Diagnosis not present

## 2019-01-30 DIAGNOSIS — J45909 Unspecified asthma, uncomplicated: Secondary | ICD-10-CM | POA: Diagnosis not present

## 2019-02-04 DIAGNOSIS — S82422A Displaced transverse fracture of shaft of left fibula, initial encounter for closed fracture: Secondary | ICD-10-CM | POA: Diagnosis not present

## 2019-02-07 DIAGNOSIS — S93402A Sprain of unspecified ligament of left ankle, initial encounter: Secondary | ICD-10-CM | POA: Diagnosis not present

## 2019-02-07 DIAGNOSIS — G894 Chronic pain syndrome: Secondary | ICD-10-CM | POA: Diagnosis not present

## 2019-02-07 DIAGNOSIS — M25572 Pain in left ankle and joints of left foot: Secondary | ICD-10-CM | POA: Diagnosis not present

## 2019-02-07 DIAGNOSIS — S8252XA Displaced fracture of medial malleolus of left tibia, initial encounter for closed fracture: Secondary | ICD-10-CM | POA: Diagnosis not present

## 2019-02-21 DIAGNOSIS — R262 Difficulty in walking, not elsewhere classified: Secondary | ICD-10-CM | POA: Diagnosis not present

## 2019-02-21 DIAGNOSIS — S82842D Displaced bimalleolar fracture of left lower leg, subsequent encounter for closed fracture with routine healing: Secondary | ICD-10-CM | POA: Diagnosis not present

## 2019-02-21 DIAGNOSIS — M7989 Other specified soft tissue disorders: Secondary | ICD-10-CM | POA: Diagnosis not present

## 2019-02-21 DIAGNOSIS — M25572 Pain in left ankle and joints of left foot: Secondary | ICD-10-CM | POA: Diagnosis not present

## 2019-02-21 DIAGNOSIS — S93402A Sprain of unspecified ligament of left ankle, initial encounter: Secondary | ICD-10-CM | POA: Diagnosis not present

## 2019-03-07 DIAGNOSIS — R262 Difficulty in walking, not elsewhere classified: Secondary | ICD-10-CM | POA: Diagnosis not present

## 2019-03-07 DIAGNOSIS — M25472 Effusion, left ankle: Secondary | ICD-10-CM | POA: Diagnosis not present

## 2019-03-07 DIAGNOSIS — M25572 Pain in left ankle and joints of left foot: Secondary | ICD-10-CM | POA: Diagnosis not present

## 2019-03-07 DIAGNOSIS — S82842D Displaced bimalleolar fracture of left lower leg, subsequent encounter for closed fracture with routine healing: Secondary | ICD-10-CM | POA: Diagnosis not present

## 2019-03-09 DIAGNOSIS — F418 Other specified anxiety disorders: Secondary | ICD-10-CM | POA: Diagnosis not present

## 2019-03-09 DIAGNOSIS — M5136 Other intervertebral disc degeneration, lumbar region: Secondary | ICD-10-CM | POA: Diagnosis not present

## 2019-03-09 DIAGNOSIS — G894 Chronic pain syndrome: Secondary | ICD-10-CM | POA: Diagnosis not present

## 2019-03-09 DIAGNOSIS — S82842D Displaced bimalleolar fracture of left lower leg, subsequent encounter for closed fracture with routine healing: Secondary | ICD-10-CM | POA: Diagnosis not present

## 2019-03-22 ENCOUNTER — Telehealth: Payer: 59 | Admitting: Emergency Medicine

## 2019-03-22 DIAGNOSIS — R300 Dysuria: Secondary | ICD-10-CM

## 2019-03-22 MED ORDER — NITROFURANTOIN MONOHYD MACRO 100 MG PO CAPS: 100.0000 mg | ORAL_CAPSULE | Freq: Two times a day (BID) | ORAL | 0 refills | Status: DC

## 2019-03-22 MED ORDER — PHENAZOPYRIDINE HCL 200 MG PO TABS: 200.0000 mg | ORAL_TABLET | Freq: Three times a day (TID) | ORAL | 0 refills | Status: DC

## 2019-03-22 NOTE — Progress Notes (Signed)
We are sorry that you are not feeling well.  Here is how we plan to help!  Based on what you shared with me it looks like you most likely have a simple urinary tract infection.  A UTI (Urinary Tract Infection) is a bacterial infection of the bladder.  Most cases of urinary tract infections are simple to treat but a key part of your care is to encourage you to drink plenty of fluids and watch your symptoms carefully.  I have prescribed MacroBid 100 mg twice a day for 5 days.  Your symptoms should gradually improve. Call us if the burning in your urine worsens, you develop worsening fever, back pain or pelvic pain or if your symptoms do not resolve after completing the antibiotic.  Urinary tract infections can be prevented by drinking plenty of water to keep your body hydrated.  Also be sure when you wipe, wipe from front to back and don't hold it in!  If possible, empty your bladder every 4 hours.  Your e-visit answers were reviewed by a board certified advanced clinical practitioner to complete your personal care plan.  Depending on the condition, your plan could have included both over the counter or prescription medications.  If there is a problem please reply  once you have received a response from your provider.  Your safety is important to us.  If you have drug allergies check your prescription carefully.    You can use MyChart to ask questions about today's visit, request a non-urgent call back, or ask for a work or school excuse for 24 hours related to this e-Visit. If it has been greater than 24 hours you will need to follow up with your provider, or enter a new e-Visit to address those concerns.   You will get an e-mail in the next two days asking about your experience.  I hope that your e-visit has been valuable and will speed your recovery. Thank you for using e-visits.   Approximately 5 minutes was used in reviewing the patient's chart, questionnaire, prescribing medications, and  documentation.  

## 2019-03-23 DIAGNOSIS — R262 Difficulty in walking, not elsewhere classified: Secondary | ICD-10-CM | POA: Diagnosis not present

## 2019-03-23 DIAGNOSIS — S82842D Displaced bimalleolar fracture of left lower leg, subsequent encounter for closed fracture with routine healing: Secondary | ICD-10-CM | POA: Diagnosis not present

## 2019-03-23 DIAGNOSIS — M25572 Pain in left ankle and joints of left foot: Secondary | ICD-10-CM | POA: Diagnosis not present

## 2019-03-23 DIAGNOSIS — M25472 Effusion, left ankle: Secondary | ICD-10-CM | POA: Diagnosis not present

## 2019-04-09 ENCOUNTER — Other Ambulatory Visit: Payer: Self-pay | Admitting: Nurse Practitioner

## 2019-04-09 DIAGNOSIS — E119 Type 2 diabetes mellitus without complications: Secondary | ICD-10-CM

## 2019-04-09 DIAGNOSIS — U071 COVID-19: Secondary | ICD-10-CM

## 2019-04-09 DIAGNOSIS — E1169 Type 2 diabetes mellitus with other specified complication: Secondary | ICD-10-CM

## 2019-04-09 NOTE — Progress Notes (Signed)
  I connected by phone with Bethany Scott on 04/09/2019 at 10:06 AM to discuss the potential use of an new treatment for mild to moderate COVID-19 viral infection in non-hospitalized patients.  This patient is a 64 y.o. female that meets the FDA criteria for Emergency Use Authorization of bamlanivimab or casirivimab\imdevimab.  Has a (+) direct SARS-CoV-2 viral test result  Has mild or moderate COVID-19   Is ? 64 years of age and weighs ? 40 kg  Is NOT hospitalized due to COVID-19  Is NOT requiring oxygen therapy or requiring an increase in baseline oxygen flow rate due to COVID-19  Is within 10 days of symptom onset  Has at least one of the high risk factor(s) for progression to severe COVID-19 and/or hospitalization as defined in EUA.  Specific high risk criteria : Diabetes   I have spoken and communicated the following to the patient or parent/caregiver:  1. FDA has authorized the emergency use of bamlanivimab and casirivimab\imdevimab for the treatment of mild to moderate COVID-19 in adults and pediatric patients with positive results of direct SARS-CoV-2 viral testing who are 42 years of age and older weighing at least 40 kg, and who are at high risk for progressing to severe COVID-19 and/or hospitalization.  2. The significant known and potential risks and benefits of bamlanivimab and casirivimab\imdevimab, and the extent to which such potential risks and benefits are unknown.  3. Information on available alternative treatments and the risks and benefits of those alternatives, including clinical trials.  4. Patients treated with bamlanivimab and casirivimab\imdevimab should continue to self-isolate and use infection control measures (e.g., wear mask, isolate, social distance, avoid sharing personal items, clean and disinfect "high touch" surfaces, and frequent handwashing) according to CDC guidelines.   5. The patient or parent/caregiver has the option to accept or refuse  bamlanivimab or casirivimab\imdevimab .  After reviewing this information with the patient, The patient agreed to proceed with receiving the bamlanimivab infusion and will be provided a copy of the Fact sheet prior to receiving the infusion.[name] 04/09/2019 10:06 AM

## 2019-04-10 ENCOUNTER — Ambulatory Visit (HOSPITAL_COMMUNITY)
Admission: RE | Admit: 2019-04-10 | Discharge: 2019-04-10 | Disposition: A | Discharge status: Home / Self Care | Payer: 59 | Source: Ambulatory Visit | Attending: Pulmonary Disease | Admitting: Pulmonary Disease

## 2019-04-10 DIAGNOSIS — E669 Obesity, unspecified: Secondary | ICD-10-CM | POA: Diagnosis not present

## 2019-04-10 DIAGNOSIS — U071 COVID-19: Secondary | ICD-10-CM | POA: Diagnosis not present

## 2019-04-10 DIAGNOSIS — E1169 Type 2 diabetes mellitus with other specified complication: Secondary | ICD-10-CM | POA: Diagnosis not present

## 2019-04-10 MED ORDER — DIPHENHYDRAMINE HCL 50 MG/ML IJ SOLN: 50.0000 mg | Freq: Once | INTRAMUSCULAR | Status: DC | PRN

## 2019-04-10 MED ORDER — SODIUM CHLORIDE 0.9 % IV SOLN
700.0000 mg | Freq: Once | INTRAVENOUS | Status: AC
  Administered 2019-04-10: 700 mg via INTRAVENOUS
  Filled 2019-04-10: qty 700

## 2019-04-10 MED ORDER — METHYLPREDNISOLONE SODIUM SUCC 125 MG IJ SOLR: 125.0000 mg | Freq: Once | INTRAMUSCULAR | Status: DC | PRN

## 2019-04-10 MED ORDER — FAMOTIDINE IN NACL 20-0.9 MG/50ML-% IV SOLN: 20.0000 mg | Freq: Once | INTRAVENOUS | Status: DC | PRN

## 2019-04-10 MED ORDER — SODIUM CHLORIDE 0.9 % IV SOLN
INTRAVENOUS | Status: DC | PRN
  Administered 2019-04-10: 250 mL via INTRAVENOUS

## 2019-04-10 MED ORDER — EPINEPHRINE 0.3 MG/0.3ML IJ SOAJ: 0.3000 mg | Freq: Once | INTRAMUSCULAR | Status: DC | PRN

## 2019-04-10 MED ORDER — ALBUTEROL SULFATE HFA 108 (90 BASE) MCG/ACT IN AERS: 2.0000 | INHALATION_SPRAY | Freq: Once | RESPIRATORY_TRACT | Status: DC | PRN

## 2019-04-10 NOTE — Progress Notes (Signed)
  Diagnosis: COVID-19  Physician: Dr. Joya Gaskins  Procedure: [facility] Med: bamlanivimab infusion - Provided patient with bamlanimivab fact sheet for patients, parents and caregivers prior to infusion.  Complications: No immediate complications noted.  Discharge: Discharged home   Bethany Scott 04/10/2019

## 2019-04-10 NOTE — Discharge Instructions (Signed)

## 2019-04-20 DIAGNOSIS — S82842D Displaced bimalleolar fracture of left lower leg, subsequent encounter for closed fracture with routine healing: Secondary | ICD-10-CM | POA: Diagnosis not present

## 2019-04-20 DIAGNOSIS — R258 Other abnormal involuntary movements: Secondary | ICD-10-CM | POA: Diagnosis not present

## 2019-04-20 DIAGNOSIS — M25472 Effusion, left ankle: Secondary | ICD-10-CM | POA: Diagnosis not present

## 2019-04-20 DIAGNOSIS — M25572 Pain in left ankle and joints of left foot: Secondary | ICD-10-CM | POA: Diagnosis not present

## 2019-04-22 ENCOUNTER — Telehealth: Payer: 59 | Admitting: Family

## 2019-04-22 DIAGNOSIS — J019 Acute sinusitis, unspecified: Secondary | ICD-10-CM | POA: Diagnosis not present

## 2019-04-22 MED ORDER — LEVOFLOXACIN 500 MG PO TABS: 500.0000 mg | ORAL_TABLET | Freq: Every day | ORAL | 0 refills | Status: DC

## 2019-04-22 NOTE — Progress Notes (Signed)
We are sorry that you are not feeling well.  Here is how we plan to help!  Based on what you have shared with me it looks like you have sinusitis.  Sinusitis is inflammation and infection in the sinus cavities of the head.  Based on your presentation I believe you most likely have Acute Bacterial Sinusitis.  This is an infection caused by bacteria and is treated with antibiotics. I have prescribed Levofloxicin 500mg by mouth once daily for 7 days. You may use an oral decongestant such as Mucinex D or if you have glaucoma or high blood pressure use plain Mucinex. Saline nasal spray help and can safely be used as often as needed for congestion.  If you develop worsening sinus pain, fever or notice severe headache and vision changes, or if symptoms are not better after completion of antibiotic, please schedule an appointment with a health care provider.    Sinus infections are not as easily transmitted as other respiratory infection, however we still recommend that you avoid close contact with loved ones, especially the very young and elderly.  Remember to wash your hands thoroughly throughout the day as this is the number one way to prevent the spread of infection!  Home Care:  Only take medications as instructed by your medical team.  Complete the entire course of an antibiotic.  Do not take these medications with alcohol.  A steam or ultrasonic humidifier can help congestion.  You can place a towel over your head and breathe in the steam from hot water coming from a faucet.  Avoid close contacts especially the very young and the elderly.  Cover your mouth when you cough or sneeze.  Always remember to wash your hands.  Get Help Right Away If:  You develop worsening fever or sinus pain.  You develop a severe head ache or visual changes.  Your symptoms persist after you have completed your treatment plan.  Make sure you  Understand these instructions.  Will watch your  condition.  Will get help right away if you are not doing well or get worse.  Your e-visit answers were reviewed by a board certified advanced clinical practitioner to complete your personal care plan.  Depending on the condition, your plan could have included both over the counter or prescription medications.  If there is a problem please reply  once you have received a response from your provider.  Your safety is important to us.  If you have drug allergies check your prescription carefully.    You can use MyChart to ask questions about today's visit, request a non-urgent call back, or ask for a work or school excuse for 24 hours related to this e-Visit. If it has been greater than 24 hours you will need to follow up with your provider, or enter a new e-Visit to address those concerns.  You will get an e-mail in the next two days asking about your experience.  I hope that your e-visit has been valuable and will speed your recovery. Thank you for using e-visits.  Approximately 5 minutes was spent documenting and reviewing patient's chart.   

## 2019-05-11 DIAGNOSIS — M19072 Primary osteoarthritis, left ankle and foot: Secondary | ICD-10-CM | POA: Diagnosis not present

## 2019-05-11 DIAGNOSIS — M79672 Pain in left foot: Secondary | ICD-10-CM | POA: Diagnosis not present

## 2019-05-11 DIAGNOSIS — R258 Other abnormal involuntary movements: Secondary | ICD-10-CM | POA: Diagnosis not present

## 2019-05-11 DIAGNOSIS — G5792 Unspecified mononeuropathy of left lower limb: Secondary | ICD-10-CM | POA: Diagnosis not present

## 2019-05-16 ENCOUNTER — Telehealth: Payer: Self-pay | Admitting: Dermatology

## 2019-05-16 DIAGNOSIS — E538 Deficiency of other specified B group vitamins: Secondary | ICD-10-CM | POA: Diagnosis not present

## 2019-05-16 DIAGNOSIS — M5090 Cervical disc disorder, unspecified, unspecified cervical region: Secondary | ICD-10-CM | POA: Diagnosis not present

## 2019-05-16 DIAGNOSIS — E531 Pyridoxine deficiency: Secondary | ICD-10-CM | POA: Diagnosis not present

## 2019-05-16 DIAGNOSIS — E519 Thiamine deficiency, unspecified: Secondary | ICD-10-CM | POA: Diagnosis not present

## 2019-05-16 DIAGNOSIS — R258 Other abnormal involuntary movements: Secondary | ICD-10-CM | POA: Diagnosis not present

## 2019-05-16 DIAGNOSIS — E559 Vitamin D deficiency, unspecified: Secondary | ICD-10-CM | POA: Diagnosis not present

## 2019-05-16 DIAGNOSIS — R292 Abnormal reflex: Secondary | ICD-10-CM | POA: Diagnosis not present

## 2019-05-16 MED ORDER — FLUCONAZOLE 150 MG PO TABS: 150.0000 mg | ORAL_TABLET | Freq: Once | ORAL | 2 refills | Status: DC | PRN

## 2019-05-16 NOTE — Telephone Encounter (Signed)
Patient with yeast infection after recent antibiotic treatment. Has tolerated fluconazole well in the past. Will send in rx.

## 2019-05-17 ENCOUNTER — Other Ambulatory Visit: Payer: Self-pay | Admitting: Neurology

## 2019-05-17 DIAGNOSIS — R292 Abnormal reflex: Secondary | ICD-10-CM

## 2019-05-31 ENCOUNTER — Ambulatory Visit
Admission: RE | Admit: 2019-05-31 | Discharge: 2019-05-31 | Disposition: A | Discharge status: Home / Self Care | Payer: 59 | Source: Ambulatory Visit | Attending: Neurology | Admitting: Neurology

## 2019-05-31 ENCOUNTER — Ambulatory Visit: Admission: RE | Admit: 2019-05-31 | Payer: 59 | Source: Ambulatory Visit

## 2019-05-31 ENCOUNTER — Other Ambulatory Visit: Payer: Self-pay

## 2019-05-31 DIAGNOSIS — M4802 Spinal stenosis, cervical region: Secondary | ICD-10-CM | POA: Diagnosis not present

## 2019-05-31 DIAGNOSIS — R292 Abnormal reflex: Secondary | ICD-10-CM | POA: Insufficient documentation

## 2019-05-31 DIAGNOSIS — M50223 Other cervical disc displacement at C6-C7 level: Secondary | ICD-10-CM | POA: Diagnosis not present

## 2019-05-31 DIAGNOSIS — R531 Weakness: Secondary | ICD-10-CM | POA: Diagnosis not present

## 2019-05-31 MED ORDER — GADOBUTROL 1 MMOL/ML IV SOLN
9.0000 mL | Freq: Once | INTRAVENOUS | Status: AC | PRN
  Administered 2019-05-31: 18:00:00 9 mL via INTRAVENOUS

## 2019-06-03 ENCOUNTER — Other Ambulatory Visit: Payer: Self-pay | Admitting: Dermatology

## 2019-06-05 DIAGNOSIS — G894 Chronic pain syndrome: Secondary | ICD-10-CM | POA: Diagnosis not present

## 2019-06-05 DIAGNOSIS — S82842D Displaced bimalleolar fracture of left lower leg, subsequent encounter for closed fracture with routine healing: Secondary | ICD-10-CM | POA: Diagnosis not present

## 2019-06-05 DIAGNOSIS — E1169 Type 2 diabetes mellitus with other specified complication: Secondary | ICD-10-CM | POA: Diagnosis not present

## 2019-06-05 DIAGNOSIS — M461 Sacroiliitis, not elsewhere classified: Secondary | ICD-10-CM | POA: Diagnosis not present

## 2019-06-05 DIAGNOSIS — E669 Obesity, unspecified: Secondary | ICD-10-CM | POA: Diagnosis not present

## 2019-06-08 DIAGNOSIS — H40013 Open angle with borderline findings, low risk, bilateral: Secondary | ICD-10-CM | POA: Diagnosis not present

## 2019-06-15 DIAGNOSIS — K5909 Other constipation: Secondary | ICD-10-CM | POA: Diagnosis not present

## 2019-06-15 DIAGNOSIS — K2100 Gastro-esophageal reflux disease with esophagitis, without bleeding: Secondary | ICD-10-CM | POA: Diagnosis not present

## 2019-06-18 ENCOUNTER — Other Ambulatory Visit: Payer: Self-pay

## 2019-06-18 MED ORDER — KETOCONAZOLE 2 % EX SHAM: 1.0000 "application " | MEDICATED_SHAMPOO | CUTANEOUS | 0 refills | Status: DC

## 2019-06-19 ENCOUNTER — Telehealth: Payer: Self-pay

## 2019-06-19 NOTE — Telephone Encounter (Signed)
Patient currently has a rash in bikini line/area. She has asked for a RF of Alcortin-A but since the medication is no longer available can we send in RX to [facility]?

## 2019-06-19 NOTE — Telephone Encounter (Signed)
Yes we can send to [facility], 3 rfs

## 2019-06-20 NOTE — Telephone Encounter (Signed)
Prescription sent in and patient aware 

## 2019-07-08 ENCOUNTER — Telehealth: Payer: 59 | Admitting: Physician Assistant

## 2019-07-08 DIAGNOSIS — J069 Acute upper respiratory infection, unspecified: Secondary | ICD-10-CM | POA: Diagnosis not present

## 2019-07-08 MED ORDER — LEVOFLOXACIN 500 MG PO TABS: 500.0000 mg | ORAL_TABLET | Freq: Every day | ORAL | 0 refills | Status: DC

## 2019-07-08 NOTE — Progress Notes (Signed)
We are sorry that you are not feeling well.  If your symptoms fail to improve with the following treatment plan, please follow-up with your PCP.   I have prescribed Levofloxacin 500 mg daily for 7 days   In addition, for a cough you may use A prescription cough medication called Tessalon Perles 100mg . You may take 1-2 capsules every 8 hours as needed for your cough.  You should also use an over the counter nasal spray, such as Flonase and/or saline nasal rinse for your congestion.  OTC OpCon-A for irritated eyes.  From your responses in the eVisit questionnaire you describe inflammation in the upper respiratory tract which is causing a significant cough.  This is commonly called Bronchitis and has four common causes:    Allergies  Viral Infections  Acid Reflux  Bacterial Infection Allergies, viruses and acid reflux are treated by controlling symptoms or eliminating the cause. An example might be a cough caused by taking certain blood pressure medications. You stop the cough by changing the medication. Another example might be a cough caused by acid reflux. Controlling the reflux helps control the cough.  HOME CARE . Only take medications as instructed by your medical team. . Complete the entire course of an antibiotic. . Drink plenty of fluids and get plenty of rest. . Avoid close contacts especially the very young and the elderly . Cover your mouth if you cough or cough into your sleeve. . Always remember to wash your hands . A steam or ultrasonic humidifier can help congestion.   GET HELP RIGHT AWAY IF: . You develop worsening fever. . You become short of breath . You cough up blood. . Your symptoms persist after you have completed your treatment plan MAKE SURE YOU   Understand these instructions.  Will watch your condition.  Will get help right away if you are not doing well or get worse.  Your e-visit answers were reviewed by a board certified advanced clinical practitioner  to complete your personal care plan.  Depending on the condition, your plan could have included both over the counter or prescription medications. If there is a problem please reply  once you have received a response from your provider. Your safety is important to [place].  If you have drug allergies check your prescription carefully.    You can use MyChart to ask questions about today's visit, request a non-urgent call back, or ask for a work or school excuse for 24 hours related to this e-Visit. If it has been greater than 24 hours you will need to follow up with your provider, or enter a new e-Visit to address those concerns. You will get an e-mail in the next two days asking about your experience.  I hope that your e-visit has been valuable and will speed your recovery. Thank you for using e-visits.  Greater than 5 minutes, yet less than 10 minutes of time have been spent researching, coordinating and implementing care for this patient today.

## 2019-07-17 ENCOUNTER — Other Ambulatory Visit: Payer: Self-pay

## 2019-07-17 DIAGNOSIS — L578 Other skin changes due to chronic exposure to nonionizing radiation: Secondary | ICD-10-CM

## 2019-07-17 MED ORDER — IMIQUIMOD 5 % EX CREA: TOPICAL_CREAM | CUTANEOUS | 0 refills | Status: DC

## 2019-07-28 ENCOUNTER — Other Ambulatory Visit: Payer: Self-pay | Admitting: Dermatology

## 2019-08-02 ENCOUNTER — Other Ambulatory Visit: Payer: Self-pay

## 2019-08-02 MED ORDER — CLARITHROMYCIN 500 MG PO TABS: 500.0000 mg | ORAL_TABLET | Freq: Two times a day (BID) | ORAL | 0 refills | Status: AC

## 2019-08-02 NOTE — Progress Notes (Signed)
Sent in Biaxin 500mg  1 po bid for 10 days #20, 0rf/sh

## 2019-08-15 DIAGNOSIS — M5090 Cervical disc disorder, unspecified, unspecified cervical region: Secondary | ICD-10-CM | POA: Diagnosis not present

## 2019-08-15 DIAGNOSIS — R292 Abnormal reflex: Secondary | ICD-10-CM | POA: Diagnosis not present

## 2019-08-15 DIAGNOSIS — R0789 Other chest pain: Secondary | ICD-10-CM | POA: Diagnosis not present

## 2019-08-15 DIAGNOSIS — R258 Other abnormal involuntary movements: Secondary | ICD-10-CM | POA: Diagnosis not present

## 2019-08-17 ENCOUNTER — Other Ambulatory Visit (HOSPITAL_COMMUNITY): Payer: Self-pay | Admitting: Neurology

## 2019-08-17 ENCOUNTER — Other Ambulatory Visit: Payer: Self-pay | Admitting: Neurology

## 2019-08-17 DIAGNOSIS — R292 Abnormal reflex: Secondary | ICD-10-CM

## 2019-08-17 DIAGNOSIS — K047 Periapical abscess without sinus: Secondary | ICD-10-CM | POA: Diagnosis not present

## 2019-08-17 DIAGNOSIS — Z9884 Bariatric surgery status: Secondary | ICD-10-CM | POA: Diagnosis not present

## 2019-08-17 DIAGNOSIS — M5442 Lumbago with sciatica, left side: Secondary | ICD-10-CM | POA: Diagnosis not present

## 2019-08-17 DIAGNOSIS — G894 Chronic pain syndrome: Secondary | ICD-10-CM | POA: Diagnosis not present

## 2019-08-29 ENCOUNTER — Other Ambulatory Visit: Payer: Self-pay | Admitting: Neurology

## 2019-08-31 ENCOUNTER — Ambulatory Visit
Admission: RE | Admit: 2019-08-31 | Discharge: 2019-08-31 | Disposition: A | Discharge status: Home / Self Care | Payer: 59 | Source: Ambulatory Visit | Attending: Family Medicine | Admitting: Family Medicine

## 2019-08-31 ENCOUNTER — Other Ambulatory Visit: Payer: Self-pay

## 2019-08-31 DIAGNOSIS — Z1231 Encounter for screening mammogram for malignant neoplasm of breast: Secondary | ICD-10-CM | POA: Insufficient documentation

## 2019-09-06 DIAGNOSIS — F418 Other specified anxiety disorders: Secondary | ICD-10-CM | POA: Diagnosis not present

## 2019-09-06 DIAGNOSIS — G894 Chronic pain syndrome: Secondary | ICD-10-CM | POA: Diagnosis not present

## 2019-09-06 DIAGNOSIS — K047 Periapical abscess without sinus: Secondary | ICD-10-CM | POA: Diagnosis not present

## 2019-09-06 DIAGNOSIS — E669 Obesity, unspecified: Secondary | ICD-10-CM | POA: Diagnosis not present

## 2019-09-06 DIAGNOSIS — E1169 Type 2 diabetes mellitus with other specified complication: Secondary | ICD-10-CM | POA: Diagnosis not present

## 2019-09-09 ENCOUNTER — Other Ambulatory Visit (HOSPITAL_COMMUNITY): Payer: Self-pay | Admitting: Family Medicine

## 2019-09-27 ENCOUNTER — Ambulatory Visit: Payer: 59

## 2019-10-02 ENCOUNTER — Other Ambulatory Visit: Payer: Self-pay

## 2019-10-02 MED ORDER — IVERMECTIN 3 MG PO TABS: ORAL_TABLET | ORAL | 1 refills | Status: DC

## 2019-10-02 NOTE — Progress Notes (Signed)
Ivermectin RX.

## 2019-10-02 NOTE — Progress Notes (Signed)
RX sent in per Dr. Nicole Kindred.

## 2019-10-08 ENCOUNTER — Other Ambulatory Visit: Payer: Self-pay

## 2019-10-08 MED ORDER — LEVOFLOXACIN 500 MG PO TABS: 500.0000 mg | ORAL_TABLET | Freq: Every day | ORAL | 0 refills | Status: DC

## 2019-10-16 DIAGNOSIS — Z20828 Contact with and (suspected) exposure to other viral communicable diseases: Secondary | ICD-10-CM | POA: Diagnosis not present

## 2019-10-19 ENCOUNTER — Ambulatory Visit: Payer: 59

## 2019-10-19 ENCOUNTER — Other Ambulatory Visit (HOSPITAL_COMMUNITY): Payer: Self-pay | Admitting: Family Medicine

## 2019-10-23 DIAGNOSIS — Z23 Encounter for immunization: Secondary | ICD-10-CM | POA: Diagnosis not present

## 2019-11-09 ENCOUNTER — Ambulatory Visit: Admission: RE | Admit: 2019-11-09 | Payer: 59 | Source: Ambulatory Visit

## 2019-11-13 DIAGNOSIS — R202 Paresthesia of skin: Secondary | ICD-10-CM | POA: Diagnosis not present

## 2019-11-13 DIAGNOSIS — R200 Anesthesia of skin: Secondary | ICD-10-CM | POA: Diagnosis not present

## 2019-11-23 ENCOUNTER — Telehealth: Payer: Self-pay | Admitting: Dermatology

## 2019-11-23 MED ORDER — FLUCONAZOLE 150 MG PO TABS: ORAL_TABLET | ORAL | 3 refills | Status: DC

## 2019-11-23 MED FILL — DULOXETINE HCL 60 MG CPEP: 60 | 90 days supply | Qty: 180 | Fill #0

## 2019-11-23 MED FILL — MONTELUKAST SOD 10 MG TAB: 10 | 90 days supply | Qty: 90 | Fill #0

## 2019-11-23 MED FILL — LEVOCETIRIZINE 5 MG TABLET: 5 | 90 days supply | Qty: 90 | Fill #0

## 2019-11-23 NOTE — Telephone Encounter (Signed)
Sent in fluconazole 150 mg tablets to use prn yeast infection associated with antibiotic use per patient request. She has tolerated well in the past with no adverse effects. Advised if signs of yeast infection do not clear after taking the fluconazole, she should seek medical care.

## 2019-12-05 ENCOUNTER — Other Ambulatory Visit: Payer: Self-pay

## 2019-12-05 DIAGNOSIS — L578 Other skin changes due to chronic exposure to nonionizing radiation: Secondary | ICD-10-CM

## 2019-12-05 MED ORDER — IMIQUIMOD 5 % EX CREA: TOPICAL_CREAM | CUTANEOUS | 0 refills | Status: DC

## 2019-12-05 NOTE — Progress Notes (Signed)
RF Request approved per Dr. Nicole Kindred

## 2019-12-06 ENCOUNTER — Other Ambulatory Visit (HOSPITAL_COMMUNITY): Payer: Self-pay | Admitting: Family Medicine

## 2019-12-06 DIAGNOSIS — Z87442 Personal history of urinary calculi: Secondary | ICD-10-CM | POA: Diagnosis not present

## 2019-12-06 DIAGNOSIS — M5136 Other intervertebral disc degeneration, lumbar region: Secondary | ICD-10-CM | POA: Diagnosis not present

## 2019-12-06 DIAGNOSIS — N898 Other specified noninflammatory disorders of vagina: Secondary | ICD-10-CM | POA: Diagnosis not present

## 2019-12-06 DIAGNOSIS — N3020 Other chronic cystitis without hematuria: Secondary | ICD-10-CM | POA: Diagnosis not present

## 2019-12-06 DIAGNOSIS — E7800 Pure hypercholesterolemia, unspecified: Secondary | ICD-10-CM | POA: Diagnosis not present

## 2019-12-06 DIAGNOSIS — R7302 Impaired glucose tolerance (oral): Secondary | ICD-10-CM | POA: Diagnosis not present

## 2019-12-06 DIAGNOSIS — Z0000 Encounter for general adult medical examination without abnormal findings: Secondary | ICD-10-CM | POA: Diagnosis not present

## 2019-12-06 MED FILL — busPIRone HCL 5 MG TABS: 5 | 90 days supply | Qty: 180 | Fill #0

## 2019-12-06 MED FILL — RIZATRIPTAN BENZOATE 10 MG: 10 | 30 days supply | Qty: 18 | Fill #0

## 2019-12-06 MED FILL — MELOXICAM 15 MG TABLET: 15 | 90 days supply | Qty: 90 | Fill #0

## 2019-12-06 MED FILL — OLOPATADINE HCL 0.2 % SOLN: 0.2 | 25 days supply | Qty: 3 | Fill #0

## 2019-12-06 MED FILL — NITROFURANTOIN MONO-MCR 100: 100 | 90 days supply | Qty: 90 | Fill #0

## 2019-12-06 MED FILL — FLOVENT 100 MCG DISKUS: 100 | 90 days supply | Qty: 180 | Fill #0

## 2019-12-06 MED FILL — PHENAZOPYRIDINE 200 MG TAB: 200 | 30 days supply | Qty: 60 | Fill #0

## 2019-12-06 MED FILL — PREMARIN VAGINAL CREAM-APPL: 0.625 | 56 days supply | Qty: 30 | Fill #0

## 2019-12-06 MED FILL — NYSTATIN 100,000 UNIT/GM CR: 100000 | 30 days supply | Qty: 30 | Fill #0

## 2019-12-14 ENCOUNTER — Other Ambulatory Visit (HOSPITAL_COMMUNITY): Payer: Self-pay | Admitting: Gastroenterology

## 2019-12-14 MED FILL — OLOPATADINE HCL 0.2 % SOLN: 0.2 | 25 days supply | Qty: 3 | Fill #0

## 2020-01-24 DIAGNOSIS — Z20828 Contact with and (suspected) exposure to other viral communicable diseases: Secondary | ICD-10-CM | POA: Diagnosis not present

## 2020-01-30 ENCOUNTER — Ambulatory Visit
Admission: RE | Admit: 2020-01-30 | Discharge: 2020-01-30 | Disposition: A | Discharge status: Home / Self Care | Payer: 59 | Source: Ambulatory Visit | Attending: Family Medicine | Admitting: Family Medicine

## 2020-01-30 VITALS — BP 133/86 | HR 96 | Temp 98.4°F | Resp 18

## 2020-01-30 DIAGNOSIS — J209 Acute bronchitis, unspecified: Secondary | ICD-10-CM | POA: Diagnosis not present

## 2020-01-30 MED ORDER — AZITHROMYCIN 250 MG PO TABS: 250.0000 mg | ORAL_TABLET | Freq: Every day | ORAL | 0 refills | Status: DC

## 2020-01-30 MED ORDER — PREDNISONE 10 MG PO TABS: 40.0000 mg | ORAL_TABLET | Freq: Every day | ORAL | 0 refills | Status: AC

## 2020-01-30 MED ORDER — ALBUTEROL SULFATE HFA 108 (90 BASE) MCG/ACT IN AERS: 1.0000 | INHALATION_SPRAY | Freq: Four times a day (QID) | RESPIRATORY_TRACT | 0 refills | Status: DC | PRN

## 2020-01-30 MED ORDER — BENZONATATE 100 MG PO CAPS: 200.0000 mg | ORAL_CAPSULE | Freq: Three times a day (TID) | ORAL | 0 refills | Status: DC

## 2020-01-30 NOTE — ED Triage Notes (Signed)
Pt presents with c/o cough for past week, was just tested for covid at testing site

## 2020-01-30 NOTE — Discharge Instructions (Addendum)
Treating you for bronchitis Take the medication as prescribed Follow up as needed for continued or worsening symptoms  

## 2020-01-31 NOTE — ED Provider Notes (Signed)
Renaldo Fiddler    CSN: [id number] Arrival date & time: 01/30/20  1145      History   Chief Complaint Chief Complaint  Patient presents with  . Fever  . Cough    HPI Bethany Scott is a 65 y.o. female.   Patient is a 65 year old female who presents today for cough, congestion, wheezing.  Symptoms have been constant for the past week.  Using over-the-counter medicines without much relief.  No chest pain or shortness of breath.  Tested for Covid prior to coming here.  No fever, chills     Past Medical History:  Diagnosis Date  . Allergy   . Anemia   . Anxiety   . Arthritis    OA Knee; non-specific autoimmune process followed by [facility]/Rheumatology.  . Asthma   . Blood transfusion without reported diagnosis YRS AGO  . Depression   . Diabetes mellitus without complication (HCC)   . Gastric ulcer, unspecified as acute or chronic, without mention of hemorrhage, perforation, or obstruction YRS AGO  . GERD (gastroesophageal reflux disease)   . Hyperlipidemia   . Hypertension   . Migraine    hx of migraines  . Obesity, unspecified   . Other chronic cystitis   . Personal history of colonic polyps   . PONV (postoperative nausea and vomiting)    NAUSEA, SCOPOLAMINE PATCH HELPED WITH LAST SURGERIES  . Pre-diabetes     Patient Active Problem List   Diagnosis Date Noted  . Status post total hip replacement, 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  . Status post total hip replacement, right 09/27/2017  . Incomplete emptying of bladder 02/16/2017  . Chronic cystitis 02/08/2017  . Hematuria, microscopic 02/08/2017  . History of nephrolithiasis 02/08/2017  . Pelvic pain in female 02/08/2017  . Chronic venous insufficiency 01/06/2017  . Varicose veins of leg with pain, right 12/31/2016  . GERD (gastroesophageal reflux disease) 03/01/2016  . 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 Hip arthritis 05/29/2015  . Degenerative joint disease (DJD) of hip 07/24/2014  . DJD  (degenerative joint disease) of knee 06/17/2014  . Greater trochanteric bursitis of both hips 06/17/2014  . 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 Facet syndrome, lumbar 06/17/2014  . DDD (degenerative disc disease), lumbar 06/17/2014  . Low serum vitamin D 06/08/2013  . Sinusitis, acute, maxillary 02/08/2012  . Migraines 02/08/2012  . Depression with anxiety 02/08/2012  . Essential hypertension, benign 02/08/2012  . Pure hypercholesterolemia 02/08/2012  . Diabetes mellitus type 2 in obese (HCC) 02/08/2012    Past Surgical History:  Procedure Laterality Date  . ABDOMINAL HYSTERECTOMY  01/25/1994   uterine fibroids; DUB; cervical dysplasia; ovaries resected two years later.    . APPENDECTOMY    . BILATERAL OOPHORECTOMY  2001  . CESAREAN SECTION     x 2  . COLON SURGERY  YRS AGO   SIGMOIDECTOMY  . Colonoscopy  09/08/2012   diverticulosis, hemorrhoids.  [name].  Symptoms:  anemia, hemoccult +.  . ESOPHAGOGASTRODUODENOSCOPY  09/08/2012   +HH.  [name].  Symptoms: anemia, hemoccult +.  [name] HIP ARTHROSCOPY Right   . LAPAROSCOPIC GASTRIC SLEEVE RESECTION N/A 03/01/2016   Procedure: LAPAROSCOPIC GASTRIC SLEEVE RESECTION, UPPER ENDO;  Surgeon: Gaynelle Adu, MD;  Location: [facility];  Service: General;  Laterality: N/A;  . LAPAROSCOPIC LYSIS OF ADHESIONS N/A 02/10/2016   Procedure: LAPAROSCOPIC LYSIS OF ADHESIONS;  Surgeon: Gaynelle Adu, MD;  Location: [facility];  Service: General;  Laterality: N/A;  . LAPAROSCOPY N/A 02/10/2016  Procedure: LAPAROSCOPY DIAGNOSTIC;  Surgeon: [name], MD;  Location: [facility];  Service: General;  Laterality: N/A;  . NASAL SEPTUM SURGERY    . NECK SURGERY    . Rheumatology Consult  11/26/2011   multiple arthralgias. Autoimmune labs negative.  Rx for Plaquenil for non-specific autoimmune process.  [name].  [name] SIGMOID RESECTION / RECTOPEXY    . SPINE SURGERY    . TONSILLECTOMY AND ADENOIDECTOMY    . TOTAL HIP ARTHROPLASTY Right 09/27/2017    Procedure: TOTAL HIP ARTHROPLASTY ANTERIOR APPROACH;  Surgeon: [name], MD;  Location: [facility];  Service: Orthopedics;  Laterality: Right;  . TOTAL HIP ARTHROPLASTY Left 12/27/2017   Procedure: TOTAL HIP ARTHROPLASTY ANTERIOR APPROACH;  Surgeon: [name], MD;  Location: [facility];  Service: Orthopedics;  Laterality: Left;    OB History   No obstetric history on file.      Home Medications    Prior to Admission medications   Medication Sig Start Date End Date Taking? Authorizing Provider  albuterol (VENTOLIN HFA) 108 (90 Base) MCG/ACT inhaler Inhale 1-2 puffs into the lungs every 6 (six) hours as needed for wheezing or shortness of breath. 01/30/20  Yes Isel Skufca A, NP  azithromycin (ZITHROMAX) 250 MG tablet Take 1 tablet (250 mg total) by mouth daily. Take first 2 tablets together, then 1 every day until finished. 01/30/20  Yes Ceonna Frazzini A, NP  benzonatate (TESSALON) 100 MG capsule Take 2 capsules (200 mg total) by mouth every 8 (eight) hours. 01/30/20  Yes Addasyn Mcbreen A, NP  predniSONE (DELTASONE) 10 MG tablet Take 4 tablets (40 mg total) by mouth daily for 5 days. 01/30/20 02/04/20 Yes Chrishauna Mee A, NP  ACZONE 7.5 % GEL APPLY TOPICALLY TO THE AFFECTED AREA ON THE SKIN EVERY DAY 03/16/18   [provider]  Azelastine-Fluticasone 137-50 MCG/ACT SUSP Place 2 sprays into the nose 2 (two) times a day. 08/10/18   Darlin Priestly, PA-C  beclomethasone (QVAR) 80 MCG/ACT inhaler Inhale 2 puffs into the lungs daily.     [provider]  Betamethasone Valerate 0.12 % foam  08/29/17   [provider]  cetirizine (ZYRTEC) 10 MG tablet Take 10 mg by mouth 2 (two) times daily.     [provider]  clindamycin (CLEOCIN T) 1 % lotion  11/10/17   [provider]  clindamycin (CLEOCIN) 300 MG capsule Take 2 capsules 1 hour prior to dental procedure. 03/17/18 03/17/29  [provider]  diclofenac sodium (VOLTAREN) 1 % GEL Apply 1 application topically  daily as needed for pain. 07/05/17   [provider]  DULoxetine (CYMBALTA) 60 MG capsule Take 1 capsule (60 mg total) by mouth 2 (two) times daily. 04/30/17   [name], MD  fluconazole (DIFLUCAN) 150 MG tablet Take one tablet as needed for yeast infection due to antibiotic use. Repeat in one week if needed. 11/23/19   Moye, [place], MD  HYDROcodone-acetaminophen (NORCO) 10-325 MG tablet Take 1 tablet by mouth every 6 (six) hours as needed.    [provider]  imiquimod (ALDARA) 5 % cream At bedtime for 2 weeks as tolerated, 2 weeks off and repeat 2 weeks at bedtime. 12/05/19   Brendolyn Patty, MD  ivermectin (STROMECTOL) 3 MG TABS tablet Take 6 tablets by mouth with food for three days 10/02/19   Brendolyn Patty, MD  ivermectin (STROMECTOL) 3 MG TABS tablet Take 6 tablets by mouth daily with food for three days 10/02/19   Brendolyn Patty,  MD  ketoconazole (NIZORAL) 2 % shampoo  12/02/17   [provider]  ketoconazole (NIZORAL) 2 % shampoo APPLY 1 APPLICATION TOPICALLY EVERY OTHER DAY. 07/30/19   Ralene Bathe, MD  methocarbamol (ROBAXIN) 750 MG tablet  02/22/18   [provider]  montelukast (SINGULAIR) 10 MG tablet Take 1 tablet (10 mg total) by mouth daily. 04/30/17   [name], MD  Olopatadine HCl 0.2 % SOLN Apply 1 drop to eye daily. Patient not taking: Reported on 08/10/2018 06/14/17   [name], MD  pantoprazole (PROTONIX) 40 MG tablet TAKE 1 TABLET BY MOUTH TWO TIMES DAILY 08/23/17   [name], MD  PREVIDENT 5000 DRY MOUTH 1.1 % GEL dental gel Place 1 application onto teeth 2 (two) times daily. 08/03/17   [provider]    Family History Family History  Problem Relation Age of Onset  . Bipolar disorder Father   . Transient ischemic attack Father   . Dementia Father        secondary to head trauma  . Hypertension Father   . Diabetes Father   . CAD Father   . Cancer Mother 50       lung  . Migraines Mother   . Hypothyroidism  Mother   . Hypertension Sister   . Atrial fibrillation Sister   . Hypothyroidism Sister   . Alcohol abuse Brother   . Hypertension Brother   . Alcohol abuse Brother   . Atrial fibrillation Maternal Grandmother   . COPD Maternal Grandmother   . Diabetes Maternal Grandmother   . Hyperlipidemia Maternal Grandmother   . Diabetes Maternal Grandfather   . Diabetes Paternal Grandfather   . Heart disease Paternal Grandfather   . Breast cancer Sister 43    Social History Social History   Tobacco Use  . Smoking status: Former Smoker    Packs/day: 1.00    Years: 5.00    Pack years: 5.00  . Smokeless tobacco: Never Used  . Tobacco comment: Quit 32 years ago  Vaping Use  . Vaping Use: Never used  Substance Use Topics  . Alcohol use: Yes    Comment: rare  . Drug use: No     Allergies   Cephalexin, Cephalosporins, and Penicillins   Review of Systems Review of Systems   Physical Exam Triage Vital Signs ED Triage Vitals  Enc Vitals Group     BP 01/30/20 1218 133/86     Pulse Rate 01/30/20 1218 96     Resp 01/30/20 1218 18     Temp 01/30/20 1218 98.4 F (36.9 C)     Temp src --      SpO2 01/30/20 1218 98 %     Weight --      Height --      Head Circumference --      Peak Flow --      Pain Score 01/30/20 1216 3     Pain Loc --      Pain Edu? --      Excl. in [place]? --    No data found.  Updated Vital Signs BP 133/86   Pulse 96   Temp 98.4 F (36.9 C)   Resp 18   SpO2 98%   Visual Acuity Right Eye Distance:   Left Eye Distance:   Bilateral Distance:    Right Eye Near:   Left Eye Near:    Bilateral Near:     Physical Exam Vitals and nursing note reviewed.  Constitutional:      General: She is not in acute distress.    Appearance: Normal appearance. She is not ill-appearing, toxic-appearing or diaphoretic.  HENT:     Head: Normocephalic.     Right Ear: Tympanic membrane and ear canal normal.     Left Ear: Tympanic membrane and ear canal normal.      Nose: Nose normal.     Mouth/Throat:     Pharynx: Oropharynx is clear.  Eyes:     Conjunctiva/sclera: Conjunctivae normal.  Cardiovascular:     Rate and Rhythm: Normal rate and regular rhythm.  Pulmonary:     Effort: Pulmonary effort is normal.     Breath sounds: Wheezing and rhonchi present.  Musculoskeletal:        General: Normal range of motion.     Cervical back: Normal range of motion.  Skin:    General: Skin is warm and dry.     Findings: No rash.  Neurological:     Mental Status: She is alert.  Psychiatric:        Mood and Affect: Mood normal.      UC Treatments / Results  Labs (all labs ordered are listed, but only abnormal results are displayed) Labs Reviewed - No data to display  EKG   Radiology No results found.  Procedures Procedures (including critical care time)  Medications Ordered in UC Medications - No data to display  Initial Impression / Assessment and Plan / UC Course  I have reviewed the triage vital signs and the nursing notes.  Pertinent labs & imaging results that were available during my care of the patient were reviewed by me and considered in my medical decision making (see chart for details).     Acute bronchitis Medications as prescribed to treat Follow up as needed for continued or worsening symptoms  Final Clinical Impressions(s) / UC Diagnoses   Final diagnoses:  Acute bronchitis, unspecified organism     Discharge Instructions     Treating you for bronchitis.  Take the medication as prescribed. Follow up as needed for continued or worsening symptoms    ED Prescriptions    Medication Sig Dispense Auth. Provider   predniSONE (DELTASONE) 10 MG tablet Take 4 tablets (40 mg total) by mouth daily for 5 days. 20 tablet Chaniyah Jahr A, NP   albuterol (VENTOLIN HFA) 108 (90 Base) MCG/ACT inhaler Inhale 1-2 puffs into the lungs every 6 (six) hours as needed for wheezing or shortness of breath. 1 each Maizy Davanzo A, NP    azithromycin (ZITHROMAX) 250 MG tablet Take 1 tablet (250 mg total) by mouth daily. Take first 2 tablets together, then 1 every day until finished. 6 tablet Lucillie Kiesel A, NP   benzonatate (TESSALON) 100 MG capsule Take 2 capsules (200 mg total) by mouth every 8 (eight) hours. 45 capsule Trinia Georgi A, NP     PDMP not reviewed this encounter.   Orvan July, NP 01/31/20 [phone]

## 2020-02-08 ENCOUNTER — Telehealth: Payer: 59 | Admitting: Family

## 2020-02-08 DIAGNOSIS — B9689 Other specified bacterial agents as the cause of diseases classified elsewhere: Secondary | ICD-10-CM | POA: Diagnosis not present

## 2020-02-08 DIAGNOSIS — J019 Acute sinusitis, unspecified: Secondary | ICD-10-CM

## 2020-02-08 MED ORDER — PROMETHAZINE-DM 6.25-15 MG/5ML PO SYRP: 5.0000 mL | ORAL_SOLUTION | Freq: Four times a day (QID) | ORAL | 0 refills | Status: DC | PRN

## 2020-02-08 MED ORDER — LEVOFLOXACIN 500 MG PO TABS: 500.0000 mg | ORAL_TABLET | Freq: Every day | ORAL | 0 refills | Status: DC

## 2020-02-08 MED ORDER — PREDNISONE 20 MG PO TABS: 40.0000 mg | ORAL_TABLET | Freq: Every day | ORAL | 0 refills | Status: DC

## 2020-02-08 NOTE — Progress Notes (Signed)
We are sorry that you are not feeling well.  Here is how we plan to help!  Based on what you have shared with me it looks like you have sinusitis.  Sinusitis is inflammation and infection in the sinus cavities of the head.  Based on your presentation I believe you most likely have Acute Bacterial Sinusitis.  This is an infection caused by bacteria and is treated with antibiotics. I have prescribed Levofloxicin 500mg  by mouth once daily for 7 days. I  Also sent in some Prednisone. You may use an oral decongestant such as Mucinex D or if you have glaucoma or high blood pressure use plain Mucinex. Saline nasal spray help and can safely be used as often as needed for congestion.  If you develop worsening sinus pain, fever or notice severe headache and vision changes, or if symptoms are not better after completion of antibiotic, please schedule an appointment with a health care provider.    Sinus infections are not as easily transmitted as other respiratory infection, however we still recommend that you avoid close contact with loved ones, especially the very young and elderly.  Remember to wash your hands thoroughly throughout the day as this is the number one way to prevent the spread of infection!  Home Care:  Only take medications as instructed by your medical team.  Complete the entire course of an antibiotic.  Do not take these medications with alcohol.  A steam or ultrasonic humidifier can help congestion.  You can place a towel over your head and breathe in the steam from hot water coming from a faucet.  Avoid close contacts especially the very young and the elderly.  Cover your mouth when you cough or sneeze.  Always remember to wash your hands.  Get Help Right Away If:  You develop worsening fever or sinus pain.  You develop a severe head ache or visual changes.  Your symptoms persist after you have completed your treatment plan.  Make sure you  Understand these  instructions.  Will watch your condition.  Will get help right away if you are not doing well or get worse.  Your e-visit answers were reviewed by a board certified advanced clinical practitioner to complete your personal care plan.  Depending on the condition, your plan could have included both over the counter or prescription medications.  If there is a problem please reply  once you have received a response from your provider.  Your safety is important to [place].  If you have drug allergies check your prescription carefully.    You can use MyChart to ask questions about today's visit, request a non-urgent call back, or ask for a work or school excuse for 24 hours related to this e-Visit. If it has been greater than 24 hours you will need to follow up with your provider, or enter a new e-Visit to address those concerns.  You will get an e-mail in the next two days asking about your experience.  I hope that your e-visit has been valuable and will speed your recovery. Thank you for using e-visits.  Greater than 5 minutes, yet less than 10 minutes of time have been spent researching, coordinating, and implementing care for this patient today.  Thank you for the details you included in the comment boxes. Those details are very helpful in determining the best course of treatment for you and help [place] to provide the best care.

## 2020-02-08 NOTE — Addendum Note (Signed)
Addended by: Dutch Quint B on: 02/08/2020 02:16 PM   Modules accepted: Orders

## 2020-02-11 MED FILL — PHENAZOPYRIDINE 200 MG TAB: 200 | 20 days supply | Qty: 60 | Fill #0

## 2020-02-11 MED FILL — NITROFURANTOIN MONO-MCR 100: 100 | 90 days supply | Qty: 90 | Fill #0

## 2020-02-11 MED FILL — FLOVENT 100 MCG DISKUS: 100 | 90 days supply | Qty: 180 | Fill #0

## 2020-02-15 ENCOUNTER — Other Ambulatory Visit (HOSPITAL_COMMUNITY): Payer: Self-pay | Admitting: Family Medicine

## 2020-02-15 DIAGNOSIS — R292 Abnormal reflex: Secondary | ICD-10-CM | POA: Diagnosis not present

## 2020-02-15 DIAGNOSIS — R258 Other abnormal involuntary movements: Secondary | ICD-10-CM | POA: Diagnosis not present

## 2020-02-15 DIAGNOSIS — M5090 Cervical disc disorder, unspecified, unspecified cervical region: Secondary | ICD-10-CM | POA: Diagnosis not present

## 2020-02-15 MED FILL — PROMETHAZINE 25 MG TABLET: 25 | 6 days supply | Qty: 20 | Fill #0

## 2020-02-21 ENCOUNTER — Telehealth: Payer: Self-pay | Admitting: Dermatology

## 2020-02-21 MED ORDER — TRIAMCINOLONE ACETONIDE 0.1 % MT PSTE: PASTE | OROMUCOSAL | 12 refills | Status: DC

## 2020-02-21 NOTE — Telephone Encounter (Signed)
Patient with ulceration at tongue secondary to trauma. Recommend triamcinolone dental paste twice a day to affected area and OTC orajel for pain relief. Sent to [facility].

## 2020-02-28 ENCOUNTER — Other Ambulatory Visit: Payer: Self-pay

## 2020-02-28 DIAGNOSIS — R21 Rash and other nonspecific skin eruption: Secondary | ICD-10-CM

## 2020-02-28 MED ORDER — DOXYCYCLINE MONOHYDRATE 100 MG PO CAPS: 100.0000 mg | ORAL_CAPSULE | Freq: Two times a day (BID) | ORAL | 1 refills | Status: DC

## 2020-02-28 NOTE — Progress Notes (Unsigned)
doxycycl

## 2020-03-04 ENCOUNTER — Ambulatory Visit
Admission: RE | Admit: 2020-03-04 | Discharge: 2020-03-04 | Disposition: A | Discharge status: Home / Self Care | Payer: 59 | Source: Ambulatory Visit | Attending: Family Medicine | Admitting: Family Medicine

## 2020-03-04 ENCOUNTER — Other Ambulatory Visit: Payer: Self-pay

## 2020-03-04 VITALS — BP 146/84 | HR 113 | Temp 98.8°F | Resp 18

## 2020-03-04 DIAGNOSIS — J321 Chronic frontal sinusitis: Secondary | ICD-10-CM | POA: Diagnosis not present

## 2020-03-04 MED ORDER — LEVOFLOXACIN 500 MG PO TABS: 500.0000 mg | ORAL_TABLET | Freq: Every day | ORAL | 0 refills | Status: DC

## 2020-03-04 MED ORDER — BENZONATATE 100 MG PO CAPS: 200.0000 mg | ORAL_CAPSULE | Freq: Three times a day (TID) | ORAL | 0 refills | Status: DC

## 2020-03-04 NOTE — ED Provider Notes (Signed)
Roderic Palau    CSN: [id number] Arrival date & time: 03/04/20  1354      History   Chief Complaint Chief Complaint  Patient presents with  . Fever  . Cough    HPI Bethany Scott is a 65 y.o. female.   Patient is a 65 year old female presents today with recurrent sinus infections, cough, upper respiratory symptoms.  This episode started last Thursday.  Was treated for similar symptoms approximate 1 month ago.  Reported symptoms got mildly better but never fully went away. She has been very fatigued. Had negative covid PCR. Taking mucinex, albuterol, tessalon pearls, zyrtec.      Past Medical History:  Diagnosis Date  . Allergy   . Anemia   . Anxiety   . Arthritis    OA Knee; non-specific autoimmune process followed by [facility]/Rheumatology.  . Asthma   . Blood transfusion without reported diagnosis YRS AGO  . COVID-19 03/2018  . Depression   . Diabetes mellitus without complication ([place])   . Gastric ulcer, unspecified as acute or chronic, without mention of hemorrhage, perforation, or obstruction YRS AGO  . GERD (gastroesophageal reflux disease)   . Hyperlipidemia   . Hypertension   . Migraine    hx of migraines  . Obesity, unspecified   . Other chronic cystitis   . Personal history of colonic polyps   . PONV (postoperative nausea and vomiting)    NAUSEA, SCOPOLAMINE PATCH HELPED WITH LAST SURGERIES  . Pre-diabetes     Patient Active Problem List   Diagnosis Date Noted  . Status post total hip replacement, 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  . Status post total hip replacement, right 09/27/2017  . Incomplete emptying of bladder 02/16/2017  . Chronic cystitis 02/08/2017  . Hematuria, microscopic 02/08/2017  . History of nephrolithiasis 02/08/2017  . Pelvic pain in female 02/08/2017  . Chronic venous insufficiency 01/06/2017  . Varicose veins of leg with pain, right 12/31/2016  . GERD (gastroesophageal reflux disease) 03/01/2016  . 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 Hip arthritis 05/29/2015  . Degenerative joint disease (DJD) of hip 07/24/2014  . DJD (degenerative joint disease) of knee 06/17/2014  . Greater trochanteric bursitis of both hips 06/17/2014  . Degenerative joint disease of sacroiliac joint ([place]) 06/17/2014  . Facet syndrome, lumbar 06/17/2014  . DDD (degenerative disc disease), lumbar 06/17/2014  . Low serum vitamin D 06/08/2013  . Sinusitis, acute, maxillary 02/08/2012  . Migraines 02/08/2012  . Depression with anxiety 02/08/2012  . Essential hypertension, benign 02/08/2012  . Pure hypercholesterolemia 02/08/2012  . Diabetes mellitus type 2 in obese ([place]) 02/08/2012    Past Surgical History:  Procedure Laterality Date  . ABDOMINAL HYSTERECTOMY  01/25/1994   uterine fibroids; DUB; cervical dysplasia; ovaries resected two years later.    . APPENDECTOMY    . BILATERAL OOPHORECTOMY  2001  . CESAREAN SECTION     x 2  . COLON SURGERY  YRS AGO   SIGMOIDECTOMY  . Colonoscopy  09/08/2012   diverticulosis, hemorrhoids.  [name].  Symptoms:  anemia, hemoccult +.  . ESOPHAGOGASTRODUODENOSCOPY  09/08/2012   +HH.  [name].  Symptoms: anemia, hemoccult +.  [name] HIP ARTHROSCOPY Right   . LAPAROSCOPIC GASTRIC SLEEVE RESECTION N/A 03/01/2016   Procedure: LAPAROSCOPIC GASTRIC SLEEVE RESECTION, UPPER ENDO;  Surgeon: [name], MD;  Location: [facility];  Service: General;  Laterality: N/A;  . LAPAROSCOPIC LYSIS OF ADHESIONS N/A 02/10/2016   Procedure: LAPAROSCOPIC LYSIS OF ADHESIONS;  Surgeon: [name], MD;  Location: [facility];  Service: General;  Laterality: N/A;  . LAPAROSCOPY N/A 02/10/2016   Procedure: LAPAROSCOPY DIAGNOSTIC;  Surgeon: [name], MD;  Location: [facility];  Service: General;  Laterality: N/A;  . NASAL SEPTUM SURGERY    . NECK SURGERY    . Rheumatology Consult  11/26/2011   multiple arthralgias. Autoimmune labs negative.  Rx for Plaquenil for non-specific autoimmune process.  [name].  [name] SIGMOID RESECTION  / RECTOPEXY    . SPINE SURGERY    . TONSILLECTOMY AND ADENOIDECTOMY    . TOTAL HIP ARTHROPLASTY Right 09/27/2017   Procedure: TOTAL HIP ARTHROPLASTY ANTERIOR APPROACH;  Surgeon: [name], MD;  Location: [facility];  Service: Orthopedics;  Laterality: Right;  . TOTAL HIP ARTHROPLASTY Left 12/27/2017   Procedure: TOTAL HIP ARTHROPLASTY ANTERIOR APPROACH;  Surgeon: [name], MD;  Location: [facility];  Service: Orthopedics;  Laterality: Left;    OB History   No obstetric history on file.      Home Medications    Prior to Admission medications   Medication Sig Start Date End Date Taking? Authorizing Provider  ACZONE 7.5 % GEL APPLY TOPICALLY TO THE AFFECTED AREA ON THE SKIN EVERY DAY 03/16/18  Yes [provider]  albuterol (VENTOLIN HFA) 108 (90 Base) MCG/ACT inhaler Inhale 1-2 puffs into the lungs every 6 (six) hours as needed for wheezing or shortness of breath. 01/30/20  Yes Decorian Schuenemann A, NP  Azelastine-Fluticasone 137-50 MCG/ACT SUSP Place 2 sprays into the nose 2 (two) times a day. 08/10/18  Yes Darlin Priestly, PA-C  benzonatate (TESSALON) 100 MG capsule Take 2 capsules (200 mg total) by mouth every 8 (eight) hours. 03/04/20  Yes Akasia Ahmad A, NP  cetirizine (ZYRTEC) 10 MG tablet Take 10 mg by mouth 2 (two) times daily.    Yes [provider]  fluconazole (DIFLUCAN) 150 MG tablet Take one tablet as needed for yeast infection due to antibiotic use. Repeat in one week if needed. 11/23/19  Yes Moye, [place], MD  HYDROcodone-acetaminophen (NORCO) 10-325 MG tablet Take 1 tablet by mouth every 6 (six) hours as needed.   Yes [provider]  ivermectin (STROMECTOL) 3 MG TABS tablet Take 6 tablets by mouth with food for three days 10/02/19  Yes Brendolyn Patty, MD  ivermectin (STROMECTOL) 3 MG TABS tablet Take 6 tablets by mouth daily with food for three days 10/02/19  Yes Brendolyn Patty, MD  levofloxacin (LEVAQUIN) 500 MG tablet Take 1 tablet (500 mg total) by mouth  daily. 03/04/20  Yes Deondra Wigger A, NP  beclomethasone (QVAR) 80 MCG/ACT inhaler Inhale 2 puffs into the lungs daily.     [provider]  Betamethasone Valerate 0.12 % foam  08/29/17   [provider]  clindamycin (CLEOCIN T) 1 % lotion  11/10/17   [provider]  diclofenac sodium (VOLTAREN) 1 % GEL Apply 1 application topically daily as needed for pain. 07/05/17   [provider]  DULoxetine (CYMBALTA) 60 MG capsule Take 1 capsule (60 mg total) by mouth 2 (two) times daily. 04/30/17   [name], MD  imiquimod [place]) 5 % cream At bedtime for 2 weeks as tolerated, 2 weeks off and repeat 2 weeks at bedtime. 12/05/19   Brendolyn Patty, MD  ketoconazole (NIZORAL) 2 % shampoo  12/02/17   [provider]  ketoconazole (NIZORAL) 2 % shampoo APPLY 1 APPLICATION TOPICALLY EVERY OTHER DAY. 07/30/19   Ralene Bathe, MD  methocarbamol (ROBAXIN) 750 MG tablet  02/22/18   [provider]  montelukast (SINGULAIR) 10 MG tablet Take 1 tablet (10 mg total) by mouth daily. 04/30/17   [name], MD  Olopatadine HCl 0.2 % SOLN Apply 1 drop to eye daily. Patient not taking: Reported on 08/10/2018 06/14/17   [name], MD  pantoprazole (PROTONIX) 40 MG tablet TAKE 1 TABLET BY MOUTH TWO TIMES DAILY 08/23/17   [name], MD  PREVIDENT 5000 DRY MOUTH 1.1 % GEL dental gel Place 1 application onto teeth 2 (two) times daily. 08/03/17   [provider]  promethazine-dextromethorphan (PROMETHAZINE-DM) 6.25-15 MG/5ML syrup Take 5 mLs by mouth 4 (four) times daily as needed. 02/08/20   Kennyth Arnold, FNP  triamcinolone (KENALOG) 0.1 % paste Dry ulcer with tissue and then apply paste to affected area twice a day until no longer tender. 02/21/20   Alfonso Patten, MD    Family History Family History  Problem Relation Age of Onset  . Bipolar disorder Father   . Transient ischemic attack Father   . Dementia Father        secondary to head trauma  .  Hypertension Father   . Diabetes Father   . CAD Father   . Cancer Mother 16       lung  . Migraines Mother   . Hypothyroidism Mother   . Hypertension Sister   . Atrial fibrillation Sister   . Hypothyroidism Sister   . Alcohol abuse Brother   . Hypertension Brother   . Alcohol abuse Brother   . Atrial fibrillation Maternal Grandmother   . COPD Maternal Grandmother   . Diabetes Maternal Grandmother   . Hyperlipidemia Maternal Grandmother   . Diabetes Maternal Grandfather   . Diabetes Paternal Grandfather   . Heart disease Paternal Grandfather   . Breast cancer Sister 2    Social History Social History   Tobacco Use  . Smoking status: Former Smoker    Packs/day: 1.00    Years: 5.00    Pack years: 5.00  . Smokeless tobacco: Never Used  . Tobacco comment: Quit 32 years ago  Vaping Use  . Vaping Use: Never used  Substance Use Topics  . Alcohol use: Yes    Comment: rare  . Drug use: No     Allergies   Cephalexin, Cephalosporins, and Penicillins   Review of Systems Review of Systems   Physical Exam Triage Vital Signs ED Triage Vitals  Enc Vitals Group     BP 03/04/20 1403 (!) 146/84     Pulse Rate 03/04/20 1403 (!) 113     Resp 03/04/20 1403 18     Temp 03/04/20 1403 98.8 F (37.1 C)     Temp Source 03/04/20 1403 Oral     SpO2 03/04/20 1403 95 %     Weight --      Height --      Head Circumference --      Peak Flow --      Pain Score 03/04/20 1406 3     Pain Loc --      Pain Edu? --      Excl. in [place]? --    No data found.  Updated Vital Signs BP (!) 146/84 (BP Location: Left Arm)   Pulse (!) 113   Temp 98.8 F (37.1 C) (Oral)   Resp 18   SpO2 95%   Visual Acuity Right Eye Distance:   Left Eye Distance:   Bilateral Distance:    Right  Eye Near:   Left Eye Near:    Bilateral Near:     Physical Exam Vitals and nursing note reviewed.  Constitutional:      General: She is not in acute distress.    Appearance: Normal appearance. She is not  ill-appearing, toxic-appearing or diaphoretic.  HENT:     Head: Normocephalic.     Right Ear: Tympanic membrane and ear canal normal.     Left Ear: Tympanic membrane and ear canal normal.     Nose: Congestion present.     Mouth/Throat:     Pharynx: Oropharynx is clear.  Eyes:     Conjunctiva/sclera: Conjunctivae normal.  Cardiovascular:     Rate and Rhythm: Normal rate and regular rhythm.  Pulmonary:     Effort: Pulmonary effort is normal.     Breath sounds: Normal breath sounds.  Musculoskeletal:        General: Normal range of motion.     Cervical back: Normal range of motion.  Skin:    General: Skin is warm and dry.     Findings: No rash.  Neurological:     Mental Status: She is alert.  Psychiatric:        Mood and Affect: Mood normal.      UC Treatments / Results  Labs (all labs ordered are listed, but only abnormal results are displayed) Labs Reviewed - No data to display  EKG   Radiology No results found.  Procedures Procedures (including critical care time)  Medications Ordered in UC Medications - No data to display  Initial Impression / Assessment and Plan / UC Course  I have reviewed the triage vital signs and the nursing notes.  Pertinent labs & imaging results that were available during my care of the patient were reviewed by me and considered in my medical decision making (see chart for details).     Chronic sinusitis Pt reporting that no other abx work for her except for Levaquin and Biaxin. Discussed the repeat use of abx and risk of resistance. Also the possible adverse effects of the Levaquin.  Will treat for URI and sinusitis today.  Continue the OTC meds as needed.  Follow up as needed for continued or worsening symptoms  Final Clinical Impressions(s) / UC Diagnoses   Final diagnoses:  Chronic frontal sinusitis     Discharge Instructions     Take the antibiotics as prescribed Continue the mucinex and OTC medicines as  needed Follow up as needed for continued or worsening symptoms    ED Prescriptions    Medication Sig Dispense Auth. Provider   levofloxacin (LEVAQUIN) 500 MG tablet Take 1 tablet (500 mg total) by mouth daily. 7 tablet Kalleigh Harbor A, NP   benzonatate (TESSALON) 100 MG capsule Take 2 capsules (200 mg total) by mouth every 8 (eight) hours. 45 capsule Suede Greenawalt A, NP     PDMP not reviewed this encounter.   Loura Halt A, NP 03/05/20 [phone]

## 2020-03-04 NOTE — Discharge Instructions (Addendum)
Take the antibiotics as prescribed Continue the mucinex and OTC medicines as needed Follow up as needed for continued or worsening symptoms

## 2020-03-04 NOTE — ED Triage Notes (Addendum)
Pt presents today with c/o cough, pharyngitis, low back pain, running nose, low grade temp. Advil taken pta. She was recently treated for Bronchitis. Received negative Covid test results today.

## 2020-03-13 MED FILL — DULOXETINE HCL 60 MG CPEP: 60 | 90 days supply | Qty: 180 | Fill #0

## 2020-03-13 MED FILL — DEXLANSOPRAZOLE 60 MG CPDR: 60 | 90 days supply | Qty: 90 | Fill #0

## 2020-03-13 MED FILL — FAMOTIDINE 40 MG TABS: 40 | 90 days supply | Qty: 90 | Fill #0

## 2020-03-13 MED FILL — MONTELUKAST SOD 10 MG TAB: 10 | 90 days supply | Qty: 90 | Fill #0

## 2020-03-14 DIAGNOSIS — M189 Osteoarthritis of first carpometacarpal joint, unspecified: Secondary | ICD-10-CM | POA: Diagnosis not present

## 2020-03-28 ENCOUNTER — Other Ambulatory Visit (HOSPITAL_COMMUNITY): Payer: Self-pay | Admitting: Allergy and Immunology

## 2020-03-28 DIAGNOSIS — J301 Allergic rhinitis due to pollen: Secondary | ICD-10-CM | POA: Diagnosis not present

## 2020-03-28 DIAGNOSIS — K219 Gastro-esophageal reflux disease without esophagitis: Secondary | ICD-10-CM | POA: Diagnosis not present

## 2020-03-28 DIAGNOSIS — J4540 Moderate persistent asthma, uncomplicated: Secondary | ICD-10-CM | POA: Diagnosis not present

## 2020-03-28 DIAGNOSIS — B999 Unspecified infectious disease: Secondary | ICD-10-CM | POA: Diagnosis not present

## 2020-04-15 ENCOUNTER — Other Ambulatory Visit (HOSPITAL_BASED_OUTPATIENT_CLINIC_OR_DEPARTMENT_OTHER): Payer: Self-pay

## 2020-04-18 ENCOUNTER — Other Ambulatory Visit: Payer: Self-pay

## 2020-04-18 ENCOUNTER — Ambulatory Visit
Admission: RE | Admit: 2020-04-18 | Discharge: 2020-04-18 | Disposition: A | Discharge status: Home / Self Care | Payer: 59 | Source: Ambulatory Visit | Attending: Neurology | Admitting: Neurology

## 2020-04-18 DIAGNOSIS — R531 Weakness: Secondary | ICD-10-CM | POA: Diagnosis not present

## 2020-04-18 DIAGNOSIS — M5124 Other intervertebral disc displacement, thoracic region: Secondary | ICD-10-CM | POA: Diagnosis not present

## 2020-04-18 DIAGNOSIS — R292 Abnormal reflex: Secondary | ICD-10-CM | POA: Diagnosis not present

## 2020-04-18 DIAGNOSIS — M47814 Spondylosis without myelopathy or radiculopathy, thoracic region: Secondary | ICD-10-CM | POA: Diagnosis not present

## 2020-04-18 MED ORDER — GADOBUTROL 1 MMOL/ML IV SOLN
9.0000 mL | Freq: Once | INTRAVENOUS | Status: AC | PRN
  Administered 2020-04-18: 9 mL via INTRAVENOUS

## 2020-04-25 DIAGNOSIS — Z23 Encounter for immunization: Secondary | ICD-10-CM | POA: Diagnosis not present

## 2020-04-29 ENCOUNTER — Other Ambulatory Visit: Payer: Self-pay

## 2020-04-29 DIAGNOSIS — L219 Seborrheic dermatitis, unspecified: Secondary | ICD-10-CM

## 2020-04-29 MED ORDER — HYDROCORTISONE 2.5 % EX CREA: TOPICAL_CREAM | CUTANEOUS | 0 refills | Status: DC

## 2020-04-30 DIAGNOSIS — M47819 Spondylosis without myelopathy or radiculopathy, site unspecified: Secondary | ICD-10-CM | POA: Diagnosis not present

## 2020-04-30 DIAGNOSIS — M48061 Spinal stenosis, lumbar region without neurogenic claudication: Secondary | ICD-10-CM | POA: Diagnosis not present

## 2020-04-30 DIAGNOSIS — F418 Other specified anxiety disorders: Secondary | ICD-10-CM | POA: Diagnosis not present

## 2020-04-30 DIAGNOSIS — R7302 Impaired glucose tolerance (oral): Secondary | ICD-10-CM | POA: Diagnosis not present

## 2020-04-30 DIAGNOSIS — R0600 Dyspnea, unspecified: Secondary | ICD-10-CM | POA: Diagnosis not present

## 2020-04-30 DIAGNOSIS — M5136 Other intervertebral disc degeneration, lumbar region: Secondary | ICD-10-CM | POA: Diagnosis not present

## 2020-04-30 DIAGNOSIS — M5441 Lumbago with sciatica, right side: Secondary | ICD-10-CM | POA: Diagnosis not present

## 2020-04-30 DIAGNOSIS — N3020 Other chronic cystitis without hematuria: Secondary | ICD-10-CM | POA: Diagnosis not present

## 2020-04-30 DIAGNOSIS — G894 Chronic pain syndrome: Secondary | ICD-10-CM | POA: Diagnosis not present

## 2020-04-30 DIAGNOSIS — M4316 Spondylolisthesis, lumbar region: Secondary | ICD-10-CM | POA: Diagnosis not present

## 2020-04-30 DIAGNOSIS — M5442 Lumbago with sciatica, left side: Secondary | ICD-10-CM | POA: Diagnosis not present

## 2020-05-20 ENCOUNTER — Other Ambulatory Visit (HOSPITAL_COMMUNITY): Payer: Self-pay

## 2020-05-20 MED ORDER — CARESTART COVID-19 HOME TEST VI KIT
PACK | 0 refills | Status: DC
  Filled 2020-05-20: qty 4, 30d supply, fill #0

## 2020-05-20 MED FILL — Budesonide-Formoterol Fumarate Dihyd Aerosol 160-4.5 MCG/ACT: RESPIRATORY_TRACT | 60 days supply | Qty: 20.4 | Fill #0 | Status: AC

## 2020-05-26 ENCOUNTER — Other Ambulatory Visit: Payer: Self-pay

## 2020-05-26 DIAGNOSIS — L219 Seborrheic dermatitis, unspecified: Secondary | ICD-10-CM

## 2020-05-26 MED ORDER — MUPIROCIN 2 % EX OINT: 1.0000 "application " | TOPICAL_OINTMENT | Freq: Two times a day (BID) | CUTANEOUS | 0 refills | Status: DC

## 2020-05-26 MED ORDER — HYDROCORTISONE 2.5 % EX CREA: TOPICAL_CREAM | CUTANEOUS | 0 refills | Status: DC

## 2020-05-26 MED ORDER — DOXYCYCLINE MONOHYDRATE 100 MG PO CAPS: ORAL_CAPSULE | ORAL | 0 refills | Status: DC

## 2020-06-02 ENCOUNTER — Other Ambulatory Visit (HOSPITAL_COMMUNITY): Payer: Self-pay

## 2020-06-02 DIAGNOSIS — Z630 Problems in relationship with spouse or partner: Secondary | ICD-10-CM | POA: Diagnosis not present

## 2020-06-02 DIAGNOSIS — F430 Acute stress reaction: Secondary | ICD-10-CM | POA: Diagnosis not present

## 2020-06-02 DIAGNOSIS — F418 Other specified anxiety disorders: Secondary | ICD-10-CM | POA: Diagnosis not present

## 2020-06-02 MED ORDER — BUSPIRONE HCL 10 MG PO TABS
10.0000 mg | ORAL_TABLET | Freq: Three times a day (TID) | ORAL | 3 refills | Status: DC
  Filled 2020-06-02: qty 270, 90d supply, fill #0
  Filled 2020-08-27: qty 270, 90d supply, fill #1

## 2020-06-03 ENCOUNTER — Other Ambulatory Visit (HOSPITAL_COMMUNITY): Payer: Self-pay

## 2020-06-03 MED ORDER — BUSPIRONE HCL 5 MG PO TABS: 5.0000 mg | ORAL_TABLET | Freq: Two times a day (BID) | ORAL | 0 refills | Status: DC

## 2020-06-04 DIAGNOSIS — Z20828 Contact with and (suspected) exposure to other viral communicable diseases: Secondary | ICD-10-CM | POA: Diagnosis not present

## 2020-06-13 ENCOUNTER — Other Ambulatory Visit (HOSPITAL_COMMUNITY): Payer: Self-pay

## 2020-06-13 MED ORDER — FAMOTIDINE 40 MG PO TABS
ORAL_TABLET | ORAL | 1 refills | Status: DC
  Filled 2020-06-13: qty 90, 90d supply, fill #0
  Filled 2020-09-15: qty 90, 90d supply, fill #1

## 2020-06-13 MED ORDER — DEXLANSOPRAZOLE 60 MG PO CPDR
DELAYED_RELEASE_CAPSULE | ORAL | 1 refills | Status: DC
  Filled 2020-06-13: qty 90, 90d supply, fill #0
  Filled 2020-09-15: qty 90, 90d supply, fill #1

## 2020-06-16 ENCOUNTER — Other Ambulatory Visit (HOSPITAL_COMMUNITY): Payer: Self-pay

## 2020-06-16 MED FILL — Montelukast Sodium Tab 10 MG (Base Equiv): ORAL | 90 days supply | Qty: 90 | Fill #0 | Status: AC

## 2020-06-16 MED FILL — Levocetirizine Dihydrochloride Tab 5 MG: ORAL | 90 days supply | Qty: 90 | Fill #0 | Status: AC

## 2020-06-16 MED FILL — Duloxetine HCl Enteric Coated Pellets Cap 60 MG (Base Eq): ORAL | 90 days supply | Qty: 180 | Fill #0 | Status: AC

## 2020-06-17 ENCOUNTER — Telehealth: Payer: Self-pay | Admitting: Dermatology

## 2020-06-18 MED ORDER — NYSTATIN 100000 UNIT/GM EX CREA: TOPICAL_CREAM | CUTANEOUS | 3 refills | Status: DC

## 2020-06-18 NOTE — Telephone Encounter (Signed)
Patient requested refill of Nystatin cream. Sent to pharmacy.

## 2020-06-19 ENCOUNTER — Other Ambulatory Visit: Payer: Self-pay

## 2020-06-19 ENCOUNTER — Other Ambulatory Visit: Payer: Self-pay | Admitting: Dermatology

## 2020-06-19 ENCOUNTER — Other Ambulatory Visit: Payer: Self-pay | Admitting: Family

## 2020-06-19 DIAGNOSIS — L578 Other skin changes due to chronic exposure to nonionizing radiation: Secondary | ICD-10-CM

## 2020-06-19 DIAGNOSIS — L219 Seborrheic dermatitis, unspecified: Secondary | ICD-10-CM

## 2020-06-19 MED ORDER — AZITHROMYCIN 250 MG PO TABS: ORAL_TABLET | ORAL | 1 refills | Status: DC

## 2020-06-19 NOTE — Progress Notes (Signed)
Azithromycin 250 mg sent in to [facility]

## 2020-06-24 ENCOUNTER — Other Ambulatory Visit (HOSPITAL_COMMUNITY): Payer: Self-pay

## 2020-06-30 ENCOUNTER — Other Ambulatory Visit (HOSPITAL_COMMUNITY): Payer: Self-pay

## 2020-07-19 DIAGNOSIS — S8262XA Displaced fracture of lateral malleolus of left fibula, initial encounter for closed fracture: Secondary | ICD-10-CM | POA: Diagnosis not present

## 2020-07-19 DIAGNOSIS — S92352A Displaced fracture of fifth metatarsal bone, left foot, initial encounter for closed fracture: Secondary | ICD-10-CM | POA: Diagnosis not present

## 2020-07-21 DIAGNOSIS — S8265XA Nondisplaced fracture of lateral malleolus of left fibula, initial encounter for closed fracture: Secondary | ICD-10-CM | POA: Diagnosis not present

## 2020-07-21 DIAGNOSIS — S92355A Nondisplaced fracture of fifth metatarsal bone, left foot, initial encounter for closed fracture: Secondary | ICD-10-CM | POA: Diagnosis not present

## 2020-07-21 DIAGNOSIS — S93402A Sprain of unspecified ligament of left ankle, initial encounter: Secondary | ICD-10-CM | POA: Diagnosis not present

## 2020-07-21 DIAGNOSIS — M25372 Other instability, left ankle: Secondary | ICD-10-CM | POA: Diagnosis not present

## 2020-07-30 DIAGNOSIS — S92355D Nondisplaced fracture of fifth metatarsal bone, left foot, subsequent encounter for fracture with routine healing: Secondary | ICD-10-CM | POA: Diagnosis not present

## 2020-07-30 DIAGNOSIS — S8265XD Nondisplaced fracture of lateral malleolus of left fibula, subsequent encounter for closed fracture with routine healing: Secondary | ICD-10-CM | POA: Diagnosis not present

## 2020-07-30 DIAGNOSIS — F430 Acute stress reaction: Secondary | ICD-10-CM | POA: Diagnosis not present

## 2020-07-30 DIAGNOSIS — G894 Chronic pain syndrome: Secondary | ICD-10-CM | POA: Diagnosis not present

## 2020-08-08 DIAGNOSIS — M79672 Pain in left foot: Secondary | ICD-10-CM | POA: Diagnosis not present

## 2020-08-08 DIAGNOSIS — S93402D Sprain of unspecified ligament of left ankle, subsequent encounter: Secondary | ICD-10-CM | POA: Diagnosis not present

## 2020-08-08 DIAGNOSIS — S8265XD Nondisplaced fracture of lateral malleolus of left fibula, subsequent encounter for closed fracture with routine healing: Secondary | ICD-10-CM | POA: Diagnosis not present

## 2020-08-08 DIAGNOSIS — S92355D Nondisplaced fracture of fifth metatarsal bone, left foot, subsequent encounter for fracture with routine healing: Secondary | ICD-10-CM | POA: Diagnosis not present

## 2020-08-08 DIAGNOSIS — M25572 Pain in left ankle and joints of left foot: Secondary | ICD-10-CM | POA: Diagnosis not present

## 2020-08-12 ENCOUNTER — Other Ambulatory Visit (HOSPITAL_COMMUNITY): Payer: Self-pay

## 2020-08-12 MED ORDER — NITROFURANTOIN MONOHYD MACRO 100 MG PO CAPS
ORAL_CAPSULE | ORAL | 0 refills | Status: DC
  Filled 2020-08-12: qty 90, 90d supply, fill #0

## 2020-08-20 ENCOUNTER — Other Ambulatory Visit (HOSPITAL_COMMUNITY): Payer: Self-pay

## 2020-08-20 MED FILL — Budesonide-Formoterol Fumarate Dihyd Aerosol 160-4.5 MCG/ACT: RESPIRATORY_TRACT | 60 days supply | Qty: 20.4 | Fill #1 | Status: AC

## 2020-08-27 ENCOUNTER — Other Ambulatory Visit (HOSPITAL_COMMUNITY): Payer: Self-pay

## 2020-08-29 ENCOUNTER — Other Ambulatory Visit (HOSPITAL_COMMUNITY): Payer: Self-pay

## 2020-08-29 DIAGNOSIS — M25572 Pain in left ankle and joints of left foot: Secondary | ICD-10-CM | POA: Diagnosis not present

## 2020-08-29 DIAGNOSIS — M79672 Pain in left foot: Secondary | ICD-10-CM | POA: Diagnosis not present

## 2020-08-29 DIAGNOSIS — M7672 Peroneal tendinitis, left leg: Secondary | ICD-10-CM | POA: Diagnosis not present

## 2020-08-29 DIAGNOSIS — S92355D Nondisplaced fracture of fifth metatarsal bone, left foot, subsequent encounter for fracture with routine healing: Secondary | ICD-10-CM | POA: Diagnosis not present

## 2020-08-29 DIAGNOSIS — S8265XD Nondisplaced fracture of lateral malleolus of left fibula, subsequent encounter for closed fracture with routine healing: Secondary | ICD-10-CM | POA: Diagnosis not present

## 2020-08-29 MED FILL — Gabapentin Cap 300 MG: ORAL | 90 days supply | Qty: 810 | Fill #0 | Status: AC

## 2020-09-08 DIAGNOSIS — R17 Unspecified jaundice: Secondary | ICD-10-CM | POA: Diagnosis not present

## 2020-09-15 ENCOUNTER — Other Ambulatory Visit (HOSPITAL_COMMUNITY): Payer: Self-pay

## 2020-09-15 MED FILL — Montelukast Sodium Tab 10 MG (Base Equiv): ORAL | 90 days supply | Qty: 90 | Fill #1 | Status: AC

## 2020-09-15 MED FILL — Duloxetine HCl Enteric Coated Pellets Cap 60 MG (Base Eq): ORAL | 90 days supply | Qty: 180 | Fill #1 | Status: AC

## 2020-09-17 ENCOUNTER — Inpatient Hospital Stay
Admission: EM | Admit: 2020-09-17 | Discharge: 2020-09-21 | DRG: 871 | Disposition: A | Discharge status: Home / Self Care | Payer: 59 | Source: Ambulatory Visit | Attending: Internal Medicine | Admitting: Internal Medicine

## 2020-09-17 ENCOUNTER — Emergency Department: Payer: 59

## 2020-09-17 ENCOUNTER — Encounter: Payer: Self-pay | Admitting: Emergency Medicine

## 2020-09-17 ENCOUNTER — Other Ambulatory Visit: Payer: Self-pay

## 2020-09-17 DIAGNOSIS — Z96641 Presence of right artificial hip joint: Secondary | ICD-10-CM | POA: Diagnosis not present

## 2020-09-17 DIAGNOSIS — N3090 Cystitis, unspecified without hematuria: Secondary | ICD-10-CM | POA: Diagnosis not present

## 2020-09-17 DIAGNOSIS — Z818 Family history of other mental and behavioral disorders: Secondary | ICD-10-CM

## 2020-09-17 DIAGNOSIS — Z20822 Contact with and (suspected) exposure to covid-19: Secondary | ICD-10-CM | POA: Diagnosis not present

## 2020-09-17 DIAGNOSIS — Z6838 Body mass index (BMI) 38.0-38.9, adult: Secondary | ICD-10-CM

## 2020-09-17 DIAGNOSIS — Z811 Family history of alcohol abuse and dependence: Secondary | ICD-10-CM

## 2020-09-17 DIAGNOSIS — F32A Depression, unspecified: Secondary | ICD-10-CM | POA: Diagnosis present

## 2020-09-17 DIAGNOSIS — E1142 Type 2 diabetes mellitus with diabetic polyneuropathy: Secondary | ICD-10-CM | POA: Diagnosis present

## 2020-09-17 DIAGNOSIS — G8929 Other chronic pain: Secondary | ICD-10-CM | POA: Diagnosis present

## 2020-09-17 DIAGNOSIS — Z9884 Bariatric surgery status: Secondary | ICD-10-CM

## 2020-09-17 DIAGNOSIS — M169 Osteoarthritis of hip, unspecified: Secondary | ICD-10-CM | POA: Diagnosis present

## 2020-09-17 DIAGNOSIS — Z83438 Family history of other disorder of lipoprotein metabolism and other lipidemia: Secondary | ICD-10-CM

## 2020-09-17 DIAGNOSIS — Z87891 Personal history of nicotine dependence: Secondary | ICD-10-CM

## 2020-09-17 DIAGNOSIS — J45909 Unspecified asthma, uncomplicated: Secondary | ICD-10-CM | POA: Diagnosis present

## 2020-09-17 DIAGNOSIS — Z8616 Personal history of COVID-19: Secondary | ICD-10-CM

## 2020-09-17 DIAGNOSIS — E7800 Pure hypercholesterolemia, unspecified: Secondary | ICD-10-CM | POA: Diagnosis not present

## 2020-09-17 DIAGNOSIS — Z803 Family history of malignant neoplasm of breast: Secondary | ICD-10-CM | POA: Diagnosis not present

## 2020-09-17 DIAGNOSIS — E669 Obesity, unspecified: Secondary | ICD-10-CM | POA: Diagnosis present

## 2020-09-17 DIAGNOSIS — Z880 Allergy status to penicillin: Secondary | ICD-10-CM

## 2020-09-17 DIAGNOSIS — F418 Other specified anxiety disorders: Secondary | ICD-10-CM

## 2020-09-17 DIAGNOSIS — F419 Anxiety disorder, unspecified: Secondary | ICD-10-CM | POA: Diagnosis present

## 2020-09-17 DIAGNOSIS — R000 Tachycardia, unspecified: Secondary | ICD-10-CM | POA: Diagnosis not present

## 2020-09-17 DIAGNOSIS — R0602 Shortness of breath: Secondary | ICD-10-CM | POA: Diagnosis not present

## 2020-09-17 DIAGNOSIS — Z8249 Family history of ischemic heart disease and other diseases of the circulatory system: Secondary | ICD-10-CM

## 2020-09-17 DIAGNOSIS — R0902 Hypoxemia: Secondary | ICD-10-CM

## 2020-09-17 DIAGNOSIS — J189 Pneumonia, unspecified organism: Secondary | ICD-10-CM | POA: Diagnosis not present

## 2020-09-17 DIAGNOSIS — Z96642 Presence of left artificial hip joint: Secondary | ICD-10-CM

## 2020-09-17 DIAGNOSIS — J9601 Acute respiratory failure with hypoxia: Secondary | ICD-10-CM | POA: Diagnosis present

## 2020-09-17 DIAGNOSIS — Z96643 Presence of artificial hip joint, bilateral: Secondary | ICD-10-CM | POA: Diagnosis present

## 2020-09-17 DIAGNOSIS — R600 Localized edema: Secondary | ICD-10-CM | POA: Diagnosis not present

## 2020-09-17 DIAGNOSIS — M160 Bilateral primary osteoarthritis of hip: Secondary | ICD-10-CM | POA: Diagnosis not present

## 2020-09-17 DIAGNOSIS — K648 Other hemorrhoids: Secondary | ICD-10-CM | POA: Diagnosis present

## 2020-09-17 DIAGNOSIS — K219 Gastro-esophageal reflux disease without esophagitis: Secondary | ICD-10-CM | POA: Diagnosis present

## 2020-09-17 DIAGNOSIS — Z79899 Other long term (current) drug therapy: Secondary | ICD-10-CM

## 2020-09-17 DIAGNOSIS — R7989 Other specified abnormal findings of blood chemistry: Secondary | ICD-10-CM | POA: Diagnosis not present

## 2020-09-17 DIAGNOSIS — D539 Nutritional anemia, unspecified: Secondary | ICD-10-CM | POA: Diagnosis present

## 2020-09-17 DIAGNOSIS — I5021 Acute systolic (congestive) heart failure: Secondary | ICD-10-CM | POA: Diagnosis not present

## 2020-09-17 DIAGNOSIS — Z888 Allergy status to other drugs, medicaments and biological substances status: Secondary | ICD-10-CM

## 2020-09-17 DIAGNOSIS — M5136 Other intervertebral disc degeneration, lumbar region: Secondary | ICD-10-CM | POA: Diagnosis present

## 2020-09-17 DIAGNOSIS — I10 Essential (primary) hypertension: Secondary | ICD-10-CM | POA: Diagnosis present

## 2020-09-17 DIAGNOSIS — Z825 Family history of asthma and other chronic lower respiratory diseases: Secondary | ICD-10-CM

## 2020-09-17 DIAGNOSIS — Z833 Family history of diabetes mellitus: Secondary | ICD-10-CM

## 2020-09-17 DIAGNOSIS — E119 Type 2 diabetes mellitus without complications: Secondary | ICD-10-CM | POA: Diagnosis present

## 2020-09-17 DIAGNOSIS — J90 Pleural effusion, not elsewhere classified: Secondary | ICD-10-CM | POA: Diagnosis not present

## 2020-09-17 DIAGNOSIS — M7989 Other specified soft tissue disorders: Secondary | ICD-10-CM | POA: Diagnosis not present

## 2020-09-17 DIAGNOSIS — J811 Chronic pulmonary edema: Secondary | ICD-10-CM | POA: Diagnosis present

## 2020-09-17 DIAGNOSIS — R102 Pelvic and perineal pain: Secondary | ICD-10-CM | POA: Diagnosis present

## 2020-09-17 DIAGNOSIS — Z801 Family history of malignant neoplasm of trachea, bronchus and lung: Secondary | ICD-10-CM

## 2020-09-17 DIAGNOSIS — A419 Sepsis, unspecified organism: Secondary | ICD-10-CM | POA: Diagnosis not present

## 2020-09-17 LAB — CBC WITH DIFFERENTIAL/PLATELET
Abs Immature Granulocytes: 0.09 10*3/uL — ABNORMAL HIGH (ref 0.00–0.07)
Basophils Absolute: 0 10*3/uL (ref 0.0–0.1)
Basophils Relative: 0 %
Eosinophils Absolute: 0.5 10*3/uL (ref 0.0–0.5)
Eosinophils Relative: 3 %
HCT: 22.1 % — ABNORMAL LOW (ref 36.0–46.0)
Hemoglobin: 7 g/dL — ABNORMAL LOW (ref 12.0–15.0)
Immature Granulocytes: 1 %
Lymphocytes Relative: 16 %
Lymphs Abs: 2.4 10*3/uL (ref 0.7–4.0)
MCH: 34.5 pg — ABNORMAL HIGH (ref 26.0–34.0)
MCHC: 31.7 g/dL (ref 30.0–36.0)
MCV: 108.9 fL — ABNORMAL HIGH (ref 80.0–100.0)
Monocytes Absolute: 1.3 10*3/uL — ABNORMAL HIGH (ref 0.1–1.0)
Monocytes Relative: 8 %
Neutro Abs: 10.8 10*3/uL — ABNORMAL HIGH (ref 1.7–7.7)
Neutrophils Relative %: 72 %
Platelets: 261 10*3/uL (ref 150–400)
RBC: 2.03 MIL/uL — ABNORMAL LOW (ref 3.87–5.11)
RDW: 15.9 % — ABNORMAL HIGH (ref 11.5–15.5)
WBC: 15.1 10*3/uL — ABNORMAL HIGH (ref 4.0–10.5)
nRBC: 0.1 % (ref 0.0–0.2)

## 2020-09-17 LAB — VITAMIN B12: Vitamin B-12: 859 pg/mL (ref 180–914)

## 2020-09-17 LAB — URINALYSIS, COMPLETE (UACMP) WITH MICROSCOPIC
Bacteria, UA: NONE SEEN
Bilirubin Urine: NEGATIVE
Glucose, UA: NEGATIVE mg/dL
Hgb urine dipstick: NEGATIVE
Ketones, ur: NEGATIVE mg/dL
Leukocytes,Ua: NEGATIVE
Nitrite: POSITIVE — AB
Protein, ur: NEGATIVE mg/dL
Specific Gravity, Urine: 1.01 (ref 1.005–1.030)
pH: 5 (ref 5.0–8.0)

## 2020-09-17 LAB — APTT: aPTT: 37 seconds — ABNORMAL HIGH (ref 24–36)

## 2020-09-17 LAB — LACTIC ACID, PLASMA
Lactic Acid, Venous: 1.5 mmol/L (ref 0.5–1.9)
Lactic Acid, Venous: 1.6 mmol/L (ref 0.5–1.9)

## 2020-09-17 LAB — COMPREHENSIVE METABOLIC PANEL
ALT: 17 U/L (ref 0–44)
AST: 26 U/L (ref 15–41)
Albumin: 3.7 g/dL (ref 3.5–5.0)
Alkaline Phosphatase: 64 U/L (ref 38–126)
Anion gap: 6 (ref 5–15)
BUN: 15 mg/dL (ref 8–23)
CO2: 25 mmol/L (ref 22–32)
Calcium: 8.2 mg/dL — ABNORMAL LOW (ref 8.9–10.3)
Chloride: 107 mmol/L (ref 98–111)
Creatinine, Ser: 0.81 mg/dL (ref 0.44–1.00)
GFR, Estimated: >60 mL/min (ref >60)
Glucose, Bld: 110 mg/dL — ABNORMAL HIGH (ref 70–99)
Potassium: 3.6 mmol/L (ref 3.5–5.1)
Sodium: 138 mmol/L (ref 135–145)
Total Bilirubin: 1.1 mg/dL (ref 0.3–1.2)
Total Protein: 6 g/dL — ABNORMAL LOW (ref 6.5–8.1)

## 2020-09-17 LAB — PROTIME-INR
INR: 1.1 (ref 0.8–1.2)
Prothrombin Time: 13.9 seconds (ref 11.4–15.2)

## 2020-09-17 LAB — RESP PANEL BY RT-PCR (FLU A&B, COVID) ARPGX2
Influenza A by PCR: NEGATIVE
Influenza B by PCR: NEGATIVE
SARS Coronavirus 2 by RT PCR: NEGATIVE

## 2020-09-17 LAB — PROCALCITONIN: Procalcitonin: 0.17 ng/mL

## 2020-09-17 LAB — HIV ANTIBODY (ROUTINE TESTING W REFLEX): HIV Screen 4th Generation wRfx: NONREACTIVE

## 2020-09-17 LAB — TSH: TSH: 0.315 u[IU]/mL — ABNORMAL LOW (ref 0.350–4.500)

## 2020-09-17 LAB — BRAIN NATRIURETIC PEPTIDE: B Natriuretic Peptide: 37.6 pg/mL (ref 0.0–100.0)

## 2020-09-17 LAB — D-DIMER, QUANTITATIVE: D-Dimer, Quant: 0.59 ug/mL-FEU — ABNORMAL HIGH (ref 0.00–0.50)

## 2020-09-17 MED ORDER — DULOXETINE HCL 30 MG PO CPEP
60.0000 mg | ORAL_CAPSULE | Freq: Two times a day (BID) | ORAL | Status: DC
  Administered 2020-09-17 – 2020-09-21 (×8): 60 mg via ORAL
  Filled 2020-09-17 (×8): qty 2

## 2020-09-17 MED ORDER — ACETAMINOPHEN 325 MG PO TABS: 650.0000 mg | ORAL_TABLET | Freq: Four times a day (QID) | ORAL | Status: AC | PRN

## 2020-09-17 MED ORDER — LORATADINE 10 MG PO TABS
10.0000 mg | ORAL_TABLET | Freq: Every day | ORAL | Status: DC
  Administered 2020-09-17 – 2020-09-21 (×5): 10 mg via ORAL
  Filled 2020-09-17 (×5): qty 1

## 2020-09-17 MED ORDER — ALBUTEROL SULFATE (2.5 MG/3ML) 0.083% IN NEBU
3.0000 mL | INHALATION_SOLUTION | Freq: Four times a day (QID) | RESPIRATORY_TRACT | Status: DC | PRN
  Administered 2020-09-18: 13:00:00 3 mL via RESPIRATORY_TRACT
  Filled 2020-09-17: qty 3

## 2020-09-17 MED ORDER — PANTOPRAZOLE SODIUM 40 MG PO TBEC
80.0000 mg | DELAYED_RELEASE_TABLET | Freq: Every day | ORAL | Status: DC
  Administered 2020-09-18 – 2020-09-21 (×4): 80 mg via ORAL
  Filled 2020-09-17 (×4): qty 2

## 2020-09-17 MED ORDER — LACTATED RINGERS IV SOLN: INTRAVENOUS | Status: DC

## 2020-09-17 MED ORDER — HYDROCODONE-ACETAMINOPHEN 5-325 MG PO TABS
2.0000 | ORAL_TABLET | Freq: Four times a day (QID) | ORAL | Status: DC | PRN
  Administered 2020-09-17 – 2020-09-21 (×12): 2 via ORAL
  Filled 2020-09-17 (×12): qty 2

## 2020-09-17 MED ORDER — IPRATROPIUM-ALBUTEROL 0.5-2.5 (3) MG/3ML IN SOLN
3.0000 mL | Freq: Once | RESPIRATORY_TRACT | Status: AC
  Administered 2020-09-17: 3 mL via RESPIRATORY_TRACT
  Filled 2020-09-17: qty 3

## 2020-09-17 MED ORDER — LEVOCETIRIZINE DIHYDROCHLORIDE 5 MG PO TABS: 5.0000 mg | ORAL_TABLET | Freq: Every evening | ORAL | Status: DC

## 2020-09-17 MED ORDER — ACETAMINOPHEN 650 MG RE SUPP: 650.0000 mg | Freq: Four times a day (QID) | RECTAL | Status: AC | PRN

## 2020-09-17 MED ORDER — LEVOFLOXACIN IN D5W 750 MG/150ML IV SOLN
750.0000 mg | Freq: Once | INTRAVENOUS | Status: AC
  Administered 2020-09-17: 750 mg via INTRAVENOUS
  Filled 2020-09-17: qty 150

## 2020-09-17 MED ORDER — ONDANSETRON HCL 4 MG/2ML IJ SOLN
4.0000 mg | Freq: Four times a day (QID) | INTRAMUSCULAR | Status: DC | PRN
Start: 2020-09-17 — End: 2020-09-21

## 2020-09-17 MED ORDER — METHOCARBAMOL 750 MG PO TABS
750.0000 mg | ORAL_TABLET | Freq: Three times a day (TID) | ORAL | Status: DC | PRN
  Filled 2020-09-17: qty 1

## 2020-09-17 MED ORDER — SODIUM CHLORIDE 0.9 % IV SOLN: 1.0000 g | INTRAVENOUS | Status: DC

## 2020-09-17 MED ORDER — FAMOTIDINE 20 MG PO TABS
40.0000 mg | ORAL_TABLET | Freq: Every day | ORAL | Status: DC
  Administered 2020-09-17 – 2020-09-20 (×4): 40 mg via ORAL
  Filled 2020-09-17 (×4): qty 2

## 2020-09-17 MED ORDER — GABAPENTIN 300 MG PO CAPS
300.0000 mg | ORAL_CAPSULE | Freq: Two times a day (BID) | ORAL | Status: DC
  Administered 2020-09-17 – 2020-09-21 (×8): 300 mg via ORAL
  Filled 2020-09-17 (×8): qty 1

## 2020-09-17 MED ORDER — PHENAZOPYRIDINE HCL 200 MG PO TABS: 200.0000 mg | ORAL_TABLET | Freq: Three times a day (TID) | ORAL | Status: DC | PRN

## 2020-09-17 MED ORDER — ONDANSETRON HCL 4 MG PO TABS
4.0000 mg | ORAL_TABLET | Freq: Four times a day (QID) | ORAL | Status: DC | PRN
Start: 2020-09-17 — End: 2020-09-21

## 2020-09-17 MED ORDER — MONTELUKAST SODIUM 10 MG PO TABS
10.0000 mg | ORAL_TABLET | Freq: Every day | ORAL | Status: DC
  Administered 2020-09-17 – 2020-09-20 (×4): 10 mg via ORAL
  Filled 2020-09-17 (×4): qty 1

## 2020-09-17 MED ORDER — LACTATED RINGERS IV BOLUS (SEPSIS)
1000.0000 mL | Freq: Once | INTRAVENOUS | Status: AC
  Administered 2020-09-17: 1000 mL via INTRAVENOUS

## 2020-09-17 MED ORDER — MOMETASONE FURO-FORMOTEROL FUM 200-5 MCG/ACT IN AERO
2.0000 | INHALATION_SPRAY | Freq: Two times a day (BID) | RESPIRATORY_TRACT | Status: DC
  Administered 2020-09-17 – 2020-09-21 (×8): 2 via RESPIRATORY_TRACT
  Filled 2020-09-17: qty 8.8

## 2020-09-17 MED ORDER — HYDROCODONE-ACETAMINOPHEN 10-325 MG PO TABS: 1.0000 | ORAL_TABLET | Freq: Four times a day (QID) | ORAL | Status: DC | PRN

## 2020-09-17 MED ORDER — SODIUM CHLORIDE 0.9 % IV SOLN
500.0000 mg | INTRAVENOUS | Status: AC
  Administered 2020-09-18 – 2020-09-20 (×3): 500 mg via INTRAVENOUS
  Filled 2020-09-17 (×3): qty 500

## 2020-09-17 MED ORDER — BUSPIRONE HCL 10 MG PO TABS
10.0000 mg | ORAL_TABLET | Freq: Three times a day (TID) | ORAL | Status: DC
  Administered 2020-09-17 – 2020-09-21 (×12): 10 mg via ORAL
  Filled 2020-09-17 (×14): qty 1

## 2020-09-17 MED ORDER — PHENAZOPYRIDINE HCL 200 MG PO TABS
200.0000 mg | ORAL_TABLET | Freq: Three times a day (TID) | ORAL | Status: AC | PRN
  Filled 2020-09-17: qty 1

## 2020-09-17 NOTE — ED Notes (Signed)
[facility], MD at bedside. 

## 2020-09-17 NOTE — Sepsis Progress Note (Signed)
Elink monitoring code sepsis 

## 2020-09-17 NOTE — ED Triage Notes (Signed)
Patient to ED via [facility] from [place] [facility] for SOB that started yesterday. Patient was initially 86% on RA and came up to 91% with albuterol treatment. Patient alert and oriented.

## 2020-09-17 NOTE — Consult Note (Signed)
CODE SEPSIS - PHARMACY COMMUNICATION  **Broad Spectrum Antibiotics should be administered within 1 hour of Sepsis diagnosis**  Time Code Sepsis Called/Page Received: 1352  Antibiotics Ordered: 1351  Time of 1st antibiotic administration: [id number]  Additional action taken by pharmacy: N/A    Berta Minor ,PharmD Clinical Pharmacist  09/17/2020  2:15 PM

## 2020-09-17 NOTE — Consult Note (Signed)
PHARMACY -  BRIEF ANTIBIOTIC NOTE   Pharmacy has received consult(s) for Levaquin dosing from an ED provider.  The patient's profile has been reviewed for ht/wt/allergies/indication/available labs.    One time order(s) placed for Levaquin '750mg'$  IV.  Further antibiotics/pharmacy consults should be ordered by admitting physician if indicated.                       Thank you, Berta Minor 09/17/2020  2:24 PM

## 2020-09-17 NOTE — ED Notes (Signed)
Lindsay RN aware of assigned bed 

## 2020-09-17 NOTE — H&P (Signed)
History and Physical   Bethany Scott ERX:[id number] DOB: 1955/07/09 DOA: 09/17/2020  PCP: Bethany Honour, MD  Outpatient Specialists:  patient coming from: [facility]   I have personally briefly reviewed patient's old medical records in [place].  Chief Concern: shortness of breath   HPI: Bethany Scott is a 65 y.o. female with medical history significant for non-insulin-dependent diabetes mellitus, history of hypertension, history of hyperlipidemia, depression, anxiety, GERD,chronic anemia, frequent cystitis, presents the emergency department for chief concerns of shortness of breath, productive cough.  She endorsed T max of 100.7 at home. She endorsed new back pain that started about 2 weeks ago.  The pain is at the low follow-up L2-L3 bilaterally.  She does not know when it comes on or what makes it go away.  She reports that when she does, on she could be laying down, sitting down or doing something.  The pain is described as sharp, 10 out of 10, lasting seconds to minutes, and she denies incontinence or bilateral shooting pain down her legs.    She endorses new dysuria, that has been been continuous for two weeks. She was prescribed macrobid for 7-8 days, completed treatment about 4 days ago.   She endorsed new cough, Sypher pleghm that started yesterday  She denies double vision, loss of consciousness, syncope, chest pain, abdominal pain, diarrhea. She reports her last bowel movememt was 09/17/20 and it was normal, [name], and well formed.   She took ivermectin about one year ago for covid 19 infection.   Social history: She lives with her husband, Bethany Scott and a 73 year old son. She denies current tobacco use. She formerly smoked and quit 36 years ago. She infrequently drinks etoh, last drink was two weeks ago. She reports likely a total of 5 drinks per year. She denies recreational drug use. She currentl works at [employer], [employer] center, check in.   Vaccination  history: She is vaccinated for covid 29, two [place].   ROS: Constitutional: no weight change, no fever ENT/Mouth: no sore throat, no rhinorrhea Eyes: no eye pain, no vision changes Cardiovascular: no chest pain, no dyspnea,  no edema, no palpitations Respiratory: no cough, no sputum, no wheezing Gastrointestinal: no nausea, no vomiting, no diarrhea, no constipation Genitourinary: no urinary incontinence, no dysuria, no hematuria Musculoskeletal: no arthralgias, no myalgias Skin: no skin lesions, no pruritus, Neuro: + weakness, no loss of consciousness, no syncope Psych: no anxiety, no depression, + decrease appetite Heme/Lymph: no bruising, no bleeding  ED Course: Discussed with emergency medicine provider, patient requiring hospitalization for meeting sepsis criteria.  Vitals in the emergency department was remarkable for temperature 99.9, respiration rate of 20, heart rate 111, blood pressure 166/95 and improved to 118/67, patient on 92% on 4 L nasal cannula.  Labs in the emergency department was remarkable for WBC 15.1, hemoglobin 7, platelets 261, serum sodium 138, potassium 3.6, chloride 107, bicarb 25, BUN 15, serum creatinine of 0.81, nonfasting blood glucose 110, EGFR 60.  Assessment/Plan  Principal Problem:   Sepsis ([place]) Active Problems:   Depression with anxiety   Essential hypertension, benign   Pure hypercholesterolemia   DDD (degenerative disc disease), lumbar   Degenerative joint disease (DJD) of hip   S/P laparoscopic sleeve gastrectomy   Status post total hip replacement, right   Status post total hip replacement, left   Pelvic pain in female   # Meet sepsis criteria - Elevated heart rate, leukocytosis, possible source of pneumonia versus  urine - UA, urine culture, procalcitonin - Blood cultures x2 - Continue LR at 150 mL/h, for 20 hours - Levofloxacin IV once per EDP  - Azithromycin IV, 3 days ordered - Strict I's and O's - Admit to MedSurg, observation,  telemetry for 24 hours  # Acute on chronic anemia - Macrocytic anemia, new - Patient reports infrequent EtOH use, approximately 5 drinks total per year - B12, folate, TSH, B1 - CBC in the a.m. - If the work-up is negative would recommend PCP to refer patient for outpatient hematology work-up - Discussed extensively with patient regarding low hemoglobin  # Depression/anxiety-resumed home buspirone 10 mg p.o. 3 times daily, duloxetine 60 mg p.o. twice daily  # Bilateral lumbar level pain at the level of L2-L3 - I suspect this is parasympathetic pain in setting of UTI/chronic cystitis - UA in process - I discussed with patient extensively that if UTI is completely treated and resolved with antibiotic treatment, and patient continues to have intermittent pain, I recommend patient to discuss low back pain with PCP versus neurologist for possible MR imaging  # GERD-PPI, Pepcid  # History of closed nondisplaced fracture of the fifth metatarsal bone of the left foot  # History of closed nondisplaced fracture of the lateral malleolus of the left fibula  # Neuropathy-gabapentin 300 mg, 3 times daily  # Chronic pain in setting of degenerative disc disease-resumed Robaxin 750, 3 times daily as needed for muscle spasms, Norco 10 every 6 hours as needed for moderate pain  # Bilateral lower extremity swelling-etiology is multifactorial - Started about 2 days ago per patient and she denies weight gain - BNP is negative at 37.6 on admission - DVT ultrasound was negative - Would recommend low-sodium diet and outpatient follow-up with PCP - If this persists would recommend PCP to order echo - Strict I's and O's  # DVT prophylaxis-I have not initiated pharmacologic DVT prophylaxis due to acute anemia with hemoglobin of 7 - TED hose ordered - A.m. team to initiate DVT pharmacologic prophylaxis when hemoglobin improves  Chart reviewed.   DVT prophylaxis: TED hose Code Status: full code  Diet:  Heart healthy Family Communication: Updated spouse, Bethany Scott at bedside Disposition Plan: Pending clinical course Consults called: None at this time Admission status: MedSurg, observation, telemetry for 24 hours  Past Medical History:  Diagnosis Date   Allergy    Anemia    Anxiety    Arthritis    OA Knee; non-specific autoimmune process followed by [facility]/Rheumatology.   Asthma    Blood transfusion without reported diagnosis YRS AGO   COVID-19 03/2018   Depression    Diabetes mellitus without complication (HCC)    Gastric ulcer, unspecified as acute or chronic, without mention of hemorrhage, perforation, or obstruction YRS AGO   GERD (gastroesophageal reflux disease)    Hyperlipidemia    Hypertension    Migraine    hx of migraines   Obesity, unspecified    Other chronic cystitis    Personal history of colonic polyps    PONV (postoperative nausea and vomiting)    NAUSEA, SCOPOLAMINE PATCH HELPED WITH LAST SURGERIES   Pre-diabetes    Past Surgical History:  Procedure Laterality Date   ABDOMINAL HYSTERECTOMY  01/25/1994   uterine fibroids; DUB; cervical dysplasia; ovaries resected two years later.     APPENDECTOMY     BILATERAL OOPHORECTOMY  2001   CESAREAN SECTION     x 2   COLON SURGERY  YRS AGO  SIGMOIDECTOMY   Colonoscopy  09/08/2012   diverticulosis, hemorrhoids.  [name].  Symptoms:  anemia, hemoccult +.   ESOPHAGOGASTRODUODENOSCOPY  09/08/2012   +HH.  [name].  Symptoms: anemia, hemoccult +.   HIP ARTHROSCOPY Right    LAPAROSCOPIC GASTRIC SLEEVE RESECTION N/A 03/01/2016   Procedure: LAPAROSCOPIC GASTRIC SLEEVE RESECTION, UPPER ENDO;  Surgeon: [name], MD;  Location: [facility];  Service: General;  Laterality: N/A;   LAPAROSCOPIC LYSIS OF ADHESIONS N/A 02/10/2016   Procedure: LAPAROSCOPIC LYSIS OF ADHESIONS;  Surgeon: [name], MD;  Location: [facility];  Service: General;  Laterality: N/A;   LAPAROSCOPY N/A 02/10/2016   Procedure: LAPAROSCOPY DIAGNOSTIC;  Surgeon:  [name], MD;  Location: [facility];  Service: General;  Laterality: N/A;   NASAL SEPTUM SURGERY     NECK SURGERY     Rheumatology Consult  11/26/2011   multiple arthralgias. Autoimmune labs negative.  Rx for Plaquenil for non-specific autoimmune process.  [name].   SIGMOID RESECTION / RECTOPEXY     SPINE SURGERY     TONSILLECTOMY AND ADENOIDECTOMY     TOTAL HIP ARTHROPLASTY Right 09/27/2017   Procedure: TOTAL HIP ARTHROPLASTY ANTERIOR APPROACH;  Surgeon: [name], MD;  Location: [facility];  Service: Orthopedics;  Laterality: Right;   TOTAL HIP ARTHROPLASTY Left 12/27/2017   Procedure: TOTAL HIP ARTHROPLASTY ANTERIOR APPROACH;  Surgeon: [name], MD;  Location: [facility];  Service: Orthopedics;  Laterality: Left;   Social History:  reports that she has quit smoking. She has a 5.00 pack-year smoking history. She has never used smokeless tobacco. She reports current alcohol use. She reports that she does not use drugs.  Allergies  Allergen Reactions   Cephalexin Anaphylaxis   Cephalosporins Anaphylaxis   Penicillins Anaphylaxis and Other (See Comments)    Has patient had a PCN reaction causing immediate rash, facial/tongue/throat swelling, SOB or lightheadedness with hypotension: Yes Has patient had a PCN reaction causing severe rash involving mucus membranes or skin necrosis: No Has patient had a PCN reaction that required hospitalization Yes Has patient had a PCN reaction occurring within the last 10 years: No If all of the above answers are "NO", then may proceed with Cephalosporin use.    Family History  Problem Relation Age of Onset   Bipolar disorder Father    Transient ischemic attack Father    Dementia Father        secondary to head trauma   Hypertension Father    Diabetes Father    CAD Father    Cancer Mother 21       lung   Migraines Mother    Hypothyroidism Mother    Hypertension Sister    Atrial fibrillation Sister    Hypothyroidism Sister    Alcohol  abuse Brother    Hypertension Brother    Alcohol abuse Brother    Atrial fibrillation Maternal Grandmother    COPD Maternal Grandmother    Diabetes Maternal Grandmother    Hyperlipidemia Maternal Grandmother    Diabetes Maternal Grandfather    Diabetes Paternal Grandfather    Heart disease Paternal Grandfather    Breast cancer Sister 81   Family history: Family history reviewed and not pertinent  Prior to Admission medications   Medication Sig Start Date End Date Taking? Authorizing Provider  ACZONE 7.5 % GEL APPLY TOPICALLY TO THE AFFECTED AREA ON THE SKIN EVERY DAY 03/16/18   [provider]  albuterol (VENTOLIN HFA) 108 (90 Base) MCG/ACT inhaler Inhale 1-2 puffs into the lungs every  6 (six) hours as needed for wheezing or shortness of breath. 01/30/20   Loura Halt A, NP  Azelastine-Fluticasone 137-50 MCG/ACT SUSP Place 2 sprays into the nose 2 (two) times a day. 08/10/18   Darlin Priestly, PA-C  azithromycin (ZITHROMAX Z-PAK) 250 MG tablet Take as directed Patient not taking: Reported on 09/17/2020 06/19/20   Ralene Bathe, MD  beclomethasone (QVAR) 80 MCG/ACT inhaler Inhale 2 puffs into the lungs daily.     [provider]  benzonatate (TESSALON) 100 MG capsule Take 2 capsules (200 mg total) by mouth every 8 (eight) hours. Patient not taking: Reported on 09/17/2020 03/04/20   Loura Halt A, NP  Betamethasone Valerate 0.12 % foam  08/29/17   [provider]  budesonide-formoterol (SYMBICORT) 160-4.5 MCG/ACT inhaler INHALE 2 PUFFS BY MOUTH 2 TIMES DAILY 03/28/20 03/28/21  Tiajuana Amass, MD  busPIRone (BUSPAR) 10 MG tablet Take 1 tablet (10 mg total) by mouth 3 (three) times daily 06/02/20     cetirizine (ZYRTEC) 10 MG tablet Take 10 mg by mouth 2 (two) times daily.     [provider]  clindamycin (CLEOCIN T) 1 % lotion  11/10/17   [provider]  COVID-19 At Home Antigen Test [facility] COVID-19 HOME TEST) KIT Use as directed on package 05/20/20    Edmon Crape, [facility]  dexlansoprazole (DEXILANT) 60 MG capsule TAKE 1 CAPSULE (60 MG TOTAL) BY MOUTH ONCE DAILY 12/14/19 12/13/20  Ok Edwards, NP  dexlansoprazole (DEXILANT) 60 MG capsule Take 1 capsule (60 mg total) by mouth once daily 06/13/20     diclofenac sodium (VOLTAREN) 1 % GEL Apply 1 application topically daily as needed for pain. 07/05/17   [provider]  doxycycline (MONODOX) 100 MG capsule TAKE 1 CAPSULE (100 MG TOTAL) BY MOUTH 2 (TWO) TIMES DAILY FOR 7 DAYS. WITH FOOD 06/19/20   Moye, [place], MD  DULoxetine (CYMBALTA) 60 MG capsule Take 1 capsule (60 mg total) by mouth 2 (two) times daily. 04/30/17   Bethany Honour, MD  DULoxetine (CYMBALTA) 60 MG capsule TAKE 1 CAPSULE BY MOUTH 2 TIMES DAILY 12/06/19 12/14/20  Bethany Honour, MD  DULoxetine (CYMBALTA) 60 MG capsule TAKE 1 CAPSULE BY MOUTH TWICE DAILY 09/09/19 09/08/20  Bethany Honour, MD  famotidine (PEPCID) 40 MG tablet TAKE 1 TABLET (40 MG TOTAL) BY MOUTH NIGHTLY 12/14/19 12/13/20  Ok Edwards, NP  famotidine (PEPCID) 40 MG tablet Take 1 tablet (40 mg total) by mouth nightly 06/13/20     fluconazole (DIFLUCAN) 150 MG tablet Take one tablet as needed for yeast infection due to antibiotic use. Repeat in one week if needed. 11/23/19   Moye, [place], MD  Fluticasone Propionate, Inhal, 100 MCG/BLIST AEPB INHALE 1 PUFF INTO LUNGS TWICE DAILY. 12/06/19 12/05/20  Bethany Honour, MD  gabapentin (NEURONTIN) 300 MG capsule TAKE 1 TO 3 CAPSULES BY MOUTH THREE TIMES DAILY 10/19/19 11/27/20  Bethany Honour, MD  HYDROcodone-acetaminophen [facility]) 10-325 MG tablet Take 1 tablet by mouth every 6 (six) hours as needed.    [provider]  hydrocortisone 2.5 % cream TWICE DAILY TO AFFECTED AREA FOR UP TO 2 WEEKS AS NEEDED FOR RASH 06/19/20   Moye, [place], MD  imiquimod (ALDARA) 5 % cream AT BEDTIME FOR 2 WEEKS AS TOLERATED, 2 WEEKS OFF AND REPEAT 2 WEEKS AT BEDTIME. 06/19/20   Brendolyn Patty, MD   ivermectin (STROMECTOL) 3 MG TABS tablet Take 6 tablets by mouth with food for three days 10/02/19  Brendolyn Patty, MD  ivermectin (STROMECTOL) 3 MG TABS tablet Take 6 tablets by mouth daily with food for three days 10/02/19   Brendolyn Patty, MD  ketoconazole (NIZORAL) 2 % shampoo  12/02/17   [provider]  ketoconazole (NIZORAL) 2 % shampoo APPLY 1 APPLICATION TOPICALLY EVERY OTHER DAY. 07/30/19   Ralene Bathe, MD  levocetirizine (XYZAL) 5 MG tablet TAKE 1 TABLET BY MOUTH EVERY EVENING 09/09/19 09/22/20  Bethany Honour, MD  levofloxacin (LEVAQUIN) 500 MG tablet Take 1 tablet (500 mg total) by mouth daily. 03/04/20   Loura Halt A, NP  methocarbamol (ROBAXIN) 750 MG tablet  02/22/18   [provider]  montelukast (SINGULAIR) 10 MG tablet Take 1 tablet (10 mg total) by mouth daily. 04/30/17   Bethany Honour, MD  montelukast (SINGULAIR) 10 MG tablet TAKE 1 TABLET (10 MG TOTAL) BY MOUTH NIGHTLY 12/06/19 12/14/20  Bethany Honour, MD  montelukast (SINGULAIR) 10 MG tablet TAKE 1 TABLET BY MOUTH ONCE DAILY AT NIGHT 09/09/19 09/08/20  Bethany Honour, MD  mupirocin ointment (BACTROBAN) 2 % APPLY TO AFFECTED AREA TWICE A DAY 06/19/20   Moye, [place], MD  nitrofurantoin, macrocrystal-monohydrate, (MACROBID) 100 MG capsule TAKE 1 CAPSULE BY MOUTH ONCE DAILY AS NEEDED 12/06/19 12/05/20  Bethany Honour, MD  nitrofurantoin, macrocrystal-monohydrate, (MACROBID) 100 MG capsule Take 1 capsule (100 mg total) by mouth once daily as needed 08/12/20     nystatin cream (MYCOSTATIN) Apply two to three times per day as needed 06/18/20   [employer], [place], MD  Olopatadine HCl 0.2 % SOLN Apply 1 drop to eye daily. Patient not taking: Reported on 08/10/2018 06/14/17   Bethany Honour, MD  pantoprazole (PROTONIX) 40 MG tablet TAKE 1 TABLET BY MOUTH TWO TIMES DAILY 08/23/17   Bethany Honour, MD  phenazopyridine (PYRIDIUM) 200 MG tablet TAKE 1 TABLET BY MOUTH 3 TIMES DAILY WITH MEALS AS NEEDED; DO NOT USE MORE THAN 3 DAYS  AT A TIME 12/06/19 12/05/20  Bethany Honour, MD  PREVIDENT 5000 DRY MOUTH 1.1 % GEL dental gel Place 1 application onto teeth 2 (two) times daily. 08/03/17   [provider]  promethazine (PHENERGAN) 25 MG tablet TAKE 1 TABLET BY MOUTH EVERY 8 HOURS AS NEEDED FOR NAUSEA OR VOMITING 02/15/20 02/14/21  Bethany Honour, MD  promethazine-dextromethorphan (PROMETHAZINE-DM) 6.25-15 MG/5ML syrup Take 5 mLs by mouth 4 (four) times daily as needed. 02/08/20   Kennyth Arnold, FNP  triamcinolone (KENALOG) 0.1 % paste Dry ulcer with tissue and then apply paste to affected area twice a day until no longer tender. 02/21/20   Alfonso Patten, MD   Physical Exam: Vitals:   09/17/20 1533 09/17/20 1600 09/17/20 1741 09/17/20 1937  BP: 127/61 94/81 117/62 (!) 114/50  Pulse: (!) 104 98 97 (!) 101  Resp: _0 Temp:   99.9 F (37.7 C) 99.2 F (37.3 C)  TempSrc:   Oral Oral  SpO2: 94% 93% 94% 93%  Weight:      Height:       Constitutional: appears age-appropriate, NAD, calm, comfortable Eyes: PERRL, lids and conjunctivae normal ENMT: Mucous membranes are moist. Posterior pharynx clear of any exudate or lesions. Age-appropriate dentition. Hearing appropriate Neck: normal, supple, no masses, no thyromegaly Respiratory: clear to auscultation bilaterally, no wheezing, no crackles. Normal respiratory effort. No accessory muscle use.  Cardiovascular: Regular rate and rhythm, no murmurs / rubs / gallops.  Bilateral trace lower extremity edema. 2+ pedal pulses.  No carotid bruits.  Abdomen: Obese abdomen, no tenderness, no masses palpated, no hepatosplenomegaly. Bowel sounds positive.  Musculoskeletal: no clubbing / cyanosis. No joint deformity upper and lower extremities. Good ROM, no contractures, no atrophy. Normal muscle tone.  Skin: no rashes, lesions, ulcers. No induration Neurologic: Sensation intact. Strength 5/5 in all 4.  Psychiatric: Normal judgment and insight. Alert and oriented x 3. Normal  mood.   EKG: independently reviewed, showing sinus tachycardia with rate of 106, QTc 471  Chest x-ray on Admission: I personally reviewed and I agree with radiologist reading as below.  US Venous Img Lower Bilateral  Result Date: 09/17/2020 CLINICAL DATA:  Leg swelling x1 week. EXAM: BILATERAL LOWER EXTREMITY VENOUS DOPPLER ULTRASOUND TECHNIQUE: Gray-scale sonography with compression, as well as color and duplex ultrasound, were performed to evaluate the deep venous system(s) from the level of the common femoral vein through the popliteal and proximal calf veins. COMPARISON:  None. FINDINGS: VENOUS Normal compressibility of the common femoral, superficial femoral, and popliteal veins, as well as the visualized calf veins. Visualized portions of profunda femoral vein and great saphenous vein unremarkable. No filling defects to suggest DVT on grayscale or color Doppler imaging. Doppler waveforms show normal direction of venous flow, normal respiratory phasicity and response to augmentation. OTHER Bilateral subcutaneous calf edema. Limitations: none IMPRESSION: No femoropopliteal DVT nor evidence of DVT within the visualized calf veins. If clinical symptoms are inconsistent or if there are persistent or worsening symptoms, further imaging (possibly involving the iliac veins) may be warranted. Electronically Signed   By: Lucrezia Europe M.D.   On: 09/17/2020 15:33   DG Chest Port 1 View  Result Date: 09/17/2020 CLINICAL DATA:  Sepsis. EXAM: PORTABLE CHEST 1 VIEW COMPARISON:  11/14/2015 FINDINGS: Heart size appears normal. No pleural effusion. Diffuse pulmonary vascular congestion identified. No superimposed airspace consolidation. The visualized osseous structures appear unremarkable. IMPRESSION: Diffuse pulmonary vascular congestion. Electronically Signed   By: Kerby Moors M.D.   On: 09/17/2020 15:08    Labs on Admission: I have personally reviewed following labs  CBC: Recent Labs  Lab 09/17/20 1349   WBC 15.1*  NEUTROABS 10.8*  HGB 7.0*  HCT 22.1*  MCV 108.9*  PLT 631   Basic Metabolic Panel: Recent Labs  Lab 09/17/20 1349  NA 138  K 3.6  CL 107  CO2 25  GLUCOSE 110*  BUN 15  CREATININE 0.81  CALCIUM 8.2*   GFR: Estimated Creatinine Clearance: 90.3 mL/min (by C-G formula based on SCr of 0.81 mg/dL).  Liver Function Tests: Recent Labs  Lab 09/17/20 1349  AST 26  ALT 17  ALKPHOS 64  BILITOT 1.1  PROT 6.0*  ALBUMIN 3.7   Coagulation Profile: Recent Labs  Lab 09/17/20 1349  INR 1.1   Urine analysis:    Component Value Date/Time   COLORURINE AMBER (A) 09/17/2020 1628   APPEARANCEUR CLEAR (A) 09/17/2020 1628   LABSPEC 1.010 09/17/2020 1628   PHURINE 5.0 09/17/2020 1628   GLUCOSEU NEGATIVE 09/17/2020 1628   HGBUR NEGATIVE 09/17/2020 1628   BILIRUBINUR NEGATIVE 09/17/2020 1628   BILIRUBINUR positive 04/05/2018 [place] 09/17/2020 1628   PROTEINUR NEGATIVE 09/17/2020 1628   UROBILINOGEN >=8.0 (A) 04/05/2018 1633   NITRITE POSITIVE (A) 09/17/2020 1628   LEUKOCYTESUR NEGATIVE 09/17/2020 1628   Dr. Tobie Poet [facility]  If 7PM-7AM, please contact overnight-coverage provider If 7AM-7PM, please contact day coverage provider [email or web address]  09/17/2020, 8:01 PM

## 2020-09-17 NOTE — ED Provider Notes (Signed)
[facility] Emergency Department Provider Note   ____________________________________________   Event Date/Time   First MD Initiated Contact with Patient 09/17/20 1349     (approximate)  I have reviewed the triage vital signs and the nursing notes.   HISTORY  Chief Complaint Shortness of Breath    HPI Bethany Scott is a 65 y.o. female patient complains of feeling short of breath and achy and having a cough productive of [place] phlegm starting last night.  This morning she was quite short of breath and went to urgent care was sent here for tachycardia and low O2 sats.  On arrival here her O2 sats were as low as 86% on room air.  They went up quickly on oxygen nasal cannula.  Patient has had COVID in the past.  Patient has diabetes and hypertension.  He is not having any chest pain.        Past Medical History:  Diagnosis Date   Allergy    Anemia    Anxiety    Arthritis    OA Knee; non-specific autoimmune process followed by [facility]/Rheumatology.   Asthma    Blood transfusion without reported diagnosis YRS AGO   COVID-19 03/2018   Depression    Diabetes mellitus without complication ([place])    Gastric ulcer, unspecified as acute or chronic, without mention of hemorrhage, perforation, or obstruction YRS AGO   GERD (gastroesophageal reflux disease)    Hyperlipidemia    Hypertension    Migraine    hx of migraines   Obesity, unspecified    Other chronic cystitis    Personal history of colonic polyps    PONV (postoperative nausea and vomiting)    NAUSEA, SCOPOLAMINE PATCH HELPED WITH LAST SURGERIES   Pre-diabetes     Patient Active Problem List   Diagnosis Date Noted   Status post total hip replacement, 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   Status post total hip replacement, right 09/27/2017   Incomplete emptying of bladder 02/16/2017   Chronic cystitis 02/08/2017   Hematuria, microscopic 02/08/2017   History of nephrolithiasis 02/08/2017   Pelvic pain  in female 02/08/2017   Chronic venous insufficiency 01/06/2017   Varicose veins of leg with pain, right 12/31/2016   GERD (gastroesophageal reflux disease) 03/01/2016   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   Hip arthritis 05/29/2015   Degenerative joint disease (DJD) of hip 07/24/2014   DJD (degenerative joint disease) of knee 06/17/2014   Greater trochanteric bursitis of both hips 06/17/2014   Degenerative joint disease of sacroiliac joint ([place]) 06/17/2014   Facet syndrome, lumbar 06/17/2014   DDD (degenerative disc disease), lumbar 06/17/2014   Low serum vitamin D 06/08/2013   Sinusitis, acute, maxillary 02/08/2012   Migraines 02/08/2012   Depression with anxiety 02/08/2012   Essential hypertension, benign 02/08/2012   Pure hypercholesterolemia 02/08/2012   Diabetes mellitus type 2 in obese ([place]) 02/08/2012    Past Surgical History:  Procedure Laterality Date   ABDOMINAL HYSTERECTOMY  01/25/1994   uterine fibroids; DUB; cervical dysplasia; ovaries resected two years later.     APPENDECTOMY     BILATERAL OOPHORECTOMY  2001   CESAREAN SECTION     x 2   COLON SURGERY  YRS AGO   SIGMOIDECTOMY   Colonoscopy  09/08/2012   diverticulosis, hemorrhoids.  [name].  Symptoms:  anemia, hemoccult +.   ESOPHAGOGASTRODUODENOSCOPY  09/08/2012   +HH.  [name].  Symptoms: anemia, hemoccult +.   HIP ARTHROSCOPY Right    LAPAROSCOPIC GASTRIC SLEEVE RESECTION  N/A 03/01/2016   Procedure: LAPAROSCOPIC GASTRIC SLEEVE RESECTION, UPPER ENDO;  Surgeon: [name], MD;  Location: [facility];  Service: General;  Laterality: N/A;   LAPAROSCOPIC LYSIS OF ADHESIONS N/A 02/10/2016   Procedure: LAPAROSCOPIC LYSIS OF ADHESIONS;  Surgeon: [name], MD;  Location: [facility];  Service: General;  Laterality: N/A;   LAPAROSCOPY N/A 02/10/2016   Procedure: LAPAROSCOPY DIAGNOSTIC;  Surgeon: [name], MD;  Location: [facility];  Service: General;  Laterality: N/A;   NASAL SEPTUM SURGERY     NECK SURGERY      Rheumatology Consult  11/26/2011   multiple arthralgias. Autoimmune labs negative.  Rx for Plaquenil for non-specific autoimmune process.  [name].   SIGMOID RESECTION / RECTOPEXY     SPINE SURGERY     TONSILLECTOMY AND ADENOIDECTOMY     TOTAL HIP ARTHROPLASTY Right 09/27/2017   Procedure: TOTAL HIP ARTHROPLASTY ANTERIOR APPROACH;  Surgeon: [name], MD;  Location: [facility] OR[facility];  Service: Orthopedics;  Laterality: Right;   TOTAL HIP ARTHROPLASTY Left 12/27/2017   Procedure: TOTAL HIP ARTHROPLASTY ANTERIOR APPROACH;  Surgeon: [name], MD;  Location: [facility] ORS;  Service: Orthopedics;  Laterality: Left;    Prior to Admission medications   Medication Sig Start Date End Date Taking? Authorizing Provider  ACZONE 7.5 % GEL APPLY TOPICALLY TO THE AFFECTED AREA ON THE SKIN EVERY DAY 03/16/18   [provider]  albuterol (VENTOLIN HFA) 108 (90 Base) MCG/ACT inhaler Inhale 1-2 puffs into the lungs every 6 (six) hours as needed for wheezing or shortness of breath. 01/30/20   Loura Halt A, NP  Azelastine-Fluticasone 137-50 MCG/ACT SUSP Place 2 sprays into the nose 2 (two) times a day. 08/10/18   Darlin Priestly, PA-C  azithromycin (ZITHROMAX Z-PAK) 250 MG tablet Take as directed 06/19/20   Ralene Bathe, MD  beclomethasone (QVAR) 80 MCG/ACT inhaler Inhale 2 puffs into the lungs daily.     [provider]  benzonatate (TESSALON) 100 MG capsule Take 2 capsules (200 mg total) by mouth every 8 (eight) hours. 03/04/20   Loura Halt A, NP  Betamethasone Valerate 0.12 % foam  08/29/17   [provider]  budesonide-formoterol (SYMBICORT) 160-4.5 MCG/ACT inhaler INHALE 2 PUFFS BY MOUTH 2 TIMES DAILY 03/28/20 03/28/21  Tiajuana Amass, MD  busPIRone (BUSPAR) 10 MG tablet Take 1 tablet (10 mg total) by mouth 3 (three) times daily 06/02/20     cetirizine (ZYRTEC) 10 MG tablet Take 10 mg by mouth 2 (two) times daily.     [provider]  clindamycin (CLEOCIN T) 1 % lotion  11/10/17    [provider]  COVID-19 At Home Antigen Test [facility] COVID-19 HOME TEST) KIT Use as directed on package 05/20/20   Edmon Crape, [facility]  dexlansoprazole (DEXILANT) 60 MG capsule TAKE 1 CAPSULE (60 MG TOTAL) BY MOUTH ONCE DAILY 12/14/19 12/13/20  Ok Edwards, NP  dexlansoprazole (DEXILANT) 60 MG capsule Take 1 capsule (60 mg total) by mouth once daily 06/13/20     diclofenac sodium (VOLTAREN) 1 % GEL Apply 1 application topically daily as needed for pain. 07/05/17   [provider]  doxycycline (MONODOX) 100 MG capsule TAKE 1 CAPSULE (100 MG TOTAL) BY MOUTH 2 (TWO) TIMES DAILY FOR 7 DAYS. WITH FOOD 06/19/20   Moye, [place], MD  DULoxetine (CYMBALTA) 60 MG capsule Take 1 capsule (60 mg total) by mouth 2 (two) times daily. 04/30/17   [name], MD  DULoxetine (CYMBALTA) 60 MG capsule TAKE 1  CAPSULE BY MOUTH 2 TIMES DAILY 12/06/19 12/14/20  [name], MD  DULoxetine (CYMBALTA) 60 MG capsule TAKE 1 CAPSULE BY MOUTH TWICE DAILY 09/09/19 09/08/20  [name], MD  famotidine (PEPCID) 40 MG tablet TAKE 1 TABLET (40 MG TOTAL) BY MOUTH NIGHTLY 12/14/19 12/13/20  Ok Edwards, NP  famotidine (PEPCID) 40 MG tablet Take 1 tablet (40 mg total) by mouth nightly 06/13/20     fluconazole (DIFLUCAN) 150 MG tablet Take one tablet as needed for yeast infection due to antibiotic use. Repeat in one week if needed. 11/23/19   Moye, [place], MD  Fluticasone Propionate, Inhal, 100 MCG/BLIST AEPB INHALE 1 PUFF INTO LUNGS TWICE DAILY. 12/06/19 12/05/20  [name], MD  gabapentin (NEURONTIN) 300 MG capsule TAKE 1 TO 3 CAPSULES BY MOUTH THREE TIMES DAILY 10/19/19 11/27/20  [name], MD  HYDROcodone-acetaminophen [facility]) 10-325 MG tablet Take 1 tablet by mouth every 6 (six) hours as needed.    [provider]  hydrocortisone 2.5 % cream TWICE DAILY TO AFFECTED AREA FOR UP TO 2 WEEKS AS NEEDED FOR RASH 06/19/20   Moye, [place], MD  imiquimod (ALDARA)  5 % cream AT BEDTIME FOR 2 WEEKS AS TOLERATED, 2 WEEKS OFF AND REPEAT 2 WEEKS AT BEDTIME. 06/19/20   Brendolyn Patty, MD  ivermectin (STROMECTOL) 3 MG TABS tablet Take 6 tablets by mouth with food for three days 10/02/19   Brendolyn Patty, MD  ivermectin (STROMECTOL) 3 MG TABS tablet Take 6 tablets by mouth daily with food for three days 10/02/19   Brendolyn Patty, MD  ketoconazole (NIZORAL) 2 % shampoo  12/02/17   [provider]  ketoconazole (NIZORAL) 2 % shampoo APPLY 1 APPLICATION TOPICALLY EVERY OTHER DAY. 07/30/19   Ralene Bathe, MD  levocetirizine (XYZAL) 5 MG tablet TAKE 1 TABLET BY MOUTH EVERY EVENING 09/09/19 09/22/20  [name], MD  levofloxacin (LEVAQUIN) 500 MG tablet Take 1 tablet (500 mg total) by mouth daily. 03/04/20   Loura Halt A, NP  methocarbamol (ROBAXIN) 750 MG tablet  02/22/18   [provider]  montelukast (SINGULAIR) 10 MG tablet Take 1 tablet (10 mg total) by mouth daily. 04/30/17   [name], MD  montelukast (SINGULAIR) 10 MG tablet TAKE 1 TABLET (10 MG TOTAL) BY MOUTH NIGHTLY 12/06/19 12/14/20  [name], MD  montelukast (SINGULAIR) 10 MG tablet TAKE 1 TABLET BY MOUTH ONCE DAILY AT NIGHT 09/09/19 09/08/20  [name], MD  mupirocin ointment (BACTROBAN) 2 % APPLY TO AFFECTED AREA TWICE A DAY 06/19/20   Moye, [place], MD  nitrofurantoin, macrocrystal-monohydrate, (MACROBID) 100 MG capsule TAKE 1 CAPSULE BY MOUTH ONCE DAILY AS NEEDED 12/06/19 12/05/20  [name], MD  nitrofurantoin, macrocrystal-monohydrate, (MACROBID) 100 MG capsule Take 1 capsule (100 mg total) by mouth once daily as needed 08/12/20     nystatin cream (MYCOSTATIN) Apply two to three times per day as needed 06/18/20   [facility], [place], MD  Olopatadine HCl 0.2 % SOLN Apply 1 drop to eye daily. Patient not taking: Reported on 08/10/2018 06/14/17   [name], MD  pantoprazole (PROTONIX) 40 MG tablet TAKE 1 TABLET BY MOUTH TWO TIMES DAILY 08/23/17   [name], MD   phenazopyridine (PYRIDIUM) 200 MG tablet TAKE 1 TABLET BY MOUTH 3 TIMES DAILY WITH MEALS AS NEEDED; DO NOT USE MORE THAN 3 DAYS AT A TIME 12/06/19 12/05/20  [name], MD  PREVIDENT 5000 DRY MOUTH 1.1 % GEL  dental gel Place 1 application onto teeth 2 (two) times daily. 08/03/17   [provider]  promethazine (PHENERGAN) 25 MG tablet TAKE 1 TABLET BY MOUTH EVERY 8 HOURS AS NEEDED FOR NAUSEA OR VOMITING 02/15/20 02/14/21  [name], MD  promethazine-dextromethorphan (PROMETHAZINE-DM) 6.25-15 MG/5ML syrup Take 5 mLs by mouth 4 (four) times daily as needed. 02/08/20   Kennyth Arnold, FNP  triamcinolone (KENALOG) 0.1 % paste Dry ulcer with tissue and then apply paste to affected area twice a day until no longer tender. 02/21/20   Moye, [place], MD    Allergies Cephalexin, Cephalosporins, and Penicillins  Family History  Problem Relation Age of Onset   Bipolar disorder Father    Transient ischemic attack Father    Dementia Father        secondary to head trauma   Hypertension Father    Diabetes Father    CAD Father    Cancer Mother 86       lung   Migraines Mother    Hypothyroidism Mother    Hypertension Sister    Atrial fibrillation Sister    Hypothyroidism Sister    Alcohol abuse Brother    Hypertension Brother    Alcohol abuse Brother    Atrial fibrillation Maternal Grandmother    COPD Maternal Grandmother    Diabetes Maternal Grandmother    Hyperlipidemia Maternal Grandmother    Diabetes Maternal Grandfather    Diabetes Paternal Grandfather    Heart disease Paternal Grandfather    Breast cancer Sister 21    Social History Social History   Tobacco Use   Smoking status: Former    Packs/day: 1.00    Years: 5.00    Pack years: 5.00    Types: Cigarettes   Smokeless tobacco: Never   Tobacco comments:    Quit 32 years ago  Vaping Use   Vaping Use: Never used  Substance Use Topics   Alcohol use: Yes    Comment: rare   Drug use: No    Review of  Systems  Constitutional: fever/chills Eyes: No visual changes. ENT: No sore throat. Cardiovascular: Denies chest pain. Respiratory:  shortness of breath. Gastrointestinal: No abdominal pain.  No nausea, no vomiting.  No diarrhea.  No constipation. Genitourinary: Negative for dysuria. Musculoskeletal: Negative for back pain. Skin: Negative for rash. Neurological: Negative for headaches, focal weakness  ____________________________________________   PHYSICAL EXAM:  VITAL SIGNS: ED Triage Vitals  Enc Vitals Group     BP 09/17/20 1345 (!) 166/95     Pulse Rate 09/17/20 1345 (!) 111     Resp 09/17/20 1345 20     Temp 09/17/20 1345 99.9 F (37.7 C)     Temp Source 09/17/20 1345 Oral     SpO2 09/17/20 1339 (!) 86 %     Weight 09/17/20 1344 245 lb 9.5 oz (111.4 kg)     Height 09/17/20 1344 5' 7"  (1.702 m)     Head Circumference --      Peak Flow --      Pain Score 09/17/20 1344 0     Pain Loc --      Pain Edu? --      Excl. in [place]? --     Constitutional: Alert and oriented. Well appearing and in no acute distress. Eyes: Conjunctivae are normal. PER. EOMI. Head: Atraumatic. Nose: No congestion/rhinnorhea. Mouth/Throat: Mucous membranes are moist.  Oropharynx non-erythematous. Neck: No stridor.  Cardiovascular: Rapid rate, regular rhythm. Grossly normal heart sounds.  Good  peripheral circulation. Respiratory: Normal respiratory effort.  No retractions. Lungs diffuse crackles and wheezes Gastrointestinal: Soft and nontender. No distention. No abdominal bruits. No CVA tenderness. Musculoskeletal: No lower extremity tenderness some puffiness bilaterally minimal edema pitting edema though. Neurologic:  Normal speech and language. No gross focal neurologic deficits are appreciated.  Rectal Hemoccult negative no masses palpated ____________________________________________   LABS (all labs ordered are listed, but only abnormal results are displayed)  Labs Reviewed  COMPREHENSIVE  METABOLIC PANEL - Abnormal; Notable for the following components:      Result Value   Glucose, Bld 110 (*)    Calcium 8.2 (*)    Total Protein 6.0 (*)    All other components within normal limits  CBC WITH DIFFERENTIAL/PLATELET - Abnormal; Notable for the following components:   WBC 15.1 (*)    RBC 2.03 (*)    Hemoglobin 7.0 (*)    HCT 22.1 (*)    MCV 108.9 (*)    MCH 34.5 (*)    RDW 15.9 (*)    Neutro Abs 10.8 (*)    Monocytes Absolute 1.3 (*)    Abs Immature Granulocytes 0.09 (*)    All other components within normal limits  APTT - Abnormal; Notable for the following components:   aPTT 37 (*)    All other components within normal limits  RESP PANEL BY RT-PCR (FLU A&B, COVID) ARPGX2  CULTURE, BLOOD (ROUTINE X 2)  CULTURE, BLOOD (ROUTINE X 2)  URINE CULTURE  PROTIME-INR  BRAIN NATRIURETIC PEPTIDE  LACTIC ACID, PLASMA  LACTIC ACID, PLASMA  URINALYSIS, COMPLETE (UACMP) WITH MICROSCOPIC  VITAMIN B12  FOLATE RBC  TYPE AND SCREEN   ____________________________________________  EKG  EKG read interpreted by me shows sinus tachycardia rate of 111 rightward axis nonspecific ST-T wave changes possible limb lead reversal.  EKG #2 since shows sinus tachycardia rate of 106 normal axis essentially normal EKG.  The limb leads were swapped on the first 1. ____________________________________________  RADIOLOGY Gertha Calkin, personally viewed and evaluated these images (plain radiographs) as part of my medical decision making, as well as reviewing the written report by the radiologist.  ED MD interpretation: Chest x-ray reviewed by me shows possible right-sided infiltrate.  Will await radiologist review.  Official radiology report(s): No results found.  ____________________________________________   PROCEDURES  Procedure(s) performed (including Critical Care):  Procedures   ____________________________________________   INITIAL IMPRESSION / ASSESSMENT AND PLAN /  ED COURSE Clinically patient has a pneumonia.  We have got her on antibiotics.  We will have to get her in the hospital as she is also hypoxic.  I have a ultrasound of the legs pending as well as a D-dimer.  Again clinically though she looks like a pneumonia.               ____________________________________________   FINAL CLINICAL IMPRESSION(S) / ED DIAGNOSES  Final diagnoses:  Hypoxia  Community acquired pneumonia of left lung, unspecified part of lung     ED Discharge Orders     None        Note:  This document was prepared using Dragon voice recognition software and may include unintentional dictation errors.    Nena Polio, MD 09/17/20 [phone]

## 2020-09-17 NOTE — ED Notes (Signed)
US at bedside

## 2020-09-18 ENCOUNTER — Inpatient Hospital Stay: Payer: 59

## 2020-09-18 ENCOUNTER — Encounter: Payer: Self-pay | Admitting: Internal Medicine

## 2020-09-18 DIAGNOSIS — D539 Nutritional anemia, unspecified: Secondary | ICD-10-CM | POA: Diagnosis present

## 2020-09-18 DIAGNOSIS — E119 Type 2 diabetes mellitus without complications: Secondary | ICD-10-CM | POA: Diagnosis present

## 2020-09-18 DIAGNOSIS — Z20822 Contact with and (suspected) exposure to covid-19: Secondary | ICD-10-CM | POA: Diagnosis present

## 2020-09-18 DIAGNOSIS — Z811 Family history of alcohol abuse and dependence: Secondary | ICD-10-CM | POA: Diagnosis not present

## 2020-09-18 DIAGNOSIS — J9601 Acute respiratory failure with hypoxia: Secondary | ICD-10-CM | POA: Diagnosis present

## 2020-09-18 DIAGNOSIS — E669 Obesity, unspecified: Secondary | ICD-10-CM | POA: Diagnosis present

## 2020-09-18 DIAGNOSIS — K648 Other hemorrhoids: Secondary | ICD-10-CM | POA: Diagnosis present

## 2020-09-18 DIAGNOSIS — M160 Bilateral primary osteoarthritis of hip: Secondary | ICD-10-CM | POA: Diagnosis not present

## 2020-09-18 DIAGNOSIS — J45909 Unspecified asthma, uncomplicated: Secondary | ICD-10-CM | POA: Diagnosis present

## 2020-09-18 DIAGNOSIS — G8929 Other chronic pain: Secondary | ICD-10-CM | POA: Diagnosis present

## 2020-09-18 DIAGNOSIS — Z801 Family history of malignant neoplasm of trachea, bronchus and lung: Secondary | ICD-10-CM | POA: Diagnosis not present

## 2020-09-18 DIAGNOSIS — Z8616 Personal history of COVID-19: Secondary | ICD-10-CM | POA: Diagnosis not present

## 2020-09-18 DIAGNOSIS — I10 Essential (primary) hypertension: Secondary | ICD-10-CM | POA: Diagnosis present

## 2020-09-18 DIAGNOSIS — Z803 Family history of malignant neoplasm of breast: Secondary | ICD-10-CM | POA: Diagnosis not present

## 2020-09-18 DIAGNOSIS — J811 Chronic pulmonary edema: Secondary | ICD-10-CM | POA: Diagnosis present

## 2020-09-18 DIAGNOSIS — Z6838 Body mass index (BMI) 38.0-38.9, adult: Secondary | ICD-10-CM | POA: Diagnosis not present

## 2020-09-18 DIAGNOSIS — M5136 Other intervertebral disc degeneration, lumbar region: Secondary | ICD-10-CM | POA: Diagnosis present

## 2020-09-18 DIAGNOSIS — K219 Gastro-esophageal reflux disease without esophagitis: Secondary | ICD-10-CM | POA: Diagnosis present

## 2020-09-18 DIAGNOSIS — F419 Anxiety disorder, unspecified: Secondary | ICD-10-CM | POA: Diagnosis present

## 2020-09-18 DIAGNOSIS — J189 Pneumonia, unspecified organism: Secondary | ICD-10-CM | POA: Diagnosis present

## 2020-09-18 DIAGNOSIS — F32A Depression, unspecified: Secondary | ICD-10-CM | POA: Diagnosis present

## 2020-09-18 DIAGNOSIS — E7800 Pure hypercholesterolemia, unspecified: Secondary | ICD-10-CM | POA: Diagnosis present

## 2020-09-18 DIAGNOSIS — A419 Sepsis, unspecified organism: Secondary | ICD-10-CM | POA: Diagnosis present

## 2020-09-18 DIAGNOSIS — N3090 Cystitis, unspecified without hematuria: Secondary | ICD-10-CM | POA: Diagnosis present

## 2020-09-18 DIAGNOSIS — E1142 Type 2 diabetes mellitus with diabetic polyneuropathy: Secondary | ICD-10-CM | POA: Diagnosis present

## 2020-09-18 LAB — HEMOGLOBIN AND HEMATOCRIT, BLOOD
HCT: 24.5 % — ABNORMAL LOW (ref 36.0–46.0)
Hemoglobin: 7.8 g/dL — ABNORMAL LOW (ref 12.0–15.0)

## 2020-09-18 LAB — BASIC METABOLIC PANEL
Anion gap: 6 (ref 5–15)
BUN: 12 mg/dL (ref 8–23)
CO2: 27 mmol/L (ref 22–32)
Calcium: 8.9 mg/dL (ref 8.9–10.3)
Chloride: 110 mmol/L (ref 98–111)
Creatinine, Ser: 0.66 mg/dL (ref 0.44–1.00)
GFR, Estimated: >60 mL/min (ref >60)
Glucose, Bld: 135 mg/dL — ABNORMAL HIGH (ref 70–99)
Potassium: 4.2 mmol/L (ref 3.5–5.1)
Sodium: 143 mmol/L (ref 135–145)

## 2020-09-18 LAB — PREPARE RBC (CROSSMATCH)

## 2020-09-18 LAB — CBC
HCT: 21.4 % — ABNORMAL LOW (ref 36.0–46.0)
Hemoglobin: 6.6 g/dL — ABNORMAL LOW (ref 12.0–15.0)
MCH: 34 pg (ref 26.0–34.0)
MCHC: 30.8 g/dL (ref 30.0–36.0)
MCV: 110.3 fL — ABNORMAL HIGH (ref 80.0–100.0)
Platelets: 268 10*3/uL (ref 150–400)
RBC: 1.94 MIL/uL — ABNORMAL LOW (ref 3.87–5.11)
RDW: 16.5 % — ABNORMAL HIGH (ref 11.5–15.5)
WBC: 10 10*3/uL (ref 4.0–10.5)
nRBC: 0 % (ref 0.0–0.2)

## 2020-09-18 LAB — FOLATE RBC
Folate, Hemolysate: 465 ng/mL
Folate, RBC: 2337 ng/mL (ref >498)
Hematocrit: 19.9 % — ABNORMAL LOW (ref 34.0–46.6)

## 2020-09-18 LAB — PROCALCITONIN: Procalcitonin: 0.19 ng/mL

## 2020-09-18 LAB — IRON AND TIBC
Iron: 34 ug/dL (ref 28–170)
Saturation Ratios: 14 % (ref 10.4–31.8)
TIBC: 251 ug/dL (ref 250–450)
UIBC: 217 ug/dL

## 2020-09-18 LAB — FERRITIN: Ferritin: 273 ng/mL (ref 11–307)

## 2020-09-18 MED ORDER — SULFAMETHOXAZOLE-TRIMETHOPRIM 800-160 MG PO TABS
1.0000 | ORAL_TABLET | Freq: Two times a day (BID) | ORAL | Status: DC
  Administered 2020-09-18 (×2): 1 via ORAL
  Filled 2020-09-18 (×3): qty 1

## 2020-09-18 MED ORDER — IPRATROPIUM-ALBUTEROL 0.5-2.5 (3) MG/3ML IN SOLN
3.0000 mL | RESPIRATORY_TRACT | Status: DC
  Administered 2020-09-18: 17:00:00 3 mL via RESPIRATORY_TRACT
  Filled 2020-09-18: qty 3

## 2020-09-18 MED ORDER — METHYLPREDNISOLONE SODIUM SUCC 40 MG IJ SOLR
40.0000 mg | Freq: Two times a day (BID) | INTRAMUSCULAR | Status: DC
  Administered 2020-09-18 – 2020-09-20 (×4): 40 mg via INTRAVENOUS
  Filled 2020-09-18 (×4): qty 1

## 2020-09-18 MED ORDER — SODIUM CHLORIDE 0.9% IV SOLUTION: Freq: Once | INTRAVENOUS | Status: AC

## 2020-09-18 MED ORDER — IOHEXOL 350 MG/ML SOLN
100.0000 mL | Freq: Once | INTRAVENOUS | Status: AC | PRN
  Administered 2020-09-18: 16:00:00 100 mL via INTRAVENOUS

## 2020-09-18 MED ORDER — IPRATROPIUM-ALBUTEROL 0.5-2.5 (3) MG/3ML IN SOLN
3.0000 mL | Freq: Three times a day (TID) | RESPIRATORY_TRACT | Status: DC
  Administered 2020-09-18 – 2020-09-21 (×8): 3 mL via RESPIRATORY_TRACT
  Filled 2020-09-18 (×9): qty 3

## 2020-09-18 NOTE — Progress Notes (Signed)
PROGRESS NOTE    Bethany Scott  [id number] DOB: [birth year] DOA: 09/17/2020 PCP: [name], MD   Brief Narrative: Taken from H&P Bethany Scott is a 65 y.o. female with medical history significant for non-insulin-dependent diabetes mellitus, history of hypertension, history of hyperlipidemia, depression, anxiety, GERD,chronic anemia, frequent cystitis, history of gastric bypass, presents the emergency department for chief concerns of shortness of breath, productive cough.  She does not use oxygen at baseline.  History of asthma. ROS was also positive for some dysuria, recently completed a course of Macrobid. There was also some concern of bilateral lower extremity edema-venous Doppler was negative. Also had an history of COVID-19 infection about a year ago.  On arrival she was hemodynamically stable.  Found to have leukocytosis at 15.1, hemoglobin of 7, chest x-ray concerning for bilateral opacities/pulmonary vascular congestion, borderline elevation of procalcitonin at 0.17, BNP within normal limit.  Troponin negative.  UA positive for leukocytes and nitrites, blood cultures negative so far and urine cultures pending.  Subjective: Patient continues to have some shortness of breath.  Has an history of asthma but does not use any oxygen.  No frequent exacerbations.  No prior diagnosis of COPD, quit smoking 36 years ago. Denies any obvious bleeding which includes hematemesis, melena, hematochezia or hematuria.  Per patient she did had her colonoscopy in the past 2 years and it was normal. Per patient she did had an history of anemia requiring transfusions before, could not find any documentation.  She never saw an hematologist.  She is postmenopausal and no postmenopausal bleeding either.  Assessment & Plan:   Principal Problem:   Sepsis ([place]) Active Problems:   Depression with anxiety   Essential hypertension, benign   Pure hypercholesterolemia   DDD (degenerative disc disease),  lumbar   Degenerative joint disease (DJD) of hip   S/P laparoscopic sleeve gastrectomy   Status post total hip replacement, right   Status post total hip replacement, left   Pelvic pain in female   Acute respiratory failure with hypoxia (HCC)  Acute hypoxic respiratory failure.  Not on home oxygen.  History of asthma and some wheezing today.  Chest x-ray with some bilateral infiltrates/pulmonary edema.  BNP within normal limits and troponin negative.  No prior diagnosis of any heart failure and she appears euvolemic.  Mildly elevated D-dimer, lower extremity venous Doppler were negative for DVT.  No prior history of venous clotting.  History of COVID-19 infection about a year ago.  Procalcitonin 0.17>>0.19 Received a dose of Levaquin in ED. -CTA for better characterization of bilateral opacities and to rule out PE. -Give her some Solu-Medrol -DuoNeb every 4 hourly -Albuterol nebulizer as needed. -Continue with Zithromax -Continue with supplemental oxygen-wean as tolerated. -Continue with supportive care.  Concern of UTI.  UA with nitrites and leukocytes.  Urine cultures pending.  Blood cultures negative so far.  Patient is allergic to penicillin and cephalosporin, documented as anaphylaxis. -Start her on Bactrim while waiting for urine cultures.  Macrocytic anemia.  No history of frequent alcohol use.  History of gastric bypass and iron deficiency in the past.  Per patient she has an history of anemia in the past but I could not find any documentation.  Colonoscopy with some internal hemorrhoids and EGD without significant abnormality in 2021.  Denies any obvious bleeding.  Hemoglobin at 6.6 this morning.  Decreased from 7 on admission. -Check FOBT -Give her 2 units of PRBC -Might need outpatient hematology evaluation. -TSH with borderline decreased at  0.315 -B12 within normal limit. -RBC folate pending -We will check iron studies because of her history of iron deficiency although  macrocytosis does not support it.  History of depression and anxiety.  No acute concern. -Continue home dose of buspirone and duloxetine  Chronic back pain.  Patient also saw his a neurologist for concern of hyperreflexia and clonus. Mild little exacerbation with UTI.  No incontinence, no tingling or numbness or focal weakness today. -Continue home dose of gabapentin. -Continue home Robaxin as needed -Continue home Norco as needed   Objective: Vitals:   09/18/20 0402 09/18/20 1045 09/18/20 1100 09/18/20 1233  BP: 124/65 108/62 106/61   Pulse: 94 95 87   Resp: '18 16 18   '$ Temp: 98.4 F (36.9 C) 98.6 F (37 C) 98.3 F (36.8 C)   TempSrc: Oral Oral Oral   SpO2: 92% 93% 92% 96%  Weight:      Height:        Intake/Output Summary (Last 24 hours) at 09/18/2020 1245 Last data filed at 09/18/2020 1028 Gross per 24 hour  Intake 3101.76 ml  Output --  Net 3101.76 ml   Filed Weights   09/17/20 1344  Weight: 111.4 kg    Examination:  General exam: Appears calm and comfortable  Respiratory system: Bilateral scattered expiratory wheeze. Respiratory effort normal. Cardiovascular system: S1 & S2 heard, RRR. No JVD, murmurs, rubs, gallops or clicks. Gastrointestinal system: Soft, nontender, nondistended, bowel sounds positive. Central nervous system: Alert and oriented. No focal neurological deficits. Extremities: No edema, no cyanosis, pulses intact and symmetrical. Psychiatry: Judgement and insight appear normal.   DVT prophylaxis: SCDs Code Status: Full Family Communication: Discussed with patient Disposition Plan:  Status is: Inpatient  Remains inpatient appropriate because:Inpatient level of care appropriate due to severity of illness  Dispo: The patient is from: Home              Anticipated d/c is to: Home              Patient currently is not medically stable to d/c.   Difficult to place patient No              Level of care: Med-Surg  All the records are reviewed  and case discussed with Care Management/Social Worker. Management plans discussed with the patient, nursing and they are in agreement.  Consultants:  None  Procedures:  Antimicrobials:  Zithromax Bactrim  Data Reviewed: I have personally reviewed following labs and imaging studies  CBC: Recent Labs  Lab 09/17/20 1349 09/18/20 0417  WBC 15.1* 10.0  NEUTROABS 10.8*  --   HGB 7.0* 6.6*  HCT 22.1* 21.4*  MCV 108.9* 110.3*  PLT 261 [id number]   Basic Metabolic Panel: Recent Labs  Lab 09/17/20 1349 09/18/20 0417  NA 138 143  K 3.6 4.2  CL 107 110  CO2 25 27  GLUCOSE 110* 135*  BUN 15 12  CREATININE 0.81 0.66  CALCIUM 8.2* 8.9   GFR: Estimated Creatinine Clearance: 91.4 mL/min (by C-G formula based on SCr of 0.66 mg/dL). Liver Function Tests: Recent Labs  Lab 09/17/20 1349  AST 26  ALT 17  ALKPHOS 64  BILITOT 1.1  PROT 6.0*  ALBUMIN 3.7   No results for input(s): LIPASE, AMYLASE in the last 168 hours. No results for input(s): AMMONIA in the last 168 hours. Coagulation Profile: Recent Labs  Lab 09/17/20 1349  INR 1.1   Cardiac Enzymes: No results for input(s): CKTOTAL, CKMB, CKMBINDEX, TROPONINI in the  last 168 hours. BNP (last 3 results) No results for input(s): PROBNP in the last 8760 hours. HbA1C: No results for input(s): HGBA1C in the last 72 hours. CBG: No results for input(s): GLUCAP in the last 168 hours. Lipid Profile: No results for input(s): CHOL, HDL, LDLCALC, TRIG, CHOLHDL, LDLDIRECT in the last 72 hours. Thyroid Function Tests: Recent Labs    09/17/20 1545  TSH 0.315*   Anemia Panel: Recent Labs    09/17/20 1545  VITAMINB12 859   Sepsis Labs: Recent Labs  Lab 09/17/20 1419 09/17/20 1545 09/18/20 0417  PROCALCITON  --  0.17 0.19  LATICACIDVEN 1.5 1.6  --     Recent Results (from the past 240 hour(s))  Resp Panel by RT-PCR (Flu A&B, Covid) Nasopharyngeal Swab     Status: None   Collection Time: 09/17/20  2:19 PM   Specimen:  Nasopharyngeal Swab; Nasopharyngeal(NP) swabs in vial transport medium  Result Value Ref Range Status   SARS Coronavirus 2 by RT PCR NEGATIVE NEGATIVE Final    Comment: (NOTE) SARS-CoV-2 target nucleic acids are NOT DETECTED.  The SARS-CoV-2 RNA is generally detectable in upper respiratory specimens during the acute phase of infection. The lowest concentration of SARS-CoV-2 viral copies this assay can detect is 138 copies/mL. A negative result does not preclude SARS-Cov-2 infection and should not be used as the sole basis for treatment or other patient management decisions. A negative result may occur with  improper specimen collection/handling, submission of specimen other than nasopharyngeal swab, presence of viral mutation(s) within the areas targeted by this assay, and inadequate number of viral copies(<138 copies/mL). A negative result must be combined with clinical observations, patient history, and epidemiological information. The expected result is Negative.  Fact Sheet for Patients:  EntrepreneurPulse.com.au  Fact Sheet for Healthcare Providers:  IncredibleEmployment.be  This test is no t yet approved or cleared by the [place] FDA and  has been authorized for detection and/or diagnosis of SARS-CoV-2 by FDA under an Emergency Use Authorization (EUA). This EUA will remain  in effect (meaning this test can be used) for the duration of the COVID-19 declaration under Section 564(b)(1) of the Act, 21 U.S.C.section 360bbb-3(b)(1), unless the authorization is terminated  or revoked sooner.       Influenza A by PCR NEGATIVE NEGATIVE Final   Influenza B by PCR NEGATIVE NEGATIVE Final    Comment: (NOTE) The Xpert Xpress SARS-CoV-2/FLU/RSV plus assay is intended as an aid in the diagnosis of influenza from Nasopharyngeal swab specimens and should not be used as a sole basis for treatment. Nasal washings and aspirates are unacceptable for  Xpert Xpress SARS-CoV-2/FLU/RSV testing.  Fact Sheet for Patients: EntrepreneurPulse.com.au  Fact Sheet for Healthcare Providers: IncredibleEmployment.be  This test is not yet approved or cleared by the [place] FDA and has been authorized for detection and/or diagnosis of SARS-CoV-2 by FDA under an Emergency Use Authorization (EUA). This EUA will remain in effect (meaning this test can be used) for the duration of the COVID-19 declaration under Section 564(b)(1) of the Act, 21 U.S.C. section 360bbb-3(b)(1), unless the authorization is terminated or revoked.  Performed at [facility], [address] [place], [place] [address]   Blood Culture (routine x 2)     Status: None (Preliminary result)   Collection Time: 09/17/20  2:19 PM   Specimen: BLOOD  Result Value Ref Range Status   Specimen Description BLOOD BLOOD LEFT HAND  Final   Special Requests   Final    BOTTLES  DRAWN AEROBIC AND ANAEROBIC Blood Culture results may not be optimal due to an inadequate volume of blood received in culture bottles   Culture   Final    NO GROWTH < 24 HOURS Performed at [facility], [address] [place], [place] [address]    Report Status PENDING  Incomplete  Blood Culture (routine x 2)     Status: None (Preliminary result)   Collection Time: 09/17/20  2:19 PM   Specimen: BLOOD  Result Value Ref Range Status   Specimen Description BLOOD LEFT ANTECUBITAL  Final   Special Requests   Final    BOTTLES DRAWN AEROBIC AND ANAEROBIC Blood Culture adequate volume   Culture   Final    NO GROWTH < 24 HOURS Performed at [facility], [address], [place], [place] [address]    Report Status PENDING  Incomplete     Radiology Studies: US Venous Img Lower Bilateral  Result Date: 09/17/2020 CLINICAL DATA:  Leg swelling x1 week. EXAM: BILATERAL LOWER EXTREMITY VENOUS DOPPLER ULTRASOUND TECHNIQUE: Gray-scale sonography with  compression, as well as color and duplex ultrasound, were performed to evaluate the deep venous system(s) from the level of the common femoral vein through the popliteal and proximal calf veins. COMPARISON:  None. FINDINGS: VENOUS Normal compressibility of the common femoral, superficial femoral, and popliteal veins, as well as the visualized calf veins. Visualized portions of profunda femoral vein and great saphenous vein unremarkable. No filling defects to suggest DVT on grayscale or color Doppler imaging. Doppler waveforms show normal direction of venous flow, normal respiratory phasicity and response to augmentation. OTHER Bilateral subcutaneous calf edema. Limitations: none IMPRESSION: No femoropopliteal DVT nor evidence of DVT within the visualized calf veins. If clinical symptoms are inconsistent or if there are persistent or worsening symptoms, further imaging (possibly involving the iliac veins) may be warranted. Electronically Signed   By: Lucrezia Europe M.D.   On: 09/17/2020 15:33   DG Chest Port 1 View  Result Date: 09/17/2020 CLINICAL DATA:  Sepsis. EXAM: PORTABLE CHEST 1 VIEW COMPARISON:  11/14/2015 FINDINGS: Heart size appears normal. No pleural effusion. Diffuse pulmonary vascular congestion identified. No superimposed airspace consolidation. The visualized osseous structures appear unremarkable. IMPRESSION: Diffuse pulmonary vascular congestion. Electronically Signed   By: Kerby Moors M.D.   On: 09/17/2020 15:08    Scheduled Meds:  sodium chloride   Intravenous Once   busPIRone  10 mg Oral TID   DULoxetine  60 mg Oral BID   famotidine  40 mg Oral QHS   gabapentin  300 mg Oral BID   ipratropium-albuterol  3 mL Nebulization Q4H   loratadine  10 mg Oral Daily   mometasone-formoterol  2 puff Inhalation BID   montelukast  10 mg Oral QHS   pantoprazole  80 mg Oral Daily   Continuous Infusions:  azithromycin       LOS: 0 days   Time spent: 45 minutes. More than 50% of the time was  spent in counseling/coordination of care  Lorella Nimrod, MD [facility]  If 7PM-7AM, please contact night-coverage [email or web address]  09/18/2020, 12:45 PM   This record has been created using Systems analyst. Errors have been sought and corrected,but may not always be located. Such creation errors do not reflect on the standard of care.

## 2020-09-18 NOTE — Consult Note (Signed)
Pharmacy Antibiotic Note  Bethany Scott is a 65 y.o. female admitted on 09/17/2020 with UTI.  Pharmacy has been consulted for bactrim dosing.  Pt met sepsis criteria w/ possible pneumonia vs urine source. UA postive for nitrites, no bacteria or LE, with 0-5 WBC. Urine and blood cultures no growth <24h.   Pt has remained afebrile and WBC have improved from 15.1>10. Renal function is stable (Scr=0.66).   Plan: -Will start bactrim DS (860m/160mg) PO q12h -Continue to monitor cultures and deescalate as indicated -Will continue to monitor renal function and adjust dose as clinically indicated   Height: 5' 7"  (170.2 cm) Weight: 111.4 kg (245 lb 9.5 oz) IBW/kg (Calculated) : 61.6  Temp (24hrs), Avg:99.1 F (37.3 C), Min:98.3 F (36.8 C), Max:99.9 F (37.7 C)  Recent Labs  Lab 09/17/20 1349 09/17/20 1419 09/17/20 1545 09/18/20 0417  WBC 15.1*  --   --  10.0  CREATININE 0.81  --   --  0.66  LATICACIDVEN  --  1.5 1.6  --     Estimated Creatinine Clearance: 91.4 mL/min (by C-G formula based on SCr of 0.66 mg/dL).    Allergies  Allergen Reactions   Cephalexin Anaphylaxis   Cephalosporins Anaphylaxis   Penicillins Anaphylaxis and Other (See Comments)    Has patient had a PCN reaction causing immediate rash, facial/tongue/throat swelling, SOB or lightheadedness with hypotension: Yes Has patient had a PCN reaction causing severe rash involving mucus membranes or skin necrosis: No Has patient had a PCN reaction that required hospitalization Yes Has patient had a PCN reaction occurring within the last 10 years: No If all of the above answers are "NO", then may proceed with Cephalosporin use.     Antimicrobials this admission: Levofloxacin  08/24 >> 08/24 (x1) Bactrim 08/25 >>    Microbiology results: 08/24 BCx: no growth <24h 08/24 UCx: no growth <24h   Thank you for allowing pharmacy to be a part of this patient's care.  BNarda Rutherford PharmD Pharmacy Resident   09/18/2020 2:11 PM

## 2020-09-19 ENCOUNTER — Inpatient Hospital Stay
Admit: 2020-09-19 | Discharge: 2020-09-19 | Disposition: A | Discharge status: Home / Self Care | Payer: 59 | Attending: Pulmonary Disease | Admitting: Pulmonary Disease

## 2020-09-19 LAB — CBC
HCT: 24.4 % — ABNORMAL LOW (ref 36.0–46.0)
Hemoglobin: 7.8 g/dL — ABNORMAL LOW (ref 12.0–15.0)
MCH: 32.6 pg (ref 26.0–34.0)
MCHC: 32 g/dL (ref 30.0–36.0)
MCV: 102.1 fL — ABNORMAL HIGH (ref 80.0–100.0)
Platelets: 266 10*3/uL (ref 150–400)
RBC: 2.39 MIL/uL — ABNORMAL LOW (ref 3.87–5.11)
RDW: 19.2 % — ABNORMAL HIGH (ref 11.5–15.5)
WBC: 9.6 10*3/uL (ref 4.0–10.5)
nRBC: 0 % (ref 0.0–0.2)

## 2020-09-19 LAB — ECHOCARDIOGRAM COMPLETE
AR max vel: 1.88 cm2
AV Area VTI: 2.18 cm2
AV Area mean vel: 1.8 cm2
AV Mean grad: 4.5 mmHg
AV Peak grad: 9.6 mmHg
Ao pk vel: 1.55 m/s
Area-P 1/2: 6.77 cm2
Height: 67 in
S' Lateral: 3.5 cm
Weight: 3929.48 oz

## 2020-09-19 LAB — RESPIRATORY PANEL BY PCR

## 2020-09-19 LAB — URINE CULTURE: Culture: NO GROWTH

## 2020-09-19 LAB — BASIC METABOLIC PANEL
Anion gap: 5 (ref 5–15)
BUN: 12 mg/dL (ref 8–23)
CO2: 29 mmol/L (ref 22–32)
Calcium: 8.4 mg/dL — ABNORMAL LOW (ref 8.9–10.3)
Chloride: 106 mmol/L (ref 98–111)
Creatinine, Ser: 0.58 mg/dL (ref 0.44–1.00)
GFR, Estimated: >60 mL/min (ref >60)
Glucose, Bld: 145 mg/dL — ABNORMAL HIGH (ref 70–99)
Potassium: 3.8 mmol/L (ref 3.5–5.1)
Sodium: 140 mmol/L (ref 135–145)

## 2020-09-19 LAB — SURGICAL PCR SCREEN
MRSA, PCR: NEGATIVE
Staphylococcus aureus: NEGATIVE

## 2020-09-19 LAB — BPAM RBC
Blood Product Expiration Date: 202208272359
ISSUE DATE / TIME: 202208251039
Unit Type and Rh: 9500

## 2020-09-19 LAB — STREP PNEUMONIAE URINARY ANTIGEN: Strep Pneumo Urinary Antigen: NEGATIVE

## 2020-09-19 LAB — PROCALCITONIN
Procalcitonin: 0.13 ng/mL
Procalcitonin: 0.17 ng/mL

## 2020-09-19 LAB — C-REACTIVE PROTEIN: CRP: 11 mg/dL — ABNORMAL HIGH (ref <1.0)

## 2020-09-19 LAB — TYPE AND SCREEN
ABO/RH(D): O NEG
Antibody Screen: NEGATIVE
Unit division: 0

## 2020-09-19 MED ORDER — FUROSEMIDE 10 MG/ML IJ SOLN
40.0000 mg | Freq: Every day | INTRAMUSCULAR | Status: DC
  Administered 2020-09-19 – 2020-09-21 (×3): 40 mg via INTRAVENOUS
  Filled 2020-09-19 (×3): qty 4

## 2020-09-19 MED ORDER — FOLIC ACID 5 MG/ML IJ SOLN
1.0000 mg | Freq: Every day | INTRAMUSCULAR | Status: DC
  Administered 2020-09-19 – 2020-09-21 (×3): 1 mg via INTRAVENOUS
  Filled 2020-09-19 (×4): qty 0.2

## 2020-09-19 MED ORDER — LEVOFLOXACIN IN D5W 750 MG/150ML IV SOLN
750.0000 mg | INTRAVENOUS | Status: DC
  Administered 2020-09-20 (×2): 750 mg via INTRAVENOUS
  Filled 2020-09-19 (×3): qty 150

## 2020-09-19 NOTE — Consult Note (Signed)
Pulmonary Medicine          Date: 09/19/2020,   MRN# [id number] Bethany Scott 07-Dec-1955     AdmissionWeight: 111.4 kg                 CurrentWeight: 111.4 kg    Referring physician: Dr Reesa Chew  CHIEF COMPLAINT:   Acute hypoxemic respiratory failure   HISTORY OF PRESENT ILLNESS   Bethany Scott is a 65 y.o. female with medical history significant for non-insulin-dependent diabetes mellitus, history of hypertension, history of hyperlipidemia, depression, anxiety, GERD,chronic anemia, frequent cystitis, presents the emergency department for sudden onset SOB. She smoked in the past but quit 30 years ago.   Her sister never smoked died from lung cancer at age 61.   Other family members died from lung disease.   She had covid 19 year ago and took ivermectin   She is fatigued with anemia, she still works and work for [employer] in [employer].   She had severe anemia required blood transfusion yesterday now feeling better.   CT chest shows consolidated infiltrate on RUL with cardiomegaly sugestive on pneumonia.    Past Medical History:  Diagnosis Date   Allergy    Anemia    Anxiety    Arthritis    OA Knee; non-specific autoimmune process followed by [facility]/Rheumatology.   Asthma    Blood transfusion without reported diagnosis YRS AGO   COVID-19 03/2018   Depression    Diabetes mellitus without complication (HCC)    Gastric ulcer, unspecified as acute or chronic, without mention of hemorrhage, perforation, or obstruction YRS AGO   GERD (gastroesophageal reflux disease)    Hyperlipidemia    Hypertension    Migraine    hx of migraines   Obesity, unspecified    Other chronic cystitis    Personal history of colonic polyps    PONV (postoperative nausea and vomiting)    NAUSEA, SCOPOLAMINE PATCH HELPED WITH LAST SURGERIES   Pre-diabetes      SURGICAL HISTORY   Past Surgical History:  Procedure Laterality Date   ABDOMINAL HYSTERECTOMY   01/25/1994   uterine fibroids; DUB; cervical dysplasia; ovaries resected two years later.     APPENDECTOMY     BILATERAL OOPHORECTOMY  2001   CESAREAN SECTION     x 2   COLON SURGERY  YRS AGO   SIGMOIDECTOMY   Colonoscopy  09/08/2012   diverticulosis, hemorrhoids.  [name].  Symptoms:  anemia, hemoccult +.   ESOPHAGOGASTRODUODENOSCOPY  09/08/2012   +HH.  [name].  Symptoms: anemia, hemoccult +.   HIP ARTHROSCOPY Right    LAPAROSCOPIC GASTRIC SLEEVE RESECTION N/A 03/01/2016   Procedure: LAPAROSCOPIC GASTRIC SLEEVE RESECTION, UPPER ENDO;  Surgeon: [name], MD;  Location: [facility];  Service: General;  Laterality: N/A;   LAPAROSCOPIC LYSIS OF ADHESIONS N/A 02/10/2016   Procedure: LAPAROSCOPIC LYSIS OF ADHESIONS;  Surgeon: [name], MD;  Location: [facility];  Service: General;  Laterality: N/A;   LAPAROSCOPY N/A 02/10/2016   Procedure: LAPAROSCOPY DIAGNOSTIC;  Surgeon: [name], MD;  Location: [facility];  Service: General;  Laterality: N/A;   NASAL SEPTUM SURGERY     NECK SURGERY     Rheumatology Consult  11/26/2011   multiple arthralgias. Autoimmune labs negative.  Rx for Plaquenil for non-specific autoimmune process.  [name].   SIGMOID RESECTION / RECTOPEXY     SPINE SURGERY     TONSILLECTOMY AND ADENOIDECTOMY     TOTAL HIP ARTHROPLASTY  Right 09/27/2017   Procedure: TOTAL HIP ARTHROPLASTY ANTERIOR APPROACH;  Surgeon: [name], MD;  Location: [facility];  Service: Orthopedics;  Laterality: Right;   TOTAL HIP ARTHROPLASTY Left 12/27/2017   Procedure: TOTAL HIP ARTHROPLASTY ANTERIOR APPROACH;  Surgeon: [name], MD;  Location: [facility];  Service: Orthopedics;  Laterality: Left;     FAMILY HISTORY   Family History  Problem Relation Age of Onset   Bipolar disorder Father    Transient ischemic attack Father    Dementia Father        secondary to head trauma   Hypertension Father    Diabetes Father    CAD Father    Cancer Mother 2       lung   Migraines Mother     Hypothyroidism Mother    Hypertension Sister    Atrial fibrillation Sister    Hypothyroidism Sister    Alcohol abuse Brother    Hypertension Brother    Alcohol abuse Brother    Atrial fibrillation Maternal Grandmother    COPD Maternal Grandmother    Diabetes Maternal Grandmother    Hyperlipidemia Maternal Grandmother    Diabetes Maternal Grandfather    Diabetes Paternal Grandfather    Heart disease Paternal Grandfather    Breast cancer Sister 73     SOCIAL HISTORY   Social History   Tobacco Use   Smoking status: Former    Packs/day: 1.00    Years: 5.00    Pack years: 5.00    Types: Cigarettes   Smokeless tobacco: Never   Tobacco comments:    Quit 32 years ago  Vaping Use   Vaping Use: Never used  Substance Use Topics   Alcohol use: Yes    Comment: rare   Drug use: No     MEDICATIONS    Home Medication:    Current Medication:  Current Facility-Administered Medications:    acetaminophen (TYLENOL) tablet 650 mg, 650 mg, Oral, Q6H PRN **OR** acetaminophen (TYLENOL) suppository 650 mg, 650 mg, Rectal, Q6H PRN, Cox, Amy N, DO   albuterol (PROVENTIL) (2.5 MG/3ML) 0.083% nebulizer solution 3 mL, 3 mL, Inhalation, Q6H PRN, Cox, Amy N, DO, 3 mL at 09/18/20 1230   azithromycin (ZITHROMAX) 500 mg in sodium chloride 0.9 % 250 mL IVPB, 500 mg, Intravenous, Q24H, Cox, Amy N, DO, Last Rate: 250 mL/hr at 09/18/20 1412, 500 mg at 09/18/20 1412   busPIRone (BUSPAR) tablet 10 mg, 10 mg, Oral, TID, Cox, Amy N, DO, 10 mg at 09/19/20 0951   DULoxetine (CYMBALTA) DR capsule 60 mg, 60 mg, Oral, BID, Cox, Amy N, DO, 60 mg at 09/19/20 0952   famotidine (PEPCID) tablet 40 mg, 40 mg, Oral, QHS, Cox, Amy N, DO, 40 mg at 73/56/70 1410   folic acid injection 1 mg, 1 mg, Intravenous, Daily, Lorella Nimrod, MD, 1 mg at 09/19/20 3013   gabapentin (NEURONTIN) capsule 300 mg, 300 mg, Oral, BID, Cox, Amy N, DO, 300 mg at 09/19/20 1438   HYDROcodone-acetaminophen (NORCO/VICODIN) 5-325 MG per tablet 2  tablet, 2 tablet, Oral, Q6H PRN, Dorothe Pea, RPH, 2 tablet at 09/19/20 8875   ipratropium-albuterol (DUONEB) 0.5-2.5 (3) MG/3ML nebulizer solution 3 mL, 3 mL, Nebulization, TID, Lorella Nimrod, MD, 3 mL at 09/19/20 0711   [START ON 09/20/2020] levofloxacin (LEVAQUIN) IVPB 750 mg, 750 mg, Intravenous, Q24H, Deal, Brandon G, RPH   loratadine (CLARITIN) tablet 10 mg, 10 mg, Oral, Daily, Cox, Amy N, DO, 10 mg at 09/19/20 0951   methocarbamol (ROBAXIN)  tablet 750 mg, 750 mg, Oral, TID PRN, Cox, Amy N, DO   methylPREDNISolone sodium succinate (SOLU-MEDROL) 40 mg/mL injection 40 mg, 40 mg, Intravenous, Q12H, Amin, Soundra Pilon, MD, 40 mg at 09/19/20 0300   mometasone-formoterol (DULERA) 200-5 MCG/ACT inhaler 2 puff, 2 puff, Inhalation, BID, Cox, Amy N, DO, 2 puff at 09/19/20 0826   montelukast (SINGULAIR) tablet 10 mg, 10 mg, Oral, QHS, Cox, Amy N, DO, 10 mg at 09/18/20 2144   ondansetron (ZOFRAN) tablet 4 mg, 4 mg, Oral, Q6H PRN **OR** ondansetron (ZOFRAN) injection 4 mg, 4 mg, Intravenous, Q6H PRN, Cox, Amy N, DO   pantoprazole (PROTONIX) EC tablet 80 mg, 80 mg, Oral, Daily, Cox, Amy N, DO, 80 mg at 09/19/20 2707   phenazopyridine (PYRIDIUM) tablet 200 mg, 200 mg, Oral, TID PRN, Cox, Amy N, DO    ALLERGIES   Cephalexin, Cephalosporins, and Penicillins     REVIEW OF SYSTEMS    Review of Systems:  Gen:  Denies  fever, sweats, chills weigh loss  HEENT: Denies blurred vision, double vision, ear pain, eye pain, hearing loss, nose bleeds, sore throat Cardiac:  No dizziness, chest pain or heaviness, chest tightness,edema Resp:   Denies cough or sputum porduction, shortness of breath,wheezing, hemoptysis,  Gi: Denies swallowing difficulty, stomach pain, nausea or vomiting, diarrhea, constipation, bowel incontinence Gu:  Denies bladder incontinence, burning urine Ext:   Denies Joint pain, stiffness or swelling Skin: Denies  skin rash, easy bruising or bleeding or hives Endoc:  Denies polyuria,  polydipsia , polyphagia or weight change Psych:   Denies depression, insomnia or hallucinations   Other:  All other systems negative   VS: BP (!) 130/59 (BP Location: Right Arm)   Pulse (!) 118   Temp 98.2 F (36.8 C) (Oral)   Resp 17   Ht 5' 7"  (1.702 m)   Wt 111.4 kg   SpO2 97%   BMI 38.47 kg/m      PHYSICAL EXAM    GENERAL:NAD, no fevers, chills, no weakness no fatigue HEAD: Normocephalic, atraumatic.  EYES: Pupils equal, round, reactive to light. Extraocular muscles intact. No scleral icterus.  MOUTH: Moist mucosal membrane. Dentition intact. No abscess noted.  EAR, NOSE, THROAT: Clear without exudates. No external lesions.  NECK: Supple. No thyromegaly. No nodules. No JVD.  PULMONARY: Diffuse coarse rhonchi right sided +wheezes CARDIOVASCULAR: S1 and S2. Regular rate and rhythm. No murmurs, rubs, or gallops. No edema. Pedal pulses 2+ bilaterally.  GASTROINTESTINAL: Soft, nontender, nondistended. No masses. Positive bowel sounds. No hepatosplenomegaly.  MUSCULOSKELETAL: No swelling, clubbing, or edema. Range of motion full in all extremities.  NEUROLOGIC: Cranial nerves II through XII are intact. No gross focal neurological deficits. Sensation intact. Reflexes intact.  SKIN: No ulceration, lesions, rashes, or cyanosis. Skin warm and dry. Turgor intact.  PSYCHIATRIC: Mood, affect within normal limits. The patient is awake, alert and oriented x 3. Insight, judgment intact.       IMAGING    CT Angio Chest Pulmonary Embolism (PE) W or WO Contrast  Result Date: 09/18/2020 CLINICAL DATA:  Rule out pulmonary embolus. Patient presents with hypertension and positive D-dimer. EXAM: CT ANGIOGRAPHY CHEST WITH CONTRAST TECHNIQUE: Multidetector CT imaging of the chest was performed using the standard protocol during bolus administration of intravenous contrast. Multiplanar CT image reconstructions and MIPs were obtained to evaluate the vascular anatomy. CONTRAST:  129m OMNIPAQUE  IOHEXOL 350 MG/ML SOLN COMPARISON:  None. FINDINGS: Cardiovascular: Suboptimal pulmonary arterial opacification is noted. Within this limitation no signs of central  obstructing pulmonary embolus. No suspicious filling defects identified within the lobar pulmonary arteries. Beyond the level of the lobar pulmonary arteries there is too much motion artifact and suboptimal pulmonary opacification to adequately assess for PE. Heart size appears normal. No pericardial effusion. Aortic atherosclerosis. Mediastinum/Nodes: No enlarged mediastinal, hilar, or axillary lymph nodes. Thyroid gland, trachea, and esophagus demonstrate no significant findings. Lungs/Pleura: Trace right pleural effusion. Extensive right upper lobe airspace densities and ground-glass attenuation which in the acute setting is concerning for pneumonia. Within the right middle lobe there is a nodular density measuring 0.9 cm, image 56/6. Upper Abdomen: No acute abnormalities. Postoperative changes are noted involving the stomach. Musculoskeletal: There is degenerative disc disease within the thoracic spine. No acute or suspicious findings identified. Review of the MIP images confirms the above findings. IMPRESSION: 1. Suboptimal pulmonary arterial opacification is noted. Within this limitation no signs of central obstructing pulmonary embolus. Beyond the level of the lobar pulmonary arteries there is too much motion artifact and suboptimal pulmonary opacification to adequately assess for PE. 2. Extensive right upper lobe airspace densities and ground-glass attenuation is concerning for pneumonia. Follow up CT of the chest in 3 months, after appropriate antibiotic therapy, is recommended to ensure complete resolution and to rule out underlying malignancy. 3. 9 mm nodule in the right middle lobe is indeterminate. Attention on post therapy follow-up imaging CT 4. Aortic atherosclerosis. Aortic Atherosclerosis (ICD10-I70.0). Electronically Signed   By:  Kerby Moors M.D.   On: 09/18/2020 16:49   US Venous Img Lower Bilateral  Result Date: 09/17/2020 CLINICAL DATA:  Leg swelling x1 week. EXAM: BILATERAL LOWER EXTREMITY VENOUS DOPPLER ULTRASOUND TECHNIQUE: Gray-scale sonography with compression, as well as color and duplex ultrasound, were performed to evaluate the deep venous system(s) from the level of the common femoral vein through the popliteal and proximal calf veins. COMPARISON:  None. FINDINGS: VENOUS Normal compressibility of the common femoral, superficial femoral, and popliteal veins, as well as the visualized calf veins. Visualized portions of profunda femoral vein and great saphenous vein unremarkable. No filling defects to suggest DVT on grayscale or color Doppler imaging. Doppler waveforms show normal direction of venous flow, normal respiratory phasicity and response to augmentation. OTHER Bilateral subcutaneous calf edema. Limitations: none IMPRESSION: No femoropopliteal DVT nor evidence of DVT within the visualized calf veins. If clinical symptoms are inconsistent or if there are persistent or worsening symptoms, further imaging (possibly involving the iliac veins) may be warranted. Electronically Signed   By: Lucrezia Europe M.D.   On: 09/17/2020 15:33   DG Chest Port 1 View  Result Date: 09/17/2020 CLINICAL DATA:  Sepsis. EXAM: PORTABLE CHEST 1 VIEW COMPARISON:  11/14/2015 FINDINGS: Heart size appears normal. No pleural effusion. Diffuse pulmonary vascular congestion identified. No superimposed airspace consolidation. The visualized osseous structures appear unremarkable. IMPRESSION: Diffuse pulmonary vascular congestion. Electronically Signed   By: Kerby Moors M.D.   On: 09/17/2020 15:08      ASSESSMENT/PLAN   Acute hypoxemic respiratory failure - present on admission -empirically on levoquin and steroids - COVID19 -none   - supplemental O2 during my evaluation  - 2L/min  - will perform infectious workup for  pneumonia -Respiratory viral panel -serum fungitell -legionella ab-in process  -strep pneumoniae ur AG--in process -Histoplasma Ur Ag -sputum resp cultures -AFB sputum expectorated specimen -sputum cytology  -reviewed pertinent imaging with patient today - ESR -patient reports acute onset LE edema - will recheck TTE - noted BNP is normal    -please encourage patient  to use incentive spirometer few times each hour while hospitalized.        Thank you for allowing me to participate in the care of this patient.  Total face to face encounter time for this patient visit was 45 min. >50% of the time was  spent in counseling and coordination of care.   Patient/Family are satisfied with care plan and all questions have been answered.  This document was prepared using Dragon voice recognition software and may include unintentional dictation errors.     Ottie Glazier, M.D.  Division of [place]

## 2020-09-19 NOTE — Progress Notes (Signed)
PROGRESS NOTE    Bethany Scott  [id number] DOB: [birth year] DOA: 09/17/2020 PCP: [name], MD   Brief Narrative: Taken from H&P Bethany Scott is a 65 y.o. female with medical history significant for non-insulin-dependent diabetes mellitus, history of hypertension, history of hyperlipidemia, depression, anxiety, GERD,chronic anemia, frequent cystitis, history of gastric bypass, presents the emergency department for chief concerns of shortness of breath, productive cough.  She does not use oxygen at baseline.  History of asthma. ROS was also positive for some dysuria, recently completed a course of Macrobid. There was also some concern of bilateral lower extremity edema-venous Doppler was negative. Also had an history of COVID-19 infection about a year ago.  On arrival she was hemodynamically stable.  Found to have leukocytosis at 15.1, hemoglobin of 7, chest x-ray concerning for bilateral opacities/pulmonary vascular congestion, borderline elevation of procalcitonin at 0.17, BNP within normal limit.  Troponin negative.  UA positive for leukocytes and nitrites, blood and urine cultures negative.  CTA was negative for PE but did show extensive right-sided infiltrate and groundglass opacities, concerning for pneumonia.  Pulmonary was also consulted-pending pneumonia work-up. Echocardiogram within normal limits  Subjective: Patient was seen and examined today.  Feeling little improving, still coughing.  Assessment & Plan:   Principal Problem:   Sepsis ([place]) Active Problems:   Depression with anxiety   Essential hypertension, benign   Pure hypercholesterolemia   DDD (degenerative disc disease), lumbar   Degenerative joint disease (DJD) of hip   S/P laparoscopic sleeve gastrectomy   Status post total hip replacement, right   Status post total hip replacement, left   Pelvic pain in female   Acute respiratory failure with hypoxia (HCC)  Acute hypoxic respiratory failure.  Not  on home oxygen.  Chest x-ray with some bilateral infiltrates/pulmonary edema.  BNP within normal limits and troponin negative.  No prior diagnosis of any heart failure and she appears euvolemic.  Mildly elevated D-dimer, lower extremity venous Doppler were negative for DVT.  No prior history of venous clotting.  History of COVID-19 infection about a year ago.  Procalcitonin 0.17>>0.19 Received a dose of Levaquin in ED. -CTA with no PE but shows extensive right-sided infiltrate and groundglass opacities.  No -Continue Solu-Medrol -DuoNeb every 4 hourly -Start her on Levaquin -Pneumonia work-up -Albuterol nebulizer as needed. -Continue with Zithromax -Continue with supplemental oxygen-wean as tolerated. -Continue with supportive care.  Concern of UTI.  UA with nitrites and leukocytes.  Urine cultures negative.  Blood cultures negative so far.  Patient is allergic to penicillin and cephalosporin, documented as anaphylaxis. -Bactrim was discontinued.  Macrocytic anemia.  No history of frequent alcohol use.  History of gastric bypass and iron deficiency in the past.  Per patient she has an history of anemia in the past but I could not find any documentation.  Colonoscopy with some internal hemorrhoids and EGD without significant abnormality in 2021.  Denies any obvious bleeding.  Hemoglobin at 7.8 this morning.  RBC folate low. B12 and iron studies within normal limit.-TSH with borderline decreased at 0.315 -Check FOBT-still pending -Received 2 units of PRBC -Start her on folic acid replacement, IV while in the hospital and p.o. on discharge. -Might need outpatient hematology evaluation.  History of depression and anxiety.  No acute concern. -Continue home dose of buspirone and duloxetine  Chronic back pain.  Patient also saw his a neurologist for concern of hyperreflexia and clonus. Mild little exacerbation with UTI.  No incontinence, no tingling or numbness  or focal weakness today. -Continue  home dose of gabapentin. -Continue home Robaxin as needed -Continue home Norco as needed   Objective: Vitals:   09/19/20 0713 09/19/20 1148 09/19/20 1354 09/19/20 1503  BP:  (!) 130/59  130/67  Pulse:  (!) 118  (!) 116  Resp:  17  20  Temp:  98.2 F (36.8 C)  98.5 F (36.9 C)  TempSrc:  Oral  Oral  SpO2: 95% 97% 96% 98%  Weight:      Height:        Intake/Output Summary (Last 24 hours) at 09/19/2020 1621 Last data filed at 09/19/2020 1500 Gross per 24 hour  Intake 505.92 ml  Output --  Net 505.92 ml    Filed Weights   09/17/20 1344  Weight: 111.4 kg    Examination:  General.  Well-developed lady, in no acute distress. Pulmonary.  Few right-sided rhonchi, normal respiratory effort. CV.  Regular rate and rhythm, no JVD, rub or murmur. Abdomen.  Soft, nontender, nondistended, BS positive. CNS.  Alert and oriented x3.  No focal neurologic deficit. Extremities.  No edema, no cyanosis, pulses intact and symmetrical. Psychiatry.  Judgment and insight appears normal.   DVT prophylaxis: SCDs Code Status: Full Family Communication: Discussed with patient Disposition Plan:  Status is: Inpatient  Remains inpatient appropriate because:Inpatient level of care appropriate due to severity of illness  Dispo: The patient is from: Home              Anticipated d/c is to: Home              Patient currently is not medically stable to d/c.   Difficult to place patient No              Level of care: Med-Surg  All the records are reviewed and case discussed with Care Management/Social Worker. Management plans discussed with the patient, nursing and they are in agreement.  Consultants:  Pulmonology  Procedures:  Antimicrobials:  Zithromax Levaquin  Data Reviewed: I have personally reviewed following labs and imaging studies  CBC: Recent Labs  Lab 09/17/20 1349 09/17/20 1545 09/18/20 0417 09/18/20 1435 09/19/20 0337  WBC 15.1*  --  10.0  --  9.6  NEUTROABS 10.8*   --   --   --   --   HGB 7.0*  --  6.6* 7.8* 7.8*  HCT 22.1* 19.9* 21.4* 24.5* 24.4*  MCV 108.9*  --  110.3*  --  102.1*  PLT 261  --  268  --  123456    Basic Metabolic Panel: Recent Labs  Lab 09/17/20 1349 09/18/20 0417 09/19/20 0337  NA 138 143 140  K 3.6 4.2 3.8  CL 107 110 106  CO2 '25 27 29  '$ GLUCOSE 110* 135* 145*  BUN '15 12 12  '$ CREATININE 0.81 0.66 0.58  CALCIUM 8.2* 8.9 8.4*    GFR: Estimated Creatinine Clearance: 91.4 mL/min (by C-G formula based on SCr of 0.58 mg/dL). Liver Function Tests: Recent Labs  Lab 09/17/20 1349  AST 26  ALT 17  ALKPHOS 64  BILITOT 1.1  PROT 6.0*  ALBUMIN 3.7    No results for input(s): LIPASE, AMYLASE in the last 168 hours. No results for input(s): AMMONIA in the last 168 hours. Coagulation Profile: Recent Labs  Lab 09/17/20 1349  INR 1.1    Cardiac Enzymes: No results for input(s): CKTOTAL, CKMB, CKMBINDEX, TROPONINI in the last 168 hours. BNP (last 3 results) No results for input(s): PROBNP in the  last 8760 hours. HbA1C: No results for input(s): HGBA1C in the last 72 hours. CBG: No results for input(s): GLUCAP in the last 168 hours. Lipid Profile: No results for input(s): CHOL, HDL, LDLCALC, TRIG, CHOLHDL, LDLDIRECT in the last 72 hours. Thyroid Function Tests: Recent Labs    09/17/20 1545  TSH 0.315*    Anemia Panel: Recent Labs    09/17/20 1545 09/18/20 0417  VITAMINB12 859  --   FERRITIN  --  273  TIBC  --  251  IRON  --  34    Sepsis Labs: Recent Labs  Lab 09/17/20 1419 09/17/20 1545 09/18/20 0417 09/19/20 0337 09/19/20 1244  PROCALCITON  --  0.17 0.19 0.13 0.17  LATICACIDVEN 1.5 1.6  --   --   --      Recent Results (from the past 240 hour(s))  Resp Panel by RT-PCR (Flu A&B, Covid) Nasopharyngeal Swab     Status: None   Collection Time: 09/17/20  2:19 PM   Specimen: Nasopharyngeal Swab; Nasopharyngeal(NP) swabs in vial transport medium  Result Value Ref Range Status   SARS Coronavirus 2  by RT PCR NEGATIVE NEGATIVE Final    Comment: (NOTE) SARS-CoV-2 target nucleic acids are NOT DETECTED.  The SARS-CoV-2 RNA is generally detectable in upper respiratory specimens during the acute phase of infection. The lowest concentration of SARS-CoV-2 viral copies this assay can detect is 138 copies/mL. A negative result does not preclude SARS-Cov-2 infection and should not be used as the sole basis for treatment or other patient management decisions. A negative result may occur with  improper specimen collection/handling, submission of specimen other than nasopharyngeal swab, presence of viral mutation(s) within the areas targeted by this assay, and inadequate number of viral copies(<138 copies/mL). A negative result must be combined with clinical observations, patient history, and epidemiological information. The expected result is Negative.  Fact Sheet for Patients:  EntrepreneurPulse.com.au  Fact Sheet for Healthcare Providers:  IncredibleEmployment.be  This test is no t yet approved or cleared by the [place] FDA and  has been authorized for detection and/or diagnosis of SARS-CoV-2 by FDA under an Emergency Use Authorization (EUA). This EUA will remain  in effect (meaning this test can be used) for the duration of the COVID-19 declaration under Section 564(b)(1) of the Act, 21 U.S.C.section 360bbb-3(b)(1), unless the authorization is terminated  or revoked sooner.       Influenza A by PCR NEGATIVE NEGATIVE Final   Influenza B by PCR NEGATIVE NEGATIVE Final    Comment: (NOTE) The Xpert Xpress SARS-CoV-2/FLU/RSV plus assay is intended as an aid in the diagnosis of influenza from Nasopharyngeal swab specimens and should not be used as a sole basis for treatment. Nasal washings and aspirates are unacceptable for Xpert Xpress SARS-CoV-2/FLU/RSV testing.  Fact Sheet for Patients: EntrepreneurPulse.com.au  Fact  Sheet for Healthcare Providers: IncredibleEmployment.be  This test is not yet approved or cleared by the [place] FDA and has been authorized for detection and/or diagnosis of SARS-CoV-2 by FDA under an Emergency Use Authorization (EUA). This EUA will remain in effect (meaning this test can be used) for the duration of the COVID-19 declaration under Section 564(b)(1) of the Act, 21 U.S.C. section 360bbb-3(b)(1), unless the authorization is terminated or revoked.  Performed at [facility], [address] [place], [place] [address]   Blood Culture (routine x 2)     Status: None (Preliminary result)   Collection Time: 09/17/20  2:19 PM   Specimen: BLOOD  Result Value  Ref Range Status   Specimen Description BLOOD BLOOD LEFT HAND  Final   Special Requests   Final    BOTTLES DRAWN AEROBIC AND ANAEROBIC Blood Culture results may not be optimal due to an inadequate volume of blood received in culture bottles   Culture   Final    NO GROWTH 2 DAYS Performed at [facility], [address], [place], [place] [address]    Report Status PENDING  Incomplete  Blood Culture (routine x 2)     Status: None (Preliminary result)   Collection Time: 09/17/20  2:19 PM   Specimen: BLOOD  Result Value Ref Range Status   Specimen Description BLOOD LEFT ANTECUBITAL  Final   Special Requests   Final    BOTTLES DRAWN AEROBIC AND ANAEROBIC Blood Culture adequate volume   Culture   Final    NO GROWTH 2 DAYS Performed at [facility], [address], [place], [place] [address]    Report Status PENDING  Incomplete  Urine Culture     Status: None   Collection Time: 09/17/20  4:28 PM   Specimen: In/Out Cath Urine  Result Value Ref Range Status   Specimen Description   Final    IN/OUT CATH URINE Performed at [facility], [address], [place], [place] [address]    Special Requests   Final    NONE Performed at [facility], [address] [place], [place] [address]    Culture   Final    NO GROWTH Performed at [facility], [place] [address] [place], [place] [address]    Report Status 09/19/2020 FINAL  Final  Surgical pcr screen     Status: None   Collection Time: 09/19/20 12:47 PM   Specimen: Nasal Mucosa; Nasal Swab  Result Value Ref Range Status   MRSA, PCR NEGATIVE NEGATIVE Final   Staphylococcus aureus NEGATIVE NEGATIVE Final    Comment: (NOTE) The Xpert SA Assay (FDA approved for NASAL specimens in patients 63 years of age and older), is one component of a comprehensive surveillance program. It is not intended to diagnose infection nor to guide or monitor treatment. Performed at [facility], [address], [place], [place] [address]       Radiology Studies: CT Angio Chest Pulmonary Embolism (PE) W or WO Contrast  Result Date: 09/18/2020 CLINICAL DATA:  Rule out pulmonary embolus. Patient presents with hypertension and positive D-dimer. EXAM: CT ANGIOGRAPHY CHEST WITH CONTRAST TECHNIQUE: Multidetector CT imaging of the chest was performed using the standard protocol during bolus administration of intravenous contrast. Multiplanar CT image reconstructions and MIPs were obtained to evaluate the vascular anatomy. CONTRAST:  11m OMNIPAQUE IOHEXOL 350 MG/ML SOLN COMPARISON:  None. FINDINGS: Cardiovascular: Suboptimal pulmonary arterial opacification is noted. Within this limitation no signs of central obstructing pulmonary embolus. No suspicious filling defects identified within the lobar pulmonary arteries. Beyond the level of the lobar pulmonary arteries there is too much motion artifact and suboptimal pulmonary opacification to adequately assess for PE. Heart size appears normal. No pericardial effusion. Aortic atherosclerosis. Mediastinum/Nodes: No enlarged mediastinal, hilar, or axillary lymph nodes. Thyroid gland, trachea, and esophagus demonstrate no significant findings. Lungs/Pleura:  Trace right pleural effusion. Extensive right upper lobe airspace densities and ground-glass attenuation which in the acute setting is concerning for pneumonia. Within the right middle lobe there is a nodular density measuring 0.9 cm, image 56/6. Upper Abdomen: No acute abnormalities. Postoperative changes are noted involving the stomach. Musculoskeletal: There  is degenerative disc disease within the thoracic spine. No acute or suspicious findings identified. Review of the MIP images confirms the above findings. IMPRESSION: 1. Suboptimal pulmonary arterial opacification is noted. Within this limitation no signs of central obstructing pulmonary embolus. Beyond the level of the lobar pulmonary arteries there is too much motion artifact and suboptimal pulmonary opacification to adequately assess for PE. 2. Extensive right upper lobe airspace densities and ground-glass attenuation is concerning for pneumonia. Follow up CT of the chest in 3 months, after appropriate antibiotic therapy, is recommended to ensure complete resolution and to rule out underlying malignancy. 3. 9 mm nodule in the right middle lobe is indeterminate. Attention on post therapy follow-up imaging CT 4. Aortic atherosclerosis. Aortic Atherosclerosis (ICD10-I70.0). Electronically Signed   By: Kerby Moors M.D.   On: 09/18/2020 16:49    Scheduled Meds:  busPIRone  10 mg Oral TID   DULoxetine  60 mg Oral BID   famotidine  40 mg Oral QHS   folic acid  1 mg Intravenous Daily   furosemide  40 mg Intravenous Daily   gabapentin  300 mg Oral BID   ipratropium-albuterol  3 mL Nebulization TID   loratadine  10 mg Oral Daily   methylPREDNISolone (SOLU-MEDROL) injection  40 mg Intravenous Q12H   mometasone-formoterol  2 puff Inhalation BID   montelukast  10 mg Oral QHS   pantoprazole  80 mg Oral Daily   Continuous Infusions:  azithromycin Stopped (09/19/20 1348)   [START ON 09/20/2020] levofloxacin (LEVAQUIN) IV       LOS: 1 day   Time  spent: 40 minutes. More than 50% of the time was spent in counseling/coordination of care  Lorella Nimrod, MD [facility]  If 7PM-7AM, please contact night-coverage [email or web address]  09/19/2020, 4:21 PM   This record has been created using Systems analyst. Errors have been sought and corrected,but may not always be located. Such creation errors do not reflect on the standard of care.

## 2020-09-19 NOTE — Progress Notes (Signed)
Echo report   Report will not transfer appropriately to Epic. Formal result to follow.    Normal left ventricular systolic function. Normal right ventricular function.  Normal RV size. No significant valvular abnormalities Right atrial pressure estimated at 8 mmHg based on IVC size and compressibility.  Andrez Grime, MD

## 2020-09-19 NOTE — Consult Note (Signed)
Pharmacy Antibiotic Note  Bethany Scott is a 65 y.o. female admitted on 09/17/2020 with UTI.  Pharmacy has been consulted for levofloxacin dosing.  Pt met sepsis criteria w/ possible pneumonia vs urine source. UA postive for nitrites, no bacteria or LE, with 0-5 WBC. Urine and blood cultures no growth <24h.   Imaging from 8/25 is showing extensive right upper lobe airspace densities and ground-glass Attenuation that is concerning for pneumonia  Pt has remained afebrile and WBC have improved from 15.1>10. Renal function is stable (Scr=0.66).   Plan: -Will start levofloxacin 737m IV q24h while in-pt (confirmed route w/ MD) -Continue to monitor cultures and deescalate as indicated -Will continue to monitor renal function and adjust dose as clinically indicated   Height: 5' 7"  (170.2 cm) Weight: 111.4 kg (245 lb 9.5 oz) IBW/kg (Calculated) : 61.6  Temp (24hrs), Avg:98.2 F (36.8 C), Min:97.8 F (36.6 C), Max:98.8 F (37.1 C)  Recent Labs  Lab 09/17/20 1349 09/17/20 1419 09/17/20 1545 09/18/20 0417 09/19/20 0337  WBC 15.1*  --   --  10.0 9.6  CREATININE 0.81  --   --  0.66 0.58  LATICACIDVEN  --  1.5 1.6  --   --      Estimated Creatinine Clearance: 91.4 mL/min (by C-G formula based on SCr of 0.58 mg/dL).    Allergies  Allergen Reactions   Cephalexin Anaphylaxis   Cephalosporins Anaphylaxis   Penicillins Anaphylaxis and Other (See Comments)    Has patient had a PCN reaction causing immediate rash, facial/tongue/throat swelling, SOB or lightheadedness with hypotension: Yes Has patient had a PCN reaction causing severe rash involving mucus membranes or skin necrosis: No Has patient had a PCN reaction that required hospitalization Yes Has patient had a PCN reaction occurring within the last 10 years: No If all of the above answers are "NO", then may proceed with Cephalosporin use.     Antimicrobials this admission:  Ongoing Levofloxacin  08/26>>  Completed Levofloxacin  08/24 >> 08/24 (x1) Bactrim 08/25 >> 8/25   Microbiology results: 08/24 BCx: no growth x2 days 08/24 UCx: no growth x2 days  Thank you for allowing pharmacy to be a part of this patient's care.  BNarda Rutherford PharmD Pharmacy Resident  09/19/2020 10:48 AM

## 2020-09-19 NOTE — Plan of Care (Signed)
  Problem: Clinical Measurements: Goal: Will remain free from infection Outcome: Progressing  Continues po abt treatment, fluids encouraged, no adverse reactions noted

## 2020-09-19 NOTE — Progress Notes (Signed)
*  PRELIMINARY RESULTS* Echocardiogram 2D Echocardiogram has been performed.  Sherrie Sport 09/19/2020, 1:52 PM

## 2020-09-20 DIAGNOSIS — J189 Pneumonia, unspecified organism: Secondary | ICD-10-CM

## 2020-09-20 MED ORDER — POLYETHYLENE GLYCOL 3350 17 G PO PACK
17.0000 g | PACK | Freq: Every day | ORAL | Status: DC
  Administered 2020-09-20 – 2020-09-21 (×2): 17 g via ORAL
  Filled 2020-09-20 (×2): qty 1

## 2020-09-20 MED ORDER — PREDNISONE 50 MG PO TABS
50.0000 mg | ORAL_TABLET | Freq: Every day | ORAL | Status: DC
  Administered 2020-09-21: 50 mg via ORAL
  Filled 2020-09-20: qty 1

## 2020-09-20 MED ORDER — DM-GUAIFENESIN ER 30-600 MG PO TB12
1.0000 | ORAL_TABLET | Freq: Two times a day (BID) | ORAL | Status: DC
  Administered 2020-09-20 – 2020-09-21 (×3): 1 via ORAL
  Filled 2020-09-20 (×3): qty 1

## 2020-09-20 NOTE — Progress Notes (Signed)
PROGRESS NOTE    Bethany Scott  [id number] DOB: 1955/09/15 DOA: 09/17/2020 PCP: [name], MD   Brief Narrative: Taken from H&P Bethany Scott is a 65 y.o. female with medical history significant for non-insulin-dependent diabetes mellitus, history of hypertension, history of hyperlipidemia, depression, anxiety, GERD,chronic anemia, frequent cystitis, history of gastric bypass, presents the emergency department for chief concerns of shortness of breath, productive cough.  She does not use oxygen at baseline.  History of asthma. ROS was also positive for some dysuria, recently completed a course of Macrobid. There was also some concern of bilateral lower extremity edema-venous Doppler was negative. Also had an history of COVID-19 infection about a year ago.  On arrival she was hemodynamically stable.  Found to have leukocytosis at 15.1, hemoglobin of 7, chest x-ray concerning for bilateral opacities/pulmonary vascular congestion, borderline elevation of procalcitonin at 0.17, BNP within normal limit.  Troponin negative.  UA positive for leukocytes and nitrites, blood and urine cultures negative.  CTA was negative for PE but did show extensive right-sided infiltrate and groundglass opacities, concerning for pneumonia.  Pulmonary was also consulted-strep pneumo, MRSA PCR and respiratory viral panel negative.  Markedly elevated CRP and borderline elevation of procalcitonin, Legionella pending, Fungitell pending Echocardiogram within normal limits  Subjective: Patient was seen while she was eating breakfast this morning.  She thinks that she is improving with some improvement in cough, although it does come in bouts, mostly dry, no phlegm.  Assessment & Plan:   Principal Problem:   Sepsis ([place]) Active Problems:   Depression with anxiety   Essential hypertension, benign   Pure hypercholesterolemia   DDD (degenerative disc disease), lumbar   Degenerative joint disease (DJD) of hip    S/P laparoscopic sleeve gastrectomy   Status post total hip replacement, right   Status post total hip replacement, left   Pelvic pain in female   Acute respiratory failure with hypoxia (HCC)  Acute hypoxic respiratory failure.  Not on home oxygen.  Chest x-ray with some bilateral infiltrates/pulmonary edema.  BNP within normal limits and troponin negative.  No prior diagnosis of any heart failure and she appears euvolemic.  Echocardiogram without any acute abnormality.  Mildly elevated D-dimer, lower extremity venous Doppler were negative for DVT.  No prior history of venous clotting.  History of COVID-19 infection about a year ago.  Procalcitonin 0.17>>0.19>>0.17.  CRP at 11.  Respiratory viral panel, strep pneumo antigen and MRSA PCR negative. -CTA with no PE but shows extensive right-sided infiltrate and groundglass opacities.  No -Switch Solu-Medrol with prednisone 50 mg daily from tomorrow -DuoNeb every 4 hourly -Continue Levaquin -Follow-up Fungitell and Legionella labs -Albuterol nebulizer as needed. -Continue with Zithromax-course today -Continue with supplemental oxygen-wean as tolerated.  Currently saturating in mid 90s on 1.5 L. -Continue with supportive care. -Ambulate with pulse ox  Concern of UTI.  UA with nitrites and leukocytes.  Urine cultures negative.  Blood cultures negative so far.  Patient is allergic to penicillin and cephalosporin, documented as anaphylaxis. -Bactrim was discontinued.  Macrocytic anemia.  No history of frequent alcohol use.  History of gastric bypass and iron deficiency in the past.  Per patient she has an history of anemia in the past but I could not find any documentation.  Colonoscopy with some internal hemorrhoids and EGD without significant abnormality in 2021.  Denies any obvious bleeding.  Hemoglobin at 7.8 this morning.  RBC folate low. B12 and iron studies within normal limit.-TSH with borderline decreased at  0.315 -Check FOBT-still  pending-no BM yet -Received 2 units of PRBC -Start her on folic acid replacement, IV while in the hospital and p.o. on discharge. -Might need outpatient hematology evaluation.  History of depression and anxiety.  No acute concern. -Continue home dose of buspirone and duloxetine  Chronic back pain.  Patient also saw his a neurologist for concern of hyperreflexia and clonus. Mild little exacerbation with UTI.  No incontinence, no tingling or numbness or focal weakness today. -Continue home dose of gabapentin. -Continue home Robaxin as needed -Continue home Norco as needed   Objective: Vitals:   09/20/20 0716 09/20/20 0729 09/20/20 0854 09/20/20 1136  BP:  120/60  (!) 144/78  Pulse:  (!) 114 (!) 106 (!) 102  Resp:  20  19  Temp:  97.8 F (36.6 C)  98.3 F (36.8 C)  TempSrc:      SpO2: 97% 95%  95%  Weight:      Height:        Intake/Output Summary (Last 24 hours) at 09/20/2020 1257 Last data filed at 09/20/2020 1024 Gross per 24 hour  Intake 830.04 ml  Output --  Net 830.04 ml    Filed Weights   09/17/20 1344  Weight: 111.4 kg    Examination:  General.  Well-developed lady, in no acute distress. Pulmonary.  Lungs clear bilaterally, normal respiratory effort. CV.  Regular rate and rhythm, no JVD, rub or murmur. Abdomen.  Soft, nontender, nondistended, BS positive. CNS.  Alert and oriented x3.  No focal neurologic deficit. Extremities.  No edema, no cyanosis, pulses intact and symmetrical. Psychiatry.  Judgment and insight appears normal.   DVT prophylaxis: SCDs Code Status: Full Family Communication: Discussed with patient Disposition Plan:  Status is: Inpatient  Remains inpatient appropriate because:Inpatient level of care appropriate due to severity of illness  Dispo: The patient is from: Home              Anticipated d/c is to: Home              Patient currently is not medically stable to d/c.   Difficult to place patient No              Level of care:  Med-Surg  All the records are reviewed and case discussed with Care Management/Social Worker. Management plans discussed with the patient, nursing and they are in agreement.  Consultants:  Pulmonology  Procedures:  Antimicrobials:  Zithromax Levaquin  Data Reviewed: I have personally reviewed following labs and imaging studies  CBC: Recent Labs  Lab 09/17/20 1349 09/17/20 1545 09/18/20 0417 09/18/20 1435 09/19/20 0337  WBC 15.1*  --  10.0  --  9.6  NEUTROABS 10.8*  --   --   --   --   HGB 7.0*  --  6.6* 7.8* 7.8*  HCT 22.1* 19.9* 21.4* 24.5* 24.4*  MCV 108.9*  --  110.3*  --  102.1*  PLT 261  --  268  --  123456    Basic Metabolic Panel: Recent Labs  Lab 09/17/20 1349 09/18/20 0417 09/19/20 0337  NA 138 143 140  K 3.6 4.2 3.8  CL 107 110 106  CO2 '25 27 29  '$ GLUCOSE 110* 135* 145*  BUN '15 12 12  '$ CREATININE 0.81 0.66 0.58  CALCIUM 8.2* 8.9 8.4*    GFR: Estimated Creatinine Clearance: 91.4 mL/min (by C-G formula based on SCr of 0.58 mg/dL). Liver Function Tests: Recent Labs  Lab 09/17/20 1349  AST 26  ALT 17  ALKPHOS 64  BILITOT 1.1  PROT 6.0*  ALBUMIN 3.7    No results for input(s): LIPASE, AMYLASE in the last 168 hours. No results for input(s): AMMONIA in the last 168 hours. Coagulation Profile: Recent Labs  Lab 09/17/20 1349  INR 1.1    Cardiac Enzymes: No results for input(s): CKTOTAL, CKMB, CKMBINDEX, TROPONINI in the last 168 hours. BNP (last 3 results) No results for input(s): PROBNP in the last 8760 hours. HbA1C: No results for input(s): HGBA1C in the last 72 hours. CBG: No results for input(s): GLUCAP in the last 168 hours. Lipid Profile: No results for input(s): CHOL, HDL, LDLCALC, TRIG, CHOLHDL, LDLDIRECT in the last 72 hours. Thyroid Function Tests: Recent Labs    09/17/20 1545  TSH 0.315*    Anemia Panel: Recent Labs    09/17/20 1545 09/18/20 0417  VITAMINB12 859  --   FERRITIN  --  273  TIBC  --  251  IRON  --  34     Sepsis Labs: Recent Labs  Lab 09/17/20 1419 09/17/20 1545 09/18/20 0417 09/19/20 0337 09/19/20 1244  PROCALCITON  --  0.17 0.19 0.13 0.17  LATICACIDVEN 1.5 1.6  --   --   --      Recent Results (from the past 240 hour(s))  Resp Panel by RT-PCR (Flu A&B, Covid) Nasopharyngeal Swab     Status: None   Collection Time: 09/17/20  2:19 PM   Specimen: Nasopharyngeal Swab; Nasopharyngeal(NP) swabs in vial transport medium  Result Value Ref Range Status   SARS Coronavirus 2 by RT PCR NEGATIVE NEGATIVE Final    Comment: (NOTE) SARS-CoV-2 target nucleic acids are NOT DETECTED.  The SARS-CoV-2 RNA is generally detectable in upper respiratory specimens during the acute phase of infection. The lowest concentration of SARS-CoV-2 viral copies this assay can detect is 138 copies/mL. A negative result does not preclude SARS-Cov-2 infection and should not be used as the sole basis for treatment or other patient management decisions. A negative result may occur with  improper specimen collection/handling, submission of specimen other than nasopharyngeal swab, presence of viral mutation(s) within the areas targeted by this assay, and inadequate number of viral copies(<138 copies/mL). A negative result must be combined with clinical observations, patient history, and epidemiological information. The expected result is Negative.  Fact Sheet for Patients:  EntrepreneurPulse.com.au  Fact Sheet for Healthcare Providers:  IncredibleEmployment.be  This test is no t yet approved or cleared by the [place] FDA and  has been authorized for detection and/or diagnosis of SARS-CoV-2 by FDA under an Emergency Use Authorization (EUA). This EUA will remain  in effect (meaning this test can be used) for the duration of the COVID-19 declaration under Section 564(b)(1) of the Act, 21 U.S.C.section 360bbb-3(b)(1), unless the authorization is terminated  or revoked  sooner.       Influenza A by PCR NEGATIVE NEGATIVE Final   Influenza B by PCR NEGATIVE NEGATIVE Final    Comment: (NOTE) The Xpert Xpress SARS-CoV-2/FLU/RSV plus assay is intended as an aid in the diagnosis of influenza from Nasopharyngeal swab specimens and should not be used as a sole basis for treatment. Nasal washings and aspirates are unacceptable for Xpert Xpress SARS-CoV-2/FLU/RSV testing.  Fact Sheet for Patients: EntrepreneurPulse.com.au  Fact Sheet for Healthcare Providers: IncredibleEmployment.be  This test is not yet approved or cleared by the [place] FDA and has been authorized for detection and/or diagnosis of SARS-CoV-2 by FDA under an Emergency Use Authorization (EUA). This  EUA will remain in effect (meaning this test can be used) for the duration of the COVID-19 declaration under Section 564(b)(1) of the Act, 21 U.S.C. section 360bbb-3(b)(1), unless the authorization is terminated or revoked.  Performed at [facility], [address] [place], [place] [address]   Blood Culture (routine x 2)     Status: None (Preliminary result)   Collection Time: 09/17/20  2:19 PM   Specimen: BLOOD  Result Value Ref Range Status   Specimen Description BLOOD BLOOD LEFT HAND  Final   Special Requests   Final    BOTTLES DRAWN AEROBIC AND ANAEROBIC Blood Culture results may not be optimal due to an inadequate volume of blood received in culture bottles   Culture   Final    NO GROWTH 3 DAYS Performed at [facility], [address], [place], [place] [address]    Report Status PENDING  Incomplete  Blood Culture (routine x 2)     Status: None (Preliminary result)   Collection Time: 09/17/20  2:19 PM   Specimen: BLOOD  Result Value Ref Range Status   Specimen Description BLOOD LEFT ANTECUBITAL  Final   Special Requests   Final    BOTTLES DRAWN AEROBIC AND ANAEROBIC Blood Culture adequate volume   Culture   Final     NO GROWTH 3 DAYS Performed at [facility], [address], [place], [place] [address]    Report Status PENDING  Incomplete  Urine Culture     Status: None   Collection Time: 09/17/20  4:28 PM   Specimen: In/Out Cath Urine  Result Value Ref Range Status   Specimen Description   Final    IN/OUT CATH URINE Performed at [facility], [address] [place], [place] [address]    Special Requests   Final    NONE Performed at [facility], [address] [place], [place] [address]    Culture   Final    NO GROWTH Performed at [facility], [place] [address], [place], [place] [address]    Report Status 09/19/2020 FINAL  Final  Surgical pcr screen     Status: None   Collection Time: 09/19/20 12:47 PM   Specimen: Nasal Mucosa; Nasal Swab  Result Value Ref Range Status   MRSA, PCR NEGATIVE NEGATIVE Final   Staphylococcus aureus NEGATIVE NEGATIVE Final    Comment: (NOTE) The Xpert SA Assay (FDA approved for NASAL specimens in patients 89 years of age and older), is one component of a comprehensive surveillance program. It is not intended to diagnose infection nor to guide or monitor treatment. Performed at [facility], [place],  [address]   Respiratory (~20 pathogens) panel by PCR     Status: None   Collection Time: 09/19/20 12:47 PM   Specimen: Nasopharyngeal Swab; Respiratory  Result Value Ref Range Status   Adenovirus NOT DETECTED NOT DETECTED Final   Coronavirus 229E NOT DETECTED NOT DETECTED Final    Comment: (NOTE) The Coronavirus on the Respiratory Panel, DOES NOT test for the novel  Coronavirus (2019 nCoV)    Coronavirus HKU1 NOT DETECTED NOT DETECTED Final   Coronavirus NL63 NOT DETECTED NOT DETECTED Final   Coronavirus OC43 NOT DETECTED NOT DETECTED Final   Metapneumovirus NOT DETECTED NOT DETECTED Final   Rhinovirus / Enterovirus NOT DETECTED NOT DETECTED Final   Influenza A NOT DETECTED NOT  DETECTED Final   Influenza B NOT DETECTED NOT DETECTED Final  Parainfluenza Virus 1 NOT DETECTED NOT DETECTED Final   Parainfluenza Virus 2 NOT DETECTED NOT DETECTED Final   Parainfluenza Virus 3 NOT DETECTED NOT DETECTED Final   Parainfluenza Virus 4 NOT DETECTED NOT DETECTED Final   Respiratory Syncytial Virus NOT DETECTED NOT DETECTED Final   Bordetella pertussis NOT DETECTED NOT DETECTED Final   Bordetella Parapertussis NOT DETECTED NOT DETECTED Final   Chlamydophila pneumoniae NOT DETECTED NOT DETECTED Final   Mycoplasma pneumoniae NOT DETECTED NOT DETECTED Final    Comment: Performed at [place] [facility], [place] [address] [place], [place] [address]      Radiology Studies: CT Angio Chest Pulmonary Embolism (PE) W or WO Contrast  Result Date: 09/18/2020 CLINICAL DATA:  Rule out pulmonary embolus. Patient presents with hypertension and positive D-dimer. EXAM: CT ANGIOGRAPHY CHEST WITH CONTRAST TECHNIQUE: Multidetector CT imaging of the chest was performed using the standard protocol during bolus administration of intravenous contrast. Multiplanar CT image reconstructions and MIPs were obtained to evaluate the vascular anatomy. CONTRAST:  136m OMNIPAQUE IOHEXOL 350 MG/ML SOLN COMPARISON:  None. FINDINGS: Cardiovascular: Suboptimal pulmonary arterial opacification is noted. Within this limitation no signs of central obstructing pulmonary embolus. No suspicious filling defects identified within the lobar pulmonary arteries. Beyond the level of the lobar pulmonary arteries there is too much motion artifact and suboptimal pulmonary opacification to adequately assess for PE. Heart size appears normal. No pericardial effusion. Aortic atherosclerosis. Mediastinum/Nodes: No enlarged mediastinal, hilar, or axillary lymph nodes. Thyroid gland, trachea, and esophagus demonstrate no significant findings. Lungs/Pleura: Trace right pleural effusion. Extensive right upper lobe airspace densities and  ground-glass attenuation which in the acute setting is concerning for pneumonia. Within the right middle lobe there is a nodular density measuring 0.9 cm, image 56/6. Upper Abdomen: No acute abnormalities. Postoperative changes are noted involving the stomach. Musculoskeletal: There is degenerative disc disease within the thoracic spine. No acute or suspicious findings identified. Review of the MIP images confirms the above findings. IMPRESSION: 1. Suboptimal pulmonary arterial opacification is noted. Within this limitation no signs of central obstructing pulmonary embolus. Beyond the level of the lobar pulmonary arteries there is too much motion artifact and suboptimal pulmonary opacification to adequately assess for PE. 2. Extensive right upper lobe airspace densities and ground-glass attenuation is concerning for pneumonia. Follow up CT of the chest in 3 months, after appropriate antibiotic therapy, is recommended to ensure complete resolution and to rule out underlying malignancy. 3. 9 mm nodule in the right middle lobe is indeterminate. Attention on post therapy follow-up imaging CT 4. Aortic atherosclerosis. Aortic Atherosclerosis (ICD10-I70.0). Electronically Signed   By: TKerby MoorsM.D.   On: 09/18/2020 16:49    Scheduled Meds:  busPIRone  10 mg Oral TID   dextromethorphan-guaiFENesin  1 tablet Oral BID   DULoxetine  60 mg Oral BID   famotidine  40 mg Oral QHS   folic acid  1 mg Intravenous Daily   furosemide  40 mg Intravenous Daily   gabapentin  300 mg Oral BID   ipratropium-albuterol  3 mL Nebulization TID   loratadine  10 mg Oral Daily   mometasone-formoterol  2 puff Inhalation BID   montelukast  10 mg Oral QHS   pantoprazole  80 mg Oral Daily   polyethylene glycol  17 g Oral Daily   [START ON 09/21/2020] predniSONE  50 mg Oral Q breakfast   Continuous Infusions:  azithromycin 500 mg (09/20/20 1200)   levofloxacin (LEVAQUIN) IV 750 mg (09/20/20 0026)  LOS: 2 days   Time  spent: 36 minutes. More than 50% of the time was spent in counseling/coordination of care  Lorella Nimrod, MD [facility]  If 7PM-7AM, please contact night-coverage [email or web address]  09/20/2020, 12:57 PM   This record has been created using Systems analyst. Errors have been sought and corrected,but may not always be located. Such creation errors do not reflect on the standard of care.

## 2020-09-20 NOTE — Progress Notes (Signed)
Pulse rechecked, auscultated apical pulse - 105    09/20/20 0729  Assess: MEWS Score  Temp 97.8 F (36.6 C)  BP 120/60  Pulse Rate (!) 114  Resp 20  Level of Consciousness Alert  SpO2 95 %  O2 Device Nasal Cannula  O2 Flow Rate (L/min) 2 L/min  Assess: MEWS Score  MEWS Temp 0  MEWS Systolic 0  MEWS Pulse 2  MEWS RR 0  MEWS LOC 0  MEWS Score 2  MEWS Score Color Yellow  Assess: if the MEWS score is Yellow or Red  Were vital signs taken at a resting state? Yes  Focused Assessment No change from prior assessment  Does the patient meet 2 or more of the SIRS criteria? No  MEWS guidelines implemented *See Row Information* No, vital signs rechecked  Treat  Pain Scale 0-10  Pain Score 2  Assess: SIRS CRITERIA  SIRS Temperature  0  SIRS Pulse 1  SIRS Respirations  0  SIRS WBC 0  SIRS Score Sum  1

## 2020-09-21 ENCOUNTER — Inpatient Hospital Stay: Payer: 59

## 2020-09-21 LAB — CBC
HCT: 26.2 % — ABNORMAL LOW (ref 36.0–46.0)
Hemoglobin: 8.2 g/dL — ABNORMAL LOW (ref 12.0–15.0)
MCH: 33.1 pg (ref 26.0–34.0)
MCHC: 31.3 g/dL (ref 30.0–36.0)
MCV: 105.6 fL — ABNORMAL HIGH (ref 80.0–100.0)
Platelets: 275 10*3/uL (ref 150–400)
RBC: 2.48 MIL/uL — ABNORMAL LOW (ref 3.87–5.11)
RDW: 17.1 % — ABNORMAL HIGH (ref 11.5–15.5)
WBC: 7.4 10*3/uL (ref 4.0–10.5)
nRBC: 0.5 % — ABNORMAL HIGH (ref 0.0–0.2)

## 2020-09-21 LAB — HAPTOGLOBIN: Haptoglobin: <10 mg/dL — ABNORMAL LOW (ref 37–355)

## 2020-09-21 MED ORDER — LEVOFLOXACIN 750 MG PO TABS: 750.0000 mg | ORAL_TABLET | Freq: Every day | ORAL | 0 refills | Status: AC

## 2020-09-21 MED ORDER — FE FUMARATE-B12-VIT C-FA-IFC PO CAPS
1.0000 | ORAL_CAPSULE | Freq: Two times a day (BID) | ORAL | Status: DC
  Filled 2020-09-21: qty 1

## 2020-09-21 MED ORDER — FE FUMARATE-B12-VIT C-FA-IFC PO CAPS: 1.0000 | ORAL_CAPSULE | Freq: Two times a day (BID) | ORAL | 1 refills | Status: DC

## 2020-09-21 MED ORDER — PREDNISONE 50 MG PO TABS: ORAL_TABLET | ORAL | 0 refills | Status: DC

## 2020-09-21 MED ORDER — DM-GUAIFENESIN ER 30-600 MG PO TB12: 1.0000 | ORAL_TABLET | Freq: Two times a day (BID) | ORAL | 1 refills | Status: DC

## 2020-09-21 MED ORDER — HYDROCOD POLST-CPM POLST ER 10-8 MG/5ML PO SUER: 5.0000 mL | Freq: Every evening | ORAL | 0 refills | Status: DC | PRN

## 2020-09-21 NOTE — Progress Notes (Signed)
Pt refused wheelchair for discharge, ambulating with husband

## 2020-09-21 NOTE — Progress Notes (Signed)
Pt being discharged home, discharge instructions reviewed with pt and husband, states understanding, no complaints

## 2020-09-21 NOTE — Discharge Summary (Signed)
Physician Discharge Summary  Bethany Scott CWC:[id number] DOB: April 12, 1955 DOA: 09/17/2020  PCP: [name], MD  Admit date: 09/17/2020 Discharge date: 09/21/2020  Admitted From: Home Disposition: Home  Recommendations for Outpatient Follow-up:  Follow up with PCP in 1-2 weeks Follow-up with pulmonology Please obtain BMP/CBC in one week Please follow up on the following pending results: Fungitell and Legionella results  Home Health: No Equipment/Devices: None Discharge Condition: Stable CODE STATUS: Full Diet recommendation: Heart Healthy / Carb Modified   Brief/Interim Summary: Bethany Scott is a 65 y.o. female with medical history significant for non-insulin-dependent diabetes mellitus, history of hypertension, history of hyperlipidemia, depression, anxiety, GERD,chronic anemia, frequent cystitis, history of gastric bypass, presents the emergency department for chief concerns of shortness of breath, productive cough.  She does not use oxygen at baseline.  History of asthma. ROS was also positive for some dysuria, recently completed a course of Macrobid. There was also some concern of bilateral lower extremity edema-venous Doppler was negative. Also had an history of COVID-19 infection about a year ago.   On arrival she was hemodynamically stable.  Found to have leukocytosis at 15.1, hemoglobin of 7, chest x-ray concerning for bilateral opacities/pulmonary vascular congestion, borderline elevation of procalcitonin at 0.17, BNP within normal limit.  Troponin negative.  UA positive for leukocytes and nitrites, blood and urine cultures negative.   CTA was negative for PE but did show extensive right-sided infiltrate and groundglass opacities, concerning for pneumonia.  Pulmonary was also consulted-strep pneumo, MRSA PCR and respiratory viral panel negative.  Markedly elevated CRP and borderline elevation of procalcitonin, Legionella pending, Fungitell pending Echocardiogram within  normal limits.  There was also concern of asthma exacerbation.  Patient received 3 days of Zithromax.  Steroid and also Levaquin for concern of Due to her allergies. She responded very well, able to wean off from oxygen. She was discharged on 3 more days of prednisone and Levaquin. She was also instructed to follow-up with pulmonology as an outpatient for further recommendations. Repeat chest x-ray on the day of discharge was with resolution of prior findings.  There was some concern of UTI but urine and blood cultures remain negative.  She did received a dose of Bactrim which was discontinued after negative cultures.  Patient was also found to have macrocytic anemia.  Per patient she is struggling with anemia for some time but has not seen a hematologist yet.  She received 2 unit of PRBC and hemoglobin was 8.2 on the day of discharge.  She had hemoglobin of 6.6 before receiving blood transfusion.  Denies any obvious bleeding.  Had a colonoscopy with some internal hemorrhoids and EGD without significant abnormality in 2021.  Anemia panel with significant folic acid deficiency along with some iron deficiency.  She received IV folic acid while in the hospital and discharged on supplement.  She was also advised to follow-up with hematology for further work-up and recommendations.  She will continue the rest of her home medications and follow-up with her providers.  Discharge Diagnoses:  Principal Problem:   Sepsis ([place]) Active Problems:   Depression with anxiety   Essential hypertension, benign   Pure hypercholesterolemia   DDD (degenerative disc disease), lumbar   Degenerative joint disease (DJD) of hip   S/P laparoscopic sleeve gastrectomy   Status post total hip replacement, right   Status post total hip replacement, left   Pelvic pain in female   Acute respiratory failure with hypoxia ([place])   Community acquired pneumonia of left  lung   Discharge Instructions  Discharge Instructions      Diet - low sodium heart healthy   Complete by: As directed    Discharge instructions   Complete by: As directed    It was pleasure taking care of you. You will take prednisone and antibiotics for 3 more days. Please follow-up with pulmonologist within 1 to 2 weeks. Please review your medications with your primary care provider as there was a lot of duplicates.   Increase activity slowly   Complete by: As directed       Allergies as of 09/21/2020       Reactions   Cephalexin Anaphylaxis   Cephalosporins Anaphylaxis   Penicillins Anaphylaxis, Other (See Comments)   Has patient had a PCN reaction causing immediate rash, facial/tongue/throat swelling, SOB or lightheadedness with hypotension: Yes Has patient had a PCN reaction causing severe rash involving mucus membranes or skin necrosis: No Has patient had a PCN reaction that required hospitalization Yes Has patient had a PCN reaction occurring within the last 10 years: No If all of the above answers are "NO", then may proceed with Cephalosporin use.        Medication List     STOP taking these medications    azithromycin 250 MG tablet Commonly known as: Zithromax Z-Pak   benzonatate 100 MG capsule Commonly known as: Biomedical engineer COVID-19 Home Test Kit Generic drug: COVID-19 At Home Antigen Test   doxycycline 100 MG capsule Commonly known as: MONODOX   Flovent Diskus 100 MCG/BLIST Aepb Generic drug: Fluticasone Propionate (Inhal)   imiquimod 5 % cream Commonly known as: ALDARA   ivermectin 3 MG Tabs tablet Commonly known as: STROMECTOL   ketoconazole 2 % shampoo Commonly known as: NIZORAL   mupirocin ointment 2 % Commonly known as: BACTROBAN   nitrofurantoin (macrocrystal-monohydrate) 100 MG capsule Commonly known as: MACROBID   Olopatadine HCl 0.2 % Soln   pantoprazole 40 MG tablet Commonly known as: PROTONIX   phenazopyridine 200 MG tablet Commonly known as: PYRIDIUM    promethazine-dextromethorphan 6.25-15 MG/5ML syrup Commonly known as: PROMETHAZINE-DM       TAKE these medications    albuterol 108 (90 Base) MCG/ACT inhaler Commonly known as: VENTOLIN HFA Inhale 1-2 puffs into the lungs every 6 (six) hours as needed for wheezing or shortness of breath.   azelastine 0.05 % ophthalmic solution Commonly known as: OPTIVAR Place 1 drop into both eyes daily.   Azelastine-Fluticasone 137-50 MCG/ACT Susp Place 2 sprays into the nose 2 (two) times a day.   busPIRone 10 MG tablet Commonly known as: BUSPAR Take 1 tablet (10 mg total) by mouth 3 (three) times daily   chlorpheniramine-HYDROcodone 10-8 MG/5ML Suer Commonly known as: Tussionex Pennkinetic ER Take 5 mLs by mouth at bedtime as needed for cough.   dexlansoprazole 60 MG capsule Commonly known as: DEXILANT Take 1 capsule (60 mg total) by mouth once daily What changed: Another medication with the same name was removed. Continue taking this medication, and follow the directions you see here.   dextromethorphan-guaiFENesin 30-600 MG 12hr tablet Commonly known as: MUCINEX DM Take 1 tablet by mouth 2 (two) times daily.   DULoxetine 60 MG capsule Commonly known as: CYMBALTA Take 1 capsule (60 mg total) by mouth 2 (two) times daily. What changed: Another medication with the same name was removed. Continue taking this medication, and follow the directions you see here.   famotidine 40 MG tablet Commonly known as: PEPCID Take 1 tablet (  40 mg total) by mouth nightly What changed: Another medication with the same name was removed. Continue taking this medication, and follow the directions you see here.   ferrous [id number] C-folic acid capsule Commonly known as: TRINSICON / FOLTRIN Take 1 capsule by mouth 2 (two) times daily after a meal.   fluconazole 150 MG tablet Commonly known as: DIFLUCAN Take one tablet as needed for yeast infection due to antibiotic use. Repeat in one week  if needed.   gabapentin 300 MG capsule Commonly known as: NEURONTIN TAKE 1 TO 3 CAPSULES BY MOUTH THREE TIMES DAILY   HYDROcodone-acetaminophen 10-325 MG tablet Commonly known as: NORCO Take 1 tablet by mouth every 6 (six) hours as needed for moderate pain.   hydrocortisone 2.5 % cream TWICE DAILY TO AFFECTED AREA FOR UP TO 2 WEEKS AS NEEDED FOR RASH   levocetirizine 5 MG tablet Commonly known as: XYZAL TAKE 1 TABLET BY MOUTH EVERY EVENING   levofloxacin 750 MG tablet Commonly known as: Levaquin Take 1 tablet (750 mg total) by mouth daily for 3 days. What changed:  medication strength how much to take   methocarbamol 750 MG tablet Commonly known as: ROBAXIN Take 750 mg by mouth 3 (three) times daily as needed for muscle spasms.   montelukast 10 MG tablet Commonly known as: SINGULAIR Take 1 tablet (10 mg total) by mouth daily. What changed: Another medication with the same name was removed. Continue taking this medication, and follow the directions you see here.   nystatin cream Commonly known as: MYCOSTATIN Apply two to three times per day as needed   predniSONE 50 MG tablet Commonly known as: DELTASONE Take 1 tablet daily for next 3 days. Start taking on: September 22, 2020   promethazine 25 MG tablet Commonly known as: PHENERGAN TAKE 1 TABLET BY MOUTH EVERY 8 HOURS AS NEEDED FOR NAUSEA OR VOMITING   Symbicort 160-4.5 MCG/ACT inhaler Generic drug: budesonide-formoterol INHALE 2 PUFFS BY MOUTH 2 TIMES DAILY   triamcinolone 0.1 % paste Commonly known as: KENALOG Dry ulcer with tissue and then apply paste to affected area twice a day until no longer tender.        Follow-up Information     [name], MD. Schedule an appointment as soon as possible for a visit in 1 week(s).   Specialty: Family Medicine Contact information: [place] [address] [phone]         Ottie Glazier, MD. Schedule an appointment as soon as  possible for a visit in 1 week(s).   Specialty: Pulmonary Disease Contact information: [place] [address] [phone]                Allergies  Allergen Reactions   Cephalexin Anaphylaxis   Cephalosporins Anaphylaxis   Penicillins Anaphylaxis and Other (See Comments)    Has patient had a PCN reaction causing immediate rash, facial/tongue/throat swelling, SOB or lightheadedness with hypotension: Yes Has patient had a PCN reaction causing severe rash involving mucus membranes or skin necrosis: No Has patient had a PCN reaction that required hospitalization Yes Has patient had a PCN reaction occurring within the last 10 years: No If all of the above answers are "NO", then may proceed with Cephalosporin use.     Consultations: Pulmonology  Procedures/Studies: DG Chest 2 View  Result Date: 09/21/2020 CLINICAL DATA:  Shortness of breath. EXAM: CHEST - 2 VIEW COMPARISON:  September 17, 2020. FINDINGS: The heart size and mediastinal contours are  within normal limits. Both lungs are clear. The visualized skeletal structures are unremarkable. IMPRESSION: No active cardiopulmonary disease. Electronically Signed   By: Marijo Conception M.D.   On: 09/21/2020 10:14   CT Angio Chest Pulmonary Embolism (PE) W or WO Contrast  Result Date: 09/18/2020 CLINICAL DATA:  Rule out pulmonary embolus. Patient presents with hypertension and positive D-dimer. EXAM: CT ANGIOGRAPHY CHEST WITH CONTRAST TECHNIQUE: Multidetector CT imaging of the chest was performed using the standard protocol during bolus administration of intravenous contrast. Multiplanar CT image reconstructions and MIPs were obtained to evaluate the vascular anatomy. CONTRAST:  148m OMNIPAQUE IOHEXOL 350 MG/ML SOLN COMPARISON:  None. FINDINGS: Cardiovascular: Suboptimal pulmonary arterial opacification is noted. Within this limitation no signs of central obstructing pulmonary embolus. No suspicious filling defects  identified within the lobar pulmonary arteries. Beyond the level of the lobar pulmonary arteries there is too much motion artifact and suboptimal pulmonary opacification to adequately assess for PE. Heart size appears normal. No pericardial effusion. Aortic atherosclerosis. Mediastinum/Nodes: No enlarged mediastinal, hilar, or axillary lymph nodes. Thyroid gland, trachea, and esophagus demonstrate no significant findings. Lungs/Pleura: Trace right pleural effusion. Extensive right upper lobe airspace densities and ground-glass attenuation which in the acute setting is concerning for pneumonia. Within the right middle lobe there is a nodular density measuring 0.9 cm, image 56/6. Upper Abdomen: No acute abnormalities. Postoperative changes are noted involving the stomach. Musculoskeletal: There is degenerative disc disease within the thoracic spine. No acute or suspicious findings identified. Review of the MIP images confirms the above findings. IMPRESSION: 1. Suboptimal pulmonary arterial opacification is noted. Within this limitation no signs of central obstructing pulmonary embolus. Beyond the level of the lobar pulmonary arteries there is too much motion artifact and suboptimal pulmonary opacification to adequately assess for PE. 2. Extensive right upper lobe airspace densities and ground-glass attenuation is concerning for pneumonia. Follow up CT of the chest in 3 months, after appropriate antibiotic therapy, is recommended to ensure complete resolution and to rule out underlying malignancy. 3. 9 mm nodule in the right middle lobe is indeterminate. Attention on post therapy follow-up imaging CT 4. Aortic atherosclerosis. Aortic Atherosclerosis (ICD10-I70.0). Electronically Signed   By: TKerby MoorsM.D.   On: 09/18/2020 16:49   UKoreaVenous Img Lower Bilateral  Result Date: 09/17/2020 CLINICAL DATA:  Leg swelling x1 week. EXAM: BILATERAL LOWER EXTREMITY VENOUS DOPPLER ULTRASOUND TECHNIQUE: Gray-scale  sonography with compression, as well as color and duplex ultrasound, were performed to evaluate the deep venous system(s) from the level of the common femoral vein through the popliteal and proximal calf veins. COMPARISON:  None. FINDINGS: VENOUS Normal compressibility of the common femoral, superficial femoral, and popliteal veins, as well as the visualized calf veins. Visualized portions of profunda femoral vein and great saphenous vein unremarkable. No filling defects to suggest DVT on grayscale or color Doppler imaging. Doppler waveforms show normal direction of venous flow, normal respiratory phasicity and response to augmentation. OTHER Bilateral subcutaneous calf edema. Limitations: none IMPRESSION: No femoropopliteal DVT nor evidence of DVT within the visualized calf veins. If clinical symptoms are inconsistent or if there are persistent or worsening symptoms, further imaging (possibly involving the iliac veins) may be warranted. Electronically Signed   By: DLucrezia EuropeM.D.   On: 09/17/2020 15:33   DG Chest Port 1 View  Result Date: 09/17/2020 CLINICAL DATA:  Sepsis. EXAM: PORTABLE CHEST 1 VIEW COMPARISON:  11/14/2015 FINDINGS: Heart size appears normal. No pleural effusion. Diffuse pulmonary vascular congestion identified. No  superimposed airspace consolidation. The visualized osseous structures appear unremarkable. IMPRESSION: Diffuse pulmonary vascular congestion. Electronically Signed   By: Kerby Moors M.D.   On: 09/17/2020 15:08    Subjective: Patient was seen and examined today.  Feeling much improved and wants to go home.  Saturating well on room air.  Husband at bedside.  Discharge Exam: Vitals:   09/21/20 0908 09/21/20 1000  BP: (!) 146/75   Pulse: 95   Resp: 20 19  Temp: 98.2 F (36.8 C)   SpO2: 95%    Vitals:   09/21/20 0608 09/21/20 0729 09/21/20 0908 09/21/20 1000  BP: (!) 142/86  (!) 146/75   Pulse: 90  95   Resp: _0 Temp: 98.3 F (36.8 C)  98.2 F (36.8 C)    TempSrc: Oral     SpO2: 96% 92% 95%   Weight:      Height:        General: Pt is alert, awake, not in acute distress Cardiovascular: RRR, S1/S2 +, no rubs, no gallops Respiratory: CTA bilaterally, no wheezing, no rhonchi Abdominal: Soft, NT, ND, bowel sounds + Extremities: no edema, no cyanosis   The results of significant diagnostics from this hospitalization (including imaging, microbiology, ancillary and laboratory) are listed below for reference.    Microbiology: Recent Results (from the past 240 hour(s))  Resp Panel by RT-PCR (Flu A&B, Covid) Nasopharyngeal Swab     Status: None   Collection Time: 09/17/20  2:19 PM   Specimen: Nasopharyngeal Swab; Nasopharyngeal(NP) swabs in vial transport medium  Result Value Ref Range Status   SARS Coronavirus 2 by RT PCR NEGATIVE NEGATIVE Final    Comment: (NOTE) SARS-CoV-2 target nucleic acids are NOT DETECTED.  The SARS-CoV-2 RNA is generally detectable in upper respiratory specimens during the acute phase of infection. The lowest concentration of SARS-CoV-2 viral copies this assay can detect is 138 copies/mL. A negative result does not preclude SARS-Cov-2 infection and should not be used as the sole basis for treatment or other patient management decisions. A negative result may occur with  improper specimen collection/handling, submission of specimen other than nasopharyngeal swab, presence of viral mutation(s) within the areas targeted by this assay, and inadequate number of viral copies(<138 copies/mL). A negative result must be combined with clinical observations, patient history, and epidemiological information. The expected result is Negative.  Fact Sheet for Patients:  EntrepreneurPulse.com.au  Fact Sheet for Healthcare Providers:  IncredibleEmployment.be  This test is no t yet approved or cleared by the [place] FDA and  has been authorized for detection and/or diagnosis of  SARS-CoV-2 by FDA under an Emergency Use Authorization (EUA). This EUA will remain  in effect (meaning this test can be used) for the duration of the COVID-19 declaration under Section 564(b)(1) of the Act, 21 U.S.C.section 360bbb-3(b)(1), unless the authorization is terminated  or revoked sooner.       Influenza A by PCR NEGATIVE NEGATIVE Final   Influenza B by PCR NEGATIVE NEGATIVE Final    Comment: (NOTE) The Xpert Xpress SARS-CoV-2/FLU/RSV plus assay is intended as an aid in the diagnosis of influenza from Nasopharyngeal swab specimens and should not be used as a sole basis for treatment. Nasal washings and aspirates are unacceptable for Xpert Xpress SARS-CoV-2/FLU/RSV testing.  Fact Sheet for Patients: EntrepreneurPulse.com.au  Fact Sheet for Healthcare Providers: IncredibleEmployment.be  This test is not yet approved or cleared by the [place] FDA and has been authorized for detection and/or diagnosis of SARS-CoV-2 by  FDA under an Emergency Use Authorization (EUA). This EUA will remain in effect (meaning this test can be used) for the duration of the COVID-19 declaration under Section 564(b)(1) of the Act, 21 U.S.C. section 360bbb-3(b)(1), unless the authorization is terminated or revoked.  Performed at [facility], [address] [place], [place] [address]   Blood Culture (routine x 2)     Status: None (Preliminary result)   Collection Time: 09/17/20  2:19 PM   Specimen: BLOOD  Result Value Ref Range Status   Specimen Description BLOOD BLOOD LEFT HAND  Final   Special Requests   Final    BOTTLES DRAWN AEROBIC AND ANAEROBIC Blood Culture results may not be optimal due to an inadequate volume of blood received in culture bottles   Culture   Final    NO GROWTH 4 DAYS Performed at [facility], [address], [place], [place] [address]    Report Status PENDING  Incomplete  Blood Culture (routine x 2)      Status: None (Preliminary result)   Collection Time: 09/17/20  2:19 PM   Specimen: BLOOD  Result Value Ref Range Status   Specimen Description BLOOD LEFT ANTECUBITAL  Final   Special Requests   Final    BOTTLES DRAWN AEROBIC AND ANAEROBIC Blood Culture adequate volume   Culture   Final    NO GROWTH 4 DAYS Performed at [facility], [address], [place], [place] [address]    Report Status PENDING  Incomplete  Urine Culture     Status: None   Collection Time: 09/17/20  4:28 PM   Specimen: In/Out Cath Urine  Result Value Ref Range Status   Specimen Description   Final    IN/OUT CATH URINE Performed at [facility], [address], [place], [place] [address]    Special Requests   Final    NONE Performed at [facility], [address] [place], [place] [address]    Culture   Final    NO GROWTH Performed at [facility], [place] [address] [place], [place] [address]    Report Status 09/19/2020 FINAL  Final  Surgical pcr screen     Status: None   Collection Time: 09/19/20 12:47 PM   Specimen: Nasal Mucosa; Nasal Swab  Result Value Ref Range Status   MRSA, PCR NEGATIVE NEGATIVE Final   Staphylococcus aureus NEGATIVE NEGATIVE Final    Comment: (NOTE) The Xpert SA Assay (FDA approved for NASAL specimens in patients 63 years of age and older), is one component of a comprehensive surveillance program. It is not intended to diagnose infection nor to guide or monitor treatment. Performed at [facility], [place],  [address]   Respiratory (~20 pathogens) panel by PCR     Status: None   Collection Time: 09/19/20 12:47 PM   Specimen: Nasopharyngeal Swab; Respiratory  Result Value Ref Range Status   Adenovirus NOT DETECTED NOT DETECTED Final   Coronavirus 229E NOT DETECTED NOT DETECTED Final    Comment: (NOTE) The Coronavirus on the Respiratory Panel, DOES NOT test for the novel  Coronavirus (2019 nCoV)     Coronavirus HKU1 NOT DETECTED NOT DETECTED Final   Coronavirus NL63 NOT DETECTED NOT DETECTED Final   Coronavirus OC43 NOT DETECTED NOT DETECTED Final   Metapneumovirus NOT DETECTED NOT DETECTED Final   Rhinovirus / Enterovirus NOT DETECTED NOT DETECTED Final   Influenza A NOT DETECTED NOT DETECTED Final   Influenza  B NOT DETECTED NOT DETECTED Final   Parainfluenza Virus 1 NOT DETECTED NOT DETECTED Final   Parainfluenza Virus 2 NOT DETECTED NOT DETECTED Final   Parainfluenza Virus 3 NOT DETECTED NOT DETECTED Final   Parainfluenza Virus 4 NOT DETECTED NOT DETECTED Final   Respiratory Syncytial Virus NOT DETECTED NOT DETECTED Final   Bordetella pertussis NOT DETECTED NOT DETECTED Final   Bordetella Parapertussis NOT DETECTED NOT DETECTED Final   Chlamydophila pneumoniae NOT DETECTED NOT DETECTED Final   Mycoplasma pneumoniae NOT DETECTED NOT DETECTED Final    Comment: Performed at [facility], [place] [address] [place], [place] [address]     Labs: BNP (last 3 results) Recent Labs    09/17/20 1349  BNP 57.3   Basic Metabolic Panel: Recent Labs  Lab 09/17/20 1349 09/18/20 0417 09/19/20 0337  NA 138 143 140  K 3.6 4.2 3.8  CL 107 110 106  CO2 _0 GLUCOSE 110* 135* 145*  BUN _1 CREATININE 0.81 0.66 0.58  CALCIUM 8.2* 8.9 8.4*   Liver Function Tests: Recent Labs  Lab 09/17/20 1349  AST 26  ALT 17  ALKPHOS 64  BILITOT 1.1  PROT 6.0*  ALBUMIN 3.7   No results for input(s): LIPASE, AMYLASE in the last 168 hours. No results for input(s): AMMONIA in the last 168 hours. CBC: Recent Labs  Lab 09/17/20 1349 09/17/20 1545 09/18/20 0417 09/18/20 1435 09/19/20 0337 09/21/20 0617  WBC 15.1*  --  10.0  --  9.6 7.4  NEUTROABS 10.8*  --   --   --   --   --   HGB 7.0*  --  6.6* 7.8* 7.8* 8.2*  HCT 22.1* 19.9* 21.4* 24.5* 24.4* 26.2*  MCV 108.9*  --  110.3*  --  102.1* 105.6*  PLT 261  --  268  --  266 275   Cardiac Enzymes: No results for input(s):  CKTOTAL, CKMB, CKMBINDEX, TROPONINI in the last 168 hours. BNP: Invalid input(s): POCBNP CBG: No results for input(s): GLUCAP in the last 168 hours. D-Dimer No results for input(s): DDIMER in the last 72 hours. Hgb A1c No results for input(s): HGBA1C in the last 72 hours. Lipid Profile No results for input(s): CHOL, HDL, LDLCALC, TRIG, CHOLHDL, LDLDIRECT in the last 72 hours. Thyroid function studies No results for input(s): TSH, T4TOTAL, T3FREE, THYROIDAB in the last 72 hours.  Invalid input(s): FREET3 Anemia work up No results for input(s): VITAMINB12, FOLATE, FERRITIN, TIBC, IRON, RETICCTPCT in the last 72 hours. Urinalysis    Component Value Date/Time   COLORURINE [place] (A) 09/17/2020 1628   APPEARANCEUR CLEAR (A) 09/17/2020 1628   LABSPEC 1.010 09/17/2020 1628   PHURINE 5.0 09/17/2020 1628   GLUCOSEU NEGATIVE 09/17/2020 1628   HGBUR NEGATIVE 09/17/2020 1628   BILIRUBINUR NEGATIVE 09/17/2020 1628   BILIRUBINUR positive 04/05/2018 1633   KETONESUR NEGATIVE 09/17/2020 1628   PROTEINUR NEGATIVE 09/17/2020 1628   UROBILINOGEN >=8.0 (A) 04/05/2018 1633   NITRITE POSITIVE (A) 09/17/2020 1628   LEUKOCYTESUR NEGATIVE 09/17/2020 1628   Sepsis Labs Invalid input(s): PROCALCITONIN,  WBC,  LACTICIDVEN Microbiology Recent Results (from the past 240 hour(s))  Resp Panel by RT-PCR (Flu A&B, Covid) Nasopharyngeal Swab     Status: None   Collection Time: 09/17/20  2:19 PM   Specimen: Nasopharyngeal Swab; Nasopharyngeal(NP) swabs in vial transport medium  Result Value Ref Range Status   SARS Coronavirus 2 by RT PCR NEGATIVE NEGATIVE Final    Comment: (NOTE)  SARS-CoV-2 target nucleic acids are NOT DETECTED.  The SARS-CoV-2 RNA is generally detectable in upper respiratory specimens during the acute phase of infection. The lowest concentration of SARS-CoV-2 viral copies this assay can detect is 138 copies/mL. A negative result does not preclude SARS-Cov-2 infection and should not be  used as the sole basis for treatment or other patient management decisions. A negative result may occur with  improper specimen collection/handling, submission of specimen other than nasopharyngeal swab, presence of viral mutation(s) within the areas targeted by this assay, and inadequate number of viral copies(<138 copies/mL). A negative result must be combined with clinical observations, patient history, and epidemiological information. The expected result is Negative.  Fact Sheet for Patients:  EntrepreneurPulse.com.au  Fact Sheet for Healthcare Providers:  IncredibleEmployment.be  This test is no t yet approved or cleared by the [place] FDA and  has been authorized for detection and/or diagnosis of SARS-CoV-2 by FDA under an Emergency Use Authorization (EUA). This EUA will remain  in effect (meaning this test can be used) for the duration of the COVID-19 declaration under Section 564(b)(1) of the Act, 21 U.S.C.section 360bbb-3(b)(1), unless the authorization is terminated  or revoked sooner.       Influenza A by PCR NEGATIVE NEGATIVE Final   Influenza B by PCR NEGATIVE NEGATIVE Final    Comment: (NOTE) The Xpert Xpress SARS-CoV-2/FLU/RSV plus assay is intended as an aid in the diagnosis of influenza from Nasopharyngeal swab specimens and should not be used as a sole basis for treatment. Nasal washings and aspirates are unacceptable for Xpert Xpress SARS-CoV-2/FLU/RSV testing.  Fact Sheet for Patients: EntrepreneurPulse.com.au  Fact Sheet for Healthcare Providers: IncredibleEmployment.be  This test is not yet approved or cleared by the [place] FDA and has been authorized for detection and/or diagnosis of SARS-CoV-2 by FDA under an Emergency Use Authorization (EUA). This EUA will remain in effect (meaning this test can be used) for the duration of the COVID-19 declaration under Section  564(b)(1) of the Act, 21 U.S.C. section 360bbb-3(b)(1), unless the authorization is terminated or revoked.  Performed at [facility], [place] [place], [place] [address]   Blood Culture (routine x 2)     Status: None (Preliminary result)   Collection Time: 09/17/20  2:19 PM   Specimen: BLOOD  Result Value Ref Range Status   Specimen Description BLOOD BLOOD LEFT HAND  Final   Special Requests   Final    BOTTLES DRAWN AEROBIC AND ANAEROBIC Blood Culture results may not be optimal due to an inadequate volume of blood received in culture bottles   Culture   Final    NO GROWTH 4 DAYS Performed at [facility], [address] [place], [place] [address]    Report Status PENDING  Incomplete  Blood Culture (routine x 2)     Status: None (Preliminary result)   Collection Time: 09/17/20  2:19 PM   Specimen: BLOOD  Result Value Ref Range Status   Specimen Description BLOOD LEFT ANTECUBITAL  Final   Special Requests   Final    BOTTLES DRAWN AEROBIC AND ANAEROBIC Blood Culture adequate volume   Culture   Final    NO GROWTH 4 DAYS Performed at [facility], [address] [place], [place] [address]    Report Status PENDING  Incomplete  Urine Culture     Status: None   Collection Time: 09/17/20  4:28 PM   Specimen: In/Out Cath Urine  Result Value Ref Range Status  Specimen Description   Final    IN/OUT CATH URINE Performed at [facility], [address] [place], [place] [address]    Special Requests   Final    NONE Performed at [facility], [address] [place], [place] [address]    Culture   Final    NO GROWTH Performed at [facility], [place] [address] [place], [place] [address]    Report Status 09/19/2020 FINAL  Final  Surgical pcr screen     Status: None   Collection Time: 09/19/20 12:47 PM   Specimen: Nasal Mucosa; Nasal Swab  Result Value Ref Range Status   MRSA, PCR NEGATIVE NEGATIVE Final    Staphylococcus aureus NEGATIVE NEGATIVE Final    Comment: (NOTE) The Xpert SA Assay (FDA approved for NASAL specimens in patients 40 years of age and older), is one component of a comprehensive surveillance program. It is not intended to diagnose infection nor to guide or monitor treatment. Performed at [facility], [place], [place] [address]   Respiratory (~20 pathogens) panel by PCR     Status: None   Collection Time: 09/19/20 12:47 PM   Specimen: Nasopharyngeal Swab; Respiratory  Result Value Ref Range Status   Adenovirus NOT DETECTED NOT DETECTED Final   Coronavirus 229E NOT DETECTED NOT DETECTED Final    Comment: (NOTE) The Coronavirus on the Respiratory Panel, DOES NOT test for the novel  Coronavirus (2019 nCoV)    Coronavirus HKU1 NOT DETECTED NOT DETECTED Final   Coronavirus NL63 NOT DETECTED NOT DETECTED Final   Coronavirus OC43 NOT DETECTED NOT DETECTED Final   Metapneumovirus NOT DETECTED NOT DETECTED Final   Rhinovirus / Enterovirus NOT DETECTED NOT DETECTED Final   Influenza A NOT DETECTED NOT DETECTED Final   Influenza B NOT DETECTED NOT DETECTED Final   Parainfluenza Virus 1 NOT DETECTED NOT DETECTED Final   Parainfluenza Virus 2 NOT DETECTED NOT DETECTED Final   Parainfluenza Virus 3 NOT DETECTED NOT DETECTED Final   Parainfluenza Virus 4 NOT DETECTED NOT DETECTED Final   Respiratory Syncytial Virus NOT DETECTED NOT DETECTED Final   Bordetella pertussis NOT DETECTED NOT DETECTED Final   Bordetella Parapertussis NOT DETECTED NOT DETECTED Final   Chlamydophila pneumoniae NOT DETECTED NOT DETECTED Final   Mycoplasma pneumoniae NOT DETECTED NOT DETECTED Final    Comment: Performed at [facility], [place]. [address] [place], [place] [address]    Time coordinating discharge: Over 30 minutes  SIGNED:  Lorella Nimrod, MD  [facility] 09/21/2020, 11:42 AM  If 7PM-7AM, please contact night-coverage [email or web address]  This record  has been created using Systems analyst. Errors have been sought and corrected,but may not always be located. Such creation errors do not reflect on the standard of care.

## 2020-09-22 ENCOUNTER — Other Ambulatory Visit: Payer: Self-pay | Admitting: *Deleted

## 2020-09-22 LAB — CULTURE, BLOOD (ROUTINE X 2)
Culture: NO GROWTH
Culture: NO GROWTH
Special Requests: ADEQUATE

## 2020-09-22 LAB — LEGIONELLA PNEUMOPHILA SEROGP 1 UR AG: L. pneumophila Serogp 1 Ur Ag: NEGATIVE

## 2020-09-22 LAB — VITAMIN B1: Vitamin B1 (Thiamine): 174.1 nmol/L (ref 66.5–200.0)

## 2020-09-22 NOTE — Patient Outreach (Signed)
[facility]) Care Management  09/22/2020  YOON KNEE [birth year] UK:[id number]   Transition of care telephone call  Referral received:09/19/20 Initial outreach:09/22/20 Insurance: [facility]   Initial unsuccessful telephone call to patient's preferred number in order to complete transition of care assessment; no answer, left HIPAA compliant voicemail message requesting return call.   Objective: Per the electronic medical record, [name] was hospitalized at [facility] from  8/25-8/28/22 for Sepsis,Asthma exacerbation, UTI, macrocytic Anemia hemoglobin 6.6 , 2 units prbc's and hemoglobin 8.2 on day of discharge  . Comorbidities include: GERD, Diabetes type 2, laparoscopic sleeve gastrectomy .She  was discharged to home on 09/19/20 without the need for home health services or durable medical equipment per the discharge summary.   Plan: This RNCM will route unsuccessful outreach letter with [place] Management pamphlet and 24 hour Nurse Advice Line Magnet to [place] Management clinical pool to be mailed to patient's home address. This RNCM will attempt another outreach within 4 business days.   Joylene Draft, RN, BSN  [place] Management Coordinator  [phone]- Mobile [phone]- Toll Free Main Office

## 2020-09-23 LAB — COMP PANEL: LEUKEMIA/LYMPHOMA

## 2020-09-24 DIAGNOSIS — J189 Pneumonia, unspecified organism: Secondary | ICD-10-CM | POA: Diagnosis not present

## 2020-09-24 LAB — FUNGITELL, SERUM: Fungitell Result: 70 pg/mL (ref <80)

## 2020-09-25 ENCOUNTER — Other Ambulatory Visit: Payer: Self-pay | Admitting: *Deleted

## 2020-09-25 NOTE — Patient Outreach (Signed)
[facility]) Care Management  09/25/2020  Bethany Scott Mar 23, 1955 [id number]   Transition of care call Referral received: 09/19/20 Initial outreach attempt: 09/22/20 Insurance: [facility]    2nd unsuccessful telephone call to patient's preferred contact number in order to complete post [facility] discharge transition of care assessment , no answer left HIPAA compliant message requesting return call.    Objective: Per the electronic medical record, [name] was [facility] from  8/25-8/28/22 for Sepsis,Asthma exacerbation, UTI, macrocytic Anemia hemoglobin 6.6 , 2 units prbc's and hemoglobin 8.2 on day of discharge  . Comorbidities include: GERD, Diabetes type 2, laparoscopic sleeve gastrectomy .She  was discharged to home on 09/19/20 without the need for home health services or durable medical equipment per the discharge summary.    Plan If no return call from patient will attempt 3rd outreach in the next 4 business days.    Bethany Draft, RN, BSN  [facility] Management Coordinator  [phone]- Mobile [phone]- Toll Free Main Office

## 2020-09-30 ENCOUNTER — Other Ambulatory Visit: Payer: Self-pay | Admitting: *Deleted

## 2020-09-30 NOTE — Patient Outreach (Signed)
[facility]) Care Management  09/30/2020  [name] 11-26-1955 UK:[id number]   Transition of care call Referral received: 09/19/20 Initial outreach attempt: 09/22/20 Insurance: [employer] unsuccessful telephone call to patient's preferred contact number in order to complete post hospital discharge transition of care assessment; no answer, left HIPAA compliant message requesting return call.   Objective: Per the electronic medical record, Bethany Scott was hospitalized at [facility] from  8/25-8/28/22 for Sepsis,Asthma exacerbation, UTI, macrocytic Anemia hemoglobin 6.6 , 2 units prbc's and hemoglobin 8.2 on day of discharge  . Comorbidities include: GERD, Diabetes type 2, laparoscopic sleeve gastrectomy .She  was discharged to home on 09/19/20 without the need for home health services or durable medical equipment per the discharge summary.  Plan: If no return call from patient, will plan return call in the next 3 weeks.    Joylene Draft, RN, BSN  [place] Management Coordinator  [phone]- Mobile [phone]- Toll Free Main Office

## 2020-10-01 DIAGNOSIS — J189 Pneumonia, unspecified organism: Secondary | ICD-10-CM | POA: Diagnosis not present

## 2020-10-06 ENCOUNTER — Encounter: Payer: Self-pay | Admitting: Oncology

## 2020-10-06 ENCOUNTER — Other Ambulatory Visit: Payer: Self-pay

## 2020-10-06 ENCOUNTER — Inpatient Hospital Stay: Payer: 59

## 2020-10-06 ENCOUNTER — Inpatient Hospital Stay: Payer: 59 | Attending: Oncology | Admitting: Oncology

## 2020-10-06 VITALS — BP 126/67 | HR 85 | Temp 98.7°F | Resp 20 | Wt 215.3 lb

## 2020-10-06 DIAGNOSIS — D539 Nutritional anemia, unspecified: Secondary | ICD-10-CM

## 2020-10-06 DIAGNOSIS — Z9884 Bariatric surgery status: Secondary | ICD-10-CM

## 2020-10-06 LAB — RETIC PANEL
Immature Retic Fract: 11.6 % (ref 2.3–15.9)
RBC.: 3.85 MIL/uL — ABNORMAL LOW (ref 3.87–5.11)
Retic Count, Absolute: 109.7 10*3/uL (ref 19.0–186.0)
Retic Ct Pct: 2.9 % (ref 0.4–3.1)
Reticulocyte Hemoglobin: 35.4 pg (ref >27.9)

## 2020-10-06 LAB — IRON AND TIBC
Iron: 51 ug/dL (ref 28–170)
Saturation Ratios: 15 % (ref 10.4–31.8)
TIBC: 342 ug/dL (ref 250–450)
UIBC: 291 ug/dL

## 2020-10-06 LAB — LACTATE DEHYDROGENASE: LDH: 127 U/L (ref 98–192)

## 2020-10-06 LAB — TECHNOLOGIST SMEAR REVIEW
Plt Morphology: NORMAL
RBC MORPHOLOGY: NORMAL
WBC MORPHOLOGY: NORMAL

## 2020-10-06 LAB — FERRITIN: Ferritin: 107 ng/mL (ref 11–307)

## 2020-10-06 NOTE — Progress Notes (Signed)
Hematology/Oncology Consult note [facility] Telephone:([phone] [phone]   Patient Care Team: [name], MD as PCP - General (Family Medicine) Ammie Dalton, Okey Regal, MD (Obstetrics and Gynecology) Alfonzo Feller, RN as [facility]  REFERRING PROVIDER: Ottie Glazier, MD  CHIEF COMPLAINTS/REASON FOR VISIT:  Evaluation of anemia  HISTORY OF PRESENTING ILLNESS:   Bethany Scott is a  65 y.o.  female with PMH listed below was seen in consultation at the request of  Ottie Glazier, MD  for evaluation of anemia Patient has a history of gastric bypass.  She has a history of iron deficiency anemia and folic acid deficiency. Patient was recently admitted from 09/17/2020 - 09/21/2020 for pneumonia.  Chest x-ray was concerning for bilateral opacities/pulmonary vascular congestion.  CTA was negative for PE but showed extensive right-sided infiltrate and groundglass opacities.  Concerning for pneumonia.  Reports was complicated by asthma exacerbation.  Patient was treated with antibiotics.  She was also found to have macrocytic anemia, hemoglobin 6.6.  She received 2 units of PRBC and hemoglobin increased to 8.2 at discharge.  Patient's B12 and folate levels were adequae..  Patient reports feeling tired and fatigued.  Dozing off during football game yesterday.  Some shortness of breath with exertion as well Denies any bloody or black stool.  Denies abdominal pain, unintentional weight loss, night sweats or fever.  Review of Systems  Constitutional:  Positive for fatigue. Negative for appetite change, chills and fever.  HENT:   Negative for hearing loss and voice change.   Eyes:  Negative for eye problems.  Respiratory:  Negative for chest tightness and cough.   Cardiovascular:  Negative for chest pain.  Gastrointestinal:  Negative for abdominal distention, abdominal pain and blood in stool.  Endocrine: Negative for hot  flashes.  Genitourinary:  Negative for difficulty urinating and frequency.   Musculoskeletal:  Negative for arthralgias.  Skin:  Negative for itching and rash.  Neurological:  Negative for extremity weakness.  Hematological:  Negative for adenopathy.  Psychiatric/Behavioral:  Negative for confusion.    MEDICAL HISTORY:  Past Medical History:  Diagnosis Date   Allergy    Anemia    Anxiety    Arthritis    OA Knee; non-specific autoimmune process followed by [facility]/Rheumatology.   Asthma    Blood transfusion without reported diagnosis YRS AGO   COVID-19 03/2018   Depression    Diabetes mellitus without complication (HCC)    Gastric ulcer, unspecified as acute or chronic, without mention of hemorrhage, perforation, or obstruction YRS AGO   GERD (gastroesophageal reflux disease)    Hyperlipidemia    Hypertension    Migraine    hx of migraines   Obesity, unspecified    Other chronic cystitis    Personal history of colonic polyps    PONV (postoperative nausea and vomiting)    NAUSEA, SCOPOLAMINE PATCH HELPED WITH LAST SURGERIES   Pre-diabetes     SURGICAL HISTORY: Past Surgical History:  Procedure Laterality Date   ABDOMINAL HYSTERECTOMY  01/25/1994   uterine fibroids; DUB; cervical dysplasia; ovaries resected two years later.     APPENDECTOMY     BILATERAL OOPHORECTOMY  2001   CESAREAN SECTION     x 2   COLON SURGERY  YRS AGO   SIGMOIDECTOMY   Colonoscopy  09/08/2012   diverticulosis, hemorrhoids.  [name].  Symptoms:  anemia, hemoccult +.   ESOPHAGOGASTRODUODENOSCOPY  09/08/2012   +HH.  [name].  Symptoms: anemia,  hemoccult +.   HIP ARTHROSCOPY Right    LAPAROSCOPIC GASTRIC SLEEVE RESECTION N/A 03/01/2016   Procedure: LAPAROSCOPIC GASTRIC SLEEVE RESECTION, UPPER ENDO;  Surgeon: [name], MD;  Location: [facility];  Service: General;  Laterality: N/A;   LAPAROSCOPIC LYSIS OF ADHESIONS N/A 02/10/2016   Procedure: LAPAROSCOPIC LYSIS OF ADHESIONS;  Surgeon: [name],  MD;  Location: [facility];  Service: General;  Laterality: N/A;   LAPAROSCOPY N/A 02/10/2016   Procedure: LAPAROSCOPY DIAGNOSTIC;  Surgeon: [name], MD;  Location: [facility];  Service: General;  Laterality: N/A;   NASAL SEPTUM SURGERY     NECK SURGERY     Rheumatology Consult  11/26/2011   multiple arthralgias. Autoimmune labs negative.  Rx for Plaquenil for non-specific autoimmune process.  [name].   SIGMOID RESECTION / RECTOPEXY     SPINE SURGERY     TONSILLECTOMY AND ADENOIDECTOMY     TOTAL HIP ARTHROPLASTY Right 09/27/2017   Procedure: TOTAL HIP ARTHROPLASTY ANTERIOR APPROACH;  Surgeon: [name], MD;  Location: [facility];  Service: Orthopedics;  Laterality: Right;   TOTAL HIP ARTHROPLASTY Left 12/27/2017   Procedure: TOTAL HIP ARTHROPLASTY ANTERIOR APPROACH;  Surgeon: [name], MD;  Location: [facility];  Service: Orthopedics;  Laterality: Left;    SOCIAL HISTORY: Social History   Socioeconomic History   Marital status: Married    Spouse name: Not on file   Number of children: 2   Years of education: 12   Highest education level: Not on file  Occupational History   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      Children: 2 sons (24, 54); 2 grandchildren  (13yo Atlast, 56 yo August)      Lives: with husband, [name]/son.      Employment: check in at [employer] in [place] x 2004;  loves job.      Tobacco abuse:  Former smoker.      Alcohol:  None; holidays only      Exercise: none in 2017     Seatbelt: 100%; no texting   Social Determinants of Health   Financial Resource Strain: Not on file  Food Insecurity: Not on file  Transportation Needs: Not on file  Physical Activity: Not on file  Stress: Not on file  Social Connections: Not on file  Intimate Partner Violence: Not on file    FAMILY HISTORY: Family History  Problem Relation Age of Onset   Bipolar disorder Father    Transient ischemic attack Father    Dementia Father        secondary to head trauma   Hypertension Father    Diabetes Father    CAD Father    Cancer Mother 8       lung   Migraines Mother  Hypothyroidism Mother    Hypertension Sister    Atrial fibrillation Sister    Hypothyroidism Sister    Alcohol abuse Brother    Hypertension Brother    Alcohol abuse Brother    Atrial fibrillation Maternal Grandmother    COPD Maternal Grandmother    Diabetes Maternal Grandmother    Hyperlipidemia Maternal Grandmother    Diabetes Maternal Grandfather    Diabetes Paternal Grandfather    Heart disease Paternal Grandfather    Breast cancer Sister 11    ALLERGIES:  is allergic to cephalexin, cephalosporins, and penicillins.  MEDICATIONS:  Current Outpatient Medications  Medication Sig Dispense Refill   albuterol (VENTOLIN HFA) 108 (90 Base) MCG/ACT inhaler Inhale 1-2 puffs into the lungs every 6 (six) hours as needed for wheezing or shortness of breath. 1 each 0   azelastine (OPTIVAR) 0.05 % ophthalmic solution Place 1 drop into both eyes daily.     Azelastine-Fluticasone 137-50 MCG/ACT SUSP Place 2 sprays into the nose 2 (two) times a day. 23 g 0   budesonide-formoterol (SYMBICORT) 160-4.5 MCG/ACT inhaler INHALE 2 PUFFS BY MOUTH 2 TIMES DAILY 10.2 g 4   busPIRone (BUSPAR) 10 MG tablet Take 1 tablet (10 mg total) by mouth 3 (three) times daily 270 tablet 3   chlorpheniramine-HYDROcodone  (TUSSIONEX PENNKINETIC ER) 10-8 MG/5ML SUER Take 5 mLs by mouth at bedtime as needed for cough. 140 mL 0   dexlansoprazole (DEXILANT) 60 MG capsule Take 1 capsule (60 mg total) by mouth once daily 90 capsule 1   dextromethorphan-guaiFENesin (MUCINEX DM) 30-600 MG 12hr tablet Take 1 tablet by mouth 2 (two) times daily. 30 tablet 1   DULoxetine (CYMBALTA) 60 MG capsule Take 1 capsule (60 mg total) by mouth 2 (two) times daily. 180 capsule 1   famotidine (PEPCID) 40 MG tablet Take 1 tablet (40 mg total) by mouth nightly 90 tablet 1   ferrous OACZYSAY-T01-SWFUXNA C-folic acid (TRINSICON / FOLTRIN) capsule Take 1 capsule by mouth 2 (two) times daily after a meal. 120 capsule 1   gabapentin (NEURONTIN) 300 MG capsule TAKE 1 TO 3 CAPSULES BY MOUTH THREE TIMES DAILY 810 capsule 2   HYDROcodone-acetaminophen (NORCO) 10-325 MG tablet Take 1 tablet by mouth every 6 (six) hours as needed for moderate pain.     levocetirizine (XYZAL) 5 MG tablet TAKE 1 TABLET BY MOUTH EVERY EVENING 90 tablet 3   methocarbamol (ROBAXIN) 750 MG tablet Take 750 mg by mouth 3 (three) times daily as needed for muscle spasms.     montelukast (SINGULAIR) 10 MG tablet Take 1 tablet (10 mg total) by mouth daily. 90 tablet 3   nystatin cream (MYCOSTATIN) Apply two to three times per day as needed 30 g 3   promethazine (PHENERGAN) 25 MG tablet TAKE 1 TABLET BY MOUTH EVERY 8 HOURS AS NEEDED FOR NAUSEA OR VOMITING 20 tablet 3   fluconazole (DIFLUCAN) 150 MG tablet Take one tablet as needed for yeast infection due to antibiotic use. Repeat in one week if needed. (Patient not taking: Reported on 10/06/2020) 2 tablet 3   hydrocortisone 2.5 % cream TWICE DAILY TO AFFECTED AREA FOR UP TO 2 WEEKS AS NEEDED FOR RASH (Patient not taking: Reported on 10/06/2020) 28 g 0   predniSONE (DELTASONE) 50 MG tablet Take 1 tablet daily for next 3 days. (Patient not taking: Reported on 10/06/2020) 3 tablet 0   triamcinolone (KENALOG) 0.1 % paste Dry ulcer with  tissue and then apply paste to affected area twice a day  until no longer tender. (Patient not taking: Reported on 10/06/2020) 5 g 12   No current facility-administered medications for this visit.     PHYSICAL EXAMINATION: ECOG PERFORMANCE STATUS: 1 - Symptomatic but completely ambulatory Vitals:   10/06/20 1500  BP: 126/67  Pulse: 85  Resp: 20  Temp: 98.7 F (37.1 C)  SpO2: 100%   Filed Weights   10/06/20 1500  Weight: 215 lb 4.8 oz (97.7 kg)    Physical Exam Constitutional:      General: She is not in acute distress. HENT:     Head: Normocephalic and atraumatic.  Eyes:     General: No scleral icterus. Cardiovascular:     Rate and Rhythm: Normal rate and regular rhythm.     Heart sounds: Normal heart sounds.  Pulmonary:     Effort: Pulmonary effort is normal. No respiratory distress.     Breath sounds: No wheezing.  Abdominal:     General: Bowel sounds are normal. There is no distension.     Palpations: Abdomen is soft.  Musculoskeletal:        General: No deformity. Normal range of motion.     Cervical back: Normal range of motion and neck supple.  Skin:    General: Skin is warm and dry.     Findings: No erythema or rash.  Neurological:     Mental Status: She is alert and oriented to person, place, and time. Mental status is at baseline.     Cranial Nerves: No cranial nerve deficit.     Coordination: Coordination normal.  Psychiatric:        Mood and Affect: Mood normal.    LABORATORY DATA:  I have reviewed the data as listed Lab Results  Component Value Date   WBC 7.4 09/21/2020   HGB 8.2 (L) 09/21/2020   HCT 26.2 (L) 09/21/2020   MCV 105.6 (H) 09/21/2020   PLT 275 09/21/2020   Recent Labs    09/17/20 1349 09/18/20 0417 09/19/20 0337  NA 138 143 140  K 3.6 4.2 3.8  CL 107 110 106  CO2 25 27 29   GLUCOSE 110* 135* 145*  BUN 15 12 12   CREATININE 0.81 0.66 0.58  CALCIUM 8.2* 8.9 8.4*  GFRNONAA >60 >60 >60  PROT 6.0*  --   --   ALBUMIN 3.7  --    --   AST 26  --   --   ALT 17  --   --   ALKPHOS 64  --   --   BILITOT 1.1  --   --    Iron/TIBC/Ferritin/ %Sat    Component Value Date/Time   IRON 51 10/06/2020 1602   IRON 92 04/30/2017 1055   TIBC 342 10/06/2020 1602   TIBC 264 04/27/2016 1744   FERRITIN 107 10/06/2020 1602   IRONPCTSAT 15 10/06/2020 1602   IRONPCTSAT 16 04/27/2016 1744   IRONPCTSAT 41 09/17/2013 0853      RADIOGRAPHIC STUDIES: I have personally reviewed the radiological images as listed and agreed with the findings in the report. DG Chest 2 View  Result Date: 09/21/2020 CLINICAL DATA:  Shortness of breath. EXAM: CHEST - 2 VIEW COMPARISON:  September 17, 2020. FINDINGS: The heart size and mediastinal contours are within normal limits. Both lungs are clear. The visualized skeletal structures are unremarkable. IMPRESSION: No active cardiopulmonary disease. Electronically Signed   By: Marijo Conception M.D.   On: 09/21/2020 10:14   CT Angio Chest Pulmonary Embolism (PE) W or  WO Contrast  Result Date: 09/18/2020 CLINICAL DATA:  Rule out pulmonary embolus. Patient presents with hypertension and positive D-dimer. EXAM: CT ANGIOGRAPHY CHEST WITH CONTRAST TECHNIQUE: Multidetector CT imaging of the chest was performed using the standard protocol during bolus administration of intravenous contrast. Multiplanar CT image reconstructions and MIPs were obtained to evaluate the vascular anatomy. CONTRAST:  150m OMNIPAQUE IOHEXOL 350 MG/ML SOLN COMPARISON:  None. FINDINGS: Cardiovascular: Suboptimal pulmonary arterial opacification is noted. Within this limitation no signs of central obstructing pulmonary embolus. No suspicious filling defects identified within the lobar pulmonary arteries. Beyond the level of the lobar pulmonary arteries there is too much motion artifact and suboptimal pulmonary opacification to adequately assess for PE. Heart size appears normal. No pericardial effusion. Aortic atherosclerosis. Mediastinum/Nodes: No  enlarged mediastinal, hilar, or axillary lymph nodes. Thyroid gland, trachea, and esophagus demonstrate no significant findings. Lungs/Pleura: Trace right pleural effusion. Extensive right upper lobe airspace densities and ground-glass attenuation which in the acute setting is concerning for pneumonia. Within the right middle lobe there is a nodular density measuring 0.9 cm, image 56/6. Upper Abdomen: No acute abnormalities. Postoperative changes are noted involving the stomach. Musculoskeletal: There is degenerative disc disease within the thoracic spine. No acute or suspicious findings identified. Review of the MIP images confirms the above findings. IMPRESSION: 1. Suboptimal pulmonary arterial opacification is noted. Within this limitation no signs of central obstructing pulmonary embolus. Beyond the level of the lobar pulmonary arteries there is too much motion artifact and suboptimal pulmonary opacification to adequately assess for PE. 2. Extensive right upper lobe airspace densities and ground-glass attenuation is concerning for pneumonia. Follow up CT of the chest in 3 months, after appropriate antibiotic therapy, is recommended to ensure complete resolution and to rule out underlying malignancy. 3. 9 mm nodule in the right middle lobe is indeterminate. Attention on post therapy follow-up imaging CT 4. Aortic atherosclerosis. Aortic Atherosclerosis (ICD10-I70.0). Electronically Signed   By: TKerby MoorsM.D.   On: 09/18/2020 16:49   UKoreaVenous Img Lower Bilateral  Result Date: 09/17/2020 CLINICAL DATA:  Leg swelling x1 week. EXAM: BILATERAL LOWER EXTREMITY VENOUS DOPPLER ULTRASOUND TECHNIQUE: Gray-scale sonography with compression, as well as color and duplex ultrasound, were performed to evaluate the deep venous system(s) from the level of the common femoral vein through the popliteal and proximal calf veins. COMPARISON:  None. FINDINGS: VENOUS Normal compressibility of the common femoral, superficial  femoral, and popliteal veins, as well as the visualized calf veins. Visualized portions of profunda femoral vein and great saphenous vein unremarkable. No filling defects to suggest DVT on grayscale or color Doppler imaging. Doppler waveforms show normal direction of venous flow, normal respiratory phasicity and response to augmentation. OTHER Bilateral subcutaneous calf edema. Limitations: none IMPRESSION: No femoropopliteal DVT nor evidence of DVT within the visualized calf veins. If clinical symptoms are inconsistent or if there are persistent or worsening symptoms, further imaging (possibly involving the iliac veins) may be warranted. Electronically Signed   By: DLucrezia EuropeM.D.   On: 09/17/2020 15:33   DG Chest Port 1 View  Result Date: 09/17/2020 CLINICAL DATA:  Sepsis. EXAM: PORTABLE CHEST 1 VIEW COMPARISON:  11/14/2015 FINDINGS: Heart size appears normal. No pleural effusion. Diffuse pulmonary vascular congestion identified. No superimposed airspace consolidation. The visualized osseous structures appear unremarkable. IMPRESSION: Diffuse pulmonary vascular congestion. Electronically Signed   By: TKerby MoorsM.D.   On: 09/17/2020 15:08   ECHOCARDIOGRAM COMPLETE  Result Date: 09/19/2020    ECHOCARDIOGRAM REPORT  Patient Name:   Bethany Scott Date of Exam: 09/19/2020 Medical Rec #:  [id number]       Height:       67.0 in Accession #:    [id number]      Weight:       245.6 lb Date of Birth:  1955-12-15        BSA:          2.207 m Patient Age:    58 years        BP:           130/59 mmHg Patient Gender: F               HR:           118 bpm. Exam Location:  [facility] Procedure: 2D Echo, Cardiac Doppler and Color Doppler Indications:     CHF-acute systolic Q33.00  History:         Patient has no prior history of Echocardiogram examinations.                  Risk Factors:Hypertension and Diabetes.  Sonographer:     Sherrie Sport Referring Phys:  [id number] FUAD ALESKEROV Diagnosing Phys: Donnelly Angelica  Sonographer  Comments: Suboptimal apical window. IMPRESSIONS  1. Left ventricular ejection fraction, by estimation, is 60 to 65%. The left ventricle has normal function. The left ventricle has no regional wall motion abnormalities. Left ventricular diastolic parameters were normal.  2. Right ventricular systolic function is normal. The right ventricular size is normal.  3. The mitral valve is normal in structure. No evidence of mitral valve regurgitation. No evidence of mitral stenosis.  4. The aortic valve is normal in structure. Aortic valve regurgitation is not visualized. No aortic stenosis is present.  5. The inferior vena cava is normal in size with <50% respiratory variability, suggesting right atrial pressure of 8 mmHg. FINDINGS  Left Ventricle: Left ventricular ejection fraction, by estimation, is 60 to 65%. The left ventricle has normal function. The left ventricle has no regional wall motion abnormalities. The left ventricular internal cavity size was normal in size. There is  no left ventricular hypertrophy. Left ventricular diastolic parameters were normal. Right Ventricle: The right ventricular size is normal. No increase in right ventricular wall thickness. Right ventricular systolic function is normal. Left Atrium: Left atrial size was normal in size. Right Atrium: Right atrial size was normal in size. Pericardium: There is no evidence of pericardial effusion. Mitral Valve: The mitral valve is normal in structure. No evidence of mitral valve regurgitation. No evidence of mitral valve stenosis. Tricuspid Valve: The tricuspid valve is normal in structure. Tricuspid valve regurgitation is not demonstrated. No evidence of tricuspid stenosis. Aortic Valve: The aortic valve is normal in structure. Aortic valve regurgitation is not visualized. No aortic stenosis is present. Aortic valve mean gradient measures 4.5 mmHg. Aortic valve peak gradient measures 9.6 mmHg. Aortic valve area, by VTI measures 2.18 cm. Pulmonic  Valve: The pulmonic valve was not well visualized. Pulmonic valve regurgitation is not visualized. Aorta: The aortic root is normal in size and structure. Venous: The inferior vena cava is normal in size with less than 50% respiratory variability, suggesting right atrial pressure of 8 mmHg. IAS/Shunts: The interatrial septum was not assessed.  LEFT VENTRICLE PLAX 2D LVIDd:         4.90 cm  Diastology LVIDs:         3.50 cm  LV e' medial:    15.00 cm/s  LV PW:         1.40 cm  LV E/e' medial:  8.6 LV IVS:        1.10 cm  LV e' lateral:   10.90 cm/s LVOT diam:     2.00 cm  LV E/e' lateral: 11.8 LV SV:         54 LV SV Index:   24 LVOT Area:     3.14 cm  RIGHT VENTRICLE RV Basal diam:  4.10 cm RV S prime:     16.60 cm/s TAPSE (M-mode): 4.1 cm LEFT ATRIUM             Index       RIGHT ATRIUM           Index LA diam:        3.20 cm 1.45 cm/m  RA Area:     13.80 cm LA Vol (A2C):   51.7 ml 23.43 ml/m RA Volume:   33.30 ml  15.09 ml/m LA Vol (A4C):   84.5 ml 38.29 ml/m LA Biplane Vol: 66.2 ml 30.00 ml/m  AORTIC VALVE                   PULMONIC VALVE AV Area (Vmax):    1.88 cm    PV Vmax:        0.90 m/s AV Area (Vmean):   1.80 cm    PV Peak grad:   3.2 mmHg AV Area (VTI):     2.18 cm    RVOT Peak grad: 2 mmHg AV Vmax:           155.00 cm/s AV Vmean:          94.650 cm/s AV VTI:            0.248 m AV Peak Grad:      9.6 mmHg AV Mean Grad:      4.5 mmHg LVOT Vmax:         92.70 cm/s LVOT Vmean:        54.200 cm/s LVOT VTI:          0.172 m LVOT/AV VTI ratio: 0.69  AORTA Ao Root diam: 2.70 cm MITRAL VALVE                TRICUSPID VALVE MV Area (PHT): 6.77 cm     TR Peak grad:   17.5 mmHg MV Decel Time: 112 msec     TR Vmax:        209.00 cm/s MV E velocity: 129.00 cm/s MV A velocity: 83.60 cm/s   SHUNTS MV E/A ratio:  1.54         Systemic VTI:  0.17 m                             Systemic Diam: 2.00 cm Donnelly Angelica Electronically signed by Donnelly Angelica Signature Date/Time: 09/19/2020/2:19:23 PM    Final        ASSESSMENT & PLAN:  1. Macrocytic anemia   2. History of gastric bypass    09/17/2020, vitamin B12 859,  Iron panel showed iron saturation of 14, ferritin 273 Iron panel ferritin may be falsely elevated due to recent infection. Recommend to repeat CBC, smear, CMP, LDH, haptoglobin, reticulocyte panel, multiple myeloma panel, flow cytometry, LDH.  Discussed about the possibility of bone marrow biopsy if no clear etiology was discovered through peripheral blood work.  She agrees with the plan  Follow-up  to be determined.  Orders Placed This Encounter  Procedures   Ferritin    Standing Status:   Future    Number of Occurrences:   1    Standing Expiration Date:   04/05/2021   Iron and TIBC    Standing Status:   Future    Number of Occurrences:   1    Standing Expiration Date:   10/06/2021   Retic Panel    Standing Status:   Future    Number of Occurrences:   1    Standing Expiration Date:   10/06/2021   Multiple Myeloma Panel (SPEP&IFE w/QIG)    Standing Status:   Future    Number of Occurrences:   1    Standing Expiration Date:   10/06/2021   Kappa/lambda light chains    Standing Status:   Future    Number of Occurrences:   1    Standing Expiration Date:   10/06/2021   Flow cytometry panel-leukemia/lymphoma work-up    Standing Status:   Future    Number of Occurrences:   1    Standing Expiration Date:   10/06/2021   Lactate dehydrogenase    Standing Status:   Future    Number of Occurrences:   1    Standing Expiration Date:   10/06/2021   Technologist smear review    Standing Status:   Future    Number of Occurrences:   1    Standing Expiration Date:   10/06/2021   Haptoglobin    Standing Status:   Future    Number of Occurrences:   1    Standing Expiration Date:   10/06/2021   CBC with Differential/Platelet    Standing Status:   Future    Standing Expiration Date:   10/06/2021    All questions were answered. The patient knows to call the clinic with any problems  questions or concerns.  cc Ottie Glazier, MD   Thank you for this kind referral and the opportunity to participate in the care of this patient. A copy of today's note is routed to referring provider    Earlie Server, MD, PhD Hematology Oncology [place] at [facility]  10/06/2020

## 2020-10-06 NOTE — Progress Notes (Signed)
Patient states concerns about hemoglobin and Iron Level has always been low. Patient states energy level has been low lately.

## 2020-10-07 ENCOUNTER — Other Ambulatory Visit: Payer: Self-pay

## 2020-10-07 DIAGNOSIS — D539 Nutritional anemia, unspecified: Secondary | ICD-10-CM

## 2020-10-07 LAB — MULTIPLE MYELOMA PANEL, SERUM
Albumin SerPl Elph-Mcnc: 3.7 g/dL (ref 2.9–4.4)
Albumin/Glob SerPl: 1.4 (ref 0.7–1.7)
Alpha 1: 0.2 g/dL (ref 0.0–0.4)
Alpha2 Glob SerPl Elph-Mcnc: 0.7 g/dL (ref 0.4–1.0)
B-Globulin SerPl Elph-Mcnc: 1 g/dL (ref 0.7–1.3)
Gamma Glob SerPl Elph-Mcnc: 0.8 g/dL (ref 0.4–1.8)
Globulin, Total: 2.7 g/dL (ref 2.2–3.9)
IgA: 123 mg/dL (ref 87–352)
IgG (Immunoglobin G), Serum: 798 mg/dL (ref 586–1602)
IgM (Immunoglobulin M), Srm: 108 mg/dL (ref 26–217)
Total Protein ELP: 6.4 g/dL (ref 6.0–8.5)

## 2020-10-07 LAB — CBC WITH DIFFERENTIAL/PLATELET
Abs Immature Granulocytes: 0.01 10*3/uL (ref 0.00–0.07)
Basophils Absolute: 0.1 10*3/uL (ref 0.0–0.1)
Basophils Relative: 1 %
Eosinophils Absolute: 0.4 10*3/uL (ref 0.0–0.5)
Eosinophils Relative: 7 %
HCT: 37.8 % (ref 36.0–46.0)
Hemoglobin: 12.5 g/dL (ref 12.0–15.0)
Immature Granulocytes: 0 %
Lymphocytes Relative: 38 %
Lymphs Abs: 2.2 10*3/uL (ref 0.7–4.0)
MCH: 32.4 pg (ref 26.0–34.0)
MCHC: 33.1 g/dL (ref 30.0–36.0)
MCV: 97.9 fL (ref 80.0–100.0)
Monocytes Absolute: 0.4 10*3/uL (ref 0.1–1.0)
Monocytes Relative: 7 %
Neutro Abs: 2.8 10*3/uL (ref 1.7–7.7)
Neutrophils Relative %: 47 %
Platelets: 330 10*3/uL (ref 150–400)
RBC: 3.86 MIL/uL — ABNORMAL LOW (ref 3.87–5.11)
RDW: 13.1 % (ref 11.5–15.5)
WBC: 5.9 10*3/uL (ref 4.0–10.5)
nRBC: 0 % (ref 0.0–0.2)

## 2020-10-07 LAB — KAPPA/LAMBDA LIGHT CHAINS
Kappa free light chain: 18.8 mg/L (ref 3.3–19.4)
Kappa, lambda light chain ratio: 1.06 (ref 0.26–1.65)
Lambda free light chains: 17.8 mg/L (ref 5.7–26.3)

## 2020-10-07 LAB — HAPTOGLOBIN: Haptoglobin: 112 mg/dL (ref 37–355)

## 2020-10-07 LAB — FOLATE: Folate: 48 ng/mL (ref >5.9)

## 2020-10-08 DIAGNOSIS — J189 Pneumonia, unspecified organism: Secondary | ICD-10-CM | POA: Diagnosis not present

## 2020-10-09 ENCOUNTER — Other Ambulatory Visit: Payer: Self-pay | Admitting: Dermatology

## 2020-10-09 DIAGNOSIS — L219 Seborrheic dermatitis, unspecified: Secondary | ICD-10-CM

## 2020-10-09 DIAGNOSIS — Z23 Encounter for immunization: Secondary | ICD-10-CM | POA: Diagnosis not present

## 2020-10-09 MED ORDER — MUPIROCIN 2 % EX OINT: 1.0000 "application " | TOPICAL_OINTMENT | Freq: Two times a day (BID) | CUTANEOUS | 2 refills | Status: DC

## 2020-10-09 MED ORDER — HYDROCORTISONE 2.5 % EX CREA: TOPICAL_CREAM | Freq: Two times a day (BID) | CUTANEOUS | 0 refills | Status: DC | PRN

## 2020-10-09 NOTE — Progress Notes (Signed)
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 Patient advised not to use hydrocortisone for more than one week. She has ketoconazole cream at home to also combine with these.

## 2020-10-13 ENCOUNTER — Encounter: Payer: Self-pay | Admitting: Oncology

## 2020-10-13 NOTE — Telephone Encounter (Signed)
Please schedule pt for MD follow up this week (if there is an opening), if not then next week is fine.

## 2020-10-15 DIAGNOSIS — J189 Pneumonia, unspecified organism: Secondary | ICD-10-CM | POA: Diagnosis not present

## 2020-10-21 ENCOUNTER — Other Ambulatory Visit: Payer: Self-pay | Admitting: *Deleted

## 2020-10-21 NOTE — Patient Outreach (Signed)
[facility]) Care Management  10/21/2020  Bethany Scott 07/03/1955 [id number]   Transition of care /Case Closure Unsuccessful outreach    Referral received:09/19/20 Initial outreach:09/22/20 Insurance: [place]   #4 Call Attempt  Unable to complete post hospital discharge transition of care assessment. No return call form patient after 3 call attempts and no response to request to contact RN Care Coordinator in unsuccessful outreach letter mailed to home on 09/22/20.  Objective: Per the electronic medical record, Bethany Scott was hospitalized at [facility] from  8/25-8/28/22 for Sepsis,Community acquired pneumonia, UTI, macrocytic Anemia hemoglobin 6.6 , 2 units prbc's and hemoglobin 8.2 on day of discharge  . Comorbidities include: GERD, Diabetes type 2, laparoscopic sleeve gastrectomy .She  was discharged to home on 09/19/20 without the need for home health services or durable medical equipment per the discharge summary.   Plan Case closed to [employer] unsuccessful outreach call attempts.    Joylene Draft, RN, BSN  [place] Management Coordinator  [phone]- Mobile [phone]- Toll Free Main Office

## 2020-10-24 ENCOUNTER — Other Ambulatory Visit: Payer: Self-pay | Admitting: Pulmonary Disease

## 2020-10-24 ENCOUNTER — Ambulatory Visit: Payer: 59 | Admitting: Oncology

## 2020-10-24 DIAGNOSIS — K219 Gastro-esophageal reflux disease without esophagitis: Secondary | ICD-10-CM

## 2020-10-27 ENCOUNTER — Encounter: Payer: Self-pay | Admitting: Oncology

## 2020-10-27 ENCOUNTER — Inpatient Hospital Stay: Payer: 59 | Attending: Oncology | Admitting: Oncology

## 2020-10-27 ENCOUNTER — Inpatient Hospital Stay: Payer: 59

## 2020-10-27 VITALS — BP 152/87 | HR 68 | Temp 98.3°F | Resp 18 | Wt 211.3 lb

## 2020-10-27 DIAGNOSIS — D539 Nutritional anemia, unspecified: Secondary | ICD-10-CM

## 2020-10-27 DIAGNOSIS — D75A Glucose-6-phosphate dehydrogenase (G6PD) deficiency without anemia: Secondary | ICD-10-CM | POA: Insufficient documentation

## 2020-10-27 DIAGNOSIS — Z9884 Bariatric surgery status: Secondary | ICD-10-CM | POA: Diagnosis not present

## 2020-10-27 NOTE — Progress Notes (Signed)
Hematology/Oncology Consult note [facility] Telephone:([phone] [phone]   Patient Care Team: Bethany Honour, MD as PCP - General (Family Medicine) Bethany Scott, Bethany Regal, MD (Obstetrics and Gynecology)  REFERRING PROVIDER: [name], MD  CHIEF COMPLAINTS/REASON FOR VISIT:  anemia  HISTORY OF PRESENTING ILLNESS:   Bethany Scott is a  65 y.o.  female with PMH listed below was seen in consultation at the request of  Bethany Honour, MD  for evaluation of anemia Patient has a history of gastric bypass.  She has a history of iron deficiency anemia and folic acid deficiency. Patient was recently admitted from 09/17/2020 - 09/21/2020 for pneumonia.  Chest x-ray was concerning for bilateral opacities/pulmonary vascular congestion.  CTA was negative for PE but showed extensive right-sided infiltrate and groundglass opacities.  Concerning for pneumonia.  Reports was complicated by asthma exacerbation.  Patient was treated with antibiotics.  She was also found to have macrocytic anemia, hemoglobin 6.6.  She received 2 units of PRBC and hemoglobin increased to 8.2 at discharge.  Patient's B12 and folate levels were adequae..  Patient reports feeling tired and fatigued.  Dozing off during football game yesterday.  Some shortness of breath with exertion as well Denies any bloody or black stool.  Denies abdominal pain, unintentional weight loss, night sweats or fever.   INTERVAL HISTORY Bethany Scott is a 65 y.o. female who has above history reviewed by me today presents for follow up visit for anemia. Problems and complaints are listed below: Patient had blood work done few weeks ago and presents to discuss results .  She has no new complaints.  Continues to feel fatigued.  Generalized aches and pains.  Review of Systems  Constitutional:  Positive for fatigue. Negative for appetite change, chills and fever.  HENT:   Negative for hearing loss and voice  change.   Eyes:  Negative for eye problems.  Respiratory:  Negative for chest tightness and cough.   Cardiovascular:  Negative for chest pain.  Gastrointestinal:  Negative for abdominal distention, abdominal pain and blood in stool.  Endocrine: Negative for hot flashes.  Genitourinary:  Negative for difficulty urinating and frequency.   Musculoskeletal:  Negative for arthralgias.       Chronic aches and pains  Skin:  Negative for itching and rash.  Neurological:  Negative for extremity weakness.  Hematological:  Negative for adenopathy.  Psychiatric/Behavioral:  Negative for confusion.    MEDICAL HISTORY:  Past Medical History:  Diagnosis Date   Allergy    Anemia    Anxiety    Arthritis    OA Knee; non-specific autoimmune process followed by Bethany Scott/Rheumatology.   Asthma    Blood transfusion without reported diagnosis YRS AGO   COVID-19 03/2018   Depression    Diabetes mellitus without complication (HCC)    Gastric ulcer, unspecified as acute or chronic, without mention of hemorrhage, perforation, or obstruction YRS AGO   GERD (gastroesophageal reflux disease)    Hyperlipidemia    Hypertension    Migraine    hx of migraines   Obesity, unspecified    Other chronic cystitis    Personal history of colonic polyps    PONV (postoperative nausea and vomiting)    NAUSEA, SCOPOLAMINE PATCH HELPED WITH LAST SURGERIES   Pre-diabetes     SURGICAL HISTORY: Past Surgical History:  Procedure Laterality Date   ABDOMINAL HYSTERECTOMY  01/25/1994   uterine fibroids; DUB; cervical dysplasia; ovaries resected two years later.  APPENDECTOMY     BILATERAL OOPHORECTOMY  2001   CESAREAN SECTION     x 2   COLON SURGERY  YRS AGO   SIGMOIDECTOMY   Colonoscopy  09/08/2012   diverticulosis, hemorrhoids.  [name].  Symptoms:  anemia, hemoccult +.   ESOPHAGOGASTRODUODENOSCOPY  09/08/2012   +HH.  [name].  Symptoms: anemia, hemoccult +.   HIP ARTHROSCOPY Right    LAPAROSCOPIC GASTRIC  SLEEVE RESECTION N/A 03/01/2016   Procedure: LAPAROSCOPIC GASTRIC SLEEVE RESECTION, UPPER ENDO;  Surgeon: Bethany Pickerel, MD;  Location: [facility];  Service: General;  Laterality: N/A;   LAPAROSCOPIC LYSIS OF ADHESIONS N/A 02/10/2016   Procedure: LAPAROSCOPIC LYSIS OF ADHESIONS;  Surgeon: Bethany Pickerel, MD;  Location: [facility];  Service: General;  Laterality: N/A;   LAPAROSCOPY N/A 02/10/2016   Procedure: LAPAROSCOPY DIAGNOSTIC;  Surgeon: Bethany Pickerel, MD;  Location: [facility];  Service: General;  Laterality: N/A;   NASAL SEPTUM SURGERY     NECK SURGERY     Rheumatology Consult  11/26/2011   multiple arthralgias. Autoimmune labs negative.  Rx for Plaquenil for non-specific autoimmune process.  Bethany Scott.   SIGMOID RESECTION / RECTOPEXY     SPINE SURGERY     TONSILLECTOMY AND ADENOIDECTOMY     TOTAL HIP ARTHROPLASTY Right 09/27/2017   Procedure: TOTAL HIP ARTHROPLASTY ANTERIOR APPROACH;  Surgeon: Bethany Knows, MD;  Location: [facility] OR[facility];  Service: Orthopedics;  Laterality: Right;   TOTAL HIP ARTHROPLASTY Left 12/27/2017   Procedure: TOTAL HIP ARTHROPLASTY ANTERIOR APPROACH;  Surgeon: Bethany Knows, MD;  Location: [facility] OR[facility];  Service: Orthopedics;  Laterality: Left;    SOCIAL HISTORY: Social History   Socioeconomic History   Marital status: Married    Spouse name: Not on file   Number of children: 2   Years of education: 12   Highest education level: Not on file  Occupational History   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      Children: 2 sons (24, 37); 2 grandchildren  (41yo Atlast, 37 yo August)      Lives: with husband, Bethany Scott/son.      Employment: check in at [employer] in [place] x 2004; loves job.      Tobacco abuse:  Former smoker.      Alcohol:  None; holidays only      Exercise: none in 2017     Seatbelt: 100%; no texting   Social Determinants of Health   Financial Resource Strain: Not on file  Food Insecurity: Not on file  Transportation Needs: Not on file  Physical Activity: Not on file  Stress: Not on file  Social Connections: Not on file  Intimate Partner Violence: Not on file    FAMILY HISTORY: Family History  Problem Relation Age of Onset   Bipolar  disorder Father    Transient ischemic attack Father    Dementia Father        secondary to head trauma   Hypertension Father    Diabetes Father    CAD Father    Cancer Mother 53       lung   Migraines Mother    Hypothyroidism Mother    Hypertension Sister    Atrial fibrillation Sister    Hypothyroidism Sister    Alcohol abuse Brother    Hypertension Brother    Alcohol abuse Brother    Atrial fibrillation Maternal Grandmother    COPD Maternal Grandmother    Diabetes Maternal Grandmother    Hyperlipidemia Maternal Grandmother    Diabetes Maternal Grandfather    Diabetes Paternal Grandfather    Heart disease Paternal Grandfather    Breast cancer Sister 5    ALLERGIES:  is allergic to cephalexin, cephalosporins, and penicillins.  MEDICATIONS:  Current Outpatient Medications  Medication Sig Dispense Refill   albuterol (VENTOLIN HFA) 108 (90 Base) MCG/ACT inhaler Inhale 1-2 puffs into the lungs every 6 (six) hours as needed for wheezing or shortness of breath. 1 each 0   azelastine (OPTIVAR) 0.05 % ophthalmic solution Place 1 drop into both eyes daily.     Azelastine-Fluticasone 137-50 MCG/ACT SUSP Place  2 sprays into the nose 2 (two) times a day. 23 g 0   budesonide-formoterol (SYMBICORT) 160-4.5 MCG/ACT inhaler INHALE 2 PUFFS BY MOUTH 2 TIMES DAILY 10.2 g 4   busPIRone (BUSPAR) 10 MG tablet Take 1 tablet (10 mg total) by mouth 3 (three) times daily 270 tablet 3   dexlansoprazole (DEXILANT) 60 MG capsule Take 1 capsule (60 mg total) by mouth once daily 90 capsule 1   ferrous [id number] C-folic acid (TRINSICON / FOLTRIN) capsule Take 1 capsule by mouth 2 (two) times daily after a meal. 120 capsule 1   gabapentin (NEURONTIN) 300 MG capsule TAKE 1 TO 3 CAPSULES BY MOUTH THREE TIMES DAILY 810 capsule 2   HYDROcodone-acetaminophen (NORCO) 10-325 MG tablet Take 1 tablet by mouth every 6 (six) hours as needed for moderate pain.     hydrocortisone 2.5 % cream Apply topically 2 (two) times daily as needed (Rash). At corners of mouth for up to one week. 20 g 0   levocetirizine (XYZAL) 5 MG tablet TAKE 1 TABLET BY MOUTH EVERY EVENING 90 tablet 3   methocarbamol (ROBAXIN) 750 MG tablet Take 750 mg by mouth 3 (three) times daily as needed for muscle spasms.     mupirocin ointment (BACTROBAN) 2 % Apply 1 application topically 2 (two) times daily. To corners of mouth 22 g 2   nystatin cream (MYCOSTATIN) Apply two to three times per day as needed 30 g 3   fluconazole (DIFLUCAN) 150 MG tablet Take one tablet as needed for yeast infection due to antibiotic use. Repeat in one week if needed. (Patient not taking: No sig reported) 2 tablet 3   predniSONE (DELTASONE) 50 MG tablet Take 1 tablet daily for next 3 days. (Patient not taking: Reported on 10/06/2020) 3 tablet 0   promethazine (PHENERGAN) 25 MG tablet TAKE 1 TABLET BY MOUTH EVERY 8 HOURS AS NEEDED FOR NAUSEA OR VOMITING 20 tablet 3   triamcinolone (KENALOG) 0.1 % paste Dry ulcer with tissue and then apply paste to affected area twice a day until no longer tender. (Patient not taking: Reported on 10/06/2020) 5 g 12   No current facility-administered  medications for this visit.  PHYSICAL EXAMINATION: ECOG PERFORMANCE STATUS: 1 - Symptomatic but completely ambulatory Vitals:   10/27/20 0850  BP: (!) 152/87  Pulse: 68  Resp: 18  Temp: 98.3 F (36.8 C)  SpO2: 98%   Filed Weights   10/27/20 0850  Weight: 211 lb 4.8 oz (95.8 kg)    Physical Exam Constitutional:      General: She is not in acute distress. HENT:     Head: Normocephalic and atraumatic.  Eyes:     General: No scleral icterus. Cardiovascular:     Rate and Rhythm: Normal rate and regular rhythm.     Heart sounds: Normal heart sounds.  Pulmonary:     Effort: Pulmonary effort is normal. No respiratory distress.     Breath sounds: No wheezing.  Abdominal:     General: Bowel sounds are normal. There is no distension.     Palpations: Abdomen is soft.  Musculoskeletal:        General: No deformity. Normal range of motion.     Cervical back: Normal range of motion and neck supple.  Skin:    General: Skin is warm and dry.     Findings: No erythema or rash.  Neurological:     Mental Status: She is alert and oriented to person, place, and time. Mental status is at baseline.     Cranial Nerves: No cranial nerve deficit.     Coordination: Coordination normal.  Psychiatric:        Mood and Affect: Mood normal.    LABORATORY DATA:  I have reviewed the data as listed Lab Results  Component Value Date   WBC 5.9 10/06/2020   HGB 12.5 10/06/2020   HCT 37.8 10/06/2020   MCV 97.9 10/06/2020   PLT 330 10/06/2020   Recent Labs    09/17/20 1349 09/18/20 0417 09/19/20 0337  NA 138 143 140  K 3.6 4.2 3.8  CL 107 110 106  CO2 _0 GLUCOSE 110* 135* 145*  BUN _1 CREATININE 0.81 0.66 0.58  CALCIUM 8.2* 8.9 8.4*  GFRNONAA >60 >60 >60  PROT 6.0*  --   --   ALBUMIN 3.7  --   --   AST 26  --   --   ALT 17  --   --   ALKPHOS 64  --   --   BILITOT 1.1  --   --     Iron/TIBC/Ferritin/ %Sat    Component Value Date/Time   IRON 51 10/06/2020  1602   IRON 92 04/30/2017 1055   TIBC 342 10/06/2020 1602   TIBC 264 04/27/2016 1744   FERRITIN 107 10/06/2020 1602   IRONPCTSAT 15 10/06/2020 1602   IRONPCTSAT 16 04/27/2016 1744   IRONPCTSAT 41 09/17/2013 0853       RADIOGRAPHIC STUDIES: I have personally reviewed the radiological images as listed and agreed with the findings in the report. No results found.    ASSESSMENT & PLAN:  1. Macrocytic anemia   2. History of gastric bypass    #Anemia, Labs are reviewed and discussed with patient. Hemoglobin has completely resolved. Patient has normal iron panel, normal reticulocyte hemoglobin. Negative multiple myeloma panel and light chain ratio. Flow cytometry was obtained results still pending.-LDH was normal.  Haptoglobin was low during recent admission and haptoglobin has completely normalized. I wonder that patient's acute anemia may be secondary to an acute hemolysis process secondary to infection.  Currently her count has completely normalized.  She had a G6PD  tested by pulmonology and the level was high.  I do not think she has an G6PD deficiency.  I will repeat a level.  History of gastric bypass, monitor counts.  She is at risk of vitamin deficiency.  Will monitor in the future.   Follow-up  3 months  Orders Placed This Encounter  Procedures   G-6-PD, Quant, Blood And RBC    Standing Status:   Future    Number of Occurrences:   1    Standing Expiration Date:   10/27/2021   Flow cytometry panel-leukemia/lymphoma work-up    Standing Status:   Future    Number of Occurrences:   1    Standing Expiration Date:   10/27/2021    All questions were answered. The patient Scott to call the clinic with any problems questions or concerns.  cc Bethany Honour, MD   Thank you for this kind referral and the opportunity to participate in the care of this patient. A copy of today's note is routed to referring provider    Earlie Server, MD, PhD Hematology Oncology [place] [facility]  10/27/2020

## 2020-10-27 NOTE — Progress Notes (Signed)
Pt here for lab review for anemia. Pt does feel weak and fatigued. No visible blood in stool.

## 2020-10-28 LAB — G-6-PD, QUANT, BLOOD AND RBC
G-6-PD, Quant: 375 U/10E12 RBC (ref 127–427)
RBC: 4.86 x10E6/uL (ref 3.77–5.28)

## 2020-11-01 LAB — COMP PANEL: LEUKEMIA/LYMPHOMA

## 2020-11-03 DIAGNOSIS — M7541 Impingement syndrome of right shoulder: Secondary | ICD-10-CM | POA: Diagnosis not present

## 2020-11-05 DIAGNOSIS — M5136 Other intervertebral disc degeneration, lumbar region: Secondary | ICD-10-CM | POA: Diagnosis not present

## 2020-11-05 DIAGNOSIS — D539 Nutritional anemia, unspecified: Secondary | ICD-10-CM | POA: Diagnosis not present

## 2020-11-05 DIAGNOSIS — E669 Obesity, unspecified: Secondary | ICD-10-CM | POA: Diagnosis not present

## 2020-11-05 DIAGNOSIS — J189 Pneumonia, unspecified organism: Secondary | ICD-10-CM | POA: Diagnosis not present

## 2020-11-05 DIAGNOSIS — Z23 Encounter for immunization: Secondary | ICD-10-CM | POA: Diagnosis not present

## 2020-11-05 DIAGNOSIS — E1169 Type 2 diabetes mellitus with other specified complication: Secondary | ICD-10-CM | POA: Diagnosis not present

## 2020-11-05 DIAGNOSIS — G894 Chronic pain syndrome: Secondary | ICD-10-CM | POA: Diagnosis not present

## 2020-11-07 ENCOUNTER — Other Ambulatory Visit: Payer: Self-pay

## 2020-11-07 ENCOUNTER — Ambulatory Visit
Admission: RE | Admit: 2020-11-07 | Discharge: 2020-11-07 | Disposition: A | Discharge status: Home / Self Care | Payer: 59 | Source: Ambulatory Visit | Attending: Pulmonary Disease | Admitting: Pulmonary Disease

## 2020-11-07 DIAGNOSIS — K219 Gastro-esophageal reflux disease without esophagitis: Secondary | ICD-10-CM | POA: Insufficient documentation

## 2020-11-07 DIAGNOSIS — K224 Dyskinesia of esophagus: Secondary | ICD-10-CM | POA: Diagnosis not present

## 2020-11-10 ENCOUNTER — Other Ambulatory Visit: Payer: Self-pay | Admitting: Family Medicine

## 2020-11-10 DIAGNOSIS — Z1231 Encounter for screening mammogram for malignant neoplasm of breast: Secondary | ICD-10-CM

## 2020-11-10 DIAGNOSIS — Z780 Asymptomatic menopausal state: Secondary | ICD-10-CM

## 2020-11-13 ENCOUNTER — Other Ambulatory Visit: Payer: Self-pay

## 2020-11-13 MED ORDER — KETOCONAZOLE 2 % EX CREA: 1.0000 "application " | TOPICAL_CREAM | Freq: Two times a day (BID) | CUTANEOUS | 11 refills | Status: AC

## 2020-11-13 MED ORDER — KETOCONAZOLE 2 % EX SHAM: MEDICATED_SHAMPOO | CUTANEOUS | 11 refills | Status: DC

## 2020-11-18 DIAGNOSIS — M25511 Pain in right shoulder: Secondary | ICD-10-CM | POA: Diagnosis not present

## 2020-11-21 DIAGNOSIS — D539 Nutritional anemia, unspecified: Secondary | ICD-10-CM | POA: Diagnosis not present

## 2020-11-21 DIAGNOSIS — R197 Diarrhea, unspecified: Secondary | ICD-10-CM | POA: Diagnosis not present

## 2020-11-27 ENCOUNTER — Other Ambulatory Visit (HOSPITAL_COMMUNITY): Payer: Self-pay

## 2020-11-27 MED ORDER — OMEPRAZOLE 40 MG PO CPDR
DELAYED_RELEASE_CAPSULE | ORAL | 11 refills | Status: DC
  Filled 2020-11-27: qty 60, 30d supply, fill #0

## 2020-12-02 ENCOUNTER — Other Ambulatory Visit (HOSPITAL_COMMUNITY): Payer: Self-pay

## 2020-12-02 DIAGNOSIS — M7541 Impingement syndrome of right shoulder: Secondary | ICD-10-CM | POA: Diagnosis not present

## 2020-12-04 ENCOUNTER — Other Ambulatory Visit (HOSPITAL_COMMUNITY): Payer: Self-pay

## 2020-12-05 ENCOUNTER — Other Ambulatory Visit (HOSPITAL_COMMUNITY): Payer: Self-pay

## 2020-12-05 DIAGNOSIS — M6281 Muscle weakness (generalized): Secondary | ICD-10-CM | POA: Diagnosis not present

## 2020-12-05 DIAGNOSIS — S8265XD Nondisplaced fracture of lateral malleolus of left fibula, subsequent encounter for closed fracture with routine healing: Secondary | ICD-10-CM | POA: Diagnosis not present

## 2020-12-05 DIAGNOSIS — M542 Cervicalgia: Secondary | ICD-10-CM | POA: Diagnosis not present

## 2020-12-05 DIAGNOSIS — M25472 Effusion, left ankle: Secondary | ICD-10-CM | POA: Diagnosis not present

## 2020-12-05 DIAGNOSIS — S93402D Sprain of unspecified ligament of left ankle, subsequent encounter: Secondary | ICD-10-CM | POA: Diagnosis not present

## 2020-12-05 DIAGNOSIS — M5412 Radiculopathy, cervical region: Secondary | ICD-10-CM | POA: Diagnosis not present

## 2020-12-05 DIAGNOSIS — M79672 Pain in left foot: Secondary | ICD-10-CM | POA: Diagnosis not present

## 2020-12-05 DIAGNOSIS — M25511 Pain in right shoulder: Secondary | ICD-10-CM | POA: Diagnosis not present

## 2020-12-05 DIAGNOSIS — S92355D Nondisplaced fracture of fifth metatarsal bone, left foot, subsequent encounter for fracture with routine healing: Secondary | ICD-10-CM | POA: Diagnosis not present

## 2020-12-06 ENCOUNTER — Other Ambulatory Visit (HOSPITAL_COMMUNITY): Payer: Self-pay

## 2020-12-08 ENCOUNTER — Other Ambulatory Visit (HOSPITAL_COMMUNITY): Payer: Self-pay

## 2020-12-10 ENCOUNTER — Other Ambulatory Visit: Payer: Self-pay | Admitting: Podiatry

## 2020-12-10 DIAGNOSIS — M79672 Pain in left foot: Secondary | ICD-10-CM

## 2020-12-10 DIAGNOSIS — S93402D Sprain of unspecified ligament of left ankle, subsequent encounter: Secondary | ICD-10-CM

## 2020-12-11 ENCOUNTER — Other Ambulatory Visit (HOSPITAL_COMMUNITY): Payer: Self-pay

## 2020-12-12 ENCOUNTER — Other Ambulatory Visit (HOSPITAL_COMMUNITY): Payer: Self-pay

## 2020-12-15 ENCOUNTER — Other Ambulatory Visit (HOSPITAL_COMMUNITY): Payer: Self-pay

## 2020-12-16 ENCOUNTER — Other Ambulatory Visit (HOSPITAL_COMMUNITY): Payer: Self-pay

## 2020-12-17 ENCOUNTER — Other Ambulatory Visit (HOSPITAL_COMMUNITY): Payer: Self-pay

## 2020-12-22 ENCOUNTER — Other Ambulatory Visit (HOSPITAL_COMMUNITY): Payer: Self-pay

## 2020-12-22 ENCOUNTER — Other Ambulatory Visit: Payer: Self-pay

## 2020-12-22 ENCOUNTER — Ambulatory Visit
Admission: RE | Admit: 2020-12-22 | Discharge: 2020-12-22 | Disposition: A | Discharge status: Home / Self Care | Payer: 59 | Source: Ambulatory Visit | Attending: Podiatry | Admitting: Podiatry

## 2020-12-22 ENCOUNTER — Ambulatory Visit: Admission: RE | Admit: 2020-12-22 | Payer: 59 | Source: Ambulatory Visit

## 2020-12-22 DIAGNOSIS — S93402D Sprain of unspecified ligament of left ankle, subsequent encounter: Secondary | ICD-10-CM | POA: Diagnosis not present

## 2020-12-22 DIAGNOSIS — M19072 Primary osteoarthritis, left ankle and foot: Secondary | ICD-10-CM | POA: Diagnosis not present

## 2020-12-22 DIAGNOSIS — M79672 Pain in left foot: Secondary | ICD-10-CM | POA: Diagnosis not present

## 2020-12-23 ENCOUNTER — Other Ambulatory Visit (HOSPITAL_COMMUNITY): Payer: Self-pay

## 2020-12-23 MED FILL — Budesonide-Formoterol Fumarate Dihyd Aerosol 160-4.5 MCG/ACT: RESPIRATORY_TRACT | 30 days supply | Qty: 10.2 | Fill #2 | Status: AC

## 2020-12-24 DIAGNOSIS — M75121 Complete rotator cuff tear or rupture of right shoulder, not specified as traumatic: Secondary | ICD-10-CM | POA: Diagnosis not present

## 2020-12-25 ENCOUNTER — Other Ambulatory Visit (HOSPITAL_COMMUNITY): Payer: Self-pay

## 2020-12-25 MED ORDER — PROMETHAZINE HCL 25 MG PO TABS
ORAL_TABLET | ORAL | 1 refills | Status: DC
  Filled 2020-12-25: qty 20, 6d supply, fill #0

## 2020-12-25 MED ORDER — BUSPIRONE HCL 10 MG PO TABS
10.0000 mg | ORAL_TABLET | Freq: Three times a day (TID) | ORAL | 1 refills | Status: DC
  Filled 2020-12-25: qty 270, 90d supply, fill #0
  Filled 2021-04-03: qty 270, 90d supply, fill #1

## 2020-12-25 MED ORDER — MONTELUKAST SODIUM 10 MG PO TABS
10.0000 mg | ORAL_TABLET | Freq: Every day | ORAL | 1 refills | Status: DC
  Filled 2020-12-25: qty 90, 90d supply, fill #0
  Filled 2021-04-03: qty 90, 90d supply, fill #1

## 2020-12-25 MED ORDER — NITROFURANTOIN MONOHYD MACRO 100 MG PO CAPS
ORAL_CAPSULE | ORAL | 1 refills | Status: DC
  Filled 2020-12-25: qty 90, 90d supply, fill #0

## 2020-12-25 MED ORDER — DULOXETINE HCL 60 MG PO CPEP
ORAL_CAPSULE | ORAL | 1 refills | Status: DC
  Filled 2020-12-25: qty 180, 90d supply, fill #0
  Filled 2021-04-03: qty 180, 90d supply, fill #1

## 2020-12-26 ENCOUNTER — Other Ambulatory Visit (HOSPITAL_COMMUNITY): Payer: Self-pay

## 2021-01-01 ENCOUNTER — Telehealth: Payer: 59 | Admitting: Physician Assistant

## 2021-01-01 DIAGNOSIS — U071 COVID-19: Secondary | ICD-10-CM

## 2021-01-01 MED ORDER — IPRATROPIUM BROMIDE 0.03 % NA SOLN: 2.0000 | Freq: Two times a day (BID) | NASAL | 0 refills | Status: DC

## 2021-01-01 MED ORDER — BENZONATATE 100 MG PO CAPS: 100.0000 mg | ORAL_CAPSULE | Freq: Three times a day (TID) | ORAL | 0 refills | Status: DC | PRN

## 2021-01-01 MED ORDER — PROMETHAZINE-DM 6.25-15 MG/5ML PO SYRP: 5.0000 mL | ORAL_SOLUTION | Freq: Four times a day (QID) | ORAL | 0 refills | Status: DC | PRN

## 2021-01-01 NOTE — Progress Notes (Signed)
E-Visit  for Positive Covid Test Result  We are sorry you are not feeling well. We are here to help!  You have tested positive for COVID-19, meaning that you were infected with the novel coronavirus and could give the virus to others.  It is vitally important that you stay home so you do not spread it to others.      Please continue isolation at home, for at least 10 days since the start of your symptoms and until you have had 24 hours with no fever (without taking a fever reducer) and with improving of symptoms.  If you have no symptoms but tested positive (or all symptoms resolve after 5 days and you have no fever) you can leave your house but continue to wear a mask around others for an additional 5 days. If you have a fever,continue to stay home until you have had 24 hours of no fever. Most cases improve 5-10 days from onset but we have seen a small number of patients who have gotten worse after the 10 days.  Please be sure to watch for worsening symptoms and remain taking the proper precautions.   Go to the nearest hospital ED for assessment if fever/cough/breathlessness are severe or illness seems like a threat to life.    The following symptoms may appear 2-14 days after exposure: Fever Cough Shortness of breath or difficulty breathing Chills Repeated shaking with chills Muscle pain Headache Sore throat New loss of taste or smell Fatigue Congestion or runny nose Nausea or vomiting Diarrhea  You have been enrolled in [place] for COVID-19. Daily you will receive a questionnaire within the [place] website. Our COVID-19 response team will be monitoring your responses daily.  You can use medication such as prescription cough medication called Tessalon Perles 100 mg. You may take 1-2 capsules every 8 hours as needed for cough and prescription cough medication called Phenergan DM 6.25 mg/15 mg. You make take one teaspoon / 5 ml every 4-6 hours as needed for cough and  ipratropium nasal spray 1-2 sprays each nostril every 12 hours as needed  You may also take acetaminophen (Tylenol) as needed for fever.  HOME CARE: Only take medications as instructed by your medical team. Drink plenty of fluids and get plenty of rest. A steam or ultrasonic humidifier can help if you have congestion.   GET HELP RIGHT AWAY IF YOU HAVE EMERGENCY WARNING SIGNS.  Call 911 or proceed to your closest emergency facility if: You develop worsening high fever. Trouble breathing Bluish lips or face Persistent pain or pressure in the chest New confusion Inability to wake or stay awake You cough up blood. Your symptoms become more severe Inability to hold down food or fluids  This list is not all possible symptoms. Contact your medical provider for any symptoms that are severe or concerning to you.    Your e-visit answers were reviewed by a board certified advanced clinical practitioner to complete your personal care plan.  Depending on the condition, your plan could have included both over the counter or prescription medications.  If there is a problem please reply once you have received a response from your provider.  Your safety is important to [place].  If you have drug allergies check your prescription carefully.    You can use MyChart to ask questions about today's visit, request a non-urgent call back, or ask for a work or school excuse for 24 hours related to this e-Visit. If it  has been greater than 24 hours you will need to follow up with your provider, or enter a new e-Visit to address those concerns. You will get an e-mail in the next two days asking about your experience.  I hope that your e-visit has been valuable and will speed your recovery. Thank you for using e-visits.  I provided 5 minutes of non face-to-face time during this encounter for chart review and documentation.

## 2021-01-06 ENCOUNTER — Other Ambulatory Visit
Admission: RE | Admit: 2021-01-06 | Discharge: 2021-01-06 | Disposition: A | Discharge status: Home / Self Care | Payer: 59 | Source: Ambulatory Visit | Attending: Orthopedic Surgery | Admitting: Orthopedic Surgery

## 2021-01-06 ENCOUNTER — Other Ambulatory Visit (HOSPITAL_COMMUNITY): Payer: Self-pay

## 2021-01-06 ENCOUNTER — Other Ambulatory Visit: Payer: Self-pay | Admitting: Orthopedic Surgery

## 2021-01-06 ENCOUNTER — Other Ambulatory Visit: Payer: Self-pay

## 2021-01-06 HISTORY — DX: Pneumonia, unspecified organism: J18.9

## 2021-01-06 NOTE — Patient Instructions (Signed)
Your procedure is scheduled on: 01/13/21 - Tuesday Report to the Registration Desk on the 1st floor of the [facility]. To find out your arrival time, please call [phone] between 1PM - 3PM on: 01/12/21 - Monday  REMEMBER: Instructions that are not followed completely may result in serious medical risk, up to and including death; or upon the discretion of your surgeon and anesthesiologist your surgery may need to be rescheduled.  Do not eat food after midnight the night before surgery.  No gum chewing, lozengers or hard candies.  You may however, drink CLEAR liquids up to 2 hours before you are scheduled to arrive for your surgery. Do not drink anything within 2 hours of your scheduled arrival time.  Clear liquids include: - water  - apple juice without pulp - gatorade (not RED, PURPLE, OR BLUE) - black coffee or tea (Do NOT add milk or creamers to the coffee or tea) Do NOT drink anything that is not on this list.  TAKE THESE MEDICATIONS THE MORNING OF SURGERY WITH A SIP OF WATER:  - Azelastine-Fluticasone 137-50 MCG/ACT SUSP - SYMBICORT 160-4.5 MCG/ACT inhaler - dexlansoprazole (DEXILANT) 60 MG capsule - DULoxetine (CYMBALTA) 60 MG capsule - gabapentin (NEURONTIN) 300 MG capsule as directed - montelukast (SINGULAIR) 10 MG tablet  One week prior to surgery: Stop Anti-inflammatories (NSAIDS) such as Advil, Aleve, Ibuprofen, Motrin, Naproxen, Naprosyn and Aspirin based products such as Excedrin, Goodys Powder, BC Powder  Stop ANY OVER THE COUNTER supplements until after surgery.  You may however, continue to take Tylenol if needed for pain up until the day of surgery.  No Alcohol for 24 hours before or after surgery.  No Smoking including e-cigarettes for 24 hours prior to surgery.  No chewable tobacco products for at least 6 hours prior to surgery.  No nicotine patches on the day of surgery.  Do not use any "recreational" drugs for at least a week prior to your surgery.   Please be advised that the combination of cocaine and anesthesia may have negative outcomes, up to and including death. If you test positive for cocaine, your surgery will be cancelled.  On the morning of surgery brush your teeth with toothpaste and water, you may rinse your mouth with mouthwash if you wish. Do not swallow any toothpaste or mouthwash.  Use CHG Soap or wipes as directed on instruction sheet.  Do not wear jewelry, make-up, hairpins, clips or nail polish.  Do not wear lotions, powders, or perfumes.   Do not shave body from the neck down 48 hours prior to surgery just in case you cut yourself which could leave a site for infection.  Also, freshly shaved skin may become irritated if using the CHG soap.  Contact lenses, hearing aids and dentures may not be worn into surgery.  Do not bring valuables to the hospital. [facility] is not responsible for any missing/lost belongings or valuables.   Notify your doctor if there is any change in your medical condition (cold, fever, infection).  Wear comfortable clothing (specific to your surgery type) to the hospital.  After surgery, you can help prevent lung complications by doing breathing exercises.  Take deep breaths and cough every 1-2 hours. Your doctor may order a device called an Incentive Spirometer to help you take deep breaths. When coughing or sneezing, hold a pillow firmly against your incision with both hands. This is called splinting. Doing this helps protect your incision. It also decreases belly discomfort.  If you are  being admitted to the hospital overnight, leave your suitcase in the car. After surgery it may be brought to your room.  If you are being discharged the day of surgery, you will not be allowed to drive home. You will need a responsible adult (18 years or older) to drive you home and stay with you that night.   If you are taking public transportation, you will need to have a responsible adult (18  years or older) with you. Please confirm with your physician that it is acceptable to use public transportation.   Please call the [facility] Dept. at [phone] if you have any questions about these instructions.  Surgery Visitation Policy:  Patients undergoing a surgery or procedure may have one family member or support person with them as long as that person is not COVID-19 positive or experiencing its symptoms.  That person may remain in the waiting area during the procedure and may rotate out with other people.  Inpatient Visitation:    Visiting hours are 7 a.m. to 8 p.m. Up to two visitors ages 16+ are allowed at one time in a patient room. The visitors may rotate out with other people during the day. Visitors must check out when they leave, or other visitors will not be allowed. One designated support person may remain overnight. The visitor must pass COVID-19 screenings, use hand sanitizer when entering and exiting the patients room and wear a mask at all times, including in the patients room. Patients must also wear a mask when staff or their visitor are in the room. Masking is required regardless of vaccination status.

## 2021-01-07 ENCOUNTER — Other Ambulatory Visit: Payer: Self-pay

## 2021-01-07 MED ORDER — HYDROCODONE-ACETAMINOPHEN 10-325 MG PO TABS
ORAL_TABLET | ORAL | 0 refills | Status: DC
  Filled 2021-01-07: qty 150, 25d supply, fill #0

## 2021-01-08 ENCOUNTER — Encounter: Payer: Self-pay | Admitting: Oncology

## 2021-01-08 ENCOUNTER — Other Ambulatory Visit: Payer: Self-pay

## 2021-01-08 ENCOUNTER — Other Ambulatory Visit
Admission: RE | Admit: 2021-01-08 | Discharge: 2021-01-08 | Disposition: A | Discharge status: Home / Self Care | Payer: 59 | Source: Ambulatory Visit | Attending: Oncology | Admitting: Oncology

## 2021-01-08 ENCOUNTER — Inpatient Hospital Stay: Payer: 59

## 2021-01-08 DIAGNOSIS — D539 Nutritional anemia, unspecified: Secondary | ICD-10-CM | POA: Diagnosis not present

## 2021-01-08 LAB — BASIC METABOLIC PANEL
Anion gap: 9 (ref 5–15)
BUN: 18 mg/dL (ref 8–23)
CO2: 23 mmol/L (ref 22–32)
Calcium: 9.5 mg/dL (ref 8.9–10.3)
Chloride: 104 mmol/L (ref 98–111)
Creatinine, Ser: 1.51 mg/dL — ABNORMAL HIGH (ref 0.44–1.00)
GFR, Estimated: 38 mL/min — ABNORMAL LOW (ref >60)
Glucose, Bld: 144 mg/dL — ABNORMAL HIGH (ref 70–99)
Potassium: 3.9 mmol/L (ref 3.5–5.1)
Sodium: 136 mmol/L (ref 135–145)

## 2021-01-08 LAB — LACTATE DEHYDROGENASE: LDH: 181 U/L (ref 98–192)

## 2021-01-08 LAB — HEPATIC FUNCTION PANEL
ALT: 10 U/L (ref 0–44)
AST: 16 U/L (ref 15–41)
Albumin: 4.8 g/dL (ref 3.5–5.0)
Alkaline Phosphatase: 57 U/L (ref 38–126)
Bilirubin, Direct: <0.1 mg/dL (ref 0.0–0.2)
Total Bilirubin: 1 mg/dL (ref 0.3–1.2)
Total Protein: 6.8 g/dL (ref 6.5–8.1)

## 2021-01-08 LAB — CBC WITH DIFFERENTIAL/PLATELET
Abs Immature Granulocytes: 0.06 10*3/uL (ref 0.00–0.07)
Basophils Absolute: 0 10*3/uL (ref 0.0–0.1)
Basophils Relative: 1 %
Eosinophils Absolute: 0.3 10*3/uL (ref 0.0–0.5)
Eosinophils Relative: 3 %
HCT: 32 % — ABNORMAL LOW (ref 36.0–46.0)
Hemoglobin: 10.2 g/dL — ABNORMAL LOW (ref 12.0–15.0)
Immature Granulocytes: 1 %
Lymphocytes Relative: 13 %
Lymphs Abs: 1.1 10*3/uL (ref 0.7–4.0)
MCH: 31.6 pg (ref 26.0–34.0)
MCHC: 31.9 g/dL (ref 30.0–36.0)
MCV: 99.1 fL (ref 80.0–100.0)
Monocytes Absolute: 0.6 10*3/uL (ref 0.1–1.0)
Monocytes Relative: 7 %
Neutro Abs: 6.5 10*3/uL (ref 1.7–7.7)
Neutrophils Relative %: 75 %
Platelets: 337 10*3/uL (ref 150–400)
RBC: 3.23 MIL/uL — ABNORMAL LOW (ref 3.87–5.11)
RDW: 14.2 % (ref 11.5–15.5)
WBC: 8.6 10*3/uL (ref 4.0–10.5)
nRBC: 0 % (ref 0.0–0.2)

## 2021-01-08 LAB — IRON AND TIBC
Iron: 43 ug/dL (ref 28–170)
Saturation Ratios: 14 % (ref 10.4–31.8)
TIBC: 312 ug/dL (ref 250–450)
UIBC: 269 ug/dL

## 2021-01-08 LAB — RETIC PANEL
Immature Retic Fract: 23.9 % — ABNORMAL HIGH (ref 2.3–15.9)
RBC.: 3.21 MIL/uL — ABNORMAL LOW (ref 3.87–5.11)
Retic Count, Absolute: 197.7 10*3/uL — ABNORMAL HIGH (ref 19.0–186.0)
Retic Ct Pct: 6.2 % — ABNORMAL HIGH (ref 0.4–3.1)
Reticulocyte Hemoglobin: 33.8 pg (ref >27.9)

## 2021-01-08 LAB — FERRITIN: Ferritin: 286 ng/mL (ref 11–307)

## 2021-01-08 NOTE — Telephone Encounter (Signed)
Please advise 

## 2021-01-09 DIAGNOSIS — S92355D Nondisplaced fracture of fifth metatarsal bone, left foot, subsequent encounter for fracture with routine healing: Secondary | ICD-10-CM | POA: Diagnosis not present

## 2021-01-09 DIAGNOSIS — M25372 Other instability, left ankle: Secondary | ICD-10-CM | POA: Diagnosis not present

## 2021-01-09 DIAGNOSIS — S93402D Sprain of unspecified ligament of left ankle, subsequent encounter: Secondary | ICD-10-CM | POA: Diagnosis not present

## 2021-01-09 DIAGNOSIS — M25472 Effusion, left ankle: Secondary | ICD-10-CM | POA: Diagnosis not present

## 2021-01-09 LAB — HAPTOGLOBIN: Haptoglobin: 41 mg/dL (ref 37–355)

## 2021-01-13 ENCOUNTER — Other Ambulatory Visit: Payer: Self-pay

## 2021-01-13 ENCOUNTER — Ambulatory Visit: Payer: 59 | Admitting: Urgent Care

## 2021-01-13 ENCOUNTER — Ambulatory Visit
Admission: RE | Admit: 2021-01-13 | Discharge: 2021-01-13 | Disposition: A | Discharge status: Home / Self Care | Payer: 59 | Source: Ambulatory Visit | Attending: Orthopedic Surgery | Admitting: Orthopedic Surgery

## 2021-01-13 ENCOUNTER — Encounter: Payer: Self-pay | Admitting: Orthopedic Surgery

## 2021-01-13 ENCOUNTER — Encounter
Admission: RE | Disposition: A | Discharge status: Home / Self Care | Payer: Self-pay | Source: Ambulatory Visit | Attending: Orthopedic Surgery

## 2021-01-13 ENCOUNTER — Ambulatory Visit: Payer: 59

## 2021-01-13 ENCOUNTER — Ambulatory Visit: Payer: 59 | Admitting: Anesthesiology

## 2021-01-13 DIAGNOSIS — M19011 Primary osteoarthritis, right shoulder: Secondary | ICD-10-CM | POA: Insufficient documentation

## 2021-01-13 DIAGNOSIS — I10 Essential (primary) hypertension: Secondary | ICD-10-CM | POA: Insufficient documentation

## 2021-01-13 DIAGNOSIS — M25811 Other specified joint disorders, right shoulder: Secondary | ICD-10-CM | POA: Diagnosis not present

## 2021-01-13 DIAGNOSIS — S46111A Strain of muscle, fascia and tendon of long head of biceps, right arm, initial encounter: Secondary | ICD-10-CM | POA: Insufficient documentation

## 2021-01-13 DIAGNOSIS — X58XXXA Exposure to other specified factors, initial encounter: Secondary | ICD-10-CM | POA: Diagnosis not present

## 2021-01-13 DIAGNOSIS — K219 Gastro-esophageal reflux disease without esophagitis: Secondary | ICD-10-CM | POA: Diagnosis not present

## 2021-01-13 DIAGNOSIS — Z87891 Personal history of nicotine dependence: Secondary | ICD-10-CM | POA: Diagnosis not present

## 2021-01-13 DIAGNOSIS — Z6832 Body mass index (BMI) 32.0-32.9, adult: Secondary | ICD-10-CM | POA: Diagnosis not present

## 2021-01-13 DIAGNOSIS — M75121 Complete rotator cuff tear or rupture of right shoulder, not specified as traumatic: Secondary | ICD-10-CM | POA: Insufficient documentation

## 2021-01-13 DIAGNOSIS — J45909 Unspecified asthma, uncomplicated: Secondary | ICD-10-CM | POA: Diagnosis not present

## 2021-01-13 DIAGNOSIS — M25511 Pain in right shoulder: Secondary | ICD-10-CM | POA: Diagnosis not present

## 2021-01-13 DIAGNOSIS — E785 Hyperlipidemia, unspecified: Secondary | ICD-10-CM | POA: Diagnosis not present

## 2021-01-13 DIAGNOSIS — M13811 Other specified arthritis, right shoulder: Secondary | ICD-10-CM | POA: Diagnosis not present

## 2021-01-13 DIAGNOSIS — Z419 Encounter for procedure for purposes other than remedying health state, unspecified: Secondary | ICD-10-CM

## 2021-01-13 DIAGNOSIS — E669 Obesity, unspecified: Secondary | ICD-10-CM | POA: Diagnosis not present

## 2021-01-13 DIAGNOSIS — M7541 Impingement syndrome of right shoulder: Secondary | ICD-10-CM | POA: Diagnosis not present

## 2021-01-13 DIAGNOSIS — M75101 Unspecified rotator cuff tear or rupture of right shoulder, not specified as traumatic: Secondary | ICD-10-CM | POA: Diagnosis not present

## 2021-01-13 DIAGNOSIS — M66811 Spontaneous rupture of other tendons, right shoulder: Secondary | ICD-10-CM | POA: Diagnosis not present

## 2021-01-13 DIAGNOSIS — G8918 Other acute postprocedural pain: Secondary | ICD-10-CM | POA: Diagnosis not present

## 2021-01-13 HISTORY — PX: SHOULDER ARTHROSCOPY WITH SUBACROMIAL DECOMPRESSION: SHX5684

## 2021-01-13 HISTORY — PX: SHOULDER ARTHROSCOPY WITH OPEN ROTATOR CUFF REPAIR AND DISTAL CLAVICLE ACROMINECTOMY: SHX5683

## 2021-01-13 SURGERY — SHOULDER ARTHROSCOPY WITH OPEN ROTATOR CUFF REPAIR AND DISTAL CLAVICLE ACROMINECTOMY
Anesthesia: Regional | Site: Shoulder | Laterality: Right

## 2021-01-13 MED ORDER — ONDANSETRON HCL 4 MG PO TABS: 4.0000 mg | ORAL_TABLET | Freq: Three times a day (TID) | ORAL | 0 refills | Status: DC | PRN

## 2021-01-13 MED ORDER — BUPIVACAINE LIPOSOME 1.3 % IJ SUSP
INTRAMUSCULAR | Status: DC | PRN
  Administered 2021-01-13: 20 mL

## 2021-01-13 MED ORDER — MIDAZOLAM HCL 2 MG/2ML IJ SOLN
INTRAMUSCULAR | Status: AC
  Administered 2021-01-13: 09:00:00 1 mg via INTRAVENOUS
  Filled 2021-01-13: qty 2

## 2021-01-13 MED ORDER — CLINDAMYCIN PHOSPHATE 600 MG/50ML IV SOLN
600.0000 mg | INTRAVENOUS | Status: AC
  Administered 2021-01-13: 11:00:00 600 mg via INTRAVENOUS

## 2021-01-13 MED ORDER — FAMOTIDINE 20 MG PO TABS
ORAL_TABLET | ORAL | Status: AC
  Administered 2021-01-13: 08:00:00 20 mg via ORAL
  Filled 2021-01-13: qty 1

## 2021-01-13 MED ORDER — ONDANSETRON HCL 4 MG/2ML IJ SOLN
INTRAMUSCULAR | Status: DC | PRN
  Administered 2021-01-13: 4 mg via INTRAVENOUS

## 2021-01-13 MED ORDER — OXYCODONE HCL 5 MG/5ML PO SOLN
5.0000 mg | Freq: Once | ORAL | Status: AC | PRN
  Administered 2021-01-13: 14:00:00 5 mg via ORAL

## 2021-01-13 MED ORDER — LACTATED RINGERS IR SOLN
Status: DC | PRN
  Administered 2021-01-13 (×4): 3000 mL

## 2021-01-13 MED ORDER — APREPITANT 40 MG PO CAPS
ORAL_CAPSULE | ORAL | Status: AC
  Administered 2021-01-13: 08:00:00 40 mg via ORAL
  Filled 2021-01-13: qty 1

## 2021-01-13 MED ORDER — CLINDAMYCIN PHOSPHATE 600 MG/50ML IV SOLN
INTRAVENOUS | Status: AC
  Filled 2021-01-13: qty 50

## 2021-01-13 MED ORDER — ACETAMINOPHEN 500 MG PO TABS: 1000.0000 mg | ORAL_TABLET | ORAL | Status: AC

## 2021-01-13 MED ORDER — PHENYLEPHRINE HCL (PRESSORS) 10 MG/ML IV SOLN
INTRAVENOUS | Status: DC | PRN
  Administered 2021-01-13 (×2): 80 ug via INTRAVENOUS

## 2021-01-13 MED ORDER — SEVOFLURANE IN SOLN
RESPIRATORY_TRACT | Status: AC
  Filled 2021-01-13: qty 250

## 2021-01-13 MED ORDER — DEXAMETHASONE SODIUM PHOSPHATE 10 MG/ML IJ SOLN
INTRAMUSCULAR | Status: DC | PRN
  Administered 2021-01-13: 10 mg via INTRAVENOUS

## 2021-01-13 MED ORDER — OXYCODONE HCL 5 MG PO TABS: 5.0000 mg | ORAL_TABLET | Freq: Once | ORAL | Status: AC | PRN

## 2021-01-13 MED ORDER — ORAL CARE MOUTH RINSE: 15.0000 mL | Freq: Once | OROMUCOSAL | Status: AC

## 2021-01-13 MED ORDER — ACETAMINOPHEN 500 MG PO TABS
ORAL_TABLET | ORAL | Status: AC
  Administered 2021-01-13: 08:00:00 1000 mg via ORAL
  Filled 2021-01-13: qty 2

## 2021-01-13 MED ORDER — FENTANYL CITRATE (PF) 100 MCG/2ML IJ SOLN
INTRAMUSCULAR | Status: AC
  Filled 2021-01-13: qty 2

## 2021-01-13 MED ORDER — CHLORHEXIDINE GLUCONATE 0.12 % MT SOLN: 15.0000 mL | Freq: Once | OROMUCOSAL | Status: AC

## 2021-01-13 MED ORDER — LACTATED RINGERS IV SOLN: INTRAVENOUS | Status: DC

## 2021-01-13 MED ORDER — FAMOTIDINE 20 MG PO TABS: 20.0000 mg | ORAL_TABLET | Freq: Once | ORAL | Status: AC

## 2021-01-13 MED ORDER — FENTANYL CITRATE (PF) 100 MCG/2ML IJ SOLN
INTRAMUSCULAR | Status: DC | PRN
  Administered 2021-01-13: 50 ug via INTRAVENOUS

## 2021-01-13 MED ORDER — CHLORHEXIDINE GLUCONATE 0.12 % MT SOLN
OROMUCOSAL | Status: AC
  Administered 2021-01-13: 08:00:00 15 mL via OROMUCOSAL
  Filled 2021-01-13: qty 15

## 2021-01-13 MED ORDER — FENTANYL CITRATE (PF) 100 MCG/2ML IJ SOLN
INTRAMUSCULAR | Status: AC
  Administered 2021-01-13: 14:00:00 50 ug via INTRAVENOUS
  Filled 2021-01-13: qty 2

## 2021-01-13 MED ORDER — OXYCODONE HCL 5 MG PO TABS
ORAL_TABLET | ORAL | Status: AC
  Filled 2021-01-13: qty 1

## 2021-01-13 MED ORDER — SCOPOLAMINE 1 MG/3DAYS TD PT72: 1.0000 | MEDICATED_PATCH | TRANSDERMAL | Status: DC

## 2021-01-13 MED ORDER — OXYCODONE HCL 5 MG PO TABS: 5.0000 mg | ORAL_TABLET | ORAL | 0 refills | Status: DC | PRN

## 2021-01-13 MED ORDER — BUPIVACAINE HCL (PF) 0.5 % IJ SOLN
INTRAMUSCULAR | Status: DC | PRN
  Administered 2021-01-13: 10 mL

## 2021-01-13 MED ORDER — EPINEPHRINE PF 1 MG/ML IJ SOLN
INTRAMUSCULAR | Status: AC
  Filled 2021-01-13: qty 4

## 2021-01-13 MED ORDER — ONDANSETRON HCL 4 MG/2ML IJ SOLN: 4.0000 mg | Freq: Once | INTRAMUSCULAR | Status: DC | PRN

## 2021-01-13 MED ORDER — CHLORHEXIDINE GLUCONATE CLOTH 2 % EX PADS: 6.0000 | MEDICATED_PAD | Freq: Once | CUTANEOUS | Status: DC

## 2021-01-13 MED ORDER — BUPIVACAINE HCL (PF) 0.5 % IJ SOLN
INTRAMUSCULAR | Status: AC
  Filled 2021-01-13: qty 10

## 2021-01-13 MED ORDER — APREPITANT 40 MG PO CAPS: 40.0000 mg | ORAL_CAPSULE | Freq: Once | ORAL | Status: AC

## 2021-01-13 MED ORDER — BUPIVACAINE LIPOSOME 1.3 % IJ SUSP
INTRAMUSCULAR | Status: AC
  Filled 2021-01-13: qty 20

## 2021-01-13 MED ORDER — FENTANYL CITRATE (PF) 100 MCG/2ML IJ SOLN
25.0000 ug | INTRAMUSCULAR | Status: DC | PRN
  Administered 2021-01-13 (×2): 25 ug via INTRAVENOUS

## 2021-01-13 MED ORDER — ROCURONIUM BROMIDE 100 MG/10ML IV SOLN
INTRAVENOUS | Status: DC | PRN
  Administered 2021-01-13: 10 mg via INTRAVENOUS
  Administered 2021-01-13: 60 mg via INTRAVENOUS

## 2021-01-13 MED ORDER — LACTATED RINGERS IR SOLN
Status: DC | PRN
  Administered 2021-01-13 (×4): 3001 mL

## 2021-01-13 MED ORDER — FENTANYL CITRATE PF 50 MCG/ML IJ SOSY
PREFILLED_SYRINGE | INTRAMUSCULAR | Status: AC
  Administered 2021-01-13: 09:00:00 50 ug via INTRAVENOUS
  Filled 2021-01-13: qty 1

## 2021-01-13 MED ORDER — ACETAMINOPHEN 10 MG/ML IV SOLN: 1000.0000 mg | Freq: Once | INTRAVENOUS | Status: DC | PRN

## 2021-01-13 MED ORDER — PROPOFOL 10 MG/ML IV BOLUS
INTRAVENOUS | Status: DC | PRN
  Administered 2021-01-13: 50 mg via INTRAVENOUS
  Administered 2021-01-13: 150 mg via INTRAVENOUS

## 2021-01-13 MED ORDER — FENTANYL CITRATE PF 50 MCG/ML IJ SOSY: 50.0000 ug | PREFILLED_SYRINGE | INTRAMUSCULAR | Status: DC | PRN

## 2021-01-13 MED ORDER — MIDAZOLAM HCL 2 MG/2ML IJ SOLN: 1.0000 mg | INTRAMUSCULAR | Status: DC | PRN

## 2021-01-13 MED ORDER — PROPOFOL 10 MG/ML IV BOLUS
INTRAVENOUS | Status: AC
  Filled 2021-01-13: qty 20

## 2021-01-13 MED ORDER — CHLORHEXIDINE GLUCONATE CLOTH 2 % EX PADS
6.0000 | MEDICATED_PAD | Freq: Once | CUTANEOUS | Status: AC
  Administered 2021-01-13: 08:00:00 6 via TOPICAL

## 2021-01-13 MED ORDER — LIDOCAINE HCL (CARDIAC) PF 100 MG/5ML IV SOSY
PREFILLED_SYRINGE | INTRAVENOUS | Status: DC | PRN
  Administered 2021-01-13: 50 mg via INTRAVENOUS

## 2021-01-13 SURGICAL SUPPLY — 72 items
ADAPTER IRRIG TUBE 2 SPIKE SOL (ADAPTER) ×3 IMPLANT
ADPR TBG 2 SPK PMP STRL ASCP (ADAPTER) ×1
ANCH SUT 5.5 KNTLS PEEK (Orthopedic Implant) ×2 IMPLANT
ANCH SUT Q-FX 2.8 (Anchor) ×4 IMPLANT
ANCHOR ALL-SUT Q-FIX 2.8 (Anchor) ×8 IMPLANT
ANCHOR SUT 5.5 MULTIFIX (Orthopedic Implant) ×2 IMPLANT
BLOCK TIBIAL HALF (MISCELLANEOUS) ×1 IMPLANT
CANISTER SUCT LVC 12 LTR MEDI- (MISCELLANEOUS) ×1 IMPLANT
CANNULA 5.75X7 CRYSTAL CLEAR (CANNULA) ×3 IMPLANT
CANNULA PARTIAL THREAD 2X7 (CANNULA) ×1 IMPLANT
CANNULA TWIST IN 8.25X9CM (CANNULA) ×2 IMPLANT
CONNECTOR PERFECT PASSER (CONNECTOR) ×3 IMPLANT
COOLER POLAR GLACIER W/PUMP (MISCELLANEOUS) ×2 IMPLANT
DEVICE SUCT BLK HOLE OR FLOOR (MISCELLANEOUS) ×2 IMPLANT
DRAPE 3/4 80X56 (DRAPES) ×2 IMPLANT
DRAPE IMP U-DRAPE 54X76 (DRAPES) ×4 IMPLANT
DRAPE INCISE IOBAN 66X45 STRL (DRAPES) ×1 IMPLANT
DRAPE U-SHAPE 47X51 STRL (DRAPES) ×3 IMPLANT
DURAPREP 26ML APPLICATOR (WOUND CARE) ×6 IMPLANT
ELECT REM PT RETURN 9FT ADLT (ELECTROSURGICAL) ×2
ELECTRODE REM PT RTRN 9FT ADLT (ELECTROSURGICAL) ×1 IMPLANT
GAUZE SPONGE 4X4 12PLY STRL (GAUZE/BANDAGES/DRESSINGS) ×2 IMPLANT
GAUZE XEROFORM 1X8 LF (GAUZE/BANDAGES/DRESSINGS) ×2 IMPLANT
GLOVE SURG ORTHO LTX SZ9 (GLOVE) ×6 IMPLANT
GLOVE SURG UNDER POLY LF SZ9 (GLOVE) ×2 IMPLANT
GOWN STRL REUS TWL 2XL XL LVL4 (GOWN DISPOSABLE) ×2 IMPLANT
GOWN STRL REUS W/ TWL LRG LVL3 (GOWN DISPOSABLE) ×1 IMPLANT
GOWN STRL REUS W/TWL LRG LVL3 (GOWN DISPOSABLE) ×2
IV LACTATED RINGER IRRG 3000ML (IV SOLUTION) ×16
IV LR IRRIG 3000ML ARTHROMATIC (IV SOLUTION) ×8 IMPLANT
KIT STABILIZATION SHOULDER (MISCELLANEOUS) ×2 IMPLANT
KIT SUTURE 2.8 Q-FIX DISP (MISCELLANEOUS) ×2 IMPLANT
KIT SUTURETAK 3.0 INSERT PERC (KITS) IMPLANT
KIT TURNOVER KIT A (KITS) ×2 IMPLANT
MANIFOLD NEPTUNE II (INSTRUMENTS) ×3 IMPLANT
MASK FACE SPIDER DISP (MASK) ×2 IMPLANT
MAT ABSORB  FLUID 56X50 GRAY (MISCELLANEOUS) ×4
MAT ABSORB FLUID 56X50 GRAY (MISCELLANEOUS) ×3 IMPLANT
NDL SAFETY ECLIPSE 18X1.5 (NEEDLE) ×1 IMPLANT
NEEDLE HYPO 18GX1.5 SHARP (NEEDLE) ×2
NEEDLE HYPO 22GX1.5 SAFETY (NEEDLE) ×1 IMPLANT
NS IRRIG 500ML POUR BTL (IV SOLUTION) ×1 IMPLANT
PACK ARTHROSCOPY SHOULDER (MISCELLANEOUS) ×2 IMPLANT
PAD ARMBOARD 7.5X6 YLW CONV (MISCELLANEOUS) ×4 IMPLANT
PAD WRAPON POLAR SHDR XLG (MISCELLANEOUS) ×1 IMPLANT
PASSER SUT FIRSTPASS SELF (INSTRUMENTS) ×2 IMPLANT
SHAVER BLADE BONE CUTTER 4.5 (BLADE) ×2 IMPLANT
SHAVER BLADE TAPERED BLUNT 4 (BLADE) ×2 IMPLANT
SPONGE T-LAP 18X18 ~~LOC~~+RFID (SPONGE) ×2 IMPLANT
STRIP CLOSURE SKIN 1/2X4 (GAUZE/BANDAGES/DRESSINGS) ×2 IMPLANT
SUT ETHILON 4-0 (SUTURE) ×2
SUT ETHILON 4-0 FS2 18XMFL BLK (SUTURE) ×1
SUT LASSO 90 DEG SD STR (SUTURE) IMPLANT
SUT MNCRL 4-0 (SUTURE) ×2
SUT MNCRL 4-0 27XMFL (SUTURE) ×1
SUT PDS AB 0 CT1 27 (SUTURE) ×5 IMPLANT
SUT PERFECTPASSER WHITE CART (SUTURE) ×6 IMPLANT
SUT SMART STITCH CARTRIDGE (SUTURE) ×6 IMPLANT
SUT ULTRABRAID 2 COBRAID 38 (SUTURE) IMPLANT
SUT VIC AB 0 CT1 36 (SUTURE) ×6 IMPLANT
SUT VIC AB 2-0 CT1 (SUTURE) ×1 IMPLANT
SUT VIC AB 2-0 CT2 27 (SUTURE) ×2 IMPLANT
SUTURE ETHLN 4-0 FS2 18XMF BLK (SUTURE) ×1 IMPLANT
SUTURE MNCRL 4-0 27XMF (SUTURE) ×1 IMPLANT
SYR 10ML LL (SYRINGE) ×2 IMPLANT
TAPE MICROFOAM 4IN (TAPE) ×2 IMPLANT
TUBING CONNECTING 10 (TUBING) ×1 IMPLANT
TUBING INFLOW SET DBFLO PUMP (TUBING) ×2 IMPLANT
TUBING OUTFLOW SET DBLFO PUMP (TUBING) ×2 IMPLANT
WAND WEREWOLF FLOW 90D (MISCELLANEOUS) ×2 IMPLANT
WATER STERILE IRR 500ML POUR (IV SOLUTION) ×2 IMPLANT
WRAPON POLAR PAD SHDR XLG (MISCELLANEOUS) ×2

## 2021-01-13 NOTE — Anesthesia Procedure Notes (Signed)
Procedure Name: Intubation Date/Time: 01/13/2021 10:43 AM Performed by: Johnna Acosta, CRNA Pre-anesthesia Checklist: Patient identified, Emergency Drugs available, Suction available, Patient being monitored and Timeout performed Patient Re-evaluated:Patient Re-evaluated prior to induction Oxygen Delivery Method: Circle system utilized Preoxygenation: Pre-oxygenation with 100% oxygen Induction Type: IV induction Ventilation: Mask ventilation without difficulty Laryngoscope Size: McGraph and 3 Grade View: Grade I Tube type: Oral Tube size: 7.0 mm Number of attempts: 1 Airway Equipment and Method: Stylet and Video-laryngoscopy Placement Confirmation: ETT inserted through vocal cords under direct vision, positive ETCO2 and breath sounds checked- equal and bilateral Secured at: 22 cm Tube secured with: Tape Dental Injury: Teeth and Oropharynx as per pre-operative assessment and Injury to lip

## 2021-01-13 NOTE — Transfer of Care (Signed)
Immediate Anesthesia Transfer of Care Note  Patient: Bethany Scott  Procedure(s) Performed: SHOULDER ARTHROSCOPY WITH OPEN ROTATOR CUFF REPAIR AND DISTAL CLAVICLE ACROMINECTOMY (Right: Shoulder) SHOULDER ARTHROSCOPY WITH SUBACROMIAL DECOMPRESSION, BICEPS TENOTOMY (Right: Shoulder)  Patient Location: PACU  Anesthesia Type:General  Level of Consciousness: awake, alert  and oriented  Airway & Oxygen Therapy: Patient Spontanous Breathing and Patient connected to nasal cannula oxygen  Post-op Assessment: Report given to RN and Post -op Vital signs reviewed and stable  Post vital signs: Reviewed and stable  Last Vitals:  Vitals Value Taken Time  BP 118/73 01/13/21 1330  Temp    Pulse 93 01/13/21 1333  Resp 19 01/13/21 1333  SpO2 92 % 01/13/21 1333  Vitals shown include unvalidated device data.  Last Pain:  Vitals:   01/13/21 0756  TempSrc: Temporal  PainSc: 6          Complications: No notable events documented.

## 2021-01-13 NOTE — Anesthesia Procedure Notes (Signed)
Anesthesia Regional Block: Interscalene brachial plexus block   Pre-Anesthetic Checklist: , timeout performed,  Correct Patient, Correct Site, Correct Laterality,  Correct Procedure,, risks and benefits discussed,  Surgical consent,  Pre-op evaluation,  At surgeon's request and post-op pain management  Laterality: Right  Prep: chloraprep       Needles:  Injection technique: Single-shot  Needle Type: Echogenic Needle          Additional Needles:   Procedures:,,,, ultrasound used (permanent image in chart),,   Motor weakness within 20 minutes.  Narrative:  Start time: 01/13/2021 9:08 AM End time: 01/13/2021 9:11 AM Injection made incrementally with aspirations every 5 mL.  Performed by: Personally  Anesthesiologist: Darrin Nipper, MD  Additional Notes: Functioning IV was confirmed and monitors applied.  Sterile prep and drape, hand hygiene and sterile gloves were used. Ultrasound guidance: relevant anatomy identified, needle position confirmed, local anesthetic spread visualized around nerve(s), vascular puncture avoided.  Image saved to electronic medical record.  Negative aspiration prior to incremental administration of local anesthetic for total 20 ml Exparel and 10 ml bupivacaine 0.5% given in interscalene distribution. The patient tolerated the procedure well. Vital signs and moderate sedation medications recorded in RN notes.

## 2021-01-13 NOTE — H&P (Signed)
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  Patient has a small interstitial tear of the rotator cuff.  Symptoms of pain and limited range of motion of the right shoulder are significantly impairing activities of daily living and her ability to perform her duties at work..  Patient failed nonoperative management wished to proceed with surgical debridement and possible rotator cuff repair.  Past Medical History:  Diagnosis Date   Allergy    Anemia    Anxiety    Arthritis    OA Knee; non-specific autoimmune process followed by [facility]/Rheumatology.   Asthma    Blood transfusion without reported diagnosis YRS AGO   COVID-19 03/2018   Depression    Gastric ulcer, unspecified as acute or chronic, without mention of hemorrhage, perforation, or obstruction YRS AGO   GERD (gastroesophageal reflux disease)    Hyperlipidemia    Hypertension    Migraine    hx of migraines   Obesity, unspecified    Other chronic cystitis    Personal history of colonic polyps    Pneumonia    PONV (postoperative nausea and vomiting)    NAUSEA, SCOPOLAMINE PATCH HELPED WITH LAST SURGERIES   Pre-diabetes    Past Surgical History:  Procedure Laterality Date   ABDOMINAL HYSTERECTOMY  01/25/1994   uterine fibroids; DUB; cervical dysplasia; ovaries resected two years later.     APPENDECTOMY     BILATERAL OOPHORECTOMY  2001   CESAREAN SECTION     x 2   COLON SURGERY  YRS AGO   SIGMOIDECTOMY   Colonoscopy  09/08/2012   diverticulosis, hemorrhoids.  [name].  Symptoms:  anemia, hemoccult +.   ESOPHAGOGASTRODUODENOSCOPY  09/08/2012   +HH.  [name].  Symptoms: anemia, hemoccult +.   HIP ARTHROSCOPY Right    LAPAROSCOPIC GASTRIC SLEEVE RESECTION N/A 03/01/2016    Procedure: LAPAROSCOPIC GASTRIC SLEEVE RESECTION, UPPER ENDO;  Surgeon: [name], MD;  Location: [facility];  Service: General;  Laterality: N/A;   LAPAROSCOPIC LYSIS OF ADHESIONS N/A 02/10/2016   Procedure: LAPAROSCOPIC LYSIS OF ADHESIONS;  Surgeon: [name], MD;  Location: [facility];  Service: General;  Laterality: N/A;   LAPAROSCOPY N/A 02/10/2016   Procedure: LAPAROSCOPY DIAGNOSTIC;  Surgeon: [name], MD;  Location: [facility];  Service: General;  Laterality: N/A;   NASAL SEPTUM SURGERY     NECK SURGERY     Rheumatology Consult  11/26/2011   multiple arthralgias. Autoimmune labs negative.  Rx for Plaquenil for non-specific autoimmune process.  [name].   SIGMOID RESECTION / RECTOPEXY     SPINE SURGERY     TONSILLECTOMY AND ADENOIDECTOMY     TOTAL HIP ARTHROPLASTY Right 09/27/2017   Procedure: TOTAL HIP ARTHROPLASTY ANTERIOR APPROACH;  Surgeon: [name], MD;  Location: [facility];  Service: Orthopedics;  Laterality: Right;   TOTAL HIP ARTHROPLASTY Left 12/27/2017   Procedure: TOTAL HIP ARTHROPLASTY ANTERIOR APPROACH;  Surgeon: [name], MD;  Location: [facility];  Service: Orthopedics;  Laterality: Left;   Social History   Socioeconomic History   Marital status: Married    Spouse name: Not on file   Number of children: 2   Years of education: 12   Highest education level: Not on file  Occupational History   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      Children: 2 sons (24, 66); 2 grandchildren  (6yo Atlast, 59 yo August)      Lives: with husband, [name]/son.      Employment: check in at [employer] in [place] x 2004; loves job.      Tobacco abuse:  Former smoker.      Alcohol:  None; holidays only      Exercise: none in 2017     Seatbelt: 100%; no texting   Social Determinants of Health   Financial Resource Strain: Not on file  Food Insecurity: Not on file  Transportation Needs: Not on file  Physical Activity: Not on file  Stress: Not on file  Social Connections: Not on file   Family History  Problem Relation Age of Onset   Bipolar disorder Father    Transient ischemic attack Father    Dementia Father        secondary to head trauma   Hypertension Father    Diabetes Father    CAD Father    Cancer Mother 3       lung   Migraines Mother    Hypothyroidism Mother    Hypertension Sister    Atrial fibrillation Sister    Hypothyroidism Sister    Alcohol abuse Brother    Hypertension Brother    Alcohol abuse Brother    Atrial fibrillation Maternal Grandmother    COPD Maternal Grandmother    Diabetes Maternal Grandmother    Hyperlipidemia Maternal Grandmother    Diabetes Maternal Grandfather    Diabetes Paternal Grandfather    Heart disease Paternal Grandfather    Breast cancer Sister 46   Allergies  Allergen Reactions   Cephalexin Anaphylaxis   Cephalosporins Anaphylaxis   Penicillins Anaphylaxis and Other (See Comments)    Has patient had a PCN reaction causing immediate rash, facial/tongue/throat swelling, SOB or lightheadedness with hypotension: Yes Has patient had a PCN reaction causing severe rash involving mucus membranes or skin necrosis: No Has patient had a PCN reaction that required hospitalization Yes Has patient had a PCN reaction occurring within the last 10 years: No If all of the above answers are "NO", then may proceed with  Cephalosporin use.    Prior to Admission medications   Medication Sig Start Date End Date Taking? Authorizing Provider  albuterol (VENTOLIN HFA) 108 (90 Base) MCG/ACT inhaler Inhale 1-2 puffs into the lungs every 6 (six) hours as needed for wheezing or shortness of breath. 01/30/20  Yes Bast, Traci A, NP  Azelastine-Fluticasone 137-50 MCG/ACT SUSP Place 2 sprays into the nose 2 (two) times a day. 08/10/18  Yes Darlin Priestly, PA-C  budesonide-formoterol (SYMBICORT) 160-4.5 MCG/ACT inhaler INHALE 2 PUFFS BY MOUTH 2 TIMES DAILY 03/28/20 03/28/21 Yes Tiajuana Amass, MD  busPIRone (BUSPAR) 10 MG tablet  Take 1 tablet (10 mg total) by mouth 3 (three) times daily Patient taking differently: Take 10 mg by mouth daily as needed (anxiety). 12/25/20  Yes   dexlansoprazole (DEXILANT) 60 MG capsule Take 1 capsule (60 mg total) by mouth once daily 06/13/20  Yes   DULoxetine (CYMBALTA) 60 MG capsule Take 1 capsule (60 mg total) by mouth 2 (two) times daily 12/25/20  Yes   gabapentin (NEURONTIN) 300 MG capsule TAKE 1 TO 3 CAPSULES BY MOUTH THREE TIMES DAILY Patient taking differently: Take 300-600 mg by mouth See admin instructions. Take 300-600 mg by mouth in the morning, 300 mg at lunch and 600 mg at bedtime. 10/19/19 01/13/21 Yes [name], MD  HYDROcodone-acetaminophen [facility]) 10-325 MG tablet Take 1 tablet by mouth every 6 (six) hours as needed for moderate pain.   Yes [provider]  HYDROcodone-acetaminophen (NORCO) 10-325 MG tablet Take 1 tablet by mouth every 4 (four) hours as needed for Pain 01/07/21  Yes   hydrocortisone 2.5 % cream Apply topically 2 (two) times daily as needed (Rash). At corners of mouth for up to one week. 10/09/20  Yes Moye, [place], MD  ketoconazole (NIZORAL) 2 % shampoo apply three times per week, massage into scalp and leave in for 10 minutes before rinsing out Patient taking differently: Apply 1 application topically every 14 (fourteen) days. massage into scalp and leave in  for 10 minutes before rinsing out 11/13/20  Yes Moye, [place], MD  levocetirizine (XYZAL) 5 MG tablet TAKE 1 TABLET BY MOUTH EVERY EVENING 09/09/19 01/02/21 Yes [name], MD  methocarbamol (ROBAXIN) 750 MG tablet Take 750 mg by mouth 3 (three) times daily as needed for muscle spasms.   Yes [provider]  montelukast (SINGULAIR) 10 MG tablet Take 1 tablet (10 mg total) by mouth at bedtime Patient taking differently: Take 10 mg by mouth in the morning. 12/25/20  Yes   mupirocin ointment (BACTROBAN) 2 % Apply 1 application topically 2 (two) times daily. To corners of mouth Patient taking differently: Apply 1 application topically daily as needed (corners of mouth). 10/09/20  Yes Moye, [place], MD  nitrofurantoin, macrocrystal-monohydrate, (MACROBID) 100 MG capsule Take 1 capsule (100 mg total) by mouth once daily as needed 12/25/20  Yes   promethazine (PHENERGAN) 25 MG tablet Take 1 tablet (25 mg total) by mouth every 8 (eight) hours as needed for nausea 12/25/20  Yes   benzonatate (TESSALON) 100 MG capsule Take 1 capsule (100 mg total) by mouth 3 (three) times daily as needed. 01/01/21   Mar Daring, PA-C  busPIRone (BUSPAR) 10 MG tablet Take 1 tablet (10 mg total) by mouth 3 (three) times daily Patient not taking: Reported on 01/02/2021 06/02/20     ferrous [id number] C-folic acid (TRINSICON / FOLTRIN) capsule Take 1 capsule by mouth 2 (two) times daily after a meal. Patient not taking: Reported on 01/02/2021 09/21/20   Lorella Nimrod, MD  ipratropium (ATROVENT) 0.03 % nasal spray Place 2 sprays into both nostrils every 12 (twelve) hours. 01/01/21   Mar Daring, PA-C  nystatin cream (MYCOSTATIN) Apply two to three times per day as needed Patient not taking: Reported on 01/02/2021 06/18/20   Laurence Ferrari, [place], MD  omeprazole (PRILOSEC) 40 MG capsule Take one capsule by mouth twice daily before meals. Patient not taking: Reported on 01/02/2021 10/24/20   Ottie Glazier, MD   promethazine (PHENERGAN) 25 MG tablet TAKE 1 TABLET BY MOUTH EVERY 8 HOURS AS NEEDED FOR NAUSEA OR VOMITING  Patient not taking: Reported on 01/02/2021 02/15/20 02/14/21  [name], MD  promethazine-dextromethorphan (PROMETHAZINE-DM) 6.25-15 MG/5ML syrup Take 5 mLs by mouth 4 (four) times daily as needed for cough. 01/01/21   Mar Daring, PA-C  triamcinolone (KENALOG) 0.1 % paste Dry ulcer with tissue and then apply paste to affected area twice a day until no longer tender. Patient not taking: Reported on 10/06/2020 02/21/20   Laurence Ferrari, [place], MD     Positive ROS: All other systems have been reviewed and were otherwise negative with the exception of those mentioned in the HPI and as above.  Physical Exam: General: Alert, no acute distress Cardiovascular: Regular rate and rhythm, no murmurs rubs or gallops.  No pedal edema Respiratory: Clear to auscultation bilaterally, no wheezes rales or rhonchi. No cyanosis, no use of accessory musculature GI: No organomegaly, abdomen is soft and non-tender nondistended with positive bowel sounds. Skin: Skin intact, no lesions within the operative field. Neurologic: Sensation intact distally Psychiatric: Patient is competent for consent with normal mood and affect Lymphatic: No cervical lymphadenopathy  MUSCULOSKELETAL: Right Shoulder: The patient can forward elevate to 150 degrees and abduct to approximately 140 degrees with pain. She has pain with impingement testing. Mild AC joint tenderness. The patient has full digital, wrist and elbow range of motion, intact sensation to light touch and a palpable radial pulse. She has pain with a downward directed force on her abducted shoulder without significant weakness.   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  I explained to the patient that I expect she will need to undergo subacromial decompression and debridement with distal clavicle excision and possible rotator cuff repair.  We discussed the difference in postoperative recovery time depending on what is required during surgery.  I signed the right shoulder according hospital's correct site of surgery protocol after verbally confirming with the patient that this was the correct site of surgery.  A preop history and physical was performed at the bedside.  I discussed the risks and benefits of surgery. The risks include but are not limited to infection, bleeding, nerve or blood vessel injury, joint stiffness or loss of motion, persistent pain, weakness or instability, retear of the rotator cuff and the need for further surgery. Patient understood these risks and wished to proceed.     Thornton Park, MD   01/13/2021 10:33 AM

## 2021-01-13 NOTE — Discharge Instructions (Signed)

## 2021-01-13 NOTE — Op Note (Signed)
01/13/2021  1:20 PM  PATIENT:  Bethany Scott  65 y.o. female  PRE-OPERATIVE DIAGNOSIS:  Right Shoulder Rotator Cuff tendinosis with possible tear  POST-OPERATIVE DIAGNOSIS:  Right Shoulder full-thickness rotator cuff tear, high-grade partial tear of the long head of the biceps tendon, subacromial impingement and glenohumeral and acromioclavicular joint arthrosis  PROCEDURE: Right shoulder arthroscopic biceps tenotomy, chondroplasty of the glenoid and humeral head, subacromial decompression, distal clavicle excision and mini open rotator cuff repair.  SURGEON:  Surgeon(s) and Role:    * Thornton Park, MD - Primary  ANESTHESIA:   general and paracervical block   PREOPERATIVE INDICATIONS:  Bethany Scott is a  65 y.o. female with a diagnosis of Right Shoulder Rotator Cuff Tear who failed conservative measures and elected for surgical management.    The risks benefits and alternatives were discussed with the patient preoperatively including but not limited to the risks of infection, bleeding, nerve injury, persistent pain or weakness, failure of the hardware, re-tear of the rotator cuff and the need for further surgery. Medical risks include DVT and pulmonary embolism, myocardial infarction, stroke, pneumonia, respiratory failure and death. Patient understood these risks and wished to proceed.  OPERATIVE IMPLANTS: [place] MultiFix anchors x 2 & Smith [employer] Nephew Q Fix anchors x 2  OPERATIVE PROCEDURE: The patient was met in the preoperative area.  A preop history and physical was performed at the bedside.  The right shoulder was signed with the word yes and my initials according the hospital's correct site of surgery protocol. The patient was then brought to the OR and underwent general endotracheal intubation by the anesthesia service.  The patient was placed in a beachchair position.  A spider arm positioner was used for this case. Examination under anesthesia revealed full  passive range of motion and no instability on load-and-shift testing.  Patient had negative sulcus sign..  The patient was prepped and draped in a sterile fashion. A timeout was performed to verify the patient's name, date of birth, medical record number, correct site of surgery and correct procedure to be performed there was also used to verify the patient received antibiotics that all appropriate instruments, implants and radiographs studies were available in the room. Once all in attendance were in agreement case began.  Bony landmarks were drawn out with a surgical marker along with proposed arthroscopy incisions. An 11 blade was used to establish a posterior portal through which the arthroscope was placed in the glenohumeral joint. A full diagnostic examination of the shoulder was performed.  The anterior portal was established under direct visualization with an 18-gauge spinal needle.  A 5.75 mm arthroscopic cannula was placed through the anterior portal.   The intra-articular portion of the biceps tendon was found to have a partial tear involving greater than 50% of the diameter and severely frayed.  It was felt that the biceps tendon was too frayed to perform a tenodesis and therefore the decision was made to perform a tenotomy. An 90 degree werewolf wand was used to release the biceps tendon off the superior labrum. The arthroscopic shaver was then used to debride the frayed edges of the labrum. There were no anterior or superior labral tears seen.  Subscapularis tendon was intact but had significant fraying in line with its fibers.  There is no detachment from the lesser tuberosity.  Patient was found to have a full-thickness chondral flap of the inferior glenoid which was debrided with a Dyonics flyer shaver blade.  Patient had  small chondral flaps of the humeral head which were also debrided with a Dyonics flyer shaver blade.  The patient had a full-thickness tear involving the supraspinatus with  mild retraction. There were no loose bodies within the inferior recess and no evidence of HAGL lesion.  The arthroscope was then placed in the subacromial space. A lateral portal was then established using an 18-gauge spinal needle for localization.  The rotator cuff tear was debrided until healthy tissue was established at its lateral margin.  The greater tuberosity was debrided using a 4.5 mm bone cutter shaver blade to remove all remaining torn fibers of the rotator cuff.  Debridement was performed until punctate bleeding was seen at the greater tuberosity footprint, which will allow for rotator cuff healing.  Extensive bursitis was encountered and debrided using the Dyonics flyer shaver blade and a Palmer werewolf wand from the lateral portal. A subacromial decompression was also performed using a 4.5 mm bone cutter shaver blade from the lateral portal and was then placed through the anterior portal and distal clavicle excision was performed using the same shaver blade.  Four ArthroCare Perfect Pass sutures were placed in the lateral border of the rotator cuff tear. All arthroscopic instruments were then removed and the mini-open portion of the procedure began.   A saber-type incision was made along the lateral border of the acromion. The deltoid muscle was identified and split in line with its fibers which allowed visualization of the rotator cuff.  The Perfect Pass sutures previously placed in the lateral border of the rotator cuff were also brought out through the deltoid split.   Two Q-Fix anchors were then placed at the articular margin of the humeral head and greater tuberosity. The four suture limbs of two Q Fix anchors were passed medially through the rotator cuff using a first pass suture passer.  The Perfect Pass sutures from the lateral border of the rotator cuff were then anchored to the greater tuberosity of the humeral head using two Multifix anchors. These anchors were tensioned to  allow for anatomic reduction of the rotator cuff to the greater tuberosity footprint.  The medial row repair was then completed using an arthroscopic knot tying technique with the Q fix anchor sutures.  Once all sutures were tied down, arthroscopic images of the double row repair were taken with the arthroscope both externally and arthroscopically from the glenohumeral joint  All incisions were copiously irrigated. The deltoid fascia was repaired using a 0 Vicryl suturean interrupted fashion.  The subcutaneous tissue of all incisions were closed with a 2-0 Vicryl. Skin closure for the arthroscopic incisions was performed with 4-0 nylon. The skin edges of the saber incision were approximated with a running 4-0 undyed Monocryl.   A dry sterile dressing including Steri-Strips was applied.  The patient was placed in an abduction sling, with a Polar Care sleeve.  All sharp and instrument counts were correct at the conclusion of the case. I was scrubbed and present for the entire case. I spoke with the patient's husband in the post-op consultation room and informed him that the case had been performed without complication and the patient was stable in recovery room.  I reviewed the postop instructions with him and answered all his questions.    Timoteo Gaul, MD

## 2021-01-13 NOTE — Anesthesia Preprocedure Evaluation (Addendum)
Anesthesia Evaluation  Patient identified by MRN, date of birth, ID band Patient awake    Reviewed: Allergy & Precautions, NPO status , Patient's Chart, lab work & pertinent test results  History of Anesthesia Complications (+) PONV and history of anesthetic complications  Airway Mallampati: III   Neck ROM: Full    Dental   Upper front teeth missing (temporary in place):   Pulmonary asthma , former smoker (quit 36 years ago),    Pulmonary exam normal breath sounds clear to auscultation       Cardiovascular hypertension, Normal cardiovascular exam Rhythm:Regular Rate:Normal  ECG 09/17/20: Sinus tachycardia (HR 106), otherwise normal   Neuro/Psych  Headaches, PSYCHIATRIC DISORDERS Anxiety Depression    GI/Hepatic PUD, GERD  ,S/p gastric sleeve   Endo/Other  Obesity; prediabetes  Renal/GU negative Renal ROS     Musculoskeletal  (+) Arthritis ,   Abdominal   Peds  Hematology  (+) Blood dyscrasia, anemia ,   Anesthesia Other Findings   Reproductive/Obstetrics                            Anesthesia Physical Anesthesia Plan  ASA: 3  Anesthesia Plan: General and Regional   Post-op Pain Management: Regional block   Induction: Intravenous  PONV Risk Score and Plan: 4 or greater and Ondansetron, Dexamethasone, Scopolamine patch - Pre-op and Treatment may vary due to age or medical condition  Airway Management Planned: Oral ETT  Additional Equipment:   Intra-op Plan:   Post-operative Plan: Extubation in OR  Informed Consent: I have reviewed the patients History and Physical, chart, labs and discussed the procedure including the risks, benefits and alternatives for the proposed anesthesia with the patient or authorized representative who has indicated his/her understanding and acceptance.     Dental advisory given  Plan Discussed with: CRNA  Anesthesia Plan Comments: (Patient  consented for risks of anesthesia including but not limited to:  - adverse reactions to medications - damage to eyes, teeth, lips or other oral mucosa - nerve damage due to positioning  - sore throat or hoarseness - damage to heart, brain, nerves, lungs, other parts of body or loss of life  Informed patient about role of CRNA in peri- and intra-operative care.  Patient voiced understanding.)        Anesthesia Quick Evaluation

## 2021-01-14 ENCOUNTER — Encounter: Payer: Self-pay | Admitting: Orthopedic Surgery

## 2021-01-22 ENCOUNTER — Inpatient Hospital Stay: Payer: 59 | Attending: Oncology | Admitting: Oncology

## 2021-01-22 ENCOUNTER — Inpatient Hospital Stay: Payer: 59

## 2021-01-22 ENCOUNTER — Other Ambulatory Visit: Payer: Self-pay | Admitting: Oncology

## 2021-01-22 DIAGNOSIS — D539 Nutritional anemia, unspecified: Secondary | ICD-10-CM

## 2021-01-28 NOTE — Anesthesia Postprocedure Evaluation (Signed)
Anesthesia Post Note  Patient: GEORGEANA OERTEL  Procedure(s) Performed: SHOULDER ARTHROSCOPY WITH OPEN ROTATOR CUFF REPAIR AND DISTAL CLAVICLE ACROMINECTOMY (Right: Shoulder) SHOULDER ARTHROSCOPY WITH SUBACROMIAL DECOMPRESSION, BICEPS TENOTOMY (Right: Shoulder)  Patient location during evaluation: PACU Anesthesia Type: Regional Level of consciousness: awake and alert Pain management: pain level controlled Vital Signs Assessment: post-procedure vital signs reviewed and stable Respiratory status: spontaneous breathing, nonlabored ventilation, respiratory function stable and patient connected to nasal cannula oxygen Cardiovascular status: blood pressure returned to baseline and stable Postop Assessment: no apparent nausea or vomiting Anesthetic complications: no   No notable events documented.   Last Vitals:  Vitals:   01/13/21 1400 01/13/21 1426  BP: 123/62 116/60  Pulse: 93 95  Resp: 18 16  Temp: (!) 36.2 C 37.3 C  SpO2: 95% 95%    Last Pain:  Vitals:   01/13/21 1426  TempSrc: Temporal  PainSc: [place]ell Noll

## 2021-02-04 ENCOUNTER — Other Ambulatory Visit: Payer: Self-pay

## 2021-02-05 ENCOUNTER — Other Ambulatory Visit: Payer: Self-pay

## 2021-02-05 MED ORDER — HYDROCODONE-ACETAMINOPHEN 10-325 MG PO TABS
ORAL_TABLET | ORAL | 0 refills | Status: DC
  Filled 2021-02-05: qty 150, 25d supply, fill #0

## 2021-02-06 ENCOUNTER — Other Ambulatory Visit: Payer: Self-pay

## 2021-02-17 ENCOUNTER — Other Ambulatory Visit: Payer: 59

## 2021-02-18 ENCOUNTER — Other Ambulatory Visit (HOSPITAL_COMMUNITY): Payer: Self-pay

## 2021-03-04 ENCOUNTER — Other Ambulatory Visit: Payer: Self-pay

## 2021-03-04 MED ORDER — HYDROCODONE-ACETAMINOPHEN 5-325 MG PO TABS
ORAL_TABLET | ORAL | 0 refills | Status: DC
  Filled 2021-03-04: qty 30, 15d supply, fill #0

## 2021-03-06 ENCOUNTER — Other Ambulatory Visit: Payer: Self-pay

## 2021-03-06 MED ORDER — HYDROCODONE-ACETAMINOPHEN 10-325 MG PO TABS
ORAL_TABLET | ORAL | 0 refills | Status: DC
  Filled 2021-03-09: qty 150, 30d supply, fill #0

## 2021-03-09 ENCOUNTER — Other Ambulatory Visit: Payer: Self-pay

## 2021-03-10 ENCOUNTER — Other Ambulatory Visit: Payer: Self-pay

## 2021-03-10 MED ORDER — GABAPENTIN 300 MG PO CAPS
ORAL_CAPSULE | ORAL | 3 refills | Status: DC
  Filled 2021-03-10: qty 810, 90d supply, fill #0
  Filled 2021-06-12: qty 810, 90d supply, fill #1
  Filled 2021-09-17: qty 810, 90d supply, fill #2

## 2021-03-11 ENCOUNTER — Other Ambulatory Visit: Payer: Self-pay

## 2021-03-11 MED ORDER — FLUCONAZOLE 150 MG PO TABS: 150.0000 mg | ORAL_TABLET | Freq: Every day | ORAL | 0 refills | Status: DC

## 2021-03-11 MED ORDER — NYSTATIN 100000 UNIT/GM EX CREA: 1.0000 "application " | TOPICAL_CREAM | Freq: Two times a day (BID) | CUTANEOUS | 0 refills | Status: DC

## 2021-03-12 ENCOUNTER — Other Ambulatory Visit: Payer: Self-pay

## 2021-03-16 ENCOUNTER — Telehealth: Payer: Self-pay | Admitting: *Deleted

## 2021-03-16 ENCOUNTER — Inpatient Hospital Stay: Payer: Self-pay | Admitting: Oncology

## 2021-03-16 ENCOUNTER — Inpatient Hospital Stay: Payer: Self-pay

## 2021-03-16 NOTE — Telephone Encounter (Signed)
Patient needs to reschedule today's appointment. She can take any appointment on Fridays please call her with new appointment.

## 2021-03-16 NOTE — Telephone Encounter (Signed)
Appts have been rescheduled, left Vm with appt details. Encouraged to call back with any questions.

## 2021-03-25 ENCOUNTER — Telehealth: Payer: Self-pay

## 2021-03-25 NOTE — Telephone Encounter (Signed)
Sent in RFs of Intertrigo and rosacea creams to [facility].  ?

## 2021-03-27 ENCOUNTER — Encounter: Payer: Self-pay | Admitting: Oncology

## 2021-03-27 ENCOUNTER — Inpatient Hospital Stay (HOSPITAL_BASED_OUTPATIENT_CLINIC_OR_DEPARTMENT_OTHER): Payer: HMO | Admitting: Oncology

## 2021-03-27 ENCOUNTER — Other Ambulatory Visit: Payer: Self-pay

## 2021-03-27 ENCOUNTER — Inpatient Hospital Stay: Payer: Self-pay | Attending: Oncology

## 2021-03-27 VITALS — BP 138/85 | HR 96 | Temp 98.4°F | Resp 18 | Wt 202.4 lb

## 2021-03-27 DIAGNOSIS — Z9884 Bariatric surgery status: Secondary | ICD-10-CM | POA: Insufficient documentation

## 2021-03-27 DIAGNOSIS — D599 Acquired hemolytic anemia, unspecified: Secondary | ICD-10-CM | POA: Insufficient documentation

## 2021-03-27 DIAGNOSIS — D539 Nutritional anemia, unspecified: Secondary | ICD-10-CM

## 2021-03-27 DIAGNOSIS — R109 Unspecified abdominal pain: Secondary | ICD-10-CM

## 2021-03-27 LAB — COMPREHENSIVE METABOLIC PANEL
ALT: 12 U/L (ref 0–44)
AST: 19 U/L (ref 15–41)
Albumin: 4.2 g/dL (ref 3.5–5.0)
Alkaline Phosphatase: 58 U/L (ref 38–126)
Anion gap: 6 (ref 5–15)
BUN: 7 mg/dL — ABNORMAL LOW (ref 8–23)
CO2: 29 mmol/L (ref 22–32)
Calcium: 9.3 mg/dL (ref 8.9–10.3)
Chloride: 104 mmol/L (ref 98–111)
Creatinine, Ser: 0.56 mg/dL (ref 0.44–1.00)
GFR, Estimated: >60 mL/min (ref >60)
Glucose, Bld: 147 mg/dL — ABNORMAL HIGH (ref 70–99)
Potassium: 4.3 mmol/L (ref 3.5–5.1)
Sodium: 139 mmol/L (ref 135–145)
Total Bilirubin: 0.1 mg/dL — ABNORMAL LOW (ref 0.3–1.2)
Total Protein: 7.3 g/dL (ref 6.5–8.1)

## 2021-03-27 LAB — CBC WITH DIFFERENTIAL/PLATELET
Abs Immature Granulocytes: 0.02 10*3/uL (ref 0.00–0.07)
Basophils Absolute: 0 10*3/uL (ref 0.0–0.1)
Basophils Relative: 1 %
Eosinophils Absolute: 0.4 10*3/uL (ref 0.0–0.5)
Eosinophils Relative: 6 %
HCT: 46.7 % — ABNORMAL HIGH (ref 36.0–46.0)
Hemoglobin: 15.2 g/dL — ABNORMAL HIGH (ref 12.0–15.0)
Immature Granulocytes: 0 %
Lymphocytes Relative: 24 %
Lymphs Abs: 1.5 10*3/uL (ref 0.7–4.0)
MCH: 29.7 pg (ref 26.0–34.0)
MCHC: 32.5 g/dL (ref 30.0–36.0)
MCV: 91.2 fL (ref 80.0–100.0)
Monocytes Absolute: 0.4 10*3/uL (ref 0.1–1.0)
Monocytes Relative: 6 %
Neutro Abs: 4 10*3/uL (ref 1.7–7.7)
Neutrophils Relative %: 63 %
Platelets: 311 10*3/uL (ref 150–400)
RBC: 5.12 MIL/uL — ABNORMAL HIGH (ref 3.87–5.11)
RDW: 12.2 % (ref 11.5–15.5)
WBC: 6.3 10*3/uL (ref 4.0–10.5)
nRBC: 0 % (ref 0.0–0.2)

## 2021-03-27 LAB — DAT, POLYSPECIFIC AHG (ARMC ONLY): Polyspecific AHG test: NEGATIVE

## 2021-03-27 LAB — LACTATE DEHYDROGENASE: LDH: 129 U/L (ref 98–192)

## 2021-03-27 NOTE — Progress Notes (Signed)
Hematology/Oncology Progress note [phone] [phone]      Patient Care Team: [name], MD as PCP - General (Family Medicine) Ammie Dalton, Okey Regal, MD (Obstetrics and Gynecology)  REFERRING PROVIDER: [name], MD  CHIEF COMPLAINTS/REASON FOR VISIT:  anemia  HISTORY OF PRESENTING ILLNESS:   Bethany Scott is a  66 y.o.  female with PMH listed below was seen in consultation at the request of  [name], MD  for evaluation of anemia Patient has a history of gastric bypass.  She has a history of iron deficiency anemia and folic acid deficiency. Patient was recently admitted from 09/17/2020 - 09/21/2020 for pneumonia.  Chest x-ray was concerning for bilateral opacities/pulmonary vascular congestion.  CTA was negative for PE but showed extensive right-sided infiltrate and groundglass opacities.  Concerning for pneumonia.  Reports was complicated by asthma exacerbation.  Patient was treated with antibiotics.  She was also found to have macrocytic anemia, hemoglobin 6.6.  She received 2 units of PRBC and hemoglobin increased to 8.2 at discharge.  Patient's B12 and folate levels were adequae..  Patient reports feeling tired and fatigued.  Dozing off during football game yesterday.  Some shortness of breath with exertion as well Denies any bloody or black stool.  Denies abdominal pain, unintentional weight loss, night sweats or fever.   INTERVAL HISTORY Bethany Scott is a 66 y.o. female who has above history reviewed by me today presents for follow up visit for anemia. She reports stabbing stomach pain last night, better today. She has also noticed dark stool recently. Recently She took 3 days of advil dural action.   Review of Systems  Constitutional:  Positive for fatigue. Negative for appetite change, chills and fever.  HENT:   Negative for hearing loss and voice change.   Eyes:  Negative for eye problems.  Respiratory:  Negative for chest  tightness and cough.   Cardiovascular:  Negative for chest pain.  Gastrointestinal:  Negative for abdominal distention, abdominal pain and blood in stool.  Endocrine: Negative for hot flashes.  Genitourinary:  Negative for difficulty urinating and frequency.   Musculoskeletal:  Negative for arthralgias.       Chronic aches and pains  Skin:  Negative for itching and rash.  Neurological:  Negative for extremity weakness.  Hematological:  Negative for adenopathy.  Psychiatric/Behavioral:  Negative for confusion.    MEDICAL HISTORY:  Past Medical History:  Diagnosis Date   Allergy    Anemia    Anxiety    Arthritis    OA Knee; non-specific autoimmune process followed by [facility]/Rheumatology.   Asthma    Blood transfusion without reported diagnosis YRS AGO   COVID-19 03/2018   Depression    Gastric ulcer, unspecified as acute or chronic, without mention of hemorrhage, perforation, or obstruction YRS AGO   GERD (gastroesophageal reflux disease)    Hyperlipidemia    Hypertension    Migraine    hx of migraines   Obesity, unspecified    Other chronic cystitis    Personal history of colonic polyps    Pneumonia    PONV (postoperative nausea and vomiting)    NAUSEA, SCOPOLAMINE PATCH HELPED WITH LAST SURGERIES   Pre-diabetes     SURGICAL HISTORY: Past Surgical History:  Procedure Laterality Date   ABDOMINAL HYSTERECTOMY  01/25/1994   uterine fibroids; DUB; cervical dysplasia; ovaries resected two years later.     APPENDECTOMY     BILATERAL OOPHORECTOMY  2001  CESAREAN SECTION     x 2   COLON SURGERY  YRS AGO   SIGMOIDECTOMY   Colonoscopy  09/08/2012   diverticulosis, hemorrhoids.  [name].  Symptoms:  anemia, hemoccult +.   ESOPHAGOGASTRODUODENOSCOPY  09/08/2012   +HH.  [name].  Symptoms: anemia, hemoccult +.   HIP ARTHROSCOPY Right    LAPAROSCOPIC GASTRIC SLEEVE RESECTION N/A 03/01/2016   Procedure: LAPAROSCOPIC GASTRIC SLEEVE RESECTION, UPPER ENDO;  Surgeon: [name], MD;  Location: [facility];  Service: General;  Laterality: N/A;   LAPAROSCOPIC LYSIS OF ADHESIONS N/A 02/10/2016   Procedure: LAPAROSCOPIC LYSIS OF ADHESIONS;  Surgeon: [name], MD;  Location: [facility];  Service: General;  Laterality: N/A;   LAPAROSCOPY N/A 02/10/2016   Procedure: LAPAROSCOPY DIAGNOSTIC;  Surgeon: [name], MD;  Location: [facility];  Service: General;  Laterality: N/A;   NASAL SEPTUM SURGERY     NECK SURGERY     Rheumatology Consult  11/26/2011   multiple arthralgias. Autoimmune labs negative.  Rx for Plaquenil for non-specific autoimmune process.  [name].   SHOULDER ARTHROSCOPY WITH OPEN ROTATOR CUFF REPAIR AND DISTAL CLAVICLE ACROMINECTOMY Right 01/13/2021   Procedure: SHOULDER ARTHROSCOPY WITH OPEN ROTATOR CUFF REPAIR AND DISTAL CLAVICLE ACROMINECTOMY;  Surgeon: Thornton Park, MD;  Location: [facility];  Service: Orthopedics;  Laterality: Right;   SHOULDER ARTHROSCOPY WITH SUBACROMIAL DECOMPRESSION Right 01/13/2021   Procedure: SHOULDER ARTHROSCOPY WITH SUBACROMIAL DECOMPRESSION, BICEPS TENOTOMY;  Surgeon: Thornton Park, MD;  Location: [facility];  Service: Orthopedics;  Laterality: Right;   SIGMOID RESECTION / RECTOPEXY     SPINE SURGERY     TONSILLECTOMY AND ADENOIDECTOMY     TOTAL HIP ARTHROPLASTY Right 09/27/2017   Procedure: TOTAL HIP ARTHROPLASTY ANTERIOR APPROACH;  Surgeon: [name], MD;  Location: [facility];  Service: Orthopedics;  Laterality: Right;   TOTAL HIP ARTHROPLASTY Left 12/27/2017   Procedure: TOTAL HIP ARTHROPLASTY ANTERIOR APPROACH;  Surgeon: [name], MD;  Location: [facility];  Service: Orthopedics;  Laterality: Left;    SOCIAL HISTORY: Social History   Socioeconomic History   Marital status: Married    Spouse name: Not on file   Number of children: 2   Years of education: 12   Highest education level: Not on file  Occupational History   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      Children: 2 sons (24, 30); 2 grandchildren  (73yo Atlast, 65 yo August)      Lives: with husband, [name]/son.      Employment: check in at [employer] in [place] x 2004; loves job.      Tobacco abuse:  Former smoker.      Alcohol:  None; holidays only      Exercise: none in 2017     Seatbelt: 100%; no  texting   Social Determinants of Health   Financial Resource Strain: Not on file  Food Insecurity: Not on file  Transportation Needs: Not on file  Physical Activity: Not on file  Stress: Not on file  Social Connections: Not on file  Intimate Partner Violence: Not on file    FAMILY HISTORY: Family History  Problem Relation Age of Onset   Bipolar disorder Father    Transient ischemic attack Father    Dementia Father        secondary to head trauma   Hypertension Father    Diabetes Father    CAD Father    Cancer Mother 83       lung   Migraines Mother    Hypothyroidism Mother    Hypertension Sister    Atrial fibrillation Sister    Hypothyroidism Sister    Alcohol abuse Brother    Hypertension Brother    Alcohol abuse Brother    Atrial fibrillation Maternal Grandmother    COPD Maternal Grandmother    Diabetes Maternal Grandmother    Hyperlipidemia Maternal Grandmother    Diabetes Maternal Grandfather    Diabetes Paternal Grandfather    Heart disease Paternal Grandfather    Breast cancer Sister 2    ALLERGIES:   is allergic to cephalexin, cephalosporins, and penicillins.  MEDICATIONS:  Current Outpatient Medications  Medication Sig Dispense Refill   albuterol (VENTOLIN HFA) 108 (90 Base) MCG/ACT inhaler Inhale 1-2 puffs into the lungs every 6 (six) hours as needed for wheezing or shortness of breath. 1 each 0   Azelastine-Fluticasone 137-50 MCG/ACT SUSP Place 2 sprays into the nose 2 (two) times a day. 23 g 0   budesonide-formoterol (SYMBICORT) 160-4.5 MCG/ACT inhaler INHALE 2 PUFFS BY MOUTH 2 TIMES DAILY 10.2 g 4   busPIRone (BUSPAR) 10 MG tablet Take 1 tablet (10 mg total) by mouth 3 (three) times daily (Patient taking differently: Take 10 mg by mouth daily as needed (anxiety).) 270 tablet 1   DULoxetine (CYMBALTA) 60 MG capsule Take 1 capsule (60 mg total) by mouth 2 (two) times daily 180 capsule 1   gabapentin (NEURONTIN) 300 MG capsule Take 1-3 capsules (300-900 mg total) by mouth 3 (three) times daily 810 capsule 3   HYDROcodone-acetaminophen (NORCO) 10-325 MG tablet Take 1 tablet by mouth every 4 (four) hours as needed for Pain 150 tablet 0   hydrocortisone 2.5 % cream Apply topically 2 (two) times daily as needed (Rash). At corners of mouth for up to one week. 20 g 0   ipratropium (ATROVENT) 0.03 % nasal spray Place 2 sprays into both nostrils every 12 (twelve) hours. 30 mL 0   ketoconazole (NIZORAL) 2 % shampoo apply three times per week, massage into scalp and leave in for 10 minutes before rinsing out (Patient taking differently: Apply 1 application topically every 14 (fourteen) days. massage into scalp and leave in for 10 minutes before rinsing out) 120 mL 11   levocetirizine (XYZAL) 5 MG tablet TAKE 1 TABLET BY MOUTH EVERY EVENING 90 tablet 3   methocarbamol (ROBAXIN) 750 MG tablet Take 750 mg by mouth 3 (three) times daily as needed for muscle spasms.     montelukast (SINGULAIR) 10 MG tablet Take 1 tablet (10 mg total) by mouth at bedtime (Patient taking differently: Take 10 mg by mouth in the  morning.) 90 tablet 1   mupirocin ointment (BACTROBAN) 2 % Apply 1 application topically 2 (two) times daily. To corners of mouth (Patient taking differently: Apply 1  application topically daily as needed (corners of mouth).) 22 g 2   nystatin cream (MYCOSTATIN) Apply two to three times per day as needed 30 g 3   ondansetron (ZOFRAN) 4 MG tablet Take 1 tablet (4 mg total) by mouth every 8 (eight) hours as needed for nausea or vomiting. 30 tablet 0   promethazine (PHENERGAN) 25 MG tablet Take 1 tablet (25 mg total) by mouth every 8 (eight) hours as needed for nausea 20 tablet 1   triamcinolone (KENALOG) 0.1 % paste Dry ulcer with tissue and then apply paste to affected area twice a day until no longer tender. 5 g 12   benzonatate (TESSALON) 100 MG capsule Take 1 capsule (100 mg total) by mouth 3 (three) times daily as needed. (Patient not taking: Reported on 03/27/2021) 30 capsule 0   ferrous [id number] C-folic acid (TRINSICON / FOLTRIN) capsule Take 1 capsule by mouth 2 (two) times daily after a meal. (Patient not taking: Reported on 01/02/2021) 120 capsule 1   fluconazole (DIFLUCAN) 150 MG tablet Take 1 tablet (150 mg total) by mouth daily. Take 1 by mouth. Repeat in 1 week if needed. (Patient not taking: Reported on 03/27/2021) 2 tablet 0   HYDROcodone-acetaminophen (NORCO) 10-325 MG tablet Take 1 tablet by mouth every 4 (four) hours as needed for Pain (Patient not taking: Reported on 03/27/2021) 150 tablet 0   HYDROcodone-acetaminophen (NORCO) 5-325 MG tablet Take 1 tablet every 12 hours by mouth as needed. (Patient not taking: Reported on 03/27/2021) 30 tablet 0   nitrofurantoin, macrocrystal-monohydrate, (MACROBID) 100 MG capsule Take 1 capsule (100 mg total) by mouth once daily as needed (Patient not taking: Reported on 03/27/2021) 90 capsule 1   oxyCODONE (OXY IR/ROXICODONE) 5 MG immediate release tablet Take 1 tablet (5 mg total) by mouth every 4 (four) hours as needed. (Patient not taking:  Reported on 03/27/2021) 40 tablet 0   promethazine-dextromethorphan (PROMETHAZINE-DM) 6.25-15 MG/5ML syrup Take 5 mLs by mouth 4 (four) times daily as needed for cough. (Patient not taking: Reported on 03/27/2021) 118 mL 0   No current facility-administered medications for this visit.     PHYSICAL EXAMINATION: ECOG PERFORMANCE STATUS: 1 - Symptomatic but completely ambulatory Vitals:   03/27/21 1000  BP: 138/85  Pulse: 96  Resp: 18  Temp: 98.4 F (36.9 C)   Filed Weights   03/27/21 1000  Weight: 202 lb 6.4 oz (91.8 kg)    Physical Exam Constitutional:      General: She is not in acute distress.    Appearance: She is obese.  HENT:     Head: Normocephalic and atraumatic.  Eyes:     General: No scleral icterus. Cardiovascular:     Rate and Rhythm: Normal rate and regular rhythm.     Heart sounds: Normal heart sounds.  Pulmonary:     Effort: Pulmonary effort is normal. No respiratory distress.     Breath sounds: No wheezing.  Abdominal:     General: Bowel sounds are normal. There is no distension.     Palpations: Abdomen is soft.  Musculoskeletal:        General: No deformity. Normal range of motion.     Cervical back: Normal range of motion and neck supple.  Skin:    General: Skin is warm and dry.     Findings: No erythema or rash.  Neurological:     Mental Status: She is alert and oriented to person, place, and time. Mental status is at baseline.  Cranial Nerves: No cranial nerve deficit.     Coordination: Coordination normal.  Psychiatric:        Mood and Affect: Mood normal.    LABORATORY DATA:  I have reviewed the data as listed Lab Results  Component Value Date   WBC 6.3 03/27/2021   HGB 15.2 (H) 03/27/2021   HCT 46.7 (H) 03/27/2021   MCV 91.2 03/27/2021   PLT 311 03/27/2021   Recent Labs    09/17/20 1349 09/18/20 0417 09/19/20 0337 01/08/21 1535 03/27/21 0939  NA 138   < > 140 136 139  K 3.6   < > 3.8 3.9 4.3  CL 107   < > 106 104 104  CO2 25    < > 29 23 29   GLUCOSE 110*   < > 145* 144* 147*  BUN 15   < > 12 18 7*  CREATININE 0.81   < > 0.58 1.51* 0.56  CALCIUM 8.2*   < > 8.4* 9.5 9.3  GFRNONAA >60   < > >60 38* >60  PROT 6.0*  --   --  6.8 7.3  ALBUMIN 3.7  --   --  4.8 4.2  AST 26  --   --  16 19  ALT 17  --   --  10 12  ALKPHOS 64  --   --  57 58  BILITOT 1.1  --   --  1.0 0.1*  BILIDIR  --   --   --  <0.1  --   IBILI  --   --   --  NOT CALCULATED  --    < > = values in this interval not displayed.    Iron/TIBC/Ferritin/ %Sat    Component Value Date/Time   IRON 43 01/08/2021 1535   IRON 92 04/30/2017 1055   TIBC 312 01/08/2021 1535   TIBC 264 04/27/2016 1744   FERRITIN 286 01/08/2021 1535   IRONPCTSAT 14 01/08/2021 1535   IRONPCTSAT 16 04/27/2016 1744   IRONPCTSAT 41 09/17/2013 0853       RADIOGRAPHIC STUDIES: I have personally reviewed the radiological images as listed and agreed with the findings in the report. No results found.    ASSESSMENT & PLAN:  1. 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HCC)   2. History of gastric bypass   3. Stomach pain    #history of hemolytic Anemia Resolve. Hemoglobin is high today at 15.2.   History of gastric bypass, monitor counts.  She is at risk of vitamin deficiency.   Iron panel showed adequate iron store.   Stomach pain and dark stool, recent NSAIDS use.  Recommend trials of OTC omeprazole. Recommend her to follow up with GI for further evaluation. Avoid NSAIDS  Follow-up  3 months  Orders Placed This Encounter  Procedures   CBC with Differential/Platelet    Standing Status:   Future    Standing Expiration Date:   03/28/2022   Comprehensive metabolic panel    Standing Status:   Future    Standing Expiration Date:   03/27/2022   Haptoglobin    Standing Status:   Future    Standing Expiration Date:   03/28/2022   Lactate dehydrogenase    Standing Status:   Future    Standing Expiration Date:   03/28/2022    All questions were answered. The patient knows to call the  clinic with any problems questions or concerns.  cc [name], MD   Thank you for this kind referral and the  opportunity to participate in the care of this patient. A copy of today's note is routed to referring provider    Earlie Server, MD, PhD Hematology Oncology   03/27/2021

## 2021-03-27 NOTE — Progress Notes (Signed)
Patient reports that last week had a pain in her abdomen which she descirbes as "Knife stabbing pain" and bloating. She also reports small amount of blood in her stool this week.  ?

## 2021-03-28 ENCOUNTER — Encounter: Payer: Self-pay | Admitting: Emergency Medicine

## 2021-03-28 ENCOUNTER — Telehealth: Payer: HMO | Admitting: Emergency Medicine

## 2021-03-28 DIAGNOSIS — H5789 Other specified disorders of eye and adnexa: Secondary | ICD-10-CM

## 2021-03-28 MED ORDER — OPCON-A 0.027-0.315 % OP SOLN: 1.0000 [drp] | Freq: Four times a day (QID) | OPHTHALMIC | 0 refills | Status: DC

## 2021-03-28 MED ORDER — POLYMYXIN B-TRIMETHOPRIM 10000-0.1 UNIT/ML-% OP SOLN: 2.0000 [drp] | Freq: Four times a day (QID) | OPHTHALMIC | 0 refills | Status: AC

## 2021-03-28 NOTE — Progress Notes (Signed)
E-Visit for Pink Eye ? ? ?We are sorry that you are not feeling well.  Here is how we plan to help! ? ?Based on what you have shared with me it looks like you have conjunctivitis.  Conjunctivitis is a common inflammatory or infectious condition of the eye that is often referred to as "pink eye".  In most cases it is contagious (viral or bacterial). However, not all conjunctivitis requires antibiotics (ex. Allergic).  We have made appropriate suggestions for you based upon your presentation. ? ?I have prescribed Polytrim Ophthalmic drops 1-2 drops 4 times a day times 5 days and I recommend that you use OpconA, 1-2 drops every 4-6 hours (an over the counter allergy drop available at your local pharmacy).  Your pharmacist may have an alternative suggestion. ? ?Pink eye can be highly contagious.  It is typically spread through direct contact with secretions, or contaminated objects or surfaces that one may have touched.  Strict handwashing is suggested with soap and water is urged.  If not available, use alcohol based had sanitizer.  Avoid unnecessary touching of the eye.  If you wear contact lenses, you will need to refrain from wearing them until you see no white discharge from the eye for at least 24 hours after being on medication.  You should see symptom improvement in 1-2 days after starting the medication regimen.  Call us if symptoms are not improved in 1-2 days. ? ?Home Care: ?Wash your hands often! ?Do not wear your contacts until you complete your treatment plan. ?Avoid sharing towels, bed linen, personal items with a person who has pink eye. ?See attention for anyone in your home with similar symptoms. ? ?Get Help Right Away If: ?Your symptoms do not improve. ?You develop blurred or loss of vision. ?Your symptoms worsen (increased discharge, pain or redness) ? ? ?Thank you for choosing an e-visit. ? ?Your e-visit answers were reviewed by a board certified advanced clinical practitioner to complete your  personal care plan. Depending upon the condition, your plan could have included both over the counter or prescription medications. ? ?Please review your pharmacy choice. Make sure the pharmacy is open so you can pick up prescription now. If there is a problem, you may contact your provider through [employer] and have the prescription routed to another pharmacy.  Your safety is important to [place]. If you have drug allergies check your prescription carefully.  ? ?For the next 24 hours you can use MyChart to ask questions about today's visit, request a non-urgent call back, or ask for a work or school excuse. ?You will get an email in the next two days asking about your experience. I hope that your e-visit has been valuable and will speed your recovery. ?  ?

## 2021-03-28 NOTE — Progress Notes (Signed)
I have spent 5 minutes in review of e-visit questionnaire, review and updating patient chart, medical decision making and response to patient.   Ran Tullis, PA-C    

## 2021-03-31 LAB — COLD AGGLUTININ TITER: Cold Agglutinin Titer: NEGATIVE

## 2021-04-01 ENCOUNTER — Other Ambulatory Visit: Payer: Self-pay

## 2021-04-01 MED ORDER — PROMETHAZINE HCL 25 MG PO TABS
ORAL_TABLET | ORAL | 0 refills | Status: DC
  Filled 2021-04-01: qty 20, 6d supply, fill #0

## 2021-04-01 MED ORDER — HYDROCODONE-ACETAMINOPHEN 10-325 MG PO TABS
ORAL_TABLET | ORAL | 0 refills | Status: DC
  Filled 2021-04-03: qty 150, 25d supply, fill #0
  Filled ????-??-??: fill #0

## 2021-04-01 MED ORDER — LEVOCETIRIZINE DIHYDROCHLORIDE 5 MG PO TABS
5.0000 mg | ORAL_TABLET | Freq: Every evening | ORAL | 4 refills | Status: DC
  Filled 2021-04-01: qty 90, 90d supply, fill #0
  Filled 2021-10-06: qty 90, 90d supply, fill #1
  Filled 2022-03-03: qty 90, 90d supply, fill #2

## 2021-04-03 ENCOUNTER — Other Ambulatory Visit: Payer: Self-pay

## 2021-04-09 ENCOUNTER — Other Ambulatory Visit: Payer: Self-pay

## 2021-04-09 ENCOUNTER — Telehealth: Payer: HMO | Admitting: Physician Assistant

## 2021-04-09 DIAGNOSIS — J019 Acute sinusitis, unspecified: Secondary | ICD-10-CM | POA: Diagnosis not present

## 2021-04-09 DIAGNOSIS — B9689 Other specified bacterial agents as the cause of diseases classified elsewhere: Secondary | ICD-10-CM

## 2021-04-09 MED ORDER — DOXYCYCLINE HYCLATE 100 MG PO TABS
100.0000 mg | ORAL_TABLET | Freq: Two times a day (BID) | ORAL | 0 refills | Status: DC
  Filled 2021-04-09: qty 20, 10d supply, fill #0

## 2021-04-09 MED ORDER — CLARITHROMYCIN 500 MG PO TABS
500.0000 mg | ORAL_TABLET | Freq: Two times a day (BID) | ORAL | 0 refills | Status: AC
  Filled 2021-04-09: qty 20, 10d supply, fill #0

## 2021-04-09 NOTE — Addendum Note (Signed)
Addended by: Brunetta Jeans on: 04/09/2021 09:15 AM ? ? Modules accepted: Orders ? ?

## 2021-04-09 NOTE — Progress Notes (Signed)
I have spent 5 minutes in review of e-visit questionnaire, review and updating patient chart, medical decision making and response to patient.   Chianne Byrns Cody Adelin Ventrella, PA-C    

## 2021-04-09 NOTE — Progress Notes (Signed)
E-Visit for Sinus Problems ? ?We are sorry that you are not feeling well.  Here is how we plan to help! ? ?Based on what you have shared with me it looks like you have sinusitis.  Sinusitis is inflammation and infection in the sinus cavities of the head.  Based on your presentation I believe you most likely have Acute Bacterial Sinusitis.  This is an infection caused by bacteria and is treated with antibiotics. I have prescribed Doxycycline '100mg'$  by mouth twice a day for 10 days. -- Since you were given Levaquin last go round and because Doxycycline has better coverage for sinusitis than Biaxin but still covers lungs as well.  You may use an oral decongestant such as Mucinex D or if you have glaucoma or high blood pressure use plain Mucinex. Saline nasal spray help and can safely be used as often as needed for congestion.  If you develop worsening sinus pain, fever or notice severe headache and vision changes, or if symptoms are not better after completion of antibiotic, please schedule an appointment with a health care provider.   ? ?Sinus infections are not as easily transmitted as other respiratory infection, however we still recommend that you avoid close contact with loved ones, especially the very young and elderly.  Remember to wash your hands thoroughly throughout the day as this is the number one way to prevent the spread of infection! ? ?Home Care: ?Only take medications as instructed by your medical team. ?Complete the entire course of an antibiotic. ?Do not take these medications with alcohol. ?A steam or ultrasonic humidifier can help congestion.  You can place a towel over your head and breathe in the steam from hot water coming from a faucet. ?Avoid close contacts especially the very young and the elderly. ?Cover your mouth when you cough or sneeze. ?Always remember to wash your hands. ? ?Get Help Right Away If: ?You develop worsening fever or sinus pain. ?You develop a severe head ache or visual  changes. ?Your symptoms persist after you have completed your treatment plan. ? ?Make sure you ?Understand these instructions. ?Will watch your condition. ?Will get help right away if you are not doing well or get worse. ? ?Thank you for choosing an e-visit. ? ?Your e-visit answers were reviewed by a board certified advanced clinical practitioner to complete your personal care plan. Depending upon the condition, your plan could have included both over the counter or prescription medications. ? ?Please review your pharmacy choice. Make sure the pharmacy is open so you can pick up prescription now. If there is a problem, you may contact your provider through [employer] and have the prescription routed to another pharmacy.  Your safety is important to [place]. If you have drug allergies check your prescription carefully.  ? ?For the next 24 hours you can use MyChart to ask questions about today's visit, request a non-urgent call back, or ask for a work or school excuse. ?You will get an email in the next two days asking about your experience. I hope that your e-visit has been valuable and will speed your recovery. ? ?

## 2021-04-14 ENCOUNTER — Other Ambulatory Visit: Payer: Self-pay

## 2021-04-14 MED ORDER — LEVOFLOXACIN 750 MG PO TABS: 750.0000 mg | ORAL_TABLET | Freq: Every day | ORAL | 0 refills | Status: AC

## 2021-04-27 ENCOUNTER — Other Ambulatory Visit: Payer: Self-pay

## 2021-04-27 MED ORDER — BUDESONIDE-FORMOTEROL FUMARATE 160-4.5 MCG/ACT IN AERO: INHALATION_SPRAY | RESPIRATORY_TRACT | 3 refills | Status: DC

## 2021-04-27 MED ORDER — HYDROCODONE-ACETAMINOPHEN 10-325 MG PO TABS
ORAL_TABLET | ORAL | 0 refills | Status: DC
  Filled 2021-05-01: qty 150, 30d supply, fill #0

## 2021-04-27 MED ORDER — BUDESONIDE-FORMOTEROL FUMARATE 160-4.5 MCG/ACT IN AERO
INHALATION_SPRAY | RESPIRATORY_TRACT | 2 refills | Status: DC
  Filled 2021-04-27: qty 10.2, 30d supply, fill #0
  Filled 2021-08-13: qty 10.2, 30d supply, fill #1
  Filled 2021-10-13 – 2022-03-03 (×3): qty 10.2, 30d supply, fill #2

## 2021-04-28 ENCOUNTER — Other Ambulatory Visit: Payer: Self-pay

## 2021-05-01 ENCOUNTER — Other Ambulatory Visit: Payer: Self-pay

## 2021-05-15 ENCOUNTER — Telehealth: Payer: Self-pay | Admitting: *Deleted

## 2021-05-15 NOTE — Telephone Encounter (Signed)
Patient called reporting that she is having difficulty breathing and that she is being told by her coworkers that her skin is turning yellow. She is asking if she can at least have her labs checked. Please advise ?

## 2021-05-15 NOTE — Telephone Encounter (Signed)
Call returned to patient and got voice mail, left message that it is recommended that she go to the ER ?

## 2021-05-18 ENCOUNTER — Other Ambulatory Visit: Payer: Self-pay | Admitting: Oncology

## 2021-05-18 ENCOUNTER — Telehealth: Payer: Self-pay

## 2021-05-18 DIAGNOSIS — D599 Acquired hemolytic anemia, unspecified: Secondary | ICD-10-CM

## 2021-05-18 NOTE — Telephone Encounter (Signed)
Per secure chat from Dr. Tasia Catchings, please schedule lab/MD this week and inform pt of appt. Keep appts in June as scheduled.  ?

## 2021-05-19 ENCOUNTER — Encounter: Payer: Self-pay | Admitting: Oncology

## 2021-05-19 ENCOUNTER — Inpatient Hospital Stay: Payer: HMO | Attending: Oncology

## 2021-05-19 ENCOUNTER — Inpatient Hospital Stay (HOSPITAL_BASED_OUTPATIENT_CLINIC_OR_DEPARTMENT_OTHER): Payer: HMO | Admitting: Oncology

## 2021-05-19 VITALS — BP 129/72 | HR 94 | Temp 98.3°F | Resp 18 | Wt 208.7 lb

## 2021-05-19 DIAGNOSIS — R000 Tachycardia, unspecified: Secondary | ICD-10-CM | POA: Diagnosis not present

## 2021-05-19 DIAGNOSIS — R5383 Other fatigue: Secondary | ICD-10-CM

## 2021-05-19 DIAGNOSIS — Z86010 Personal history of colonic polyps: Secondary | ICD-10-CM | POA: Insufficient documentation

## 2021-05-19 DIAGNOSIS — E669 Obesity, unspecified: Secondary | ICD-10-CM | POA: Diagnosis not present

## 2021-05-19 DIAGNOSIS — E611 Iron deficiency: Secondary | ICD-10-CM | POA: Diagnosis not present

## 2021-05-19 DIAGNOSIS — Z818 Family history of other mental and behavioral disorders: Secondary | ICD-10-CM | POA: Diagnosis not present

## 2021-05-19 DIAGNOSIS — Z8349 Family history of other endocrine, nutritional and metabolic diseases: Secondary | ICD-10-CM | POA: Diagnosis not present

## 2021-05-19 DIAGNOSIS — Z823 Family history of stroke: Secondary | ICD-10-CM | POA: Insufficient documentation

## 2021-05-19 DIAGNOSIS — Z801 Family history of malignant neoplasm of trachea, bronchus and lung: Secondary | ICD-10-CM | POA: Insufficient documentation

## 2021-05-19 DIAGNOSIS — Z9884 Bariatric surgery status: Secondary | ICD-10-CM | POA: Diagnosis not present

## 2021-05-19 DIAGNOSIS — E538 Deficiency of other specified B group vitamins: Secondary | ICD-10-CM | POA: Diagnosis not present

## 2021-05-19 DIAGNOSIS — Z8249 Family history of ischemic heart disease and other diseases of the circulatory system: Secondary | ICD-10-CM | POA: Diagnosis not present

## 2021-05-19 DIAGNOSIS — R0602 Shortness of breath: Secondary | ICD-10-CM | POA: Insufficient documentation

## 2021-05-19 DIAGNOSIS — Z803 Family history of malignant neoplasm of breast: Secondary | ICD-10-CM | POA: Insufficient documentation

## 2021-05-19 DIAGNOSIS — Z79899 Other long term (current) drug therapy: Secondary | ICD-10-CM | POA: Diagnosis not present

## 2021-05-19 DIAGNOSIS — J329 Chronic sinusitis, unspecified: Secondary | ICD-10-CM

## 2021-05-19 DIAGNOSIS — Z833 Family history of diabetes mellitus: Secondary | ICD-10-CM | POA: Diagnosis not present

## 2021-05-19 DIAGNOSIS — Z811 Family history of alcohol abuse and dependence: Secondary | ICD-10-CM | POA: Insufficient documentation

## 2021-05-19 DIAGNOSIS — D599 Acquired hemolytic anemia, unspecified: Secondary | ICD-10-CM | POA: Insufficient documentation

## 2021-05-19 DIAGNOSIS — Z87891 Personal history of nicotine dependence: Secondary | ICD-10-CM | POA: Insufficient documentation

## 2021-05-19 LAB — COMPREHENSIVE METABOLIC PANEL
ALT: 9 U/L (ref 0–44)
AST: 12 U/L — ABNORMAL LOW (ref 15–41)
Albumin: 4.2 g/dL (ref 3.5–5.0)
Alkaline Phosphatase: 58 U/L (ref 38–126)
Anion gap: 4 — ABNORMAL LOW (ref 5–15)
BUN: 11 mg/dL (ref 8–23)
CO2: 27 mmol/L (ref 22–32)
Calcium: 8.9 mg/dL (ref 8.9–10.3)
Chloride: 106 mmol/L (ref 98–111)
Creatinine, Ser: 0.62 mg/dL (ref 0.44–1.00)
GFR, Estimated: >60 mL/min (ref >60)
Glucose, Bld: 109 mg/dL — ABNORMAL HIGH (ref 70–99)
Potassium: 3.6 mmol/L (ref 3.5–5.1)
Sodium: 137 mmol/L (ref 135–145)
Total Bilirubin: 0.5 mg/dL (ref 0.3–1.2)
Total Protein: 6.9 g/dL (ref 6.5–8.1)

## 2021-05-19 LAB — CBC WITH DIFFERENTIAL/PLATELET
Abs Immature Granulocytes: 0.04 10*3/uL (ref 0.00–0.07)
Basophils Absolute: 0 10*3/uL (ref 0.0–0.1)
Basophils Relative: 1 %
Eosinophils Absolute: 0.3 10*3/uL (ref 0.0–0.5)
Eosinophils Relative: 5 %
HCT: 33.5 % — ABNORMAL LOW (ref 36.0–46.0)
Hemoglobin: 10.5 g/dL — ABNORMAL LOW (ref 12.0–15.0)
Immature Granulocytes: 1 %
Lymphocytes Relative: 32 %
Lymphs Abs: 2.1 10*3/uL (ref 0.7–4.0)
MCH: 30.3 pg (ref 26.0–34.0)
MCHC: 31.3 g/dL (ref 30.0–36.0)
MCV: 96.8 fL (ref 80.0–100.0)
Monocytes Absolute: 0.5 10*3/uL (ref 0.1–1.0)
Monocytes Relative: 8 %
Neutro Abs: 3.5 10*3/uL (ref 1.7–7.7)
Neutrophils Relative %: 53 %
Platelets: 323 10*3/uL (ref 150–400)
RBC: 3.46 MIL/uL — ABNORMAL LOW (ref 3.87–5.11)
RDW: 15.7 % — ABNORMAL HIGH (ref 11.5–15.5)
WBC: 6.5 10*3/uL (ref 4.0–10.5)
nRBC: 0 % (ref 0.0–0.2)

## 2021-05-19 LAB — LACTATE DEHYDROGENASE: LDH: 168 U/L (ref 98–192)

## 2021-05-19 LAB — DAT, POLYSPECIFIC AHG (ARMC ONLY): Polyspecific AHG test: NEGATIVE

## 2021-05-19 NOTE — Progress Notes (Signed)
?Hematology/Oncology Progress note ?[phone] [phone] ?  ? ? ? ?Patient Care Team: ?[name], MD as PCP - General (Family Medicine) ?Ammie Dalton, Okey Regal, MD (Obstetrics and Gynecology) ? ?REFERRING PROVIDER: ?[name], MD  ?CHIEF COMPLAINTS/REASON FOR VISIT:  ?anemia ? ?HISTORY OF PRESENTING ILLNESS:  ? ?Bethany Scott is a  66 y.o.  female with PMH listed below was seen in consultation at the request of  Tamala Julian Renette Butters, MD  for evaluation of anemia ?Patient has a history of gastric bypass.  She has a history of iron deficiency anemia and folic acid deficiency. ?Patient was recently admitted from 09/17/2020 - 09/21/2020 for pneumonia.  Chest x-ray was concerning for bilateral opacities/pulmonary vascular congestion.  CTA was negative for PE but showed extensive right-sided infiltrate and groundglass opacities.  Concerning for pneumonia.  Reports was complicated by asthma exacerbation.  Patient was treated with antibiotics. ? ?She was also found to have macrocytic anemia, hemoglobin 6.6.  She received 2 units of PRBC and hemoglobin increased to 8.2 at discharge.  Patient's B12 and folate levels were adequae.. ? ?Patient reports feeling tired and fatigued.  Dozing off during football game yesterday.  Some shortness of breath with exertion as well ?Denies any bloody or black stool.  Denies abdominal pain, unintentional weight loss, night sweats or fever. ? ? ?INTERVAL HISTORY ?Bethany Scott is a 67 y.o. female who has above history reviewed by me today presents for follow up visit for anemia. ?Patient was seen by Dr. Lanney Gins last week.  And had tachycardia, shortness of breath ?Patient she had blood work done at [place] which showed hemoglobin has decreased to 11.  Haptoglobin less than 10. ?Today she had additional blood work done. ?She attributes to the tachycardia and shortness of breath to be secondary to anxiety.  Today heart rate is normal.  She reports that  her coworker has commented that her skin turned yellow a few days ago.  Today her skin color is better. ?She has had sinus infection recently and was treated with Levaquin.  Previously she has taken clarithromycin in the past for sinus treatments.  Husband is concerned about patient's frequent infection and decreased energy level. ? ?No significant weight loss since March 2023.  She has gained 5 pounds since then. ? ? ?Review of Systems  ?Constitutional:  Positive for fatigue. Negative for appetite change, chills and fever.  ?HENT:   Negative for hearing loss and voice change.   ?Eyes:  Negative for eye problems.  ?Respiratory:  Negative for chest tightness and cough.   ?Cardiovascular:  Negative for chest pain.  ?Gastrointestinal:  Negative for abdominal distention, abdominal pain and blood in stool.  ?Endocrine: Negative for hot flashes.  ?Genitourinary:  Negative for difficulty urinating and frequency.   ?Musculoskeletal:  Negative for arthralgias.  ?     Chronic aches and pains  ?Skin:  Negative for itching and rash.  ?Neurological:  Negative for extremity weakness.  ?Hematological:  Negative for adenopathy.  ?Psychiatric/Behavioral:  Negative for confusion.   ? ?MEDICAL HISTORY:  ?Past Medical History:  ?Diagnosis Date  ? Allergy   ? Anemia   ? Anxiety   ? Arthritis   ? OA Knee; non-specific autoimmune process followed by [facility]/Rheumatology.  ? Asthma   ? Blood transfusion without reported diagnosis YRS AGO  ? COVID-19 03/2018  ? Depression   ? Gastric ulcer, unspecified as acute or chronic, without mention of hemorrhage, perforation, or obstruction YRS AGO  ?  GERD (gastroesophageal reflux disease)   ? Hyperlipidemia   ? Hypertension   ? Migraine   ? hx of migraines  ? Obesity, unspecified   ? Other chronic cystitis   ? Personal history of colonic polyps   ? Pneumonia   ? PONV (postoperative nausea and vomiting)   ? NAUSEA, SCOPOLAMINE PATCH HELPED WITH LAST SURGERIES  ? Pre-diabetes   ? ? ?SURGICAL  HISTORY: ?Past Surgical History:  ?Procedure Laterality Date  ? ABDOMINAL HYSTERECTOMY  01/25/1994  ? uterine fibroids; DUB; cervical dysplasia; ovaries resected two years later.    ? APPENDECTOMY    ? BILATERAL OOPHORECTOMY  2001  ? CESAREAN SECTION    ? x 2  ? COLON SURGERY  YRS AGO  ? SIGMOIDECTOMY  ? Colonoscopy  09/08/2012  ? diverticulosis, hemorrhoids.  [name].  Symptoms:  anemia, hemoccult +.  ? ESOPHAGOGASTRODUODENOSCOPY  09/08/2012  ? +HH.  [name].  Symptoms: anemia, hemoccult +.  ? HIP ARTHROSCOPY Right   ? LAPAROSCOPIC GASTRIC SLEEVE RESECTION N/A 03/01/2016  ? Procedure: LAPAROSCOPIC GASTRIC SLEEVE RESECTION, UPPER ENDO;  Surgeon: [name], MD;  Location: [facility];  Service: General;  Laterality: N/A;  ? LAPAROSCOPIC LYSIS OF ADHESIONS N/A 02/10/2016  ? Procedure: LAPAROSCOPIC LYSIS OF ADHESIONS;  Surgeon: [name], MD;  Location: [facility];  Service: General;  Laterality: N/A;  ? LAPAROSCOPY N/A 02/10/2016  ? Procedure: LAPAROSCOPY DIAGNOSTIC;  Surgeon: [name], MD;  Location: [facility];  Service: General;  Laterality: N/A;  ? NASAL SEPTUM SURGERY    ? NECK SURGERY    ? Rheumatology Consult  11/26/2011  ? multiple arthralgias. Autoimmune labs negative.  Rx for Plaquenil for non-specific autoimmune process.  [name].  ? SHOULDER ARTHROSCOPY WITH OPEN ROTATOR CUFF REPAIR AND DISTAL CLAVICLE ACROMINECTOMY Right 01/13/2021  ? Procedure: SHOULDER ARTHROSCOPY WITH OPEN ROTATOR CUFF REPAIR AND DISTAL CLAVICLE ACROMINECTOMY;  Surgeon: Thornton Park, MD;  Location: [facility];  Service: Orthopedics;  Laterality: Right;  ? SHOULDER ARTHROSCOPY WITH SUBACROMIAL DECOMPRESSION Right 01/13/2021  ? Procedure: SHOULDER ARTHROSCOPY WITH SUBACROMIAL DECOMPRESSION, BICEPS TENOTOMY;  Surgeon: Thornton Park, MD;  Location: [facility];  Service: Orthopedics;  Laterality: Right;  ? SIGMOID RESECTION / RECTOPEXY    ? SPINE SURGERY    ? TONSILLECTOMY AND ADENOIDECTOMY    ? TOTAL HIP ARTHROPLASTY Right 09/27/2017  ?  Procedure: TOTAL HIP ARTHROPLASTY ANTERIOR APPROACH;  Surgeon: [name], MD;  Location: [facility];  Service: Orthopedics;  Laterality: Right;  ? TOTAL HIP ARTHROPLASTY Left 12/27/2017  ? Procedure: TOTAL HIP ARTHROPLASTY ANTERIOR APPROACH;  Surgeon: [name], MD;  Location: [facility];  Service: Orthopedics;  Laterality: Left;  ? ? ?SOCIAL HISTORY: ?Social History  ? ?Socioeconomic History  ? Marital status: Married  ?  Spouse name: Not on file  ? Number of children: 2  ? Years of education: 44  ? Highest education level: Not on file  ?Occupational History  ? 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  Employer: [employer]  ?  Comment: Dermatology office  ?Tobacco Use  ? Smoking status: Former  ?  Packs/day: 1.00  ?  Years: 5.00  ?  Pack years: 5.00  ?  Types: Cigarettes  ? Smokeless tobacco: Never  ? Tobacco comments:  ?  Quit 32 years ago  ?Vaping Use  ? Vaping Use: Never used  ?Substance and Sexual Activity  ? Alcohol use: Yes  ?  Comment: rare  ? Drug use: No  ? Sexual activity: Yes  ?  Birth control/protection: Post-menopausal, Surgical  ?Other Topics Concern  ? Not on file  ?Social History Narrative  ? Marital status: married since 53 years; happily married; no abuse.  ?    Children: 2 sons (24, 44); 2 grandchildren  (38yo [place], 33 yo August)  ?    Lives: with husband, [name]/son.  ?    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  ?    Tobacco abuse:  Former smoker.  ?    Alcohol:  None; holidays only  ?    Exercise: none in 2017  ?   Seatbelt: 100%; no texting  ? ?Social Determinants of Health  ? ?Financial Resource Strain: Not on file  ?Food Insecurity: Not on file  ?Transportation Needs: Not on file  ?Physical Activity: Not on file  ?Stress: Not on file  ?Social Connections: Not on file  ?Intimate Partner Violence: Not on file  ? ? ?FAMILY HISTORY: ?Family History  ?Problem Relation Age of Onset  ? Bipolar disorder Father   ? Transient ischemic attack  Father   ? Dementia Father   ?     secondary to head trauma  ? Hypertension Father   ? Diabetes Father   ? CAD Father   ? Cancer Mother 40  ?     lung  ? Migraines Mother   ? Hypothyroidism Mother   ? Hypertension

## 2021-05-20 LAB — HAPTOGLOBIN: Haptoglobin: <10 mg/dL — ABNORMAL LOW (ref 37–355)

## 2021-05-22 ENCOUNTER — Inpatient Hospital Stay: Payer: HMO

## 2021-05-22 DIAGNOSIS — J329 Chronic sinusitis, unspecified: Secondary | ICD-10-CM

## 2021-05-22 DIAGNOSIS — R5383 Other fatigue: Secondary | ICD-10-CM

## 2021-05-22 DIAGNOSIS — D599 Acquired hemolytic anemia, unspecified: Secondary | ICD-10-CM

## 2021-05-22 LAB — CBC WITH DIFFERENTIAL/PLATELET
Abs Immature Granulocytes: 0.04 10*3/uL (ref 0.00–0.07)
Basophils Absolute: 0.1 10*3/uL (ref 0.0–0.1)
Basophils Relative: 1 %
Eosinophils Absolute: 0.3 10*3/uL (ref 0.0–0.5)
Eosinophils Relative: 5 %
HCT: 35.6 % — ABNORMAL LOW (ref 36.0–46.0)
Hemoglobin: 11.3 g/dL — ABNORMAL LOW (ref 12.0–15.0)
Immature Granulocytes: 1 %
Lymphocytes Relative: 22 %
Lymphs Abs: 1.5 10*3/uL (ref 0.7–4.0)
MCH: 31.2 pg (ref 26.0–34.0)
MCHC: 31.7 g/dL (ref 30.0–36.0)
MCV: 98.3 fL (ref 80.0–100.0)
Monocytes Absolute: 0.5 10*3/uL (ref 0.1–1.0)
Monocytes Relative: 7 %
Neutro Abs: 4.4 10*3/uL (ref 1.7–7.7)
Neutrophils Relative %: 64 %
Platelets: 341 10*3/uL (ref 150–400)
RBC: 3.62 MIL/uL — ABNORMAL LOW (ref 3.87–5.11)
RDW: 14.8 % (ref 11.5–15.5)
WBC: 6.7 10*3/uL (ref 4.0–10.5)
nRBC: 0 % (ref 0.0–0.2)

## 2021-05-22 LAB — IRON AND TIBC
Iron: 107 ug/dL (ref 28–170)
Saturation Ratios: 31 % (ref 10.4–31.8)
TIBC: 347 ug/dL (ref 250–450)
UIBC: 240 ug/dL

## 2021-05-22 LAB — VITAMIN B12: Vitamin B-12: 494 pg/mL (ref 180–914)

## 2021-05-22 LAB — FERRITIN: Ferritin: 183 ng/mL (ref 11–307)

## 2021-05-22 LAB — PNH PROFILE (-HIGH SENSITIVITY)

## 2021-05-22 LAB — FOLATE: Folate: 18.8 ng/mL (ref >5.9)

## 2021-05-24 LAB — G-6-PD, QUANT, BLOOD AND RBC
G-6-PD, Quant: 367 U/10E12 RBC (ref 127–427)
RBC: 3.61 x10E6/uL — ABNORMAL LOW (ref 3.77–5.28)

## 2021-05-25 ENCOUNTER — Other Ambulatory Visit: Payer: Self-pay

## 2021-05-25 LAB — MISC LABCORP TEST (SEND OUT): Labcorp test code: 5595

## 2021-05-25 LAB — HGB FRACTIONATION CASCADE
Hgb A2: 2.2 % (ref 1.8–3.2)
Hgb A: 97.8 % (ref 96.4–98.8)
Hgb F: 0 % (ref 0.0–2.0)
Hgb S: 0 %

## 2021-05-26 ENCOUNTER — Other Ambulatory Visit: Payer: Self-pay

## 2021-05-26 ENCOUNTER — Encounter: Payer: Self-pay | Admitting: Oncology

## 2021-05-26 LAB — IMMUNOGLOBULINS A/E/G/M, SERUM
IgA: 120 mg/dL (ref 87–352)
IgE (Immunoglobulin E), Serum: 35 IU/mL (ref 6–495)
IgG (Immunoglobin G), Serum: 916 mg/dL (ref 586–1602)
IgM (Immunoglobulin M), Srm: 125 mg/dL (ref 26–217)

## 2021-05-26 NOTE — Telephone Encounter (Signed)
Please advise 

## 2021-05-27 ENCOUNTER — Other Ambulatory Visit: Payer: Self-pay

## 2021-05-28 ENCOUNTER — Other Ambulatory Visit: Payer: Self-pay

## 2021-05-28 ENCOUNTER — Telehealth: Payer: Self-pay

## 2021-05-28 DIAGNOSIS — D599 Acquired hemolytic anemia, unspecified: Secondary | ICD-10-CM

## 2021-05-28 MED ORDER — MONTELUKAST SODIUM 10 MG PO TABS
10.0000 mg | ORAL_TABLET | Freq: Every day | ORAL | 4 refills | Status: DC
  Filled 2021-05-28 – 2021-10-27 (×3): qty 90, 90d supply, fill #0
  Filled 2022-03-03: qty 90, 90d supply, fill #1

## 2021-05-28 MED ORDER — PROMETHAZINE HCL 25 MG PO TABS
ORAL_TABLET | ORAL | 0 refills | Status: DC
  Filled 2021-05-28: qty 20, 6d supply, fill #0

## 2021-05-28 MED ORDER — HYDROCODONE-ACETAMINOPHEN 10-325 MG PO TABS
ORAL_TABLET | ORAL | 0 refills | Status: DC
  Filled 2021-05-29: qty 150, 30d supply, fill #0

## 2021-05-28 NOTE — Telephone Encounter (Signed)
-----   Message from Earlie Server, MD sent at 05/28/2021  3:15 PM EDT ----- ?Please let patient know that her hemoglobin on 4/28 has improved to 11s. 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 I recommend repeating some test when her hemolysis is totally over in the future.   ?Please schedule her to do blood work- order cbc, ldh, haptoglobin, G6PD in 1 week, and 2 weeks.  ?Follow up with me lab - same labs, same day with me in 4 weeks. Thanks.  ? ?

## 2021-05-28 NOTE — Telephone Encounter (Signed)
Patient informed via Mychart message. Please contact pt to schedule labs in 1 week and 2 week. 4 week follow up already scheduled.  ?

## 2021-05-29 ENCOUNTER — Other Ambulatory Visit: Payer: Self-pay

## 2021-06-03 ENCOUNTER — Inpatient Hospital Stay: Payer: PPO | Attending: Oncology

## 2021-06-03 DIAGNOSIS — D599 Acquired hemolytic anemia, unspecified: Secondary | ICD-10-CM | POA: Diagnosis not present

## 2021-06-03 LAB — CBC WITH DIFFERENTIAL/PLATELET
Abs Immature Granulocytes: 0.02 10*3/uL (ref 0.00–0.07)
Basophils Absolute: 0 10*3/uL (ref 0.0–0.1)
Basophils Relative: 1 %
Eosinophils Absolute: 0.4 10*3/uL (ref 0.0–0.5)
Eosinophils Relative: 7 %
HCT: 32.7 % — ABNORMAL LOW (ref 36.0–46.0)
Hemoglobin: 10.1 g/dL — ABNORMAL LOW (ref 12.0–15.0)
Immature Granulocytes: 0 %
Lymphocytes Relative: 43 %
Lymphs Abs: 2.7 10*3/uL (ref 0.7–4.0)
MCH: 32.2 pg (ref 26.0–34.0)
MCHC: 30.9 g/dL (ref 30.0–36.0)
MCV: 104.1 fL — ABNORMAL HIGH (ref 80.0–100.0)
Monocytes Absolute: 0.4 10*3/uL (ref 0.1–1.0)
Monocytes Relative: 7 %
Neutro Abs: 2.6 10*3/uL (ref 1.7–7.7)
Neutrophils Relative %: 42 %
Platelets: 261 10*3/uL (ref 150–400)
RBC: 3.14 MIL/uL — ABNORMAL LOW (ref 3.87–5.11)
RDW: 14.2 % (ref 11.5–15.5)
WBC: 6.2 10*3/uL (ref 4.0–10.5)
nRBC: 0 % (ref 0.0–0.2)

## 2021-06-03 LAB — LACTATE DEHYDROGENASE: LDH: 166 U/L (ref 98–192)

## 2021-06-04 ENCOUNTER — Encounter: Payer: Self-pay | Admitting: Oncology

## 2021-06-04 LAB — G-6-PD, QUANT, BLOOD AND RBC
G-6-PD, Quant: 390 U/10E12 RBC (ref 127–427)
RBC: 3.04 x10E6/uL — ABNORMAL LOW (ref 3.77–5.28)

## 2021-06-04 LAB — HAPTOGLOBIN: Haptoglobin: 16 mg/dL — ABNORMAL LOW (ref 37–355)

## 2021-06-05 NOTE — Telephone Encounter (Signed)
Please advise 

## 2021-06-10 ENCOUNTER — Inpatient Hospital Stay: Payer: PPO

## 2021-06-11 ENCOUNTER — Inpatient Hospital Stay: Payer: PPO

## 2021-06-11 ENCOUNTER — Telehealth: Payer: Self-pay | Admitting: Oncology

## 2021-06-11 NOTE — Telephone Encounter (Signed)
pt called in stating that she is sick and wanted to r/s appt.Bethany KitchenKJ

## 2021-06-12 ENCOUNTER — Other Ambulatory Visit: Payer: Self-pay

## 2021-06-13 ENCOUNTER — Other Ambulatory Visit: Payer: Self-pay

## 2021-06-15 ENCOUNTER — Other Ambulatory Visit: Payer: Self-pay

## 2021-06-15 ENCOUNTER — Inpatient Hospital Stay: Payer: PPO

## 2021-06-15 ENCOUNTER — Other Ambulatory Visit (HOSPITAL_COMMUNITY): Payer: Self-pay

## 2021-06-15 DIAGNOSIS — D599 Acquired hemolytic anemia, unspecified: Secondary | ICD-10-CM | POA: Diagnosis not present

## 2021-06-15 LAB — CBC WITH DIFFERENTIAL/PLATELET
Abs Immature Granulocytes: 0.01 10*3/uL (ref 0.00–0.07)
Basophils Absolute: 0 10*3/uL (ref 0.0–0.1)
Basophils Relative: 1 %
Eosinophils Absolute: 0.2 10*3/uL (ref 0.0–0.5)
Eosinophils Relative: 4 %
HCT: 33.2 % — ABNORMAL LOW (ref 36.0–46.0)
Hemoglobin: 10.8 g/dL — ABNORMAL LOW (ref 12.0–15.0)
Immature Granulocytes: 0 %
Lymphocytes Relative: 28 %
Lymphs Abs: 1.5 10*3/uL (ref 0.7–4.0)
MCH: 33.1 pg (ref 26.0–34.0)
MCHC: 32.5 g/dL (ref 30.0–36.0)
MCV: 101.8 fL — ABNORMAL HIGH (ref 80.0–100.0)
Monocytes Absolute: 0.3 10*3/uL (ref 0.1–1.0)
Monocytes Relative: 5 %
Neutro Abs: 3.3 10*3/uL (ref 1.7–7.7)
Neutrophils Relative %: 62 %
Platelets: 318 10*3/uL (ref 150–400)
RBC: 3.26 MIL/uL — ABNORMAL LOW (ref 3.87–5.11)
RDW: 12.7 % (ref 11.5–15.5)
WBC: 5.2 10*3/uL (ref 4.0–10.5)
nRBC: 0 % (ref 0.0–0.2)

## 2021-06-15 LAB — LACTATE DEHYDROGENASE: LDH: 168 U/L (ref 98–192)

## 2021-06-16 ENCOUNTER — Inpatient Hospital Stay: Payer: PPO

## 2021-06-16 LAB — HAPTOGLOBIN: Haptoglobin: 23 mg/dL — ABNORMAL LOW (ref 37–355)

## 2021-06-16 LAB — G-6-PD, QUANT, BLOOD AND RBC
G-6-PD, Quant: 367 U/10E12 RBC (ref 127–427)
RBC: 3.35 x10E6/uL — ABNORMAL LOW (ref 3.77–5.28)

## 2021-06-17 ENCOUNTER — Other Ambulatory Visit: Payer: Self-pay

## 2021-06-17 ENCOUNTER — Telehealth: Payer: Self-pay

## 2021-06-17 DIAGNOSIS — D599 Acquired hemolytic anemia, unspecified: Secondary | ICD-10-CM

## 2021-06-17 NOTE — Telephone Encounter (Signed)
-----   Message from Earlie Server, MD sent at 06/16/2021 11:07 PM EDT ----- Please let patient know that hemoglobin has slightly improved.  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  I recommend patient to repeat blood work in 2 weeks please check CBC, haptoglobin, G6PD quantification.  Thank you keep current appointment.

## 2021-06-17 NOTE — Telephone Encounter (Signed)
Spoke with patient and informed her of lab results and Dr. Collie Siad recommendation to repeat labs in 2 weeks at her appt on 6/5. Patient verbalized understanding.

## 2021-06-19 ENCOUNTER — Other Ambulatory Visit (HOSPITAL_COMMUNITY): Payer: Self-pay

## 2021-06-19 MED ORDER — DULOXETINE HCL 60 MG PO CPEP
ORAL_CAPSULE | ORAL | 3 refills | Status: DC
  Filled 2021-06-19: qty 180, 90d supply, fill #0
  Filled 2021-06-23 – 2021-09-09 (×2): qty 180, 90d supply, fill #1
  Filled 2021-12-22: qty 180, 90d supply, fill #2
  Filled 2022-03-15: qty 180, 90d supply, fill #3

## 2021-06-23 ENCOUNTER — Other Ambulatory Visit: Payer: Self-pay

## 2021-06-24 ENCOUNTER — Other Ambulatory Visit: Payer: Self-pay

## 2021-06-25 ENCOUNTER — Other Ambulatory Visit: Payer: Self-pay

## 2021-06-26 ENCOUNTER — Other Ambulatory Visit: Payer: Self-pay

## 2021-06-29 ENCOUNTER — Encounter: Payer: Self-pay | Admitting: Oncology

## 2021-06-29 ENCOUNTER — Inpatient Hospital Stay: Payer: HMO | Attending: Oncology

## 2021-06-29 ENCOUNTER — Inpatient Hospital Stay (HOSPITAL_BASED_OUTPATIENT_CLINIC_OR_DEPARTMENT_OTHER): Payer: HMO | Admitting: Oncology

## 2021-06-29 VITALS — BP 129/71 | HR 57 | Temp 98.3°F | Resp 18 | Wt 210.1 lb

## 2021-06-29 DIAGNOSIS — D599 Acquired hemolytic anemia, unspecified: Secondary | ICD-10-CM

## 2021-06-29 DIAGNOSIS — J329 Chronic sinusitis, unspecified: Secondary | ICD-10-CM | POA: Diagnosis not present

## 2021-06-29 DIAGNOSIS — D592 Drug-induced nonautoimmune hemolytic anemia: Secondary | ICD-10-CM | POA: Diagnosis present

## 2021-06-29 DIAGNOSIS — Z9884 Bariatric surgery status: Secondary | ICD-10-CM

## 2021-06-29 LAB — CBC WITH DIFFERENTIAL/PLATELET
Abs Immature Granulocytes: 0.01 10*3/uL (ref 0.00–0.07)
Basophils Absolute: 0.1 10*3/uL (ref 0.0–0.1)
Basophils Relative: 1 %
Eosinophils Absolute: 0.4 10*3/uL (ref 0.0–0.5)
Eosinophils Relative: 6 %
HCT: 36.8 % (ref 36.0–46.0)
Hemoglobin: 12.1 g/dL (ref 12.0–15.0)
Immature Granulocytes: 0 %
Lymphocytes Relative: 29 %
Lymphs Abs: 2 10*3/uL (ref 0.7–4.0)
MCH: 32 pg (ref 26.0–34.0)
MCHC: 32.9 g/dL (ref 30.0–36.0)
MCV: 97.4 fL (ref 80.0–100.0)
Monocytes Absolute: 0.5 10*3/uL (ref 0.1–1.0)
Monocytes Relative: 7 %
Neutro Abs: 3.8 10*3/uL (ref 1.7–7.7)
Neutrophils Relative %: 57 %
Platelets: 255 10*3/uL (ref 150–400)
RBC: 3.78 MIL/uL — ABNORMAL LOW (ref 3.87–5.11)
RDW: 11.9 % (ref 11.5–15.5)
WBC: 6.7 10*3/uL (ref 4.0–10.5)
nRBC: 0 % (ref 0.0–0.2)

## 2021-06-29 LAB — LACTATE DEHYDROGENASE: LDH: 132 U/L (ref 98–192)

## 2021-06-29 NOTE — Progress Notes (Signed)
Patient here for follow up. Pt reports feeling tired and without energy.

## 2021-06-29 NOTE — Progress Notes (Signed)
Hematology/Oncology Progress note [phone] [phone]      Patient Care Team: [name], MD as PCP - General (Family Medicine) Ammie Dalton, Okey Regal, MD (Obstetrics and Gynecology)  REFERRING PROVIDER: [name], MD  CHIEF COMPLAINTS/REASON FOR VISIT:  anemia  HISTORY OF PRESENTING ILLNESS:   Bethany Scott is a  66 y.o.  female with PMH listed below was seen in consultation at the request of  [name], MD  for evaluation of anemia Patient has a history of gastric bypass.  She has a history of iron deficiency anemia and folic acid deficiency. Patient was recently admitted from 09/17/2020 - 09/21/2020 for pneumonia.  Chest x-ray was concerning for bilateral opacities/pulmonary vascular congestion.  CTA was negative for PE but showed extensive right-sided infiltrate and groundglass opacities.  Concerning for pneumonia.  Reports was complicated by asthma exacerbation.  Patient was treated with antibiotics.  She was also found to have macrocytic anemia, hemoglobin 6.6.  She received 2 units of PRBC and hemoglobin increased to 8.2 at discharge.  Patient's B12 and folate levels were adequae..  Patient reports feeling tired and fatigued.  Dozing off during football game yesterday.  Some shortness of breath with exertion as well Denies any bloody or black stool.  Denies abdominal pain, unintentional weight loss, night sweats or fever.   INTERVAL HISTORY Bethany Scott is a 66 y.o. female who has above history reviewed by me today presents for follow up visit for anemia. Feeling well.  No new complaints.   Review of Systems  Constitutional:  Positive for fatigue. Negative for appetite change, chills and fever.  HENT:   Negative for hearing loss and voice change.   Eyes:  Negative for eye problems.  Respiratory:  Negative for chest tightness and cough.   Cardiovascular:  Negative for chest pain.  Gastrointestinal:  Negative for abdominal  distention, abdominal pain and blood in stool.  Endocrine: Negative for hot flashes.  Genitourinary:  Negative for difficulty urinating and frequency.   Musculoskeletal:  Negative for arthralgias.       Chronic aches and pains  Skin:  Negative for itching and rash.  Neurological:  Negative for extremity weakness.  Hematological:  Negative for adenopathy.  Psychiatric/Behavioral:  Negative for confusion.    MEDICAL HISTORY:  Past Medical History:  Diagnosis Date   Allergy    Anemia    Anxiety    Arthritis    OA Knee; non-specific autoimmune process followed by [facility]Rheumatology.   Asthma    Blood transfusion without reported diagnosis YRS AGO   COVID-19 03/2018   Depression    Gastric ulcer, unspecified as acute or chronic, without mention of hemorrhage, perforation, or obstruction YRS AGO   GERD (gastroesophageal reflux disease)    Hyperlipidemia    Hypertension    Migraine    hx of migraines   Obesity, unspecified    Other chronic cystitis    Personal history of colonic polyps    Pneumonia    PONV (postoperative nausea and vomiting)    NAUSEA, SCOPOLAMINE PATCH HELPED WITH LAST SURGERIES   Pre-diabetes     SURGICAL HISTORY: Past Surgical History:  Procedure Laterality Date   ABDOMINAL HYSTERECTOMY  01/25/1994   uterine fibroids; DUB; cervical dysplasia; ovaries resected two years later.     APPENDECTOMY     BILATERAL OOPHORECTOMY  2001   CESAREAN SECTION     x 2   COLON SURGERY  YRS AGO   SIGMOIDECTOMY  Colonoscopy  09/08/2012   diverticulosis, hemorrhoids.  [name].  Symptoms:  anemia, hemoccult +.   ESOPHAGOGASTRODUODENOSCOPY  09/08/2012   +HH.  [name].  Symptoms: anemia, hemoccult +.   HIP ARTHROSCOPY Right    LAPAROSCOPIC GASTRIC SLEEVE RESECTION N/A 03/01/2016   Procedure: LAPAROSCOPIC GASTRIC SLEEVE RESECTION, UPPER ENDO;  Surgeon: [name], MD;  Location: [facility];  Service: General;  Laterality: N/A;   LAPAROSCOPIC LYSIS OF ADHESIONS N/A  02/10/2016   Procedure: LAPAROSCOPIC LYSIS OF ADHESIONS;  Surgeon: [name], MD;  Location: [facility];  Service: General;  Laterality: N/A;   LAPAROSCOPY N/A 02/10/2016   Procedure: LAPAROSCOPY DIAGNOSTIC;  Surgeon: [name], MD;  Location: [facility];  Service: General;  Laterality: N/A;   NASAL SEPTUM SURGERY     NECK SURGERY     Rheumatology Consult  11/26/2011   multiple arthralgias. Autoimmune labs negative.  Rx for Plaquenil for non-specific autoimmune process.  [name].   SHOULDER ARTHROSCOPY WITH OPEN ROTATOR CUFF REPAIR AND DISTAL CLAVICLE ACROMINECTOMY Right 01/13/2021   Procedure: SHOULDER ARTHROSCOPY WITH OPEN ROTATOR CUFF REPAIR AND DISTAL CLAVICLE ACROMINECTOMY;  Surgeon: Thornton Park, MD;  Location: [facility];  Service: Orthopedics;  Laterality: Right;   SHOULDER ARTHROSCOPY WITH SUBACROMIAL DECOMPRESSION Right 01/13/2021   Procedure: SHOULDER ARTHROSCOPY WITH SUBACROMIAL DECOMPRESSION, BICEPS TENOTOMY;  Surgeon: Thornton Park, MD;  Location: [facility];  Service: Orthopedics;  Laterality: Right;   SIGMOID RESECTION / RECTOPEXY     SPINE SURGERY     TONSILLECTOMY AND ADENOIDECTOMY     TOTAL HIP ARTHROPLASTY Right 09/27/2017   Procedure: TOTAL HIP ARTHROPLASTY ANTERIOR APPROACH;  Surgeon: [name], MD;  Location: [facility];  Service: Orthopedics;  Laterality: Right;   TOTAL HIP ARTHROPLASTY Left 12/27/2017   Procedure: TOTAL HIP ARTHROPLASTY ANTERIOR APPROACH;  Surgeon: [name], MD;  Location: [facility];  Service: Orthopedics;  Laterality: Left;    SOCIAL HISTORY: Social History   Socioeconomic History   Marital status: Married    Spouse name: Not on file   Number of children: 2   Years of education: 12   Highest education level: Not on file  Occupational History   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      Children: 2 sons (24, 41); 2 grandchildren  (54yo Atlast, 31 yo August)      Lives: with husband, [name]/son.      Employment: check in at [employer] in [place] x 2004; loves job.      Tobacco abuse:  Former smoker.      Alcohol:  None; holidays only      Exercise: none in 2017     Seatbelt: 100%; no texting   Social Determinants of Health   Financial Resource Strain: Not on file  Food Insecurity: Not on  file  Transportation Needs: Not on file  Physical Activity: Not on file  Stress: Not on file  Social Connections: Not on file  Intimate Partner Violence: Not on file    FAMILY HISTORY: Family History  Problem Relation Age of Onset   Bipolar disorder Father    Transient ischemic attack Father    Dementia Father        secondary to head trauma   Hypertension Father    Diabetes Father    CAD Father    Cancer Mother 5       lung   Migraines Mother    Hypothyroidism Mother    Hypertension Sister    Atrial fibrillation Sister    Hypothyroidism Sister    Alcohol abuse Brother    Hypertension Brother    Alcohol abuse Brother    Atrial fibrillation Maternal Grandmother    COPD Maternal Grandmother    Diabetes Maternal Grandmother    Hyperlipidemia Maternal Grandmother    Diabetes Maternal Grandfather    Diabetes Paternal Grandfather    Heart disease Paternal Grandfather    Breast cancer Sister 91    ALLERGIES:  is allergic to cephalexin, cephalosporins, penicillins, and levaquin [levofloxacin].  MEDICATIONS:  Current  Outpatient Medications  Medication Sig Dispense Refill   albuterol (VENTOLIN HFA) 108 (90 Base) MCG/ACT inhaler Inhale 1-2 puffs into the lungs every 6 (six) hours as needed for wheezing or shortness of breath. 1 each 0   Azelastine-Fluticasone 137-50 MCG/ACT SUSP Place 2 sprays into the nose 2 (two) times a day. 23 g 0   budesonide-formoterol (SYMBICORT) 160-4.5 MCG/ACT inhaler INHALE 2 PUFFS BY MOUTH 2 TIMES DAILY 10.2 g 2   busPIRone (BUSPAR) 10 MG tablet Take 1 tablet (10 mg total) by mouth 3 (three) times daily (Patient taking differently: Take 10 mg by mouth daily as needed (anxiety).) 270 tablet 1   DULoxetine (CYMBALTA) 60 MG capsule Take 1 capsule (60 mg total) by mouth 2 (two) times daily 180 capsule 3   gabapentin (NEURONTIN) 300 MG capsule Take 1-3 capsules (300-900 mg total) by mouth 3 (three) times daily 810 capsule 3   HYDROcodone-acetaminophen (NORCO) 10-325 MG tablet Take 1 tablet by mouth every 4 (four) hours as needed for Pain 150 tablet 0   HYDROcodone-acetaminophen (NORCO) 10-325 MG tablet Take 1 tablet by mouth every 4 (four) hours as needed for Pain 150 tablet 0   ipratropium (ATROVENT) 0.03 % nasal spray Place 2 sprays into both nostrils every 12 (twelve) hours. 30 mL 0   ketoconazole (NIZORAL) 2 % shampoo apply three times per week, massage into scalp and leave in for 10 minutes before rinsing out (Patient taking differently: Apply 1 application. topically every 14 (fourteen) days. massage into scalp and leave in for 10 minutes before rinsing out) 120 mL 11   levocetirizine (XYZAL) 5 MG tablet Take 1 tablet (5 mg total) by mouth every evening 90 tablet 4   methocarbamol (ROBAXIN) 750 MG tablet Take 750 mg by mouth 3 (three) times daily as needed for muscle spasms.     montelukast (SINGULAIR) 10 MG tablet Take 1 tablet (10 mg total) by mouth at bedtime 90 tablet 4   Naphazoline-Pheniramine (OPCON-A) 0.027-0.315 % SOLN Apply 1 drop to eye 4 (four) times daily. 15 mL 0    promethazine (PHENERGAN) 25 MG tablet Take 1 tablet (25 mg total) by mouth every 8 (eight) hours as needed for Nausea 20 tablet 0   No current facility-administered medications for this visit.  PHYSICAL EXAMINATION: ECOG PERFORMANCE STATUS: 1 - Symptomatic but completely ambulatory Vitals:   06/29/21 1311  BP: 129/71  Pulse: (!) 57  Resp: 18  Temp: 98.3 F (36.8 C)   Filed Weights   06/29/21 1311  Weight: 210 lb 1.6 oz (95.3 kg)    Physical Exam Constitutional:      General: She is not in acute distress.    Appearance: She is obese.  HENT:     Head: Normocephalic and atraumatic.  Eyes:     General: No scleral icterus. Cardiovascular:     Rate and Rhythm: Normal rate and regular rhythm.     Heart sounds: Normal heart sounds.  Pulmonary:     Effort: Pulmonary effort is normal. No respiratory distress.     Breath sounds: No wheezing.  Abdominal:     General: Bowel sounds are normal. There is no distension.     Palpations: Abdomen is soft.  Musculoskeletal:        General: No deformity. Normal range of motion.     Cervical back: Normal range of motion and neck supple.  Skin:    General: Skin is warm and dry.     Findings: No erythema or rash.  Neurological:     Mental Status: She is alert and oriented to person, place, and time. Mental status is at baseline.     Cranial Nerves: No cranial nerve deficit.     Coordination: Coordination normal.  Psychiatric:        Mood and Affect: Mood normal.    LABORATORY DATA:  I have reviewed the data as listed Lab Results  Component Value Date   WBC 6.7 06/29/2021   HGB 12.1 06/29/2021   HCT 36.8 06/29/2021   MCV 97.4 06/29/2021   PLT 255 06/29/2021   Recent Labs    01/08/21 1535 03/27/21 0939 05/19/21 1442  NA 136 139 137  K 3.9 4.3 3.6  CL 104 104 106  CO2 '23 29 27  '$ GLUCOSE 144* 147* 109*  BUN 18 7* 11  CREATININE 1.51* 0.56 0.62  CALCIUM 9.5 9.3 8.9  GFRNONAA 38* >60 >60  PROT 6.8 7.3 6.9  ALBUMIN 4.8  4.2 4.2  AST 16 19 12*  ALT '10 12 9  '$ ALKPHOS 57 58 58  BILITOT 1.0 0.1* 0.5  BILIDIR <0.1  --   --   IBILI NOT CALCULATED  --   --     Iron/TIBC/Ferritin/ %Sat    Component Value Date/Time   IRON 107 05/22/2021 1046   IRON 92 04/30/2017 1055   TIBC 347 05/22/2021 1046   TIBC 264 04/27/2016 1744   FERRITIN 183 05/22/2021 1046   IRONPCTSAT 31 05/22/2021 1046   IRONPCTSAT 16 04/27/2016 1744   IRONPCTSAT 41 09/17/2013 0853       RADIOGRAPHIC STUDIES: I have personally reviewed the radiological images as listed and agreed with the findings in the report. No results found.    ASSESSMENT & PLAN:  1. 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HCC)   2. History of gastric bypass   3. 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  Hemolysis has normalized. DAT is negative.  Haptoglobin decreased.  LDH is normal.  Bilirubin is normal at 0.5.  PNH is negative CRP negative, ANA negative.  Hemoglobin is within normal limit indicating extravascular hemolysis-consistent with drug induced hemolysis... No hemoglobinopathy.  Normal B12, folate, iron tibc ferritin, G6-PD level Recurrent hemolytic anemia, 2 flare episodes occurred after Levaquin use for  infection.  Recommend patient to avoid quinolone specially Levaquin.  Medication was added to adverse reaction list. G6PD level has been normal.  Today's level is pending.  If level is normal at baseline, less likely G6PD deficiency.   History of gastric bypass, monitor counts.  She is at risk of vitamin deficiency.   01/09/2019 iron panel showed adequate iron store.  Normal vitamin B12 and folate level.  Continue monitoring.  Frequent sinus infection.  Patient has normal immunoglobulin levels.  Follow-up 4 months.  Orders Placed This Encounter  Procedures   CBC with Differential/Platelet    Standing Status:   Future    Standing Expiration Date:   06/30/2022   Comprehensive metabolic panel    Standing  Status:   Future    Standing Expiration Date:   06/30/2022   Haptoglobin    Standing Status:   Future    Standing Expiration Date:   06/30/2022   G-6-PD, Quant, Blood And RBC    Standing Status:   Future    Standing Expiration Date:   06/30/2022   Lactate dehydrogenase    Standing Status:   Future    Standing Expiration Date:   06/30/2022    All questions were answered. The patient knows to call the clinic with any problems questions or concerns.  cc [name], MD    Earlie Server, MD, PhD Hematology Oncology   06/29/2021

## 2021-06-30 LAB — G-6-PD, QUANT, BLOOD AND RBC
G-6-PD, Quant: 396 U/10E12 RBC (ref 127–427)
RBC: 3.71 x10E6/uL — ABNORMAL LOW (ref 3.77–5.28)

## 2021-06-30 LAB — HAPTOGLOBIN: Haptoglobin: 122 mg/dL (ref 37–355)

## 2021-07-09 ENCOUNTER — Emergency Department: Payer: PPO

## 2021-07-09 ENCOUNTER — Inpatient Hospital Stay
Admission: EM | Admit: 2021-07-09 | Discharge: 2021-07-11 | DRG: 812 | Disposition: A | Discharge status: Home / Self Care | Payer: PPO | Attending: Internal Medicine | Admitting: Internal Medicine

## 2021-07-09 ENCOUNTER — Encounter: Payer: Self-pay | Admitting: Emergency Medicine

## 2021-07-09 DIAGNOSIS — Z825 Family history of asthma and other chronic lower respiratory diseases: Secondary | ICD-10-CM

## 2021-07-09 DIAGNOSIS — Z87891 Personal history of nicotine dependence: Secondary | ICD-10-CM

## 2021-07-09 DIAGNOSIS — T378X5A Adverse effect of other specified systemic anti-infectives and antiparasitics, initial encounter: Secondary | ICD-10-CM | POA: Diagnosis present

## 2021-07-09 DIAGNOSIS — Z83438 Family history of other disorder of lipoprotein metabolism and other lipidemia: Secondary | ICD-10-CM

## 2021-07-09 DIAGNOSIS — D748 Other methemoglobinemias: Principal | ICD-10-CM | POA: Diagnosis present

## 2021-07-09 DIAGNOSIS — Z8616 Personal history of COVID-19: Secondary | ICD-10-CM

## 2021-07-09 DIAGNOSIS — Z803 Family history of malignant neoplasm of breast: Secondary | ICD-10-CM

## 2021-07-09 DIAGNOSIS — Z8249 Family history of ischemic heart disease and other diseases of the circulatory system: Secondary | ICD-10-CM

## 2021-07-09 DIAGNOSIS — E119 Type 2 diabetes mellitus without complications: Secondary | ICD-10-CM | POA: Diagnosis present

## 2021-07-09 DIAGNOSIS — D749 Methemoglobinemia, unspecified: Secondary | ICD-10-CM | POA: Diagnosis not present

## 2021-07-09 DIAGNOSIS — I10 Essential (primary) hypertension: Secondary | ICD-10-CM | POA: Diagnosis present

## 2021-07-09 DIAGNOSIS — E7800 Pure hypercholesterolemia, unspecified: Secondary | ICD-10-CM | POA: Diagnosis present

## 2021-07-09 DIAGNOSIS — R0902 Hypoxemia: Secondary | ICD-10-CM | POA: Diagnosis present

## 2021-07-09 DIAGNOSIS — Z79899 Other long term (current) drug therapy: Secondary | ICD-10-CM

## 2021-07-09 DIAGNOSIS — K219 Gastro-esophageal reflux disease without esophagitis: Secondary | ICD-10-CM | POA: Diagnosis present

## 2021-07-09 DIAGNOSIS — Z90710 Acquired absence of both cervix and uterus: Secondary | ICD-10-CM

## 2021-07-09 DIAGNOSIS — J45909 Unspecified asthma, uncomplicated: Secondary | ICD-10-CM | POA: Diagnosis present

## 2021-07-09 DIAGNOSIS — Z7951 Long term (current) use of inhaled steroids: Secondary | ICD-10-CM

## 2021-07-09 DIAGNOSIS — Z833 Family history of diabetes mellitus: Secondary | ICD-10-CM

## 2021-07-09 DIAGNOSIS — Z96643 Presence of artificial hip joint, bilateral: Secondary | ICD-10-CM | POA: Diagnosis present

## 2021-07-09 LAB — CBC WITH DIFFERENTIAL/PLATELET
Abs Immature Granulocytes: 0 10*3/uL (ref 0.00–0.07)
Basophils Absolute: 0 10*3/uL (ref 0.0–0.1)
Basophils Relative: 0 %
Eosinophils Absolute: 0.5 10*3/uL (ref 0.0–0.5)
Eosinophils Relative: 7 %
HCT: 41.1 % (ref 36.0–46.0)
Hemoglobin: 13 g/dL (ref 12.0–15.0)
Lymphocytes Relative: 50 %
Lymphs Abs: 3.8 10*3/uL (ref 0.7–4.0)
MCH: 31.1 pg (ref 26.0–34.0)
MCHC: 31.6 g/dL (ref 30.0–36.0)
MCV: 98.3 fL (ref 80.0–100.0)
Monocytes Absolute: 0.5 10*3/uL (ref 0.1–1.0)
Monocytes Relative: 6 %
Neutro Abs: 2.8 10*3/uL (ref 1.7–7.7)
Neutrophils Relative %: 37 %
Platelets: 285 10*3/uL (ref 150–400)
RBC: 4.18 MIL/uL (ref 3.87–5.11)
RDW: 12.6 % (ref 11.5–15.5)
WBC: 7.6 10*3/uL (ref 4.0–10.5)
nRBC: 0 % (ref 0.0–0.2)

## 2021-07-09 LAB — COMPREHENSIVE METABOLIC PANEL
ALT: 15 U/L (ref 0–44)
AST: 28 U/L (ref 15–41)
Albumin: 4.1 g/dL (ref 3.5–5.0)
Alkaline Phosphatase: 57 U/L (ref 38–126)
Anion gap: 9 (ref 5–15)
BUN: 10 mg/dL (ref 8–23)
CO2: 22 mmol/L (ref 22–32)
Calcium: 9.3 mg/dL (ref 8.9–10.3)
Chloride: 109 mmol/L (ref 98–111)
Creatinine, Ser: 0.62 mg/dL (ref 0.44–1.00)
GFR, Estimated: >60 mL/min (ref >60)
Glucose, Bld: 143 mg/dL — ABNORMAL HIGH (ref 70–99)
Potassium: 3.8 mmol/L (ref 3.5–5.1)
Sodium: 140 mmol/L (ref 135–145)
Total Bilirubin: 1.3 mg/dL — ABNORMAL HIGH (ref 0.3–1.2)
Total Protein: 6.7 g/dL (ref 6.5–8.1)

## 2021-07-09 LAB — COOXEMETRY PANEL
Carboxyhemoglobin: <0.3 % — ABNORMAL LOW (ref 0.5–1.5)
Methemoglobin: 24.7 % — ABNORMAL HIGH (ref 0.0–1.5)
O2 Saturation: 95.4 %
Total hemoglobin: 13.2 g/dL (ref 12.0–16.0)
Total oxygen content: 72.5 %

## 2021-07-09 LAB — TROPONIN I (HIGH SENSITIVITY): Troponin I (High Sensitivity): 3 ng/L (ref <18)

## 2021-07-09 MED ORDER — METHYLENE BLUE 1 % INJ SOLN
1.0000 mg/kg | Freq: Once | Status: AC
  Administered 2021-07-10: 89 mg via INTRAVENOUS
  Filled 2021-07-09: qty 8.9

## 2021-07-09 NOTE — ED Notes (Signed)
CRITICAL LAB VALUE Called by Inez Catalina, RN [facility] [id number] Dr. Leonides Schanz notified immediately.

## 2021-07-09 NOTE — ED Notes (Signed)
NEW TYPE AND SCREEN SENT ALONG WITH [facility]

## 2021-07-09 NOTE — ED Triage Notes (Addendum)
Pt presents via POV with complaints of "low o2 levels" and SOB for the last few days. She note having a hx of anemia and asthma. She notes using an inhaler PTA without much improvement. Denies CP.   Pt RA sat is 87% - pt placed on 4L [place] and sats improved to 92%.

## 2021-07-09 NOTE — ED Notes (Signed)
Pt. Placed on NRB, [name], RN went to notify Dr. Starleen Blue, and request MD to bedside.

## 2021-07-09 NOTE — ED Notes (Signed)
Dr. Leonides Schanz and Dr.McHugh to bedside.

## 2021-07-09 NOTE — ED Provider Notes (Signed)
[facility] Provider Note    Event Date/Time   First MD Initiated Contact with Patient 07/09/21 2302     (approximate)   History   Shortness of Breath   HPI  LAISHA RAU is a 66 y.o. 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  She denies any fevers, chills, chest pain or chest discomfort, lower extremity swelling.  No history of PE, DVT.  She has not been wheezing.  She does not wear oxygen at home.  She does report last week she had some cough and congestion.  Had negative COVID test at home.  She is followed by Dr. Lanney Gins with pulmonology.  Patient does state that she has been on Macrobid and Azo recently for UTI.  No known history of G6PD deficiency.  Is followed by hematology as an outpatient.  History provided by patient and husband.    Past Medical History:  Diagnosis Date   Allergy    Anemia    Anxiety    Arthritis    OA Knee; non-specific autoimmune process followed by [facility]/Rheumatology.   Asthma    Blood transfusion without reported diagnosis YRS AGO   COVID-19 03/2018   Depression    Gastric ulcer, unspecified as acute or chronic, without mention of hemorrhage, perforation, or obstruction YRS AGO   GERD (gastroesophageal reflux disease)    Hyperlipidemia    Hypertension    Migraine    hx of migraines   Obesity, unspecified    Other chronic cystitis    Personal history of colonic polyps    Pneumonia    PONV (postoperative nausea and vomiting)    NAUSEA, SCOPOLAMINE PATCH HELPED WITH LAST SURGERIES   Pre-diabetes     Past Surgical History:  Procedure Laterality Date   ABDOMINAL HYSTERECTOMY  01/25/1994   uterine fibroids; DUB; cervical dysplasia; ovaries resected two years later.     APPENDECTOMY      BILATERAL OOPHORECTOMY  2001   CESAREAN SECTION     x 2   COLON SURGERY  YRS AGO   SIGMOIDECTOMY   Colonoscopy  09/08/2012   diverticulosis, hemorrhoids.  [name].  Symptoms:  anemia, hemoccult +.   ESOPHAGOGASTRODUODENOSCOPY  09/08/2012   +HH.  [name].  Symptoms: anemia, hemoccult +.   HIP ARTHROSCOPY Right    LAPAROSCOPIC GASTRIC SLEEVE RESECTION N/A 03/01/2016   Procedure: LAPAROSCOPIC GASTRIC SLEEVE RESECTION, UPPER ENDO;  Surgeon: [name], MD;  Location: [facility];  Service: General;  Laterality: N/A;   LAPAROSCOPIC LYSIS OF ADHESIONS N/A 02/10/2016   Procedure: LAPAROSCOPIC LYSIS OF ADHESIONS;  Surgeon: [name], MD;  Location: [facility];  Service: General;  Laterality: N/A;   LAPAROSCOPY N/A 02/10/2016   Procedure: LAPAROSCOPY DIAGNOSTIC;  Surgeon: [name], MD;  Location: [facility];  Service: General;  Laterality: N/A;   NASAL SEPTUM SURGERY     NECK SURGERY     Rheumatology Consult  11/26/2011   multiple arthralgias. Autoimmune labs negative.  Rx for Plaquenil for non-specific autoimmune process.  [name].   SHOULDER ARTHROSCOPY WITH OPEN ROTATOR CUFF REPAIR AND DISTAL CLAVICLE ACROMINECTOMY Right 01/13/2021   Procedure: SHOULDER ARTHROSCOPY WITH OPEN ROTATOR CUFF REPAIR AND DISTAL CLAVICLE ACROMINECTOMY;  Surgeon: Thornton Park, MD;  Location: [facility];  Service: Orthopedics;  Laterality: Right;   SHOULDER ARTHROSCOPY WITH SUBACROMIAL DECOMPRESSION Right  01/13/2021   Procedure: SHOULDER ARTHROSCOPY WITH SUBACROMIAL DECOMPRESSION, BICEPS TENOTOMY;  Surgeon: Thornton Park, MD;  Location: [facility];  Service: Orthopedics;  Laterality: Right;   SIGMOID RESECTION / RECTOPEXY     SPINE SURGERY     TONSILLECTOMY AND ADENOIDECTOMY     TOTAL HIP ARTHROPLASTY Right 09/27/2017   Procedure: TOTAL HIP ARTHROPLASTY ANTERIOR APPROACH;  Surgeon: [name], MD;  Location: [facility];  Service: Orthopedics;  Laterality: Right;   TOTAL HIP ARTHROPLASTY Left 12/27/2017   Procedure: TOTAL  HIP ARTHROPLASTY ANTERIOR APPROACH;  Surgeon: [name], MD;  Location: [facility];  Service: Orthopedics;  Laterality: Left;    MEDICATIONS:  Prior to Admission medications   Medication Sig Start Date End Date Taking? Authorizing Provider  albuterol (VENTOLIN HFA) 108 (90 Base) MCG/ACT inhaler Inhale 1-2 puffs into the lungs every 6 (six) hours as needed for wheezing or shortness of breath. 01/30/20  Yes Bast, Traci A, NP  Azelastine-Fluticasone 137-50 MCG/ACT SUSP Place 2 sprays into the nose 2 (two) times a day. 08/10/18  Yes Darlin Priestly, PA-C  budesonide-formoterol (SYMBICORT) 160-4.5 MCG/ACT inhaler INHALE 2 PUFFS BY MOUTH 2 TIMES DAILY 04/27/21  Yes   busPIRone (BUSPAR) 10 MG tablet Take 1 tablet (10 mg total) by mouth 3 (three) times daily Patient taking differently: Take 10 mg by mouth daily as needed (anxiety). 12/25/20  Yes   DULoxetine (CYMBALTA) 60 MG capsule Take 1 capsule (60 mg total) by mouth 2 (two) times daily 06/19/21  Yes   gabapentin (NEURONTIN) 300 MG capsule Take 1-3 capsules (300-900 mg total) by mouth 3 (three) times daily 03/10/21  Yes [name], MD  HYDROcodone-acetaminophen [facility]) 10-325 MG tablet Take 1 tablet by mouth every 4 (four) hours as needed for Pain 05/29/21  Yes   ketoconazole (NIZORAL) 2 % cream Apply topically 2 (two) times daily. 06/27/21  Yes [provider]  ketoconazole (NIZORAL) 2 % shampoo apply three times per week, massage into scalp and leave in for 10 minutes before rinsing out Patient taking differently: Apply 1 application  topically every 14 (fourteen) days. massage into scalp and leave in for 10 minutes before rinsing out 11/13/20  Yes Moye, [place], MD  latanoprost (XALATAN) 0.005 % ophthalmic solution Place 1 drop into both eyes at bedtime. 06/27/21  Yes [provider]  levocetirizine (XYZAL) 5 MG tablet Take 1 tablet (5 mg total) by mouth every evening 04/01/21  Yes   methocarbamol (ROBAXIN) 750 MG tablet Take 750 mg by  mouth 3 (three) times daily as needed for muscle spasms.   Yes [provider]  montelukast (SINGULAIR) 10 MG tablet Take 1 tablet (10 mg total) by mouth at bedtime 05/28/21  Yes   Naphazoline-Pheniramine (OPCON-A) 0.027-0.315 % SOLN Apply 1 drop to eye 4 (four) times daily. 03/28/21  Yes Wurst, [place], PA-C  nitrofurantoin, macrocrystal-monohydrate, (MACROBID) 100 MG capsule Take 100 mg by mouth daily as needed. 06/27/21  Yes [provider]  omeprazole (PRILOSEC) 40 MG capsule Take 40 mg by mouth 2 (two) times daily. 06/26/21  Yes [provider]  promethazine (PHENERGAN) 25 MG tablet Take 1 tablet (25 mg total) by mouth every 8 (eight) hours as needed for Nausea 05/28/21  Yes   ipratropium (ATROVENT) 0.03 % nasal spray Place 2 sprays into both nostrils every 12 (twelve) hours. Patient not taking: Reported on 07/09/2021 01/01/21   Rubye Beach    Physical Exam   Triage Vital Signs: ED Triage Vitals  Enc Vitals  Group     BP 07/09/21 2231 (!) 167/77     Pulse Rate 07/09/21 2231 90     Resp 07/09/21 2231 (!) 24     Temp 07/09/21 2231 98.2 F (36.8 C)     Temp Source 07/09/21 2231 Oral     SpO2 07/09/21 2231 (!) 87 %     Weight 07/09/21 2230 195 lb (88.5 kg)     Height 07/09/21 2230 5' 7.5" (1.715 m)     Head Circumference --      Peak Flow --      Pain Score 07/09/21 2232 0     Pain Loc --      Pain Edu? --      Excl. in [place]? --     Most recent vital signs: Vitals:   07/09/21 2330 07/10/21 0000  BP: 137/64 (!) 171/84  Pulse: 89 91  Resp: 20 19  Temp:    SpO2: (!) 88% (!) 88%    CONSTITUTIONAL: Alert and oriented and responds appropriately to questions. Well-appearing; well-nourished, in no distress HEAD: Normocephalic, atraumatic EYES: Conjunctivae clear, pupils appear equal, sclera nonicteric ENT: normal nose; moist mucous membranes NECK: Supple, normal ROM CARD: RRR; S1 and S2 appreciated; no murmurs, no clicks, no rubs, no gallops RESP:  Normal chest excursion without splinting or tachypnea; breath sounds clear and equal bilaterally; no wheezes, no rhonchi, no rales, no respiratory distress, speaking full sentences; patient's oxygen saturation registering in the 60s on room air.  Currently 89% on BiPAP at 100% FiO2. ABD/GI: Normal bowel sounds; non-distended; soft, non-tender, no rebound, no guarding, no peritoneal signs BACK: The back appears normal EXT: Normal ROM in all joints; no deformity noted, no edema; patient has cyanotic fingertips and lips, no calf tenderness or calf swelling SKIN: Normal color for age and race; warm; no rash on exposed skin NEURO: Moves all extremities equally, normal speech PSYCH: The patient's mood and manner are appropriate.   ED Results / Procedures / Treatments   LABS: (all labs ordered are listed, but only abnormal results are displayed) Labs Reviewed  COMPREHENSIVE METABOLIC PANEL - Abnormal; Notable for the following components:      Result Value   Glucose, Bld 143 (*)    Total Bilirubin 1.3 (*)    All other components within normal limits  BLOOD GAS, ARTERIAL - Abnormal; Notable for the following components:   pH, Arterial 7.62 (*)    pCO2 arterial 19 (*)    pO2, Arterial 424 (*)    Bicarbonate 19.5 (*)    All other components within normal limits  COOXEMETRY PANEL - Abnormal; Notable for the following components:   Carboxyhemoglobin <0.3 (*)    Methemoglobin 24.7 (*)    All other components within normal limits  CBC WITH DIFFERENTIAL/PLATELET  BRAIN NATRIURETIC PEPTIDE  D-DIMER, QUANTITATIVE  URINALYSIS, ROUTINE W REFLEX MICROSCOPIC  TYPE AND SCREEN  TYPE AND SCREEN  TROPONIN I (HIGH SENSITIVITY)  TROPONIN I (HIGH SENSITIVITY)     EKG:  EKG Interpretation  Date/Time:  Thursday July 09 2021 22:29:25 EDT Ventricular Rate:  95 PR Interval:  166 QRS Duration: 86 QT Interval:  366 QTC Calculation: 459 R Axis:   44 Text Interpretation: Normal sinus rhythm Cannot rule  out Anterior infarct , age undetermined Abnormal ECG When compared with ECG of 17-Sep-2020 14:31, PREVIOUS ECG IS PRESENT Confirmed by Pryor Curia [phone]) on 07/09/2021 11:43:44 PM         RADIOLOGY: My personal review and interpretation of  imaging: Chest x-ray shows findings consistent with asthma but no infiltrate, edema or pneumothorax.  I have personally reviewed all radiology reports.   DG Chest Portable 1 View  Result Date: 07/09/2021 CLINICAL DATA:  Hypoxia.  Shortness of breath. EXAM: PORTABLE CHEST 1 VIEW COMPARISON:  Chest radiograph 09/21/2020, CT 09/18/2020 FINDINGS: Mild hyperinflation. Mild bronchial thickening. The heart is normal in size. Stable mediastinal contours subsegmental opacity in the lung bases. No pleural effusion or pneumothorax. Hardware in the lower cervical spine is partially included. IMPRESSION: 1. Mild hyperinflation and bronchial thickening suggesting asthma, bronchitis, or COPD. 2. Subsegmental bibasilar atelectasis. Electronically Signed   By: Keith Rake M.D.   On: 07/09/2021 23:27     PROCEDURES:  Critical Care performed: Yes, see critical care procedure note(s)   CRITICAL CARE Performed by: Cyril Mourning Dora Simeone   Total critical care time: 65 minutes  Critical care time was exclusive of separately billable procedures and treating other patients.  Critical care was necessary to treat or prevent imminent or life-threatening deterioration.  Critical care was time spent personally by me on the following activities: development of treatment plan with patient and/or surrogate as well as nursing, discussions with consultants, evaluation of patient's response to treatment, examination of patient, obtaining history from patient or surrogate, ordering and performing treatments and interventions, ordering and review of laboratory studies, ordering and review of radiographic studies, pulse oximetry and re-evaluation of patient's condition.   [name]1-3 Lead EKG  Interpretation  Performed by: Keandra Medero, Delice Bison, DO Authorized by: Dajsha Massaro, Delice Bison, DO     Interpretation: normal     ECG rate:  91   ECG rate assessment: normal     Rhythm: sinus rhythm     Ectopy: none     Conduction: normal       IMPRESSION / MDM / ASSESSMENT AND PLAN / ED COURSE  I reviewed the triage vital signs and the nursing notes.    Patient here with complaints of shortness of breath and found to be hypoxic and cyanotic.  The patient is on the cardiac monitor to evaluate for evidence of arrhythmia and/or significant heart rate changes.   DIFFERENTIAL DIAGNOSIS (includes but not limited to):   Asthma exacerbation, pneumothorax, PE, pneumonia, CHF, COVID-19, ARDS, methemoglobinemia   Patient's presentation is most consistent with acute presentation with potential threat to life or bodily function.   PLAN: We will obtain CBC, BMP, BNP, D-dimer, troponin, EKG, chest x-ray, ABG, co-ox panel.  She is currently on BiPAP and sats are still in the upper 80s but she does not have any increased work of breathing and her lungs are completely clear.  Her picture is not consistent with a pulmonary issue.  She is cyanotic here and has recently been on Macrobid.  Also has had history of previous hemolytic anemia but no known history of G6PD deficiency.  Concern for possible methemoglobinemia.   MEDICATIONS GIVEN IN ED: Medications  methylene blue IVPB 89 mg (has no administration in time range)     ED COURSE: Patient's labs confirm methemoglobinemia.  Her methemoglobin level is 24.  Her ABG shows a PaO2 of greater than 400.  This is not a pulmonary issue.  We will take her off of BiPAP.  Will discuss with hematology on-call for further recommendations.  We will give methylene blue.  Patient's hemoglobin today is normal at 13.  Normal electrolytes.  Troponin negative.  BNP normal.  D-dimer negative.  Chest x-ray reviewed/interpreted by myself and radiologist and shows no  infiltrate,  edema or pneumothorax.  Patient received methylene blue in the emergency department.  Seem to have some improvement in her cyanosis and sats now on room air in the low 90s.  CONSULTS:    Discussed with Dr. Janese Banks on-call for hematology.  She will have Dr. Tasia Catchings see the patient in the morning.  Agrees with giving methylene blue.   Consulted and discussed patient's case with hospitalist, Dr. Ara Kussmaul.  I have recommended admission and consulting physician agrees and will place admission orders.  Patient (and family if present) agree with this plan.   I reviewed all nursing notes, vitals, pertinent previous records.  All labs, EKGs, imaging ordered have been independently reviewed and interpreted by myself.    OUTSIDE RECORDS REVIEWED: Reviewed patient's last office visit with Dr. Tasia Catchings with hematology on 05/19/2021.       FINAL CLINICAL IMPRESSION(S) / ED DIAGNOSES   Final diagnoses:  Methemoglobinemia     Rx / DC Orders   ED Discharge Orders     None        Note:  This document was prepared using Dragon voice recognition software and may include unintentional dictation errors.   Marnette Perkins, Delice Bison, DO 07/10/21 [phone]

## 2021-07-09 NOTE — ED Notes (Signed)
This RN asked Dr. Starleen Blue to bedside for pt's O2 sat of 70%.

## 2021-07-10 ENCOUNTER — Other Ambulatory Visit: Payer: Self-pay | Admitting: Pharmacist

## 2021-07-10 ENCOUNTER — Other Ambulatory Visit: Payer: Self-pay

## 2021-07-10 ENCOUNTER — Encounter: Payer: Self-pay | Admitting: Pharmacist

## 2021-07-10 DIAGNOSIS — E7800 Pure hypercholesterolemia, unspecified: Secondary | ICD-10-CM | POA: Diagnosis present

## 2021-07-10 DIAGNOSIS — Z803 Family history of malignant neoplasm of breast: Secondary | ICD-10-CM | POA: Diagnosis not present

## 2021-07-10 DIAGNOSIS — K219 Gastro-esophageal reflux disease without esophagitis: Secondary | ICD-10-CM | POA: Diagnosis present

## 2021-07-10 DIAGNOSIS — T378X5A Adverse effect of other specified systemic anti-infectives and antiparasitics, initial encounter: Secondary | ICD-10-CM | POA: Diagnosis present

## 2021-07-10 DIAGNOSIS — D748 Other methemoglobinemias: Secondary | ICD-10-CM | POA: Diagnosis present

## 2021-07-10 DIAGNOSIS — Z825 Family history of asthma and other chronic lower respiratory diseases: Secondary | ICD-10-CM | POA: Diagnosis not present

## 2021-07-10 DIAGNOSIS — Z96643 Presence of artificial hip joint, bilateral: Secondary | ICD-10-CM | POA: Diagnosis present

## 2021-07-10 DIAGNOSIS — J45909 Unspecified asthma, uncomplicated: Secondary | ICD-10-CM | POA: Diagnosis present

## 2021-07-10 DIAGNOSIS — D749 Methemoglobinemia, unspecified: Secondary | ICD-10-CM | POA: Diagnosis present

## 2021-07-10 DIAGNOSIS — Z8249 Family history of ischemic heart disease and other diseases of the circulatory system: Secondary | ICD-10-CM | POA: Diagnosis not present

## 2021-07-10 DIAGNOSIS — I10 Essential (primary) hypertension: Secondary | ICD-10-CM | POA: Diagnosis present

## 2021-07-10 DIAGNOSIS — Z83438 Family history of other disorder of lipoprotein metabolism and other lipidemia: Secondary | ICD-10-CM | POA: Diagnosis not present

## 2021-07-10 DIAGNOSIS — E119 Type 2 diabetes mellitus without complications: Secondary | ICD-10-CM | POA: Diagnosis present

## 2021-07-10 DIAGNOSIS — Z833 Family history of diabetes mellitus: Secondary | ICD-10-CM | POA: Diagnosis not present

## 2021-07-10 DIAGNOSIS — Z87891 Personal history of nicotine dependence: Secondary | ICD-10-CM | POA: Diagnosis not present

## 2021-07-10 DIAGNOSIS — Z7951 Long term (current) use of inhaled steroids: Secondary | ICD-10-CM | POA: Diagnosis not present

## 2021-07-10 DIAGNOSIS — Z8616 Personal history of COVID-19: Secondary | ICD-10-CM | POA: Diagnosis not present

## 2021-07-10 DIAGNOSIS — R0902 Hypoxemia: Secondary | ICD-10-CM | POA: Diagnosis present

## 2021-07-10 DIAGNOSIS — Z79899 Other long term (current) drug therapy: Secondary | ICD-10-CM | POA: Diagnosis not present

## 2021-07-10 DIAGNOSIS — Z90710 Acquired absence of both cervix and uterus: Secondary | ICD-10-CM | POA: Diagnosis not present

## 2021-07-10 LAB — URINALYSIS, ROUTINE W REFLEX MICROSCOPIC
Bacteria, UA: NONE SEEN
Bilirubin Urine: NEGATIVE
Glucose, UA: NEGATIVE mg/dL
Hgb urine dipstick: NEGATIVE
Ketones, ur: NEGATIVE mg/dL
Leukocytes,Ua: NEGATIVE
Nitrite: POSITIVE — AB
Protein, ur: NEGATIVE mg/dL
Specific Gravity, Urine: 1.005 (ref 1.005–1.030)
pH: 7 (ref 5.0–8.0)

## 2021-07-10 LAB — COMPREHENSIVE METABOLIC PANEL
ALT: 15 U/L (ref 0–44)
ALT: 14 U/L (ref 0–44)
AST: 21 U/L (ref 15–41)
AST: 17 U/L (ref 15–41)
Albumin: 4.1 g/dL (ref 3.5–5.0)
Albumin: 4 g/dL (ref 3.5–5.0)
Alkaline Phosphatase: 59 U/L (ref 38–126)
Alkaline Phosphatase: 52 U/L (ref 38–126)
Anion gap: 6 (ref 5–15)
Anion gap: 6 (ref 5–15)
BUN: 10 mg/dL (ref 8–23)
BUN: 10 mg/dL (ref 8–23)
CO2: 24 mmol/L (ref 22–32)
CO2: 24 mmol/L (ref 22–32)
Calcium: 9.2 mg/dL (ref 8.9–10.3)
Calcium: 9 mg/dL (ref 8.9–10.3)
Chloride: 110 mmol/L (ref 98–111)
Chloride: 111 mmol/L (ref 98–111)
Creatinine, Ser: 0.61 mg/dL (ref 0.44–1.00)
Creatinine, Ser: 0.46 mg/dL (ref 0.44–1.00)
GFR, Estimated: >60 mL/min (ref >60)
GFR, Estimated: >60 mL/min (ref >60)
Glucose, Bld: 136 mg/dL — ABNORMAL HIGH (ref 70–99)
Glucose, Bld: 116 mg/dL — ABNORMAL HIGH (ref 70–99)
Potassium: 3.2 mmol/L — ABNORMAL LOW (ref 3.5–5.1)
Potassium: 3.4 mmol/L — ABNORMAL LOW (ref 3.5–5.1)
Sodium: 140 mmol/L (ref 135–145)
Sodium: 141 mmol/L (ref 135–145)
Total Bilirubin: 1.1 mg/dL (ref 0.3–1.2)
Total Bilirubin: 1 mg/dL (ref 0.3–1.2)
Total Protein: 6.9 g/dL (ref 6.5–8.1)
Total Protein: 6.5 g/dL (ref 6.5–8.1)

## 2021-07-10 LAB — CBC
HCT: 38.6 % (ref 36.0–46.0)
HCT: 36.6 % (ref 36.0–46.0)
Hemoglobin: 12.3 g/dL (ref 12.0–15.0)
Hemoglobin: 11.7 g/dL — ABNORMAL LOW (ref 12.0–15.0)
MCH: 30.8 pg (ref 26.0–34.0)
MCH: 30.9 pg (ref 26.0–34.0)
MCHC: 31.9 g/dL (ref 30.0–36.0)
MCHC: 32 g/dL (ref 30.0–36.0)
MCV: 96.7 fL (ref 80.0–100.0)
MCV: 96.6 fL (ref 80.0–100.0)
Platelets: 296 10*3/uL (ref 150–400)
Platelets: 280 10*3/uL (ref 150–400)
RBC: 3.99 MIL/uL (ref 3.87–5.11)
RBC: 3.79 MIL/uL — ABNORMAL LOW (ref 3.87–5.11)
RDW: 12.5 % (ref 11.5–15.5)
RDW: 12.5 % (ref 11.5–15.5)
WBC: 7.4 10*3/uL (ref 4.0–10.5)
WBC: 6.6 10*3/uL (ref 4.0–10.5)
nRBC: 0 % (ref 0.0–0.2)
nRBC: 0 % (ref 0.0–0.2)

## 2021-07-10 LAB — TYPE AND SCREEN
ABO/RH(D): O NEG
Antibody Screen: NEGATIVE

## 2021-07-10 LAB — D-DIMER, QUANTITATIVE: D-Dimer, Quant: 0.36 ug/mL-FEU (ref 0.00–0.50)

## 2021-07-10 LAB — BRAIN NATRIURETIC PEPTIDE: B Natriuretic Peptide: 8.6 pg/mL (ref 0.0–100.0)

## 2021-07-10 MED ORDER — ACETAMINOPHEN 650 MG RE SUPP: 650.0000 mg | Freq: Four times a day (QID) | RECTAL | Status: DC | PRN

## 2021-07-10 MED ORDER — SENNOSIDES-DOCUSATE SODIUM 8.6-50 MG PO TABS
1.0000 | ORAL_TABLET | Freq: Every evening | ORAL | Status: DC | PRN
Start: 2021-07-10 — End: 2021-07-11

## 2021-07-10 MED ORDER — BISACODYL 5 MG PO TBEC: 5.0000 mg | DELAYED_RELEASE_TABLET | Freq: Every day | ORAL | Status: DC | PRN

## 2021-07-10 MED ORDER — ACETAMINOPHEN 325 MG PO TABS: 650.0000 mg | ORAL_TABLET | Freq: Four times a day (QID) | ORAL | Status: DC | PRN

## 2021-07-10 MED ORDER — GABAPENTIN 300 MG PO CAPS
300.0000 mg | ORAL_CAPSULE | Freq: Three times a day (TID) | ORAL | Status: DC
  Administered 2021-07-10 – 2021-07-11 (×5): 300 mg via ORAL
  Filled 2021-07-10 (×5): qty 1

## 2021-07-10 MED ORDER — LORATADINE 10 MG PO TABS
10.0000 mg | ORAL_TABLET | Freq: Every evening | ORAL | Status: DC
  Administered 2021-07-10: 10 mg via ORAL
  Filled 2021-07-10: qty 1

## 2021-07-10 MED ORDER — IPRATROPIUM BROMIDE 0.02 % IN SOLN: 0.5000 mg | Freq: Four times a day (QID) | RESPIRATORY_TRACT | Status: DC | PRN

## 2021-07-10 MED ORDER — ONDANSETRON HCL 4 MG PO TABS: 4.0000 mg | ORAL_TABLET | Freq: Four times a day (QID) | ORAL | Status: DC | PRN

## 2021-07-10 MED ORDER — METHOCARBAMOL 500 MG PO TABS
750.0000 mg | ORAL_TABLET | Freq: Three times a day (TID) | ORAL | Status: DC | PRN
Start: 2021-07-10 — End: 2021-07-11
  Administered 2021-07-10: 750 mg via ORAL
  Filled 2021-07-10: qty 2

## 2021-07-10 MED ORDER — BUSPIRONE HCL 10 MG PO TABS
10.0000 mg | ORAL_TABLET | Freq: Every day | ORAL | Status: DC | PRN
  Administered 2021-07-10: 10 mg via ORAL
  Filled 2021-07-10: qty 1

## 2021-07-10 MED ORDER — ALBUTEROL SULFATE HFA 108 (90 BASE) MCG/ACT IN AERS: 1.0000 | INHALATION_SPRAY | Freq: Four times a day (QID) | RESPIRATORY_TRACT | Status: DC | PRN

## 2021-07-10 MED ORDER — ALBUTEROL SULFATE (2.5 MG/3ML) 0.083% IN NEBU: 2.5000 mg | INHALATION_SOLUTION | Freq: Four times a day (QID) | RESPIRATORY_TRACT | Status: DC | PRN

## 2021-07-10 MED ORDER — DULOXETINE HCL 30 MG PO CPEP
60.0000 mg | ORAL_CAPSULE | Freq: Two times a day (BID) | ORAL | Status: DC
  Administered 2021-07-10 – 2021-07-11 (×3): 60 mg via ORAL
  Filled 2021-07-10 (×3): qty 2

## 2021-07-10 MED ORDER — LATANOPROST 0.005 % OP SOLN
1.0000 [drp] | Freq: Every day | OPHTHALMIC | Status: DC
Start: 2021-07-10 — End: 2021-07-11
  Administered 2021-07-10: 1 [drp] via OPHTHALMIC
  Filled 2021-07-10: qty 2.5

## 2021-07-10 MED ORDER — AZELASTINE-FLUTICASONE 137-50 MCG/ACT NA SUSP: 2.0000 | Freq: Two times a day (BID) | NASAL | Status: DC

## 2021-07-10 MED ORDER — PANTOPRAZOLE SODIUM 40 MG PO TBEC
40.0000 mg | DELAYED_RELEASE_TABLET | Freq: Every day | ORAL | Status: DC
  Administered 2021-07-10 – 2021-07-11 (×2): 40 mg via ORAL
  Filled 2021-07-10 (×2): qty 1

## 2021-07-10 MED ORDER — NAPHAZOLINE-PHENIRAMINE 0.025-0.3 % OP SOLN: 1.0000 [drp] | Freq: Four times a day (QID) | OPHTHALMIC | Status: DC | PRN

## 2021-07-10 MED ORDER — MOMETASONE FURO-FORMOTEROL FUM 200-5 MCG/ACT IN AERO
2.0000 | INHALATION_SPRAY | Freq: Two times a day (BID) | RESPIRATORY_TRACT | Status: DC
  Administered 2021-07-10 – 2021-07-11 (×3): 2 via RESPIRATORY_TRACT
  Filled 2021-07-10: qty 8.8

## 2021-07-10 MED ORDER — ONDANSETRON HCL 4 MG/2ML IJ SOLN: 4.0000 mg | Freq: Four times a day (QID) | INTRAMUSCULAR | Status: DC | PRN

## 2021-07-10 MED ORDER — NAPHAZOLINE-PHENIRAMINE 0.025-0.3 % OP SOLN
1.0000 [drp] | Freq: Four times a day (QID) | OPHTHALMIC | Status: DC
Start: 2021-07-10 — End: 2021-07-10
  Filled 2021-07-10: qty 5

## 2021-07-10 MED ORDER — ENOXAPARIN SODIUM 60 MG/0.6ML IJ SOSY
0.5000 mg/kg | PREFILLED_SYRINGE | INTRAMUSCULAR | Status: DC
  Administered 2021-07-10 – 2021-07-11 (×2): 45 mg via SUBCUTANEOUS
  Filled 2021-07-10 (×2): qty 0.6

## 2021-07-10 MED ORDER — TRAZODONE HCL 50 MG PO TABS
25.0000 mg | ORAL_TABLET | Freq: Every evening | ORAL | Status: DC | PRN
  Administered 2021-07-10: 25 mg via ORAL
  Filled 2021-07-10: qty 1

## 2021-07-10 MED ORDER — MONTELUKAST SODIUM 10 MG PO TABS
10.0000 mg | ORAL_TABLET | Freq: Every day | ORAL | Status: DC
  Administered 2021-07-10: 10 mg via ORAL
  Filled 2021-07-10: qty 1

## 2021-07-10 MED ORDER — HYDROCODONE-ACETAMINOPHEN 10-325 MG PO TABS
1.0000 | ORAL_TABLET | Freq: Four times a day (QID) | ORAL | Status: DC | PRN
  Administered 2021-07-10: 1 via ORAL
  Administered 2021-07-10: 2 via ORAL
  Administered 2021-07-10 (×2): 1 via ORAL
  Administered 2021-07-11 (×2): 2 via ORAL
  Filled 2021-07-10: qty 2
  Filled 2021-07-10 (×3): qty 1
  Filled 2021-07-10 (×2): qty 2

## 2021-07-10 NOTE — Progress Notes (Signed)
PROGRESS NOTE  Brief Narrative: Bethany Scott is a 66 y.o. 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 Pulse oximetry had also read in 70%'s at home. She was placed on supplemental oxygen and due to worsening pulse oximetry readings, ultimately placed on BiPAP. However, her lungs were clear, no infiltrate was found on CXR, and further work up including d-dimer, BNP, troponin, and blood counts were unremarkable. When ABG was drawn, PaO2 was >400 and methemoglobin level was found to be elevated at 24%. Methylene blue was administered with improvement in cyanosis and hematology was consulted, recommending admission. Dr. Ara Kussmaul admitted her early this morning.   Subjective: Dyspnea improved. Her urine is blue, but the bluish discoloration of finger tips and lips has improved. She still has some cyanosis. Denies bleeding. She confirms that she recently took macrobid and pyridium for cystitis, though she has taken these medications off and on for decades.   Objective: BP 134/78 (BP Location: Left Arm)   Pulse 87   Temp 98.7 F (37.1 C)   Resp 18   Ht '5\' 7"'$  (1.702 m)   Wt 91.6 kg   SpO2 (!) 89%   BMI 31.64 kg/m   Gen: No distress Pulm: Clear and nonlabored  CV: RRR, no murmur, no JVD, no edema.  GI: Soft, NT, ND, +BS  Neuro: Alert and oriented. No focal deficits. Skin: +cyanosis without clubbing in fingers bilaterally, slight cyanosis on lips as well. No jaundice or scleral icterus.   Assessment & Plan: Bethany Scott is a very pleasant 66 year old female with a history of hemolytic anemia among others who was admitted this morning by Dr. Ara Kussmaul for methemoglobinemia, thus far has improved after methylene blue. Presumed precipitant is macrobid. She's taken no other new medications known to cause acquired methemoglobinemia, is not on well water. We will monitor  her clinically and continue plan of care as outlined in H&P, and await hematology formal evaluation. Note conventional pulse oximetry is not reliable in this condition, I've ordered repeat methemoglobin level which is pending.   Patrecia Pour, MD Pager on amion 07/10/2021, 11:51 AM

## 2021-07-10 NOTE — ED Notes (Signed)
Pharmacist called about the medication due,, pharmacist stated they will get to it when the pharm tech can.

## 2021-07-10 NOTE — Progress Notes (Signed)
Mobility Specialist - Progress Note    07/10/21 1300  Mobility  Activity Ambulated independently in hallway;Stood at bedside;Dangled on edge of bed  Level of Assistance Independent  Assistive Device None  Distance Ambulated (ft) 320 ft  Activity Response Tolerated well  $Mobility charge 1 Mobility    Pre-mobility: HR, BP, SpO2 During mobility: HR, BP, SpO2 Post-mobility: HR, BP, SPO2  Pt supine upon arrival using RA. Completes all activities indep and ambulates 2 laps around NS with O2 at 89%-90% throughout. Pt returns to bed and is left with needs in reach, pharmacist entering upon exit.  Merrily Brittle Mobility Specialist 07/10/21, 1:11 PM

## 2021-07-10 NOTE — H&P (Signed)
History and Physical   [facility] [place] [facility] Admission History and Physical [employer], D.O.    Patient Name: Bethany Scott MR#: [id number] Date of Birth: 1955-04-06 Date of Admission: 07/09/2021  Referring MD/NP/PA: Dr. Leonides Schanz Primary Care Physician: [name], MD  Chief Complaint:  Chief Complaint  Patient presents with   Shortness of Breath    HPI: Bethany Scott is a 66 y.o. 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  Patient was in a usual state of health until this afternoon while at work her coworker noticed that her lips were purple and suggested that she see a physician.  Patient went home and took a nap because she was feeling short of breath and fatigued but denied any other symptoms.  She did notice her lips were purple as were the tips of her fingers.  She has recently been prescribed Macrobid for urinary tract infection.  Of note she was recently diagnosed with a viral illness along with her husband and son for which her husband and son received antibiotics and steroids but she did not.  Of note she does follow with Dr. Tasia Catchings for hematology as an outpatient and has recently had blood work to work-up possible G6PD deficiency but is unaware of any results.  She has had hemolytic anemia following Levaquin in the past and required blood transfusion..  Patient denies fevers/chills, weakness, dizziness, chest pain, shortness of breath, N/V/C/D, abdominal pain, dysuria/frequency, changes in mental status.    Otherwise there has been no change in status. Patient has been taking medication as prescribed and there has been no recent change in medication or diet.  No recent antibiotics.  There has been no recent illness, hospitalizations, travel or sick contacts.    EMS/ED Course: Patient received  methylene blue.  Her initial O2 sats were in the 60% range and so she was placed on BiPAP but was never in any respiratory distress.  After ABG revealed a blood oxygen saturation of 97% the BiPAP was discontinued medical admission has been requested for further management of methemoglobinemia.  Review of Systems:  CONSTITUTIONAL: Purple lips and fingertips.  No fever/chills, fatigue, weakness, weight gain/loss, headache. EYES: No blurry or double vision. ENT: No tinnitus, postnasal drip, redness or soreness of the oropharynx. RESPIRATORY: No cough, dyspnea, wheeze.  No hemoptysis.  CARDIOVASCULAR: No chest pain, palpitations, syncope, orthopnea. No lower extremity edema.  GASTROINTESTINAL: No nausea, vomiting, abdominal pain, diarrhea, constipation.  No hematemesis, melena or hematochezia. GENITOURINARY: No dysuria, frequency, hematuria. ENDOCRINE: No polyuria or nocturia. No heat or cold intolerance. HEMATOLOGY: No anemia, bruising, bleeding. INTEGUMENTARY: No rashes, ulcers, lesions. MUSCULOSKELETAL: No arthritis, gout, dyspnea. NEUROLOGIC: No numbness, tingling, ataxia, seizure-type activity, weakness. PSYCHIATRIC: No anxiety, depression, insomnia.   Past Medical History:  Diagnosis Date   Allergy    Anemia    Anxiety    Arthritis    OA Knee; non-specific autoimmune process followed by [facility]/Rheumatology.   Asthma    Blood transfusion without reported diagnosis YRS AGO   COVID-19 03/2018   Depression    Gastric ulcer, unspecified as acute or chronic, without mention of hemorrhage, perforation, or obstruction YRS AGO   GERD (gastroesophageal reflux disease)    Hyperlipidemia    Hypertension    Migraine    hx of migraines   Obesity, unspecified    Other chronic cystitis  Personal history of colonic polyps    Pneumonia    PONV (postoperative nausea and vomiting)    NAUSEA, SCOPOLAMINE PATCH HELPED WITH LAST SURGERIES   Pre-diabetes     Past Surgical History:   Procedure Laterality Date   ABDOMINAL HYSTERECTOMY  01/25/1994   uterine fibroids; DUB; cervical dysplasia; ovaries resected two years later.     APPENDECTOMY     BILATERAL OOPHORECTOMY  2001   CESAREAN SECTION     x 2   COLON SURGERY  YRS AGO   SIGMOIDECTOMY   Colonoscopy  09/08/2012   diverticulosis, hemorrhoids.  [name].  Symptoms:  anemia, hemoccult +.   ESOPHAGOGASTRODUODENOSCOPY  09/08/2012   +HH.  [name].  Symptoms: anemia, hemoccult +.   HIP ARTHROSCOPY Right    LAPAROSCOPIC GASTRIC SLEEVE RESECTION N/A 03/01/2016   Procedure: LAPAROSCOPIC GASTRIC SLEEVE RESECTION, UPPER ENDO;  Surgeon: [name], MD;  Location: [facility];  Service: General;  Laterality: N/A;   LAPAROSCOPIC LYSIS OF ADHESIONS N/A 02/10/2016   Procedure: LAPAROSCOPIC LYSIS OF ADHESIONS;  Surgeon: [name], MD;  Location: [facility];  Service: General;  Laterality: N/A;   LAPAROSCOPY N/A 02/10/2016   Procedure: LAPAROSCOPY DIAGNOSTIC;  Surgeon: [name], MD;  Location: [facility];  Service: General;  Laterality: N/A;   NASAL SEPTUM SURGERY     NECK SURGERY     Rheumatology Consult  11/26/2011   multiple arthralgias. Autoimmune labs negative.  Rx for Plaquenil for non-specific autoimmune process.  [name].   SHOULDER ARTHROSCOPY WITH OPEN ROTATOR CUFF REPAIR AND DISTAL CLAVICLE ACROMINECTOMY Right 01/13/2021   Procedure: SHOULDER ARTHROSCOPY WITH OPEN ROTATOR CUFF REPAIR AND DISTAL CLAVICLE ACROMINECTOMY;  Surgeon: Thornton Park, MD;  Location: [facility];  Service: Orthopedics;  Laterality: Right;   SHOULDER ARTHROSCOPY WITH SUBACROMIAL DECOMPRESSION Right 01/13/2021   Procedure: SHOULDER ARTHROSCOPY WITH SUBACROMIAL DECOMPRESSION, BICEPS TENOTOMY;  Surgeon: Thornton Park, MD;  Location: [facility];  Service: Orthopedics;  Laterality: Right;   SIGMOID RESECTION / RECTOPEXY     SPINE SURGERY     TONSILLECTOMY AND ADENOIDECTOMY     TOTAL HIP ARTHROPLASTY Right 09/27/2017   Procedure: TOTAL HIP ARTHROPLASTY ANTERIOR  APPROACH;  Surgeon: [name], MD;  Location: [facility];  Service: Orthopedics;  Laterality: Right;   TOTAL HIP ARTHROPLASTY Left 12/27/2017   Procedure: TOTAL HIP ARTHROPLASTY ANTERIOR APPROACH;  Surgeon: [name], MD;  Location: [facility];  Service: Orthopedics;  Laterality: Left;     reports that she has quit smoking. Her smoking use included cigarettes. She has a 5.00 pack-year smoking history. She has never used smokeless tobacco. She reports current alcohol use. She reports that she does not use drugs.  Allergies  Allergen Reactions   Cephalexin Anaphylaxis   Cephalosporins Anaphylaxis   Penicillins Anaphylaxis and Other (See Comments)    Has patient had a PCN reaction causing immediate rash, facial/tongue/throat swelling, SOB or lightheadedness with hypotension: Yes Has patient had a PCN reaction causing severe rash involving mucus membranes or skin necrosis: No Has patient had a PCN reaction that required hospitalization Yes Has patient had a PCN reaction occurring within the last 10 years: No If all of the above answers are "NO", then may proceed with Cephalosporin use.    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    Family History  Problem Relation Age of Onset   Bipolar disorder Father    Transient ischemic attack Father    Dementia Father  secondary to head trauma   Hypertension Father    Diabetes Father    CAD Father    Cancer Mother 71       lung   Migraines Mother    Hypothyroidism Mother    Hypertension Sister    Atrial fibrillation Sister    Hypothyroidism Sister    Alcohol abuse Brother    Hypertension Brother    Alcohol abuse Brother    Atrial fibrillation Maternal Grandmother    COPD Maternal Grandmother    Diabetes Maternal Grandmother    Hyperlipidemia Maternal Grandmother    Diabetes Maternal Grandfather    Diabetes Paternal Grandfather    Heart disease Paternal Grandfather    Breast cancer  Sister 52    Prior to Admission medications   Medication Sig Start Date End Date Taking? Authorizing Provider  albuterol (VENTOLIN HFA) 108 (90 Base) MCG/ACT inhaler Inhale 1-2 puffs into the lungs every 6 (six) hours as needed for wheezing or shortness of breath. 01/30/20  Yes Bast, Traci A, NP  Azelastine-Fluticasone 137-50 MCG/ACT SUSP Place 2 sprays into the nose 2 (two) times a day. 08/10/18  Yes Darlin Priestly, PA-C  budesonide-formoterol (SYMBICORT) 160-4.5 MCG/ACT inhaler INHALE 2 PUFFS BY MOUTH 2 TIMES DAILY 04/27/21  Yes   busPIRone (BUSPAR) 10 MG tablet Take 1 tablet (10 mg total) by mouth 3 (three) times daily Patient taking differently: Take 10 mg by mouth daily as needed (anxiety). 12/25/20  Yes   DULoxetine (CYMBALTA) 60 MG capsule Take 1 capsule (60 mg total) by mouth 2 (two) times daily 06/19/21  Yes   gabapentin (NEURONTIN) 300 MG capsule Take 1-3 capsules (300-900 mg total) by mouth 3 (three) times daily 03/10/21  Yes [name], MD  HYDROcodone-acetaminophen [facility]) 10-325 MG tablet Take 1 tablet by mouth every 4 (four) hours as needed for Pain 05/29/21  Yes   ketoconazole (NIZORAL) 2 % cream Apply topically 2 (two) times daily. 06/27/21  Yes [provider]  ketoconazole (NIZORAL) 2 % shampoo apply three times per week, massage into scalp and leave in for 10 minutes before rinsing out Patient taking differently: Apply 1 application  topically every 14 (fourteen) days. massage into scalp and leave in for 10 minutes before rinsing out 11/13/20  Yes Moye, [place], MD  latanoprost (XALATAN) 0.005 % ophthalmic solution Place 1 drop into both eyes at bedtime. 06/27/21  Yes [provider]  levocetirizine (XYZAL) 5 MG tablet Take 1 tablet (5 mg total) by mouth every evening 04/01/21  Yes   methocarbamol (ROBAXIN) 750 MG tablet Take 750 mg by mouth 3 (three) times daily as needed for muscle spasms.   Yes [provider]  montelukast (SINGULAIR) 10 MG tablet Take 1  tablet (10 mg total) by mouth at bedtime 05/28/21  Yes   Naphazoline-Pheniramine (OPCON-A) 0.027-0.315 % SOLN Apply 1 drop to eye 4 (four) times daily. 03/28/21  Yes Wurst, [place], PA-C  nitrofurantoin, macrocrystal-monohydrate, (MACROBID) 100 MG capsule Take 100 mg by mouth daily as needed. 06/27/21  Yes [provider]  omeprazole (PRILOSEC) 40 MG capsule Take 40 mg by mouth 2 (two) times daily. 06/26/21  Yes [provider]  promethazine (PHENERGAN) 25 MG tablet Take 1 tablet (25 mg total) by mouth every 8 (eight) hours as needed for Nausea 05/28/21  Yes   ipratropium (ATROVENT) 0.03 % nasal spray Place 2 sprays into both nostrils every 12 (twelve) hours. Patient not taking: Reported on 07/09/2021 01/01/21   Mar Daring, PA-C  Physical Exam: Vitals:   07/09/21 2315 07/09/21 2320 07/09/21 2330 07/10/21 0000  BP:   137/64 (!) 171/84  Pulse: 91 92 89 91  Resp: (!) '22 19 20 19  '$ Temp:      TempSrc:      SpO2: (!) 88% (!) 88% (!) 88% (!) 88%  Weight:      Height:        GENERAL: 66 y.o.-year-old white female patient, well-developed, well-nourished lying in the bed in no acute distress.  Pleasant and cooperative.   HEENT: Head atraumatic, normocephalic. Pupils equal. Mucus membranes moist.  Lips are cyanotic NECK: Supple. No JVD. CHEST: Normal breath sounds bilaterally. No wheezing, rales, rhonchi or crackles. No use of accessory muscles of respiration.  No reproducible chest wall tenderness.  CARDIOVASCULAR: S1, S2 normal. No murmurs, rubs, or gallops. Cap refill <2 seconds. Pulses intact distally.  ABDOMEN: Soft, nondistended, nontender. No rebound, guarding, rigidity. Normoactive bowel sounds present in all four quadrants.  EXTREMITIES: No pedal edema, cyanosis, or clubbing. No calf tenderness or Homan's sign.  NEUROLOGIC: The patient is alert and oriented x 3. Cranial nerves II through XII are grossly intact with no focal sensorimotor deficit. PSYCHIATRIC:  Normal  affect, mood, thought content. SKIN: Warm, dry, and intact without obvious rash, lesion, or ulcer.    Labs on Admission:  CBC: Recent Labs  Lab 07/09/21 2300  WBC 7.6  NEUTROABS 2.8  HGB 13.0  HCT 41.1  MCV 98.3  PLT 465   Basic Metabolic Panel: Recent Labs  Lab 07/09/21 2300  NA 140  K 3.8  CL 109  CO2 22  GLUCOSE 143*  BUN 10  CREATININE 0.62  CALCIUM 9.3   GFR: Estimated Creatinine Clearance: 80.9 mL/min (by C-G formula based on SCr of 0.62 mg/dL). Liver Function Tests: Recent Labs  Lab 07/09/21 2300  AST 28  ALT 15  ALKPHOS 57  BILITOT 1.3*  PROT 6.7  ALBUMIN 4.1   No results for input(s): "LIPASE", "AMYLASE" in the last 168 hours. No results for input(s): "AMMONIA" in the last 168 hours. Coagulation Profile: No results for input(s): "INR", "PROTIME" in the last 168 hours. Cardiac Enzymes: No results for input(s): "CKTOTAL", "CKMB", "CKMBINDEX", "TROPONINI" in the last 168 hours. BNP (last 3 results) No results for input(s): "PROBNP" in the last 8760 hours. HbA1C: No results for input(s): "HGBA1C" in the last 72 hours. CBG: No results for input(s): "GLUCAP" in the last 168 hours. Lipid Profile: No results for input(s): "CHOL", "HDL", "LDLCALC", "TRIG", "CHOLHDL", "LDLDIRECT" in the last 72 hours. Thyroid Function Tests: No results for input(s): "TSH", "T4TOTAL", "FREET4", "T3FREE", "THYROIDAB" in the last 72 hours. Anemia Panel: No results for input(s): "VITAMINB12", "FOLATE", "FERRITIN", "TIBC", "IRON", "RETICCTPCT" in the last 72 hours. Urine analysis:    Component Value Date/Time   COLORURINE AMBER (A) 09/17/2020 1628   APPEARANCEUR CLEAR (A) 09/17/2020 1628   LABSPEC 1.010 09/17/2020 1628   PHURINE 5.0 09/17/2020 1628   GLUCOSEU NEGATIVE 09/17/2020 1628   HGBUR NEGATIVE 09/17/2020 1628   BILIRUBINUR NEGATIVE 09/17/2020 1628   BILIRUBINUR positive 04/05/2018 [place] 09/17/2020 1628   PROTEINUR NEGATIVE 09/17/2020 1628    UROBILINOGEN >=8.0 (A) 04/05/2018 1633   NITRITE POSITIVE (A) 09/17/2020 1628   LEUKOCYTESUR NEGATIVE 09/17/2020 1628   Sepsis Labs: '@LABRCNTIP'$ (procalcitonin:4,lacticidven:4) )No results found for this or any previous visit (from the past 240 hour(s)).   Radiological Exams on Admission: DG Chest Portable 1 View  Result Date: 07/09/2021 CLINICAL DATA:  Hypoxia.  Shortness of breath. EXAM: PORTABLE CHEST 1 VIEW COMPARISON:  Chest radiograph 09/21/2020, CT 09/18/2020 FINDINGS: Mild hyperinflation. Mild bronchial thickening. The heart is normal in size. Stable mediastinal contours subsegmental opacity in the lung bases. No pleural effusion or pneumothorax. Hardware in the lower cervical spine is partially included. IMPRESSION: 1. Mild hyperinflation and bronchial thickening suggesting asthma, bronchitis, or COPD. 2. Subsegmental bibasilar atelectasis. Electronically Signed   By: Keith Rake M.D.   On: 07/09/2021 23:27     Assessment/Plan  This is a 66 y.o. 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  Methemoglobinemia, poss G6PD deficiency - Admit inpatient - Receiving methylene blue in the emergency department - We will trend CBC and CMP to monitor for hemolysis - We will discontinue antibiotics - Dr. Tasia Catchings will see the patient in the morning  #.  History of asthma - Continue Flonase, Symbicort, Xyzal, Singulair  #.  History of GERD Continue Protonix or Prilosec  Admission status: Inpatient IV Fluids: Hep-Lock Diet/Nutrition: Regular Consults called: Dr. Tasia Catchings from hematology DVT Px:  SCDs and early ambulation. Code Status: Full Code  Disposition Plan: To home in 1-2 days  All the records are reviewed and case discussed with ED provider. Management plans discussed with the patient and/or family who express understanding and agree with plan of care.  Emanuel Dowson D.O. on 07/10/2021 at 12:12 AM CC: Primary care physician; [name], MD   07/10/2021, 12:12 AM

## 2021-07-10 NOTE — Consult Note (Addendum)
Hematology/Oncology Consult note [facility] Telephone:([phone] [phone]  Patient Care Team: [name], MD as PCP - General (Family Medicine) Ammie Dalton, Okey Regal, MD (Obstetrics and Gynecology)   Name of the patient: Bethany Scott  [id number]  05-15-1955    Reason for consult: Concern for methemoglobinemia   Requesting physician: Dr. Leonides Schanz  Date of visit: 07/10/2021    History of presenting illness- Patient is a 66 year old female who sees Dr. 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  She also has a history of iron deficiency and folate acid deficiency.  Patient was admitted for bilateral pneumonia in August 2022 at that time she was treated with antibiotics.  Most recently she was evaluated for a hemoglobin of 6.6 and underwent a comprehensive work-up which showed normal ferritin of 183 with a normal iron saturation folate level normal at 18.8 B12 levels normal at 494 immunoglobulins were normal haptoglobin was low initially at less than 10 and subsequently in May 2023 it was still low at 16 LDH has been normal.  She has had G6PD levels checked 3 different occasions over the last 1 month and they have been within normal limits.  Coombs test in April 2023 was negative.  PNH profile testing negative.  Her hemoglobin which was 6.6 in August 2022 subsequently recovered to 10.5-11 over the next 6 months.  Hemoglobinopathy evaluation negative. 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  Patient presented to the ER with symptoms of shortness of breath and her lips turning purple.  She was given Azo and Macrobid recently for UTI.Patient had a core oximetry panel performed in the hospital which showed an elevated methemoglobin level of 24.7%.  Carboxyhemoglobin level less than 0.3 and oxygen saturation on that sample was 95.4% patient was given methylene blue with improvement of her cyanosis to some  extent and oxygen saturation on room air was about 90%  Patient her husband feels that her lips are less purple today.  She is somewhat anxious but does not feel overtly short of breath.  She is not presently on oxygen   Pain scale- 0   Review of systems- ROS  Allergies  Allergen Reactions   Cephalexin Anaphylaxis   Cephalosporins Anaphylaxis   Penicillins Anaphylaxis and Other (See Comments)    Has patient had a PCN reaction causing immediate rash, facial/tongue/throat swelling, SOB or lightheadedness with hypotension: Yes Has patient had a PCN reaction causing severe rash involving mucus membranes or skin necrosis: No Has patient had a PCN reaction that required hospitalization Yes Has patient had a PCN reaction occurring within the last 10 years: No If all of the above answers are "NO", then may proceed with Cephalosporin use.    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[place]) 03/27/2021   Community acquired pneumonia of left lung    Acute respiratory failure with hypoxia ([place]) 09/18/2020   Sepsis ([place]) 09/17/2020   Status post total hip replacement, 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   Status post total hip replacement, right 09/27/2017   Incomplete emptying of bladder 02/16/2017   Chronic cystitis 02/08/2017   Hematuria, microscopic 02/08/2017   History of nephrolithiasis 02/08/2017   Pelvic pain in female 02/08/2017   Chronic venous insufficiency 01/06/2017   Varicose veins of leg with  pain, right 12/31/2016   GERD (gastroesophageal reflux disease) 03/01/2016   History of gastric bypass 03/01/2016   Hip arthritis 05/29/2015   Degenerative joint disease (DJD) of hip 07/24/2014   DJD (degenerative joint disease) of knee 06/17/2014   Greater trochanteric bursitis of both hips 06/17/2014    Degenerative joint disease of sacroiliac joint ([place]) 06/17/2014   Facet syndrome, lumbar 06/17/2014   DDD (degenerative disc disease), lumbar 06/17/2014   Low serum vitamin D 06/08/2013   Sinusitis, acute, maxillary 02/08/2012   Migraines 02/08/2012   Depression with anxiety 02/08/2012   Essential hypertension, benign 02/08/2012   Pure hypercholesterolemia 02/08/2012   Diabetes mellitus type 2 in obese ([place]) 02/08/2012     Past Medical History:  Diagnosis Date   Allergy    Anemia    Anxiety    Arthritis    OA Knee; non-specific autoimmune process followed by [facility]/Rheumatology.   Asthma    Blood transfusion without reported diagnosis YRS AGO   COVID-19 03/2018   Depression    Gastric ulcer, unspecified as acute or chronic, without mention of hemorrhage, perforation, or obstruction YRS AGO   GERD (gastroesophageal reflux disease)    Hyperlipidemia    Hypertension    Migraine    hx of migraines   Obesity, unspecified    Other chronic cystitis    Personal history of colonic polyps    Pneumonia    PONV (postoperative nausea and vomiting)    NAUSEA, SCOPOLAMINE PATCH HELPED WITH LAST SURGERIES   Pre-diabetes      Past Surgical History:  Procedure Laterality Date   ABDOMINAL HYSTERECTOMY  01/25/1994   uterine fibroids; DUB; cervical dysplasia; ovaries resected two years later.     APPENDECTOMY     BILATERAL OOPHORECTOMY  2001   CESAREAN SECTION     x 2   COLON SURGERY  YRS AGO   SIGMOIDECTOMY   Colonoscopy  09/08/2012   diverticulosis, hemorrhoids.  [name].  Symptoms:  anemia, hemoccult +.   ESOPHAGOGASTRODUODENOSCOPY  09/08/2012   +HH.  [name].  Symptoms: anemia, hemoccult +.   HIP ARTHROSCOPY Right    LAPAROSCOPIC GASTRIC SLEEVE RESECTION N/A 03/01/2016   Procedure: LAPAROSCOPIC GASTRIC SLEEVE RESECTION, UPPER ENDO;  Surgeon: [name], MD;  Location: [facility];  Service: General;  Laterality: N/A;   LAPAROSCOPIC LYSIS OF ADHESIONS N/A 02/10/2016   Procedure:  LAPAROSCOPIC LYSIS OF ADHESIONS;  Surgeon: [name], MD;  Location: [facility];  Service: General;  Laterality: N/A;   LAPAROSCOPY N/A 02/10/2016   Procedure: LAPAROSCOPY DIAGNOSTIC;  Surgeon: [name], MD;  Location: [facility];  Service: General;  Laterality: N/A;   NASAL SEPTUM SURGERY     NECK SURGERY     Rheumatology Consult  11/26/2011   multiple arthralgias. Autoimmune labs negative.  Rx for Plaquenil for non-specific autoimmune process.  [name].   SHOULDER ARTHROSCOPY WITH OPEN ROTATOR CUFF REPAIR AND DISTAL CLAVICLE ACROMINECTOMY Right 01/13/2021   Procedure: SHOULDER ARTHROSCOPY WITH OPEN ROTATOR CUFF REPAIR AND DISTAL CLAVICLE ACROMINECTOMY;  Surgeon: Thornton Park, MD;  Location: [facility];  Service: Orthopedics;  Laterality: Right;   SHOULDER ARTHROSCOPY WITH SUBACROMIAL DECOMPRESSION Right 01/13/2021   Procedure: SHOULDER ARTHROSCOPY WITH SUBACROMIAL DECOMPRESSION, BICEPS TENOTOMY;  Surgeon: Thornton Park, MD;  Location: [facility];  Service: Orthopedics;  Laterality: Right;   SIGMOID RESECTION / RECTOPEXY     SPINE SURGERY     TONSILLECTOMY AND ADENOIDECTOMY     TOTAL HIP ARTHROPLASTY Right 09/27/2017   Procedure: TOTAL HIP ARTHROPLASTY ANTERIOR  APPROACH;  Surgeon: [name], MD;  Location: [facility];  Service: Orthopedics;  Laterality: Right;   TOTAL HIP ARTHROPLASTY Left 12/27/2017   Procedure: TOTAL HIP ARTHROPLASTY ANTERIOR APPROACH;  Surgeon: [name], MD;  Location: [facility];  Service: Orthopedics;  Laterality: Left;    Social History   Socioeconomic History   Marital status: Married    Spouse name: Not on file   Number of children: 2   Years of education: 12   Highest education level: Not on file  Occupational History   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      Children: 2 sons (24, 56); 2 grandchildren  (93yo Atlast, 29 yo August)      Lives: with husband, [name]/son.      Employment: check in at [employer] in [place] x 2004; loves job.      Tobacco abuse:  Former smoker.      Alcohol:  None; holidays only      Exercise: none in 2017     Seatbelt: 100%; no texting   Social Determinants of Health   Financial Resource Strain: Not on file  Food Insecurity: Not on file  Transportation Needs: Not on file  Physical Activity: Not on file  Stress: Not on file  Social Connections: Not on file  Intimate Partner Violence: Not on file     Family History  Problem Relation Age of Onset   Bipolar disorder Father    Transient ischemic attack Father    Dementia Father        secondary to head trauma   Hypertension Father    Diabetes Father    CAD Father    Cancer Mother 28       lung   Migraines Mother    Hypothyroidism Mother    Hypertension Sister    Atrial fibrillation Sister    Hypothyroidism Sister    Alcohol abuse Brother    Hypertension Brother    Alcohol abuse Brother    Atrial fibrillation Maternal Grandmother    COPD Maternal Grandmother    Diabetes Maternal Grandmother    Hyperlipidemia Maternal Grandmother    Diabetes Maternal Grandfather    Diabetes Paternal Grandfather    Heart disease Paternal Grandfather    Breast cancer Sister 19     Current Facility-Administered Medications:    acetaminophen (TYLENOL) tablet 650 mg, 650 mg, Oral, Q6H PRN **OR** acetaminophen (TYLENOL) suppository 650 mg, 650 mg,  Rectal, Q6H PRN, Hugelmeyer, Alexis, DO   albuterol (PROVENTIL) (2.5 MG/3ML) 0.083% nebulizer solution 2.5 mg, 2.5 mg, Nebulization, Q6H PRN, Belue, [name] S, RPH   bisacodyl (DULCOLAX) EC tablet 5 mg, 5 mg, Oral, Daily PRN, Hugelmeyer, Alexis, DO   busPIRone (BUSPAR) tablet 10 mg, 10 mg, Oral, Daily PRN, Hugelmeyer,  Alexis, DO   DULoxetine (CYMBALTA) DR capsule 60 mg, 60 mg, Oral, BID, Hugelmeyer, Alexis, DO, 60 mg at 07/10/21 0815   enoxaparin (LOVENOX) injection 45 mg, 0.5 mg/kg, Subcutaneous, Q24H, Hugelmeyer, Alexis, DO, 45 mg at 07/10/21 0816   gabapentin (NEURONTIN) capsule 300 mg, 300 mg, Oral, TID, Hugelmeyer, Alexis, DO, 300 mg at 07/10/21 0816   HYDROcodone-acetaminophen (NORCO) 10-325 MG per tablet 1-2 tablet, 1-2 tablet, Oral, Q6H PRN, Hugelmeyer, Alexis, DO, 1 tablet at 07/10/21 0816   ipratropium (ATROVENT) nebulizer solution 0.5 mg, 0.5 mg, Nebulization, Q6H PRN, Hugelmeyer, Alexis, DO   latanoprost (XALATAN) 0.005 % ophthalmic solution 1 drop, 1 drop, Both Eyes, QHS, Hugelmeyer, Alexis, DO   loratadine (CLARITIN) tablet 10 mg, 10 mg, Oral, QPM, Hugelmeyer, Alexis, DO   methocarbamol (ROBAXIN) tablet 750 mg, 750 mg, Oral, TID PRN, Hugelmeyer, Alexis, DO   mometasone-formoterol (DULERA) 200-5 MCG/ACT inhaler 2 puff, 2 puff, Inhalation, BID, Hugelmeyer, Alexis, DO, 2 puff at 07/10/21 0819   montelukast (SINGULAIR) tablet 10 mg, 10 mg, Oral, QHS, Hugelmeyer, Alexis, DO   naphazoline-pheniramine (NAPHCON-A) 0.025-0.3 % ophthalmic solution 1 drop, 1 drop, Both Eyes, QID PRN, Vallery Sa D, RPH   ondansetron (ZOFRAN) tablet 4 mg, 4 mg, Oral, Q6H PRN **OR** ondansetron (ZOFRAN) injection 4 mg, 4 mg, Intravenous, Q6H PRN, Hugelmeyer, Alexis, DO   pantoprazole (PROTONIX) EC tablet 40 mg, 40 mg, Oral, Daily, Hugelmeyer, Alexis, DO, 40 mg at 07/10/21 0816   senna-docusate (Senokot-S) tablet 1 tablet, 1 tablet, Oral, QHS PRN, Hugelmeyer, Alexis, DO   traZODone (DESYREL) tablet 25 mg, 25 mg,  Oral, QHS PRN, Hugelmeyer, Alexis, DO   Physical exam:  Vitals:   07/10/21 0158 07/10/21 0301 07/10/21 0600 07/10/21 0741  BP: (!) 166/72   134/78  Pulse: 89   87  Resp: 20   18  Temp: 98.4 F (36.9 C)   98.7 F (37.1 C)  TempSrc: Oral     SpO2: 91% (!) 88% 91% (!) 89%  Weight: 202 lb (91.6 kg)     Height: '5\' 7"'$  (1.702 m)      Physical Exam Cardiovascular:     Rate and Rhythm: Normal rate and regular rhythm.     Heart sounds: Normal heart sounds.  Pulmonary:     Effort: Pulmonary effort is normal.     Breath sounds: Normal breath sounds.  Abdominal:     General: Bowel sounds are normal.     Palpations: Abdomen is soft.  Skin:    General: Skin is warm and dry.  Neurological:     Mental Status: She is alert and oriented to person, place, and time.           Latest Ref Rng & Units 07/10/2021    4:00 AM  CMP  Glucose 70 - 99 mg/dL 116   BUN 8 - 23 mg/dL 10   Creatinine 0.44 - 1.00 mg/dL 0.46   Sodium 135 - 145 mmol/L 141   Potassium 3.5 - 5.1 mmol/L 3.4   Chloride 98 - 111 mmol/L 111   CO2 22 - 32 mmol/L 24   Calcium 8.9 - 10.3 mg/dL 9.0   Total Protein 6.5 - 8.1 g/dL 6.5   Total Bilirubin 0.3 - 1.2 mg/dL 1.0   Alkaline Phos 38 - 126 U/L 52   AST 15 - 41 U/L 17   ALT 0 - 44 U/L 14       Latest Ref Rng & Units 07/10/2021    4:00 AM  CBC  WBC 4.0 - 10.5 K/uL 6.6   Hemoglobin 12.0 - 15.0 g/dL 11.7   Hematocrit 36.0 - 46.0 % 36.6   Platelets 150 - 400 K/uL 280     '@IMAGES'$ @  DG Chest Portable 1 View  Result Date: 07/09/2021 CLINICAL DATA:  Hypoxia.  Shortness of breath. EXAM: PORTABLE CHEST 1 VIEW COMPARISON:  Chest radiograph 09/21/2020, CT 09/18/2020 FINDINGS: Mild hyperinflation. Mild bronchial thickening. The heart is normal in size. Stable mediastinal contours subsegmental opacity in the lung bases. No pleural effusion or pneumothorax. Hardware in the lower cervical spine is partially included. IMPRESSION: 1. Mild hyperinflation and bronchial thickening  suggesting asthma, bronchitis, or COPD. 2. Subsegmental bibasilar atelectasis. Electronically Signed   By: Keith Rake M.D.   On: 07/09/2021 23:27    Assessment and plan- Patient is a 66 y.o. 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  Patient has never had these episodes as a child and therefore this is not congenital but appears as an acquired cause.  At this time I would recommend continuing supportive care and oxygen as needed.  Given that her baseline G6PD levels are normal methylene blue would have been the appropriate treatment for her methemoglobinemia  since it works faster than ascorbic acid.  I will check her levels again in the morning.  SPO2 would not be accurate in assessing her oxygen levels since they can be falsely been read as low normal around 85% on pulse oximetry.  There is a long list of medications which can cause acquired methemoglobinemia. There are case reports of Macrobid causing cyanosis and methemoglobinemia.  As such I asked the patient to hold off on taking any Macrobid in the future.  I am having pharmacy review her medication list extensively.  I will also give her a list of chemicals and medications as well as foods that she should potentially avoid that can potentially precipitate methemoglobinemia.  However typically methemoglobinemia can be associated with 1 drug but that would not be a reason to not take any other drugs which could be potentially causing methemoglobinemia.  Patient is also not on well water at home that can some times cause methemoglobinemia from nitrates in the water.  I will follow-up with her closely as an outpatient and also consider referring her to [facility] for further evaluation as an  outpatient  History of drug-induced autoimmune hemolytic anemia: That does not seem to be an active issue at this time.  Hemoglobin is stable around 11-13.  Haptoglobin levels checked 10 days ago were normal.  I am repeating haptoglobin tomorrow     Visit Diagnosis 1. Methemoglobinemia     Dr. Randa Evens, MD, MPH [facility] [phone] 07/10/2021

## 2021-07-10 NOTE — Progress Notes (Signed)
Encounter opened in error

## 2021-07-10 NOTE — Progress Notes (Signed)
PHARMACIST - PHYSICIAN COMMUNICATION  CONCERNING:  Enoxaparin (Lovenox) for DVT Prophylaxis    RECOMMENDATION: Patient was prescribed enoxaprin '40mg'$  q24 hours for VTE prophylaxis.   Filed Weights   07/09/21 2230  Weight: 88.5 kg (195 lb)    Body mass index is 30.09 kg/m.  Estimated Creatinine Clearance: 80.9 mL/min (by C-G formula based on SCr of 0.62 mg/dL).   Based on [place] patient is candidate for enoxaparin 0.'5mg'$ /kg TBW SQ every 24 hours based on BMI being >30.  DESCRIPTION: Pharmacy has adjusted enoxaparin dose per [facility] policy.  Patient is now receiving enoxaparin 0.5 mg/kg every 24 hours   Renda Rolls, PharmD, [facility] 07/10/2021 2:03 AM

## 2021-07-10 NOTE — TOC CM/SW Note (Signed)
  Transition of Care [facility]) Screening Note   Patient Details  Name: Bethany Scott Date of Birth: Nov 28, 1955   Transition of Care [facility]) CM/SW Contact:    Candie Chroman, LCSW Phone Number: 07/10/2021, 11:47 AM    Transition of Care Department [facility]) has reviewed patient and no TOC needs have been identified at this time. We will continue to monitor patient advancement through interdisciplinary progression rounds. If new patient transition needs arise, please place a TOC consult.

## 2021-07-11 LAB — COOXEMETRY PANEL
Carboxyhemoglobin: 1 % (ref 0.5–1.5)
Methemoglobin: 4 % — ABNORMAL HIGH (ref 0.0–1.5)
O2 Saturation: 84.2 %
Total hemoglobin: 12.3 g/dL (ref 12.0–16.0)
Total oxygen content: 79 %

## 2021-07-11 MED ORDER — ALPRAZOLAM 0.25 MG PO TABS
0.2500 mg | ORAL_TABLET | Freq: Two times a day (BID) | ORAL | Status: DC | PRN
Start: 2021-07-11 — End: 2021-07-11
  Administered 2021-07-11: 0.25 mg via ORAL
  Filled 2021-07-11: qty 1

## 2021-07-11 MED ORDER — ALPRAZOLAM 0.25 MG PO TABS: 0.2500 mg | ORAL_TABLET | Freq: Two times a day (BID) | ORAL | 0 refills | Status: DC | PRN

## 2021-07-11 NOTE — Discharge Summary (Signed)
Physician Discharge Summary  Bethany Scott LKT:[id number] DOB: 01/27/1955 DOA: 07/09/2021  PCP: [name], MD  Admit date: 07/09/2021 Discharge date: 07/11/2021  Admitted From: Home Disposition:  Home  Recommendations for Outpatient Follow-up:  Follow up with PCP in 1-2 weeks Follow up with hematology Dr. Janese Banks within 5 days  Home Health:No  Equipment/Devices:None   Discharge Condition:Stable  CODE STATUS:FULL  Diet recommendation: Regular  Brief/Interim Summary: Bethany Scott is a 66 y.o. 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 Pulse oximetry had also read in 70%'s at home. She was placed on supplemental oxygen and due to worsening pulse oximetry readings, ultimately placed on BiPAP. However, her lungs were clear, no infiltrate was found on CXR, and further work up including d-dimer, BNP, troponin, and blood counts were unremarkable. When ABG was drawn, PaO2 was >400 and methemoglobin level was found to be elevated at 24%. Methylene blue was administered with improvement in cyanosis and hematology was consulted, recommending admission. Dr. Ara Kussmaul admitted her early this morning.   Received methylene blue in house with good result.  Follow-up "oximetry documented near resolution of methemoglobinemia.  Interestingly the oxygen saturation on the echo oximetry panel 84% however this is not verified with pulse oximetry with good waveform.  Had a lengthy discussion with patient at time of discharge.  Okay with discharge home with close follow-up with hematology in office.  Recommend regular monitoring pulse oximetry.  Patient has a device at home.  Clear return to ED instructions provided.  Indefinite discontinuation of Macrobid.  Macrobid added to allergy list    Discharge Diagnoses:  Principal Problem:   Methemoglobinemia  Patient presented with  methemoglobinemia.  Received methylene blue.  Presumed precipitant is Macrobid.  No other new medications to explain.  Repeat methemoglobin level decreased at time of discharge.  Patient ambulating without need for supplemental oxygen.  Stable for discharge home at this time.  Clear return to ED instructions provided.  We will follow-up with hematology Dr. Janese Banks within 1 week.  Discharge Instructions  Discharge Instructions     Diet - low sodium heart healthy   Complete by: As directed    Increase activity slowly   Complete by: As directed       Allergies as of 07/11/2021       Reactions   Cephalexin Anaphylaxis   Cephalosporins Anaphylaxis   Penicillins Anaphylaxis, Other (See Comments)   Has patient had a PCN reaction causing immediate rash, facial/tongue/throat swelling, SOB or lightheadedness with hypotension: Yes Has patient had a PCN reaction causing severe rash involving mucus membranes or skin necrosis: No Has patient had a PCN reaction that required hospitalization Yes Has patient had a PCN reaction occurring within the last 10 years: No If all of the above answers are "NO", then may proceed with Cephalosporin use.   Levaquin [levofloxacin] Other (See Comments)   Hemolytic anemia due to Levaquin        Medication List     STOP taking these medications    nitrofurantoin (macrocrystal-monohydrate) 100 MG capsule Commonly known as: MACROBID       TAKE these medications    albuterol 108 (90 Base) MCG/ACT inhaler Commonly known as: VENTOLIN HFA Inhale 1-2 puffs into the lungs every 6 (six) hours as needed for wheezing or shortness of breath.   ALPRAZolam 0.25 MG tablet Commonly known as: XANAX Take 1  tablet (0.25 mg total) by mouth 2 (two) times daily as needed for anxiety.   Azelastine-Fluticasone 137-50 MCG/ACT Susp Place 2 sprays into the nose 2 (two) times a day.   busPIRone 10 MG tablet Commonly known as: BUSPAR Take 1 tablet (10 mg total) by mouth 3  (three) times daily What changed:  when to take this reasons to take this   DULoxetine 60 MG capsule Commonly known as: CYMBALTA Take 1 capsule (60 mg total) by mouth 2 (two) times daily   gabapentin 300 MG capsule Commonly known as: NEURONTIN Take 1-3 capsules (300-900 mg total) by mouth 3 (three) times daily   HYDROcodone-acetaminophen 10-325 MG tablet Commonly known as: NORCO Take 1 tablet by mouth every 4 (four) hours as needed for pain. (Take 1 tablet by mouth every 4 (four) hours as needed for Pain)   Ibuprofen-Acetaminophen 125-250 MG Tabs Take 1 tablet by mouth as needed for pain.   ipratropium 0.03 % nasal spray Commonly known as: ATROVENT Place 2 sprays into both nostrils every 12 (twelve) hours.   ketoconazole 2 % shampoo Commonly known as: NIZORAL apply three times per week, massage into scalp and leave in for 10 minutes before rinsing out What changed:  how much to take how to take this when to take this additional instructions   ketoconazole 2 % cream Commonly known as: NIZORAL Apply topically 2 (two) times daily. What changed: Another medication with the same name was changed. Make sure you understand how and when to take each.   latanoprost 0.005 % ophthalmic solution Commonly known as: XALATAN Place 1 drop into both eyes at bedtime.   levocetirizine 5 MG tablet Commonly known as: XYZAL Take 1 tablet (5 mg total) by mouth every evening   methocarbamol 750 MG tablet Commonly known as: ROBAXIN Take 750 mg by mouth 3 (three) times daily as needed for muscle spasms.   montelukast 10 MG tablet Commonly known as: SINGULAIR Take 1 tablet (10 mg total) by mouth at bedtime   omeprazole 40 MG capsule Commonly known as: PRILOSEC Take 40 mg by mouth 2 (two) times daily.   phenazopyridine 200 MG tablet Commonly known as: PYRIDIUM Take 200 mg by mouth 3 (three) times daily as needed.   promethazine 25 MG tablet Commonly known as: PHENERGAN Take 1  tablet (25 mg total) by mouth every 8 (eight) hours as needed for Nausea   Symbicort 160-4.5 MCG/ACT inhaler Generic drug: budesonide-formoterol INHALE 2 PUFFS BY MOUTH 2 TIMES DAILY        Allergies  Allergen Reactions   Cephalexin Anaphylaxis   Cephalosporins Anaphylaxis   Penicillins Anaphylaxis and Other (See Comments)    Has patient had a PCN reaction causing immediate rash, facial/tongue/throat swelling, SOB or lightheadedness with hypotension: Yes Has patient had a PCN reaction causing severe rash involving mucus membranes or skin necrosis: No Has patient had a PCN reaction that required hospitalization Yes Has patient had a PCN reaction occurring within the last 10 years: No If all of the above answers are "NO", then may proceed with Cephalosporin use.    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    Consultations: Hematology   Procedures/Studies: DG Chest Portable 1 View  Result Date: 07/09/2021 CLINICAL DATA:  Hypoxia.  Shortness of breath. EXAM: PORTABLE CHEST 1 VIEW COMPARISON:  Chest radiograph 09/21/2020, CT 09/18/2020 FINDINGS: Mild hyperinflation. Mild bronchial thickening. The heart is normal in size. Stable mediastinal contours subsegmental opacity in the  lung bases. No pleural effusion or pneumothorax. Hardware in the lower cervical spine is partially included. IMPRESSION: 1. Mild hyperinflation and bronchial thickening suggesting asthma, bronchitis, or COPD. 2. Subsegmental bibasilar atelectasis. Electronically Signed   By: Keith Rake M.D.   On: 07/09/2021 23:27      Subjective: Seen and examined at time of discharge.  Stable no distress.  Stable for discharge home.  Discharge Exam: Vitals:   07/11/21 0755 07/11/21 1434  BP: 130/67   Pulse: 82   Resp: 18   Temp: 98.2 F (36.8 C)   SpO2: 93% 95%   Vitals:   07/10/21 1943 07/11/21 0428 07/11/21 0755 07/11/21 1434  BP: 129/68 123/70 130/67   Pulse: 85 81  82   Resp: '20 20 18   '$ Temp: 98.2 F (36.8 C) 97.8 F (36.6 C) 98.2 F (36.8 C)   TempSrc: Oral Oral Oral   SpO2: 92% 94% 93% 95%  Weight:      Height:        General: Pt is alert, awake, not in acute distress Cardiovascular: RRR, S1/S2 +, no rubs, no gallops Respiratory: CTA bilaterally, no wheezing, no rhonchi Abdominal: Soft, NT, ND, bowel sounds + Extremities: no edema, no cyanosis    The results of significant diagnostics from this hospitalization (including imaging, microbiology, ancillary and laboratory) are listed below for reference.     Microbiology: No results found for this or any previous visit (from the past 240 hour(s)).   Labs: BNP (last 3 results) Recent Labs    09/17/20 1349 07/09/21 2300  BNP 37.6 8.6   Basic Metabolic Panel: Recent Labs  Lab 07/09/21 2300 07/10/21 0107 07/10/21 0400  NA 140 140 141  K 3.8 3.2* 3.4*  CL 109 110 111  CO2 '22 24 24  '$ GLUCOSE 143* 136* 116*  BUN '10 10 10  '$ CREATININE 0.62 0.61 0.46  CALCIUM 9.3 9.2 9.0   Liver Function Tests: Recent Labs  Lab 07/09/21 2300 07/10/21 0107 07/10/21 0400  AST '28 21 17  '$ ALT '15 15 14  '$ ALKPHOS 57 59 52  BILITOT 1.3* 1.1 1.0  PROT 6.7 6.9 6.5  ALBUMIN 4.1 4.1 4.0   No results for input(s): "LIPASE", "AMYLASE" in the last 168 hours. No results for input(s): "AMMONIA" in the last 168 hours. CBC: Recent Labs  Lab 07/09/21 2300 07/10/21 0107 07/10/21 0400  WBC 7.6 7.4 6.6  NEUTROABS 2.8  --   --   HGB 13.0 12.3 11.7*  HCT 41.1 38.6 36.6  MCV 98.3 96.7 96.6  PLT 285 296 280   Cardiac Enzymes: No results for input(s): "CKTOTAL", "CKMB", "CKMBINDEX", "TROPONINI" in the last 168 hours. BNP: Invalid input(s): "POCBNP" CBG: No results for input(s): "GLUCAP" in the last 168 hours. D-Dimer Recent Labs    07/09/21 2341  DDIMER 0.36   Hgb A1c No results for input(s): "HGBA1C" in the last 72 hours. Lipid Profile No results for input(s): "CHOL", "HDL", "LDLCALC", "TRIG",  "CHOLHDL", "LDLDIRECT" in the last 72 hours. Thyroid function studies No results for input(s): "TSH", "T4TOTAL", "T3FREE", "THYROIDAB" in the last 72 hours.  Invalid input(s): "FREET3" Anemia work up No results for input(s): "VITAMINB12", "FOLATE", "FERRITIN", "TIBC", "IRON", "RETICCTPCT" in the last 72 hours. Urinalysis    Component Value Date/Time   COLORURINE AMBER (A) 07/09/2021 2300   APPEARANCEUR CLEAR (A) 07/09/2021 2300   LABSPEC 1.005 07/09/2021 2300   PHURINE 7.0 07/09/2021 2300   GLUCOSEU NEGATIVE 07/09/2021 2300   HGBUR NEGATIVE 07/09/2021 2300  BILIRUBINUR NEGATIVE 07/09/2021 2300   BILIRUBINUR positive 04/05/2018 1633   KETONESUR NEGATIVE 07/09/2021 2300   PROTEINUR NEGATIVE 07/09/2021 2300   UROBILINOGEN >=8.0 (A) 04/05/2018 1633   NITRITE POSITIVE (A) 07/09/2021 2300   LEUKOCYTESUR NEGATIVE 07/09/2021 2300   Sepsis Labs Recent Labs  Lab 07/09/21 2300 07/10/21 0107 07/10/21 0400  WBC 7.6 7.4 6.6   Microbiology No results found for this or any previous visit (from the past 240 hour(s)).   Time coordinating discharge: Over 30 minutes  SIGNED:   Sidney Ace, MD  [facility] 07/11/2021, 2:43 PM Pager   If 7PM-7AM, please contact night-coverage

## 2021-07-11 NOTE — Progress Notes (Signed)
PIV removed. Discharge instructions completed. Patient verbalized understanding of medication regimen, follow up appointments and discharge instructions. Patient belongings gathered and packed to discharge.  

## 2021-07-11 NOTE — Progress Notes (Signed)
Mobility Specialist - Progress Note   07/11/21 1300  Mobility  Activity Ambulated independently in hallway  Level of Assistance Independent  Assistive Device None  Activity Response Tolerated well  $Mobility charge 1 Mobility     Merrily Brittle Mobility Specialist 07/11/21, 1:59 PM

## 2021-07-12 LAB — HAPTOGLOBIN: Haptoglobin: 25 mg/dL — ABNORMAL LOW (ref 37–355)

## 2021-07-13 ENCOUNTER — Telehealth: Payer: Self-pay | Admitting: *Deleted

## 2021-07-13 ENCOUNTER — Other Ambulatory Visit: Payer: Self-pay | Admitting: *Deleted

## 2021-07-13 DIAGNOSIS — D749 Methemoglobinemia, unspecified: Secondary | ICD-10-CM

## 2021-07-13 LAB — METHEMOGLOBIN, BLOOD: Methemoglobin, Blood: 6.2 % — ABNORMAL HIGH (ref 0.4–1.5)

## 2021-07-13 NOTE — Telephone Encounter (Signed)
[facility] got in touch with the daughter and she took the xtandi to the [facility] on 6/12 and they put it I the office. I called today to [facility] and was told that I can fax the rx to [phone]. I did fax it but also I wanted to see if they have the drug there. I was sent to person in charge but got voicemail and had to leave a message. I gave them my cell number to call back

## 2021-07-13 NOTE — Telephone Encounter (Signed)
Faxed the ref to [facility] urgent ref. For metheoglobinemia.  I have faxed the notes it is sending the paper work now.

## 2021-07-14 NOTE — Telephone Encounter (Signed)
The note below was on wrong patient. Please disregard the notes below it did not belong to this patient. The correct note was notated in correct chart

## 2021-07-16 ENCOUNTER — Other Ambulatory Visit: Payer: Self-pay

## 2021-07-16 MED ORDER — HYDROCODONE-ACETAMINOPHEN 10-325 MG PO TABS
ORAL_TABLET | ORAL | 0 refills | Status: DC
  Filled 2021-09-17: qty 150, 30d supply, fill #0

## 2021-07-16 MED ORDER — OZEMPIC (0.25 OR 0.5 MG/DOSE) 2 MG/3ML ~~LOC~~ SOPN
PEN_INJECTOR | SUBCUTANEOUS | 0 refills | Status: DC
  Filled 2021-07-16 – 2021-07-27 (×3): qty 3, 28d supply, fill #0
  Filled 2021-08-13: qty 3, fill #0
  Filled 2021-08-13: qty 3, 28d supply, fill #0

## 2021-07-16 MED ORDER — ESTRADIOL 0.1 MG/GM VA CREA
TOPICAL_CREAM | VAGINAL | 4 refills | Status: DC
Start: 2021-07-16 — End: 2021-09-21
  Filled 2021-07-16: qty 42.5, 30d supply, fill #0
  Filled 2021-08-13: qty 42.5, 30d supply, fill #1
  Filled 2021-09-16: qty 42.5, 30d supply, fill #2

## 2021-07-16 MED ORDER — LEVOCETIRIZINE DIHYDROCHLORIDE 5 MG PO TABS
5.0000 mg | ORAL_TABLET | Freq: Every evening | ORAL | 3 refills | Status: DC
  Filled 2021-07-16: qty 90, 90d supply, fill #0

## 2021-07-16 MED ORDER — HYDROCODONE-ACETAMINOPHEN 10-325 MG PO TABS: ORAL_TABLET | ORAL | 0 refills | Status: DC

## 2021-07-16 MED ORDER — MONTELUKAST SODIUM 10 MG PO TABS
10.0000 mg | ORAL_TABLET | Freq: Every day | ORAL | 4 refills | Status: DC
  Filled 2021-07-16: qty 90, 90d supply, fill #0

## 2021-07-17 ENCOUNTER — Inpatient Hospital Stay: Payer: HMO

## 2021-07-17 ENCOUNTER — Other Ambulatory Visit: Payer: Self-pay

## 2021-07-17 ENCOUNTER — Encounter: Payer: Self-pay | Admitting: Oncology

## 2021-07-17 ENCOUNTER — Inpatient Hospital Stay (HOSPITAL_BASED_OUTPATIENT_CLINIC_OR_DEPARTMENT_OTHER): Payer: HMO | Admitting: Oncology

## 2021-07-17 DIAGNOSIS — D749 Methemoglobinemia, unspecified: Secondary | ICD-10-CM

## 2021-07-17 DIAGNOSIS — D592 Drug-induced nonautoimmune hemolytic anemia: Secondary | ICD-10-CM | POA: Diagnosis not present

## 2021-07-17 LAB — CBC
HCT: 36.5 % (ref 36.0–46.0)
Hemoglobin: 11.6 g/dL — ABNORMAL LOW (ref 12.0–15.0)
MCH: 31.3 pg (ref 26.0–34.0)
MCHC: 31.8 g/dL (ref 30.0–36.0)
MCV: 98.4 fL (ref 80.0–100.0)
Platelets: 370 10*3/uL (ref 150–400)
RBC: 3.71 MIL/uL — ABNORMAL LOW (ref 3.87–5.11)
RDW: 13.1 % (ref 11.5–15.5)
WBC: 6.7 10*3/uL (ref 4.0–10.5)
nRBC: 0 % (ref 0.0–0.2)

## 2021-07-17 NOTE — Progress Notes (Signed)
Pt only concern tired all the time

## 2021-07-17 NOTE — Progress Notes (Addendum)
Hematology/Oncology Consult note [facility]  [phone] [phone]  Patient Care Team: [name], MD as PCP - General (Family Medicine) Ammie Dalton, Okey Regal, MD (Obstetrics and Gynecology)   Name of the patient: Bethany Scott  [id number]  10-09-1955   Date of visit: 07/17/21  Diagnosis- 1.  History of drug-induced hemolytic anemia 2.  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  She also has a history of iron deficiency and folate acid deficiency.  Patient was admitted for bilateral pneumonia in August 2022 at that time she was treated with antibiotics.  At that time she was evaluated for a hemoglobin of 6.6 and underwent a comprehensive work-up which showed normal ferritin of 183 with a normal iron saturation folate level normal at 18.8 B12 levels normal at 494 immunoglobulins were normal haptoglobin was low initially at less than 10 and subsequently in May 2023 it was still low at 16 LDH has been normal.  She has had G6PD levels checked 3 different occasions over the last 1 month and they have been within normal limits.  Coombs test in April 2023 was negative.  PNH profile testing negative.  Her hemoglobin which was 6.6 in August 2022 subsequently recovered to 10.5-11 over the next 6 months.  Hemoglobinopathy evaluation negative. 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 She did not require steroids or other treatment for her hemolytic anemia.    Patient presented to the ER with symptoms of shortness of breath and her lips turning purple in June 2023.  She was given Azo and Macrobid recently for UTI.Patient had a coexemetry panel performed in the hospital which showed an elevated methemoglobin level of  24.7%.  Carboxyhemoglobin level less than 0.3 and oxygen saturation on that sample was 95.4% patient was given methylene blue with improvement of her cyanosis.  No exposure to well water.  No other recent changes in medications.  Interval history-reports doing well overall.  She has chronic fatigue which is remained unchanged but otherwise denies other complaints at this time  ECOG PS- 1 Pain scale- 0   Review of systems- Review of Systems  Constitutional:  Positive for malaise/fatigue. Negative for chills, fever and weight loss.  HENT:  Negative for congestion, ear discharge and nosebleeds.   Eyes:  Negative for blurred vision.  Respiratory:  Negative for cough, hemoptysis, sputum production, shortness of breath and wheezing.   Cardiovascular:  Negative for chest pain, palpitations, orthopnea and claudication.  Gastrointestinal:  Negative for abdominal pain, blood in stool, constipation, diarrhea, heartburn, melena, nausea and vomiting.  Genitourinary:  Negative for dysuria, flank pain, frequency, hematuria and urgency.  Musculoskeletal:  Negative for back pain, joint pain and myalgias.  Skin:  Negative for rash.  Neurological:  Negative for dizziness, tingling, focal weakness, seizures, weakness and headaches.  Endo/Heme/Allergies:  Does not bruise/bleed easily.  Psychiatric/Behavioral:  Negative for depression and suicidal ideas. The patient does not have insomnia.       Allergies  Allergen Reactions   Cephalexin Anaphylaxis   Cephalosporins Anaphylaxis   Macrobid [Nitrofurantoin Macrocrystal] Other (See Comments)    Causes methemoglobinemia   Penicillins Anaphylaxis and Other (See Comments)    Has patient had a PCN reaction causing immediate rash, facial/tongue/throat swelling, SOB or lightheadedness with hypotension: Yes Has patient had a  PCN reaction causing severe rash involving mucus membranes or skin necrosis: No Has patient had a PCN reaction that required hospitalization  Yes Has patient had a PCN reaction occurring within the last 10 years: No If all of the above answers are "NO", then may proceed with Cephalosporin use.    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     Past Medical History:  Diagnosis Date   Allergy    Anemia    Anxiety    Arthritis    OA Knee; non-specific autoimmune process followed by [facility]/Rheumatology.   Asthma    Blood transfusion without reported diagnosis YRS AGO   COVID-19 03/2018   Depression    Gastric ulcer, unspecified as acute or chronic, without mention of hemorrhage, perforation, or obstruction YRS AGO   GERD (gastroesophageal reflux disease)    Hyperlipidemia    Hypertension    Migraine    hx of migraines   Obesity, unspecified    Other chronic cystitis    Personal history of colonic polyps    Pneumonia    PONV (postoperative nausea and vomiting)    NAUSEA, SCOPOLAMINE PATCH HELPED WITH LAST SURGERIES   Pre-diabetes      Past Surgical History:  Procedure Laterality Date   ABDOMINAL HYSTERECTOMY  01/25/1994   uterine fibroids; DUB; cervical dysplasia; ovaries resected two years later.     APPENDECTOMY     BILATERAL OOPHORECTOMY  2001   CESAREAN SECTION     x 2   COLON SURGERY  YRS AGO   SIGMOIDECTOMY   Colonoscopy  09/08/2012   diverticulosis, hemorrhoids.  [name].  Symptoms:  anemia, hemoccult +.   ESOPHAGOGASTRODUODENOSCOPY  09/08/2012   +HH.  [name].  Symptoms: anemia, hemoccult +.   HIP ARTHROSCOPY Right    LAPAROSCOPIC GASTRIC SLEEVE RESECTION N/A 03/01/2016   Procedure: LAPAROSCOPIC GASTRIC SLEEVE RESECTION, UPPER ENDO;  Surgeon: [name], MD;  Location: [facility];  Service: General;  Laterality: N/A;   LAPAROSCOPIC LYSIS OF ADHESIONS N/A 02/10/2016   Procedure: LAPAROSCOPIC LYSIS OF ADHESIONS;  Surgeon: [name], MD;  Location: [facility];  Service: General;  Laterality: N/A;   LAPAROSCOPY N/A 02/10/2016   Procedure: LAPAROSCOPY DIAGNOSTIC;   Surgeon: [name], MD;  Location: [facility];  Service: General;  Laterality: N/A;   NASAL SEPTUM SURGERY     NECK SURGERY     Rheumatology Consult  11/26/2011   multiple arthralgias. Autoimmune labs negative.  Rx for Plaquenil for non-specific autoimmune process.  [name].   SHOULDER ARTHROSCOPY WITH OPEN ROTATOR CUFF REPAIR AND DISTAL CLAVICLE ACROMINECTOMY Right 01/13/2021   Procedure: SHOULDER ARTHROSCOPY WITH OPEN ROTATOR CUFF REPAIR AND DISTAL CLAVICLE ACROMINECTOMY;  Surgeon: Thornton Park, MD;  Location: [facility];  Service: Orthopedics;  Laterality: Right;   SHOULDER ARTHROSCOPY WITH SUBACROMIAL DECOMPRESSION Right 01/13/2021   Procedure: SHOULDER ARTHROSCOPY WITH SUBACROMIAL DECOMPRESSION, BICEPS TENOTOMY;  Surgeon: Thornton Park, MD;  Location: [facility];  Service: Orthopedics;  Laterality: Right;   SIGMOID RESECTION / RECTOPEXY     SPINE SURGERY     TONSILLECTOMY AND ADENOIDECTOMY     TOTAL HIP ARTHROPLASTY Right 09/27/2017   Procedure: TOTAL HIP ARTHROPLASTY ANTERIOR APPROACH;  Surgeon: [name], MD;  Location: [facility];  Service: Orthopedics;  Laterality: Right;   TOTAL HIP ARTHROPLASTY Left 12/27/2017   Procedure: TOTAL HIP ARTHROPLASTY ANTERIOR APPROACH;  Surgeon: [name], MD;  Location: [facility];  Service: Orthopedics;  Laterality: Left;    Social History  Socioeconomic History   Marital status: Married    Spouse name: Not on file   Number of children: 2   Years of education: 32   Highest education level: Not on file  Occupational History   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      Children: 2 sons (24, 53); 2 grandchildren  (37yo Atlast, 5 yo August)      Lives: with husband, [name]/son.      Employment: check in at [employer] in [place] x 2004; loves job.      Tobacco abuse:  Former smoker.      Alcohol:  None; holidays only      Exercise: none in 2017     Seatbelt: 100%; no texting   Social Determinants of Health   Financial Resource Strain: Not on file  Food Insecurity: Not on file  Transportation Needs: Not on file  Physical Activity: Not on file  Stress: Not on file  Social Connections: Not on file  Intimate Partner Violence: Not on file    Family History  Problem Relation Age of Onset   Bipolar disorder Father    Transient ischemic attack Father    Dementia Father        secondary to head trauma   Hypertension Father    Diabetes Father    CAD Father    Cancer Mother 88       lung   Migraines Mother    Hypothyroidism Mother    Hypertension Sister    Atrial fibrillation Sister    Hypothyroidism Sister    Alcohol abuse Brother    Hypertension Brother    Alcohol abuse Brother    Atrial fibrillation Maternal Grandmother    COPD Maternal Grandmother    Diabetes Maternal Grandmother    Hyperlipidemia Maternal Grandmother    Diabetes Maternal Grandfather    Diabetes Paternal Grandfather    Heart disease Paternal Grandfather    Breast cancer Sister 54     Current Outpatient Medications:    albuterol (VENTOLIN HFA) 108 (90 Base) MCG/ACT inhaler, Inhale 1-2 puffs into the lungs every 6 (six) hours as needed for wheezing or shortness of breath., Disp: 1 each, Rfl: 0   ALPRAZolam (XANAX) 0.25 MG tablet, Take 1 tablet (0.25 mg total) by mouth 2 (two) times daily as needed for anxiety., Disp: 20 tablet, Rfl:  0   Azelastine-Fluticasone 137-50 MCG/ACT SUSP, Place 2 sprays into the nose 2 (two) times a day., Disp: 23 g, Rfl: 0   budesonide-formoterol (SYMBICORT) 160-4.5 MCG/ACT inhaler, INHALE 2 PUFFS BY MOUTH 2 TIMES DAILY, Disp: 10.2 g, Rfl: 2   busPIRone (BUSPAR) 10 MG tablet, Take 1 tablet (10 mg total) by mouth 3 (three) times daily (Patient taking differently: Take 10 mg by  mouth daily as needed (anxiety).), Disp: 270 tablet, Rfl: 1   DULoxetine (CYMBALTA) 60 MG capsule, Take 1 capsule (60 mg total) by mouth 2 (two) times daily, Disp: 180 capsule, Rfl: 3   gabapentin (NEURONTIN) 300 MG capsule, Take 1-3 capsules (300-900 mg total) by mouth 3 (three) times daily, Disp: 810 capsule, Rfl: 3   HYDROcodone-acetaminophen (NORCO) 10-325 MG tablet, Take 1 tablet by mouth every 4 (four) hours as needed for Pain, Disp: 150 tablet, Rfl: 0   Ibuprofen-Acetaminophen 125-250 MG TABS, Take 1 tablet by mouth as needed for pain., Disp: , Rfl:    ketoconazole (NIZORAL) 2 % cream, Apply topically 2 (two) times daily., Disp: , Rfl:    ketoconazole (NIZORAL) 2 % shampoo, apply three times per week, massage into scalp and leave in for 10 minutes before rinsing out (Patient taking differently: Apply 1 application  topically every 14 (fourteen) days. massage into scalp and leave in for 10 minutes before rinsing out), Disp: 120 mL, Rfl: 11   latanoprost (XALATAN) 0.005 % ophthalmic solution, Place 1 drop into both eyes at bedtime., Disp: , Rfl:    levocetirizine (XYZAL) 5 MG tablet, Take 1 tablet (5 mg total) by mouth every evening, Disp: 90 tablet, Rfl: 4   methocarbamol (ROBAXIN) 750 MG tablet, Take 750 mg by mouth 3 (three) times daily as needed for muscle spasms., Disp: , Rfl:    montelukast (SINGULAIR) 10 MG tablet, Take 1 tablet (10 mg total) by mouth at bedtime, Disp: 90 tablet, Rfl: 4   omeprazole (PRILOSEC) 40 MG capsule, Take 40 mg by mouth 2 (two) times daily., Disp: , Rfl:    promethazine (PHENERGAN) 25 MG tablet,  Take 1 tablet (25 mg total) by mouth every 8 (eight) hours as needed for Nausea, Disp: 20 tablet, Rfl: 0   estradiol (ESTRACE) 0.1 MG/GM vaginal cream, Place 2 g vaginally twice a week (Patient not taking: Reported on 07/17/2021), Disp: 42.5 g, Rfl: 4   [START ON 09/24/2021] HYDROcodone-acetaminophen (NORCO) 10-325 MG tablet, Take 1 tablet by mouth every 4 (four) hours as needed for Pain (Patient not taking: Reported on 07/17/2021), Disp: 150 tablet, Rfl: 0   [START ON 08/25/2021] HYDROcodone-acetaminophen (NORCO) 10-325 MG tablet, Take 1 tablet by mouth every 4 (four) hours as needed for Pain (Patient not taking: Reported on 07/17/2021), Disp: 150 tablet, Rfl: 0   [START ON 07/25/2021] HYDROcodone-acetaminophen (NORCO) 10-325 MG tablet, Take 1 tablet by mouth every 4 (four) hours as needed for Pain (Patient not taking: Reported on 07/17/2021), Disp: 150 tablet, Rfl: 0   ipratropium (ATROVENT) 0.03 % nasal spray, Place 2 sprays into both nostrils every 12 (twelve) hours. (Patient not taking: Reported on 07/09/2021), Disp: 30 mL, Rfl: 0   levocetirizine (XYZAL) 5 MG tablet, Take 1 tablet (5 mg total) by mouth every evening (Patient not taking: Reported on 07/17/2021), Disp: 90 tablet, Rfl: 3   montelukast (SINGULAIR) 10 MG tablet, Take 1 tablet (10 mg total) by mouth at bedtime (Patient not taking: Reported on 07/17/2021), Disp: 90 tablet, Rfl: 4   phenazopyridine (PYRIDIUM) 200 MG tablet, Take 200 mg by mouth 3 (three) times daily as needed. (Patient not taking: Reported on 07/17/2021), Disp: , Rfl:    Semaglutide,0.25 or 0.'5MG'$ /DOS, (OZEMPIC, 0.25 OR 0.5 MG/DOSE,) 2 MG/3ML SOPN, Inject 0.375 mLs (0.25 mg total) subcutaneously once a week (Patient not taking: Reported on 07/17/2021), Disp: 3 mL, Rfl: 0  Physical exam:  Physical Exam Constitutional:      General: She is  not in acute distress. Cardiovascular:     Rate and Rhythm: Normal rate and regular rhythm.     Heart sounds: Normal heart sounds.  Pulmonary:      Effort: Pulmonary effort is normal.     Breath sounds: Normal breath sounds.  Abdominal:     General: Bowel sounds are normal.     Palpations: Abdomen is soft.  Skin:    General: Skin is warm and dry.  Neurological:     Mental Status: She is alert and oriented to person, place, and time.        Latest Ref Rng & Units 07/10/2021    4:00 AM  CMP  Glucose 70 - 99 mg/dL 116   BUN 8 - 23 mg/dL 10   Creatinine 0.44 - 1.00 mg/dL 0.46   Sodium 135 - 145 mmol/L 141   Potassium 3.5 - 5.1 mmol/L 3.4   Chloride 98 - 111 mmol/L 111   CO2 22 - 32 mmol/L 24   Calcium 8.9 - 10.3 mg/dL 9.0   Total Protein 6.5 - 8.1 g/dL 6.5   Total Bilirubin 0.3 - 1.2 mg/dL 1.0   Alkaline Phos 38 - 126 U/L 52   AST 15 - 41 U/L 17   ALT 0 - 44 U/L 14       Latest Ref Rng & Units 07/17/2021    8:19 AM  CBC  WBC 4.0 - 10.5 K/uL 6.7   Hemoglobin 12.0 - 15.0 g/dL 11.6   Hematocrit 36.0 - 46.0 % 36.5   Platelets 150 - 400 K/uL 370     No images are attached to the encounter.  DG Chest Portable 1 View  Result Date: 07/09/2021 CLINICAL DATA:  Hypoxia.  Shortness of breath. EXAM: PORTABLE CHEST 1 VIEW COMPARISON:  Chest radiograph 09/21/2020, CT 09/18/2020 FINDINGS: Mild hyperinflation. Mild bronchial thickening. The heart is normal in size. Stable mediastinal contours subsegmental opacity in the lung bases. No pleural effusion or pneumothorax. Hardware in the lower cervical spine is partially included. IMPRESSION: 1. Mild hyperinflation and bronchial thickening suggesting asthma, bronchitis, or COPD. 2. Subsegmental bibasilar atelectasis. Electronically Signed   By: Keith Rake M.D.   On: 07/09/2021 23:27     Assessment and plan- Patient is a 66 y.o. female who is here for follow-up of following issues:  Methemoglobinemia: Presently patient is doing well cyanosis has resolved.  She is not on Macrobid.  I will look into sending genetic testing for this as well although it would be unusual for her to  manifest methemoglobinemia late in her life if she truly had a genetic reason.  She is currently awaiting her second opinion appointment at [facility].  2.  Prior history of drug-induced autoimmune hemolytic anemia: Her haptoglobin is low at this time which could have been secondary to her recent episode of methemoglobinemia but overall hemoglobin is stable around 11-12.  I will plan to repeat CBC reticulocyte count haptoglobin and B12 levels in 2 months and see her thereafter.  Patient is currently on a multivitamin.  I do not think that her symptoms of fatigue are secondary to her mild anemia and her recent episode of methemoglobinemia.   Visit Diagnosis 1. Methemoglobinemia      Dr. Randa Evens, MD, MPH [facility] [phone] 07/17/2021 8:56 AM

## 2021-07-21 ENCOUNTER — Encounter: Payer: Self-pay | Admitting: Oncology

## 2021-07-21 ENCOUNTER — Other Ambulatory Visit: Payer: Self-pay

## 2021-07-21 ENCOUNTER — Telehealth: Payer: Self-pay | Admitting: *Deleted

## 2021-07-21 MED ORDER — HYDROCODONE-ACETAMINOPHEN 10-325 MG PO TABS
ORAL_TABLET | ORAL | 0 refills | Status: DC
  Filled 2021-07-24: qty 150, 30d supply, fill #0

## 2021-07-21 NOTE — Telephone Encounter (Signed)
Patient called stating that her O2 sat has dropped back into the 80's since Saturday she is unable to get it above 84%. She is not severely shortness of breath, "just a little bit, but I am anxious" She is asking if she needs to go to ER, be seen in clinic and get more Methylene Blue or what she needs to do. Please advise

## 2021-07-22 LAB — BLOOD GAS, ARTERIAL
Acid-Base Excess: 0.5 mmol/L (ref 0.0–2.0)
Bicarbonate: 19.5 mmol/L — ABNORMAL LOW (ref 20.0–28.0)
Delivery systems: POSITIVE
Expiratory PAP: 8 cmH2O
FIO2: 100 %
Inspiratory PAP: 14 cmH2O
O2 Saturation: 96.1 %
Patient temperature: 37
RATE: 10 resp/min
pCO2 arterial: 19 mmHg — CL (ref 32–48)
pH, Arterial: 7.62 (ref 7.35–7.45)
pO2, Arterial: 424 mmHg — ABNORMAL HIGH (ref 83–108)

## 2021-07-24 ENCOUNTER — Other Ambulatory Visit: Payer: Self-pay

## 2021-07-27 ENCOUNTER — Other Ambulatory Visit: Payer: Self-pay

## 2021-08-09 ENCOUNTER — Other Ambulatory Visit: Payer: Self-pay

## 2021-08-09 MED ORDER — WEGOVY 0.25 MG/0.5ML ~~LOC~~ SOAJ
SUBCUTANEOUS | 0 refills | Status: DC
  Filled 2021-08-09 – 2021-08-13 (×2): qty 2, 28d supply, fill #0
  Filled 2021-10-13: qty 2, fill #0

## 2021-08-10 ENCOUNTER — Other Ambulatory Visit: Payer: Self-pay

## 2021-08-13 ENCOUNTER — Other Ambulatory Visit: Payer: Self-pay

## 2021-08-13 MED ORDER — PROMETHAZINE HCL 25 MG PO TABS
ORAL_TABLET | ORAL | 0 refills | Status: DC
  Filled 2021-08-13: qty 20, 7d supply, fill #0

## 2021-08-17 ENCOUNTER — Ambulatory Visit
Admission: RE | Admit: 2021-08-17 | Discharge: 2021-08-17 | Disposition: A | Discharge status: Home / Self Care | Payer: HMO | Source: Ambulatory Visit | Attending: Family Medicine | Admitting: Family Medicine

## 2021-08-17 ENCOUNTER — Other Ambulatory Visit: Payer: Self-pay

## 2021-08-17 DIAGNOSIS — Z780 Asymptomatic menopausal state: Secondary | ICD-10-CM | POA: Diagnosis present

## 2021-08-17 DIAGNOSIS — Z1231 Encounter for screening mammogram for malignant neoplasm of breast: Secondary | ICD-10-CM

## 2021-08-17 MED ORDER — HYDROCODONE-ACETAMINOPHEN 10-325 MG PO TABS
ORAL_TABLET | ORAL | 0 refills | Status: DC
  Filled 2021-08-21: qty 150, 25d supply, fill #0

## 2021-08-18 ENCOUNTER — Other Ambulatory Visit: Payer: Self-pay

## 2021-08-18 MED ORDER — METFORMIN HCL 500 MG PO TABS
ORAL_TABLET | ORAL | 11 refills | Status: DC
  Filled 2021-08-18: qty 60, 30d supply, fill #0
  Filled 2021-10-13: qty 60, 30d supply, fill #1

## 2021-08-21 ENCOUNTER — Other Ambulatory Visit: Payer: Self-pay

## 2021-08-26 ENCOUNTER — Other Ambulatory Visit: Payer: Self-pay | Admitting: Family Medicine

## 2021-08-26 DIAGNOSIS — R928 Other abnormal and inconclusive findings on diagnostic imaging of breast: Secondary | ICD-10-CM

## 2021-08-26 DIAGNOSIS — N63 Unspecified lump in unspecified breast: Secondary | ICD-10-CM

## 2021-09-01 ENCOUNTER — Other Ambulatory Visit: Payer: Self-pay

## 2021-09-02 ENCOUNTER — Encounter: Payer: Self-pay | Admitting: Oncology

## 2021-09-02 ENCOUNTER — Ambulatory Visit
Admission: RE | Admit: 2021-09-02 | Discharge: 2021-09-02 | Disposition: A | Discharge status: Home / Self Care | Payer: HMO | Source: Ambulatory Visit | Attending: Family Medicine | Admitting: Family Medicine

## 2021-09-02 DIAGNOSIS — N63 Unspecified lump in unspecified breast: Secondary | ICD-10-CM | POA: Insufficient documentation

## 2021-09-02 DIAGNOSIS — R928 Other abnormal and inconclusive findings on diagnostic imaging of breast: Secondary | ICD-10-CM

## 2021-09-03 ENCOUNTER — Ambulatory Visit: Payer: HMO | Attending: Family Medicine | Admitting: Physical Therapy

## 2021-09-03 ENCOUNTER — Encounter: Payer: Self-pay | Admitting: Physical Therapy

## 2021-09-03 DIAGNOSIS — R293 Abnormal posture: Secondary | ICD-10-CM | POA: Insufficient documentation

## 2021-09-03 DIAGNOSIS — G8929 Other chronic pain: Secondary | ICD-10-CM | POA: Insufficient documentation

## 2021-09-03 DIAGNOSIS — M533 Sacrococcygeal disorders, not elsewhere classified: Secondary | ICD-10-CM | POA: Diagnosis present

## 2021-09-03 DIAGNOSIS — M542 Cervicalgia: Secondary | ICD-10-CM | POA: Diagnosis present

## 2021-09-03 DIAGNOSIS — R2689 Other abnormalities of gait and mobility: Secondary | ICD-10-CM | POA: Diagnosis present

## 2021-09-03 DIAGNOSIS — M25512 Pain in left shoulder: Secondary | ICD-10-CM | POA: Insufficient documentation

## 2021-09-03 DIAGNOSIS — R278 Other lack of coordination: Secondary | ICD-10-CM | POA: Insufficient documentation

## 2021-09-03 DIAGNOSIS — M5459 Other low back pain: Secondary | ICD-10-CM | POA: Insufficient documentation

## 2021-09-03 NOTE — Therapy (Signed)
OUTPATIENT PHYSICAL THERAPY EVALUATION   Patient Name: Bethany Scott MRN: [id number] DOB:[birth year], 66 y.o., female Today's Date: 09/03/2021   PT End of Session - 09/03/21 1426     Visit Number 1    Number of Visits 10    Date for PT Re-Evaluation 11/12/21    PT Start Time 9629    PT Stop Time 1500    PT Time Calculation (min) 40 min             Past Medical History:  Diagnosis Date   Allergy    Anemia    Anxiety    Arthritis    OA Knee; non-specific autoimmune process followed by [facility]/Rheumatology.   Asthma    Blood transfusion without reported diagnosis YRS AGO   COVID-19 03/2018   Depression    Gastric ulcer, unspecified as acute or chronic, without mention of hemorrhage, perforation, or obstruction YRS AGO   GERD (gastroesophageal reflux disease)    Hyperlipidemia    Hypertension    Methemoglobinemia    Migraine    hx of migraines   Obesity, unspecified    Other chronic cystitis    Personal history of colonic polyps    Pneumonia    PONV (postoperative nausea and vomiting)    NAUSEA, SCOPOLAMINE PATCH HELPED WITH LAST SURGERIES   Pre-diabetes    Past Surgical History:  Procedure Laterality Date   ABDOMINAL HYSTERECTOMY  01/25/1994   uterine fibroids; DUB; cervical dysplasia; ovaries resected two years later.     APPENDECTOMY     BILATERAL OOPHORECTOMY  2001   CESAREAN SECTION     x 2   COLON SURGERY  YRS AGO   SIGMOIDECTOMY   Colonoscopy  09/08/2012   diverticulosis, hemorrhoids.  [name].  Symptoms:  anemia, hemoccult +.   ESOPHAGOGASTRODUODENOSCOPY  09/08/2012   +HH.  [name].  Symptoms: anemia, hemoccult +.   HIP ARTHROSCOPY Right    LAPAROSCOPIC GASTRIC SLEEVE RESECTION N/A 03/01/2016   Procedure: LAPAROSCOPIC GASTRIC SLEEVE RESECTION, UPPER ENDO;  Surgeon: [name], MD;  Location: [facility];  Service: General;  Laterality: N/A;   LAPAROSCOPIC LYSIS OF ADHESIONS N/A 02/10/2016   Procedure: LAPAROSCOPIC LYSIS OF ADHESIONS;  Surgeon: [name], MD;  Location: [facility];  Service: General;  Laterality: N/A;   LAPAROSCOPY N/A 02/10/2016   Procedure: LAPAROSCOPY DIAGNOSTIC;  Surgeon: [name], MD;  Location: [facility];  Service: General;  Laterality: N/A;   NASAL SEPTUM SURGERY     NECK SURGERY     Rheumatology Consult  11/26/2011   multiple arthralgias. Autoimmune labs negative.  Rx for Plaquenil for non-specific autoimmune process.  [name].   SHOULDER ARTHROSCOPY WITH OPEN ROTATOR CUFF REPAIR AND DISTAL CLAVICLE ACROMINECTOMY Right 01/13/2021   Procedure: SHOULDER ARTHROSCOPY WITH OPEN ROTATOR CUFF REPAIR AND DISTAL CLAVICLE ACROMINECTOMY;  Surgeon: Thornton Park, MD;  Location: [facility];  Service: Orthopedics;  Laterality: Right;   SHOULDER ARTHROSCOPY WITH SUBACROMIAL DECOMPRESSION Right 01/13/2021   Procedure: SHOULDER ARTHROSCOPY WITH SUBACROMIAL DECOMPRESSION, BICEPS TENOTOMY;  Surgeon: Thornton Park, MD;  Location: [facility];  Service: Orthopedics;  Laterality: Right;   SIGMOID RESECTION / RECTOPEXY     SPINE SURGERY     TONSILLECTOMY AND ADENOIDECTOMY     TOTAL HIP ARTHROPLASTY Right 09/27/2017   Procedure: TOTAL HIP ARTHROPLASTY ANTERIOR APPROACH;  Surgeon: [name], MD;  Location: [facility];  Service: Orthopedics;  Laterality: Right;   TOTAL HIP ARTHROPLASTY Left 12/27/2017   Procedure: TOTAL HIP ARTHROPLASTY ANTERIOR APPROACH;  Surgeon: Rudene Christians,  Legrand Como, MD;  Location: [facility];  Service: Orthopedics;  Laterality: Left;   Patient Active Problem List   Diagnosis Date Noted   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   Frequent episodes of sinusitis 05/19/2021   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[place]) 03/27/2021   Community acquired pneumonia of left lung    Acute respiratory failure with hypoxia ([place]) 09/18/2020   Sepsis ([place]) 09/17/2020   Status post total hip replacement, 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   Status post total hip replacement, right 09/27/2017   Incomplete emptying of bladder 02/16/2017   Chronic cystitis 02/08/2017    Hematuria, microscopic 02/08/2017   History of nephrolithiasis 02/08/2017   Pelvic pain in female 02/08/2017   Chronic venous insufficiency 01/06/2017   Varicose veins of leg with pain, right 12/31/2016   GERD (gastroesophageal reflux disease) 03/01/2016   History of gastric bypass 03/01/2016   Hip arthritis 05/29/2015   Degenerative joint disease (DJD) of hip 07/24/2014   DJD (degenerative joint disease) of knee 06/17/2014   Greater trochanteric bursitis of both hips 06/17/2014   Degenerative joint disease of sacroiliac joint ([place]) 06/17/2014   Facet syndrome, lumbar 06/17/2014   DDD (degenerative disc disease), lumbar 06/17/2014   Low serum vitamin D 06/08/2013   Sinusitis, acute, maxillary 02/08/2012   Migraines 02/08/2012   Depression with anxiety 02/08/2012   Essential hypertension, benign 02/08/2012   Pure hypercholesterolemia 02/08/2012   Diabetes mellitus type 2 in obese ([place]) 02/08/2012    PCP: [name] MD   REFERRING PROVIDER: [name] MD   REFERRING DIAG: Urge Incontinence  Rationale for Evaluation and Treatment Rehabilitation  THERAPY DIAG:  Abnormal posture  Sacrococcygeal disorders, not elsewhere classified  Other abnormalities of gait and mobility  Other lack of coordination  ONSET DATE:  6 years +  SUBJECTIVE:                                                                                                                                                                                           SUBJECTIVE STATEMENT: 1) Straining and difficulty emptying her bladder/ SUI :   Pt has leakage with laughing, coughing, sneezing.    Pt has been dealing with incontinence for 6+ years.  Pt changes pads 2 x day. Pt is now using continence undies and only changes once. Pt does not leak that much. Pt is not emptying all the way and she has to think about peeing. Then it stops and she has to wait and she has to push again.  Starting at 66 years of age, pt  has to get her balder stretches for a few years. Pt also got cystitis a lot in her  66s.  Pt has been taking medications for cystitis and is not able to take these urological medications anymore due to a recent Dx of Methemoglobinemia.  Denied straining with bowel movements.   2) CLBP: Pain level  9/10 occurs over work , overlift. Radiating pain to back of knee B but more on L side. Pain occurs with getting up from a deep chair, walking > 1 hour                 3) neck and shoulder pain: neck surgery fusion 10 years. Neck pain today 6/10, radiating shoulder L, hands have been going numb, R rotator cuff repair. Sleeps on her back, sometimes on her side. Neck and shoulder pain with vacuuming, mopping.     PERTINENT HISTORY:  oopherectomy, hysterectomy, gastric bypass, neck surgery, colon resection to remove polyp, sedentary work job , Hx of fall onto tailbone, B THA,  PAIN:  Are you having pain? Yes: 7/10 LBP , neck and shoulders    PRECAUTIONS: None  WEIGHT BEARING RESTRICTIONS No  FALLS:  Has patient fallen in last 6 months? No  LIVING ENVIRONMENT: Lives with: lives with their family Lives in: House/apartment Stairs: Yes: Internal: 1 flight rails, External 5 steps with rail    OCCUPATION:    Investment banker, operational at [employer]: Independent  PATIENT GOALS   Improve incontinence, get stronger down there    OBJECTIVE:    [facility] PT Assessment - 09/03/21 1459       Observation/Other Assessments   Observations ankles crossed,      AROM   Overall AROM Comments L sideflexion/ rotation less than R and caused radiating LBP      Strength   Overall Strength Comments R knee ext/ flex 3/5, L 5/5      Palpation   SI assessment  L iliac crest / R shoulder      Bed Mobility   Bed Mobility --   head lifting, breathholding     Ambulation/Gait   Gait Comments 1.22 m/s , L hip hike, decreased R stance              [facility] Adult PT Treatment/Exercise -  09/03/21 1459       Therapeutic Activites    Therapeutic Activities Other Therapeutic Activities    Other Therapeutic Activities see education section      Neuro Re-ed    Neuro Re-ed Details  cued for body mechanics to minmize leakage and straining pelvic floor               HOME EXERCISE PROGRAM: See pt instruction section    ASSESSMENT:  CLINICAL IMPRESSION:                Pt is a  66 yo  who presents / straining and difficulty emptying her bladder/ SUI, CLBP, and neck/ shoulder pain  which impact QOL, ADL, and community activities.   Pt's musculoskeletal assessment revealed uneven pelvic girdle and shoulder height, asymmetries to gait pattern, limited spinal /pelvic mobility with reproduction of L radiating LBP with L sideflexion and rotation, dyscoordination and strength of pelvic floor mm, hip weakness, and poor body mechanics which places strain on the abdominal/pelvic floor mm. These are deficits that indicate an ineffective intraabdominal pressure system associated with increased risk for pt's Sx.    Pt was provided education on etiology of Sx with anatomy, physiology explanation with images along with the benefits of customized pelvic PT Tx based  on pt's medical conditions and musculoskeletal deficits.  Explained the physiology of deep core mm coordination and roles of pelvic floor function in urination, defecation, sexual function, and postural control with deep core mm system.   Regional interdependent approaches Bethany yield greater benefits in pt's POC.    Following Tx today which pt tolerated without complaints, pt demo'd IND with body mechanics with sit to stand and getting in/ out of bed with less straining of pelvic floor. Plan to address uneven pelvic/ shoulder height at next session. Plan to ass abdominal scar restrictions at next session given pt's multiple surgeries ( see Pertinent Hx section)   Pt benefit from skilled PT.    OBJECTIVE IMPAIRMENTS decreased  activity tolerance, decreased coordination, decreased endurance, decreased mobility, difficulty walking, decreased ROM, decreased strength, decreased safety awareness, hypomobility, increased muscle spasms, impaired flexibility, improper body mechanics, postural dysfunction, and pain and scar restrictions   ACTIVITY LIMITATIONS   community, work, interpersonal   PARTICIPATION LIMITATIONS:  Neck and shoulder pain with vacuuming, mopping.  CLBP impacts getting up from a deep chair, walking > 1 hour    PERSONAL FACTORS  affecting patient's functional outcome: oopherectomy, hysterectomy, gastric bypass, neck surgery, colon resection to remove polyp, sedentary work job,  Hx of fall onto tailbone    REHAB POTENTIAL: Good   CLINICAL DECISION MAKING: Evolving/moderate complexity   EVALUATION COMPLEXITY: Moderate    PATIENT EDUCATION:    Education details: Showed pt anatomy images. Explained muscles attachments/ connection, physiology of deep core system/ spinal- thoracic-pelvis-lower kinetic chain as they relate to pt's presentation, Sx, and past Hx. Explained what and how these areas of deficits need to be restored to balance and function    See Therapeutic activity / neuromuscular re-education section   Person educated: Patient Education method: Explanation, Demonstration, Tactile cues, Verbal cues, and Handouts Education comprehension: verbalized understanding, returned demonstration, verbal cues required, tactile cues required, and needs further education     PLAN: PT FREQUENCY: 1x/week   PT DURATION: 10 weeks   PLANNED INTERVENTIONS: Therapeutic exercises, Therapeutic activity, Neuromuscular re-education, Balance training, Gait training, Patient/Family education, Self Care, Joint mobilization, Spinal mobilization, Moist heat, Taping, and Manual therapy.   PLAN FOR NEXT SESSION: See clinical impression for plan     GOALS: Goals reviewed with patient? Yes  SHORT TERM GOALS: Target  date: 10/01/2021    Pt Bethany demo IND with HEP                    Baseline: Not IND            Goal status: INITIAL   LONG TERM GOALS: Target date: 11/12/2021     Pt Bethany demo proper deep core coordination without chest breathing and optimal excursion of diaphragm/pelvic floor in order to promote spinal stability and pelvic floor function  Baseline: dyscoordination Goal status: INITIAL  2.  Pt Bethany demo > 5 pt change on FOTO  to improve QOL and function  Urinary Problem baseline -58 pts  PFDI Urinary  baseline -38 pts    Goal status: INITIAL  3.  Pt Bethany demo proper body mechanics in against gravity tasks and ADLs  work tasks, fitness  to minimize straining pelvic floor / back         Baseline: not IND, improper form that places strain on pelvic floor                Goal status: INITIAL   4.  Pt Bethany demo >  5 pt change on Lumbar FOTO to improve QOL and functional mobility  and to be able to walk > 1 hr with 50% less pain  Baseline:  53 pts   , walking > 1 hr causes 9/10 pain level   Goal status: INITIAL  5.  Pt Bethany report no neck/ shoulder pain with with vacuuming, mopping.               Baseline:  pain Goal status: INITIAL  6.  Pt Bethany demo levelled pelvic girdle and shoulder height in order to progress to deep core strengthening HEP and restore mobility at spine, pelvis, gait, posture   Baseline:  L iliac / R shoulder higher  Goal status: INITIAL   Jerl Mina, PT 09/03/2021, 2:39 PM

## 2021-09-03 NOTE — Patient Instructions (Signed)
Avoid straining pelvic floor, abdominal muscles , spine  Use log rolling technique instead of getting out of bed with your neck or the sit-up     Log rolling into and out of bed   Log rolling into and out of bed If getting out of bed on R side, Bent knees, scoot hips/ shoulder to L  Raise R arm completely overhead, rolling onto armpit  Then lower bent knees to bed to get into complete side lying position  Then drop legs off bed, and push up onto R elbow/forearm, and use L hand to push onto the bed  __   Proper body mechanics with getting out of a chair to decrease strain  on back &pelvic floor   Avoid holding your breath when Getting out of the chair:  Scoot to front part of chair chair Heels behind knees, feet are hip width apart, nose over toes  Inhale like you are smelling roses Exhale to stand   __  Sitting with feet on ground, four points of contact Catch yourself crossing ankles and thighs  __  

## 2021-09-09 ENCOUNTER — Other Ambulatory Visit (HOSPITAL_COMMUNITY): Payer: Self-pay

## 2021-09-10 ENCOUNTER — Ambulatory Visit: Payer: HMO | Admitting: Physical Therapy

## 2021-09-10 DIAGNOSIS — R278 Other lack of coordination: Secondary | ICD-10-CM

## 2021-09-10 DIAGNOSIS — M533 Sacrococcygeal disorders, not elsewhere classified: Secondary | ICD-10-CM

## 2021-09-10 DIAGNOSIS — R293 Abnormal posture: Secondary | ICD-10-CM | POA: Diagnosis not present

## 2021-09-10 DIAGNOSIS — R2689 Other abnormalities of gait and mobility: Secondary | ICD-10-CM

## 2021-09-10 NOTE — Patient Instructions (Addendum)
Sleep on only one pillow instead of two.   Sleep with pillow between knees when lying on your side   _  At work wear the headset instead of using the phone on shoulder  __ 2 x day  Lengthen Back rib by R  shoulder   ( winging)    Lie on L  side , pillow between knees and under head  Pull  R arm overhead over mattress, grab the edge of mattress,pull it upward, drawing elbow away from ears  Breathing 10 reps  Brushing arm with 3/4 turn onto pillow behind back  Lying on L  side ,Pillow/ Block between knees  10 reps    dragging top forearm across ribs below breast rotating 3/4 turn,  rotating  _R_ only this week  and then back to other palm , maintain top palm on body whole top and not lift shoulder

## 2021-09-10 NOTE — Therapy (Signed)
OUTPATIENT PHYSICAL THERAPY Treatment    Patient Name: Bethany Scott MRN: [id number] DOB:04-20-1955, 66 y.o., female Today's Date: 09/10/2021   PT End of Session - 09/10/21 1336     Visit Number 2    Number of Visits 10    Date for PT Re-Evaluation 11/12/21    PT Start Time 2426    PT Stop Time 1413    PT Time Calculation (min) 38 min             Past Medical History:  Diagnosis Date   Allergy    Anemia    Anxiety    Arthritis    OA Knee; non-specific autoimmune process followed by [facility]/Rheumatology.   Asthma    Blood transfusion without reported diagnosis YRS AGO   COVID-19 03/2018   Depression    Gastric ulcer, unspecified as acute or chronic, without mention of hemorrhage, perforation, or obstruction YRS AGO   GERD (gastroesophageal reflux disease)    Hyperlipidemia    Hypertension    Methemoglobinemia    Migraine    hx of migraines   Obesity, unspecified    Other chronic cystitis    Personal history of colonic polyps    Pneumonia    PONV (postoperative nausea and vomiting)    NAUSEA, SCOPOLAMINE PATCH HELPED WITH LAST SURGERIES   Pre-diabetes    Past Surgical History:  Procedure Laterality Date   ABDOMINAL HYSTERECTOMY  01/25/1994   uterine fibroids; DUB; cervical dysplasia; ovaries resected two years later.     APPENDECTOMY     BILATERAL OOPHORECTOMY  2001   CESAREAN SECTION     x 2   COLON SURGERY  YRS AGO   SIGMOIDECTOMY   Colonoscopy  09/08/2012   diverticulosis, hemorrhoids.  [name].  Symptoms:  anemia, hemoccult +.   ESOPHAGOGASTRODUODENOSCOPY  09/08/2012   +HH.  [name].  Symptoms: anemia, hemoccult +.   HIP ARTHROSCOPY Right    LAPAROSCOPIC GASTRIC SLEEVE RESECTION N/A 03/01/2016   Procedure: LAPAROSCOPIC GASTRIC SLEEVE RESECTION, UPPER ENDO;  Surgeon: [name], MD;  Location: [facility];  Service: General;  Laterality: N/A;   LAPAROSCOPIC LYSIS OF ADHESIONS N/A 02/10/2016   Procedure: LAPAROSCOPIC LYSIS OF ADHESIONS;  Surgeon: [name], MD;  [facility]: [facility];  Service: General;  Laterality: N/A;   LAPAROSCOPY N/A 02/10/2016   Procedure: LAPAROSCOPY DIAGNOSTIC;  Surgeon: [name], MD;  [facility]: [facility];  Service: General;  Laterality: N/A;   NASAL SEPTUM SURGERY     NECK SURGERY     Rheumatology Consult  11/26/2011   multiple arthralgias. Autoimmune labs negative.  Rx for Plaquenil for non-specific autoimmune process.  [name].   SHOULDER ARTHROSCOPY WITH OPEN ROTATOR CUFF REPAIR AND DISTAL CLAVICLE ACROMINECTOMY Right 01/13/2021   Procedure: SHOULDER ARTHROSCOPY WITH OPEN ROTATOR CUFF REPAIR AND DISTAL CLAVICLE ACROMINECTOMY;  Surgeon: Thornton Park, MD;  Location: [facility];  Service: Orthopedics;  Laterality: Right;   SHOULDER ARTHROSCOPY WITH SUBACROMIAL DECOMPRESSION Right 01/13/2021   Procedure: SHOULDER ARTHROSCOPY WITH SUBACROMIAL DECOMPRESSION, BICEPS TENOTOMY;  Surgeon: Thornton Park, MD;  Location: [facility];  Service: Orthopedics;  Laterality: Right;   SIGMOID RESECTION / RECTOPEXY     SPINE SURGERY     TONSILLECTOMY AND ADENOIDECTOMY     TOTAL HIP ARTHROPLASTY Right 09/27/2017   Procedure: TOTAL HIP ARTHROPLASTY ANTERIOR APPROACH;  Surgeon: [name], MD;  Location: [facility];  Service: Orthopedics;  Laterality: Right;   TOTAL HIP ARTHROPLASTY Left 12/27/2017   Procedure: TOTAL HIP ARTHROPLASTY ANTERIOR APPROACH;  Surgeon:  [name], MD;  Location: [facility];  Service: Orthopedics;  Laterality: Left;   Patient Active Problem List   Diagnosis Date Noted   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   Frequent episodes of sinusitis 05/19/2021   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[place]) 03/27/2021   Community acquired pneumonia of left lung    Acute respiratory failure with hypoxia ([name]) 09/18/2020   Sepsis ([name]) 09/17/2020   Status post total hip replacement, 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   Status post total hip replacement, right 09/27/2017   Incomplete emptying of bladder 02/16/2017   Chronic cystitis 02/08/2017    Hematuria, microscopic 02/08/2017   History of nephrolithiasis 02/08/2017   Pelvic pain in female 02/08/2017   Chronic venous insufficiency 01/06/2017   Varicose veins of leg with pain, right 12/31/2016   GERD (gastroesophageal reflux disease) 03/01/2016   History of gastric bypass 03/01/2016   Hip arthritis 05/29/2015   Degenerative joint disease (DJD) of hip 07/24/2014   DJD (degenerative joint disease) of knee 06/17/2014   Greater trochanteric bursitis of both hips 06/17/2014   Degenerative joint disease of sacroiliac joint ([place]) 06/17/2014   Facet syndrome, lumbar 06/17/2014   DDD (degenerative disc disease), lumbar 06/17/2014   Low serum vitamin D 06/08/2013   Sinusitis, acute, maxillary 02/08/2012   Migraines 02/08/2012   Depression with anxiety 02/08/2012   Essential hypertension, benign 02/08/2012   Pure hypercholesterolemia 02/08/2012   Diabetes mellitus type 2 in obese ([place]) 02/08/2012    PCP: [name] MD   REFERRING PROVIDER: [name] MD   REFERRING DIAG: Urge Incontinence  Rationale for Evaluation and Treatment Rehabilitation  THERAPY DIAG:  Abnormal posture  Sacrococcygeal disorders, not elsewhere classified  Other abnormalities of gait and mobility  Other lack of coordination  ONSET DATE:  6 years +  SUBJECTIVE:                                                                                                                                                                                           SUBJECTIVE STATEMENT: 1) Straining and difficulty emptying her bladder/ SUI :   Pt practiced getting out of bed the right way.   2) CLBP: It has been really good this week in terms of her stamina.                3) neck and shoulder pain:  pain is still when she turns her head to the R 6/10. Both hands goes numb when laying in bed and it goes away when she gets up. Pt sleeps on two pillows    PERTINENT HISTORY:  oopherectomy, hysterectomy,  gastric bypass, neck surgery, colon resection to remove  polyp, sedentary work job , Hx of fall onto tailbone, B THA,  PAIN:  Are you having pain? Yes: 7/10 LBP , neck and shoulders    PRECAUTIONS: None  WEIGHT BEARING RESTRICTIONS No  FALLS:  Has patient fallen in last 6 months? 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 medial/ posterior scapula , hypomobile thoracic junction R,    SI assessment  L iliac crest / R shoulder higher      Ambulation/Gait   Gait Comments reciporcal gait pattern              [facility] Adult PT Treatment/Exercise - 09/10/21 1340       Therapeutic Activites    Other Therapeutic Activities discussed ADL and work modifications to minimzie misalignment of shoulder and pelvis      Neuro Re-ed    Neuro Re-ed Details  cued for new stretches to promote diaphragmatic excursion      Manual Therapy   Manual therapy comments STM/MWM at thoracic regiona on R to promote lowered shoulder/  and pelvic alignment              HOME EXERCISE PROGRAM: See pt instruction section    ASSESSMENT:  CLINICAL IMPRESSION:                Following Tx today which pt tolerated without complaints, pt demo'd levelled pelvic and shoulder alignment and improved gait pattern. Discussed ADL and work modifications to minimize misalignment of shoulder and pelvis.   Plan to assess abdominal scar restrictions at next session  and progress with deep core strengthening once pelvic and shoulder alignment is restored.   Pt benefit from skilled PT.    OBJECTIVE IMPAIRMENTS decreased activity tolerance, decreased coordination, decreased endurance, decreased mobility, difficulty walking, decreased ROM, decreased strength, decreased safety awareness, hypomobility, increased muscle spasms, impaired flexibility, improper body mechanics, postural dysfunction, and pain and scar restrictions   ACTIVITY LIMITATIONS   community, work, interpersonal   PARTICIPATION LIMITATIONS:  Neck and shoulder pain with vacuuming, mopping.  CLBP impacts getting up from a deep chair, walking > 1 hour    PERSONAL FACTORS  affecting patient's functional outcome: oopherectomy, hysterectomy, gastric bypass, neck surgery, colon resection to remove polyp, sedentary work job,  Hx of fall onto tailbone    REHAB POTENTIAL: Good   CLINICAL DECISION MAKING: Evolving/moderate complexity   EVALUATION COMPLEXITY: Moderate    PATIENT EDUCATION:    Education details: Showed pt anatomy images. Explained muscles attachments/ connection, physiology of deep core system/ spinal- thoracic-pelvis-lower kinetic chain as they relate to pt's presentation, Sx, and past Hx. Explained what and how these areas of deficits need to be restored to balance and function    See Therapeutic activity / neuromuscular re-education section   Person educated: Patient Education method: Explanation, Demonstration, Tactile cues, Verbal cues, and Handouts Education comprehension: verbalized understanding,  returned demonstration, verbal cues required, tactile cues required, and needs further education     PLAN: PT FREQUENCY: 1x/week   PT DURATION: 10 weeks   PLANNED INTERVENTIONS: Therapeutic exercises, Therapeutic activity, Neuromuscular re-education, Balance training, Gait training, Patient/Family education, Self Care, Joint mobilization, Spinal mobilization, Moist heat,  Taping, and Manual therapy.   PLAN FOR NEXT SESSION: See clinical impression for plan     GOALS: Goals reviewed with patient? Yes  SHORT TERM GOALS: Target date: 10/01/2021    Pt will demo IND with HEP                    Baseline: Not IND            Goal status: INITIAL   LONG TERM GOALS: Target date: 11/12/2021     Pt will demo proper deep core coordination without chest breathing and optimal excursion of diaphragm/pelvic floor in order to promote spinal stability and pelvic floor function  Baseline: dyscoordination Goal status: INITIAL  2.  Pt will demo > 5 pt change on FOTO  to improve QOL and function  Urinary Problem baseline -58 pts  PFDI Urinary  baseline -38 pts    Goal status: INITIAL  3.  Pt will demo proper body mechanics in against gravity tasks and ADLs  work tasks, fitness  to minimize straining pelvic floor / back         Baseline: not IND, improper form that places strain on pelvic floor                Goal status: INITIAL   4.  Pt will demo > 5 pt change on Lumbar FOTO to improve QOL and functional mobility  and to be able to walk > 1 hr with 50% less pain  Baseline:  53 pts   , walking > 1 hr causes 9/10 pain level   Goal status: INITIAL  5.  Pt will report no neck/ shoulder pain with with vacuuming, mopping.               Baseline:  pain Goal status: INITIAL  6.  Pt will demo levelled pelvic girdle and shoulder height in order to progress to deep core strengthening HEP and restore mobility at spine, pelvis, gait, posture   Baseline:  L iliac / R shoulder higher  Goal status: INITIAL   Jerl Mina, PT 09/10/2021, 2:16 PM

## 2021-09-14 ENCOUNTER — Ambulatory Visit: Payer: Self-pay | Admitting: Urology

## 2021-09-14 ENCOUNTER — Other Ambulatory Visit: Payer: Self-pay

## 2021-09-16 ENCOUNTER — Other Ambulatory Visit: Payer: Self-pay

## 2021-09-17 ENCOUNTER — Ambulatory Visit: Payer: HMO | Admitting: Physical Therapy

## 2021-09-17 ENCOUNTER — Other Ambulatory Visit: Payer: Self-pay

## 2021-09-17 DIAGNOSIS — R278 Other lack of coordination: Secondary | ICD-10-CM

## 2021-09-17 DIAGNOSIS — R2689 Other abnormalities of gait and mobility: Secondary | ICD-10-CM

## 2021-09-17 DIAGNOSIS — M533 Sacrococcygeal disorders, not elsewhere classified: Secondary | ICD-10-CM

## 2021-09-17 DIAGNOSIS — R293 Abnormal posture: Secondary | ICD-10-CM

## 2021-09-17 MED ORDER — PROMETHAZINE HCL 25 MG PO TABS
ORAL_TABLET | ORAL | 0 refills | Status: DC
  Filled 2021-09-17: qty 20, 7d supply, fill #0

## 2021-09-17 MED ORDER — BUSPIRONE HCL 10 MG PO TABS
10.0000 mg | ORAL_TABLET | Freq: Three times a day (TID) | ORAL | 2 refills | Status: DC
  Filled 2021-09-17: qty 270, 90d supply, fill #0
  Filled 2021-11-26: qty 270, 90d supply, fill #1
  Filled 2022-06-10: qty 270, 90d supply, fill #2

## 2021-09-17 NOTE — Therapy (Signed)
OUTPATIENT PHYSICAL THERAPY Treatment    Patient Name: Bethany Scott MRN: [id number] DOB:10/09/1955, 66 y.o., female Today's Date: 09/17/2021   PT End of Session - 09/17/21 1132     Visit Number 3    Number of Visits 10    Date for PT Re-Evaluation 11/12/21    PT Start Time 1115    PT Stop Time 1200    PT Time Calculation (min) 45 min    Activity Tolerance Patient tolerated treatment well    Behavior During Therapy [facility] for tasks assessed/performed             Past Medical History:  Diagnosis Date   Allergy    Anemia    Anxiety    Arthritis    OA Knee; non-specific autoimmune process followed by [facility]/Rheumatology.   Asthma    Blood transfusion without reported diagnosis YRS AGO   COVID-19 03/2018   Depression    Gastric ulcer, unspecified as acute or chronic, without mention of hemorrhage, perforation, or obstruction YRS AGO   GERD (gastroesophageal reflux disease)    Hyperlipidemia    Hypertension    Methemoglobinemia    Migraine    hx of migraines   Obesity, unspecified    Other chronic cystitis    Personal history of colonic polyps    Pneumonia    PONV (postoperative nausea and vomiting)    NAUSEA, SCOPOLAMINE PATCH HELPED WITH LAST SURGERIES   Pre-diabetes    Past Surgical History:  Procedure Laterality Date   ABDOMINAL HYSTERECTOMY  01/25/1994   uterine fibroids; DUB; cervical dysplasia; ovaries resected two years later.     APPENDECTOMY     BILATERAL OOPHORECTOMY  2001   CESAREAN SECTION     x 2   COLON SURGERY  YRS AGO   SIGMOIDECTOMY   Colonoscopy  09/08/2012   diverticulosis, hemorrhoids.  [name].  Symptoms:  anemia, hemoccult +.   ESOPHAGOGASTRODUODENOSCOPY  09/08/2012   +HH.  [name].  Symptoms: anemia, hemoccult +.   HIP ARTHROSCOPY Right    LAPAROSCOPIC GASTRIC SLEEVE RESECTION N/A 03/01/2016   Procedure: LAPAROSCOPIC GASTRIC SLEEVE RESECTION, UPPER ENDO;  Surgeon: [name], MD;  [facility];  Service: General;  Laterality:  N/A;   LAPAROSCOPIC LYSIS OF ADHESIONS N/A 02/10/2016   Procedure: LAPAROSCOPIC LYSIS OF ADHESIONS;  Surgeon: [name], MD;  [facility];  Service: General;  Laterality: N/A;   LAPAROSCOPY N/A 02/10/2016   Procedure: LAPAROSCOPY DIAGNOSTIC;  Surgeon: [name], MD;  [facility];  Service: General;  Laterality: N/A;   NASAL SEPTUM SURGERY     NECK SURGERY     Rheumatology Consult  11/26/2011   multiple arthralgias. Autoimmune labs negative.  Rx for Plaquenil for non-specific autoimmune process.  [name].   SHOULDER ARTHROSCOPY WITH OPEN ROTATOR CUFF REPAIR AND DISTAL CLAVICLE ACROMINECTOMY Right 01/13/2021   Procedure: SHOULDER ARTHROSCOPY WITH OPEN ROTATOR CUFF REPAIR AND DISTAL CLAVICLE ACROMINECTOMY;  Surgeon: Thornton Park, MD;  Location: [facility];  Service: Orthopedics;  Laterality: Right;   SHOULDER ARTHROSCOPY WITH SUBACROMIAL DECOMPRESSION Right 01/13/2021   Procedure: SHOULDER ARTHROSCOPY WITH SUBACROMIAL DECOMPRESSION, BICEPS TENOTOMY;  Surgeon: Thornton Park, MD;  Location: [facility];  Service: Orthopedics;  Laterality: Right;   SIGMOID RESECTION / RECTOPEXY     SPINE SURGERY     TONSILLECTOMY AND ADENOIDECTOMY     TOTAL HIP ARTHROPLASTY Right 09/27/2017   Procedure: TOTAL HIP ARTHROPLASTY ANTERIOR APPROACH;  Surgeon: [name], MD;  Location: [facility];  Service: Orthopedics;  Laterality: Right;   TOTAL HIP ARTHROPLASTY Left 12/27/2017   Procedure: TOTAL HIP ARTHROPLASTY ANTERIOR APPROACH;  Surgeon: [name], MD;  Location: [facility];  Service: Orthopedics;  Laterality: Left;   Patient Active Problem List   Diagnosis Date Noted   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   Frequent episodes of sinusitis 05/19/2021   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[place]) 03/27/2021   Community acquired pneumonia of left lung    Acute respiratory failure with hypoxia ([place]) 09/18/2020   Sepsis ([place]) 09/17/2020   Status post total hip replacement, 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   Status post total hip  replacement, right 09/27/2017   Incomplete emptying of bladder 02/16/2017   Chronic cystitis 02/08/2017   Hematuria, microscopic 02/08/2017   History of nephrolithiasis 02/08/2017   Pelvic pain in female 02/08/2017   Chronic venous insufficiency 01/06/2017   Varicose veins of leg with pain, right 12/31/2016   GERD (gastroesophageal reflux disease) 03/01/2016   History of gastric bypass 03/01/2016   Hip arthritis 05/29/2015   Degenerative joint disease (DJD) of hip 07/24/2014   DJD (degenerative joint disease) of knee 06/17/2014   Greater trochanteric bursitis of both hips 06/17/2014   Degenerative joint disease of sacroiliac joint ([place]) 06/17/2014   Facet syndrome, lumbar 06/17/2014   DDD (degenerative disc disease), lumbar 06/17/2014   Low serum vitamin D 06/08/2013   Sinusitis, acute, maxillary 02/08/2012   Migraines 02/08/2012   Depression with anxiety 02/08/2012   Essential hypertension, benign 02/08/2012   Pure hypercholesterolemia 02/08/2012   Diabetes mellitus type 2 in obese ([place]) 02/08/2012    PCP: [name] MD   REFERRING PROVIDER: [name] MD   REFERRING DIAG: Urge Incontinence  Rationale for Evaluation and Treatment Rehabilitation  THERAPY DIAG:  Abnormal posture  Sacrococcygeal disorders, not elsewhere classified  Other abnormalities of gait and mobility  Other lack of coordination  ONSET DATE:  6 years +  SUBJECTIVE:                                                                                                                                                                                           SUBJECTIVE STATEMENT: 1) Straining and difficulty emptying her bladder/ SUI :  No change    2) CLBP: LBP has been better too this past week to 2-3/10.                3) neck and shoulder pain:   Pt notices decreased pain from 6-7/10 to 2-3 /10 this past week. Pt felt sore after last session but it felt better.  Pt started using the headset  instead holding phone on shoulder.     PERTINENT  HISTORY:  oopherectomy, hysterectomy, gastric bypass, neck surgery, colon resection to remove polyp, sedentary work job , Hx of fall onto tailbone, B THA,  PAIN:  Are you having pain? Yes: 7/10 LBP , neck and shoulders    PRECAUTIONS: None  WEIGHT BEARING RESTRICTIONS No  FALLS:  Has patient fallen in last 6 months? 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  PATIENT GOALS   Improve incontinence, get stronger down there    OBJECTIVE:    [facility] PT Assessment - 09/17/21 1451       Coordination   Coordination and Movement Description chest breathing with deep core exercises      Palpation   Palpation comment R shoulder slightly higher, tightness at medial scap, intercostals, T7/10 on R             [facility] Adult PT Treatment/Exercise - 09/17/21 1450       Neuro Re-ed    Neuro Re-ed Details  cued for deep core coordination ( level 1-2) cued for less chest breathing      Manual Therapy   Manual therapy comments STM/MWM at medial scap, intercostals, T7/10 on R             HOME EXERCISE PROGRAM: See pt instruction section    ASSESSMENT:  CLINICAL IMPRESSION:              Pt notices decreased pain from 6-7/10 to 2-3 /10 this past week. Pt felt sore after last session but it felt better.  Pt started using the headset instead holding phone on shoulderFollowing Tx today which pt tolerated without complaints, Pt demo'd lowered R shoulder and decreased mm tightness at medial scapula/ intercostal/ T7, T10 areas. Pt demo's improved diaphragmatic excursion and was progressed to deep core HEP which will help with pelvic floor dysfunction/ complete emptying of urine and SUI. Pt as guided through relaxation which will help prevent  upper trap overuse and was explained the role of nn system on pelvic floor function.   Plan to assess abdominal scar restrictions at next session and review deep core HEP.   Pt benefits from skilled PT.    OBJECTIVE IMPAIRMENTS decreased activity tolerance, decreased coordination, decreased endurance, decreased mobility, difficulty walking, decreased ROM, decreased strength, decreased safety awareness, hypomobility, increased muscle spasms, impaired flexibility, improper body mechanics, postural dysfunction, and pain and scar restrictions   ACTIVITY LIMITATIONS   community, work, interpersonal   PARTICIPATION LIMITATIONS:  Neck and shoulder pain with vacuuming, mopping.  CLBP impacts getting up from a deep chair, walking > 1 hour    PERSONAL FACTORS  affecting patient's functional outcome: oopherectomy, hysterectomy, gastric bypass, neck surgery, colon resection to remove polyp, sedentary work job,  Hx of fall onto tailbone    REHAB POTENTIAL: Good   CLINICAL DECISION MAKING: Evolving/moderate complexity   EVALUATION COMPLEXITY: Moderate    PATIENT EDUCATION:    Education details: Showed pt anatomy images. Explained muscles attachments/ connection, physiology of deep core system/ spinal- thoracic-pelvis-lower kinetic chain as they relate to pt's presentation, Sx, and past Hx. Explained what and how these areas of deficits need to be restored to balance and function    See Therapeutic activity / neuromuscular re-education section   Person educated: Patient Education method: Explanation, Demonstration, Tactile cues, Verbal cues, and Handouts Education  comprehension: verbalized understanding, returned demonstration, verbal cues required, tactile cues required, and needs further education     PLAN: PT FREQUENCY: 1x/week   PT DURATION: 10 weeks   PLANNED INTERVENTIONS: Therapeutic exercises, Therapeutic activity, Neuromuscular re-education, Balance training, Gait training,  Patient/Family education, Self Care, Joint mobilization, Spinal mobilization, Moist heat, Taping, and Manual therapy.   PLAN FOR NEXT SESSION: See clinical impression for plan     GOALS: Goals reviewed with patient? Yes  SHORT TERM GOALS: Target date: 10/01/2021    Pt will demo IND with HEP                    Baseline: Not IND            Goal status: INITIAL   LONG TERM GOALS: Target date: 11/12/2021     Pt will demo proper deep core coordination without chest breathing and optimal excursion of diaphragm/pelvic floor in order to promote spinal stability and pelvic floor function  Baseline: dyscoordination Goal status: INITIAL  2.  Pt will demo > 5 pt change on FOTO  to improve QOL and function  Urinary Problem baseline -58 pts  PFDI Urinary  baseline -38 pts    Goal status: INITIAL  3.  Pt will demo proper body mechanics in against gravity tasks and ADLs  work tasks, fitness  to minimize straining pelvic floor / back         Baseline: not IND, improper form that places strain on pelvic floor                Goal status: INITIAL   4.  Pt will demo > 5 pt change on Lumbar FOTO to improve QOL and functional mobility  and to be able to walk > 1 hr with 50% less pain  Baseline:  53 pts   , walking > 1 hr causes 9/10 pain level   Goal status: INITIAL  5.  Pt will report no neck/ shoulder pain with with vacuuming, mopping.               Baseline:  pain Goal status: INITIAL  6.  Pt will demo levelled pelvic girdle and shoulder height in order to progress to deep core strengthening HEP and restore mobility at spine, pelvis, gait, posture   Baseline:  L iliac / R shoulder higher  Goal status: INITIAL   Jerl Mina, PT 09/17/2021, 11:34 AM

## 2021-09-18 ENCOUNTER — Other Ambulatory Visit: Payer: Self-pay

## 2021-09-21 ENCOUNTER — Ambulatory Visit: Payer: PPO | Admitting: Urology

## 2021-09-21 ENCOUNTER — Encounter: Payer: Self-pay | Admitting: Urology

## 2021-09-21 VITALS — BP 143/80 | HR 91 | Ht 67.0 in | Wt 211.0 lb

## 2021-09-21 DIAGNOSIS — N3946 Mixed incontinence: Secondary | ICD-10-CM | POA: Diagnosis not present

## 2021-09-21 DIAGNOSIS — N3020 Other chronic cystitis without hematuria: Secondary | ICD-10-CM | POA: Diagnosis not present

## 2021-09-21 LAB — MICROSCOPIC EXAMINATION

## 2021-09-21 LAB — URINALYSIS, COMPLETE
Bilirubin, UA: NEGATIVE
Glucose, UA: NEGATIVE
Ketones, UA: NEGATIVE
Leukocytes,UA: NEGATIVE
Nitrite, UA: POSITIVE — AB
Protein,UA: NEGATIVE
Specific Gravity, UA: 1.015 (ref 1.005–1.030)
Urobilinogen, Ur: 0.2 mg/dL (ref 0.2–1.0)
pH, UA: 5 (ref 5.0–7.5)

## 2021-09-21 MED ORDER — MIRABEGRON ER 50 MG PO TB24: 50.0000 mg | ORAL_TABLET | Freq: Every day | ORAL | 11 refills | Status: DC

## 2021-09-21 NOTE — Progress Notes (Signed)
09/21/2021 9:27 AM   [place] [birth year] [id number]  Referring provider: [name], MD [place],  [place] [id number]  Chief Complaint  Patient presents with   New Patient (Initial Visit)   Cystitis    HPI: I was consulted to assist the patient's incontinence.  She sometimes leaks with coughing sneezing.  She can have urge incontinence of her bladder is too full.  She changes her underwear designed for incontinence once or twice a day.  She voids every 1 hour to 90 minutes and gets up once or twice a night.  Her flow was good but she has to concentrate.  She stops and starts but feels empty  She says she gets more than 12 bladder infections a year.  She has burning and urgency.  She had Pyridium at home and was taking Macrobid after intercourse.  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  She has had neck surgery and hysterectomy.  She is currently doing physical therapy for incontinence which helping  She cannot take cephalosporins and penicillin due to anaphylaxis.  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  She thinks she can take sulfa but they do not want her to take Macrodantin for unknown reason   PMH: Past Medical History:  Diagnosis Date   Allergy    Anemia    Anxiety    Arthritis    OA Knee; non-specific autoimmune process followed by [facility]/Rheumatology.   Asthma    Blood transfusion without reported diagnosis YRS AGO   COVID-19 03/2018   Depression    Gastric ulcer, unspecified as acute or chronic, without mention of hemorrhage, perforation, or obstruction YRS AGO   GERD (gastroesophageal reflux disease)    Hyperlipidemia    Hypertension    Methemoglobinemia    Migraine    hx of  migraines   Obesity, unspecified    Other chronic cystitis    Personal history of colonic polyps    Pneumonia    PONV (postoperative nausea and vomiting)    NAUSEA, SCOPOLAMINE PATCH HELPED WITH LAST SURGERIES   Pre-diabetes     Surgical History: Past Surgical History:  Procedure Laterality Date   ABDOMINAL HYSTERECTOMY  01/25/1994   uterine fibroids; DUB; cervical dysplasia; ovaries resected two years later.     APPENDECTOMY     BILATERAL OOPHORECTOMY  2001   CESAREAN SECTION     x 2   COLON SURGERY  YRS AGO   SIGMOIDECTOMY   Colonoscopy  09/08/2012   diverticulosis, hemorrhoids.  [place].  Symptoms:  anemia, hemoccult +.   ESOPHAGOGASTRODUODENOSCOPY  09/08/2012   +HH.  [place] Scott.  Symptoms: anemia, hemoccult +.   HIP ARTHROSCOPY Right    LAPAROSCOPIC GASTRIC SLEEVE RESECTION N/A 03/01/2016   Procedure: LAPAROSCOPIC GASTRIC SLEEVE RESECTION, UPPER ENDO;  Surgeon: [name], MD;  Location: [facility];  Service: General;  Laterality: N/A;   LAPAROSCOPIC LYSIS OF ADHESIONS N/A 02/10/2016   Procedure: LAPAROSCOPIC LYSIS OF ADHESIONS;  Surgeon: [name], MD;  Location: [facility];  Service: General;  Laterality: N/A;   LAPAROSCOPY N/A 02/10/2016   Procedure: LAPAROSCOPY DIAGNOSTIC;  Surgeon: [name], MD;  Location: [facility];  Service: General;  Laterality: N/A;   NASAL SEPTUM SURGERY  NECK SURGERY     Rheumatology Consult  11/26/2011   multiple arthralgias. Autoimmune labs negative.  Rx for Plaquenil for non-specific autoimmune process.  [place].   SHOULDER ARTHROSCOPY WITH OPEN ROTATOR CUFF REPAIR AND DISTAL CLAVICLE ACROMINECTOMY Right 01/13/2021   Procedure: SHOULDER ARTHROSCOPY WITH OPEN ROTATOR CUFF REPAIR AND DISTAL CLAVICLE ACROMINECTOMY;  Surgeon: Thornton Park, MD;  Location: [facility];  Service: Orthopedics;  Laterality: Right;   SHOULDER ARTHROSCOPY WITH SUBACROMIAL DECOMPRESSION Right 01/13/2021   Procedure: SHOULDER ARTHROSCOPY WITH SUBACROMIAL DECOMPRESSION, BICEPS  TENOTOMY;  Surgeon: Thornton Park, MD;  Location: [facility];  Service: Orthopedics;  Laterality: Right;   SIGMOID RESECTION / RECTOPEXY     SPINE SURGERY     TONSILLECTOMY AND ADENOIDECTOMY     TOTAL HIP ARTHROPLASTY Right 09/27/2017   Procedure: TOTAL HIP ARTHROPLASTY ANTERIOR APPROACH;  Surgeon: [place] Knows, MD;  Location: [facility];  Service: Orthopedics;  Laterality: Right;   TOTAL HIP ARTHROPLASTY Left 12/27/2017   Procedure: TOTAL HIP ARTHROPLASTY ANTERIOR APPROACH;  Surgeon: [place] Knows, MD;  Location: [facility];  Service: Orthopedics;  Laterality: Left;    Home Medications:  Allergies as of 09/21/2021       Reactions   Cephalexin Anaphylaxis   Cephalosporins Anaphylaxis   Macrobid [nitrofurantoin Macrocrystal] Other (See Comments)   Causes methemoglobinemia   Penicillins Anaphylaxis, Other (See Comments)   Has patient had a PCN reaction causing immediate rash, facial/tongue/throat swelling, SOB or lightheadedness with hypotension: Yes Has patient had a PCN reaction causing severe rash involving mucus membranes or skin necrosis: No Has patient had a PCN reaction that required hospitalization Yes Has patient had a PCN reaction occurring within the last 10 years: No If all of the above answers are "NO", then may proceed with Cephalosporin use.   Phenazopyridine    Other reaction(s): Other (See Comments) Methemoglobinemia   Levaquin [levofloxacin] Other (See Comments)   Hemolytic anemia due to Levaquin        Medication List        Accurate as of September 21, 2021  9:27 AM. If you have any questions, ask your nurse or doctor.          STOP taking these medications    estradiol 0.1 MG/GM vaginal cream Commonly known as: ESTRACE Stopped by: Elayne Snare Eunice Winecoff, MD   ipratropium 0.03 % nasal spray Commonly known as: ATROVENT Stopped by: Reece Packer, MD   Ozempic (0.25 or 0.5 MG/DOSE) 2 MG/3ML Sopn Generic drug: Semaglutide(0.25 or 0.'5MG'$ /DOS) Stopped by:  Reece Packer, MD   phenazopyridine 200 MG tablet Commonly known as: PYRIDIUM Stopped by: Reece Packer, MD       TAKE these medications    albuterol 108 (90 Base) MCG/ACT inhaler Commonly known as: VENTOLIN HFA Inhale 1-2 puffs into the lungs every 6 (six) hours as needed for wheezing or shortness of breath.   ALPRAZolam 0.25 MG tablet Commonly known as: XANAX Take 1 tablet (0.25 mg total) by mouth 2 (two) times daily as needed for anxiety.   Azelastine-Fluticasone 137-50 MCG/ACT Susp Place 2 sprays into the nose 2 (two) times a day.   busPIRone 10 MG tablet Commonly known as: BUSPAR Take 1 tablet (10 mg total) by mouth 3 (three) times daily   DULoxetine 60 MG capsule Commonly known as: CYMBALTA Take 1 capsule (60 mg total) by mouth 2 (two) times daily   gabapentin 300 MG capsule Commonly known as: NEURONTIN Take 1-3 capsules (300-900 mg total) by mouth 3 (three) times  daily   HYDROcodone-acetaminophen 10-325 MG tablet Commonly known as: NORCO Take 1 tablet by mouth every 4 (four) hours as needed for pain. (Take 1 tablet by mouth every 4 (four) hours as needed for Pain) What changed: Another medication with the same name was removed. Continue taking this medication, and follow the directions you see here. Changed by: Reece Packer, MD   HYDROcodone-acetaminophen 10-325 MG tablet Commonly known as: NORCO Take 1 tablet by mouth every 4 (four) hours as needed for Pain (DNF 07/24/21) What changed: Another medication with the same name was removed. Continue taking this medication, and follow the directions you see here. Changed by: Reece Packer, MD   HYDROcodone-acetaminophen 10-325 MG tablet Commonly known as: NORCO Take 1 tablet by mouth every 4 (four) hours as needed for Pain What changed: Another medication with the same name was removed. Continue taking this medication, and follow the directions you see here. Changed by: Reece Packer, MD    Ibuprofen-Acetaminophen 125-250 MG Tabs Take 1 tablet by mouth as needed for pain.   ketoconazole 2 % shampoo Commonly known as: NIZORAL apply three times per week, massage into scalp and leave in for 10 minutes before rinsing out   ketoconazole 2 % cream Commonly known as: NIZORAL Apply topically 2 (two) times daily.   latanoprost 0.005 % ophthalmic solution Commonly known as: XALATAN Place 1 drop into both eyes at bedtime.   levocetirizine 5 MG tablet Commonly known as: XYZAL Take 1 tablet (5 mg total) by mouth every evening What changed: Another medication with the same name was removed. Continue taking this medication, and follow the directions you see here. Changed by: Reece Packer, MD   metFORMIN 500 MG tablet Commonly known as: GLUCOPHAGE Take 1 tablet (500 mg total) by mouth 2 (two) times daily with meals   methocarbamol 750 MG tablet Commonly known as: ROBAXIN Take 750 mg by mouth 3 (three) times daily as needed for muscle spasms.   montelukast 10 MG tablet Commonly known as: SINGULAIR Take 1 tablet (10 mg total) by mouth at bedtime What changed: Another medication with the same name was removed. Continue taking this medication, and follow the directions you see here. Changed by: Reece Packer, MD   omeprazole 40 MG capsule Commonly known as: PRILOSEC Take 40 mg by mouth 2 (two) times daily.   promethazine 25 MG tablet Commonly known as: PHENERGAN Take 1 tablet (25 mg total) by mouth every 8 (eight) hours as needed for Nausea   Symbicort 160-4.5 MCG/ACT inhaler Generic drug: budesonide-formoterol INHALE 2 PUFFS BY MOUTH 2 TIMES DAILY   Wegovy 0.25 MG/0.5ML Soaj Generic drug: Semaglutide-Weight Management Inject 0.25 mg subcutaneously once a week (Inject 0.5 mLs (0.25 mg total) subcutaneously once a week)        Allergies:  Allergies  Allergen Reactions   Cephalexin Anaphylaxis   Cephalosporins Anaphylaxis   Macrobid [Nitrofurantoin  Macrocrystal] Other (See Comments)    Causes methemoglobinemia   Penicillins Anaphylaxis and Other (See Comments)    Has patient had a PCN reaction causing immediate rash, facial/tongue/throat swelling, SOB or lightheadedness with hypotension: Yes Has patient had a PCN reaction causing severe rash involving mucus membranes or skin necrosis: No Has patient had a PCN reaction that required hospitalization Yes Has patient had a PCN reaction occurring within the last 10 years: No If all of the above answers are "NO", then may proceed with Cephalosporin use.    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    Family History: Family History  Problem Relation Age of Onset   Bipolar disorder Father    Transient ischemic attack Father    Dementia Father        secondary to head trauma   Hypertension Father    Diabetes Father    CAD Father    Cancer Mother 54       lung   Migraines Mother    Hypothyroidism Mother    Hypertension Sister    Atrial fibrillation Sister    Hypothyroidism Sister    Alcohol abuse Brother    Hypertension Brother    Alcohol abuse Brother    Atrial fibrillation Maternal Grandmother    COPD Maternal Grandmother    Diabetes Maternal Grandmother    Hyperlipidemia Maternal Grandmother    Diabetes Maternal Grandfather    Diabetes Paternal Grandfather    Heart disease Paternal Grandfather    Breast cancer Sister 37    Social History:  reports that she has quit smoking. Her smoking use included cigarettes. She has a 5.00 pack-year smoking history. She has been exposed to tobacco smoke. She has never used smokeless tobacco. She reports current alcohol use. She reports that she does not use drugs.  ROS:                                        Physical Exam: BP (!) 143/80   Pulse 91   Ht '5\' 7"'$  (1.702 m)   Wt 95.7 kg   BMI  33.05 kg/m   Constitutional:  Alert and oriented, No acute distress. HEENT: [place] AT, moist mucus membranes.  Trachea midline, no masses. Cardiovascular: No clubbing, cyanosis, or edema. Respiratory: Normal respiratory effort, no increased work of breathing. GI: Abdomen is soft, nontender, nondistended, no abdominal masses GU: Mild hypermobility of bladder neck and negative cough test with good cough and no prolapse Skin: No rashes, bruises or suspicious lesions. Lymph: No cervical or inguinal adenopathy. Neurologic: Grossly intact, no focal deficits, moving all 4 extremities. Psychiatric: Normal mood and affect.  Laboratory Data: Lab Results  Component Value Date   WBC 6.7 07/17/2021   HGB 11.6 (L) 07/17/2021   HCT 36.5 07/17/2021   MCV 98.4 07/17/2021   PLT 370 07/17/2021    Lab Results  Component Value Date   CREATININE 0.46 07/10/2021    No results found for: "PSA"  No results found for: "TESTOSTERONE"  Lab Results  Component Value Date   HGBA1C 4.7 (L) 04/30/2017    Urinalysis    Component Value Date/Time   COLORURINE AMBER (A) 07/09/2021 2300   APPEARANCEUR CLEAR (A) 07/09/2021 2300   LABSPEC 1.005 07/09/2021 2300   PHURINE 7.0 07/09/2021 2300   GLUCOSEU NEGATIVE 07/09/2021 2300   HGBUR NEGATIVE 07/09/2021 2300   BILIRUBINUR NEGATIVE 07/09/2021 2300   BILIRUBINUR positive 04/05/2018 1633   KETONESUR NEGATIVE 07/09/2021 2300   PROTEINUR NEGATIVE 07/09/2021 2300   UROBILINOGEN >=8.0 (A) 04/05/2018 1633   NITRITE POSITIVE (A) 07/09/2021 2300   LEUKOCYTESUR NEGATIVE 07/09/2021 2300    Pertinent Imaging: Urine reviewed.  Urine sent for culture.  Chart reviewed.  Last urine culture in 2022 on chart was negative  Assessment & Plan: Patient has mild mixed incontinence.  I am not convinced she is having as many  urinary tract infections that she says she is.  She has a lot of issue with medication and Amoxil put her on prophylaxis.  I am not can order urodynamics I  thought was reasonable to start her on Myrbetriq 50 mg samples and prescription and continue with physical therapy and call if culture positive.  I ordered a renal ultrasound and call if abnormal. Assess in 6 or 7 weeks.  1. Chronic cystitis  - Urinalysis, Complete   No follow-ups on file.  Reece Packer, MD  [facility] [address] [place], [place] [address] [phone]

## 2021-09-22 ENCOUNTER — Inpatient Hospital Stay: Payer: HMO | Attending: Oncology

## 2021-09-22 ENCOUNTER — Encounter: Payer: Self-pay | Admitting: Oncology

## 2021-09-22 ENCOUNTER — Other Ambulatory Visit: Payer: Self-pay

## 2021-09-22 ENCOUNTER — Inpatient Hospital Stay (HOSPITAL_BASED_OUTPATIENT_CLINIC_OR_DEPARTMENT_OTHER): Payer: HMO | Admitting: Oncology

## 2021-09-22 VITALS — BP 122/55 | HR 73 | Temp 96.8°F | Resp 18 | Wt 214.9 lb

## 2021-09-22 DIAGNOSIS — D749 Methemoglobinemia, unspecified: Secondary | ICD-10-CM

## 2021-09-22 DIAGNOSIS — D592 Drug-induced nonautoimmune hemolytic anemia: Secondary | ICD-10-CM | POA: Insufficient documentation

## 2021-09-22 LAB — CBC WITH DIFFERENTIAL/PLATELET
Abs Immature Granulocytes: 0.01 10*3/uL (ref 0.00–0.07)
Basophils Absolute: 0.1 10*3/uL (ref 0.0–0.1)
Basophils Relative: 1 %
Eosinophils Absolute: 0.5 10*3/uL (ref 0.0–0.5)
Eosinophils Relative: 9 %
HCT: 39.1 % (ref 36.0–46.0)
Hemoglobin: 12.3 g/dL (ref 12.0–15.0)
Immature Granulocytes: 0 %
Lymphocytes Relative: 39 %
Lymphs Abs: 2.3 10*3/uL (ref 0.7–4.0)
MCH: 29.6 pg (ref 26.0–34.0)
MCHC: 31.5 g/dL (ref 30.0–36.0)
MCV: 94 fL (ref 80.0–100.0)
Monocytes Absolute: 0.5 10*3/uL (ref 0.1–1.0)
Monocytes Relative: 9 %
Neutro Abs: 2.5 10*3/uL (ref 1.7–7.7)
Neutrophils Relative %: 42 %
Platelets: 240 10*3/uL (ref 150–400)
RBC: 4.16 MIL/uL (ref 3.87–5.11)
RDW: 12.2 % (ref 11.5–15.5)
WBC: 5.8 10*3/uL (ref 4.0–10.5)
nRBC: 0 % (ref 0.0–0.2)

## 2021-09-22 LAB — RETICULOCYTES
Immature Retic Fract: 14.5 % (ref 2.3–15.9)
RBC.: 4.21 MIL/uL (ref 3.87–5.11)
Retic Count, Absolute: 42.1 10*3/uL (ref 19.0–186.0)
Retic Ct Pct: 1 % (ref 0.4–3.1)

## 2021-09-22 LAB — COMPREHENSIVE METABOLIC PANEL
ALT: 12 U/L (ref 0–44)
AST: 15 U/L (ref 15–41)
Albumin: 3.9 g/dL (ref 3.5–5.0)
Alkaline Phosphatase: 57 U/L (ref 38–126)
Anion gap: 4 — ABNORMAL LOW (ref 5–15)
BUN: 12 mg/dL (ref 8–23)
CO2: 28 mmol/L (ref 22–32)
Calcium: 8.9 mg/dL (ref 8.9–10.3)
Chloride: 105 mmol/L (ref 98–111)
Creatinine, Ser: 0.55 mg/dL (ref 0.44–1.00)
GFR, Estimated: >60 mL/min (ref >60)
Glucose, Bld: 128 mg/dL — ABNORMAL HIGH (ref 70–99)
Potassium: 3.6 mmol/L (ref 3.5–5.1)
Sodium: 137 mmol/L (ref 135–145)
Total Bilirubin: <0.1 mg/dL — ABNORMAL LOW (ref 0.3–1.2)
Total Protein: 6.9 g/dL (ref 6.5–8.1)

## 2021-09-22 LAB — VITAMIN B12: Vitamin B-12: 448 pg/mL (ref 180–914)

## 2021-09-22 MED ORDER — LIDOCAINE HCL 3 % EX CREA
TOPICAL_CREAM | CUTANEOUS | 0 refills | Status: DC
  Filled 2021-09-22: qty 28.3, 7d supply, fill #0

## 2021-09-22 NOTE — Progress Notes (Unsigned)
Pt will like to discuss mammogram results since family has breast cancer hx.

## 2021-09-23 LAB — CULTURE, URINE COMPREHENSIVE

## 2021-09-24 ENCOUNTER — Ambulatory Visit: Payer: HMO | Admitting: Physical Therapy

## 2021-09-24 LAB — HAPTOGLOBIN: Haptoglobin: 153 mg/dL (ref 37–355)

## 2021-09-24 NOTE — Progress Notes (Signed)
Hematology/Oncology Consult note [facility]  [phone] [phone]  Patient Care Team: [name], MD as PCP - General (Family Medicine) Ammie Dalton, Okey Regal, MD (Obstetrics and Gynecology)   Name of the patient: Bethany Scott  [id number]  03-May-1955   Date of visit: 09/24/21  Diagnosis- 1.  History of drug-induced hemolytic anemia 2.  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  She also has a history of iron deficiency and folate acid deficiency.  Patient was admitted for bilateral pneumonia in August 2022 at that time she was treated with antibiotics.  At that time she was evaluated for a hemoglobin of 6.6 and underwent a comprehensive work-up which showed normal ferritin of 183 with a normal iron saturation folate level normal at 18.8 B12 levels normal at 494 immunoglobulins were normal haptoglobin was low initially at less than 10 and subsequently in May 2023 it was still low at 16 LDH has been normal.  She has had G6PD levels checked 3 different occasions over the last 1 month and they have been within normal limits.  Coombs test in April 2023 was negative.  PNH profile testing negative.  Her hemoglobin which was 6.6 in August 2022 subsequently recovered to 10.5-11 over the next 6 months.  Hemoglobinopathy evaluation negative. 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 She did not require steroids or other treatment for her hemolytic anemia.    Patient presented to the ER with symptoms of shortness of breath and her lips turning purple in June 2023.  She was given Azo and Macrobid recently for UTI.Patient had a coexemetry panel performed in the hospital which showed an elevated  methemoglobin level of 24.7%.  Carboxyhemoglobin level less than 0.3 and oxygen saturation on that sample was 95.4% patient was given methylene blue with improvement of her cyanosis.  No exposure to well water.  No other recent changes in medications.  Patient then had a second episode of methemoglobinemia that was less severe a few weeks later and patient presented to [facility].  Symptoms resolved with conservative management and did not require methylene blue.  Azo was thought to be the culprit drug    Interval history-patient is presently doing well and and since her hospitalization in June 2023 she has not had any further episodes of methemoglobinemia.She recently had a mammogram which showed an area of possible concern which appeared more like a fibroadenoma and was deemed to be a BI-RADS Category 3.  Patient would like to proceed with a biopsy of this area to put her mind at ease.  ECOG PS- 1 Pain scale- 0   Review of systems- Review of Systems  Constitutional:  Negative for chills, fever, malaise/fatigue and weight loss.  HENT:  Negative for congestion, ear discharge and nosebleeds.   Eyes:  Negative for blurred vision.  Respiratory:  Negative for cough, hemoptysis, sputum production, shortness of breath and wheezing.   Cardiovascular:  Negative for chest pain, palpitations, orthopnea and claudication.  Gastrointestinal:  Negative for abdominal pain, blood in stool, constipation, diarrhea, heartburn, melena, nausea and vomiting.  Genitourinary:  Negative for dysuria, flank pain, frequency, hematuria and urgency.  Musculoskeletal:  Negative for back pain, joint pain and myalgias.  Skin:  Negative for rash.  Neurological:  Negative for dizziness, tingling, focal  weakness, seizures, weakness and headaches.  Endo/Heme/Allergies:  Does not bruise/bleed easily.  Psychiatric/Behavioral:  Negative for depression and suicidal ideas. The patient does not have insomnia.       Allergies  Allergen  Reactions   Cephalexin Anaphylaxis   Cephalosporins Anaphylaxis   Macrobid [Nitrofurantoin Macrocrystal] Other (See Comments)    Causes methemoglobinemia   Penicillins Anaphylaxis and Other (See Comments)    Has patient had a PCN reaction causing immediate rash, facial/tongue/throat swelling, SOB or lightheadedness with hypotension: Yes Has patient had a PCN reaction causing severe rash involving mucus membranes or skin necrosis: No Has patient had a PCN reaction that required hospitalization Yes Has patient had a PCN reaction occurring within the last 10 years: No If all of the above answers are "NO", then may proceed with Cephalosporin use.    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     Past Medical History:  Diagnosis Date   Allergy    Anemia    Anxiety    Arthritis    OA Knee; non-specific autoimmune process followed by [facility]/Rheumatology.   Asthma    Blood transfusion without reported diagnosis YRS AGO   COVID-19 03/2018   Depression    Gastric ulcer, unspecified as acute or chronic, without mention of hemorrhage, perforation, or obstruction YRS AGO   GERD (gastroesophageal reflux disease)    Hyperlipidemia    Hypertension    Methemoglobinemia    Migraine    hx of migraines   Obesity, unspecified    Other chronic cystitis    Personal history of colonic polyps    Pneumonia    PONV (postoperative nausea and vomiting)    NAUSEA, SCOPOLAMINE PATCH HELPED WITH LAST SURGERIES   Pre-diabetes      Past Surgical History:  Procedure Laterality Date   ABDOMINAL HYSTERECTOMY  01/25/1994   uterine fibroids; DUB; cervical dysplasia; ovaries resected two years later.     APPENDECTOMY     BILATERAL OOPHORECTOMY  2001   CESAREAN SECTION     x 2   COLON SURGERY  YRS AGO   SIGMOIDECTOMY   Colonoscopy  09/08/2012   diverticulosis, hemorrhoids.   [name].  Symptoms:  anemia, hemoccult +.   ESOPHAGOGASTRODUODENOSCOPY  09/08/2012   +HH.  [name].  Symptoms: anemia, hemoccult +.   HIP ARTHROSCOPY Right    LAPAROSCOPIC GASTRIC SLEEVE RESECTION N/A 03/01/2016   Procedure: LAPAROSCOPIC GASTRIC SLEEVE RESECTION, UPPER ENDO;  Surgeon: [name], MD;  Location: [facility];  Service: General;  Laterality: N/A;   LAPAROSCOPIC LYSIS OF ADHESIONS N/A 02/10/2016   Procedure: LAPAROSCOPIC LYSIS OF ADHESIONS;  Surgeon: [name], MD;  Location: [facility];  Service: General;  Laterality: N/A;   LAPAROSCOPY N/A 02/10/2016   Procedure: LAPAROSCOPY DIAGNOSTIC;  Surgeon: [name], MD;  Location: [facility];  Service: General;  Laterality: N/A;   NASAL SEPTUM SURGERY     NECK SURGERY     Rheumatology Consult  11/26/2011   multiple arthralgias. Autoimmune labs negative.  Rx for Plaquenil for non-specific autoimmune process.  [name].   SHOULDER ARTHROSCOPY WITH OPEN ROTATOR CUFF REPAIR AND DISTAL CLAVICLE ACROMINECTOMY Right 01/13/2021   Procedure: SHOULDER ARTHROSCOPY WITH OPEN ROTATOR CUFF REPAIR AND DISTAL CLAVICLE ACROMINECTOMY;  Surgeon: Thornton Park, MD;  Location: [facility];  Service: Orthopedics;  Laterality: Right;   SHOULDER ARTHROSCOPY WITH SUBACROMIAL DECOMPRESSION Right 01/13/2021   Procedure: SHOULDER ARTHROSCOPY  WITH SUBACROMIAL DECOMPRESSION, BICEPS TENOTOMY;  Surgeon: Thornton Park, MD;  Location: [facility];  Service: Orthopedics;  Laterality: Right;   SIGMOID RESECTION / RECTOPEXY     SPINE SURGERY     TONSILLECTOMY AND ADENOIDECTOMY     TOTAL HIP ARTHROPLASTY Right 09/27/2017   Procedure: TOTAL HIP ARTHROPLASTY ANTERIOR APPROACH;  Surgeon: [name], MD;  Location: [facility];  Service: Orthopedics;  Laterality: Right;   TOTAL HIP ARTHROPLASTY Left 12/27/2017   Procedure: TOTAL HIP ARTHROPLASTY ANTERIOR APPROACH;  Surgeon: [name], MD;  Location: [facility];  Service: Orthopedics;  Laterality: Left;    Social History    Socioeconomic History   Marital status: Married    Spouse name: Not on file   Number of children: 2   Years of education: 12   Highest education level: Not on file  Occupational History   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      Children: 2 sons (24, 64); 2 grandchildren  (66yo Atlast, 69 yo August)      Lives: with husband, [name]/son.      Employment: check in at [employer] in [place] x 2004; loves job.      Tobacco abuse:  Former smoker.      Alcohol:  None; holidays only      Exercise: none in 2017     Seatbelt: 100%; no texting   Social Determinants of Health   Financial Resource Strain: Not on file  Food Insecurity: Not on file  Transportation Needs: Not on file  Physical Activity: Not on file  Stress: Not on file  Social Connections: Not on file  Intimate Partner Violence: Not on file    Family History  Problem Relation Age of Onset   Bipolar disorder Father    Transient ischemic attack Father    Dementia Father        secondary to head trauma   Hypertension Father    Diabetes Father    CAD Father    Cancer Mother 84       lung   Migraines Mother    Hypothyroidism Mother    Hypertension Sister    Atrial fibrillation Sister    Hypothyroidism Sister    Alcohol abuse Brother    Hypertension Brother    Alcohol abuse Brother     Atrial fibrillation Maternal Grandmother    COPD Maternal Grandmother    Diabetes Maternal Grandmother    Hyperlipidemia Maternal Grandmother    Diabetes Maternal Grandfather    Diabetes Paternal Grandfather    Heart disease Paternal Grandfather    Breast cancer Sister 14     Current Outpatient Medications:    albuterol (VENTOLIN HFA) 108 (90 Base) MCG/ACT inhaler, Inhale 1-2 puffs into the  lungs every 6 (six) hours as needed for wheezing or shortness of breath., Disp: 1 each, Rfl: 0   ALPRAZolam (XANAX) 0.25 MG tablet, Take 1 tablet (0.25 mg total) by mouth 2 (two) times daily as needed for anxiety., Disp: 20 tablet, Rfl: 0   Azelastine-Fluticasone 137-50 MCG/ACT SUSP, Place 2 sprays into the nose 2 (two) times a day., Disp: 23 g, Rfl: 0   budesonide-formoterol (SYMBICORT) 160-4.5 MCG/ACT inhaler, INHALE 2 PUFFS BY MOUTH 2 TIMES DAILY, Disp: 10.2 g, Rfl: 2   DULoxetine (CYMBALTA) 60 MG capsule, Take 1 capsule (60 mg total) by mouth 2 (two) times daily, Disp: 180 capsule, Rfl: 3   gabapentin (NEURONTIN) 300 MG capsule, Take 1-3 capsules (300-900 mg total) by mouth 3 (three) times daily, Disp: 810 capsule, Rfl: 3   HYDROcodone-acetaminophen (NORCO) 10-325 MG tablet, Take 1 tablet by mouth every 4 (four) hours as needed for Pain, Disp: 150 tablet, Rfl: 0   latanoprost (XALATAN) 0.005 % ophthalmic solution, Place 1 drop into both eyes at bedtime., Disp: , Rfl:    levocetirizine (XYZAL) 5 MG tablet, Take 1 tablet (5 mg total) by mouth every evening, Disp: 90 tablet, Rfl: 4   metFORMIN (GLUCOPHAGE) 500 MG tablet, Take 1 tablet (500 mg total) by mouth 2 (two) times daily with meals, Disp: 60 tablet, Rfl: 11   methocarbamol (ROBAXIN) 750 MG tablet, Take 750 mg by mouth 3 (three) times daily as needed for muscle spasms., Disp: , Rfl:    mirabegron ER (MYRBETRIQ) 50 MG TB24 tablet, Take 1 tablet (50 mg total) by mouth daily., Disp: 30 tablet, Rfl: 11   montelukast (SINGULAIR) 10 MG tablet, Take 1  tablet (10 mg total) by mouth at bedtime, Disp: 90 tablet, Rfl: 4   omeprazole (PRILOSEC) 40 MG capsule, Take 40 mg by mouth 2 (two) times daily., Disp: , Rfl:    promethazine (PHENERGAN) 25 MG tablet, Take 1 tablet (25 mg total) by mouth every 8 (eight) hours as needed for Nausea, Disp: 20 tablet, Rfl: 0   busPIRone (BUSPAR) 10 MG tablet, Take 1 tablet (10 mg total) by mouth 3 (three) times daily (Patient not taking: Reported on 09/22/2021), Disp: 270 tablet, Rfl: 2   HYDROcodone-acetaminophen (NORCO) 10-325 MG tablet, Take 1 tablet by mouth every 4 (four) hours as needed for Pain (DNF 07/24/21), Disp: 150 tablet, Rfl: 0   HYDROcodone-acetaminophen (NORCO) 10-325 MG tablet, Take 1 tablet by mouth every 4 (four) hours as needed for Pain, Disp: 150 tablet, Rfl: 0   Ibuprofen-Acetaminophen 125-250 MG TABS, Take 1 tablet by mouth as needed for pain., Disp: , Rfl:    ketoconazole (NIZORAL) 2 % cream, Apply topically 2 (two) times daily., Disp: , Rfl:    ketoconazole (NIZORAL) 2 % shampoo, apply three times per week, massage into scalp and leave in for 10 minutes before rinsing out, Disp: 120 mL, Rfl: 11   lidocaine (LINDAMANTLE) 3 % CREA cream, Apply topically., Disp: 28.3 g, Rfl: 0   Semaglutide-Weight Management (WEGOVY) 0.25 MG/0.5ML SOAJ, Inject 0.5 mLs (0.25 mg total) subcutaneously once a week (Patient not taking: Reported on 09/22/2021), Disp: 2 mL, Rfl: 0  Physical exam:  Vitals:   09/22/21 1014  BP: (!) 122/55  Pulse: 73  Resp: 18  Temp: (!) 96.8 F (36 C)  SpO2: 100%  Weight: 214 lb 14.4 oz (97.5 kg)   Physical Exam Constitutional:      General: She is not in acute distress. Cardiovascular:     Rate and  Rhythm: Normal rate and regular rhythm.     Heart sounds: Normal heart sounds.  Pulmonary:     Effort: Pulmonary effort is normal.     Breath sounds: Normal breath sounds.  Skin:    General: Skin is warm and dry.  Neurological:     Mental Status: She is alert and oriented to  person, place, and time.         Latest Ref Rng & Units 09/22/2021    9:56 AM  CMP  Glucose 70 - 99 mg/dL 128   BUN 8 - 23 mg/dL 12   Creatinine 0.44 - 1.00 mg/dL 0.55   Sodium 135 - 145 mmol/L 137   Potassium 3.5 - 5.1 mmol/L 3.6   Chloride 98 - 111 mmol/L 105   CO2 22 - 32 mmol/L 28   Calcium 8.9 - 10.3 mg/dL 8.9   Total Protein 6.5 - 8.1 g/dL 6.9   Total Bilirubin 0.3 - 1.2 mg/dL <0.1   Alkaline Phos 38 - 126 U/L 57   AST 15 - 41 U/L 15   ALT 0 - 44 U/L 12       Latest Ref Rng & Units 09/22/2021    9:56 AM  CBC  WBC 4.0 - 10.5 K/uL 5.8   Hemoglobin 12.0 - 15.0 g/dL 12.3   Hematocrit 36.0 - 46.0 % 39.1   Platelets 150 - 400 K/uL 240     No images are attached to the encounter.  MM DIAG BREAST TOMO UNI LEFT  Result Date: 09/02/2021 CLINICAL DATA:  Callback from screening mammogram for possible mass left breast. Family history of breast cancer. EXAM: DIGITAL DIAGNOSTIC UNILATERAL LEFT MAMMOGRAM WITH TOMOSYNTHESIS; ULTRASOUND LEFT BREAST LIMITED TECHNIQUE: Left digital diagnostic mammography and breast tomosynthesis was performed.; Targeted ultrasound examination of the left breast was performed. COMPARISON:  Previous exam(s). ACR Breast Density Category b: There are scattered areas of fibroglandular density. FINDINGS: Spot compression cc and MLO views of the left breast, lateral view of the left breast are submitted. Previously questioned mass in the medial upper left breast is persistent. This mass is probably unchanged compared to prior mammogram of November 2012. Targeted ultrasound is performed, showing 1.2 x 0.4 x 0.8 cm oval hypoechoic mass at the left breast 10 o'clock 2 cm from nipple correlating to the mammographic mass. This may be a fibroadenoma. IMPRESSION: Probable benign findings. RECOMMENDATION: I discussed the options of short-term follow-up, needle biopsy and surgical excision with the patient. The patient prefers needle biopsy to establish diagnosis. I have  discussed the findings and recommendations with the patient. If applicable, a reminder letter will be sent to the patient regarding the next appointment. BI-RADS CATEGORY  3: Probably benign. Electronically Signed   By: Abelardo Diesel M.D.   On: 09/02/2021 10:55  US BREAST LTD UNI LEFT INC AXILLA  Result Date: 09/02/2021 CLINICAL DATA:  Callback from screening mammogram for possible mass left breast. Family history of breast cancer. EXAM: DIGITAL DIAGNOSTIC UNILATERAL LEFT MAMMOGRAM WITH TOMOSYNTHESIS; ULTRASOUND LEFT BREAST LIMITED TECHNIQUE: Left digital diagnostic mammography and breast tomosynthesis was performed.; Targeted ultrasound examination of the left breast was performed. COMPARISON:  Previous exam(s). ACR Breast Density Category b: There are scattered areas of fibroglandular density. FINDINGS: Spot compression cc and MLO views of the left breast, lateral view of the left breast are submitted. Previously questioned mass in the medial upper left breast is persistent. This mass is probably unchanged compared to prior mammogram of November 2012. Targeted ultrasound is performed,  showing 1.2 x 0.4 x 0.8 cm oval hypoechoic mass at the left breast 10 o'clock 2 cm from nipple correlating to the mammographic mass. This may be a fibroadenoma. IMPRESSION: Probable benign findings. RECOMMENDATION: I discussed the options of short-term follow-up, needle biopsy and surgical excision with the patient. The patient prefers needle biopsy to establish diagnosis. I have discussed the findings and recommendations with the patient. If applicable, a reminder letter will be sent to the patient regarding the next appointment. BI-RADS CATEGORY  3: Probably benign. Electronically Signed   By: Abelardo Diesel M.D.   On: 09/02/2021 10:55    Assessment and plan- Patient is a 66 y.o. 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  No further episodes  since June 2023.  Patient is scheduled to undergo a breast biopsy for a BI-RADS Category 3 lesion which will require local lidocaine.  Patient has received lidocaine in the past which has not caused any episodes of methemoglobinemia.  She does feel jittery with lidocaine which she states is self-limited.  I therefore feel the patient can proceed with her breast biopsy with lidocaine at this time.  Methemoglobinemia is typically associated with specific drugs and although lidocaine can be associated with it, she has tolerated it well in the past and therefore can receive at this time.  I have encouraged her to reschedule her appointment with [facility] benign hematology for second opinion for methemoglobinemia which she had previously canceled  Prior history of drug-induced autoimmune hemolytic anemia: Presently her hemoglobin is normal and hemolytic parameters are all within normal limits.  Continue to monitor  Repeat CBC with differential CMP reticulocyte panel haptoglobin in 3 months and I will see her thereafter   Visit Diagnosis 1. Methemoglobinemia      Dr. Randa Evens, MD, MPH [facility] [phone] 09/24/2021 9:04 AM

## 2021-09-28 ENCOUNTER — Telehealth: Payer: PPO | Admitting: Physician Assistant

## 2021-09-28 DIAGNOSIS — R3989 Other symptoms and signs involving the genitourinary system: Secondary | ICD-10-CM

## 2021-09-28 MED ORDER — SULFAMETHOXAZOLE-TRIMETHOPRIM 800-160 MG PO TABS: 1.0000 | ORAL_TABLET | Freq: Two times a day (BID) | ORAL | 0 refills | Status: DC

## 2021-09-28 NOTE — Progress Notes (Signed)

## 2021-09-29 ENCOUNTER — Other Ambulatory Visit: Payer: Self-pay | Admitting: Family Medicine

## 2021-10-01 ENCOUNTER — Ambulatory Visit: Payer: PPO | Attending: Family Medicine | Admitting: Physical Therapy

## 2021-10-01 DIAGNOSIS — R2689 Other abnormalities of gait and mobility: Secondary | ICD-10-CM | POA: Insufficient documentation

## 2021-10-01 DIAGNOSIS — R293 Abnormal posture: Secondary | ICD-10-CM | POA: Insufficient documentation

## 2021-10-01 DIAGNOSIS — R278 Other lack of coordination: Secondary | ICD-10-CM | POA: Insufficient documentation

## 2021-10-01 DIAGNOSIS — M542 Cervicalgia: Secondary | ICD-10-CM | POA: Diagnosis present

## 2021-10-01 DIAGNOSIS — M533 Sacrococcygeal disorders, not elsewhere classified: Secondary | ICD-10-CM | POA: Diagnosis present

## 2021-10-01 NOTE — Therapy (Signed)
OUTPATIENT PHYSICAL THERAPY Treatment    Patient Name: Bethany Scott MRN: [id number] DOB:[birth year], 66 y.o., female Today's Date: 10/01/2021   PT End of Session - 10/01/21 1337     Visit Number 4    Number of Visits 10    Date for PT Re-Evaluation 11/12/21    PT Start Time 1330    PT Stop Time 1415    PT Time Calculation (min) 45 min    Activity Tolerance Patient tolerated treatment well    Behavior During Therapy [facility] for tasks assessed/performed             Past Medical History:  Diagnosis Date   Allergy    Anemia    Anxiety    Arthritis    OA Knee; non-specific autoimmune process followed by [facility]/Rheumatology.   Asthma    Blood transfusion without reported diagnosis YRS AGO   COVID-19 03/2018   Depression    Gastric ulcer, unspecified as acute or chronic, without mention of hemorrhage, perforation, or obstruction YRS AGO   GERD (gastroesophageal reflux disease)    Hyperlipidemia    Hypertension    Methemoglobinemia    Migraine    hx of migraines   Obesity, unspecified    Other chronic cystitis    Personal history of colonic polyps    Pneumonia    PONV (postoperative nausea and vomiting)    NAUSEA, SCOPOLAMINE PATCH HELPED WITH LAST SURGERIES   Pre-diabetes    Past Surgical History:  Procedure Laterality Date   ABDOMINAL HYSTERECTOMY  01/25/1994   uterine fibroids; DUB; cervical dysplasia; ovaries resected two years later.     APPENDECTOMY     BILATERAL OOPHORECTOMY  2001   CESAREAN SECTION     x 2   COLON SURGERY  YRS AGO   SIGMOIDECTOMY   Colonoscopy  09/08/2012   diverticulosis, hemorrhoids.  [name].  Symptoms:  anemia, hemoccult +.   ESOPHAGOGASTRODUODENOSCOPY  09/08/2012   +HH.  [name].  Symptoms: anemia, hemoccult +.   HIP ARTHROSCOPY Right    LAPAROSCOPIC GASTRIC SLEEVE RESECTION N/A 03/01/2016   Procedure: LAPAROSCOPIC GASTRIC SLEEVE RESECTION, UPPER ENDO;  Surgeon: [name], MD;  Location: [facility];  Service: General;  Laterality:  N/A;   LAPAROSCOPIC LYSIS OF ADHESIONS N/A 02/10/2016   Procedure: LAPAROSCOPIC LYSIS OF ADHESIONS;  Surgeon: [name], MD;  Location: [facility];  Service: General;  Laterality: N/A;   LAPAROSCOPY N/A 02/10/2016   Procedure: LAPAROSCOPY DIAGNOSTIC;  Surgeon: [name], MD;  Location: [facility];  Service: General;  Laterality: N/A;   NASAL SEPTUM SURGERY     NECK SURGERY     Rheumatology Consult  11/26/2011   multiple arthralgias. Autoimmune labs negative.  Rx for Plaquenil for non-specific autoimmune process.  [name].   SHOULDER ARTHROSCOPY WITH OPEN ROTATOR CUFF REPAIR AND DISTAL CLAVICLE ACROMINECTOMY Right 01/13/2021   Procedure: SHOULDER ARTHROSCOPY WITH OPEN ROTATOR CUFF REPAIR AND DISTAL CLAVICLE ACROMINECTOMY;  Surgeon: Thornton Park, MD;  Location: [facility];  Service: Orthopedics;  Laterality: Right;   SHOULDER ARTHROSCOPY WITH SUBACROMIAL DECOMPRESSION Right 01/13/2021   Procedure: SHOULDER ARTHROSCOPY WITH SUBACROMIAL DECOMPRESSION, BICEPS TENOTOMY;  Surgeon: Thornton Park, MD;  Location: [facility];  Service: Orthopedics;  Laterality: Right;   SIGMOID RESECTION / RECTOPEXY     SPINE SURGERY     TONSILLECTOMY AND ADENOIDECTOMY     TOTAL HIP ARTHROPLASTY Right 09/27/2017   Procedure: TOTAL HIP ARTHROPLASTY ANTERIOR APPROACH;  Surgeon: [name], MD;  Location: [facility];  Service: Orthopedics;  Laterality: Right;   TOTAL HIP ARTHROPLASTY Left 12/27/2017   Procedure: TOTAL HIP ARTHROPLASTY ANTERIOR APPROACH;  Surgeon: [name], MD;  Location: [facility];  Service: Orthopedics;  Laterality: Left;   Patient Active Problem List   Diagnosis Date Noted   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   Frequent episodes of sinusitis 05/19/2021   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[place]) 03/27/2021   Community acquired pneumonia of left lung    Acute respiratory failure with hypoxia ([place]) 09/18/2020   Sepsis ([place]) 09/17/2020   Status post total hip replacement, 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   Status post total hip  replacement, right 09/27/2017   Incomplete emptying of bladder 02/16/2017   Chronic cystitis 02/08/2017   Hematuria, microscopic 02/08/2017   History of nephrolithiasis 02/08/2017   Pelvic pain in female 02/08/2017   Chronic venous insufficiency 01/06/2017   Varicose veins of leg with pain, right 12/31/2016   GERD (gastroesophageal reflux disease) 03/01/2016   History of gastric bypass 03/01/2016   Hip arthritis 05/29/2015   Degenerative joint disease (DJD) of hip 07/24/2014   DJD (degenerative joint disease) of knee 06/17/2014   Greater trochanteric bursitis of both hips 06/17/2014   Degenerative joint disease of sacroiliac joint ([place]) 06/17/2014   Facet syndrome, lumbar 06/17/2014   DDD (degenerative disc disease), lumbar 06/17/2014   Low serum vitamin D 06/08/2013   Sinusitis, acute, maxillary 02/08/2012   Migraines 02/08/2012   Depression with anxiety 02/08/2012   Essential hypertension, benign 02/08/2012   Pure hypercholesterolemia 02/08/2012   Diabetes mellitus type 2 in obese ([place]) 02/08/2012    PCP: [name] MD   REFERRING PROVIDER: [name] MD   REFERRING DIAG: Urge Incontinence  Rationale for Evaluation and Treatment Rehabilitation  THERAPY DIAG:  Abnormal posture  Sacrococcygeal disorders, not elsewhere classified  Other abnormalities of gait and mobility  Other lack of coordination  Neck pain  ONSET DATE:  6 years +  SUBJECTIVE:                                                                                                                                                                                           SUBJECTIVE STATEMENT:                 Pt has been practicing her breathing technique and is managing her stress more. "I feel better as a person".   1) Straining and difficulty emptying her bladder/ SUI :  Pt has a UTI right now. She has started antibiotics.   Pt has seen an urologist. Pt is waiting on a [place] to rule out kidney  stones.   2) CLBP: LBP is 2-3/10.  3) neck and shoulder pain:   Pt notices neck pain L turn 3/10, R turn 0/10 .     PERTINENT HISTORY:  oopherectomy, hysterectomy, gastric bypass, neck surgery, colon resection to remove polyp, sedentary work job , Hx of fall onto tailbone, B THA,  PAIN:  Are you having pain? Yes: 7/10 LBP , neck and shoulders    PRECAUTIONS: None  WEIGHT BEARING RESTRICTIONS No  FALLS:  Has patient fallen in last 6 months? 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  PATIENT GOALS   Improve incontinence, get stronger down there    OBJECTIVE:   [facility] PT Assessment - 10/01/21 1453       Coordination   Coordination and Movement Description good carry over with deep core coordination , no more upper trap overuse      Sit to Stand   Comments narrow BOS             [facility] Adult PT Treatment/Exercise - 10/01/21 1454       Therapeutic Activites    Other Therapeutic Activities cued for wider kness and BOS in sit to stand , toileting posture, explaiend anatomy/ physiology of pelvic floor,      Neuro Re-ed    Neuro Re-ed Details  cued for deep core level 2 , pelvic tilt anterior for lengthening of pelvic floor to optimize emptying of bladder             HOME EXERCISE PROGRAM: See pt instruction section    ASSESSMENT:  CLINICAL IMPRESSION: Pt progressed to deep core level 2 with cues for technique. Anterior pelvic floor mm through external assessment ( clothing donned) showed limited lengthening. Internal pelvic floor assessment is withheld until UTI clears. Anatomy/ physiology was explained for optimal emptying of urine.  Cued for finding anterior tilt of pelvis with toileting posture and with deep core to optimize lengthening of pelvic floor.    Plan to assess abdominal scar restrictions at next session and review deep core HEP.   Pt benefits from skilled PT.    OBJECTIVE IMPAIRMENTS decreased activity tolerance, decreased coordination, decreased endurance, decreased mobility, difficulty walking, decreased ROM, decreased strength, decreased safety awareness, hypomobility, increased muscle spasms, impaired flexibility, improper body mechanics, postural dysfunction, and pain and scar restrictions   ACTIVITY LIMITATIONS   community, work, interpersonal   PARTICIPATION LIMITATIONS:  Neck and shoulder pain with vacuuming, mopping.  CLBP impacts getting up from a deep chair, walking > 1 hour    PERSONAL FACTORS  affecting patient's functional outcome: oopherectomy, hysterectomy, gastric bypass, neck surgery, colon resection to remove polyp, sedentary work job,  Hx of fall onto tailbone    REHAB POTENTIAL: Good   CLINICAL DECISION MAKING: Evolving/moderate complexity   EVALUATION COMPLEXITY: Moderate    PATIENT EDUCATION:    Education details: Showed pt anatomy images. Explained muscles attachments/ connection, physiology of deep core system/ spinal- thoracic-pelvis-lower kinetic chain as they relate to pt's presentation, Sx, and past Hx. Explained what and how these areas of deficits need to be restored to balance and function    See Therapeutic activity / neuromuscular re-education section   Person educated: Patient Education method: Explanation, Demonstration, Tactile cues, Verbal cues, and Handouts Education comprehension: verbalized understanding, returned demonstration, verbal cues required, tactile cues required, and needs further education  PLAN: PT FREQUENCY: 1x/week   PT DURATION: 10 weeks   PLANNED INTERVENTIONS: Therapeutic exercises, Therapeutic activity, Neuromuscular re-education, Balance training, Gait training, Patient/Family education, Self Care, Joint mobilization, Spinal mobilization, Moist heat,  Taping, and Manual therapy.   PLAN FOR NEXT SESSION: See clinical impression for plan     GOALS: Goals reviewed with patient? Yes  SHORT TERM GOALS: Target date: 10/01/2021    Pt will demo IND with HEP                    Baseline: Not IND            Goal status: INITIAL   LONG TERM GOALS: Target date: 11/12/2021     Pt will demo proper deep core coordination without chest breathing and optimal excursion of diaphragm/pelvic floor in order to promote spinal stability and pelvic floor function  Baseline: dyscoordination Goal status: INITIAL  2.  Pt will demo > 5 pt change on FOTO  to improve QOL and function  Urinary Problem baseline -58 pts  PFDI Urinary  baseline -38 pts    Goal status: INITIAL  3.  Pt will demo proper body mechanics in against gravity tasks and ADLs  work tasks, fitness  to minimize straining pelvic floor / back         Baseline: not IND, improper form that places strain on pelvic floor                Goal status: INITIAL   4.  Pt will demo > 5 pt change on Lumbar FOTO to improve QOL and functional mobility  and to be able to walk > 1 hr with 50% less pain  Baseline:  53 pts   , walking > 1 hr causes 9/10 pain level   Goal status: INITIAL  5.  Pt will report no neck/ shoulder pain with with vacuuming, mopping.               Baseline:  pain Goal status: INITIAL  6.  Pt will demo levelled pelvic girdle and shoulder height in order to progress to deep core strengthening HEP and restore mobility at spine, pelvis, gait, posture   Baseline:  L iliac / R shoulder higher  Goal status: INITIAL   Jerl Mina, PT 10/01/2021, 1:39 PM

## 2021-10-02 ENCOUNTER — Telehealth: Payer: PPO | Admitting: Family Medicine

## 2021-10-02 ENCOUNTER — Telehealth: Payer: Self-pay

## 2021-10-02 DIAGNOSIS — N3000 Acute cystitis without hematuria: Secondary | ICD-10-CM

## 2021-10-02 NOTE — Progress Notes (Signed)
Because Ms. Owens Shark, I feel your condition warrants further evaluation and I recommend that you be seen in a face to face visit. Urinary tract infections are normally resolved within 5 days. At this point you need a UA with culture to ensure you are taking the correct antibiotic. Thank you   NOTE: There will be NO CHARGE for this eVisit   If you are having a true medical emergency please call 911.      For an urgent face to face visit, [facility] has seven urgent care centers for your convenience:     [place] [facility] [place] [facility] [place] Get Driving Directions [phone] [place], [place] [address]    [place] [facility]) Get Driving Directions [phone] [place], [place] [address]  [place] [facility] [place] [facility]) Get Driving Directions [phone] [address] [place],  [place]  [address]  [place] [facility] [place] [facility] - at [address] Get Driving Directions  [phone] [address] [place],  [place] [address]   [place] [facility] Get Driving Directions [phone] [address], [place], [place] [address]   [place] [facility] Get Driving Directions  [phone] [address] [place], [place] [address]   [place] [facility] at  Get Driving Directions [phone] [address], [place], [place] [address]  Your MyChart E-visit questionnaire answers were reviewed by a board certified advanced clinical practitioner to complete your personal care plan based on your specific symptoms.  Thank you for using e-Visits.

## 2021-10-02 NOTE — Addendum Note (Signed)
Addended by: Dellia Nims on: 10/02/2021 09:27 AM   Modules accepted: Level of Service

## 2021-10-02 NOTE — Telephone Encounter (Signed)
Pt states that she has one more day of Bactrim DS and is still having urinary frequency, urgency, and lots of bladder pressure. She states that she has chronic cystitis, worse after intercourse. Scheduled pt a f/u appointment advised pt that she may improve over the weekend and if so ok to call and cancel f/u. Pt voiced understanding.

## 2021-10-05 ENCOUNTER — Other Ambulatory Visit: Payer: Self-pay | Admitting: Family Medicine

## 2021-10-05 ENCOUNTER — Ambulatory Visit: Payer: PPO | Admitting: Physician Assistant

## 2021-10-05 DIAGNOSIS — R928 Other abnormal and inconclusive findings on diagnostic imaging of breast: Secondary | ICD-10-CM

## 2021-10-05 DIAGNOSIS — N63 Unspecified lump in unspecified breast: Secondary | ICD-10-CM

## 2021-10-06 ENCOUNTER — Emergency Department
Admission: EM | Admit: 2021-10-06 | Discharge: 2021-10-06 | Disposition: A | Discharge status: Home / Self Care | Payer: PPO | Attending: Emergency Medicine | Admitting: Emergency Medicine

## 2021-10-06 ENCOUNTER — Other Ambulatory Visit: Payer: Self-pay

## 2021-10-06 ENCOUNTER — Encounter: Payer: Self-pay | Admitting: Physician Assistant

## 2021-10-06 ENCOUNTER — Emergency Department: Payer: PPO

## 2021-10-06 DIAGNOSIS — D749 Methemoglobinemia, unspecified: Secondary | ICD-10-CM | POA: Diagnosis not present

## 2021-10-06 DIAGNOSIS — R230 Cyanosis: Secondary | ICD-10-CM | POA: Diagnosis present

## 2021-10-06 DIAGNOSIS — Z20822 Contact with and (suspected) exposure to covid-19: Secondary | ICD-10-CM | POA: Diagnosis not present

## 2021-10-06 DIAGNOSIS — E119 Type 2 diabetes mellitus without complications: Secondary | ICD-10-CM | POA: Diagnosis not present

## 2021-10-06 DIAGNOSIS — I10 Essential (primary) hypertension: Secondary | ICD-10-CM | POA: Insufficient documentation

## 2021-10-06 LAB — COMPREHENSIVE METABOLIC PANEL
ALT: 12 U/L (ref 0–44)
AST: 18 U/L (ref 15–41)
Albumin: 4.4 g/dL (ref 3.5–5.0)
Alkaline Phosphatase: 67 U/L (ref 38–126)
Anion gap: 11 (ref 5–15)
BUN: 14 mg/dL (ref 8–23)
CO2: 24 mmol/L (ref 22–32)
Calcium: 9.4 mg/dL (ref 8.9–10.3)
Chloride: 100 mmol/L (ref 98–111)
Creatinine, Ser: 0.82 mg/dL (ref 0.44–1.00)
GFR, Estimated: >60 mL/min (ref >60)
Glucose, Bld: 113 mg/dL — ABNORMAL HIGH (ref 70–99)
Potassium: 4.2 mmol/L (ref 3.5–5.1)
Sodium: 135 mmol/L (ref 135–145)
Total Bilirubin: 0.7 mg/dL (ref 0.3–1.2)
Total Protein: 7.8 g/dL (ref 6.5–8.1)

## 2021-10-06 LAB — URINALYSIS, ROUTINE W REFLEX MICROSCOPIC
Bilirubin Urine: NEGATIVE
Glucose, UA: NEGATIVE mg/dL
Ketones, ur: NEGATIVE mg/dL
Leukocytes,Ua: NEGATIVE
Nitrite: NEGATIVE
Protein, ur: NEGATIVE mg/dL
Specific Gravity, Urine: 1.004 — ABNORMAL LOW (ref 1.005–1.030)
pH: 6 (ref 5.0–8.0)

## 2021-10-06 LAB — CBC WITH DIFFERENTIAL/PLATELET
Abs Immature Granulocytes: 0.02 10*3/uL (ref 0.00–0.07)
Basophils Absolute: 0.1 10*3/uL (ref 0.0–0.1)
Basophils Relative: 1 %
Eosinophils Absolute: 0.3 10*3/uL (ref 0.0–0.5)
Eosinophils Relative: 5 %
HCT: 40.5 % (ref 36.0–46.0)
Hemoglobin: 12.7 g/dL (ref 12.0–15.0)
Immature Granulocytes: 0 %
Lymphocytes Relative: 29 %
Lymphs Abs: 1.9 10*3/uL (ref 0.7–4.0)
MCH: 28.3 pg (ref 26.0–34.0)
MCHC: 31.4 g/dL (ref 30.0–36.0)
MCV: 90.2 fL (ref 80.0–100.0)
Monocytes Absolute: 0.4 10*3/uL (ref 0.1–1.0)
Monocytes Relative: 6 %
Neutro Abs: 3.9 10*3/uL (ref 1.7–7.7)
Neutrophils Relative %: 59 %
Platelets: 333 10*3/uL (ref 150–400)
RBC: 4.49 MIL/uL (ref 3.87–5.11)
RDW: 13.9 % (ref 11.5–15.5)
WBC: 6.6 10*3/uL (ref 4.0–10.5)
nRBC: 0 % (ref 0.0–0.2)

## 2021-10-06 LAB — RESP PANEL BY RT-PCR (FLU A&B, COVID) ARPGX2
Influenza A by PCR: NEGATIVE
Influenza B by PCR: NEGATIVE
SARS Coronavirus 2 by RT PCR: NEGATIVE

## 2021-10-06 LAB — COOXEMETRY PANEL
Carboxyhemoglobin: <0.3 % — ABNORMAL LOW (ref 0.5–1.5)
Methemoglobin: 1.7 % — ABNORMAL HIGH (ref 0.0–1.5)
O2 Saturation: 30.9 %
Total hemoglobin: 13 g/dL (ref 12.0–16.0)
Total oxygen content: 30.2 %

## 2021-10-06 NOTE — Discharge Instructions (Signed)
Please follow-up with Dr. Janese Banks and keep your hematology appointment with [facility].  If your shortness of breath returns or you develop other symptoms of concern, please return to the emergency department.

## 2021-10-06 NOTE — ED Triage Notes (Signed)
Pt presents to ED from home C/O SOB X 2 hours. Pt with hx met-HB. Pt reports SaO2 84% RA at home prior to arrival.

## 2021-10-06 NOTE — ED Provider Notes (Signed)
Patient is a 66 year old female with past medical history of methemoglobinemia presents today because of concern for cyanosis.  She was at work when she noticed that her eyelids bilaterally looked blue.  She denies associated symptoms with this.  Pulse ox at that time was 83%.  Arrival to ED initial pulse ox 88% however this resolved to high 90s without intervention.  On my evaluation she is no episodes of cyanosis and satting 99% on room air.  Methemoglobin level is 1.7.  Did recently finish a course of Bactrim.  Patient was seen by Dr. Janese Banks in the ED who agreed no other intervention necessary at this time.  She was observed for about 3 hours with no return of cyanosis.  Unclear if this was a transient episode of methemoglobinemia today but given she is now returned to baseline she is appropriate for discharge.   Rada Hay, MD 10/06/21 [phone]

## 2021-10-06 NOTE — ED Provider Notes (Signed)
[facility] Provider Note    Event Date/Time   First MD Initiated Contact with Patient 10/06/21 1446     (approximate)   History   No chief complaint on file.   HPI  Bethany Scott is a 66 y.o. female with history of methemoglobinemia, type 2 diabetes, hypertension and as listed in EMR presents to the emergency department for evaluation after having an episode where her eyelids were purple and she had mild circumoral cyanosis.  She works in a dermatology office and was able to check her oxygen saturation which read 85%.  She was diagnosed with methemoglobinemia in June which was seemingly associated with recent antibiotic use.  She has recently finished a course of Bactrim due to UTI.  She did have mild dyspnea earlier.  The cyanosis and the dyspnea have both resolved.  She feels back to baseline but was advised by hematology that she needs to come to the emergency department for further evaluation.[name]      Physical Exam   Triage Vital Signs: ED Triage Vitals  Enc Vitals Group     BP 10/06/21 1420 (!) 130/97     Pulse Rate 10/06/21 1420 99     Resp 10/06/21 1420 16     Temp 10/06/21 1423 98.2 F (36.8 C)     Temp Source 10/06/21 1423 Oral     SpO2 10/06/21 1420 (!) 88 %     Weight 10/06/21 1423 215 lb (97.5 kg)     Height 10/06/21 1423 '5\' 7"'$  (1.702 m)     Head Circumference --      Peak Flow --      Pain Score 10/06/21 1423 0     Pain Loc --      Pain Edu? --      Excl. in [place]? --     Most recent vital signs: Vitals:   10/06/21 1615 10/06/21 1630  BP: 134/79 131/76  Pulse: 85 93  Resp: 17 17  Temp:    SpO2: 97% 96%    General: Awake, no distress.  CV:  Good peripheral perfusion.  Resp:  Normal effort.  Breath sounds clear to auscultation Abd:  No distention.  Other:     ED Results / Procedures / Treatments   Labs (all labs ordered are listed, but only abnormal results are displayed) Labs Reviewed  BLOOD GAS, VENOUS - Abnormal;  Notable for the following components:      Result Value   pCO2, Ven 43 (*)    All other components within normal limits  COMPREHENSIVE METABOLIC PANEL - Abnormal; Notable for the following components:   Glucose, Bld 113 (*)    All other components within normal limits  URINALYSIS, ROUTINE W REFLEX MICROSCOPIC - Abnormal; Notable for the following components:   Color, Urine YELLOW (*)    APPearance CLEAR (*)    Specific Gravity, Urine 1.004 (*)    Hgb urine dipstick SMALL (*)    Bacteria, UA RARE (*)    All other components within normal limits  COOXEMETRY PANEL - Abnormal; Notable for the following components:   Carboxyhemoglobin <0.3 (*)    Methemoglobin 1.7 (*)    All other components within normal limits  RESP PANEL BY RT-PCR (FLU A&B, COVID) ARPGX2  CBC WITH DIFFERENTIAL/PLATELET     EKG  Sinus rhythm with a rate of 90   RADIOLOGY  Image interpreted and radiology report reviewed by me.  Chest x-ray negative for acute cardiopulmonary abnormality  PROCEDURES:  Critical Care performed: No  Procedures   MEDICATIONS ORDERED IN ED: Medications - No data to display   IMPRESSION / MDM / [place] / ED COURSE   I have reviewed the triage note.  Differential diagnosis includes, but is not limited to, methemoglobinemia exacerbation, cardiopulmonary event  The patient is on the cardiac monitor to evaluate for evidence of arrhythmia and/or significant heart rate changes.  66 year old female presenting to the emergency department for treatment and evaluation after experiencing an episode of cyanosis with decrease in O2 saturation.  See HPI for further details.  On exam, she has no cyanosis currently and denies shortness of breath.  Oxygen saturation on room air is 96% and vitals are otherwise stable.  Plan will be to get a screening exam to rule out any other source for symptoms that she experienced earlier.  Her diagnosis of methemoglobinemia has not been  thoroughly investigated as she has not been evaluated by hematology at [facility].  She currently sees Dr. Janese Banks who advised her to come to the emergency department today in case she may need an infusion of methylene blue.  Cooximeter panel is reassuring with the methemoglobin level of 1.7.  VBG, CMP, CBC, COVID, and influenza tests are all reassuring.   Dr. Janese Banks had come to see the patient here in the emergency department.  No additional testing or treatment is recommended at this time.  Plan of discharge discussed with the patient who feels relieved and is ready to be discharged home.  ER return precautions were discussed.  She was encouraged to keep the appointment with the specialist at [facility] as well as any upcoming appointments with local hematology.  FINAL CLINICAL IMPRESSION(S) / ED DIAGNOSES   Final diagnoses:  Methemoglobinemia     Rx / DC Orders   ED Discharge Orders     None        Note:  This document was prepared using Dragon voice recognition software and may include unintentional dictation errors.   Victorino Dike, FNP 10/06/21 1747    Rada Hay, MD 10/06/21 1800

## 2021-10-06 NOTE — ED Notes (Signed)
Assumed care, pt with no needs. Breathing e/u on room air, alert and oriented. IV placed pt tolerated well.

## 2021-10-07 ENCOUNTER — Other Ambulatory Visit: Payer: Self-pay

## 2021-10-07 LAB — BLOOD GAS, VENOUS
Acid-Base Excess: 0.8 mmol/L (ref 0.0–2.0)
Bicarbonate: 26 mmol/L (ref 20.0–28.0)
Patient temperature: 37
pCO2, Ven: 43 mmHg — ABNORMAL LOW (ref 44–60)
pH, Ven: 7.39 (ref 7.25–7.43)

## 2021-10-08 ENCOUNTER — Ambulatory Visit
Admission: RE | Admit: 2021-10-08 | Discharge: 2021-10-08 | Disposition: A | Discharge status: Home / Self Care | Payer: PPO | Source: Ambulatory Visit | Attending: Family Medicine | Admitting: Family Medicine

## 2021-10-08 DIAGNOSIS — N63 Unspecified lump in unspecified breast: Secondary | ICD-10-CM | POA: Diagnosis present

## 2021-10-08 DIAGNOSIS — R928 Other abnormal and inconclusive findings on diagnostic imaging of breast: Secondary | ICD-10-CM | POA: Insufficient documentation

## 2021-10-08 HISTORY — PX: BREAST BIOPSY: SHX20

## 2021-10-09 ENCOUNTER — Ambulatory Visit: Payer: PPO | Admitting: Physical Therapy

## 2021-10-09 LAB — SURGICAL PATHOLOGY

## 2021-10-13 ENCOUNTER — Ambulatory Visit: Payer: PPO | Admitting: Physical Therapy

## 2021-10-13 ENCOUNTER — Other Ambulatory Visit: Payer: Self-pay

## 2021-10-13 DIAGNOSIS — R293 Abnormal posture: Secondary | ICD-10-CM | POA: Diagnosis not present

## 2021-10-13 DIAGNOSIS — R2689 Other abnormalities of gait and mobility: Secondary | ICD-10-CM

## 2021-10-13 DIAGNOSIS — R278 Other lack of coordination: Secondary | ICD-10-CM

## 2021-10-13 DIAGNOSIS — M533 Sacrococcygeal disorders, not elsewhere classified: Secondary | ICD-10-CM

## 2021-10-13 MED ORDER — PROMETHAZINE HCL 25 MG PO TABS
ORAL_TABLET | ORAL | 0 refills | Status: DC
  Filled 2021-10-13 – 2021-11-26 (×2): qty 20, 7d supply, fill #0

## 2021-10-13 NOTE — Therapy (Signed)
OUTPATIENT PHYSICAL THERAPY Treatment    Patient Name: Bethany Scott MRN: [id number] DOB:14-Sep-1955, 66 y.o., female Today's Date: 10/13/2021   PT End of Session - 10/13/21 0850     Visit Number 5    Number of Visits 10    Date for PT Re-Evaluation 11/12/21    PT Start Time 0847    PT Stop Time 0930    PT Time Calculation (min) 43 min    Activity Tolerance Patient tolerated treatment well    Behavior During Therapy [facility] for tasks assessed/performed             Past Medical History:  Diagnosis Date   Allergy    Anemia    Anxiety    Arthritis    OA Knee; non-specific autoimmune process followed by [facility]/Rheumatology.   Asthma    Blood transfusion without reported diagnosis YRS AGO   COVID-19 03/2018   Depression    Gastric ulcer, unspecified as acute or chronic, without mention of hemorrhage, perforation, or obstruction YRS AGO   GERD (gastroesophageal reflux disease)    Hyperlipidemia    Hypertension    Methemoglobinemia    Migraine    hx of migraines   Obesity, unspecified    Other chronic cystitis    Personal history of colonic polyps    Pneumonia    PONV (postoperative nausea and vomiting)    NAUSEA, SCOPOLAMINE PATCH HELPED WITH LAST SURGERIES   Pre-diabetes    Past Surgical History:  Procedure Laterality Date   ABDOMINAL HYSTERECTOMY  01/25/1994   uterine fibroids; DUB; cervical dysplasia; ovaries resected two years later.     APPENDECTOMY     BILATERAL OOPHORECTOMY  2001   BREAST BIOPSY Left 10/08/2021   10:00 ribbon, [place] bx, path pending   CESAREAN SECTION     x 2   COLON SURGERY  YRS AGO   SIGMOIDECTOMY   Colonoscopy  09/08/2012   diverticulosis, hemorrhoids.  [name].  Symptoms:  anemia, hemoccult +.   ESOPHAGOGASTRODUODENOSCOPY  09/08/2012   +HH.  [name].  Symptoms: anemia, hemoccult +.   HIP ARTHROSCOPY Right    LAPAROSCOPIC GASTRIC SLEEVE RESECTION N/A 03/01/2016   Procedure: LAPAROSCOPIC GASTRIC SLEEVE RESECTION, UPPER ENDO;   Surgeon: [name], MD;  [facility]: [facility];  Service: General;  Laterality: N/A;   LAPAROSCOPIC LYSIS OF ADHESIONS N/A 02/10/2016   Procedure: LAPAROSCOPIC LYSIS OF ADHESIONS;  Surgeon: [name], MD;  [facility];  Service: General;  Laterality: N/A;   LAPAROSCOPY N/A 02/10/2016   Procedure: LAPAROSCOPY DIAGNOSTIC;  Surgeon: [name], MD;  [facility];  Service: General;  Laterality: N/A;   NASAL SEPTUM SURGERY     NECK SURGERY     Rheumatology Consult  11/26/2011   multiple arthralgias. Autoimmune labs negative.  Rx for Plaquenil for non-specific autoimmune process.  [name].   SHOULDER ARTHROSCOPY WITH OPEN ROTATOR CUFF REPAIR AND DISTAL CLAVICLE ACROMINECTOMY Right 01/13/2021   Procedure: SHOULDER ARTHROSCOPY WITH OPEN ROTATOR CUFF REPAIR AND DISTAL CLAVICLE ACROMINECTOMY;  Surgeon: Thornton Park, MD;  Location: [facility];  Service: Orthopedics;  Laterality: Right;   SHOULDER ARTHROSCOPY WITH SUBACROMIAL DECOMPRESSION Right 01/13/2021   Procedure: SHOULDER ARTHROSCOPY WITH SUBACROMIAL DECOMPRESSION, BICEPS TENOTOMY;  Surgeon: Thornton Park, MD;  Location: [facility];  Service: Orthopedics;  Laterality: Right;   SIGMOID RESECTION / RECTOPEXY     SPINE SURGERY     TONSILLECTOMY AND ADENOIDECTOMY     TOTAL HIP ARTHROPLASTY Right 09/27/2017   Procedure: TOTAL HIP ARTHROPLASTY ANTERIOR  APPROACH;  Surgeon: [name], MD;  Location: [facility];  Service: Orthopedics;  Laterality: Right;   TOTAL HIP ARTHROPLASTY Left 12/27/2017   Procedure: TOTAL HIP ARTHROPLASTY ANTERIOR APPROACH;  Surgeon: [name], MD;  Location: [facility];  Service: Orthopedics;  Laterality: Left;   Patient Active Problem List   Diagnosis Date Noted   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   Frequent episodes of sinusitis 05/19/2021   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[place]) 03/27/2021   Community acquired pneumonia of left lung    Acute respiratory failure with hypoxia ([place]) 09/18/2020   Sepsis ([place])  09/17/2020   Status post total hip replacement, 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   Status post total hip replacement, right 09/27/2017   Incomplete emptying of bladder 02/16/2017   Chronic cystitis 02/08/2017   Hematuria, microscopic 02/08/2017   History of nephrolithiasis 02/08/2017   Pelvic pain in female 02/08/2017   Chronic venous insufficiency 01/06/2017   Varicose veins of leg with pain, right 12/31/2016   GERD (gastroesophageal reflux disease) 03/01/2016   History of gastric bypass 03/01/2016   Hip arthritis 05/29/2015   Degenerative joint disease (DJD) of hip 07/24/2014   DJD (degenerative joint disease) of knee 06/17/2014   Greater trochanteric bursitis of both hips 06/17/2014   Degenerative joint disease of sacroiliac joint ([place]) 06/17/2014   Facet syndrome, lumbar 06/17/2014   DDD (degenerative disc disease), lumbar 06/17/2014   Low serum vitamin D 06/08/2013   Sinusitis, acute, maxillary 02/08/2012   Migraines 02/08/2012   Depression with anxiety 02/08/2012   Essential hypertension, benign 02/08/2012   Pure hypercholesterolemia 02/08/2012   Diabetes mellitus type 2 in obese ([place]) 02/08/2012    PCP: [name] MD   REFERRING PROVIDER: [name] MD   REFERRING DIAG: Urge Incontinence  Rationale for Evaluation and Treatment Rehabilitation  THERAPY DIAG:  Abnormal posture  Sacrococcygeal disorders, not elsewhere classified  Other abnormalities of gait and mobility  Other lack of coordination  ONSET DATE:  6 years +  SUBJECTIVE:                                                                                                                                                                                           SUBJECTIVE STATEMENT:     Last week, pt's oxygen level was 83% SO 2 and blueness around her eyes. Pt went to the ER , and by the time she got there, her oxygen levels went back from 90-100%   1) Straining and difficulty emptying her bladder/ SUI :  Pt 's  UTI has cleared.   2) CLBP: LBP is 2-3/10.   3) neck and shoulder pain:  Pt notices neck pain L turn still hurts     PERTINENT HISTORY:  oopherectomy, hysterectomy, gastric bypass, neck surgery, colon resection to remove polyp, sedentary work job , Hx of fall onto tailbone, B THA,  PAIN:  Are you having pain? Neck pain 3/10   PRECAUTIONS: None  WEIGHT BEARING RESTRICTIONS No  FALLS:  Has patient fallen in last 6 months? 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  PATIENT GOALS   Improve incontinence, get stronger down there    OBJECTIVE:   [facility] PT Assessment - 10/13/21 0858       AROM   Overall AROM Comments cervical ROM L 48 deg, R 55 deg with pain, cervical sidelfexion 35 eg , L 40 deg      Palpation   Spinal mobility tightness along midtrap/ rhomboids, upper trap, suprascapular mm R              [facility] Adult PT Treatment/Exercise - 10/13/21 0924       Therapeutic Activites    Other Therapeutic Activities facilitated call to [facility] because pt has not heard back from them re: appt      Neuro Re-ed    Neuro Re-ed Details  cued for isometric cervical/ scapular retraction in hooklying      Modalities   Modalities Moist Heat      Moist Heat Therapy   Number Minutes Moist Heat 5 Minutes    Moist Heat Location --   thoracic R, ( unbilled)     Manual Therapy   Manual therapy comments STM/MWM at problem areas noted in assessment              HOME EXERCISE PROGRAM: See pt instruction section    ASSESSMENT:  CLINICAL IMPRESSION: Addressed the R thoracic / neck/ shoulder mm tightness with manual Tx which pt tolerated without complaints. Pt demo'd increased cervical ROM and improved diaphragm excursion post Tx.  Pt reported decreased neck pain from 3/10 to 0/10 with rotation bilaterally.   Plan to add resistance band cervical/ scapular retraction strengthening and  assess abdominal scar restrictions at next session.   Pt benefits from skilled PT.    OBJECTIVE IMPAIRMENTS decreased activity tolerance, decreased coordination, decreased endurance, decreased mobility, difficulty walking, decreased ROM, decreased strength, decreased safety awareness, hypomobility, increased muscle spasms, impaired flexibility, improper body mechanics, postural dysfunction, and pain and scar restrictions   ACTIVITY LIMITATIONS   community, work, interpersonal   PARTICIPATION LIMITATIONS:  Neck and shoulder pain with vacuuming, mopping.  CLBP impacts getting up from a deep chair, walking > 1 hour    PERSONAL FACTORS  affecting patient's functional outcome: oopherectomy, hysterectomy, gastric bypass, neck surgery, colon resection to remove polyp, sedentary work job,  Hx of fall onto tailbone    REHAB POTENTIAL: Good   CLINICAL DECISION MAKING: Evolving/moderate complexity   EVALUATION COMPLEXITY: Moderate    PATIENT EDUCATION:    Education details: Showed pt anatomy images. Explained muscles attachments/ connection, physiology of deep core system/ spinal- thoracic-pelvis-lower kinetic chain as they relate to pt's presentation, Sx, and past Hx. Explained what and how these areas of deficits need to be restored to balance and function    See Therapeutic activity / neuromuscular re-education section   Person educated: Patient Education method: Explanation, Demonstration, Tactile cues,  Verbal cues, and Handouts Education comprehension: verbalized understanding, returned demonstration, verbal cues required, tactile cues required, and needs further education     PLAN: PT FREQUENCY: 1x/week   PT DURATION: 10 weeks   PLANNED INTERVENTIONS: Therapeutic exercises, Therapeutic activity, Neuromuscular re-education, Balance  training, Gait training, Patient/Family education, Self Care, Joint mobilization, Spinal mobilization, Moist heat, Taping, and Manual therapy.   PLAN FOR NEXT SESSION: See clinical impression for plan     GOALS: Goals reviewed with patient? Yes  SHORT TERM GOALS: Target date: 10/01/2021    Pt will demo IND with HEP                    Baseline: Not IND            Goal status: INITIAL   LONG TERM GOALS: Target date: 11/12/2021     Pt will demo proper deep core coordination without chest breathing and optimal excursion of diaphragm/pelvic floor in order to promote spinal stability and pelvic floor function  Baseline: dyscoordination Goal status: INITIAL  2.  Pt will demo > 5 pt change on FOTO  to improve QOL and function  Urinary Problem baseline -58 pts  PFDI Urinary  baseline -38 pts    Goal status: INITIAL  3.  Pt will demo proper body mechanics in against gravity tasks and ADLs  work tasks, fitness  to minimize straining pelvic floor / back         Baseline: not IND, improper form that places strain on pelvic floor                Goal status: INITIAL   4.  Pt will demo > 5 pt change on Lumbar FOTO to improve QOL and functional mobility  and to be able to walk > 1 hr with 50% less pain  Baseline:  53 pts   , walking > 1 hr causes 9/10 pain level   Goal status: INITIAL  5.  Pt will report no neck/ shoulder pain with with vacuuming, mopping.               Baseline:  pain Goal status: INITIAL  6.  Pt will demo levelled pelvic girdle and shoulder height in order to progress to deep core strengthening HEP and restore mobility at spine, pelvis, gait, posture   Baseline:  L iliac / R shoulder higher  Goal status: INITIAL   Jerl Mina, PT 10/13/2021, 9:25 AM

## 2021-10-13 NOTE — Patient Instructions (Signed)
   Neck / shoulder stretches:    Lying on back - small sushi roll towel under neck  _ 6 directions  5 reps  _elbow circles in and out to lower shoulder blades down and back  10 reps  _angel wings, lower elbows down , keep arms touching bed  10 reps     ___

## 2021-10-14 ENCOUNTER — Other Ambulatory Visit: Payer: Self-pay

## 2021-10-14 ENCOUNTER — Encounter: Payer: Self-pay | Admitting: Oncology

## 2021-10-15 ENCOUNTER — Other Ambulatory Visit: Payer: Self-pay

## 2021-10-16 ENCOUNTER — Other Ambulatory Visit: Payer: Self-pay

## 2021-10-16 ENCOUNTER — Other Ambulatory Visit: Payer: PPO

## 2021-10-16 MED ORDER — HYDROCODONE-ACETAMINOPHEN 10-325 MG PO TABS
ORAL_TABLET | ORAL | 0 refills | Status: DC
  Filled 2021-10-16: qty 150, 30d supply, fill #0

## 2021-10-23 ENCOUNTER — Other Ambulatory Visit: Payer: Self-pay

## 2021-10-23 MED ORDER — OZEMPIC (0.25 OR 0.5 MG/DOSE) 2 MG/3ML ~~LOC~~ SOPN
0.2500 mg | PEN_INJECTOR | SUBCUTANEOUS | 2 refills | Status: DC
  Filled 2021-10-23: qty 3, 28d supply, fill #0
  Filled 2021-11-26: qty 3, 28d supply, fill #1
  Filled 2022-01-22: qty 3, 28d supply, fill #2

## 2021-10-26 ENCOUNTER — Other Ambulatory Visit: Payer: Self-pay

## 2021-10-27 ENCOUNTER — Other Ambulatory Visit: Payer: Self-pay

## 2021-10-27 ENCOUNTER — Ambulatory Visit: Payer: PPO | Admitting: Physical Therapy

## 2021-10-27 MED ORDER — DOXYCYCLINE HYCLATE 100 MG PO TABS
ORAL_TABLET | ORAL | 0 refills | Status: DC
  Filled 2021-10-27: qty 20, 10d supply, fill #0

## 2021-10-27 MED ORDER — BENZONATATE 100 MG PO CAPS
ORAL_CAPSULE | ORAL | 0 refills | Status: DC
  Filled 2021-10-27: qty 45, 8d supply, fill #0

## 2021-10-29 ENCOUNTER — Other Ambulatory Visit: Payer: HMO

## 2021-10-29 ENCOUNTER — Ambulatory Visit: Payer: HMO | Admitting: Oncology

## 2021-11-02 ENCOUNTER — Ambulatory Visit: Payer: PPO | Attending: Family Medicine | Admitting: Physical Therapy

## 2021-11-02 DIAGNOSIS — G8929 Other chronic pain: Secondary | ICD-10-CM | POA: Insufficient documentation

## 2021-11-02 DIAGNOSIS — R2689 Other abnormalities of gait and mobility: Secondary | ICD-10-CM | POA: Insufficient documentation

## 2021-11-02 DIAGNOSIS — M542 Cervicalgia: Secondary | ICD-10-CM | POA: Insufficient documentation

## 2021-11-02 DIAGNOSIS — M5459 Other low back pain: Secondary | ICD-10-CM | POA: Insufficient documentation

## 2021-11-02 DIAGNOSIS — M25512 Pain in left shoulder: Secondary | ICD-10-CM | POA: Insufficient documentation

## 2021-11-02 DIAGNOSIS — M533 Sacrococcygeal disorders, not elsewhere classified: Secondary | ICD-10-CM | POA: Insufficient documentation

## 2021-11-02 DIAGNOSIS — R293 Abnormal posture: Secondary | ICD-10-CM | POA: Insufficient documentation

## 2021-11-02 DIAGNOSIS — R278 Other lack of coordination: Secondary | ICD-10-CM | POA: Insufficient documentation

## 2021-11-03 ENCOUNTER — Other Ambulatory Visit: Payer: Self-pay

## 2021-11-03 MED ORDER — HYDROCODONE-ACETAMINOPHEN 10-325 MG PO TABS
ORAL_TABLET | ORAL | 0 refills | Status: DC
  Filled 2021-12-11: qty 150, 25d supply, fill #0

## 2021-11-03 MED ORDER — HYDROCODONE-ACETAMINOPHEN 10-325 MG PO TABS
ORAL_TABLET | ORAL | 0 refills | Status: DC
  Filled 2021-11-13: qty 150, 25d supply, fill #0

## 2021-11-06 ENCOUNTER — Ambulatory Visit
Admission: RE | Admit: 2021-11-06 | Discharge: 2021-11-06 | Disposition: A | Discharge status: Home / Self Care | Payer: PPO | Source: Ambulatory Visit | Attending: Urology | Admitting: Urology

## 2021-11-06 DIAGNOSIS — N390 Urinary tract infection, site not specified: Secondary | ICD-10-CM | POA: Insufficient documentation

## 2021-11-06 DIAGNOSIS — N3946 Mixed incontinence: Secondary | ICD-10-CM | POA: Diagnosis present

## 2021-11-06 DIAGNOSIS — N3020 Other chronic cystitis without hematuria: Secondary | ICD-10-CM | POA: Insufficient documentation

## 2021-11-09 ENCOUNTER — Ambulatory Visit: Payer: PPO | Admitting: Physical Therapy

## 2021-11-13 ENCOUNTER — Other Ambulatory Visit: Payer: Self-pay

## 2021-11-16 ENCOUNTER — Other Ambulatory Visit: Payer: Self-pay

## 2021-11-16 ENCOUNTER — Encounter: Payer: PPO | Admitting: Physical Therapy

## 2021-11-16 ENCOUNTER — Ambulatory Visit: Payer: PPO | Admitting: Urology

## 2021-11-16 MED ORDER — MUPIROCIN 2 % EX OINT
1.0000 | TOPICAL_OINTMENT | Freq: Two times a day (BID) | CUTANEOUS | 2 refills | Status: DC
  Filled 2021-11-16: qty 22, 30d supply, fill #0
  Filled 2022-03-03: qty 22, 30d supply, fill #1
  Filled 2022-06-10: qty 22, 11d supply, fill #2

## 2021-11-19 ENCOUNTER — Ambulatory Visit: Payer: PPO | Admitting: Physical Therapy

## 2021-11-19 DIAGNOSIS — G8929 Other chronic pain: Secondary | ICD-10-CM | POA: Diagnosis present

## 2021-11-19 DIAGNOSIS — R2689 Other abnormalities of gait and mobility: Secondary | ICD-10-CM

## 2021-11-19 DIAGNOSIS — R278 Other lack of coordination: Secondary | ICD-10-CM

## 2021-11-19 DIAGNOSIS — M25512 Pain in left shoulder: Secondary | ICD-10-CM | POA: Diagnosis present

## 2021-11-19 DIAGNOSIS — M542 Cervicalgia: Secondary | ICD-10-CM | POA: Diagnosis present

## 2021-11-19 DIAGNOSIS — M533 Sacrococcygeal disorders, not elsewhere classified: Secondary | ICD-10-CM | POA: Diagnosis present

## 2021-11-19 DIAGNOSIS — M5459 Other low back pain: Secondary | ICD-10-CM

## 2021-11-19 DIAGNOSIS — R293 Abnormal posture: Secondary | ICD-10-CM | POA: Diagnosis not present

## 2021-11-19 NOTE — Therapy (Signed)
OUTPATIENT PHYSICAL THERAPY Treatment  / Recert    Patient Name: Bethany Scott MRN: [id number] DOB:10-16-1955, 66 y.o., female Today's Date: 11/19/2021   PT End of Session - 11/19/21 0902     Visit Number 6    Number of Visits 16    Date for PT Re-Evaluation 01/28/22    PT Start Time 0855    PT Stop Time 0935    PT Time Calculation (min) 40 min    Activity Tolerance Patient tolerated treatment well    Behavior During Therapy [facility] for tasks assessed/performed             Past Medical History:  Diagnosis Date   Allergy    Anemia    Anxiety    Arthritis    OA Knee; non-specific autoimmune process followed by [facility]heumatology.   Asthma    Blood transfusion without reported diagnosis YRS AGO   COVID-19 03/2018   Depression    Gastric ulcer, unspecified as acute or chronic, without mention of hemorrhage, perforation, or obstruction YRS AGO   GERD (gastroesophageal reflux disease)    Hyperlipidemia    Hypertension    Methemoglobinemia    Migraine    hx of migraines   Obesity, unspecified    Other chronic cystitis    Personal history of colonic polyps    Pneumonia    PONV (postoperative nausea and vomiting)    NAUSEA, SCOPOLAMINE PATCH HELPED WITH LAST SURGERIES   Pre-diabetes    Past Surgical History:  Procedure Laterality Date   ABDOMINAL HYSTERECTOMY  01/25/1994   uterine fibroids; DUB; cervical dysplasia; ovaries resected two years later.     APPENDECTOMY     BILATERAL OOPHORECTOMY  2001   BREAST BIOPSY Left 10/08/2021   10:00 ribbon, [place] bx, path pending   CESAREAN SECTION     x 2   COLON SURGERY  YRS AGO   SIGMOIDECTOMY   Colonoscopy  09/08/2012   diverticulosis, hemorrhoids.  [name].  Symptoms:  anemia, hemoccult +.   ESOPHAGOGASTRODUODENOSCOPY  09/08/2012   +HH.  [name].  Symptoms: anemia, hemoccult +.   HIP ARTHROSCOPY Right    LAPAROSCOPIC GASTRIC SLEEVE RESECTION N/A 03/01/2016   Procedure: LAPAROSCOPIC GASTRIC SLEEVE RESECTION,  UPPER ENDO;  Surgeon: [name], MD;  [facility];  Service: General;  Laterality: N/A;   LAPAROSCOPIC LYSIS OF ADHESIONS N/A 02/10/2016   Procedure: LAPAROSCOPIC LYSIS OF ADHESIONS;  Surgeon: [name], MD;  [facility];  Service: General;  Laterality: N/A;   LAPAROSCOPY N/A 02/10/2016   Procedure: LAPAROSCOPY DIAGNOSTIC;  Surgeon: [name], MD;  [facility];  Service: General;  Laterality: N/A;   NASAL SEPTUM SURGERY     NECK SURGERY     Rheumatology Consult  11/26/2011   multiple arthralgias. Autoimmune labs negative.  Rx for Plaquenil for non-specific autoimmune process.  [name].   SHOULDER ARTHROSCOPY WITH OPEN ROTATOR CUFF REPAIR AND DISTAL CLAVICLE ACROMINECTOMY Right 01/13/2021   Procedure: SHOULDER ARTHROSCOPY WITH OPEN ROTATOR CUFF REPAIR AND DISTAL CLAVICLE ACROMINECTOMY;  Surgeon: Thornton Park, MD;  Location: [facility];  Service: Orthopedics;  Laterality: Right;   SHOULDER ARTHROSCOPY WITH SUBACROMIAL DECOMPRESSION Right 01/13/2021   Procedure: SHOULDER ARTHROSCOPY WITH SUBACROMIAL DECOMPRESSION, BICEPS TENOTOMY;  Surgeon: Thornton Park, MD;  Location: [facility];  Service: Orthopedics;  Laterality: Right;   SIGMOID RESECTION / RECTOPEXY     SPINE SURGERY     TONSILLECTOMY AND ADENOIDECTOMY     TOTAL HIP ARTHROPLASTY Right 09/27/2017   Procedure: TOTAL  HIP ARTHROPLASTY ANTERIOR APPROACH;  Surgeon: [name], MD;  Location: [facility];  Service: Orthopedics;  Laterality: Right;   TOTAL HIP ARTHROPLASTY Left 12/27/2017   Procedure: TOTAL HIP ARTHROPLASTY ANTERIOR APPROACH;  Surgeon: [name], MD;  Location: [facility];  Service: Orthopedics;  Laterality: Left;   Patient Active Problem List   Diagnosis Date Noted   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   Frequent episodes of sinusitis 05/19/2021   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[place]) 03/27/2021   Community acquired pneumonia of left lung    Acute respiratory failure with hypoxia ([place]) 09/18/2020   Sepsis  ([place]) 09/17/2020   Status post total hip replacement, 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   Status post total hip replacement, right 09/27/2017   Incomplete emptying of bladder 02/16/2017   Chronic cystitis 02/08/2017   Hematuria, microscopic 02/08/2017   History of nephrolithiasis 02/08/2017   Pelvic pain in female 02/08/2017   Chronic venous insufficiency 01/06/2017   Varicose veins of leg with pain, right 12/31/2016   GERD (gastroesophageal reflux disease) 03/01/2016   History of gastric bypass 03/01/2016   Hip arthritis 05/29/2015   Degenerative joint disease (DJD) of hip 07/24/2014   DJD (degenerative joint disease) of knee 06/17/2014   Greater trochanteric bursitis of both hips 06/17/2014   Degenerative joint disease of sacroiliac joint ([place]) 06/17/2014   Facet syndrome, lumbar 06/17/2014   DDD (degenerative disc disease), lumbar 06/17/2014   Low serum vitamin D 06/08/2013   Sinusitis, acute, maxillary 02/08/2012   Migraines 02/08/2012   Depression with anxiety 02/08/2012   Essential hypertension, benign 02/08/2012   Pure hypercholesterolemia 02/08/2012   Diabetes mellitus type 2 in obese ([place]) 02/08/2012    PCP: [name] MD   REFERRING PROVIDER: [name] MD   REFERRING DIAG: Urge Incontinence  Rationale for Evaluation and Treatment Rehabilitation  THERAPY DIAG:  Abnormal posture  Sacrococcygeal disorders, not elsewhere classified  Other abnormalities of gait and mobility  Chronic left shoulder pain  Neck pain  Other lack of coordination  Other low back pain  ONSET DATE:  6 years +  SUBJECTIVE:                                                                                                                                                                                           SUBJECTIVE STATEMENT:     Pt has been walking every other 20-30 min. Pt has had multiple abdominal surgeries ( 2 C-sections, partial hysterectomy, complete hysterectomy, resection of  colon). She continues to have to strain with bowel movements which is impacting her ability to complete finish bowel movements and urination.  Pt feels something is the way with  urination.   1) Straining and difficulty emptying her bladder/ SUI :  Pt is still having difficulty with emptying bladder.    2) CLBP: LBP has improved and she is walking for 20 -3 min and having only 4/10 instead of 7/10.   3) neck and shoulder pain:                Pt notices neck pain with turning L is still tight    PERTINENT HISTORY:  oopherectomy, hysterectomy, gastric bypass, neck surgery, colon resection to remove polyp, sedentary work job , Hx of fall onto tailbone, B THA,  PAIN:  Are you having pain? Neck pain 3/10    PRECAUTIONS: None  WEIGHT BEARING RESTRICTIONS No  FALLS:  Has patient fallen in last 6 months? 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  PATIENT GOALS   Improve incontinence, get stronger down there    OBJECTIVE:     [facility] PT Assessment - 11/19/21 1009       Observation/Other Assessments   Observations less forward head/ thoracic kyphosis             [facility] Adult PT Treatment/Exercise - 11/19/21 1009       Therapeutic Activites    Other Therapeutic Activities reassessed goals, referred to psychotherapy and EACP for stress-management, administered FOTO , discussed remaining upcoming sessions for addressing goals      Neuro Re-ed    Neuro Re-ed Details  reviewed deep core level 2 technique                 HOME EXERCISE PROGRAM: See pt instruction section    ASSESSMENT:  CLINICAL IMPRESSION:   Pt has met 2 /7 goals and progressing well towards remaining goals.   LBP has improved and she is walking for 20 -3 min and having only 4/10 instead of  7/10. FOTO score improved.   Urinary issues with emptying bladder completely and L neck pain are still areas getting addressed.   Pt has had multiple abdominal surgeries ( 2 C-sections, partial hysterectomy, complete hysterectomy, resection of colon) and she continues to have to strain with bowel movements which is impacting her ability to complete finish bowel movements and urination.  Pt feels something is the way with urination. Future sessions will be focused on decreasing scar adhesions, optimize IAP system, coordinating pelvic floor for relaxed phase and optimal elimination of urine and bowels.   Discussed today importance to compliance of deep core level 2 HEP daily for optimal IAP system management for LBP and urinary/ bowel issues. Provided psychotherapist and EACP services for stress management.   Pt benefits from skilled PT. Anticipate pt will continue to advance towards remaining goals.    OBJECTIVE IMPAIRMENTS decreased activity tolerance, decreased coordination, decreased endurance, decreased mobility, difficulty walking, decreased ROM, decreased strength, decreased safety awareness, hypomobility, increased muscle spasms, impaired flexibility, improper body mechanics, postural dysfunction, and pain and scar restrictions   ACTIVITY LIMITATIONS   community, work, interpersonal   PARTICIPATION LIMITATIONS:  Neck and shoulder pain with vacuuming, mopping.  CLBP impacts getting up from a deep chair, walking > 1 hour    PERSONAL FACTORS  affecting patient's functional outcome: oopherectomy, hysterectomy, gastric bypass, neck surgery, colon resection to remove polyp, sedentary work job,  Hx of fall onto tailbone  REHAB POTENTIAL: Good   CLINICAL DECISION MAKING: Evolving/moderate complexity   EVALUATION COMPLEXITY: Moderate    PATIENT EDUCATION:    Education details: Showed pt anatomy images. Explained muscles attachments/ connection, physiology of deep core system/ spinal-  thoracic-pelvis-lower kinetic chain as they relate to pt's presentation, Sx, and past Hx. Explained what and how these areas of deficits need to be restored to balance and function    See Therapeutic activity / neuromuscular re-education section   Person educated: Patient Education method: Explanation, Demonstration, Tactile cues, Verbal cues, and Handouts Education comprehension: verbalized understanding, returned demonstration, verbal cues required, tactile cues required, and needs further education     PLAN: PT FREQUENCY: 1x/week   PT DURATION: 10 weeks   PLANNED INTERVENTIONS: Therapeutic exercises, Therapeutic activity, Neuromuscular re-education, Balance training, Gait training, Patient/Family education, Self Care, Joint mobilization, Spinal mobilization, Moist heat, Taping, and Manual therapy.   PLAN FOR NEXT SESSION: See clinical impression for plan     GOALS: Goals reviewed with patient? Yes  SHORT TERM GOALS: Target date: 10/01/2021    Pt will demo IND with HEP                    Baseline: Not IND            Goal status: MET   LONG TERM GOALS: Target date: 01/28/2022       Pt will demo proper deep core coordination without chest breathing and optimal excursion of diaphragm/pelvic floor in order to promote spinal stability and pelvic floor function  Baseline: dyscoordination Goal status:  MET   2.  Pt will demo > 5 pt change on FOTO  to improve QOL and function  Urinary Problem baseline -58 pts  ---> 59 pts ( -1 pt change)  PFDI Urinary  baseline -38 pts   --> 29 pt ( 9 pt change)    Goal status: On-going    3.  Pt will demo proper body mechanics in against gravity tasks and ADLs  work tasks, fitness  to minimize straining pelvic floor / back         Baseline: not IND, improper form that places strain on pelvic floor                Goal status: on-going    4.  Pt will demo > 5 pt change on Lumbar FOTO to improve QOL and functional mobility  and to be  able to walk > 1 hr with 50% less pain  Baseline:  53 pts   , walking > 1 hr causes 9/10 pain level   Goal status:   partially met   55 pts, walking 20-30 min, 4/10 pain   5.  Pt will report no neck/ shoulder pain with with vacuuming, mopping.               Baseline:  pain Goal status:  MET ( only tightness)   6.  Pt will demo levelled pelvic girdle and shoulder height in order to progress to deep core strengthening HEP and restore mobility at spine, pelvis, gait, posture   Baseline:  L iliac / R shoulder higher  Goal status: MET   7. Pt will demo decreased scar adhesions over abdominal surgeries and demo proper lengthening of pelvic floor mm and report no more need for straining with bowel movements in order to completely finish urination and bowel movements with less difficulty and without the feeling of something in the way with urination.  Baseline:  straining, adhesion scar restrictions over abdomen  Goal status: NEW   Jerl Mina, PT 11/19/2021, 9:03 AM

## 2021-11-23 ENCOUNTER — Ambulatory Visit: Payer: PPO | Admitting: Urology

## 2021-11-26 ENCOUNTER — Ambulatory Visit: Payer: PPO | Attending: Family Medicine | Admitting: Physical Therapy

## 2021-11-26 ENCOUNTER — Other Ambulatory Visit: Payer: Self-pay

## 2021-11-26 DIAGNOSIS — R293 Abnormal posture: Secondary | ICD-10-CM | POA: Insufficient documentation

## 2021-11-26 DIAGNOSIS — R278 Other lack of coordination: Secondary | ICD-10-CM | POA: Insufficient documentation

## 2021-11-26 DIAGNOSIS — M25512 Pain in left shoulder: Secondary | ICD-10-CM | POA: Insufficient documentation

## 2021-11-26 DIAGNOSIS — M533 Sacrococcygeal disorders, not elsewhere classified: Secondary | ICD-10-CM | POA: Insufficient documentation

## 2021-11-26 DIAGNOSIS — R2689 Other abnormalities of gait and mobility: Secondary | ICD-10-CM | POA: Insufficient documentation

## 2021-11-26 DIAGNOSIS — G8929 Other chronic pain: Secondary | ICD-10-CM | POA: Insufficient documentation

## 2021-11-26 DIAGNOSIS — M542 Cervicalgia: Secondary | ICD-10-CM | POA: Insufficient documentation

## 2021-11-26 DIAGNOSIS — M5459 Other low back pain: Secondary | ICD-10-CM | POA: Insufficient documentation

## 2021-11-27 ENCOUNTER — Other Ambulatory Visit: Payer: Self-pay

## 2021-11-30 ENCOUNTER — Other Ambulatory Visit: Payer: Self-pay

## 2021-12-03 ENCOUNTER — Ambulatory Visit: Payer: PPO | Admitting: Physical Therapy

## 2021-12-03 DIAGNOSIS — G8929 Other chronic pain: Secondary | ICD-10-CM | POA: Diagnosis present

## 2021-12-03 DIAGNOSIS — R278 Other lack of coordination: Secondary | ICD-10-CM

## 2021-12-03 DIAGNOSIS — M542 Cervicalgia: Secondary | ICD-10-CM | POA: Diagnosis present

## 2021-12-03 DIAGNOSIS — R293 Abnormal posture: Secondary | ICD-10-CM

## 2021-12-03 DIAGNOSIS — M533 Sacrococcygeal disorders, not elsewhere classified: Secondary | ICD-10-CM | POA: Diagnosis present

## 2021-12-03 DIAGNOSIS — M5459 Other low back pain: Secondary | ICD-10-CM

## 2021-12-03 DIAGNOSIS — R2689 Other abnormalities of gait and mobility: Secondary | ICD-10-CM | POA: Diagnosis present

## 2021-12-03 DIAGNOSIS — M25512 Pain in left shoulder: Secondary | ICD-10-CM | POA: Diagnosis present

## 2021-12-03 NOTE — Therapy (Signed)
OUTPATIENT PHYSICAL THERAPY Treatment     Patient Name: Bethany Scott MRN: [id number] DOB:[birth year], 66 y.o., female Today's Date: 12/03/2021   PT End of Session - 12/03/21 0852     Visit Number 7    Number of Visits 16    Date for PT Re-Evaluation 01/28/22    PT Start Time 0849    PT Stop Time 0930    PT Time Calculation (min) 41 min    Activity Tolerance Patient tolerated treatment well    Behavior During Therapy [facility] for tasks assessed/performed             Past Medical History:  Diagnosis Date   Allergy    Anemia    Anxiety    Arthritis    OA Knee; non-specific autoimmune process followed by [facility]/Rheumatology.   Asthma    Blood transfusion without reported diagnosis YRS AGO   COVID-19 03/2018   Depression    Gastric ulcer, unspecified as acute or chronic, without mention of hemorrhage, perforation, or obstruction YRS AGO   GERD (gastroesophageal reflux disease)    Hyperlipidemia    Hypertension    Methemoglobinemia    Migraine    hx of migraines   Obesity, unspecified    Other chronic cystitis    Personal history of colonic polyps    Pneumonia    PONV (postoperative nausea and vomiting)    NAUSEA, SCOPOLAMINE PATCH HELPED WITH LAST SURGERIES   Pre-diabetes    Past Surgical History:  Procedure Laterality Date   ABDOMINAL HYSTERECTOMY  01/25/1994   uterine fibroids; DUB; cervical dysplasia; ovaries resected two years later.     APPENDECTOMY     BILATERAL OOPHORECTOMY  2001   BREAST BIOPSY Left 10/08/2021   10:00 ribbon, [place] bx, path pending   CESAREAN SECTION     x 2   COLON SURGERY  YRS AGO   SIGMOIDECTOMY   Colonoscopy  09/08/2012   diverticulosis, hemorrhoids.  [name].  Symptoms:  anemia, hemoccult +.   ESOPHAGOGASTRODUODENOSCOPY  09/08/2012   +HH.  [name].  Symptoms: anemia, hemoccult +.   HIP ARTHROSCOPY Right    LAPAROSCOPIC GASTRIC SLEEVE RESECTION N/A 03/01/2016   Procedure: LAPAROSCOPIC GASTRIC SLEEVE RESECTION, UPPER ENDO;   Surgeon: [name], MD;  Location: [facility];  Service: General;  Laterality: N/A;   LAPAROSCOPIC LYSIS OF ADHESIONS N/A 02/10/2016   Procedure: LAPAROSCOPIC LYSIS OF ADHESIONS;  Surgeon: [name], MD;  Location: [facility];  Service: General;  Laterality: N/A;   LAPAROSCOPY N/A 02/10/2016   Procedure: LAPAROSCOPY DIAGNOSTIC;  Surgeon: [name], MD;  Location: [facility];  Service: General;  Laterality: N/A;   NASAL SEPTUM SURGERY     NECK SURGERY     Rheumatology Consult  11/26/2011   multiple arthralgias. Autoimmune labs negative.  Rx for Plaquenil for non-specific autoimmune process.  [name].   SHOULDER ARTHROSCOPY WITH OPEN ROTATOR CUFF REPAIR AND DISTAL CLAVICLE ACROMINECTOMY Right 01/13/2021   Procedure: SHOULDER ARTHROSCOPY WITH OPEN ROTATOR CUFF REPAIR AND DISTAL CLAVICLE ACROMINECTOMY;  Surgeon: Thornton Park, MD;  Location: [facility];  Service: Orthopedics;  Laterality: Right;   SHOULDER ARTHROSCOPY WITH SUBACROMIAL DECOMPRESSION Right 01/13/2021   Procedure: SHOULDER ARTHROSCOPY WITH SUBACROMIAL DECOMPRESSION, BICEPS TENOTOMY;  Surgeon: Thornton Park, MD;  Location: [facility];  Service: Orthopedics;  Laterality: Right;   SIGMOID RESECTION / RECTOPEXY     SPINE SURGERY     TONSILLECTOMY AND ADENOIDECTOMY     TOTAL HIP ARTHROPLASTY Right 09/27/2017   Procedure: TOTAL HIP ARTHROPLASTY  ANTERIOR APPROACH;  Surgeon: [name], MD;  Location: [facility];  Service: Orthopedics;  Laterality: Right;   TOTAL HIP ARTHROPLASTY Left 12/27/2017   Procedure: TOTAL HIP ARTHROPLASTY ANTERIOR APPROACH;  Surgeon: [name], MD;  Location: [facility];  Service: Orthopedics;  Laterality: Left;   Patient Active Problem List   Diagnosis Date Noted   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   Frequent episodes of sinusitis 05/19/2021   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[place]) 03/27/2021   Community acquired pneumonia of left lung    Acute respiratory failure with hypoxia ([place]) 09/18/2020   Sepsis ([place])  09/17/2020   Status post total hip replacement, 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   Status post total hip replacement, right 09/27/2017   Incomplete emptying of bladder 02/16/2017   Chronic cystitis 02/08/2017   Hematuria, microscopic 02/08/2017   History of nephrolithiasis 02/08/2017   Pelvic pain in female 02/08/2017   Chronic venous insufficiency 01/06/2017   Varicose veins of leg with pain, right 12/31/2016   GERD (gastroesophageal reflux disease) 03/01/2016   History of gastric bypass 03/01/2016   Hip arthritis 05/29/2015   Degenerative joint disease (DJD) of hip 07/24/2014   DJD (degenerative joint disease) of knee 06/17/2014   Greater trochanteric bursitis of both hips 06/17/2014   Degenerative joint disease of sacroiliac joint ([place]) 06/17/2014   Facet syndrome, lumbar 06/17/2014   DDD (degenerative disc disease), lumbar 06/17/2014   Low serum vitamin D 06/08/2013   Sinusitis, acute, maxillary 02/08/2012   Migraines 02/08/2012   Depression with anxiety 02/08/2012   Essential hypertension, benign 02/08/2012   Pure hypercholesterolemia 02/08/2012   Diabetes mellitus type 2 in obese ([place]) 02/08/2012    PCP: [name] MD   REFERRING PROVIDER: [name] MD   REFERRING DIAG: Urge Incontinence  Rationale for Evaluation and Treatment Rehabilitation  THERAPY DIAG:  Abnormal posture  Sacrococcygeal disorders, not elsewhere classified  Other abnormalities of gait and mobility  Chronic left shoulder pain  Neck pain  Other lack of coordination  Other low back pain  ONSET DATE:  6 years +  SUBJECTIVE:                                                                                                                                                                                           SUBJECTIVE STATEMENT:     Pt noticed leakage is better for sure. It is occurring less.     SUBJECTIVE STATEMENT on EVAL  09/03/21 :  1) Straining and difficulty emptying her bladder/ SUI :   Pt is still having difficulty with emptying bladder.    2) CLBP: LBP has improved and she is walking for 20 -  3 min and having only 4/10 instead of 7/10.   3) neck and shoulder pain:                Pt notices neck pain with turning L is still tight    PERTINENT HISTORY:  oopherectomy, hysterectomy, gastric bypass, neck surgery, colon resection to remove polyp, sedentary work job , Hx of fall onto tailbone, B THA,  PAIN:  Are you having pain? Neck pain 3/10    PRECAUTIONS: None  WEIGHT BEARING RESTRICTIONS No  FALLS:  Has patient fallen in last 6 months? No  LIVING ENVIRONMENT: Lives with: lives with their family Lives in: House/apartment Stairs: Yes: Internal: 1 flight rails, External 5 steps with rail    OCCUPATION:    Investment banker, operational at [employer]: Independent  PATIENT GOALS   Improve incontinence, get stronger down there    OBJECTIVE:   [facility] PT Assessment - 12/03/21 0926       Palpation   Palpation comment restricted abdominal scars, BR inguinal scar             [facility] Adult PT Treatment/Exercise - 12/03/21 0926       Neuro Re-ed    Neuro Re-ed Details  cued for scapulostabilization HEP, self-ab scar massage      Manual Therapy   Manual therapy comments Fascial releases with MWM at ab scar.  Noted skin tearing with no bleeding. Provided gauze/ bandaid for healing. Explained and showed pt this observation . Advised pt to apply olive/ coconut oil/ Vit E  with ab massage                HOME EXERCISE PROGRAM: See pt instruction section    ASSESSMENT:  CLINICAL IMPRESSION:  Pt is making progress with less leakage and continues to be compliant with deep core HEP.  Focused on decreasing abdominal scar restrictions which will help with IAP system.  Progressed to scapular strengthening with cues for more stabilization.  Pt benefits from skilled PT. Anticipate pt will continue to advance towards remaining goals.     OBJECTIVE IMPAIRMENTS decreased activity tolerance, decreased coordination, decreased endurance, decreased mobility, difficulty walking, decreased ROM, decreased strength, decreased safety awareness, hypomobility, increased muscle spasms, impaired flexibility, improper body mechanics, postural dysfunction, and pain and scar restrictions   ACTIVITY LIMITATIONS   community, work, interpersonal   PARTICIPATION LIMITATIONS:  Neck and shoulder pain with vacuuming, mopping.  CLBP impacts getting up from a deep chair, walking > 1 hour    PERSONAL FACTORS  affecting patient's functional outcome: oopherectomy, hysterectomy, gastric bypass, neck surgery, colon resection to remove polyp, sedentary work job,  Hx of fall onto tailbone    REHAB POTENTIAL: Good   CLINICAL DECISION MAKING: Evolving/moderate complexity   EVALUATION COMPLEXITY: Moderate    PATIENT EDUCATION:    Education details: Showed pt anatomy images. Explained muscles attachments/ connection, physiology of deep core system/ spinal- thoracic-pelvis-lower kinetic chain as they relate to pt's presentation, Sx, and past Hx. Explained what and how these areas of deficits need to be restored to balance and function    See Therapeutic activity / neuromuscular re-education section   Person educated: Patient Education method: Explanation, Demonstration, Tactile cues, Verbal cues, and Handouts Education comprehension: verbalized understanding, returned demonstration, verbal cues required, tactile cues required, and needs further education     PLAN: PT FREQUENCY: 1x/week   PT DURATION: 10 weeks   PLANNED INTERVENTIONS: Therapeutic exercises, Therapeutic activity, Neuromuscular re-education, Balance training, Gait  training, Patient/Family education, Self Care, Joint mobilization, Spinal mobilization, Moist heat, Taping, and Manual therapy.   PLAN FOR NEXT SESSION: See clinical impression for plan     GOALS: Goals reviewed with  patient? Yes  SHORT TERM GOALS: Target date: 10/01/2021    Pt will demo IND with HEP                    Baseline: Not IND            Goal status: MET   LONG TERM GOALS: Target date: 01/28/2022       Pt will demo proper deep core coordination without chest breathing and optimal excursion of diaphragm/pelvic floor in order to promote spinal stability and pelvic floor function  Baseline: dyscoordination Goal status:  MET   2.  Pt will demo > 5 pt change on FOTO  to improve QOL and function  Urinary Problem baseline -58 pts  ---> 59 pts ( -1 pt change)  PFDI Urinary  baseline -38 pts   --> 29 pt ( 9 pt change)    Goal status: On-going    3.  Pt will demo proper body mechanics in against gravity tasks and ADLs  work tasks, fitness  to minimize straining pelvic floor / back         Baseline: not IND, improper form that places strain on pelvic floor                Goal status: on-going    4.  Pt will demo > 5 pt change on Lumbar FOTO to improve QOL and functional mobility  and to be able to walk > 1 hr with 50% less pain  Baseline:  53 pts   , walking > 1 hr causes 9/10 pain level   Goal status:   partially met   55 pts, walking 20-30 min, 4/10 pain   5.  Pt will report no neck/ shoulder pain with with vacuuming, mopping.               Baseline:  pain Goal status:  MET ( only tightness)   6.  Pt will demo levelled pelvic girdle and shoulder height in order to progress to deep core strengthening HEP and restore mobility at spine, pelvis, gait, posture   Baseline:  L iliac / R shoulder higher  Goal status: MET   7. Pt will demo decreased scar adhesions over abdominal surgeries and demo proper lengthening of pelvic floor mm and report no more need for straining with bowel movements in order to completely finish urination and bowel movements with less difficulty and without the feeling of something in the way with urination.  Baseline:  straining, adhesion scar restrictions over  abdomen  Goal status: NEW   Jerl Mina, PT 12/03/2021, 9:30 AM

## 2021-12-03 NOTE — Patient Instructions (Signed)
Lying on back, knees bent    band under ballmounds  while laying on back w/ knees bent  "W" exercise  10 reps x 2 sets   Band is placed under feet, knees bent, feet are hip width apart Hold band with thumbs point out, keep upper arm and elbow touching the bed the whole time  - inhale and then exhale pull bands by bending elbows hands move in a "w"  (feel shoulder blades squeezing)   ________________   Oblique/ scapula stabilization   Opposite arm   Place band in "U"    band under ballmounds  while laying on back w/ knees bent     20 reps  on each side  Holding band from opposite thigh,  Inhale,    exhale then pull band across body while keeping elbow , shoulders, back of the head pressed down   ___  Use olive/ coconut / Vita E oil :  Abdominal massage upward from L,  R , center to belly button 3 stroke x 3 , pressure is gentle and light with all fingers flat, not using finger tips

## 2021-12-10 ENCOUNTER — Ambulatory Visit: Payer: PPO | Admitting: Physical Therapy

## 2021-12-10 ENCOUNTER — Other Ambulatory Visit: Payer: Self-pay

## 2021-12-11 ENCOUNTER — Other Ambulatory Visit: Payer: Self-pay

## 2021-12-21 ENCOUNTER — Ambulatory Visit: Payer: PPO | Admitting: Urology

## 2021-12-21 ENCOUNTER — Encounter: Payer: Self-pay | Admitting: Urology

## 2021-12-21 VITALS — BP 140/81 | HR 96 | Ht 67.0 in | Wt 215.0 lb

## 2021-12-21 DIAGNOSIS — N3946 Mixed incontinence: Secondary | ICD-10-CM | POA: Diagnosis not present

## 2021-12-21 NOTE — Addendum Note (Signed)
Addended by: Evelina Bucy on: 12/21/2021 04:18 PM   Modules accepted: Orders

## 2021-12-21 NOTE — Progress Notes (Signed)
12/21/2021 8:47 AM   Bethany Scott February 01, 1955 [id number]  Referring provider: [name], MD [place],  [place] [address]  Chief Complaint  Patient presents with   Follow-up    Discuss ultrasound    HPI: I was consulted to assist the patient's incontinence.  She sometimes leaks with coughing sneezing.  She can have urge incontinence of her bladder is too full.  She changes her underwear designed for incontinence once or twice a day.   She voids every 1 hour to 90 minutes and gets up once or twice a night.  Her flow was good but she has to concentrate.  She stops and starts but feels empty   She says she gets more than 12 bladder infections a year.  She has burning and urgency.  She had Pyridium at home and was taking Macrobid after intercourse.  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   She has had neck surgery and hysterectomy.   She is currently doing physical therapy for incontinence which helping   She cannot take cephalosporins and penicillin due to anaphylaxis.  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  She thinks she can take sulfa but they do not want her to take Macrodantin for unknown reason    Patient has mild mixed incontinence.  I am not convinced she is having as many urinary tract infections that she says she is.  She has a lot of issue with medication and Amoxil put her on prophylaxis.  I am not can order urodynamics I thought was reasonable to start her on Myrbetriq 50 mg samples and prescription and continue with physical therapy and call if culture positive.  I ordered a renal ultrasound and call if abnormal.   Today Frequency stable.  Last culture negative.  Ultrasound normal. Incontinence  dramatically better.  No infections.    PMH: Past Medical History:  Diagnosis Date   Allergy    Anemia    Anxiety    Arthritis    OA Knee; non-specific autoimmune process followed by [facility]/Rheumatology.   Asthma    Blood transfusion without reported diagnosis YRS AGO   COVID-19 03/2018   Depression    Gastric ulcer, unspecified as acute or chronic, without mention of hemorrhage, perforation, or obstruction YRS AGO   GERD (gastroesophageal reflux disease)    Hyperlipidemia    Hypertension    Methemoglobinemia    Migraine    hx of migraines   Obesity, unspecified    Other chronic cystitis    Personal history of colonic polyps    Pneumonia    PONV (postoperative nausea and vomiting)    NAUSEA, SCOPOLAMINE PATCH HELPED WITH LAST SURGERIES   Pre-diabetes     Surgical History: Past Surgical History:  Procedure Laterality Date   ABDOMINAL HYSTERECTOMY  01/25/1994   uterine fibroids; DUB; cervical dysplasia; ovaries resected two years later.     APPENDECTOMY     BILATERAL OOPHORECTOMY  2001   BREAST BIOPSY Left 10/08/2021   10:00 ribbon, [place] bx, path pending   CESAREAN SECTION     x 2   COLON SURGERY  YRS AGO   SIGMOIDECTOMY   Colonoscopy  09/08/2012   diverticulosis, hemorrhoids.  [name].  Symptoms:  anemia, hemoccult +.   ESOPHAGOGASTRODUODENOSCOPY  09/08/2012   +HH.  [name].  Symptoms: anemia, hemoccult +.   HIP ARTHROSCOPY Right    LAPAROSCOPIC GASTRIC SLEEVE RESECTION N/A 03/01/2016   Procedure: LAPAROSCOPIC GASTRIC SLEEVE RESECTION, UPPER ENDO;  Surgeon: [name], MD;  Location: [facility];  Service: General;  Laterality: N/A;   LAPAROSCOPIC LYSIS OF ADHESIONS N/A 02/10/2016   Procedure: LAPAROSCOPIC LYSIS OF ADHESIONS;  Surgeon: [name], MD;  Location: [facility];  Service: General;  Laterality: N/A;   LAPAROSCOPY N/A 02/10/2016   Procedure: LAPAROSCOPY DIAGNOSTIC;  Surgeon: [name], MD;  Location: [facility];  Service: General;  Laterality: N/A;    NASAL SEPTUM SURGERY     NECK SURGERY     Rheumatology Consult  11/26/2011   multiple arthralgias. Autoimmune labs negative.  Rx for Plaquenil for non-specific autoimmune process.  [name].   SHOULDER ARTHROSCOPY WITH OPEN ROTATOR CUFF REPAIR AND DISTAL CLAVICLE ACROMINECTOMY Right 01/13/2021   Procedure: SHOULDER ARTHROSCOPY WITH OPEN ROTATOR CUFF REPAIR AND DISTAL CLAVICLE ACROMINECTOMY;  Surgeon: Thornton Park, MD;  Location: [facility];  Service: Orthopedics;  Laterality: Right;   SHOULDER ARTHROSCOPY WITH SUBACROMIAL DECOMPRESSION Right 01/13/2021   Procedure: SHOULDER ARTHROSCOPY WITH SUBACROMIAL DECOMPRESSION, BICEPS TENOTOMY;  Surgeon: Thornton Park, MD;  Location: [facility];  Service: Orthopedics;  Laterality: Right;   SIGMOID RESECTION / RECTOPEXY     SPINE SURGERY     TONSILLECTOMY AND ADENOIDECTOMY     TOTAL HIP ARTHROPLASTY Right 09/27/2017   Procedure: TOTAL HIP ARTHROPLASTY ANTERIOR APPROACH;  Surgeon: [name], MD;  Location: [facility];  Service: Orthopedics;  Laterality: Right;   TOTAL HIP ARTHROPLASTY Left 12/27/2017   Procedure: TOTAL HIP ARTHROPLASTY ANTERIOR APPROACH;  Surgeon: [name], MD;  Location: [facility];  Service: Orthopedics;  Laterality: Left;    Home Medications:  Allergies as of 12/21/2021       Reactions   Cephalexin Anaphylaxis   Cephalosporins Anaphylaxis   Macrobid [nitrofurantoin Macrocrystal] Other (See Comments)   Causes methemoglobinemia   Penicillins Anaphylaxis, Other (See Comments)   Has patient had a PCN reaction causing immediate rash, facial/tongue/throat swelling, SOB or lightheadedness with hypotension: Yes Has patient had a PCN reaction causing severe rash involving mucus membranes or skin necrosis: No Has patient had a PCN reaction that required hospitalization Yes Has patient had a PCN reaction occurring within the last 10 years: No If all of the above answers are "NO", then may proceed with Cephalosporin use.    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 If you have any questions, ask your nurse or doctor.          STOP taking these medications    doxycycline 100 MG tablet Commonly known as: VIBRA-TABS Stopped by: Reece Packer, MD   Ibuprofen-Acetaminophen 125-250 MG Tabs Stopped by: Reece Packer, MD       TAKE these medications    albuterol 108 (90 Base) MCG/ACT inhaler Commonly known as: VENTOLIN HFA Inhale 1-2 puffs into the lungs every 6 (six) hours as needed for wheezing or shortness of breath.   ALPRAZolam 0.25 MG tablet Commonly known as: XANAX Take 1 tablet (0.25 mg total) by mouth 2 (two) times daily as needed for anxiety.   Azelastine-Fluticasone 137-50 MCG/ACT  Susp Place 2 sprays into the nose 2 (two) times a day.   benzonatate 100 MG capsule Commonly known as: TESSALON Take 1-2 capsules (100-200 mg total) by mouth 3 (three) times daily as needed for Cough   busPIRone 10 MG tablet Commonly known as: BUSPAR Take 1 tablet (10 mg total) by mouth 3 (three) times daily   DULoxetine 60 MG capsule Commonly known as: CYMBALTA Take 1 capsule (60 mg total) by mouth 2 (two) times daily   gabapentin 300 MG capsule Commonly known as: NEURONTIN Take 1-3 capsules (300-900 mg total) by mouth 3 (three) times daily   HYDROcodone-acetaminophen 10-325 MG tablet Commonly known as: NORCO Take 1 tablet by mouth every 4 (four) hours as needed for Pain (DNF 07/24/21) What changed: Another medication with the same name was removed. Continue taking this medication, and follow the directions you see here. Changed by: Reece Packer, MD   HYDROcodone-acetaminophen 10-325 MG tablet Commonly known as: NORCO Take 1 tablet by mouth every 4 (four) hours as needed for Pain What  changed: Another medication with the same name was removed. Continue taking this medication, and follow the directions you see here. Changed by: Reece Packer, MD   ketoconazole 2 % shampoo Commonly known as: NIZORAL apply three times per week, massage into scalp and leave in for 10 minutes before rinsing out   ketoconazole 2 % cream Commonly known as: NIZORAL Apply topically 2 (two) times daily.   latanoprost 0.005 % ophthalmic solution Commonly known as: XALATAN Place 1 drop into both eyes at bedtime.   levocetirizine 5 MG tablet Commonly known as: XYZAL Take 1 tablet (5 mg total) by mouth every evening   lidocaine 3 % Crea cream Commonly known as: LINDAMANTLE Apply to affected area once daily as needed (Apply topically.)   metFORMIN 500 MG tablet Commonly known as: GLUCOPHAGE Take 1 tablet (500 mg total) by mouth 2 (two) times daily with meals   methocarbamol 750 MG tablet Commonly known as: ROBAXIN Take 750 mg by mouth 3 (three) times daily as needed for muscle spasms.   mirabegron ER 50 MG Tb24 tablet Commonly known as: Myrbetriq Take 1 tablet (50 mg total) by mouth daily.   montelukast 10 MG tablet Commonly known as: SINGULAIR Take 1 tablet (10 mg total) by mouth at bedtime   mupirocin ointment 2 % Commonly known as: BACTROBAN Apply to affected area twice a day to corners of mouth (Apply 1 Application topically 2 (two) times daily. To corners of mouth)   omeprazole 40 MG capsule Commonly known as: PRILOSEC Take 40 mg by mouth 2 (two) times daily.   Ozempic (0.25 or 0.5 MG/DOSE) 2 MG/3ML Sopn Generic drug: Semaglutide(0.25 or 0.'5MG'$ /DOS) Inject 0.25 mg into the skin once a week.   promethazine 25 MG tablet Commonly known as: PHENERGAN Take 1 tablet (25 mg total) by mouth every 8 (eight) hours as needed for Nausea   sulfamethoxazole-trimethoprim 800-160 MG tablet Commonly known as: BACTRIM DS Take 1 tablet by mouth 2 (two) times daily.   Symbicort  160-4.5 MCG/ACT inhaler Generic drug: budesonide-formoterol INHALE 2 PUFFS BY MOUTH 2 TIMES DAILY   Wegovy 0.25 MG/0.5ML Soaj Generic drug: Semaglutide-Weight Management Inject 0.25 mg subcutaneously once a week (Inject 0.5 mLs (0.25 mg total) subcutaneously once a week)        Allergies:  Allergies  Allergen Reactions   Cephalexin Anaphylaxis   Cephalosporins Anaphylaxis   Macrobid [Nitrofurantoin Macrocrystal] Other (See Comments)    Causes methemoglobinemia  Penicillins Anaphylaxis and Other (See Comments)    Has patient had a PCN reaction causing immediate rash, facial/tongue/throat swelling, SOB or lightheadedness with hypotension: Yes Has patient had a PCN reaction causing severe rash involving mucus membranes or skin necrosis: No Has patient had a PCN reaction that required hospitalization Yes Has patient had a PCN reaction occurring within the last 10 years: No If all of the above answers are "NO", then may proceed with Cephalosporin use.    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    Family History: Family History  Problem Relation Age of Onset   Bipolar disorder Father    Transient ischemic attack Father    Dementia Father        secondary to head trauma   Hypertension Father    Diabetes Father    CAD Father    Cancer Mother 21       lung   Migraines Mother    Hypothyroidism Mother    Hypertension Sister    Atrial fibrillation Sister    Hypothyroidism Sister    Alcohol abuse Brother    Hypertension Brother    Alcohol abuse Brother    Atrial fibrillation Maternal Grandmother    COPD Maternal Grandmother    Diabetes Maternal Grandmother    Hyperlipidemia Maternal Grandmother    Diabetes Maternal Grandfather    Diabetes Paternal Grandfather    Heart disease Paternal Grandfather    Breast cancer Sister 60    Social History:   reports that she has quit smoking. Her smoking use included cigarettes. She has a 5.00 pack-year smoking history. She has been exposed to tobacco smoke. She has never used smokeless tobacco. She reports current alcohol use. She reports that she does not use drugs.  ROS:                                        Physical Exam: BP (!) 140/81   Pulse 96   Ht '5\' 7"'$  (1.702 m)   Wt 97.5 kg   BMI 33.67 kg/m   Constitutional:  Alert and oriented, No acute distress. HEENT: [place] AT, moist mucus membranes.  Trachea midline, no masses.  Laboratory Data: Lab Results  Component Value Date   WBC 6.6 10/06/2021   HGB 12.7 10/06/2021   HCT 40.5 10/06/2021   MCV 90.2 10/06/2021   PLT 333 10/06/2021    Lab Results  Component Value Date   CREATININE 0.82 10/06/2021    No results found for: "PSA"  No results found for: "TESTOSTERONE"  Lab Results  Component Value Date   HGBA1C 4.7 (L) 04/30/2017    Urinalysis    Component Value Date/Time   COLORURINE YELLOW (A) 10/06/2021 1711   APPEARANCEUR CLEAR (A) 10/06/2021 1711   APPEARANCEUR Cloudy (A) 09/21/2021 0922   LABSPEC 1.004 (L) 10/06/2021 1711   PHURINE 6.0 10/06/2021 1711   GLUCOSEU NEGATIVE 10/06/2021 1711   HGBUR SMALL (A) 10/06/2021 1711   BILIRUBINUR NEGATIVE 10/06/2021 1711   BILIRUBINUR Negative 09/21/2021 0922   KETONESUR NEGATIVE 10/06/2021 1711   PROTEINUR NEGATIVE 10/06/2021 1711   UROBILINOGEN >=8.0 (A) 04/05/2018 1633   NITRITE NEGATIVE 10/06/2021 1711   LEUKOCYTESUR NEGATIVE 10/06/2021 1711    Pertinent Imaging:   Assessment & Plan: Reassess durability in 4 months  1.  Mixed incontinence  - Urinalysis, Complete   No follow-ups on file.  Reece Packer, MD  [facility] [address] [place], [place] [address] [phone]

## 2021-12-22 ENCOUNTER — Other Ambulatory Visit (HOSPITAL_COMMUNITY): Payer: Self-pay

## 2021-12-23 ENCOUNTER — Other Ambulatory Visit: Payer: Self-pay

## 2021-12-23 MED ORDER — HYDROCORTISONE 2.5 % EX CREA: TOPICAL_CREAM | Freq: Two times a day (BID) | CUTANEOUS | 0 refills | Status: DC | PRN

## 2021-12-24 ENCOUNTER — Ambulatory Visit: Payer: PPO | Admitting: Physical Therapy

## 2021-12-29 ENCOUNTER — Encounter: Payer: Self-pay | Admitting: Oncology

## 2021-12-29 ENCOUNTER — Inpatient Hospital Stay: Payer: PPO | Attending: Oncology | Admitting: Oncology

## 2021-12-29 ENCOUNTER — Inpatient Hospital Stay: Payer: PPO

## 2021-12-29 ENCOUNTER — Encounter (HOSPITAL_COMMUNITY): Payer: Self-pay

## 2021-12-29 ENCOUNTER — Other Ambulatory Visit (HOSPITAL_COMMUNITY): Payer: Self-pay

## 2021-12-29 VITALS — BP 153/96 | HR 95 | Resp 18 | Wt 195.6 lb

## 2021-12-29 DIAGNOSIS — D749 Methemoglobinemia, unspecified: Secondary | ICD-10-CM

## 2021-12-29 LAB — COMPREHENSIVE METABOLIC PANEL
ALT: 9 U/L (ref 0–44)
AST: 13 U/L — ABNORMAL LOW (ref 15–41)
Albumin: 4.3 g/dL (ref 3.5–5.0)
Alkaline Phosphatase: 49 U/L (ref 38–126)
Anion gap: 9 (ref 5–15)
BUN: 9 mg/dL (ref 8–23)
CO2: 27 mmol/L (ref 22–32)
Calcium: 9.4 mg/dL (ref 8.9–10.3)
Chloride: 102 mmol/L (ref 98–111)
Creatinine, Ser: 0.67 mg/dL (ref 0.44–1.00)
GFR, Estimated: >60 mL/min (ref >60)
Glucose, Bld: 99 mg/dL (ref 70–99)
Potassium: 3.8 mmol/L (ref 3.5–5.1)
Sodium: 138 mmol/L (ref 135–145)
Total Bilirubin: 0.4 mg/dL (ref 0.3–1.2)
Total Protein: 7.5 g/dL (ref 6.5–8.1)

## 2021-12-29 LAB — CBC WITH DIFFERENTIAL/PLATELET
Abs Immature Granulocytes: 0.02 10*3/uL (ref 0.00–0.07)
Basophils Absolute: 0.1 10*3/uL (ref 0.0–0.1)
Basophils Relative: 1 %
Eosinophils Absolute: 0.8 10*3/uL — ABNORMAL HIGH (ref 0.0–0.5)
Eosinophils Relative: 10 %
HCT: 43.1 % (ref 36.0–46.0)
Hemoglobin: 14.2 g/dL (ref 12.0–15.0)
Immature Granulocytes: 0 %
Lymphocytes Relative: 21 %
Lymphs Abs: 1.7 10*3/uL (ref 0.7–4.0)
MCH: 30.2 pg (ref 26.0–34.0)
MCHC: 32.9 g/dL (ref 30.0–36.0)
MCV: 91.7 fL (ref 80.0–100.0)
Monocytes Absolute: 0.4 10*3/uL (ref 0.1–1.0)
Monocytes Relative: 5 %
Neutro Abs: 5 10*3/uL (ref 1.7–7.7)
Neutrophils Relative %: 63 %
Platelets: 363 10*3/uL (ref 150–400)
RBC: 4.7 MIL/uL (ref 3.87–5.11)
RDW: 12 % (ref 11.5–15.5)
WBC: 7.9 10*3/uL (ref 4.0–10.5)
nRBC: 0 % (ref 0.0–0.2)

## 2021-12-29 LAB — RETICULOCYTES
Immature Retic Fract: 7.2 % (ref 2.3–15.9)
RBC.: 4.66 MIL/uL (ref 3.87–5.11)
Retic Count, Absolute: 71.3 10*3/uL (ref 19.0–186.0)
Retic Ct Pct: 1.5 % (ref 0.4–3.1)

## 2021-12-31 LAB — HAPTOGLOBIN: Haptoglobin: 126 mg/dL (ref 37–355)

## 2022-01-01 ENCOUNTER — Ambulatory Visit: Payer: PPO | Admitting: Physical Therapy

## 2022-01-01 ENCOUNTER — Other Ambulatory Visit: Payer: Self-pay

## 2022-01-02 NOTE — Progress Notes (Signed)
Hematology/Oncology Consult note [facility]  [phone] [phone]  Patient Care Team: [name], MD as PCP - General (Family Medicine) Ammie Dalton, Okey Regal, MD (Obstetrics and Gynecology) Sindy Guadeloupe, MD as Consulting Physician (Oncology)   Name of the patient: Bethany Scott  [id number]  10/30/1955   Date of visit: 01/02/22  Diagnosis- 1.  History of drug-induced hemolytic anemia 2.  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  She also has a history of iron deficiency and folate acid deficiency.  Patient was admitted for bilateral pneumonia in August 2022 at that time she was treated with antibiotics.  At that time she was evaluated for a hemoglobin of 6.6 and underwent a comprehensive work-up which showed normal ferritin of 183 with a normal iron saturation folate level normal at 18.8 B12 levels normal at 494 immunoglobulins were normal haptoglobin was low initially at less than 10 and subsequently in May 2023 it was still low at 16 LDH has been normal.  She has had G6PD levels checked 3 different occasions over the last 1 month and they have been within normal limits.  Coombs test in April 2023 was negative.  PNH profile testing negative.  Her hemoglobin which was 6.6 in August 2022 subsequently recovered to 10.5-11 over the next 6 months.  Hemoglobinopathy evaluation negative. 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 She did not require steroids or other treatment for her hemolytic anemia.    Patient presented to the ER with symptoms of shortness of breath and her lips turning purple in June 2023.  She was given Azo and Macrobid recently for UTI.Patient had a coexemetry panel performed in the  hospital which showed an elevated methemoglobin level of 24.7%.  Carboxyhemoglobin level less than 0.3 and oxygen saturation on that sample was 95.4% patient was given methylene blue with improvement of her cyanosis.  No exposure to well water.  No other recent changes in medications.  Patient then had a second episode of methemoglobinemia that was less severe a few weeks later and patient presented to [facility]  Symptoms resolved with conservative management and did not require methylene blue.  Azo was thought to be the culprit drug    Interval history-patient was briefly in the ER in September 2023 for a self-limited episode of methemoglobinemia She had cyanosis around her eyes which resolved on its own with oxygen.  She did not require any hospitalization for this.  No more episodes since then.  ECOG PS- 0 Pain scale- 0   Review of systems- Review of Systems  Constitutional:  Negative for chills, fever, malaise/fatigue and weight loss.  HENT:  Negative for congestion, ear discharge and nosebleeds.   Eyes:  Negative for blurred vision.  Respiratory:  Negative for cough, hemoptysis, sputum production, shortness of breath and wheezing.   Cardiovascular:  Negative for chest pain, palpitations, orthopnea and claudication.  Gastrointestinal:  Negative for abdominal pain, blood in stool, constipation, diarrhea, heartburn, melena, nausea and vomiting.  Genitourinary:  Negative for dysuria, flank pain, frequency, hematuria and urgency.  Musculoskeletal:  Negative for back pain, joint pain and myalgias.  Skin:  Negative for rash.  Neurological:  Negative for dizziness, tingling, focal weakness, seizures, weakness and headaches.  Endo/Heme/Allergies:  Does not bruise/bleed easily.  Psychiatric/Behavioral:  Negative for depression and suicidal ideas. The patient does not have insomnia.       Allergies  Allergen Reactions   Cephalexin Anaphylaxis   Cephalosporins Anaphylaxis   Macrobid [Nitrofurantoin  Macrocrystal] Other (See Comments)    Causes methemoglobinemia   Penicillins Anaphylaxis and Other (See Comments)    Has patient had a PCN reaction causing immediate rash, facial/tongue/throat swelling, SOB or lightheadedness with hypotension: Yes Has patient had a PCN reaction causing severe rash involving mucus membranes or skin necrosis: No Has patient had a PCN reaction that required hospitalization Yes Has patient had a PCN reaction occurring within the last 10 years: No If all of the above answers are "NO", then may proceed with Cephalosporin use.    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     Past Medical History:  Diagnosis Date   Allergy    Anemia    Anxiety    Arthritis    OA Knee; non-specific autoimmune process followed by [facility]/Rheumatology.   Asthma    Blood transfusion without reported diagnosis YRS AGO   COVID-19 03/2018   Depression    Gastric ulcer, unspecified as acute or chronic, without mention of hemorrhage, perforation, or obstruction YRS AGO   GERD (gastroesophageal reflux disease)    Hyperlipidemia    Hypertension    Methemoglobinemia    Migraine    hx of migraines   Obesity, unspecified    Other chronic cystitis    Personal history of colonic polyps    Pneumonia    PONV (postoperative nausea and vomiting)    NAUSEA, SCOPOLAMINE PATCH HELPED WITH LAST SURGERIES   Pre-diabetes      Past Surgical History:  Procedure Laterality Date   ABDOMINAL HYSTERECTOMY  01/25/1994   uterine fibroids; DUB; cervical dysplasia; ovaries resected two years later.     APPENDECTOMY     BILATERAL OOPHORECTOMY  2001   BREAST BIOPSY Left 10/08/2021   10:00 ribbon, [place] bx, path pending   CESAREAN SECTION     x 2   COLON SURGERY  YRS AGO   SIGMOIDECTOMY   Colonoscopy  09/08/2012   diverticulosis, hemorrhoids.  [name].  Symptoms:   anemia, hemoccult +.   ESOPHAGOGASTRODUODENOSCOPY  09/08/2012   +HH.  [name].  Symptoms: anemia, hemoccult +.   HIP ARTHROSCOPY Right    LAPAROSCOPIC GASTRIC SLEEVE RESECTION N/A 03/01/2016   Procedure: LAPAROSCOPIC GASTRIC SLEEVE RESECTION, UPPER ENDO;  Surgeon: [name], MD;  Location: [facility];  Service: General;  Laterality: N/A;   LAPAROSCOPIC LYSIS OF ADHESIONS N/A 02/10/2016   Procedure: LAPAROSCOPIC LYSIS OF ADHESIONS;  Surgeon: [name], MD;  Location: [facility];  Service: General;  Laterality: N/A;   LAPAROSCOPY N/A 02/10/2016   Procedure: LAPAROSCOPY DIAGNOSTIC;  Surgeon: [name], MD;  Location: [facility];  Service: General;  Laterality: N/A;   NASAL SEPTUM SURGERY     NECK SURGERY     Rheumatology Consult  11/26/2011   multiple arthralgias. Autoimmune labs negative.  Rx for Plaquenil for non-specific autoimmune process.  [name].   SHOULDER ARTHROSCOPY WITH OPEN ROTATOR CUFF REPAIR AND DISTAL CLAVICLE ACROMINECTOMY Right 01/13/2021   Procedure: SHOULDER ARTHROSCOPY WITH OPEN ROTATOR CUFF REPAIR AND DISTAL CLAVICLE ACROMINECTOMY;  Surgeon: Thornton Park, MD;  Location: [facility];  Service: Orthopedics;  Laterality: Right;   SHOULDER ARTHROSCOPY WITH SUBACROMIAL DECOMPRESSION Right 01/13/2021   Procedure: SHOULDER ARTHROSCOPY WITH  SUBACROMIAL DECOMPRESSION, BICEPS TENOTOMY;  Surgeon: Thornton Park, MD;  Location: [facility];  Service: Orthopedics;  Laterality: Right;   SIGMOID RESECTION / RECTOPEXY     SPINE SURGERY     TONSILLECTOMY AND ADENOIDECTOMY     TOTAL HIP ARTHROPLASTY Right 09/27/2017   Procedure: TOTAL HIP ARTHROPLASTY ANTERIOR APPROACH;  Surgeon: [name], MD;  Location: [facility];  Service: Orthopedics;  Laterality: Right;   TOTAL HIP ARTHROPLASTY Left 12/27/2017   Procedure: TOTAL HIP ARTHROPLASTY ANTERIOR APPROACH;  Surgeon: [name], MD;  Location: [facility];  Service: Orthopedics;  Laterality: Left;    Social History   Socioeconomic History    Marital status: Married    Spouse name: Not on file   Number of children: 2   Years of education: 12   Highest education level: Not on file  Occupational History   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      Children: 2 sons (24, 10); 2 grandchildren  (64yo Atlast, 34 yo August)      Lives: with husband, [name]/son.      Employment: check in at [employer] in [place] x 2004; loves job.      Tobacco abuse:  Former smoker.      Alcohol:  None; holidays only      Exercise: none in 2017     Seatbelt: 100%; no texting   Social Determinants of Health   Financial Resource Strain: Not on file  Food Insecurity: Not on file  Transportation Needs: Not on file  Physical Activity: Not on file  Stress: Not on file  Social Connections: Not on file  Intimate Partner Violence: Not on file    Family History  Problem Relation Age of Onset   Bipolar disorder Father    Transient ischemic attack Father    Dementia Father        secondary to head trauma   Hypertension Father    Diabetes Father    CAD Father    Cancer Mother 54       lung   Migraines Mother    Hypothyroidism Mother    Hypertension Sister    Atrial fibrillation Sister    Hypothyroidism Sister    Alcohol abuse Brother    Hypertension Brother    Alcohol abuse Brother    Atrial fibrillation  Maternal Grandmother    COPD Maternal Grandmother    Diabetes Maternal Grandmother    Hyperlipidemia Maternal Grandmother    Diabetes Maternal Grandfather    Diabetes Paternal Grandfather    Heart disease Paternal Grandfather    Breast cancer Sister 75     Current Outpatient Medications:    Azelastine-Fluticasone 137-50 MCG/ACT SUSP, Place 2 sprays into the nose 2 (two) times a  day., Disp: 23 g, Rfl: 0   budesonide-formoterol (SYMBICORT) 160-4.5 MCG/ACT inhaler, INHALE 2 PUFFS BY MOUTH 2 TIMES DAILY, Disp: 10.2 g, Rfl: 2   busPIRone (BUSPAR) 10 MG tablet, Take 1 tablet (10 mg total) by mouth 3 (three) times daily, Disp: 270 tablet, Rfl: 2   DULoxetine (CYMBALTA) 60 MG capsule, Take 1 capsule (60 mg total) by mouth 2 (two) times daily, Disp: 180 capsule, Rfl: 3   gabapentin (NEURONTIN) 300 MG capsule, Take 1-3 capsules (300-900 mg total) by mouth 3 (three) times daily, Disp: 810 capsule, Rfl: 3   HYDROcodone-acetaminophen (NORCO) 10-325 MG tablet, Take 1 tablet by mouth every 4 (four) hours as needed for Pain (DNF 07/24/21), Disp: 150 tablet, Rfl: 0   ketoconazole (NIZORAL) 2 % cream, Apply topically 2 (two) times daily., Disp: , Rfl:    ketoconazole (NIZORAL) 2 % shampoo, apply three times per week, massage into scalp and leave in for 10 minutes before rinsing out, Disp: 120 mL, Rfl: 11   methocarbamol (ROBAXIN) 750 MG tablet, Take 750 mg by mouth 3 (three) times daily as needed for muscle spasms., Disp: , Rfl:    mirabegron ER (MYRBETRIQ) 50 MG TB24 tablet, Take 1 tablet (50 mg total) by mouth daily., Disp: 30 tablet, Rfl: 11   montelukast (SINGULAIR) 10 MG tablet, Take 1 tablet (10 mg total) by mouth at bedtime, Disp: 90 tablet, Rfl: 4   mupirocin ointment (BACTROBAN) 2 %, Apply 1 Application topically 2 (two) times daily. To corners of mouth, Disp: 22 g, Rfl: 2   omeprazole (PRILOSEC) 40 MG capsule, Take 40 mg by mouth 2 (two) times daily., Disp: , Rfl:    Semaglutide,0.25 or 0.'5MG'$ /DOS,  (OZEMPIC, 0.25 OR 0.5 MG/DOSE,) 2 MG/3ML SOPN, Inject 0.25 mg into the skin once a week., Disp: 3 mL, Rfl: 2   albuterol (VENTOLIN HFA) 108 (90 Base) MCG/ACT inhaler, Inhale 1-2 puffs into the lungs every 6 (six) hours as needed for wheezing or shortness of breath. (Patient not taking: Reported on 12/29/2021), Disp: 1 each, Rfl: 0   ALPRAZolam (XANAX) 0.25 MG tablet, Take 1 tablet (0.25 mg total) by mouth 2 (two) times daily as needed for anxiety. (Patient not taking: Reported on 12/29/2021), Disp: 20 tablet, Rfl: 0   hydrocortisone 2.5 % cream, Apply topically 2 (two) times daily as needed (Rash). At corners of mouth for up to one week., Disp: 20 g, Rfl: 0   latanoprost (XALATAN) 0.005 % ophthalmic solution, Place 1 drop into both eyes at bedtime., Disp: , Rfl:    levocetirizine (XYZAL) 5 MG tablet, Take 1 tablet (5 mg total) by mouth every evening, Disp: 90 tablet, Rfl: 4   lidocaine (LINDAMANTLE) 3 % CREA cream, Apply topically., Disp: 28.3 g, Rfl: 0   metFORMIN (GLUCOPHAGE) 500 MG tablet, Take 1 tablet (500 mg total) by mouth 2 (two) times daily with meals (Patient not taking: Reported on 12/29/2021), Disp: 60 tablet, Rfl: 11   promethazine (PHENERGAN) 25 MG tablet, Take 1 tablet (25 mg total) by mouth every 8 (eight) hours as needed for Nausea (Patient not taking: Reported on 12/29/2021), Disp: 20 tablet, Rfl: 0   Semaglutide-Weight Management (WEGOVY) 0.25 MG/0.5ML SOAJ, Inject 0.5 mLs (0.25 mg total) subcutaneously once a week (Patient not taking: Reported on 12/29/2021), Disp: 2 mL, Rfl: 0   sulfamethoxazole-trimethoprim (BACTRIM DS) 800-160 MG tablet, Take 1 tablet by mouth 2 (two) times daily. (Patient not taking: Reported on 12/29/2021), Disp: 10 tablet, Rfl: 0  Physical exam:  Vitals:  12/29/21 1131  BP: (!) 153/96  Pulse: 95  Resp: 18  SpO2: 98%  Weight: 195 lb 9.6 oz (88.7 kg)   Physical Exam Cardiovascular:     Rate and Rhythm: Normal rate and regular rhythm.     Heart sounds:  Normal heart sounds.  Pulmonary:     Effort: Pulmonary effort is normal.     Breath sounds: Normal breath sounds.  Skin:    General: Skin is warm and dry.  Neurological:     Mental Status: She is alert and oriented to person, place, and time.         Latest Ref Rng & Units 12/29/2021   11:01 AM  CMP  Glucose 70 - 99 mg/dL 99   BUN 8 - 23 mg/dL 9   Creatinine 0.44 - 1.00 mg/dL 0.67   Sodium 135 - 145 mmol/L 138   Potassium 3.5 - 5.1 mmol/L 3.8   Chloride 98 - 111 mmol/L 102   CO2 22 - 32 mmol/L 27   Calcium 8.9 - 10.3 mg/dL 9.4   Total Protein 6.5 - 8.1 g/dL 7.5   Total Bilirubin 0.3 - 1.2 mg/dL 0.4   Alkaline Phos 38 - 126 U/L 49   AST 15 - 41 U/L 13   ALT 0 - 44 U/L 9       Latest Ref Rng & Units 12/29/2021   11:01 AM  CBC  WBC 4.0 - 10.5 K/uL 7.9   Hemoglobin 12.0 - 15.0 g/dL 14.2   Hematocrit 36.0 - 46.0 % 43.1   Platelets 150 - 400 K/uL 363     Assessment and plan- Patient is a 66 y.o. female for routine follow-up of methemoglobinemia  Patient has had episodes of clinically significant methemoglobinemia so far which have been attributed to Macrobid and Azo respectively.  The first episode did require her to get methylene blue.  Patient had another small episode in September 2023 when her skin around eyes turned purple and she went to the ER.  Symptoms resolved on its own.  Triggers for these random episodes are presently unclear.  She does have a second opinion coming up at [facility] soon.  She also has a prior history of drug-induced hemolytic anemia attributed to Levaquin.  Presently there is no evidence of hemolysis and her labs are unremarkable.  Patient does get frequent episodes of UTI and therefore eliminating these antibiotics could be challenging for the patient.  I will see her back in 6 months with labs.   Visit Diagnosis 1. Methemoglobinemia      Dr. Randa Evens, MD, MPH [facility] [phone] 01/02/2022 4:36  PM

## 2022-01-04 ENCOUNTER — Other Ambulatory Visit: Payer: Self-pay

## 2022-01-05 ENCOUNTER — Other Ambulatory Visit: Payer: Self-pay

## 2022-01-06 ENCOUNTER — Other Ambulatory Visit: Payer: Self-pay

## 2022-01-06 MED ORDER — HYDROCODONE-ACETAMINOPHEN 10-325 MG PO TABS
ORAL_TABLET | ORAL | 0 refills | Status: DC
  Filled 2022-01-08: qty 150, 30d supply, fill #0

## 2022-01-07 ENCOUNTER — Ambulatory Visit: Payer: PPO | Admitting: Physical Therapy

## 2022-01-08 ENCOUNTER — Other Ambulatory Visit: Payer: Self-pay

## 2022-01-08 ENCOUNTER — Encounter: Payer: PPO | Admitting: Physical Therapy

## 2022-01-13 ENCOUNTER — Encounter: Payer: PPO | Admitting: Physical Therapy

## 2022-01-14 ENCOUNTER — Encounter: Payer: PPO | Admitting: Physical Therapy

## 2022-01-21 ENCOUNTER — Encounter: Payer: PPO | Admitting: Physical Therapy

## 2022-01-22 ENCOUNTER — Telehealth: Payer: Self-pay | Admitting: Urology

## 2022-01-22 ENCOUNTER — Other Ambulatory Visit: Payer: Self-pay

## 2022-01-22 NOTE — Telephone Encounter (Signed)
Patient came to office today to pick up Myrbetriq samples. She states that her insurance has informed her that after the first of the year, the cost will increase to over $400.00. She states that she cannot afford to pay this much. She asked if there is something else she can take in place of Myrbetriq. She is concerned because this is the only medication that has worked for her.

## 2022-01-28 ENCOUNTER — Other Ambulatory Visit: Payer: Self-pay

## 2022-01-29 ENCOUNTER — Other Ambulatory Visit: Payer: Self-pay

## 2022-01-29 MED ORDER — TOLTERODINE TARTRATE ER 4 MG PO CP24
4.0000 mg | ORAL_CAPSULE | Freq: Every day | ORAL | 11 refills | Status: DC
  Filled 2022-01-29: qty 30, 30d supply, fill #0

## 2022-01-29 NOTE — Telephone Encounter (Signed)
Bjorn Loser, MD  You23 minutes ago (12:01 PM)    Detrol LA 4 mg 30x11

## 2022-01-29 NOTE — Telephone Encounter (Signed)
Left detailed message on VM, rx sent to pharmacy by e-script Advised pt to call if any questions or problems

## 2022-02-01 ENCOUNTER — Other Ambulatory Visit: Payer: Self-pay

## 2022-02-01 MED ORDER — ESTRADIOL 0.1 MG/GM VA CREA
TOPICAL_CREAM | VAGINAL | 4 refills | Status: DC
  Filled 2022-02-01: qty 42.5, 30d supply, fill #0
  Filled 2022-03-03: qty 42.5, 30d supply, fill #1
  Filled 2022-06-10: qty 42.5, 30d supply, fill #2
  Filled 2022-07-21: qty 42.5, 70d supply, fill #3
  Filled 2022-10-06 (×2): qty 42.5, 70d supply, fill #4

## 2022-02-05 ENCOUNTER — Other Ambulatory Visit: Payer: Self-pay

## 2022-02-05 MED ORDER — HYDROCODONE-ACETAMINOPHEN 10-325 MG PO TABS
1.0000 | ORAL_TABLET | ORAL | 0 refills | Status: DC | PRN
  Filled 2022-02-05: qty 150, 25d supply, fill #0

## 2022-02-05 MED ORDER — HYDROCODONE-ACETAMINOPHEN 10-325 MG PO TABS
ORAL_TABLET | ORAL | 0 refills | Status: DC
  Filled 2022-04-02: qty 150, 25d supply, fill #0

## 2022-02-05 MED ORDER — RSVPREF3 VAC RECOMB ADJUVANTED 120 MCG/0.5ML IM SUSR
INTRAMUSCULAR | 0 refills | Status: DC
  Filled 2022-02-05: qty 1, 1d supply, fill #0

## 2022-02-05 MED ORDER — HYDROCODONE-ACETAMINOPHEN 10-325 MG PO TABS
1.0000 | ORAL_TABLET | ORAL | 0 refills | Status: DC | PRN
  Filled 2022-07-23: qty 150, 25d supply, fill #0

## 2022-02-12 ENCOUNTER — Other Ambulatory Visit: Payer: Self-pay

## 2022-02-12 MED ORDER — PROMETHAZINE HCL 25 MG PO TABS
25.0000 mg | ORAL_TABLET | Freq: Three times a day (TID) | ORAL | 0 refills | Status: DC | PRN
  Filled 2022-02-12: qty 20, 7d supply, fill #0

## 2022-02-17 ENCOUNTER — Other Ambulatory Visit: Payer: Self-pay

## 2022-02-18 ENCOUNTER — Other Ambulatory Visit: Payer: Self-pay

## 2022-02-18 MED ORDER — OZEMPIC (0.25 OR 0.5 MG/DOSE) 2 MG/3ML ~~LOC~~ SOPN
0.2500 mg | PEN_INJECTOR | SUBCUTANEOUS | 2 refills | Status: DC
  Filled 2022-02-18: qty 3, 28d supply, fill #0

## 2022-02-19 ENCOUNTER — Other Ambulatory Visit: Payer: Self-pay

## 2022-03-01 ENCOUNTER — Other Ambulatory Visit: Payer: Self-pay

## 2022-03-01 MED ORDER — OZEMPIC (0.25 OR 0.5 MG/DOSE) 2 MG/3ML ~~LOC~~ SOPN
0.5000 mg | PEN_INJECTOR | SUBCUTANEOUS | 2 refills | Status: DC
  Filled 2022-03-01 – 2022-03-07 (×3): qty 3, 28d supply, fill #0
  Filled 2022-05-17: qty 3, 28d supply, fill #1
  Filled 2022-05-18: qty 3, 30d supply, fill #1
  Filled 2022-05-19 (×2): qty 3, 28d supply, fill #1
  Filled 2022-06-10: qty 3, 28d supply, fill #2

## 2022-03-03 ENCOUNTER — Other Ambulatory Visit: Payer: Self-pay

## 2022-03-03 ENCOUNTER — Other Ambulatory Visit: Payer: Self-pay | Admitting: Physician Assistant

## 2022-03-03 DIAGNOSIS — J0100 Acute maxillary sinusitis, unspecified: Secondary | ICD-10-CM

## 2022-03-03 MED ORDER — HYDROCODONE-ACETAMINOPHEN 10-325 MG PO TABS
1.0000 | ORAL_TABLET | ORAL | 0 refills | Status: DC | PRN
  Filled 2022-03-05: qty 150, 25d supply, fill #0

## 2022-03-05 ENCOUNTER — Other Ambulatory Visit: Payer: Self-pay

## 2022-03-07 ENCOUNTER — Other Ambulatory Visit: Payer: Self-pay

## 2022-03-15 ENCOUNTER — Other Ambulatory Visit (HOSPITAL_COMMUNITY): Payer: Self-pay

## 2022-03-18 ENCOUNTER — Other Ambulatory Visit: Payer: Self-pay

## 2022-03-23 ENCOUNTER — Telehealth: Payer: PPO | Admitting: Nurse Practitioner

## 2022-03-23 DIAGNOSIS — J0110 Acute frontal sinusitis, unspecified: Secondary | ICD-10-CM

## 2022-03-23 MED ORDER — DOXYCYCLINE HYCLATE 100 MG PO TABS: 100.0000 mg | ORAL_TABLET | Freq: Two times a day (BID) | ORAL | 0 refills | Status: AC

## 2022-03-23 NOTE — Progress Notes (Signed)
E-Visit for Sinus Problems  We are sorry that you are not feeling well.  Here is how we plan to help!  Based on what you have shared with me it looks like you have sinusitis.  Sinusitis is inflammation and infection in the sinus cavities of the head.  Based on your presentation I believe you most likely have Acute Bacterial Sinusitis.  This is an infection caused by bacteria and is treated with antibiotics. I have prescribed Doxycycline '100mg'$  by mouth twice a day for 10 days. You may use an oral decongestant such as Mucinex D or if you have glaucoma or high blood pressure use plain Mucinex. Saline nasal spray help and can safely be used as often as needed for congestion.  If you develop worsening sinus pain, fever or notice severe headache and vision changes, or if symptoms are not better after completion of antibiotic, please schedule an appointment with a health care provider.    Sinus infections are not as easily transmitted as other respiratory infection, however we still recommend that you avoid close contact with loved ones, especially the very young and elderly.  Remember to wash your hands thoroughly throughout the day as this is the number one way to prevent the spread of infection!  Home Care: Only take medications as instructed by your medical team. Complete the entire course of an antibiotic. Do not take these medications with alcohol. A steam or ultrasonic humidifier can help congestion.  You can place a towel over your head and breathe in the steam from hot water coming from a faucet. Avoid close contacts especially the very young and the elderly. Cover your mouth when you cough or sneeze. Always remember to wash your hands.  Get Help Right Away If: You develop worsening fever or sinus pain. You develop a severe head ache or visual changes. Your symptoms persist after you have completed your treatment plan.  Make sure you Understand these instructions. Will watch your  condition. Will get help right away if you are not doing well or get worse.  Thank you for choosing an e-visit.  Your e-visit answers were reviewed by a board certified advanced clinical practitioner to complete your personal care plan. Depending upon the condition, your plan could have included both over the counter or prescription medications.  Please review your pharmacy choice. Make sure the pharmacy is open so you can pick up prescription now. If there is a problem, you may contact your provider through [employer] and have the prescription routed to another pharmacy.  Your safety is important to [place]. If you have drug allergies check your prescription carefully.   For the next 24 hours you can use MyChart to ask questions about today's visit, request a non-urgent call back, or ask for a work or school excuse. You will get an email in the next two days asking about your experience. I hope that your e-visit has been valuable and will speed your recovery.  Meds ordered this encounter  Medications   doxycycline (VIBRA-TABS) 100 MG tablet    Sig: Take 1 tablet (100 mg total) by mouth 2 (two) times daily for 10 days.    Dispense:  20 tablet    Refill:  0    I spent approximately 5 minutes reviewing the patient's history, current symptoms and coordinating their care today.

## 2022-03-30 ENCOUNTER — Other Ambulatory Visit: Payer: Self-pay

## 2022-04-02 ENCOUNTER — Other Ambulatory Visit (HOSPITAL_BASED_OUTPATIENT_CLINIC_OR_DEPARTMENT_OTHER): Payer: Self-pay

## 2022-04-02 ENCOUNTER — Other Ambulatory Visit: Payer: Self-pay

## 2022-04-05 ENCOUNTER — Ambulatory Visit: Payer: PPO | Admitting: Dermatology

## 2022-04-05 VITALS — BP 138/74 | HR 77

## 2022-04-05 DIAGNOSIS — R21 Rash and other nonspecific skin eruption: Secondary | ICD-10-CM | POA: Diagnosis not present

## 2022-04-05 MED ORDER — VALACYCLOVIR HCL 1 G PO TABS: 1000.0000 mg | ORAL_TABLET | Freq: Three times a day (TID) | ORAL | 0 refills | Status: AC

## 2022-04-05 NOTE — Progress Notes (Signed)
   Follow-Up Visit   Subjective  Bethany Scott is a 67 y.o. female who presents for the following: blisters (Back, started over the weekend, burn, painful). Used heating pad on low last night.   The following portions of the chart were reviewed this encounter and updated as appropriate:       Review of Systems:  No other skin or systemic complaints except as noted in HPI or Assessment and Plan.  Objective  Well appearing patient in no apparent distress; mood and affect are within normal limits.  A focused examination was performed including back. Relevant physical exam findings are noted in the Assessment and Plan.  Left Lower Back Pink patches with overlying bulla formation, R spinal mid lower back    Assessment & Plan  Rash Left Lower Back  Burn from heating pad vs less likely Shingles  Recommend mupirocin oint and cover Avoid heating pad Start Valtrex 1g tid x 7 days.  Obtained VZV PCR test    valACYclovir (VALTREX) 1000 MG tablet - Left Lower Back Take 1 tablet (1,000 mg total) by mouth 3 (three) times daily for 7 days.  Varicella-zoster by PCR - Left Lower Back   Return if symptoms worsen or fail to improve.  I, Othelia Pulling, RMA, am acting as scribe for Brendolyn Patty, MD .  Documentation: I have reviewed the above documentation for accuracy and completeness, and I agree with the above.  Brendolyn Patty MD

## 2022-04-05 NOTE — Patient Instructions (Signed)
Due to recent changes in healthcare laws, you may see results of your pathology and/or laboratory studies on MyChart before the doctors have had a chance to review them. We understand that in some cases there may be results that are confusing or concerning to you. Please understand that not all results are received at the same time and often the doctors may need to interpret multiple results in order to provide you with the best plan of care or course of treatment. Therefore, we ask that you please give us 2 business days to thoroughly review all your results before contacting the office for clarification. Should we see a critical lab result, you will be contacted sooner.   If You Need Anything After Your Visit  If you have any questions or concerns for your doctor, please call our main line at [phone] and press option 4 to reach your doctor's medical assistant. If no one answers, please leave a voicemail as directed and we will return your call as soon as possible. Messages left after 4 pm will be answered the following business day.   You may also send us a message via MyChart. We typically respond to MyChart messages within 1-2 business days.  For prescription refills, please ask your pharmacy to contact our office. Our fax number is [phone].  If you have an urgent issue when the clinic is closed that cannot wait until the next business day, you can page your doctor at the number below.    Please note that while we do our best to be available for urgent issues outside of office hours, we are not available 24/7.   If you have an urgent issue and are unable to reach us, you may choose to seek medical care at your doctor's office, retail clinic, urgent care center, or emergency room.  If you have a medical emergency, please immediately call 911 or go to the emergency department.  Pager Numbers  - Dr. Kowalski: [phone]  - Dr. Moye: [phone]  - Dr. Stewart:  [phone]  In the event of inclement weather, please call our main line at [phone] for an update on the status of any delays or closures.  Dermatology Medication Tips: Please keep the boxes that topical medications come in in order to help keep track of the instructions about where and how to use these. Pharmacies typically print the medication instructions only on the boxes and not directly on the medication tubes.   If your medication is too expensive, please contact our office at [phone] option 4 or send us a message through MyChart.   We are unable to tell what your co-pay for medications will be in advance as this is different depending on your insurance coverage. However, we may be able to find a substitute medication at lower cost or fill out paperwork to get insurance to cover a needed medication.   If a prior authorization is required to get your medication covered by your insurance company, please allow us 1-2 business days to complete this process.  Drug prices often vary depending on where the prescription is filled and some pharmacies may offer cheaper prices.  The website www.goodrx.com contains coupons for medications through different pharmacies. The prices here do not account for what the cost may be with help from insurance (it may be cheaper with your insurance), but the website can give you the price if you did not use any insurance.  - You can print the associated coupon and take it with   your prescription to the pharmacy.  - You may also stop by our office during regular business hours and pick up a GoodRx coupon card.  - If you need your prescription sent electronically to a different pharmacy, notify our office through [facility] [place] MyChart or by phone at [phone] option 4.     Si Usted Necesita Algo Despus de Su Visita  Tambin puede enviarnos un mensaje a travs de MyChart. Por lo general respondemos a los mensajes de MyChart en el transcurso de 1 a 2  das hbiles.  Para renovar recetas, por favor pida a su farmacia que se ponga en contacto con nuestra oficina. Nuestro nmero de fax es el [phone].  Si tiene un asunto urgente cuando la clnica est cerrada y que no puede esperar hasta el siguiente da hbil, puede llamar/localizar a su doctor(a) al nmero que aparece a continuacin.   Por favor, tenga en cuenta que aunque hacemos todo lo posible para estar disponibles para asuntos urgentes fuera del horario de oficina, no estamos disponibles las 24 horas del da, los 7 das de la semana.   Si tiene un problema urgente y no puede comunicarse con nosotros, puede optar por buscar atencin mdica  en el consultorio de su doctor(a), en una clnica privada, en un centro de atencin urgente o en una sala de emergencias.  Si tiene una emergencia mdica, por favor llame inmediatamente al 911 o vaya a la sala de emergencias.  Nmeros de bper  - Dr. Kowalski: [phone]  - Dra. Moye: [phone]  - Dra. Stewart: [phone]  En caso de inclemencias del tiempo, por favor llame a nuestra lnea principal al [phone] para una actualizacin sobre el estado de cualquier retraso o cierre.  Consejos para la medicacin en dermatologa: Por favor, guarde las cajas en las que vienen los medicamentos de uso tpico para ayudarle a seguir las instrucciones sobre dnde y cmo usarlos. Las farmacias generalmente imprimen las instrucciones del medicamento slo en las cajas y no directamente en los tubos del medicamento.   Si su medicamento es muy caro, por favor, pngase en contacto con nuestra oficina llamando al [phone] y presione la opcin 4 o envenos un mensaje a travs de MyChart.   No podemos decirle cul ser su copago por los medicamentos por adelantado ya que esto es diferente dependiendo de la cobertura de su seguro. Sin embargo, es posible que podamos encontrar un medicamento sustituto a menor costo o llenar un formulario para que el  seguro cubra el medicamento que se considera necesario.   Si se requiere una autorizacin previa para que su compaa de seguros cubra su medicamento, por favor permtanos de 1 a 2 das hbiles para completar este proceso.  Los precios de los medicamentos varan con frecuencia dependiendo del lugar de dnde se surte la receta y alguna farmacias pueden ofrecer precios ms baratos.  El sitio web www.goodrx.com tiene cupones para medicamentos de diferentes farmacias. Los precios aqu no tienen en cuenta lo que podra costar con la ayuda del seguro (puede ser ms barato con su seguro), pero el sitio web puede darle el precio si no utiliz ningn seguro.  - Puede imprimir el cupn correspondiente y llevarlo con su receta a la farmacia.  - Tambin puede pasar por nuestra oficina durante el horario de atencin regular y recoger una tarjeta de cupones de GoodRx.  - Si necesita que su receta se enve electrnicamente a una farmacia diferente, informe a nuestra oficina a travs de MyChart de [place]   o por telfono llamando al [phone] y presione la opcin 4.  

## 2022-04-08 LAB — VARICELLA-ZOSTER BY PCR: Varicella-Zoster, PCR: NEGATIVE

## 2022-04-19 ENCOUNTER — Other Ambulatory Visit: Payer: Self-pay

## 2022-04-19 ENCOUNTER — Ambulatory Visit: Payer: PPO | Admitting: Urology

## 2022-04-19 ENCOUNTER — Encounter: Payer: Self-pay | Admitting: Urology

## 2022-04-19 VITALS — BP 137/85 | HR 81 | Ht 67.0 in | Wt 182.0 lb

## 2022-04-19 DIAGNOSIS — N3946 Mixed incontinence: Secondary | ICD-10-CM | POA: Diagnosis not present

## 2022-04-19 LAB — URINALYSIS, COMPLETE
Bilirubin, UA: NEGATIVE
Glucose, UA: NEGATIVE
Ketones, UA: NEGATIVE
Leukocytes,UA: NEGATIVE
Nitrite, UA: NEGATIVE
Protein,UA: NEGATIVE
Specific Gravity, UA: 1.01 (ref 1.005–1.030)
Urobilinogen, Ur: 0.2 mg/dL (ref 0.2–1.0)
pH, UA: 5.5 (ref 5.0–7.5)

## 2022-04-19 LAB — MICROSCOPIC EXAMINATION

## 2022-04-19 MED ORDER — MIRABEGRON ER 50 MG PO TB24
50.0000 mg | ORAL_TABLET | Freq: Every day | ORAL | 3 refills | Status: DC
  Filled 2022-04-19: qty 90, 90d supply, fill #0
  Filled 2022-07-27 – 2022-09-21 (×3): qty 90, 90d supply, fill #1

## 2022-04-19 NOTE — Progress Notes (Signed)
04/19/2022 9:15 AM   Bethany Scott May 11, 1955 [id number]  Referring provider: [name], MD [place],  [place] [address]  Chief Complaint  Patient presents with   Follow-up    57mth follow-up   Urinary Incontinence    HPI: I was consulted to assist the patient's incontinence.  She sometimes leaks with coughing sneezing.  She can have urge incontinence of her bladder is too full.  She changes her underwear designed for incontinence once or twice a day.   She voids every 1 hour to 90 minutes and gets up once or twice a night.  Her flow was good but she has to concentrate.  She stops and starts but feels empty   She says she gets more than 12 bladder infections a year.  She has burning and urgency.  She had Pyridium at home and was taking Macrobid after intercourse.  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   She has had neck surgery and hysterectomy.   She is currently doing physical therapy for incontinence which helping   She cannot take cephalosporins and penicillin due to anaphylaxis.  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  She thinks she can take sulfa but they do not want her to take Macrodantin for unknown reason     Patient has mild mixed incontinence.  I am not convinced she is having as many urinary tract infections that she says she is.  She has a lot of issue with medication and Amoxil put her on prophylaxis.  I am not can order urodynamics I thought was reasonable to start her on Myrbetriq 50 mg samples and prescription and continue with physical therapy and call if culture positive.  I ordered a renal ultrasound and call if abnormal.    Today Frequency stable.  Last culture negative.  Ultrasound  normal. Incontinence dramatically better.  No infections.  This is durability in 4 months  Today Frequency stable.  Urge incontinence dramatically better.  Myrbetriq and physical therapy worked great.  No infections    PMH: Past Medical History:  Diagnosis Date   Allergy    Anemia    Anxiety    Arthritis    OA Knee; non-specific autoimmune process followed by [facility]/Rheumatology.   Asthma    Blood transfusion without reported diagnosis YRS AGO   COVID-19 03/2018   Depression    Gastric ulcer, unspecified as acute or chronic, without mention of hemorrhage, perforation, or obstruction YRS AGO   GERD (gastroesophageal reflux disease)    Hyperlipidemia    Hypertension    Methemoglobinemia    Migraine    hx of migraines   Obesity, unspecified    Other chronic cystitis    Personal history of colonic polyps    Pneumonia    PONV (postoperative nausea and vomiting)    NAUSEA, SCOPOLAMINE PATCH HELPED WITH LAST SURGERIES   Pre-diabetes     Surgical History: Past Surgical History:  Procedure Laterality Date   ABDOMINAL HYSTERECTOMY  01/25/1994   uterine fibroids; DUB; cervical dysplasia; ovaries resected two years later.     APPENDECTOMY     BILATERAL OOPHORECTOMY  2001   BREAST BIOPSY Left 10/08/2021   10:00 ribbon, [place] bx, path pending  CESAREAN SECTION     x 2   COLON SURGERY  YRS AGO   SIGMOIDECTOMY   Colonoscopy  09/08/2012   diverticulosis, hemorrhoids.  Bethany Scott.  Symptoms:  anemia, hemoccult +.   ESOPHAGOGASTRODUODENOSCOPY  09/08/2012   +HH.  Bethany Scott.  Symptoms: anemia, hemoccult +.   HIP ARTHROSCOPY Right    LAPAROSCOPIC GASTRIC SLEEVE RESECTION N/A 03/01/2016   Procedure: LAPAROSCOPIC GASTRIC SLEEVE RESECTION, UPPER ENDO;  Surgeon: [name], MD;  Location: [facility];  Service: General;  Laterality: N/A;   LAPAROSCOPIC LYSIS OF ADHESIONS N/A 02/10/2016   Procedure: LAPAROSCOPIC LYSIS OF ADHESIONS;  Surgeon: [name], MD;  Location: [facility];  Service:  General;  Laterality: N/A;   LAPAROSCOPY N/A 02/10/2016   Procedure: LAPAROSCOPY DIAGNOSTIC;  Surgeon: [name], MD;  Location: [facility];  Service: General;  Laterality: N/A;   NASAL SEPTUM SURGERY     NECK SURGERY     Rheumatology Consult  11/26/2011   multiple arthralgias. Autoimmune labs negative.  Rx for Plaquenil for non-specific autoimmune process.  [name].   SHOULDER ARTHROSCOPY WITH OPEN ROTATOR CUFF REPAIR AND DISTAL CLAVICLE ACROMINECTOMY Right 01/13/2021   Procedure: SHOULDER ARTHROSCOPY WITH OPEN ROTATOR CUFF REPAIR AND DISTAL CLAVICLE ACROMINECTOMY;  Surgeon: Thornton Park, MD;  Location: [facility];  Service: Orthopedics;  Laterality: Right;   SHOULDER ARTHROSCOPY WITH SUBACROMIAL DECOMPRESSION Right 01/13/2021   Procedure: SHOULDER ARTHROSCOPY WITH SUBACROMIAL DECOMPRESSION, BICEPS TENOTOMY;  Surgeon: Thornton Park, MD;  Location: [facility];  Service: Orthopedics;  Laterality: Right;   SIGMOID RESECTION / RECTOPEXY     SPINE SURGERY     TONSILLECTOMY AND ADENOIDECTOMY     TOTAL HIP ARTHROPLASTY Right 09/27/2017   Procedure: TOTAL HIP ARTHROPLASTY ANTERIOR APPROACH;  Surgeon: [name], MD;  Location: [facility];  Service: Orthopedics;  Laterality: Right;   TOTAL HIP ARTHROPLASTY Left 12/27/2017   Procedure: TOTAL HIP ARTHROPLASTY ANTERIOR APPROACH;  Surgeon: [name], MD;  Location: [facility];  Service: Orthopedics;  Laterality: Left;    Home Medications:  Allergies as of 04/19/2022       Reactions   Cephalexin Anaphylaxis   Cephalosporins Anaphylaxis, Other (See Comments)   Macrobid [nitrofurantoin Macrocrystal] Other (See Comments)   Causes methemoglobinemia   Penicillins Anaphylaxis, Other (See Comments)   Has patient had a PCN reaction causing immediate rash, facial/tongue/throat swelling, SOB or lightheadedness with hypotension: Yes Has patient had a PCN reaction causing severe rash involving mucus membranes or skin necrosis: No Has patient had a  PCN reaction that required hospitalization Yes Has patient had a PCN reaction occurring within the last 10 years: No If all of the above answers are "NO", then may proceed with Cephalosporin use.   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 If you have any questions, ask your nurse or doctor.          STOP taking these medications    Arexvy 120 MCG/0.5ML injection Generic drug: RSV vaccine recomb adjuvanted Stopped by: Reece Packer, MD   lidocaine 3 % Crea cream Commonly known as: LINDAMANTLE Stopped by: Reece Packer, MD   tolterodine 4 MG 24 hr capsule Commonly known as: DETROL LA Stopped by: Reece Packer, MD       TAKE these medications  Azelastine-Fluticasone 137-50 MCG/ACT Susp Place 2 sprays into the nose 2 (two) times a day.   busPIRone 10 MG tablet Commonly known as: BUSPAR Take 1 tablet (10 mg total) by mouth 3 (three) times daily   DULoxetine 60 MG capsule Commonly known as: CYMBALTA Take 1 capsule (60 mg total) by mouth 2 (two) times daily   estradiol 0.1 MG/GM vaginal cream Commonly known as: ESTRACE Place 2 g vaginally twice a week   gabapentin 300 MG capsule Commonly known as: NEURONTIN Take 1-3 capsules (300-900 mg total) by mouth 3 (three) times daily   HYDROcodone-acetaminophen 10-325 MG tablet Commonly known as: NORCO Take 1 tablet by mouth every 4 (four) hours as needed for Pain (DNF 07/24/21)   HYDROcodone-acetaminophen 10-325 MG tablet Commonly known as: NORCO Take 1 tablet by mouth every 4 (four) hours as needed for Pain.   HYDROcodone-acetaminophen 10-325 MG tablet Commonly known as: NORCO Take 1 tablet by mouth every 4 (four) hours as needed for pain.   HYDROcodone-acetaminophen 10-325 MG tablet Commonly known as:  NORCO Take 1 tablet by mouth every 4 (four) hours as needed for Pain.   HYDROcodone-acetaminophen 10-325 MG tablet Commonly known as: NORCO Take 1 tablet by mouth every 4 (four) hours as needed for Pain.   hydrocortisone 2.5 % cream Apply topically 2 (two) times daily as needed (Rash). At corners of mouth for up to one week.   ketoconazole 2 % shampoo Commonly known as: NIZORAL apply three times per week, massage into scalp and leave in for 10 minutes before rinsing out   latanoprost 0.005 % ophthalmic solution Commonly known as: XALATAN Place 1 drop into both eyes at bedtime.   levocetirizine 5 MG tablet Commonly known as: XYZAL Take 1 tablet (5 mg total) by mouth every evening.   methocarbamol 750 MG tablet Commonly known as: ROBAXIN Take 750 mg by mouth 3 (three) times daily as needed for muscle spasms.   montelukast 10 MG tablet Commonly known as: SINGULAIR Take 1 tablet (10 mg total) by mouth at bedtime.   mupirocin ointment 2 % Commonly known as: BACTROBAN Apply to affected area twice a day to corners of mouth (Apply 1 Application topically 2 (two) times daily. To corners of mouth)   Myrbetriq 50 MG Tb24 tablet Generic drug: mirabegron ER Take 50 mg by mouth daily.   omeprazole 40 MG capsule Commonly known as: PRILOSEC Take 40 mg by mouth 2 (two) times daily.   Ozempic (0.25 or 0.5 MG/DOSE) 2 MG/3ML Sopn Generic drug: Semaglutide(0.25 or 0.5MG /DOS) Inject 0.5 mg into the skin once a week.   promethazine 25 MG tablet Commonly known as: PHENERGAN Take 1 tablet (25 mg total) by mouth every 8 (eight) hours as needed for nausea.   Symbicort 160-4.5 MCG/ACT inhaler Generic drug: budesonide-formoterol INHALE 2 PUFFS BY MOUTH 2 TIMES DAILY        Allergies:  Allergies  Allergen Reactions   Cephalexin Anaphylaxis   Cephalosporins Anaphylaxis and Other (See Comments)   Macrobid [Nitrofurantoin Macrocrystal] Other (See Comments)    Causes methemoglobinemia    Penicillins Anaphylaxis and Other (See Comments)    Has patient had a PCN reaction causing immediate rash, facial/tongue/throat swelling, SOB or lightheadedness with hypotension: Yes Has patient had a PCN reaction causing severe rash involving mucus membranes or skin necrosis: No Has patient had a PCN reaction that required hospitalization Yes Has patient had a PCN reaction occurring within the last 10 years: No If all of the above answers are "  NO", then may proceed with Cephalosporin use.    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    Family History: Family History  Problem Relation Age of Onset   Bipolar disorder Father    Transient ischemic attack Father    Dementia Father        secondary to head trauma   Hypertension Father    Diabetes Father    CAD Father    Cancer Mother 78       lung   Migraines Mother    Hypothyroidism Mother    Hypertension Sister    Atrial fibrillation Sister    Hypothyroidism Sister    Alcohol abuse Brother    Hypertension Brother    Alcohol abuse Brother    Atrial fibrillation Maternal Grandmother    COPD Maternal Grandmother    Diabetes Maternal Grandmother    Hyperlipidemia Maternal Grandmother    Diabetes Maternal Grandfather    Diabetes Paternal Grandfather    Heart disease Paternal Grandfather    Breast cancer Sister 63    Social History:  reports that she has quit smoking. Her smoking use included cigarettes. She has a 5.00 pack-year smoking history. She has been exposed to tobacco smoke. She has never used smokeless tobacco. She reports current alcohol use. She reports that she does not use drugs.  ROS:                                        Physical Exam: BP 137/85   Pulse 81   Ht 5\' 7"  (1.702 m)   Wt 82.6 kg   BMI 28.51 kg/m   Constitutional:  Alert and oriented, No acute  distress. HEENT: [place] AT, moist mucus membranes.  Trachea midline, no masses.   Laboratory Data: Lab Results  Component Value Date   WBC 7.9 12/29/2021   HGB 14.2 12/29/2021   HCT 43.1 12/29/2021   MCV 91.7 12/29/2021   PLT 363 12/29/2021    Lab Results  Component Value Date   CREATININE 0.67 12/29/2021    No results found for: "PSA"  No results found for: "TESTOSTERONE"  Lab Results  Component Value Date   HGBA1C 4.7 (L) 04/30/2017    Urinalysis    Component Value Date/Time   COLORURINE YELLOW (A) 10/06/2021 1711   APPEARANCEUR CLEAR (A) 10/06/2021 1711   APPEARANCEUR Cloudy (A) 09/21/2021 0922   LABSPEC 1.004 (L) 10/06/2021 1711   PHURINE 6.0 10/06/2021 1711   GLUCOSEU NEGATIVE 10/06/2021 1711   HGBUR SMALL (A) 10/06/2021 1711   BILIRUBINUR NEGATIVE 10/06/2021 1711   BILIRUBINUR Negative 09/21/2021 0922   KETONESUR NEGATIVE 10/06/2021 1711   PROTEINUR NEGATIVE 10/06/2021 1711   UROBILINOGEN >=8.0 (A) 04/05/2018 1633   NITRITE NEGATIVE 10/06/2021 1711   LEUKOCYTESUR NEGATIVE 10/06/2021 1711    Pertinent Imaging:   Assessment & Plan: 90 x 3 sent to pharmacy.  Samples given.  I will see her in 1 year.  Offered samples and donut hole next year if needed  1. Mixed incontinence  - Urinalysis, Complete   No follow-ups on file.  Reece Packer, MD  [facility] [address], [place], [place] [address] [phone]

## 2022-04-22 ENCOUNTER — Other Ambulatory Visit: Payer: Self-pay

## 2022-04-22 MED ORDER — PROMETHAZINE HCL 25 MG PO TABS
25.0000 mg | ORAL_TABLET | Freq: Three times a day (TID) | ORAL | 2 refills | Status: DC | PRN
  Filled 2022-04-22: qty 20, 7d supply, fill #0
  Filled 2022-07-27: qty 20, 7d supply, fill #1
  Filled 2022-09-01: qty 20, 7d supply, fill #2

## 2022-04-22 MED ORDER — OMEPRAZOLE 40 MG PO CPDR
DELAYED_RELEASE_CAPSULE | ORAL | 0 refills | Status: DC
  Filled 2022-04-22: qty 180, 90d supply, fill #0

## 2022-04-27 ENCOUNTER — Other Ambulatory Visit: Payer: Self-pay

## 2022-04-30 ENCOUNTER — Other Ambulatory Visit: Payer: Self-pay

## 2022-04-30 MED ORDER — HYDROCODONE-ACETAMINOPHEN 10-325 MG PO TABS
1.0000 | ORAL_TABLET | ORAL | 0 refills | Status: DC | PRN
  Filled 2022-04-30: qty 150, 25d supply, fill #0

## 2022-05-17 ENCOUNTER — Other Ambulatory Visit: Payer: Self-pay

## 2022-05-18 ENCOUNTER — Other Ambulatory Visit: Payer: Self-pay

## 2022-05-19 ENCOUNTER — Other Ambulatory Visit: Payer: Self-pay

## 2022-05-20 ENCOUNTER — Other Ambulatory Visit: Payer: Self-pay

## 2022-05-27 ENCOUNTER — Other Ambulatory Visit: Payer: Self-pay

## 2022-05-27 MED ORDER — HYDROCODONE-ACETAMINOPHEN 10-325 MG PO TABS
1.0000 | ORAL_TABLET | ORAL | 0 refills | Status: DC | PRN
  Filled 2022-05-27: qty 150, 25d supply, fill #0

## 2022-05-28 ENCOUNTER — Other Ambulatory Visit: Payer: Self-pay

## 2022-06-10 ENCOUNTER — Other Ambulatory Visit: Payer: Self-pay

## 2022-06-10 MED ORDER — GABAPENTIN 300 MG PO CAPS
300.0000 mg | ORAL_CAPSULE | Freq: Three times a day (TID) | ORAL | 2 refills | Status: DC
  Filled 2022-06-10: qty 810, 90d supply, fill #0
  Filled 2022-09-21: qty 810, 90d supply, fill #1
  Filled 2022-12-29 – 2022-12-31 (×2): qty 810, 90d supply, fill #2

## 2022-06-10 MED ORDER — LEVOCETIRIZINE DIHYDROCHLORIDE 5 MG PO TABS
5.0000 mg | ORAL_TABLET | Freq: Every evening | ORAL | 2 refills | Status: DC
  Filled 2022-06-10: qty 90, 90d supply, fill #0
  Filled 2022-09-21 – 2022-12-13 (×2): qty 90, 90d supply, fill #1
  Filled 2023-04-25: qty 90, 90d supply, fill #2

## 2022-06-10 MED ORDER — MONTELUKAST SODIUM 10 MG PO TABS
10.0000 mg | ORAL_TABLET | Freq: Every day | ORAL | 2 refills | Status: AC
  Filled 2022-06-10: qty 90, 90d supply, fill #0
  Filled 2022-07-27 – 2022-10-06 (×4): qty 90, 90d supply, fill #1
  Filled 2023-01-04 – 2023-05-16 (×6): qty 90, 90d supply, fill #2

## 2022-06-11 ENCOUNTER — Other Ambulatory Visit: Payer: Self-pay

## 2022-06-11 MED ORDER — BUSPIRONE HCL 15 MG PO TABS
15.0000 mg | ORAL_TABLET | Freq: Three times a day (TID) | ORAL | 3 refills | Status: AC
  Filled 2022-06-11: qty 270, 90d supply, fill #0
  Filled 2022-09-21 – 2022-12-29 (×2): qty 270, 90d supply, fill #1
  Filled 2022-12-31: qty 132, 44d supply, fill #1
  Filled 2022-12-31: qty 138, 46d supply, fill #1
  Filled 2023-05-16: qty 270, 90d supply, fill #2

## 2022-06-16 ENCOUNTER — Other Ambulatory Visit: Payer: Self-pay

## 2022-06-16 ENCOUNTER — Other Ambulatory Visit (HOSPITAL_COMMUNITY): Payer: Self-pay

## 2022-06-16 MED ORDER — DULOXETINE HCL 60 MG PO CPEP
60.0000 mg | ORAL_CAPSULE | Freq: Two times a day (BID) | ORAL | 4 refills | Status: AC
  Filled 2022-06-16: qty 180, 90d supply, fill #0
  Filled 2022-09-21: qty 180, 90d supply, fill #1
  Filled 2022-12-29 – 2022-12-31 (×2): qty 180, 90d supply, fill #2
  Filled 2023-04-12: qty 180, 90d supply, fill #3

## 2022-06-18 ENCOUNTER — Other Ambulatory Visit: Payer: Self-pay

## 2022-06-18 MED ORDER — ROSUVASTATIN CALCIUM 5 MG PO TABS
5.0000 mg | ORAL_TABLET | Freq: Every day | ORAL | 3 refills | Status: DC
  Filled 2022-06-18: qty 90, 90d supply, fill #0
  Filled 2022-07-27 – 2022-09-21 (×2): qty 90, 90d supply, fill #1
  Filled 2022-12-29 – 2022-12-31 (×2): qty 90, 90d supply, fill #2
  Filled 2023-04-29: qty 90, 90d supply, fill #3

## 2022-06-25 ENCOUNTER — Other Ambulatory Visit: Payer: Self-pay

## 2022-06-25 MED ORDER — HYDROCODONE-ACETAMINOPHEN 10-325 MG PO TABS
1.0000 | ORAL_TABLET | ORAL | 0 refills | Status: DC | PRN
  Filled 2022-06-25: qty 150, 25d supply, fill #0

## 2022-06-27 ENCOUNTER — Other Ambulatory Visit: Payer: Self-pay

## 2022-06-27 MED ORDER — SEMAGLUTIDE(0.25 OR 0.5MG/DOS) 2 MG/3ML ~~LOC~~ SOPN
0.5000 mg | PEN_INJECTOR | SUBCUTANEOUS | 5 refills | Status: DC
  Filled 2022-06-27: qty 3, 28d supply, fill #0
  Filled 2022-08-05: qty 3, 28d supply, fill #1
  Filled 2022-09-01: qty 3, 28d supply, fill #2
  Filled 2022-09-30: qty 3, 28d supply, fill #3
  Filled 2022-10-28: qty 3, 28d supply, fill #4
  Filled 2022-11-29: qty 3, 28d supply, fill #5

## 2022-06-29 ENCOUNTER — Other Ambulatory Visit: Payer: Self-pay

## 2022-06-29 ENCOUNTER — Ambulatory Visit: Payer: PPO | Admitting: Dermatology

## 2022-06-29 VITALS — BP 129/68 | HR 81

## 2022-06-29 DIAGNOSIS — Z8639 Personal history of other endocrine, nutritional and metabolic disease: Secondary | ICD-10-CM

## 2022-06-29 DIAGNOSIS — L719 Rosacea, unspecified: Secondary | ICD-10-CM | POA: Diagnosis not present

## 2022-06-29 DIAGNOSIS — L650 Telogen effluvium: Secondary | ICD-10-CM | POA: Diagnosis not present

## 2022-06-29 DIAGNOSIS — L219 Seborrheic dermatitis, unspecified: Secondary | ICD-10-CM

## 2022-06-29 DIAGNOSIS — L649 Androgenic alopecia, unspecified: Secondary | ICD-10-CM

## 2022-06-29 DIAGNOSIS — D749 Methemoglobinemia, unspecified: Secondary | ICD-10-CM

## 2022-06-29 MED ORDER — DUTASTERIDE 0.5 MG PO CAPS
0.5000 mg | ORAL_CAPSULE | Freq: Every day | ORAL | 6 refills | Status: DC
  Filled 2022-06-29: qty 30, 30d supply, fill #0
  Filled 2022-07-27: qty 30, 30d supply, fill #1
  Filled 2022-08-02 – 2022-09-02 (×2): qty 30, 30d supply, fill #2
  Filled 2022-10-06 (×2): qty 30, 30d supply, fill #3
  Filled 2022-11-08: qty 30, 30d supply, fill #4
  Filled 2022-12-09: qty 30, 30d supply, fill #5

## 2022-06-29 MED ORDER — KETOCONAZOLE 2 % EX SHAM
1.0000 | MEDICATED_SHAMPOO | CUTANEOUS | 11 refills | Status: DC
Start: 2022-06-30 — End: 2023-03-21
  Filled 2022-06-29: qty 120, 56d supply, fill #0
  Filled 2022-07-21 – 2022-07-23 (×2): qty 120, 30d supply, fill #1
  Filled 2022-07-27: qty 120, 28d supply, fill #1
  Filled 2022-09-01: qty 120, 28d supply, fill #2
  Filled 2022-12-29 – 2022-12-31 (×2): qty 120, 28d supply, fill #3
  Filled 2023-01-08: qty 120, 28d supply, fill #4

## 2022-06-29 MED ORDER — MINOXIDIL 2.5 MG PO TABS
2.5000 mg | ORAL_TABLET | Freq: Every day | ORAL | 6 refills | Status: DC
  Filled 2022-06-29: qty 30, 30d supply, fill #0

## 2022-06-29 NOTE — Patient Instructions (Addendum)
Dont recommend bioten supplements - due for the potential of hiding heart attacks.      Telogen Effluvium Counseling Telogen effluvium is a benign, self-limited condition causing increased hair shedding usually for several months. It does not progress to baldness, and the hair eventually grows back on its own. It can be triggered by recent illness, recent surgery, thyroid disease, low iron stores, vitamin D deficiency, fad diets or rapid weight loss, hormonal changes such as pregnancy or birth control pills, and some medication. Usually the hair loss starts 2-3 months after the illness or health change. Rarely, it can continue for longer than a year. Treatments options may include oral or topical Minoxidil; Red Light scalp treatments; Biotin 2.5 mg daily and other options.   Start minoxidil 2.5 mg tab - take 1/2 tab by mouth daily   Start dutasteride 0.5 mg capsule - take 1 capsule by mouth daily     Doses of minoxidil for hair loss are considered 'low dose'. This is because the doses used for hair loss are much lower than the doses which are used for conditions such as high blood pressure (hypertension). The doses used for hypertension are 10-40mg  per day.  Side effects are uncommon at the low doses (up to 2.5 mg/day) used to treat hair loss. Potential side effects, more commonly seen at higher doses, include: Increase in hair growth (hypertrichosis) elsewhere on face and body Temporary hair shedding upon starting medication which may last up to 4 weeks Ankle swelling, fluid retention, rapid weight gain more than 5 pounds Low blood pressure and feeling lightheaded or dizzy when standing up quickly Fast or irregular heartbeat Headaches     Due to recent changes in healthcare laws, you may see results of your pathology and/or laboratory studies on MyChart before the doctors have had a chance to review them. We understand that in some cases there may be results that are confusing or  concerning to you. Please understand that not all results are received at the same time and often the doctors may need to interpret multiple results in order to provide you with the best plan of care or course of treatment. Therefore, we ask that you please give [place] 2 business days to thoroughly review all your results before contacting the office for clarification. Should we see a critical lab result, you will be contacted sooner.   If You Need Anything After Your Visit  If you have any questions or concerns for your doctor, please call our main line at [phone] and press option 4 to reach your doctor's medical assistant. If no one answers, please leave a voicemail as directed and we will return your call as soon as possible. Messages left after 4 pm will be answered the following business day.   You may also send [place] a message via MyChart. We typically respond to MyChart messages within 1-2 business days.  For prescription refills, please ask your pharmacy to contact our office. Our fax number is [phone].  If you have an urgent issue when the clinic is closed that cannot wait until the next business day, you can page your doctor at the number below.    Please note that while we do our best to be available for urgent issues outside of office hours, we are not available 24/7.   If you have an urgent issue and are unable to reach [place], you may choose to seek medical care at your doctor's office, retail clinic, urgent care center, or emergency  room.  If you have a medical emergency, please immediately call 911 or go to the emergency department.  Pager Numbers  - Dr. [name]: [phone]  - Dr. Neale Burly: [phone]  - Dr. Roseanne Reno: [phone]  In the event of inclement weather, please call our main line at [phone] for an update on the status of any delays or closures.  Dermatology Medication Tips: Please keep the boxes that topical medications come in in order to help keep track  of the instructions about where and how to use these. Pharmacies typically print the medication instructions only on the boxes and not directly on the medication tubes.   If your medication is too expensive, please contact our office at [phone] option 4 or send [place] a message through MyChart.   We are unable to tell what your co-pay for medications will be in advance as this is different depending on your insurance coverage. However, we may be able to find a substitute medication at lower cost or fill out paperwork to get insurance to cover a needed medication.   If a prior authorization is required to get your medication covered by your insurance company, please allow [place] 1-2 business days to complete this process.  Drug prices often vary depending on where the prescription is filled and some pharmacies may offer cheaper prices.  The website www.goodrx.com contains coupons for medications through different pharmacies. The prices here do not account for what the cost may be with help from insurance (it may be cheaper with your insurance), but the website can give you the price if you did not use any insurance.  - You can print the associated coupon and take it with your prescription to the pharmacy.  - You may also stop by our office during regular business hours and pick up a GoodRx coupon card.  - If you need your prescription sent electronically to a different pharmacy, notify our office through [facility] or by phone at [phone] option 4.     Si Usted Necesita Algo Despus de Su Visita  Tambin puede enviarnos un mensaje a travs de Clinical cytogeneticist. Por lo general respondemos a los mensajes de MyChart en el transcurso de 1 a 2 das hbiles.  Para renovar recetas, por favor pida a su farmacia que se ponga en contacto con nuestra oficina. Annie Sable de fax es [place] [phone].  Si tiene un asunto urgente cuando la clnica est cerrada y que no puede esperar hasta el siguiente da  hbil, puede llamar/localizar a su doctor(a) al nmero que aparece a continuacin.   Por favor, tenga en cuenta que aunque hacemos todo lo posible para estar disponibles para asuntos urgentes fuera del horario de [place], no estamos disponibles las 24 horas del da, los 7 [address]  [address] de la [place].   Si tiene un problema urgente y no puede comunicarse con nosotros, puede optar por buscar atencin mdica  en el consultorio de su doctor(a), en una clnica privada, en un centro de atencin urgente o en una sala de emergencias.  Si tiene Engineer, drilling, por favor llame inmediatamente al 911 o vaya a la sala de emergencias.  Nmeros de bper  - Dr. [name]: [phone]  - Dra. Moye: [phone]  - Dra. Roseanne Reno: [phone]  En caso de inclemencias del [place], por favor llame a Lacy Duverney principal al [phone] para una actualizacin sobre el [place] de cualquier retraso o cierre.  Consejos para la medicacin en dermatologa: Por favor, guarde las cajas en las que vienen los  medicamentos de uso tpico para ayudarle a seguir las [employer] dnde y cmo usarlos. Las farmacias generalmente imprimen las instrucciones del medicamento slo en las cajas y no directamente en los tubos del [place].   Si su medicamento es muy caro, por favor, pngase en contacto con Rolm Gala llamando al [phone] y presione la opcin 4 o envenos un mensaje a travs de Clinical cytogeneticist.   No podemos decirle cul ser su copago por los medicamentos por adelantado ya que esto es diferente dependiendo de la cobertura de su seguro. Sin embargo, es posible que podamos encontrar un medicamento sustituto a Audiological scientist un formulario para que el seguro cubra el medicamento que se considera necesario.   Si se requiere una autorizacin previa para que su compaa de seguros [place] su medicamento, por favor permtanos de 1 a 2 das hbiles para completar [address]  Los precios de los medicamentos  varan con frecuencia dependiendo del Environmental consultant de dnde se surte la receta y alguna farmacias pueden ofrecer precios ms baratos.  El sitio web www.goodrx.com tiene cupones para medicamentos de Health and safety inspector. Los precios aqu no tienen en cuenta lo que podra costar con la ayuda del seguro (puede ser ms barato con su seguro), pero el sitio web puede darle el precio si no utiliz Tourist information centre manager.  - Puede imprimir el cupn correspondiente y llevarlo con su receta a la farmacia.  - Tambin puede pasar por nuestra oficina durante el horario de atencin regular y Education officer, museum una tarjeta de cupones de GoodRx.  - Si necesita que su receta se enve electrnicamente a una farmacia diferente, informe a nuestra oficina a travs de MyChart de [place] o por telfono llamando al [phone] y presione la opcin 4.

## 2022-06-29 NOTE — Progress Notes (Unsigned)
Follow-Up Visit   Subjective  Bethany Scott is a 67 y.o. female who presents for the following: Patient here today concerning hair loss she has noticed for about 6 months. She has been taking several otc supplements to help for about 3 months now. She would also like to discuss refills of her rosacea cream, intertrigo cream and seb derm shampoo.   Patient reports she has been over a lot of stress in the past year with moving, ending a relationship, and weight loss.   The following portions of the chart were reviewed this encounter and updated as appropriate: medications, allergies, medical history  Review of Systems:  No other skin or systemic complaints except as noted in HPI or Assessment and Plan.  Objective  Well appearing patient in no apparent distress; mood and affect are within normal limits.   A focused examination was performed of the following areas: Scalp   Relevant exam findings are noted in the Assessment and Plan.             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  Telogen effluvium is a benign, self-limited condition causing increased hair shedding usually for several months. It does not progress to baldness, and the hair eventually grows back on its own. It can be triggered by recent illness, recent surgery, thyroid disease, low iron stores, vitamin D deficiency, fad diets or rapid weight loss, hormonal changes such as pregnancy or birth control pills, and some medication. Usually the hair loss starts 2-3 months after the illness or health change. Rarely, it can continue for longer than a year. Treatments options may include oral or topical Minoxidil; Red Light scalp treatments; Biotin 2.5 mg daily and other options.  Treatment Plan: No history of heart problems  Start minoxidil 2.5 mg tab take 1 /2 tab by mouth daily  Will also order vitamin D level and ferritin to ensure these are not low.  Reviewed TSH, methemoglobin, and anti-thyroglobulin labs.  Doses of minoxidil for hair loss are considered 'low dose'. This is because the doses used for hair loss are much lower than the doses which are used for conditions such as high blood pressure (hypertension). The doses used for hypertension are 10-40mg  per day.  Side effects are uncommon at the low doses (up to 2.5 mg/day) used to treat hair loss. Potential side effects, more commonly seen at higher doses, include: Increase in hair growth (hypertrichosis) elsewhere on face and body Temporary hair shedding upon starting medication which may last up to 4 weeks Ankle swelling, fluid retention, rapid weight gain more than 5 pounds Low blood pressure and feeling lightheaded or dizzy when standing up quickly Fast or irregular heartbeat Headaches    ANDROGENETIC ALOPECIA (FEMALE PATTERN HAIR LOSS) Exam: Diffuse thinning of the crown and widening of the midline part with retention of the frontal hairline  Chronic and persistent condition with duration or expected duration over one year. Condition is symptomatic/ bothersome to patient. Not currently at goal.  Female Androgenic Alopecia is a chronic condition related to genetics and/or hormonal changes.  In women androgenetic alopecia is commonly associated with menopause but may occur any time after puberty.  It causes hair thinning primarily on the crown with widening of the part and temporal hairline recession.  Can use OTC Rogaine (minoxidil) 5% solution/foam as directed.  Oral treatments in female patients who have no contraindication may include : - Low dose oral  minoxidil 1.25 - 5mg  daily - Spironolactone 50 - 100mg  bid - Finasteride 2.5 - 5 mg daily Adjunctive therapies include: - Low Level Laser Light Therapy (LLLT) - Platelet-rich plasma injections (PRP) - Hair Transplants or scalp reduction   Treatment Plan: Start minoxidil 1.25 mg daily Start dutasteride 0.5 mg capsule - 1 by  mouth daily   Counseled that dutasteride can decrease libido (sexual drive). Advised it should not be taken by pregnant women or women who could become pregnant. Advised not to donate blood products while taking this medication. Advised if medication is stopped, they may lose the hair the medication has been helping to grow.   ROSACEA Exam Clear at exam  Chronic condition with duration or expected duration over one year. Currently well-controlled.  Rosacea is a chronic progressive skin condition usually affecting the face of adults, causing redness and/or acne bumps. It is treatable but not curable. It sometimes affects the eyes (ocular rosacea) as well. It may respond to topical and/or systemic medication and can flare with stress, sun exposure, alcohol, exercise, topical steroids (including hydrocortisone/cortisone 10) and some foods.  Daily application of broad spectrum spf 30+ sunscreen to face is recommended to reduce flares.  Patient denies grittiness of the eyes  Treatment Plan Continue Skin Medicinals Triple Cream Azelaic Acid: 15% Ivermectin: 1% Metronidazole: 1% bid    SEBORRHEIC DERMATITIS Exam: Clear at exam  Chronic condition with duration or expected duration over one year. Currently well-controlled.  Seborrheic Dermatitis is a chronic persistent rash characterized by pinkness and scaling most commonly of the mid face but also can occur on the scalp (dandruff), ears; mid chest, mid back and groin.  It tends to be exacerbated by stress and cooler weather.  People who have neurologic disease may experience new onset or exacerbation of existing seborrheic dermatitis.  The condition is not curable but treatable and can be controlled.  Treatment Plan: Continue Ketoconazole 2 % shampoo -  apply three times per week, massage into scalp and leave in for 10 minutes before rinsing out    INTERTRIGO Exam Not examined today  Chronic condition with duration or expected duration  over one year. Currently well-controlled.  Intertrigo is a chronic recurrent rash that occurs in skin fold areas that may be associated with friction; heat; moisture; yeast; fungus; and bacteria.  It is exacerbated by increased movement / activity; sweating; and higher atmospheric temperature.  Treatment Plan  Continue Skin Medicinals Intertrigo cream  Iodoquinol: 1% Hydrocortisone: 2.5% Niacinamide: 2% apply twice daily to affected areas as needed for flares  Topical steroids (such as triamcinolone, fluocinolone, fluocinonide, mometasone, clobetasol, halobetasol, betamethasone, hydrocortisone) can cause thinning and lightening of the skin if they are used for too long in the same area. Your physician has selected the right strength medicine for your problem and area affected on the body. Please use your medication only as directed by your physician to prevent side effects.    Return for 4 - 6 month hair follow up with Dr. Roseanne Reno .  I, Asher Muir, CMA, am acting as scribe for Darden Dates, MD.   Documentation: I have reviewed the above documentation for accuracy and completeness, and I agree with the above.  Darden Dates, MD

## 2022-06-30 ENCOUNTER — Other Ambulatory Visit: Payer: Self-pay | Admitting: Dermatology

## 2022-06-30 ENCOUNTER — Inpatient Hospital Stay (HOSPITAL_BASED_OUTPATIENT_CLINIC_OR_DEPARTMENT_OTHER): Payer: PPO | Admitting: Oncology

## 2022-06-30 ENCOUNTER — Encounter: Payer: Self-pay | Admitting: Oncology

## 2022-06-30 ENCOUNTER — Inpatient Hospital Stay: Payer: PPO | Attending: Oncology

## 2022-06-30 ENCOUNTER — Other Ambulatory Visit: Payer: Self-pay

## 2022-06-30 VITALS — BP 134/74 | HR 88 | Temp 98.7°F | Resp 18 | Ht 67.0 in | Wt 177.9 lb

## 2022-06-30 DIAGNOSIS — D591 Autoimmune hemolytic anemia, unspecified: Secondary | ICD-10-CM | POA: Insufficient documentation

## 2022-06-30 DIAGNOSIS — D749 Methemoglobinemia, unspecified: Secondary | ICD-10-CM | POA: Diagnosis present

## 2022-06-30 DIAGNOSIS — Z862 Personal history of diseases of the blood and blood-forming organs and certain disorders involving the immune mechanism: Secondary | ICD-10-CM

## 2022-06-30 LAB — CMP (CANCER CENTER ONLY)
ALT: 13 U/L (ref 0–44)
AST: 16 U/L (ref 15–41)
Albumin: 4.1 g/dL (ref 3.5–5.0)
Alkaline Phosphatase: 54 U/L (ref 38–126)
Anion gap: 10 (ref 5–15)
BUN: 12 mg/dL (ref 8–23)
CO2: 27 mmol/L (ref 22–32)
Calcium: 8.7 mg/dL — ABNORMAL LOW (ref 8.9–10.3)
Chloride: 99 mmol/L (ref 98–111)
Creatinine: 0.58 mg/dL (ref 0.44–1.00)
GFR, Estimated: >60 mL/min (ref >60)
Glucose, Bld: 87 mg/dL (ref 70–99)
Potassium: 3.7 mmol/L (ref 3.5–5.1)
Sodium: 136 mmol/L (ref 135–145)
Total Bilirubin: 0.4 mg/dL (ref 0.3–1.2)
Total Protein: 6.6 g/dL (ref 6.5–8.1)

## 2022-06-30 LAB — CBC WITH DIFFERENTIAL (CANCER CENTER ONLY)
Abs Immature Granulocytes: 0.03 10*3/uL (ref 0.00–0.07)
Basophils Absolute: 0 10*3/uL (ref 0.0–0.1)
Basophils Relative: 0 %
Eosinophils Absolute: 1.3 10*3/uL — ABNORMAL HIGH (ref 0.0–0.5)
Eosinophils Relative: 13 %
HCT: 36.1 % (ref 36.0–46.0)
Hemoglobin: 11.6 g/dL — ABNORMAL LOW (ref 12.0–15.0)
Immature Granulocytes: 0 %
Lymphocytes Relative: 18 %
Lymphs Abs: 1.8 10*3/uL (ref 0.7–4.0)
MCH: 33.2 pg (ref 26.0–34.0)
MCHC: 32.1 g/dL (ref 30.0–36.0)
MCV: 103.4 fL — ABNORMAL HIGH (ref 80.0–100.0)
Monocytes Absolute: 0.6 10*3/uL (ref 0.1–1.0)
Monocytes Relative: 6 %
Neutro Abs: 6.2 10*3/uL (ref 1.7–7.7)
Neutrophils Relative %: 63 %
Platelet Count: 211 10*3/uL (ref 150–400)
RBC: 3.49 MIL/uL — ABNORMAL LOW (ref 3.87–5.11)
RDW: 12.2 % (ref 11.5–15.5)
WBC Count: 9.9 10*3/uL (ref 4.0–10.5)
nRBC: 0 % (ref 0.0–0.2)

## 2022-06-30 LAB — RETICULOCYTES
Immature Retic Fract: 13 % (ref 2.3–15.9)
RBC.: 3.44 MIL/uL — ABNORMAL LOW (ref 3.87–5.11)
Retic Count, Absolute: 114.9 10*3/uL (ref 19.0–186.0)
Retic Ct Pct: 3.3 % — ABNORMAL HIGH (ref 0.4–3.1)

## 2022-06-30 LAB — VITAMIN B12: Vitamin B-12: 364 pg/mL (ref 180–914)

## 2022-06-30 NOTE — Progress Notes (Signed)
Hematology/Oncology Consult note [facility]  [phone] [phone]  Patient Care Team: Ethelda Chick, MD as PCP - General (Family Medicine) Arvil Chaco, Belia Heman, MD (Obstetrics and Gynecology) Creig Hines, MD as Consulting Physician (Oncology)   Name of the patient: Bethany Scott  [id number]  Apr 26, 1955   Date of visit: 06/30/22  Diagnosis- 1.  History of drug-induced hemolytic anemia 2.  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  She also has a history of iron deficiency and folate acid deficiency.  Patient was admitted for bilateral pneumonia in August 2022 at that time she was treated with antibiotics.  At that time she was evaluated for a hemoglobin of 6.6 and underwent a comprehensive work-up which showed normal ferritin of 183 with a normal iron saturation folate level normal at 18.8 B12 levels normal at 494 immunoglobulins were normal haptoglobin was low initially at less than 10 and subsequently in May 2023 it was still low at 16 LDH has been normal.  She has had G6PD levels checked 3 different occasions over the last 1 month and they have been within normal limits.  Coombs test in April 2023 was negative.  PNH profile testing negative.  Her hemoglobin which was 6.6 in August 2022 subsequently recovered to 10.5-11 over the next 6 months.  Hemoglobinopathy evaluation negative. 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 She did not require steroids or other treatment for her hemolytic anemia.    Patient presented to the ER with symptoms of shortness of breath and her lips turning purple in June 2023.  She was given Azo and Macrobid recently for UTI.Patient had a coexemetry panel  performed in the hospital which showed an elevated methemoglobin level of 24.7%.  Carboxyhemoglobin level less than 0.3 and oxygen saturation on that sample was 95.4% patient was given methylene blue with improvement of her cyanosis.  No exposure to well water.  No other recent changes in medications.  Patient then had a second episode of methemoglobinemia that was less severe a few weeks later and patient presented to [facility].  Symptoms resolved with conservative management and did not require methylene blue.  Azo was thought to be the culprit drug    Interval history-patient has not had any recurrent episodes of methemoglobinemia.  She is doing well overall.  His hemoglobin was down to 10.4 last month but has improved again.  She has not required any recent antibiotics.  She is going through a lot of personal stressors and is seeing a therapist  ECOG PS- 1 Pain scale- 0 Opioid associated constipation- no  Review of systems- Review of Systems  Constitutional:  Negative for chills, fever, malaise/fatigue and weight loss.  HENT:  Negative for congestion, ear discharge and nosebleeds.   Eyes:  Negative for blurred vision.  Respiratory:  Negative for cough, hemoptysis, sputum production, shortness of breath and wheezing.   Cardiovascular:  Negative for chest pain, palpitations, orthopnea and claudication.  Gastrointestinal:  Negative for abdominal pain, blood in stool, constipation, diarrhea, heartburn, melena, nausea and vomiting.  Genitourinary:  Negative for dysuria, flank pain, frequency, hematuria and urgency.  Musculoskeletal:  Negative for back pain, joint pain and myalgias.  Skin:  Negative for rash.  Neurological:  Negative for dizziness, tingling, focal weakness, seizures, weakness and  headaches.  Endo/Heme/Allergies:  Does not bruise/bleed easily.  Psychiatric/Behavioral:  Negative for depression and suicidal ideas. The patient does not have insomnia.       Allergies  Allergen Reactions    Cephalexin Anaphylaxis   Cephalosporins Anaphylaxis and Other (See Comments)   Macrobid [Nitrofurantoin Macrocrystal] Other (See Comments)    Causes methemoglobinemia   Penicillins Anaphylaxis and Other (See Comments)    Has patient had a PCN reaction causing immediate rash, facial/tongue/throat swelling, SOB or lightheadedness with hypotension: Yes Has patient had a PCN reaction causing severe rash involving mucus membranes or skin necrosis: No Has patient had a PCN reaction that required hospitalization Yes Has patient had a PCN reaction occurring within the last 10 years: No If all of the above answers are "NO", then may proceed with Cephalosporin use.    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     Past Medical History:  Diagnosis Date   Allergy    Anemia    Anxiety    Arthritis    OA Knee; non-specific autoimmune process followed by [facility]/Rheumatology.   Asthma    Blood transfusion without reported diagnosis YRS AGO   COVID-19 03/2018   Depression    Gastric ulcer, unspecified as acute or chronic, without mention of hemorrhage, perforation, or obstruction YRS AGO   GERD (gastroesophageal reflux disease)    Hyperlipidemia    Hypertension    Methemoglobinemia    Migraine    hx of migraines   Obesity, unspecified    Other chronic cystitis    Personal history of colonic polyps    Pneumonia    PONV (postoperative nausea and vomiting)    NAUSEA, SCOPOLAMINE PATCH HELPED WITH LAST SURGERIES   Pre-diabetes      Past Surgical History:  Procedure Laterality Date   ABDOMINAL HYSTERECTOMY  01/25/1994   uterine fibroids; DUB; cervical dysplasia; ovaries resected two years later.     APPENDECTOMY     BILATERAL OOPHORECTOMY  2001   BREAST BIOPSY Left 10/08/2021   10:00 ribbon, [place] bx, path pending   CESAREAN SECTION     x 2   COLON SURGERY   YRS AGO   SIGMOIDECTOMY   Colonoscopy  09/08/2012   diverticulosis, hemorrhoids.  [name].  Symptoms:  anemia, hemoccult +.   ESOPHAGOGASTRODUODENOSCOPY  09/08/2012   +HH.  [name].  Symptoms: anemia, hemoccult +.   HIP ARTHROSCOPY Right    LAPAROSCOPIC GASTRIC SLEEVE RESECTION N/A 03/01/2016   Procedure: LAPAROSCOPIC GASTRIC SLEEVE RESECTION, UPPER ENDO;  Surgeon: Gaynelle Adu, MD;  Location: [facility];  Service: General;  Laterality: N/A;   LAPAROSCOPIC LYSIS OF ADHESIONS N/A 02/10/2016   Procedure: LAPAROSCOPIC LYSIS OF ADHESIONS;  Surgeon: Gaynelle Adu, MD;  Location: [facility];  Service: General;  Laterality: N/A;   LAPAROSCOPY N/A 02/10/2016   Procedure: LAPAROSCOPY DIAGNOSTIC;  Surgeon: Gaynelle Adu, MD;  Location: [facility];  Service: General;  Laterality: N/A;   NASAL SEPTUM SURGERY     NECK SURGERY     Rheumatology Consult  11/26/2011   multiple arthralgias. Autoimmune labs negative.  Rx for Plaquenil for non-specific autoimmune process.  Lavenia Atlas.   SHOULDER ARTHROSCOPY WITH OPEN ROTATOR CUFF REPAIR AND DISTAL CLAVICLE ACROMINECTOMY Right 01/13/2021   Procedure: SHOULDER ARTHROSCOPY WITH OPEN ROTATOR CUFF REPAIR AND DISTAL CLAVICLE ACROMINECTOMY;  Surgeon: Juanell Fairly, MD;  Location: [facility];  Service: Orthopedics;  Laterality: Right;  SHOULDER ARTHROSCOPY WITH SUBACROMIAL DECOMPRESSION Right 01/13/2021   Procedure: SHOULDER ARTHROSCOPY WITH SUBACROMIAL DECOMPRESSION, BICEPS TENOTOMY;  Surgeon: Juanell Fairly, MD;  Location: [facility];  Service: Orthopedics;  Laterality: Right;   SIGMOID RESECTION / RECTOPEXY     SPINE SURGERY     TONSILLECTOMY AND ADENOIDECTOMY     TOTAL HIP ARTHROPLASTY Right 09/27/2017   Procedure: TOTAL HIP ARTHROPLASTY ANTERIOR APPROACH;  Surgeon: Kennedy Bucker, MD;  Location: [facility];  Service: Orthopedics;  Laterality: Right;   TOTAL HIP ARTHROPLASTY Left 12/27/2017   Procedure: TOTAL HIP ARTHROPLASTY ANTERIOR APPROACH;  Surgeon: Kennedy Bucker, MD;   Location: [facility];  Service: Orthopedics;  Laterality: Left;    Social History   Socioeconomic History   Marital status: Married    Spouse name: Not on file   Number of children: 2   Years of education: 12   Highest education level: Not on file  Occupational History   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    Employer: [employer]    Comment: Dermatology office  Tobacco Use   Smoking status: Former    Packs/day: 1.00    Years: 5.00    Additional pack years: 0.00    Total pack years: 5.00    Types: Cigarettes    Passive exposure: Past   Smokeless tobacco: Never   Tobacco comments:    Quit 32 years ago  Vaping Use   Vaping Use: Never used  Substance and Sexual Activity   Alcohol use: Yes    Comment: rare   Drug use: No   Sexual activity: Yes    Birth control/protection: Post-menopausal, Surgical  Other Topics Concern   Not on file  Social History Narrative   Marital status: married since 32 years; happily married; no abuse.      Children: 2 sons (24, 52); 2 grandchildren  (5yo Atlast, 3 yo August)      Lives: with husband, [name]/son.      Employment: check in at [employer] in [place] x 2004; loves job.      Tobacco abuse:  Former smoker.      Alcohol:  None; holidays only      Exercise: none in 2017     Seatbelt: 100%; no texting   Social Determinants of Health   Financial Resource Strain: Not on file  Food Insecurity: Not on file  Transportation Needs: Not on file  Physical Activity: Not on file  Stress: Not on file  Social Connections: Not on file  Intimate Partner Violence: Not on file    Family History  Problem Relation Age of Onset   Bipolar disorder Father    Transient ischemic attack Father    Dementia Father        secondary to head trauma   Hypertension Father    Diabetes Father    CAD Father    Cancer Mother 17       lung   Migraines Mother    Hypothyroidism Mother    Hypertension Sister    Atrial  fibrillation Sister    Hypothyroidism Sister    Alcohol abuse Brother    Hypertension Brother    Alcohol abuse Brother    Atrial fibrillation Maternal Grandmother    COPD Maternal Grandmother    Diabetes Maternal Grandmother    Hyperlipidemia Maternal Grandmother    Diabetes Maternal Grandfather    Diabetes Paternal Grandfather    Heart disease Paternal Grandfather    Breast cancer Sister 74  Current Outpatient Medications:    Azelastine-Fluticasone 137-50 MCG/ACT SUSP, Place 2 sprays into the nose 2 (two) times a day., Disp: 23 g, Rfl: 0   budesonide-formoterol (SYMBICORT) 160-4.5 MCG/ACT inhaler, INHALE 2 PUFFS BY MOUTH 2 TIMES DAILY, Disp: 10.2 g, Rfl: 2   busPIRone (BUSPAR) 10 MG tablet, Take 1 tablet (10 mg total) by mouth 3 (three) times daily, Disp: 270 tablet, Rfl: 2   busPIRone (BUSPAR) 15 MG tablet, Take 1 tablet (15 mg total) by mouth 3 (three) times daily., Disp: 270 tablet, Rfl: 3   DULoxetine (CYMBALTA) 60 MG capsule, Take 1 capsule (60 mg total) by mouth 2 (two) times daily., Disp: 180 capsule, Rfl: 4   dutasteride (AVODART) 0.5 MG capsule, Take 1 capsule (0.5 mg total) by mouth daily., Disp: 30 capsule, Rfl: 6   estradiol (ESTRACE) 0.1 MG/GM vaginal cream, Place 2 g vaginally twice a week, Disp: 42.5 g, Rfl: 4   gabapentin (NEURONTIN) 300 MG capsule, Take 1-3 capsules (300-900 mg total) by mouth 3 (three) times daily, Disp: 810 capsule, Rfl: 3   gabapentin (NEURONTIN) 300 MG capsule, Take 1-3 capsules (300-900 mg total) by mouth 3 (three) times daily., Disp: 810 capsule, Rfl: 2   HYDROcodone-acetaminophen (NORCO) 10-325 MG tablet, Take 1 tablet by mouth every 4 (four) hours as needed for Pain (DNF 07/24/21), Disp: 150 tablet, Rfl: 0   HYDROcodone-acetaminophen (NORCO) 10-325 MG tablet, Take 1 tablet by mouth every 4 (four) hours as needed for Pain., Disp: 150 tablet, Rfl: 0   HYDROcodone-acetaminophen (NORCO) 10-325 MG tablet, Take 1 tablet by mouth every 4 (four) hours  as needed for Pain., Disp: 150 tablet, Rfl: 0   HYDROcodone-acetaminophen (NORCO) 10-325 MG tablet, Take 1 tablet by mouth every 4 (four) hours as needed for Pain., Disp: 150 tablet, Rfl: 0   HYDROcodone-acetaminophen (NORCO) 10-325 MG tablet, Take 1 tablet by mouth every 4 (four) hours as needed for pain., Disp: 150 tablet, Rfl: 0   HYDROcodone-acetaminophen (NORCO) 10-325 MG tablet, Take 1 tablet by mouth every 4 (four) hours as needed for pain., Disp: 150 tablet, Rfl: 0   HYDROcodone-acetaminophen (NORCO) 10-325 MG tablet, Take 1 tablet by mouth every 4 (four) hours as needed., Disp: 150 tablet, Rfl: 0   HYDROcodone-acetaminophen (NORCO) 10-325 MG tablet, Take 1 tablet by mouth every 4 (four) hours as needed for pain., Disp: 150 tablet, Rfl: 0   hydrocortisone 2.5 % cream, Apply topically 2 (two) times daily as needed (Rash). At corners of mouth for up to one week., Disp: 20 g, Rfl: 0   ketoconazole (NIZORAL) 2 % shampoo, Apply 1 Application topically 3 (three) times a week. Massage into scalp and leave in for 10 minutes before rinsing out, Disp: 120 mL, Rfl: 11   latanoprost (XALATAN) 0.005 % ophthalmic solution, Place 1 drop into both eyes at bedtime., Disp: , Rfl:    levocetirizine (XYZAL) 5 MG tablet, Take 1 tablet (5 mg total) by mouth every evening, Disp: 90 tablet, Rfl: 2   methocarbamol (ROBAXIN) 750 MG tablet, Take 750 mg by mouth 3 (three) times daily as needed for muscle spasms., Disp: , Rfl:    minoxidil (LONITEN) 2.5 MG tablet, Take 1 tablet (2.5 mg total) by mouth daily., Disp: 30 tablet, Rfl: 6   mirabegron ER (MYRBETRIQ) 50 MG TB24 tablet, Take 1 tablet (50 mg total) by mouth daily., Disp: 90 tablet, Rfl: 3   montelukast (SINGULAIR) 10 MG tablet, Take 1 tablet (10 mg total) by mouth at bedtime, Disp: 90  tablet, Rfl: 2   mupirocin ointment (BACTROBAN) 2 %, Apply 1 Application topically 2 (two) times daily. To corners of mouth, Disp: 22 g, Rfl: 2   omeprazole (PRILOSEC) 40 MG capsule,  Take 40 mg by mouth 2 (two) times daily., Disp: , Rfl:    omeprazole (PRILOSEC) 40 MG capsule, Take 1 capsule (40 mg total) by mouth 2 (two) times daily before meals for 90 days, Disp: 180 capsule, Rfl: 0   promethazine (PHENERGAN) 25 MG tablet, Take 1 tablet (25 mg total) by mouth every 8 (eight) hours as needed for nausea., Disp: 20 tablet, Rfl: 2   rosuvastatin (CRESTOR) 5 MG tablet, Take 1 tablet (5 mg total) by mouth daily., Disp: 90 tablet, Rfl: 3   Semaglutide,0.25 or 0.5MG /DOS, (OZEMPIC, 0.25 OR 0.5 MG/DOSE,) 2 MG/3ML SOPN, Inject 0.5 mg into the skin once a week., Disp: 3 mL, Rfl: 5  Physical exam:  Vitals:   06/30/22 1147  BP: 134/74  Pulse: 88  Resp: 18  Temp: 98.7 F (37.1 C)  TempSrc: Tympanic  SpO2: 100%  Weight: 177 lb 14.4 oz (80.7 kg)  Height: 5\' 7"  (1.702 m)   Physical Exam Cardiovascular:     Rate and Rhythm: Normal rate and regular rhythm.     Heart sounds: Normal heart sounds.  Pulmonary:     Effort: Pulmonary effort is normal.     Breath sounds: Normal breath sounds.  Abdominal:     General: Bowel sounds are normal.     Palpations: Abdomen is soft.  Skin:    General: Skin is warm and dry.  Neurological:     Mental Status: She is alert and oriented to person, place, and time.         Latest Ref Rng & Units 06/30/2022   11:38 AM  CMP  Glucose 70 - 99 mg/dL 87   BUN 8 - 23 mg/dL 12   Creatinine 1.61 - 1.00 mg/dL 0.96   Sodium 045 - 409 mmol/L 136   Potassium 3.5 - 5.1 mmol/L 3.7   Chloride 98 - 111 mmol/L 99   CO2 22 - 32 mmol/L 27   Calcium 8.9 - 10.3 mg/dL 8.7   Total Protein 6.5 - 8.1 g/dL 6.6   Total Bilirubin 0.3 - 1.2 mg/dL 0.4   Alkaline Phos 38 - 126 U/L 54   AST 15 - 41 U/L 16   ALT 0 - 44 U/L 13       Latest Ref Rng & Units 06/30/2022   11:38 AM  CBC  WBC 4.0 - 10.5 K/uL 9.9   Hemoglobin 12.0 - 15.0 g/dL 81.1   Hematocrit 91.4 - 46.0 % 36.1   Platelets 150 - 400 K/uL 211      Assessment and plan- Patient is a 67 y.o. female who  is here for routine follow-up of history of methemoglobinemia and history of hemolytic anemia  Patient's hemoglobin was done a month ago and was at 10.4 but has improved back to 11.6.  B12 levels are pending.  Reticulocyte count is mildly elevated at 3.3 but is appropriate for the degree of mild anemia.  There does not seem to be any active hemolysis ongoing at this moment.  No further episodes of methemoglobinemia either.  I will see her back in 6 months no labs   Visit Diagnosis 1. Methemoglobinemia   2. History of autoimmune hemolytic anemia      Dr. Owens Shark, MD, MPH [facility] [phone] 06/30/2022  1:12 PM

## 2022-07-01 ENCOUNTER — Other Ambulatory Visit: Payer: Self-pay

## 2022-07-01 MED ORDER — MINOXIDIL 2.5 MG PO TABS
1.2500 mg | ORAL_TABLET | Freq: Every day | ORAL | 6 refills | Status: DC
  Filled 2022-07-01 – 2022-07-27 (×2): qty 30, 60d supply, fill #0
  Filled 2022-09-02 – 2022-10-20 (×6): qty 30, 60d supply, fill #1
  Filled 2022-12-21: qty 30, 60d supply, fill #2
  Filled ????-??-??: fill #1

## 2022-07-02 LAB — VITAMIN D 25 HYDROXY (VIT D DEFICIENCY, FRACTURES): Vit D, 25-Hydroxy: 55.7 ng/mL (ref 30.0–100.0)

## 2022-07-02 LAB — FERRITIN: Ferritin: 153 ng/mL — ABNORMAL HIGH (ref 15–150)

## 2022-07-04 ENCOUNTER — Other Ambulatory Visit: Payer: Self-pay

## 2022-07-04 MED ORDER — HYDROCODONE-ACETAMINOPHEN 10-325 MG PO TABS
1.0000 | ORAL_TABLET | ORAL | 0 refills | Status: DC | PRN
  Filled 2022-09-21: qty 120, 20d supply, fill #0
  Filled 2022-09-21: qty 30, 5d supply, fill #0

## 2022-07-04 MED ORDER — HYDROCODONE-ACETAMINOPHEN 10-325 MG PO TABS
1.0000 | ORAL_TABLET | ORAL | 0 refills | Status: DC | PRN
  Filled 2022-08-23: qty 150, 25d supply, fill #0

## 2022-07-16 ENCOUNTER — Other Ambulatory Visit: Payer: Self-pay

## 2022-07-16 MED ORDER — AZELASTINE HCL 0.1 % NA SOLN
1.0000 | Freq: Two times a day (BID) | NASAL | 12 refills | Status: AC
  Filled 2022-07-16: qty 30, 50d supply, fill #0
  Filled 2022-09-21: qty 30, 50d supply, fill #1
  Filled 2022-12-29 – 2022-12-31 (×2): qty 30, 50d supply, fill #2
  Filled 2023-04-25: qty 30, 50d supply, fill #3
  Filled 2023-06-03: qty 30, 50d supply, fill #4

## 2022-07-16 MED ORDER — PREDNISONE 10 MG PO TABS
ORAL_TABLET | ORAL | 0 refills | Status: AC
  Filled 2022-07-16: qty 21, 6d supply, fill #0

## 2022-07-21 ENCOUNTER — Other Ambulatory Visit: Payer: Self-pay

## 2022-07-23 ENCOUNTER — Other Ambulatory Visit: Payer: Self-pay

## 2022-07-27 ENCOUNTER — Other Ambulatory Visit: Payer: Self-pay

## 2022-07-28 ENCOUNTER — Other Ambulatory Visit: Payer: Self-pay

## 2022-07-28 MED ORDER — FLUCONAZOLE 100 MG PO TABS
ORAL_TABLET | ORAL | 0 refills | Status: AC
  Filled 2022-07-28: qty 11, 10d supply, fill #0
  Filled 2022-08-10: qty 15, 14d supply, fill #0

## 2022-08-02 ENCOUNTER — Other Ambulatory Visit: Payer: Self-pay

## 2022-08-05 ENCOUNTER — Other Ambulatory Visit: Payer: Self-pay

## 2022-08-05 MED ORDER — DOXYCYCLINE HYCLATE 100 MG PO CAPS
100.0000 mg | ORAL_CAPSULE | Freq: Two times a day (BID) | ORAL | 0 refills | Status: DC
  Filled 2022-08-05: qty 20, 10d supply, fill #0

## 2022-08-10 ENCOUNTER — Other Ambulatory Visit: Payer: Self-pay

## 2022-08-19 ENCOUNTER — Other Ambulatory Visit: Payer: Self-pay

## 2022-08-23 ENCOUNTER — Other Ambulatory Visit: Payer: Self-pay

## 2022-09-01 ENCOUNTER — Other Ambulatory Visit: Payer: Self-pay

## 2022-09-01 MED ORDER — BUDESONIDE-FORMOTEROL FUMARATE 160-4.5 MCG/ACT IN AERO
2.0000 | INHALATION_SPRAY | Freq: Two times a day (BID) | RESPIRATORY_TRACT | 0 refills | Status: DC
  Filled 2022-09-01 – 2022-09-21 (×2): qty 10.2, 30d supply, fill #0

## 2022-09-02 ENCOUNTER — Other Ambulatory Visit: Payer: Self-pay

## 2022-09-02 MED ORDER — FLUTICASONE FUROATE-VILANTEROL 200-25 MCG/ACT IN AEPB
1.0000 | INHALATION_SPRAY | Freq: Every day | RESPIRATORY_TRACT | 0 refills | Status: DC
  Filled 2022-09-02: qty 60, 30d supply, fill #0
  Filled 2022-09-03: qty 60, 60d supply, fill #0
  Filled 2022-09-21: qty 60, 30d supply, fill #0

## 2022-09-03 ENCOUNTER — Other Ambulatory Visit: Payer: Self-pay

## 2022-09-10 ENCOUNTER — Other Ambulatory Visit: Payer: Self-pay

## 2022-09-10 MED ORDER — BUDESONIDE-FORMOTEROL FUMARATE 160-4.5 MCG/ACT IN AERO
2.0000 | INHALATION_SPRAY | Freq: Two times a day (BID) | RESPIRATORY_TRACT | 5 refills | Status: AC
  Filled 2022-09-10 – 2023-04-29 (×9): qty 10.2, 30d supply, fill #0

## 2022-09-13 ENCOUNTER — Other Ambulatory Visit: Payer: Self-pay

## 2022-09-15 ENCOUNTER — Other Ambulatory Visit: Payer: Self-pay

## 2022-09-15 ENCOUNTER — Other Ambulatory Visit (HOSPITAL_COMMUNITY): Payer: Self-pay

## 2022-09-15 MED ORDER — FLUTICASONE FUROATE-VILANTEROL 200-25 MCG/ACT IN AEPB
1.0000 | INHALATION_SPRAY | Freq: Every day | RESPIRATORY_TRACT | 5 refills | Status: AC
  Filled 2022-09-15: qty 60, 30d supply, fill #0
  Filled 2022-12-29 – 2022-12-31 (×2): qty 60, 30d supply, fill #1
  Filled 2023-05-16: qty 60, 30d supply, fill #2
  Filled 2023-07-04 – 2023-07-17 (×2): qty 60, 30d supply, fill #3

## 2022-09-21 ENCOUNTER — Other Ambulatory Visit: Payer: Self-pay

## 2022-09-22 ENCOUNTER — Other Ambulatory Visit: Payer: Self-pay

## 2022-09-22 MED ORDER — HYDROCODONE-ACETAMINOPHEN 10-325 MG PO TABS
1.0000 | ORAL_TABLET | ORAL | 0 refills | Status: DC | PRN
  Filled 2022-10-19: qty 150, 25d supply, fill #0

## 2022-09-30 ENCOUNTER — Other Ambulatory Visit: Payer: Self-pay

## 2022-10-06 ENCOUNTER — Other Ambulatory Visit (HOSPITAL_COMMUNITY): Payer: Self-pay

## 2022-10-06 ENCOUNTER — Other Ambulatory Visit: Payer: Self-pay

## 2022-10-12 ENCOUNTER — Other Ambulatory Visit: Payer: Self-pay

## 2022-10-19 ENCOUNTER — Other Ambulatory Visit: Payer: Self-pay

## 2022-10-20 ENCOUNTER — Other Ambulatory Visit: Payer: Self-pay

## 2022-10-27 ENCOUNTER — Ambulatory Visit: Payer: PPO | Admitting: Dermatology

## 2022-10-27 DIAGNOSIS — D485 Neoplasm of uncertain behavior of skin: Secondary | ICD-10-CM

## 2022-10-27 DIAGNOSIS — M67442 Ganglion, left hand: Secondary | ICD-10-CM | POA: Diagnosis not present

## 2022-10-27 DIAGNOSIS — D492 Neoplasm of unspecified behavior of bone, soft tissue, and skin: Secondary | ICD-10-CM

## 2022-10-27 NOTE — Progress Notes (Signed)
   Follow-Up Visit   Subjective  Bethany Scott is a 67 y.o. female who presents for the following: Bump on the left finger, bothersome, patient picks at. She would like removed.   The following portions of the chart were reviewed this encounter and updated as appropriate: medications, allergies, medical history  Review of Systems:  No other skin or systemic complaints except as noted in HPI or Assessment and Plan.  Objective  Well appearing patient in no apparent distress; mood and affect are within normal limits.  A focused examination was performed of the following areas: Hand, finger  Relevant physical exam findings are noted in the Assessment and Plan.  left 3rd digit 4.0 mm smooth pink colored to translucent papule at proximal nail fold    Assessment & Plan   Neoplasm of uncertain behavior of skin left 3rd digit  Epidermal / dermal shaving  Lesion diameter (cm):  0.4 Informed consent: discussed and consent obtained   Patient was prepped and draped in usual sterile fashion: Area prepped with alcohol. Anesthesia: the lesion was anesthetized in a standard fashion   Anesthetic:  1% lidocaine w/ epinephrine 1-100,000 buffered w/ 8.4% NaHCO3 Instrument used: flexible razor blade   Hemostasis achieved with: pressure, aluminum chloride and electrodesiccation   Outcome: patient tolerated procedure well   Post-procedure details: wound care instructions given   Post-procedure details comment:  Ointment and small bandage applied  Specimen 1 - Surgical pathology Differential Diagnosis: Digital Mucous Cyst vs other Check Margins: No     Return if symptoms worsen or fail to improve.  ICherlyn Labella, CMA, am acting as scribe for Willeen Niece, MD .   Documentation: I have reviewed the above documentation for accuracy and completeness, and I agree with the above.  Willeen Niece, MD

## 2022-10-27 NOTE — Patient Instructions (Signed)

## 2022-10-28 ENCOUNTER — Other Ambulatory Visit: Payer: Self-pay

## 2022-10-29 LAB — SURGICAL PATHOLOGY

## 2022-11-05 ENCOUNTER — Other Ambulatory Visit: Payer: Self-pay

## 2022-11-05 MED ORDER — HYDROCODONE-ACETAMINOPHEN 10-325 MG PO TABS
1.0000 | ORAL_TABLET | ORAL | 0 refills | Status: AC | PRN
  Filled 2022-11-17: qty 150, 25d supply, fill #0

## 2022-11-05 MED ORDER — HYDROCODONE-ACETAMINOPHEN 10-325 MG PO TABS
1.0000 | ORAL_TABLET | ORAL | 0 refills | Status: DC | PRN
  Filled 2023-01-13: qty 150, 25d supply, fill #0

## 2022-11-05 MED ORDER — HYDROCODONE-ACETAMINOPHEN 10-325 MG PO TABS
1.0000 | ORAL_TABLET | ORAL | 0 refills | Status: AC | PRN
  Filled 2022-12-16: qty 150, 25d supply, fill #0

## 2022-11-05 MED ORDER — OMEPRAZOLE 40 MG PO CPDR
40.0000 mg | DELAYED_RELEASE_CAPSULE | Freq: Every day | ORAL | 3 refills | Status: AC
  Filled 2022-11-05: qty 90, 90d supply, fill #0
  Filled 2023-04-29: qty 90, 90d supply, fill #1
  Filled 2023-08-30 – 2023-11-01 (×5): qty 90, 90d supply, fill #2

## 2022-11-08 ENCOUNTER — Other Ambulatory Visit: Payer: Self-pay

## 2022-11-11 ENCOUNTER — Other Ambulatory Visit: Payer: Self-pay

## 2022-11-11 MED ORDER — DOXYCYCLINE MONOHYDRATE 100 MG PO CAPS: ORAL_CAPSULE | ORAL | 0 refills | Status: DC

## 2022-11-15 ENCOUNTER — Emergency Department
Admission: EM | Admit: 2022-11-15 | Discharge: 2022-11-15 | Discharge status: Against Medical Advice | Payer: PPO | Attending: Emergency Medicine | Admitting: Emergency Medicine

## 2022-11-15 ENCOUNTER — Ambulatory Visit: Payer: PPO | Admitting: Urology

## 2022-11-15 ENCOUNTER — Emergency Department: Payer: PPO

## 2022-11-15 ENCOUNTER — Encounter: Payer: Self-pay | Admitting: Urology

## 2022-11-15 ENCOUNTER — Telehealth: Payer: Self-pay | Admitting: *Deleted

## 2022-11-15 ENCOUNTER — Other Ambulatory Visit: Payer: Self-pay

## 2022-11-15 VITALS — BP 111/76 | HR 95

## 2022-11-15 DIAGNOSIS — N3281 Overactive bladder: Secondary | ICD-10-CM

## 2022-11-15 DIAGNOSIS — N3020 Other chronic cystitis without hematuria: Secondary | ICD-10-CM | POA: Diagnosis not present

## 2022-11-15 DIAGNOSIS — R0602 Shortness of breath: Secondary | ICD-10-CM | POA: Diagnosis not present

## 2022-11-15 DIAGNOSIS — R531 Weakness: Secondary | ICD-10-CM | POA: Insufficient documentation

## 2022-11-15 DIAGNOSIS — R238 Other skin changes: Secondary | ICD-10-CM | POA: Insufficient documentation

## 2022-11-15 DIAGNOSIS — Z5321 Procedure and treatment not carried out due to patient leaving prior to being seen by health care provider: Secondary | ICD-10-CM | POA: Insufficient documentation

## 2022-11-15 DIAGNOSIS — N3946 Mixed incontinence: Secondary | ICD-10-CM | POA: Diagnosis not present

## 2022-11-15 LAB — URINALYSIS, COMPLETE

## 2022-11-15 LAB — CBC
HCT: 37.3 % (ref 36.0–46.0)
Hemoglobin: 11.9 g/dL — ABNORMAL LOW (ref 12.0–15.0)
MCH: 30.4 pg (ref 26.0–34.0)
MCHC: 31.9 g/dL (ref 30.0–36.0)
MCV: 95.2 fL (ref 80.0–100.0)
Platelets: 357 10*3/uL (ref 150–400)
RBC: 3.92 MIL/uL (ref 3.87–5.11)
RDW: 15.3 % (ref 11.5–15.5)
WBC: 8 10*3/uL (ref 4.0–10.5)
nRBC: 0 % (ref 0.0–0.2)

## 2022-11-15 LAB — BASIC METABOLIC PANEL
Anion gap: 10 (ref 5–15)
BUN: 20 mg/dL (ref 8–23)
CO2: 26 mmol/L (ref 22–32)
Calcium: 9.7 mg/dL (ref 8.9–10.3)
Chloride: 99 mmol/L (ref 98–111)
Creatinine, Ser: 0.9 mg/dL (ref 0.44–1.00)
GFR, Estimated: >60 mL/min (ref >60)
Glucose, Bld: 87 mg/dL (ref 70–99)
Potassium: 4.5 mmol/L (ref 3.5–5.1)
Sodium: 135 mmol/L (ref 135–145)

## 2022-11-15 LAB — MICROSCOPIC EXAMINATION

## 2022-11-15 MED ORDER — NITROFURANTOIN MONOHYD MACRO 100 MG PO CAPS
100.0000 mg | ORAL_CAPSULE | Freq: Every day | ORAL | 1 refills | Status: AC | PRN
  Filled 2022-11-15: qty 60, 60d supply, fill #0
  Filled 2023-04-29: qty 60, 60d supply, fill #1

## 2022-11-15 MED ORDER — SULFAMETHOXAZOLE-TRIMETHOPRIM 800-160 MG PO TABS
1.0000 | ORAL_TABLET | Freq: Two times a day (BID) | ORAL | 0 refills | Status: AC
  Filled 2022-11-15: qty 14, 7d supply, fill #0

## 2022-11-15 MED ORDER — PHENAZOPYRIDINE HCL 100 MG PO TABS
100.0000 mg | ORAL_TABLET | Freq: Two times a day (BID) | ORAL | 6 refills | Status: AC | PRN
  Filled 2022-11-15: qty 20, 10d supply, fill #0
  Filled 2022-11-29: qty 20, 10d supply, fill #1
  Filled 2023-02-17: qty 20, 10d supply, fill #2
  Filled 2023-04-29: qty 20, 10d supply, fill #3
  Filled 2023-06-24: qty 20, 10d supply, fill #4
  Filled 2023-09-14: qty 20, 10d supply, fill #5
  Filled 2023-10-10: qty 20, 10d supply, fill #6

## 2022-11-15 MED ORDER — MIRABEGRON ER 50 MG PO TB24
50.0000 mg | ORAL_TABLET | Freq: Every day | ORAL | 3 refills | Status: DC
  Filled 2022-11-15: qty 90, 90d supply, fill #0
  Filled 2023-04-29: qty 90, 90d supply, fill #1
  Filled 2023-06-24 – 2023-10-10 (×3): qty 90, 90d supply, fill #2

## 2022-11-15 NOTE — Progress Notes (Signed)
11/15/2022 11:26 AM   Juluis Pitch 08-08-1955 [id number]  Referring provider: Ethelda Chick, MD [address] [place],  [place] [address]  Chief Complaint  Patient presents with   Follow-up   Cystitis    HPI: I was consulted to assist the patient's incontinence.  She sometimes leaks with coughing sneezing.  She can have urge incontinence of her bladder is too full.  She changes her underwear designed for incontinence once or twice a day.   She voids every 1 hour to 90 minutes and gets up once or twice a night.  Her flow was good but she has to concentrate.  She stops and starts but feels empty   She says she gets more than 12 bladder infections a year.  She has burning and urgency.  She had Pyridium at home and was taking Macrobid after intercourse.  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   She has had neck surgery and hysterectomy.   She is currently doing physical therapy for incontinence which helping   She cannot take cephalosporins and penicillin due to anaphylaxis.  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  She thinks she can take sulfa but they do not want her to take Macrodantin for unknown reason     Patient has mild mixed incontinence.  I am not convinced she is having as many urinary tract infections that she says she is.  She has a lot of issue with medication and Amoxil put her on prophylaxis.  I am not can order urodynamics I thought was reasonable to start her on Myrbetriq 50 mg samples and prescription and continue with physical therapy and call if culture positive.  I ordered a renal ultrasound and call if abnormal.    Today Frequency stable.  Last culture negative.  Ultrasound normal. Incontinence dramatically  better.  No infections.  This is durability in 4 months   Today Frequency stable.  Urge incontinence dramatically better.  Myrbetriq and physical therapy worked great.  No infections  Today Patient is still doing quite well with overactive bladder on Myrbetriq.  In the last week she has burning and frequency.  She says she is miserable.  She said she used to take Macrobid after intercourse and the following day to prevent infections and her primary care would give her 74.  She wants a prescription of Pyridium and not over-the-counter.  I spoke to her about the Macrodantin and Pyridium issue noted above.  Patient clarified that she can take ciprofloxacin sulfa Pyridium and Macrobid.  It is the Levaquin that is the issue.   PMH: Past Medical History:  Diagnosis Date   Allergy    Anemia    Anxiety    Arthritis    OA Knee; non-specific autoimmune process followed by [facility]/Rheumatology.   Asthma    Blood transfusion without reported diagnosis YRS AGO   COVID-19 03/2018   Depression    Gastric ulcer, unspecified as acute or chronic, without mention of hemorrhage, perforation, or obstruction YRS AGO   GERD (gastroesophageal reflux disease)    Hyperlipidemia    Hypertension    Methemoglobinemia    Migraine    hx of migraines   Obesity, unspecified    Other chronic cystitis  Personal history of colonic polyps    Pneumonia    PONV (postoperative nausea and vomiting)    NAUSEA, SCOPOLAMINE PATCH HELPED WITH LAST SURGERIES   Pre-diabetes     Surgical History: Past Surgical History:  Procedure Laterality Date   ABDOMINAL HYSTERECTOMY  01/25/1994   uterine fibroids; DUB; cervical dysplasia; ovaries resected two years later.     APPENDECTOMY     BILATERAL OOPHORECTOMY  2001   BREAST BIOPSY Left 10/08/2021   10:00 ribbon, [place] bx, path pending   CESAREAN SECTION     x 2   COLON SURGERY  YRS AGO   SIGMOIDECTOMY   Colonoscopy  09/08/2012   diverticulosis, hemorrhoids.   [name].  Symptoms:  anemia, hemoccult +.   ESOPHAGOGASTRODUODENOSCOPY  09/08/2012   +HH.  [name].  Symptoms: anemia, hemoccult +.   HIP ARTHROSCOPY Right    LAPAROSCOPIC GASTRIC SLEEVE RESECTION N/A 03/01/2016   Procedure: LAPAROSCOPIC GASTRIC SLEEVE RESECTION, UPPER ENDO;  Surgeon: Gaynelle Adu, MD;  Location: [facility];  Service: General;  Laterality: N/A;   LAPAROSCOPIC LYSIS OF ADHESIONS N/A 02/10/2016   Procedure: LAPAROSCOPIC LYSIS OF ADHESIONS;  Surgeon: Gaynelle Adu, MD;  Location: [facility];  Service: General;  Laterality: N/A;   LAPAROSCOPY N/A 02/10/2016   Procedure: LAPAROSCOPY DIAGNOSTIC;  Surgeon: Gaynelle Adu, MD;  Location: [facility];  Service: General;  Laterality: N/A;   NASAL SEPTUM SURGERY     NECK SURGERY     Rheumatology Consult  11/26/2011   multiple arthralgias. Autoimmune labs negative.  Rx for Plaquenil for non-specific autoimmune process.  [facility].   SHOULDER ARTHROSCOPY WITH OPEN ROTATOR CUFF REPAIR AND DISTAL CLAVICLE ACROMINECTOMY Right 01/13/2021   Procedure: SHOULDER ARTHROSCOPY WITH OPEN ROTATOR CUFF REPAIR AND DISTAL CLAVICLE ACROMINECTOMY;  Surgeon: Juanell Fairly, MD;  Location: [facility];  Service: Orthopedics;  Laterality: Right;   SHOULDER ARTHROSCOPY WITH SUBACROMIAL DECOMPRESSION Right 01/13/2021   Procedure: SHOULDER ARTHROSCOPY WITH SUBACROMIAL DECOMPRESSION, BICEPS TENOTOMY;  Surgeon: Juanell Fairly, MD;  Location: [facility];  Service: Orthopedics;  Laterality: Right;   SIGMOID RESECTION / RECTOPEXY     SPINE SURGERY     TONSILLECTOMY AND ADENOIDECTOMY     TOTAL HIP ARTHROPLASTY Right 09/27/2017   Procedure: TOTAL HIP ARTHROPLASTY ANTERIOR APPROACH;  Surgeon: Kennedy Bucker, MD;  Location: [facility];  Service: Orthopedics;  Laterality: Right;   TOTAL HIP ARTHROPLASTY Left 12/27/2017   Procedure: TOTAL HIP ARTHROPLASTY ANTERIOR APPROACH;  Surgeon: Kennedy Bucker, MD;  Location: [facility];  Service: Orthopedics;  Laterality: Left;    Home Medications:   Allergies as of 11/15/2022       Reactions   Cephalexin Anaphylaxis   Cephalosporins Anaphylaxis, Other (See Comments)   Macrobid [nitrofurantoin Macrocrystal] Other (See Comments)   Causes methemoglobinemia   Penicillins Anaphylaxis, Other (See Comments)   Has patient had a PCN reaction causing immediate rash, facial/tongue/throat swelling, SOB or lightheadedness with hypotension: Yes Has patient had a PCN reaction causing severe rash involving mucus membranes or skin necrosis: No Has patient had a PCN reaction that required hospitalization Yes Has patient had a PCN reaction occurring within the last 10 years: No If all of the above answers are "NO", then may proceed with Cephalosporin use.   Phenazopyridine    Other reaction(s): Other (See Comments) Methemoglobinemia   Levaquin [levofloxacin] Other (See Comments)   Hemolytic anemia due to Levaquin        Medication List        Accurate as of November 15, 2022 11:26 AM.  If you have any questions, ask your nurse or doctor.          STOP taking these medications    doxycycline 100 MG capsule Commonly known as: MONODOX Stopped by: Lorin Picket A Darryon Bastin       TAKE these medications    Azelastine HCl 137 MCG/SPRAY Soln Place 1-2 sprays into both nostrils 2 (two) times daily.   Breo Ellipta 200-25 MCG/ACT Aepb Generic drug: fluticasone furoate-vilanterol Inhale 1 puff into the lungs daily.   budesonide-formoterol 160-4.5 MCG/ACT inhaler Commonly known as: Symbicort Inhale 2 puffs into the lungs 2 (two) times daily.   busPIRone 15 MG tablet Commonly known as: BUSPAR Take 1 tablet (15 mg total) by mouth 3 (three) times daily.   DULoxetine 60 MG capsule Commonly known as: CYMBALTA Take 1 capsule (60 mg total) by mouth 2 (two) times daily.   dutasteride 0.5 MG capsule Commonly known as: AVODART Take 1 capsule (0.5 mg total) by mouth daily.   estradiol 0.1 MG/GM vaginal cream Commonly known as: ESTRACE Place 2  g vaginally twice a week   gabapentin 300 MG capsule Commonly known as: NEURONTIN Take 1-3 capsules (300-900 mg total) by mouth 3 (three) times daily.   HYDROcodone-acetaminophen 10-325 MG tablet Commonly known as: NORCO Take 1 tablet by mouth every 4 (four) hours as needed for pain. Start taking on: November 17, 2022   HYDROcodone-acetaminophen 10-325 MG tablet Commonly known as: NORCO Take 1 tablet by mouth every 4 (four) hours as needed for pain. Start taking on: December 16, 2022   HYDROcodone-acetaminophen 10-325 MG tablet Commonly known as: NORCO Take 1 tablet by mouth every 4 (four) hours as needed for pain. Start taking on: January 13, 2023   ketoconazole 2 % shampoo Commonly known as: NIZORAL Apply 1 Application topically 3 (three) times a week. Massage into scalp and leave in for 10 minutes before rinsing out   levocetirizine 5 MG tablet Commonly known as: XYZAL Take 1 tablet (5 mg total) by mouth every evening   methocarbamol 750 MG tablet Commonly known as: ROBAXIN Take 750 mg by mouth 3 (three) times daily as needed for muscle spasms.   minoxidil 2.5 MG tablet Commonly known as: LONITEN Take 0.5 tablets (1.25 mg total) by mouth daily.   montelukast 10 MG tablet Commonly known as: SINGULAIR Take 1 tablet (10 mg total) by mouth at bedtime   mupirocin ointment 2 % Commonly known as: BACTROBAN Apply to affected area twice a day to corners of mouth (Apply 1 Application topically 2 (two) times daily. To corners of mouth)   Myrbetriq 50 MG Tb24 tablet Generic drug: mirabegron ER Take 1 tablet (50 mg total) by mouth daily.   omeprazole 40 MG capsule Commonly known as: PRILOSEC Take 1 capsule (40 mg total) by mouth daily.   Ozempic (0.25 or 0.5 MG/DOSE) 2 MG/3ML Sopn Generic drug: Semaglutide(0.25 or 0.5MG /DOS) Inject 0.5 mg into the skin once a week.   rosuvastatin 5 MG tablet Commonly known as: CRESTOR Take 1 tablet (5 mg total) by mouth daily.         Allergies:  Allergies  Allergen Reactions   Cephalexin Anaphylaxis   Cephalosporins Anaphylaxis and Other (See Comments)   Macrobid [Nitrofurantoin Macrocrystal] Other (See Comments)    Causes methemoglobinemia   Penicillins Anaphylaxis and Other (See Comments)    Has patient had a PCN reaction causing immediate rash, facial/tongue/throat swelling, SOB or lightheadedness with hypotension: Yes Has patient had a PCN reaction causing severe rash  involving mucus membranes or skin necrosis: No Has patient had a PCN reaction that required hospitalization Yes Has patient had a PCN reaction occurring within the last 10 years: No If all of the above answers are "NO", then may proceed with Cephalosporin use.    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    Family History: Family History  Problem Relation Age of Onset   Bipolar disorder Father    Transient ischemic attack Father    Dementia Father        secondary to head trauma   Hypertension Father    Diabetes Father    CAD Father    Cancer Mother 77       lung   Migraines Mother    Hypothyroidism Mother    Hypertension Sister    Atrial fibrillation Sister    Hypothyroidism Sister    Alcohol abuse Brother    Hypertension Brother    Alcohol abuse Brother    Atrial fibrillation Maternal Grandmother    COPD Maternal Grandmother    Diabetes Maternal Grandmother    Hyperlipidemia Maternal Grandmother    Diabetes Maternal Grandfather    Diabetes Paternal Grandfather    Heart disease Paternal Grandfather    Breast cancer Sister 4    Social History:  reports that she has quit smoking. Her smoking use included cigarettes. She has a 5 pack-year smoking history. She has been exposed to tobacco smoke. She has never used smokeless tobacco. She reports current alcohol use. She reports that she does not use  drugs.  ROS:                                        Physical Exam: BP 111/76   Pulse 95   Constitutional:  Alert and oriented, No acute distress. HEENT: [place] AT, moist mucus membranes.  Trachea midline, no masses.   Laboratory Data: Lab Results  Component Value Date   WBC 9.9 06/30/2022   HGB 11.6 (L) 06/30/2022   HCT 36.1 06/30/2022   MCV 103.4 (H) 06/30/2022   PLT 211 06/30/2022    Lab Results  Component Value Date   CREATININE 0.58 06/30/2022    No results found for: "PSA"  No results found for: "TESTOSTERONE"  Lab Results  Component Value Date   HGBA1C 4.7 (L) 04/30/2017    Urinalysis    Component Value Date/Time   COLORURINE YELLOW (A) 10/06/2021 1711   APPEARANCEUR Clear 04/19/2022 0856   LABSPEC 1.004 (L) 10/06/2021 1711   PHURINE 6.0 10/06/2021 1711   GLUCOSEU Negative 04/19/2022 0856   HGBUR SMALL (A) 10/06/2021 1711   BILIRUBINUR Negative 04/19/2022 0856   KETONESUR NEGATIVE 10/06/2021 1711   PROTEINUR Negative 04/19/2022 0856   PROTEINUR NEGATIVE 10/06/2021 1711   UROBILINOGEN >=8.0 (A) 04/05/2018 1633   NITRITE Negative 04/19/2022 0856   NITRITE NEGATIVE 10/06/2021 1711   LEUKOCYTESUR Negative 04/19/2022 0856   LEUKOCYTESUR NEGATIVE 10/06/2021 1711    Pertinent Imaging:   Assessment & Plan: Patient called in Bactrim DS 1 tablet twice a day for 7 days.  Call if culture differs.  I gave her 5 Macrobid with 1 refill.  I gave her Pyridium 100 mg with refills.  I renewed the Myrbetriq 30 x 11  1. Mixed incontinence  - Urinalysis, Complete -  CULTURE, URINE COMPREHENSIVE   No follow-ups on file.  Martina Sinner, MD  [facility] [address] [place], [place] [address] [phone]

## 2022-11-15 NOTE — Telephone Encounter (Signed)
Patient called reporting that she is in waiting room of ER with a yellow face and purple lips. She states "I don't think there is anybody there that is like the night where they got me back there"

## 2022-11-15 NOTE — ED Triage Notes (Signed)
Pt has METHB anemia. Experiencing an excerebration of weakness, SOB, blue lips, yellow skin. Pt says she stays below 90% O2 RA

## 2022-11-15 NOTE — ED Notes (Signed)
Pt placed on 4L [place] and increased to 93%. Pt states finger pulse ox will often show lower reading than a blood gas level.

## 2022-11-15 NOTE — ED Provider Triage Note (Signed)
Emergency Medicine Provider Triage Evaluation Note  Bethany Scott, a 67 y.o. female  was evaluated in triage.  Pt complains of generalized weakness, acute on chronic SOB, consistent with her diagnosis of MethHgb. She presents to the ED from work for evaluation.   Review of Systems  Positive: SOB, CP Negative: FCS  Physical Exam  BP (!) 160/75   Pulse (!) 101   Temp 98.7 F (37.1 C) (Oral)   Resp 20   SpO2 (!) 82%  Gen:   Awake, no distress  NAD Resp:  Normal effort CTA MSK:   Moves extremities without difficulty  Other:  Scleral ictherus  Medical Decision Making  Medically screening exam initiated at 4:32 PM.  Appropriate orders placed.  Bethany Scott was informed that the remainder of the evaluation will be completed by another provider, this initial triage assessment does not replace that evaluation, and the importance of remaining in the ED until their evaluation is complete.  Increased DOE/SOB with history of MethHgb exacerbation.    Lissa Hoard, PA-C 11/15/22 1635

## 2022-11-16 NOTE — Telephone Encounter (Signed)
Can you call her and see how she is doing? It appears she was discharged from the ER

## 2022-11-16 NOTE — Telephone Encounter (Signed)
Call returned to patient after discussion with Dr Smith Robert that we cannot see  her in office for this and that she needs to be evaluated in ER for this whether it be [place], [place], [place] , or [place]. Patient states that she has not had good luck with ER's for this but has decided that she will go the [facility] for evaluate and treatment . I asked that she call me back to let me know how it goes. She agreed to do so

## 2022-11-16 NOTE — Telephone Encounter (Signed)
She left without being seen after sitting for 6 hours and not seeing a doctor. She is still jaundiced and she is short of breath, lips are not as purple as they were. Vomiting this morning. She is still having nausea even after taking promethazine she thinks she vomited it up. She would really like to come in here to get meth blue infusion. Please advise

## 2022-11-17 ENCOUNTER — Other Ambulatory Visit: Payer: Self-pay

## 2022-11-18 LAB — CULTURE, URINE COMPREHENSIVE

## 2022-11-22 ENCOUNTER — Other Ambulatory Visit: Payer: Self-pay | Admitting: *Deleted

## 2022-11-22 ENCOUNTER — Telehealth: Payer: Self-pay | Admitting: *Deleted

## 2022-11-22 ENCOUNTER — Encounter: Payer: Self-pay | Admitting: *Deleted

## 2022-11-22 DIAGNOSIS — Z87898 Personal history of other specified conditions: Secondary | ICD-10-CM

## 2022-11-22 DIAGNOSIS — R3915 Urgency of urination: Secondary | ICD-10-CM

## 2022-11-22 DIAGNOSIS — D749 Methemoglobinemia, unspecified: Secondary | ICD-10-CM

## 2022-11-22 NOTE — Telephone Encounter (Signed)
Yes we can do that. She needs cbc with diff, ferritin and iron studies b12 folate and haptoglobin and bmp level checked in 2 days. I will see her on Monday 11/4. No labs

## 2022-11-22 NOTE — Telephone Encounter (Signed)
Patient was discharged from [facility] and told that she needs to have her labs rechecked 2 days post discharge and said that it has been more than that, but is asking if Dr Smith Bethany Scott would see her and draw those labs for her. Please advise

## 2022-11-23 ENCOUNTER — Inpatient Hospital Stay: Payer: PPO | Attending: Oncology

## 2022-11-23 ENCOUNTER — Encounter: Payer: Self-pay | Admitting: *Deleted

## 2022-11-23 DIAGNOSIS — R3915 Urgency of urination: Secondary | ICD-10-CM | POA: Insufficient documentation

## 2022-11-23 DIAGNOSIS — D749 Methemoglobinemia, unspecified: Secondary | ICD-10-CM | POA: Insufficient documentation

## 2022-11-23 DIAGNOSIS — Z862 Personal history of diseases of the blood and blood-forming organs and certain disorders involving the immune mechanism: Secondary | ICD-10-CM | POA: Insufficient documentation

## 2022-11-23 DIAGNOSIS — Z87898 Personal history of other specified conditions: Secondary | ICD-10-CM

## 2022-11-23 LAB — CBC WITH DIFFERENTIAL/PLATELET
Abs Immature Granulocytes: 0.07 10*3/uL (ref 0.00–0.07)
Basophils Absolute: 0 10*3/uL (ref 0.0–0.1)
Basophils Relative: 1 %
Eosinophils Absolute: 0.4 10*3/uL (ref 0.0–0.5)
Eosinophils Relative: 5 %
HCT: 23.5 % — ABNORMAL LOW (ref 36.0–46.0)
Hemoglobin: 7.6 g/dL — ABNORMAL LOW (ref 12.0–15.0)
Immature Granulocytes: 1 %
Lymphocytes Relative: 19 %
Lymphs Abs: 1.6 10*3/uL (ref 0.7–4.0)
MCH: 33.5 pg (ref 26.0–34.0)
MCHC: 32.3 g/dL (ref 30.0–36.0)
MCV: 103.5 fL — ABNORMAL HIGH (ref 80.0–100.0)
Monocytes Absolute: 0.8 10*3/uL (ref 0.1–1.0)
Monocytes Relative: 10 %
Neutro Abs: 5.5 10*3/uL (ref 1.7–7.7)
Neutrophils Relative %: 64 %
Platelets: 246 10*3/uL (ref 150–400)
RBC: 2.27 MIL/uL — ABNORMAL LOW (ref 3.87–5.11)
RDW: 16.6 % — ABNORMAL HIGH (ref 11.5–15.5)
WBC: 8.5 10*3/uL (ref 4.0–10.5)
nRBC: 0 % (ref 0.0–0.2)

## 2022-11-23 LAB — URINALYSIS, COMPLETE (UACMP) WITH MICROSCOPIC
Bilirubin Urine: NEGATIVE
Glucose, UA: NEGATIVE mg/dL
Hgb urine dipstick: NEGATIVE
Ketones, ur: NEGATIVE mg/dL
Nitrite: NEGATIVE
Protein, ur: NEGATIVE mg/dL
Specific Gravity, Urine: 1.02 (ref 1.005–1.030)
pH: 5 (ref 5.0–8.0)

## 2022-11-23 LAB — VITAMIN B12: Vitamin B-12: 748 pg/mL (ref 180–914)

## 2022-11-23 LAB — IRON AND TIBC
Iron: 73 ug/dL (ref 28–170)
Saturation Ratios: 23 % (ref 10.4–31.8)
TIBC: 321 ug/dL (ref 250–450)
UIBC: 248 ug/dL

## 2022-11-23 LAB — BASIC METABOLIC PANEL
Anion gap: 9 (ref 5–15)
BUN: 20 mg/dL (ref 8–23)
CO2: 25 mmol/L (ref 22–32)
Calcium: 9.3 mg/dL (ref 8.9–10.3)
Chloride: 103 mmol/L (ref 98–111)
Creatinine, Ser: 0.89 mg/dL (ref 0.44–1.00)
GFR, Estimated: >60 mL/min (ref >60)
Glucose, Bld: 86 mg/dL (ref 70–99)
Potassium: 4.3 mmol/L (ref 3.5–5.1)
Sodium: 137 mmol/L (ref 135–145)

## 2022-11-23 LAB — FOLATE: Folate: >40 ng/mL (ref >5.9)

## 2022-11-23 LAB — FERRITIN: Ferritin: 331 ng/mL — ABNORMAL HIGH (ref 11–307)

## 2022-11-24 LAB — URINE CULTURE: Culture: <10000 — AB

## 2022-11-24 LAB — HAPTOGLOBIN: Haptoglobin: 16 mg/dL — ABNORMAL LOW (ref 37–355)

## 2022-11-29 ENCOUNTER — Encounter: Payer: Self-pay | Admitting: Oncology

## 2022-11-29 ENCOUNTER — Inpatient Hospital Stay: Payer: PPO | Attending: Oncology | Admitting: Oncology

## 2022-11-29 ENCOUNTER — Other Ambulatory Visit: Payer: Self-pay

## 2022-11-29 VITALS — BP 107/54 | HR 70 | Temp 97.3°F | Resp 18 | Ht 67.0 in | Wt 175.6 lb

## 2022-11-29 DIAGNOSIS — Z87440 Personal history of urinary (tract) infections: Secondary | ICD-10-CM | POA: Insufficient documentation

## 2022-11-29 DIAGNOSIS — Z09 Encounter for follow-up examination after completed treatment for conditions other than malignant neoplasm: Secondary | ICD-10-CM | POA: Insufficient documentation

## 2022-11-29 DIAGNOSIS — D649 Anemia, unspecified: Secondary | ICD-10-CM | POA: Insufficient documentation

## 2022-11-29 DIAGNOSIS — N390 Urinary tract infection, site not specified: Secondary | ICD-10-CM | POA: Diagnosis not present

## 2022-11-29 DIAGNOSIS — R5383 Other fatigue: Secondary | ICD-10-CM | POA: Diagnosis not present

## 2022-11-29 DIAGNOSIS — D749 Methemoglobinemia, unspecified: Secondary | ICD-10-CM

## 2022-11-29 MED ORDER — FOLIC ACID 1 MG PO TABS
1.0000 mg | ORAL_TABLET | Freq: Every day | ORAL | 3 refills | Status: DC
  Filled 2022-11-29: qty 30, 30d supply, fill #0
  Filled 2022-12-29 – 2022-12-31 (×2): qty 30, 30d supply, fill #1
  Filled 2023-02-17: qty 30, 30d supply, fill #2
  Filled 2023-04-06: qty 30, 30d supply, fill #3

## 2022-11-29 NOTE — Progress Notes (Signed)
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  She also has a history of iron deficiency and folate acid deficiency.  Patient was admitted for bilateral pneumonia in August 2022 at that time she was treated with antibiotics.  At that time she was evaluated for a hemoglobin of 6.6 and underwent a comprehensive work-up which showed normal ferritin of 183 with a normal iron saturation folate level normal at 18.8 B12 levels normal at 494 immunoglobulins were normal haptoglobin was low initially at less than 10 and subsequently in May 2023 it was still low at 16 LDH has been normal.  She has had G6PD levels checked 3 different occasions over the last 1 month and they have been within normal limits.  Coombs test in April 2023 was negative.  PNH profile testing negative.  Her hemoglobin which was 6.6 in August 2022 subsequently recovered to 10.5-11 over the next 6 months.  Hemoglobinopathy evaluation negative. 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 She did not require steroids or other treatment for her hemolytic anemia.    Patient presented to the ER with symptoms of shortness of breath and her lips turning purple in June 2023.  She was given Azo and Macrobid recently for UTI.Patient had a coexemetry panel performed in the  hospital which showed an elevated methemoglobin level of 24.7%.  Carboxyhemoglobin level less than 0.3 and oxygen saturation on that sample was 95.4% patient was given methylene blue with improvement of her cyanosis.  No exposure to well water.  No other recent changes in medications.  Patient then had a second episode of methemoglobinemia that was less severe a few weeks later and patient presented to [facility].  Symptoms resolved with conservative management and did not require methylene blue.  Azo was thought to be the culprit drug.  Patient was also seen for a second opinion with [facility] hematology.  Conservative management was recommended with avoidance of possible drugs that can induce methemoglobinemia and need for methylene blue as indicated for acute episodes.  Interval history-patient had another episode of methemoglobinemia couple of weeks ago.  She went to [facility] and received 2 doses of methylene below.  Currently feels fatigued.  Continues to have symptoms of pressure when she urinates.  She was given few days of fosfomycin when she was in [facility]  ECOG PS- 1 Pain scale- 0   Review of systems- Review of Systems  Constitutional:  Positive for malaise/fatigue. Negative for chills, fever and weight loss.  HENT:  Negative for congestion, ear discharge and nosebleeds.   Eyes:  Negative for blurred vision.  Respiratory:  Negative for cough, hemoptysis, sputum production, shortness of breath and wheezing.   Cardiovascular:  Negative for chest pain, palpitations, orthopnea and claudication.  Gastrointestinal:  Negative for abdominal pain, blood in stool, constipation, diarrhea, heartburn, melena, nausea and vomiting.  Genitourinary:  Positive for dysuria. Negative for flank pain, frequency, hematuria and urgency.  Musculoskeletal:  Negative for back pain, joint pain and myalgias.  Skin:  Negative for rash.  Neurological:  Negative for dizziness, tingling, focal weakness, seizures, weakness and  headaches.  Endo/Heme/Allergies:  Does not bruise/bleed easily.  Psychiatric/Behavioral:  Negative for depression and suicidal ideas. The patient does not have insomnia.       Allergies  Allergen Reactions   Cephalexin Anaphylaxis   Cephalosporins Anaphylaxis and Other (See Comments)   Macrobid [Nitrofurantoin Macrocrystal] Other (See Comments)    Causes methemoglobinemia   Penicillins Anaphylaxis and Other (See Comments)    Has patient had a PCN reaction causing immediate rash, facial/tongue/throat swelling, SOB or lightheadedness with hypotension: Yes Has patient had a PCN reaction causing severe rash involving mucus membranes or skin necrosis: No Has patient had a PCN reaction that required hospitalization Yes Has patient had a PCN reaction occurring within the last 10 years: No If all of the above answers are "NO", then may proceed with Cephalosporin use.    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     Past Medical History:  Diagnosis Date   Allergy    Anemia    Anxiety    Arthritis    OA Knee; non-specific autoimmune process followed by [facility]/Rheumatology.   Asthma    Blood transfusion without reported diagnosis YRS AGO   COVID-19 03/2018   Depression    Gastric ulcer, unspecified as acute or chronic, without mention of hemorrhage, perforation, or obstruction YRS AGO   GERD (gastroesophageal reflux disease)    Hyperlipidemia    Hypertension    Methemoglobinemia    Migraine    hx of migraines   Obesity, unspecified    Other chronic cystitis    Personal history of colonic polyps    Pneumonia    PONV (postoperative nausea and vomiting)    NAUSEA, SCOPOLAMINE PATCH HELPED WITH LAST SURGERIES   Pre-diabetes      Past Surgical History:  Procedure Laterality Date   ABDOMINAL HYSTERECTOMY  01/25/1994   uterine fibroids; DUB; cervical  dysplasia; ovaries resected two years later.     APPENDECTOMY     BILATERAL OOPHORECTOMY  2001   BREAST BIOPSY Left 10/08/2021   10:00 ribbon, [place] bx, path pending   CESAREAN SECTION     x 2   COLON SURGERY  YRS AGO   SIGMOIDECTOMY   Colonoscopy  09/08/2012   diverticulosis, hemorrhoids.  [name].  Symptoms:  anemia, hemoccult +.   ESOPHAGOGASTRODUODENOSCOPY  09/08/2012   +HH.  [name].  Symptoms: anemia, hemoccult +.   HIP ARTHROSCOPY Right    LAPAROSCOPIC GASTRIC SLEEVE RESECTION N/A 03/01/2016   Procedure: LAPAROSCOPIC GASTRIC SLEEVE RESECTION, UPPER ENDO;  Surgeon: Gaynelle Adu, MD;  Location: [facility];  Service: General;  Laterality: N/A;   LAPAROSCOPIC LYSIS OF ADHESIONS N/A 02/10/2016   Procedure: LAPAROSCOPIC LYSIS OF ADHESIONS;  Surgeon: Gaynelle Adu, MD;  Location: [facility];  Service: General;  Laterality: N/A;   LAPAROSCOPY N/A 02/10/2016   Procedure: LAPAROSCOPY DIAGNOSTIC;  Surgeon: Gaynelle Adu, MD;  Location: [facility];  Service: General;  Laterality: N/A;   NASAL SEPTUM SURGERY     NECK SURGERY     Rheumatology Consult  11/26/2011   multiple arthralgias. Autoimmune labs negative.  Rx for Plaquenil for non-specific autoimmune process.  Lavenia Atlas.   SHOULDER ARTHROSCOPY WITH OPEN ROTATOR CUFF REPAIR AND DISTAL CLAVICLE ACROMINECTOMY Right 01/13/2021  Procedure: SHOULDER ARTHROSCOPY WITH OPEN ROTATOR CUFF REPAIR AND DISTAL CLAVICLE ACROMINECTOMY;  Surgeon: Juanell Fairly, MD;  Location: [facility];  Service: Orthopedics;  Laterality: Right;   SHOULDER ARTHROSCOPY WITH SUBACROMIAL DECOMPRESSION Right 01/13/2021   Procedure: SHOULDER ARTHROSCOPY WITH SUBACROMIAL DECOMPRESSION, BICEPS TENOTOMY;  Surgeon: Juanell Fairly, MD;  Location: [facility];  Service: Orthopedics;  Laterality: Right;   SIGMOID RESECTION / RECTOPEXY     SPINE SURGERY     TONSILLECTOMY AND ADENOIDECTOMY     TOTAL HIP ARTHROPLASTY Right 09/27/2017   Procedure: TOTAL HIP ARTHROPLASTY ANTERIOR APPROACH;  Surgeon:  Kennedy Bucker, MD;  Location: [facility] OR[facility];  Service: Orthopedics;  Laterality: Right;   TOTAL HIP ARTHROPLASTY Left 12/27/2017   Procedure: TOTAL HIP ARTHROPLASTY ANTERIOR APPROACH;  Surgeon: Kennedy Bucker, MD;  Location: [facility] OR[facility];  Service: Orthopedics;  Laterality: Left;    Social History   Socioeconomic History   Marital status: Married    Spouse name: Not on file   Number of children: 2   Years of education: 12   Highest education level: Not on file  Occupational History   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    Employer: [employer]    Comment: Dermatology office  Tobacco Use   Smoking status: Former    Current packs/day: 1.00    Average packs/day: 1 pack/day for 5.0 years (5.0 ttl pk-yrs)    Types: Cigarettes    Passive exposure: Past   Smokeless tobacco: Never   Tobacco comments:    Quit 32 years ago  Vaping Use   Vaping status: Never Used  Substance and Sexual Activity   Alcohol use: Yes    Comment: rare   Drug use: No   Sexual activity: Yes    Birth control/protection: Post-menopausal, Surgical  Other Topics Concern   Not on file  Social History Narrative   Marital status: married since 32 years; happily married; no abuse.      Children: 2 sons (24, 70); 2 grandchildren  (5yo Atlast, 3 yo August)      Lives: with husband, [name]/son.      Employment: check in at [employer] in [place] x 2004; loves job.      Tobacco abuse:  Former smoker.      Alcohol:  None; holidays only      Exercise: none in 2017     Seatbelt: 100%; no texting   Social Determinants of Health   Financial Resource Strain: Low Risk  (07/21/2021)   Received from [facility], [facility]   Overall Financial Resource Strain (CARDIA)    Difficulty of Paying Living Expenses: Not hard at all  Food Insecurity: No Food Insecurity (07/21/2021)   Received from [facility], [facility]   Hunger Vital Sign    Worried About Running Out of Food in the Last  Year: Never true    Ran Out of Food in the Last Year: Never true  Transportation Needs: No Transportation Needs (07/21/2021)   Received from [facility]   [facility] - Transportation    Lack of Transportation (Medical): No    Lack of Transportation (Non-Medical): No  Physical Activity: Sufficiently Active (07/21/2021)   Received from [facility], [facility]   Exercise Vital Sign    Days of Exercise per Week: 5 days    Minutes of Exercise per Session: 30 min  Stress: Stress Concern Present (07/21/2021)   Received from [facility], [facility]  [employer] of Occupational Health - Occupational Stress Questionnaire    Feeling of Stress : To some extent  Social Connections: Socially Integrated (07/21/2021)   Received from [facility], [facility]   Social Connection and Isolation Panel [NHANES]    Frequency of Communication with Friends and Family: More than three times a week    Frequency of Social Gatherings with Friends and Family: More than three times a week    Attends Religious Services: 1 to 4 times per year    Active Member of [employer] or Organizations: Yes    Attends Banker Meetings: Never    Marital Status: Married  Catering manager Violence: Not on file    Family History  Problem Relation Age of Onset   Bipolar disorder Father    Transient ischemic attack Father    Dementia Father        secondary to head trauma   Hypertension Father    Diabetes Father    CAD Father    Cancer Mother 94       lung   Migraines Mother    Hypothyroidism Mother    Hypertension Sister    Atrial fibrillation Sister    Hypothyroidism Sister    Alcohol abuse Brother    Hypertension Brother    Alcohol abuse Brother    Atrial fibrillation Maternal Grandmother    COPD Maternal Grandmother    Diabetes Maternal Grandmother    Hyperlipidemia Maternal Grandmother    Diabetes Maternal Grandfather    Diabetes Paternal Grandfather     Heart disease Paternal Grandfather    Breast cancer Sister 44     Current Outpatient Medications:    folic acid (FOLVITE) 1 MG tablet, Take 1 tablet (1 mg total) by mouth daily., Disp: 30 tablet, Rfl: 3   azelastine (ASTELIN) 0.1 % nasal spray, Place 1-2 sprays into both nostrils 2 (two) times daily., Disp: 30 mL, Rfl: 12   budesonide-formoterol (SYMBICORT) 160-4.5 MCG/ACT inhaler, Inhale 2 puffs into the lungs 2 (two) times daily., Disp: 10.2 g, Rfl: 5   busPIRone (BUSPAR) 15 MG tablet, Take 1 tablet (15 mg total) by mouth 3 (three) times daily., Disp: 270 tablet, Rfl: 3   DULoxetine (CYMBALTA) 60 MG capsule, Take 1 capsule (60 mg total) by mouth 2 (two) times daily., Disp: 180 capsule, Rfl: 4   dutasteride (AVODART) 0.5 MG capsule, Take 1 capsule (0.5 mg total) by mouth daily., Disp: 30 capsule, Rfl: 6   estradiol (ESTRACE) 0.1 MG/GM vaginal cream, Place 2 g vaginally twice a week, Disp: 42.5 g, Rfl: 4   fluticasone furoate-vilanterol (BREO ELLIPTA) 200-25 MCG/ACT AEPB, Inhale 1 puff into the lungs daily., Disp: 60 each, Rfl: 5   gabapentin (NEURONTIN) 300 MG capsule, Take 1-3 capsules (300-900 mg total) by mouth 3 (three) times daily., Disp: 810 capsule, Rfl: 2   [START ON 12/16/2022] HYDROcodone-acetaminophen (NORCO) 10-325 MG tablet, Take 1 tablet by mouth every 4 (four) hours as needed for pain., Disp: 150 tablet, Rfl: 0   HYDROcodone-acetaminophen (NORCO) 10-325 MG tablet, Take 1 tablet by mouth every 4 (four) hours as needed for pain., Disp: 150 tablet, Rfl: 0   [START ON 01/13/2023] HYDROcodone-acetaminophen (NORCO) 10-325 MG tablet, Take 1 tablet by mouth every 4 (four) hours as needed for pain., Disp: 150 tablet, Rfl: 0   ketoconazole (NIZORAL) 2 % shampoo, Apply 1 Application topically 3 (three) times a week. Massage into scalp and leave in for 10 minutes before rinsing out, Disp: 120  mL, Rfl: 11   levocetirizine (XYZAL) 5 MG tablet, Take 1 tablet (5 mg total) by mouth every evening,  Disp: 90 tablet, Rfl: 2   methocarbamol (ROBAXIN) 750 MG tablet, Take 750 mg by mouth 3 (three) times daily as needed for muscle spasms., Disp: , Rfl:    minoxidil (LONITEN) 2.5 MG tablet, Take 0.5 tablets (1.25 mg total) by mouth daily., Disp: 30 tablet, Rfl: 6   mirabegron ER (MYRBETRIQ) 50 MG TB24 tablet, Take 1 tablet (50 mg total) by mouth daily., Disp: 90 tablet, Rfl: 3   montelukast (SINGULAIR) 10 MG tablet, Take 1 tablet (10 mg total) by mouth at bedtime, Disp: 90 tablet, Rfl: 2   mupirocin ointment (BACTROBAN) 2 %, Apply 1 Application topically 2 (two) times daily. To corners of mouth, Disp: 22 g, Rfl: 2   nitrofurantoin, macrocrystal-monohydrate, (MACROBID) 100 MG capsule, Take 1 capsule (100 mg total) by mouth daily as needed (after intercourse)., Disp: 60 capsule, Rfl: 1   omeprazole (PRILOSEC) 40 MG capsule, Take 1 capsule (40 mg total) by mouth daily., Disp: 100 capsule, Rfl: 3   phenazopyridine (PYRIDIUM) 100 MG tablet, Take 1 tablet (100 mg total) by mouth 2 (two) times daily as needed for pain., Disp: 20 tablet, Rfl: 6   rosuvastatin (CRESTOR) 5 MG tablet, Take 1 tablet (5 mg total) by mouth daily., Disp: 90 tablet, Rfl: 3   Semaglutide,0.25 or 0.5MG /DOS, (OZEMPIC, 0.25 OR 0.5 MG/DOSE,) 2 MG/3ML SOPN, Inject 0.5 mg into the skin once a week., Disp: 3 mL, Rfl: 5  Physical exam:  Vitals:   11/29/22 0906  BP: (!) 107/54  Pulse: 70  Resp: 18  Temp: (!) 97.3 F (36.3 C)  TempSrc: Tympanic  SpO2: 100%  Weight: 175 lb 9.6 oz (79.7 kg)  Height: 5\' 7"  (1.702 m)   Physical Exam Cardiovascular:     Rate and Rhythm: Normal rate and regular rhythm.     Heart sounds: Normal heart sounds.  Pulmonary:     Effort: Pulmonary effort is normal.     Breath sounds: Normal breath sounds.  Skin:    General: Skin is warm and dry.  Neurological:     Mental Status: She is alert and oriented to person, place, and time.         Latest Ref Rng & Units 11/23/2022    9:17 AM  CMP  Glucose  70 - 99 mg/dL 86   BUN 8 - 23 mg/dL 20   Creatinine 1.61 - 1.00 mg/dL 0.96   Sodium 045 - 409 mmol/L 137   Potassium 3.5 - 5.1 mmol/L 4.3   Chloride 98 - 111 mmol/L 103   CO2 22 - 32 mmol/L 25   Calcium 8.9 - 10.3 mg/dL 9.3       Latest Ref Rng & Units 11/23/2022    9:17 AM  CBC  WBC 4.0 - 10.5 K/uL 8.5   Hemoglobin 12.0 - 15.0 g/dL 7.6   Hematocrit 81.1 - 46.0 % 23.5   Platelets 150 - 400 K/uL 246     No images are attached to the encounter.  DG Chest 2 View  Result Date: 11/15/2022 CLINICAL DATA:  Low oxygen saturation. EXAM: CHEST - 2 VIEW COMPARISON:  10/06/2021 FINDINGS: The cardiomediastinal contours are normal. The lungs are clear. Pulmonary vasculature is normal. No consolidation, pleural effusion, or pneumothorax. No acute osseous abnormalities are seen. IMPRESSION: No active cardiopulmonary disease. Electronically Signed   By: Narda Rutherford M.D.   On: 11/15/2022 20:07  Assessment and plan- Patient is a 67 y.o. female with history of drug-induced methemoglobinemia here for routine follow-up  Methemoglobinemia: Patient received 2 doses of methylene blue at [facility] past week.  Her hemoglobin which at baseline runs between 11-12 is down to 7.6 likely due to hemolysis.  Haptoglobin is low iron studies B12 and folate are unremarkable.  I will continue to monitor her CBC every 2 to 3 weeks for now.  I have started her on oral folic acid.  If she were to have any future episodes of cyanosis or methemoglobin levels need to be checked and she needs to be considered for methylene blue as appropriate.  Recurrent UTI: I have checkedUrinalysis and urine culture for symptoms of urinary pressure on 11/23/2022.  Urine culture did not show any significant growth and therefore I am not starting her on antibiotics.  I am forwarding my note to Dr. Perley Jain so urology can follow her and treat her as appropriate.  Patient is allergic to multiple drugs as mentioned below:  Levaquin  which causes drug-induced hemolysis Penicillin and cephalosporins for which she gets anaphylaxis Azo and nitrofurantoin both of which cause methemoglobinemia Patient was recently prescribed Septra and for her UTI by urology and that was likely the culprit drug for her most recent episode of methemoglobinemia.  Patient needs to avoid all the about drugs in the future  I will tentatively see her back in 3 months with labs   Visit Diagnosis 1. Methemoglobinemia   2. Other fatigue      Dr. Owens Shark, MD, MPH [facility] [phone] 11/29/2022 12:00 PM

## 2022-12-02 ENCOUNTER — Encounter: Payer: Self-pay | Admitting: Oncology

## 2022-12-02 ENCOUNTER — Telehealth: Payer: Self-pay | Admitting: *Deleted

## 2022-12-02 NOTE — Telephone Encounter (Signed)
Dr. Smith Robert sent me a secure chat about the patient and her symptoms and she left a message on the patient's voicemail because she could not get in touch with her but she did tell her to go to the ER.  I waited a little bit and got the patient on the phone just now and she is having her sister come over and take her to the ER as Dr. Smith Robert has suggested

## 2022-12-03 ENCOUNTER — Inpatient Hospital Stay: Payer: PPO

## 2022-12-03 ENCOUNTER — Other Ambulatory Visit: Payer: Self-pay | Admitting: *Deleted

## 2022-12-03 ENCOUNTER — Telehealth: Payer: Self-pay | Admitting: *Deleted

## 2022-12-03 DIAGNOSIS — D749 Methemoglobinemia, unspecified: Secondary | ICD-10-CM

## 2022-12-03 DIAGNOSIS — R5383 Other fatigue: Secondary | ICD-10-CM

## 2022-12-03 LAB — COMPREHENSIVE METABOLIC PANEL
ALT: 11 U/L (ref 0–44)
AST: 17 U/L (ref 15–41)
Albumin: 4.3 g/dL (ref 3.5–5.0)
Alkaline Phosphatase: 47 U/L (ref 38–126)
Anion gap: 7 (ref 5–15)
BUN: 16 mg/dL (ref 8–23)
CO2: 22 mmol/L (ref 22–32)
Calcium: 8.8 mg/dL — ABNORMAL LOW (ref 8.9–10.3)
Chloride: 108 mmol/L (ref 98–111)
Creatinine, Ser: 2.17 mg/dL — ABNORMAL HIGH (ref 0.44–1.00)
GFR, Estimated: 24 mL/min — ABNORMAL LOW (ref >60)
Glucose, Bld: 81 mg/dL (ref 70–99)
Potassium: 3.6 mmol/L (ref 3.5–5.1)
Sodium: 137 mmol/L (ref 135–145)
Total Bilirubin: 1.3 mg/dL — ABNORMAL HIGH (ref <1.2)
Total Protein: 5.1 g/dL — ABNORMAL LOW (ref 6.5–8.1)

## 2022-12-03 LAB — CBC WITH DIFFERENTIAL/PLATELET
Abs Immature Granulocytes: 0.07 10*3/uL (ref 0.00–0.07)
Basophils Absolute: 0 10*3/uL (ref 0.0–0.1)
Basophils Relative: 1 %
Eosinophils Absolute: 0.2 10*3/uL (ref 0.0–0.5)
Eosinophils Relative: 3 %
HCT: 28.8 % — ABNORMAL LOW (ref 36.0–46.0)
Hemoglobin: 8.9 g/dL — ABNORMAL LOW (ref 12.0–15.0)
Immature Granulocytes: 1 %
Lymphocytes Relative: 22 %
Lymphs Abs: 1.4 10*3/uL (ref 0.7–4.0)
MCH: 33.5 pg (ref 26.0–34.0)
MCHC: 30.9 g/dL (ref 30.0–36.0)
MCV: 108.3 fL — ABNORMAL HIGH (ref 80.0–100.0)
Monocytes Absolute: 0.4 10*3/uL (ref 0.1–1.0)
Monocytes Relative: 6 %
Neutro Abs: 4.4 10*3/uL (ref 1.7–7.7)
Neutrophils Relative %: 67 %
Platelets: 308 10*3/uL (ref 150–400)
RBC: 2.66 MIL/uL — ABNORMAL LOW (ref 3.87–5.11)
RDW: 13.3 % (ref 11.5–15.5)
WBC: 6.5 10*3/uL (ref 4.0–10.5)
nRBC: 0 % (ref 0.0–0.2)

## 2022-12-03 LAB — DIRECT ANTIGLOBULIN TEST (NOT AT ARMC)
DAT, IgG: NEGATIVE
DAT, complement: NEGATIVE

## 2022-12-03 LAB — RETICULOCYTES
Immature Retic Fract: 26.5 % — ABNORMAL HIGH (ref 2.3–15.9)
RBC.: 2.65 MIL/uL — ABNORMAL LOW (ref 3.87–5.11)
Retic Count, Absolute: 254.1 10*3/uL — ABNORMAL HIGH (ref 19.0–186.0)
Retic Ct Pct: 9.6 % — ABNORMAL HIGH (ref 0.4–3.1)

## 2022-12-03 LAB — VITAMIN B12: Vitamin B-12: 907 pg/mL (ref 180–914)

## 2022-12-03 NOTE — Telephone Encounter (Signed)
Patient came In for labs this afternoon, results are pending

## 2022-12-03 NOTE — Telephone Encounter (Signed)
Patient called reporting that she got Dr Assunta Gambles voice mail to go to ER, but she did not go. She states that she went home and went to bed and is asking to come to our office to have labs checked before she goes to ER. Please advise

## 2022-12-05 ENCOUNTER — Encounter: Payer: Self-pay | Admitting: *Deleted

## 2022-12-05 LAB — HAPTOGLOBIN: Haptoglobin: <10 mg/dL — ABNORMAL LOW (ref 37–355)

## 2022-12-06 ENCOUNTER — Inpatient Hospital Stay: Payer: PPO

## 2022-12-06 VITALS — BP 161/81 | HR 90 | Temp 96.1°F | Resp 20

## 2022-12-06 DIAGNOSIS — D749 Methemoglobinemia, unspecified: Secondary | ICD-10-CM

## 2022-12-06 DIAGNOSIS — R5383 Other fatigue: Secondary | ICD-10-CM

## 2022-12-06 LAB — METHEMOGLOBIN, BLOOD: Methemoglobin, Blood: 1.7 % — ABNORMAL HIGH (ref 0.4–1.5)

## 2022-12-06 MED ORDER — DEXAMETHASONE SODIUM PHOSPHATE 10 MG/ML IJ SOLN
10.0000 mg | Freq: Once | INTRAMUSCULAR | Status: AC
  Administered 2022-12-06: 10 mg via INTRAVENOUS
  Filled 2022-12-06: qty 1

## 2022-12-06 MED ORDER — SODIUM CHLORIDE 0.9 % IV SOLN
INTRAVENOUS | Status: DC
  Filled 2022-12-06 (×2): qty 250

## 2022-12-06 MED ORDER — SODIUM CHLORIDE 0.9 % IV SOLN: 10.0000 mg | Freq: Once | INTRAVENOUS | Status: DC

## 2022-12-07 ENCOUNTER — Other Ambulatory Visit: Payer: Self-pay | Admitting: Family Medicine

## 2022-12-07 DIAGNOSIS — Z1231 Encounter for screening mammogram for malignant neoplasm of breast: Secondary | ICD-10-CM

## 2022-12-09 ENCOUNTER — Encounter: Payer: Self-pay | Admitting: Oncology

## 2022-12-10 ENCOUNTER — Inpatient Hospital Stay: Payer: PPO

## 2022-12-10 DIAGNOSIS — D749 Methemoglobinemia, unspecified: Secondary | ICD-10-CM

## 2022-12-10 DIAGNOSIS — Z1231 Encounter for screening mammogram for malignant neoplasm of breast: Secondary | ICD-10-CM

## 2022-12-10 LAB — CBC
HCT: 27.8 % — ABNORMAL LOW (ref 36.0–46.0)
Hemoglobin: 8.5 g/dL — ABNORMAL LOW (ref 12.0–15.0)
MCH: 33.1 pg (ref 26.0–34.0)
MCHC: 30.6 g/dL (ref 30.0–36.0)
MCV: 108.2 fL — ABNORMAL HIGH (ref 80.0–100.0)
Platelets: 402 10*3/uL — ABNORMAL HIGH (ref 150–400)
RBC: 2.57 MIL/uL — ABNORMAL LOW (ref 3.87–5.11)
RDW: 12.5 % (ref 11.5–15.5)
WBC: 5.1 10*3/uL (ref 4.0–10.5)
nRBC: 0 % (ref 0.0–0.2)

## 2022-12-10 LAB — BASIC METABOLIC PANEL
Anion gap: 10 (ref 5–15)
BUN: 31 mg/dL — ABNORMAL HIGH (ref 8–23)
CO2: 22 mmol/L (ref 22–32)
Calcium: 9.3 mg/dL (ref 8.9–10.3)
Chloride: 106 mmol/L (ref 98–111)
Creatinine, Ser: 1.3 mg/dL — ABNORMAL HIGH (ref 0.44–1.00)
GFR, Estimated: 45 mL/min — ABNORMAL LOW (ref >60)
Glucose, Bld: 100 mg/dL — ABNORMAL HIGH (ref 70–99)
Potassium: 3.8 mmol/L (ref 3.5–5.1)
Sodium: 138 mmol/L (ref 135–145)

## 2022-12-13 ENCOUNTER — Encounter: Payer: Self-pay | Admitting: Oncology

## 2022-12-13 ENCOUNTER — Other Ambulatory Visit: Payer: PPO

## 2022-12-16 ENCOUNTER — Other Ambulatory Visit: Payer: Self-pay

## 2022-12-16 MED ORDER — OZEMPIC (1 MG/DOSE) 4 MG/3ML ~~LOC~~ SOPN
1.0000 mg | PEN_INJECTOR | SUBCUTANEOUS | 3 refills | Status: DC
  Filled 2022-12-16: qty 3, 28d supply, fill #0
  Filled 2023-01-20: qty 3, 28d supply, fill #1
  Filled 2023-02-10: qty 3, 28d supply, fill #2
  Filled 2023-03-11: qty 3, 28d supply, fill #3

## 2022-12-28 ENCOUNTER — Other Ambulatory Visit: Payer: Self-pay

## 2022-12-28 MED ORDER — DUTASTERIDE 0.5 MG PO CAPS
0.5000 mg | ORAL_CAPSULE | Freq: Every day | ORAL | 6 refills | Status: DC
  Filled 2022-12-28 – 2023-01-04 (×2): qty 30, 30d supply, fill #0
  Filled 2023-02-17: qty 30, 30d supply, fill #1
  Filled 2023-03-29 – 2023-04-01 (×2): qty 30, 30d supply, fill #2
  Filled 2023-05-10 – 2023-05-24 (×2): qty 30, 30d supply, fill #3
  Filled 2023-06-24: qty 30, 30d supply, fill #4
  Filled 2023-07-26: qty 30, 30d supply, fill #5
  Filled 2023-08-30: qty 30, 30d supply, fill #6

## 2022-12-28 MED ORDER — MINOXIDIL 2.5 MG PO TABS
1.2500 mg | ORAL_TABLET | Freq: Every day | ORAL | 6 refills | Status: DC
  Filled 2022-12-29 – 2022-12-31 (×2): qty 30, 60d supply, fill #0
  Filled 2023-01-21: qty 30, 60d supply, fill #1
  Filled 2023-02-22: qty 22, 44d supply, fill #1
  Filled 2023-02-22: qty 8, 16d supply, fill #1
  Filled 2023-04-29: qty 30, 60d supply, fill #2
  Filled 2023-06-15: qty 30, 60d supply, fill #3
  Filled 2023-08-30: qty 30, 60d supply, fill #4

## 2022-12-29 ENCOUNTER — Other Ambulatory Visit: Payer: Self-pay

## 2022-12-30 ENCOUNTER — Other Ambulatory Visit: Payer: Self-pay

## 2022-12-31 ENCOUNTER — Other Ambulatory Visit: Payer: Self-pay

## 2023-01-03 ENCOUNTER — Inpatient Hospital Stay: Payer: PPO | Attending: Oncology

## 2023-01-03 DIAGNOSIS — D749 Methemoglobinemia, unspecified: Secondary | ICD-10-CM | POA: Insufficient documentation

## 2023-01-03 DIAGNOSIS — R5383 Other fatigue: Secondary | ICD-10-CM

## 2023-01-03 LAB — CBC
HCT: 39.3 % (ref 36.0–46.0)
Hemoglobin: 12.6 g/dL (ref 12.0–15.0)
MCH: 32.5 pg (ref 26.0–34.0)
MCHC: 32.1 g/dL (ref 30.0–36.0)
MCV: 101.3 fL — ABNORMAL HIGH (ref 80.0–100.0)
Platelets: 275 10*3/uL (ref 150–400)
RBC: 3.88 MIL/uL (ref 3.87–5.11)
RDW: 12.1 % (ref 11.5–15.5)
WBC: 5.4 10*3/uL (ref 4.0–10.5)
nRBC: 0 % (ref 0.0–0.2)

## 2023-01-03 LAB — COMPREHENSIVE METABOLIC PANEL
ALT: 16 U/L (ref 0–44)
AST: 17 U/L (ref 15–41)
Albumin: 4.4 g/dL (ref 3.5–5.0)
Alkaline Phosphatase: 45 U/L (ref 38–126)
Anion gap: 10 (ref 5–15)
BUN: 11 mg/dL (ref 8–23)
CO2: 24 mmol/L (ref 22–32)
Calcium: 9.8 mg/dL (ref 8.9–10.3)
Chloride: 103 mmol/L (ref 98–111)
Creatinine, Ser: 0.44 mg/dL (ref 0.44–1.00)
GFR, Estimated: >60 mL/min (ref >60)
Glucose, Bld: 99 mg/dL (ref 70–99)
Potassium: 3.6 mmol/L (ref 3.5–5.1)
Sodium: 137 mmol/L (ref 135–145)
Total Bilirubin: 0.8 mg/dL (ref <1.2)
Total Protein: 7.2 g/dL (ref 6.5–8.1)

## 2023-01-03 LAB — RETICULOCYTES
Immature Retic Fract: 7.3 % (ref 2.3–15.9)
RBC.: 3.86 MIL/uL — ABNORMAL LOW (ref 3.87–5.11)
Retic Count, Absolute: 83.8 10*3/uL (ref 19.0–186.0)
Retic Ct Pct: 2.2 % (ref 0.4–3.1)

## 2023-01-03 LAB — VITAMIN B12: Vitamin B-12: 505 pg/mL (ref 180–914)

## 2023-01-04 ENCOUNTER — Other Ambulatory Visit: Payer: PPO

## 2023-01-04 ENCOUNTER — Ambulatory Visit: Payer: PPO | Admitting: Oncology

## 2023-01-04 ENCOUNTER — Other Ambulatory Visit: Payer: Self-pay

## 2023-01-09 ENCOUNTER — Other Ambulatory Visit: Payer: Self-pay

## 2023-01-10 ENCOUNTER — Other Ambulatory Visit: Payer: Self-pay

## 2023-01-10 MED ORDER — PROMETHAZINE HCL 12.5 MG PO TABS
12.5000 mg | ORAL_TABLET | Freq: Three times a day (TID) | ORAL | 1 refills | Status: DC | PRN
  Filled 2023-01-10: qty 30, 10d supply, fill #0
  Filled 2023-01-21 – 2023-03-09 (×2): qty 30, 10d supply, fill #1

## 2023-01-11 ENCOUNTER — Other Ambulatory Visit: Payer: Self-pay

## 2023-01-12 ENCOUNTER — Other Ambulatory Visit: Payer: Self-pay

## 2023-01-13 ENCOUNTER — Other Ambulatory Visit: Payer: Self-pay | Admitting: Neurology

## 2023-01-13 ENCOUNTER — Other Ambulatory Visit: Payer: Self-pay

## 2023-01-13 DIAGNOSIS — G3184 Mild cognitive impairment, so stated: Secondary | ICD-10-CM

## 2023-01-20 ENCOUNTER — Other Ambulatory Visit: Payer: Self-pay

## 2023-01-21 ENCOUNTER — Ambulatory Visit: Admission: RE | Admit: 2023-01-21 | Payer: PPO | Source: Ambulatory Visit

## 2023-01-21 ENCOUNTER — Other Ambulatory Visit: Payer: Self-pay

## 2023-01-25 ENCOUNTER — Ambulatory Visit
Admission: RE | Admit: 2023-01-25 | Discharge: 2023-01-25 | Disposition: A | Discharge status: Home / Self Care | Payer: PPO | Source: Ambulatory Visit | Attending: Neurology | Admitting: Neurology

## 2023-01-25 ENCOUNTER — Ambulatory Visit
Admission: RE | Admit: 2023-01-25 | Discharge: 2023-01-25 | Disposition: A | Discharge status: Home / Self Care | Payer: PPO | Source: Ambulatory Visit | Attending: Family Medicine | Admitting: Family Medicine

## 2023-01-25 DIAGNOSIS — G3184 Mild cognitive impairment, so stated: Secondary | ICD-10-CM

## 2023-01-25 DIAGNOSIS — Z1231 Encounter for screening mammogram for malignant neoplasm of breast: Secondary | ICD-10-CM | POA: Diagnosis present

## 2023-01-27 ENCOUNTER — Other Ambulatory Visit: Payer: Self-pay

## 2023-02-07 ENCOUNTER — Other Ambulatory Visit: Payer: Self-pay

## 2023-02-07 DIAGNOSIS — D599 Acquired hemolytic anemia, unspecified: Secondary | ICD-10-CM

## 2023-02-08 ENCOUNTER — Inpatient Hospital Stay: Payer: PPO | Attending: Oncology

## 2023-02-08 ENCOUNTER — Other Ambulatory Visit: Payer: Self-pay

## 2023-02-08 ENCOUNTER — Inpatient Hospital Stay: Payer: PPO | Admitting: Oncology

## 2023-02-09 ENCOUNTER — Other Ambulatory Visit: Payer: Self-pay

## 2023-02-09 MED ORDER — HYDROCODONE-ACETAMINOPHEN 10-325 MG PO TABS
1.0000 | ORAL_TABLET | ORAL | 0 refills | Status: AC | PRN
  Filled 2023-02-09: qty 150, 25d supply, fill #0

## 2023-02-14 ENCOUNTER — Ambulatory Visit: Payer: PPO | Admitting: Dermatology

## 2023-02-14 DIAGNOSIS — M67449 Ganglion, unspecified hand: Secondary | ICD-10-CM

## 2023-02-14 DIAGNOSIS — M67442 Ganglion, left hand: Secondary | ICD-10-CM | POA: Diagnosis not present

## 2023-02-14 MED ORDER — TRIAMCINOLONE ACETONIDE 10 MG/ML IJ SUSP
5.0000 mg | Freq: Once | INTRAMUSCULAR | Status: AC
  Administered 2023-02-14: 5 mg

## 2023-02-14 NOTE — Progress Notes (Signed)
   Follow-Up Visit   Subjective  Bethany Scott is a 68 y.o. female who presents for the following: Bx proven Digital mucous cyst, L 3rd digit, pt noticed came back 2 days ago, sore, applying mupirocin oint qd The patient has spots, moles and lesions to be evaluated, some may be new or changing and the patient may have concern these could be cancer.   The following portions of the chart were reviewed this encounter and updated as appropriate: medications, allergies, medical history  Review of Systems:  No other skin or systemic complaints except as noted in HPI or Assessment and Plan.  Objective  Well appearing patient in no apparent distress; mood and affect are within normal limits.    A focused examination was performed of the following areas: L hand  Relevant exam findings are noted in the Assessment and Plan.  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 Intralesional injection - L 3rd digit proximal nail fold Location: L 3rd digit  Informed Consent: Discussed risks (infection, pain, bleeding, bruising, thinning of the skin, loss of skin pigment, lack of resolution, and recurrence of lesion) and benefits of the procedure, as well as the alternatives. Informed consent was obtained. Preparation: The area was prepared a standard fashion.  Procedure Details: An intralesional injection was performed with Kenalog 5 mg/cc. 0.1 cc in total were injected.  Total number of injections: 1  Plan: The patient was instructed on post-op care. Recommend OTC analgesia as needed for pain.  Lot [id number] exp 04/2024 Related Medications triamcinolone acetonide (KENALOG) 10 MG/ML injection 5 mg   Return if symptoms worsen or fail to  improve.  I, Ardis Rowan, RMA, am acting as scribe for Willeen Niece, MD .   Documentation: I have reviewed the above documentation for accuracy and completeness, and I agree with the above.  Willeen Niece, MD

## 2023-02-14 NOTE — Patient Instructions (Signed)

## 2023-02-17 ENCOUNTER — Other Ambulatory Visit: Payer: Self-pay

## 2023-02-22 ENCOUNTER — Other Ambulatory Visit: Payer: Self-pay

## 2023-03-02 ENCOUNTER — Inpatient Hospital Stay: Payer: PPO | Admitting: Oncology

## 2023-03-02 ENCOUNTER — Inpatient Hospital Stay: Payer: PPO | Attending: Oncology

## 2023-03-02 ENCOUNTER — Encounter: Payer: Self-pay | Admitting: Oncology

## 2023-03-02 VITALS — BP 131/71 | HR 75 | Temp 97.1°F | Resp 18 | Wt 159.9 lb

## 2023-03-02 DIAGNOSIS — D749 Methemoglobinemia, unspecified: Secondary | ICD-10-CM | POA: Insufficient documentation

## 2023-03-02 DIAGNOSIS — Z801 Family history of malignant neoplasm of trachea, bronchus and lung: Secondary | ICD-10-CM | POA: Diagnosis not present

## 2023-03-02 DIAGNOSIS — Z79899 Other long term (current) drug therapy: Secondary | ICD-10-CM | POA: Insufficient documentation

## 2023-03-02 DIAGNOSIS — Z803 Family history of malignant neoplasm of breast: Secondary | ICD-10-CM | POA: Insufficient documentation

## 2023-03-02 DIAGNOSIS — D599 Acquired hemolytic anemia, unspecified: Secondary | ICD-10-CM

## 2023-03-02 LAB — CBC (CANCER CENTER ONLY)
HCT: 42.2 % (ref 36.0–46.0)
Hemoglobin: 13.9 g/dL (ref 12.0–15.0)
MCH: 30.5 pg (ref 26.0–34.0)
MCHC: 32.9 g/dL (ref 30.0–36.0)
MCV: 92.7 fL (ref 80.0–100.0)
Platelet Count: 377 10*3/uL (ref 150–400)
RBC: 4.55 MIL/uL (ref 3.87–5.11)
RDW: 12.2 % (ref 11.5–15.5)
WBC Count: 6.5 10*3/uL (ref 4.0–10.5)
nRBC: 0 % (ref 0.0–0.2)

## 2023-03-02 LAB — VITAMIN B12: Vitamin B-12: 612 pg/mL (ref 180–914)

## 2023-03-02 NOTE — Progress Notes (Signed)
 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  She also has a history of iron deficiency and folate acid deficiency.  Patient was admitted for bilateral pneumonia in August 2022 at that time she was treated with antibiotics.  At that time she was evaluated for a hemoglobin of 6.6 and underwent a comprehensive work-up which showed normal ferritin of 183 with a normal iron saturation folate level normal at 18.8 B12 levels normal at 494 immunoglobulins were normal haptoglobin was low initially at less than 10 and subsequently in May 2023 it was still low at 16 LDH has been normal.  She has had G6PD levels checked 3 different occasions over the last 1 month and they have been within normal limits.  Coombs test in April 2023 was negative.  PNH profile testing negative.  Her hemoglobin which was 6.6 in August 2022 subsequently recovered to 10.5-11 over the next 6 months.  Hemoglobinopathy evaluation negative. 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 She did not require steroids or other treatment for her hemolytic anemia.    Patient presented to the ER with symptoms of shortness of breath and her lips turning purple in June 2023.  She was given Azo and Macrobid  recently for UTI.Patient had a coexemetry panel performed in the hospital which  showed an elevated methemoglobin level of 24.7%.  Carboxyhemoglobin level less than 0.3 and oxygen  saturation on that sample was 95.4% patient was given methylene blue  with improvement of her cyanosis.  No exposure to well water .  No other recent changes in medications.  Patient then had a second episode of methemoglobinemia that was less severe a few weeks later and patient presented to [facility].  Symptoms resolved with conservative management and did not require methylene blue .  Azo was thought to be the culprit drug.  Patient was also seen for a second opinion with [facility] hematology.  Conservative management was recommended with avoidance of possible drugs that can induce methemoglobinemia and need for methylene blue  as indicated for acute episodes.  Levaquin  which causes drug-induced hemolysis Penicillin and cephalosporins for which she gets anaphylaxis Bactrim , Azo and nitrofurantoin  both of which cause methemoglobinemia   Interval history-presently patient is doing well and focusing on losing more weight and get healthier.  She has not had any episodes of methemoglobinemia since October.  She is presently on Z-Pak for possible dental infection  ECOG PS- 0 Pain scale- 0   Review of systems- Review of Systems  Constitutional:  Negative for chills, fever, malaise/fatigue and weight loss.  HENT:  Negative for congestion, ear discharge and nosebleeds.   Eyes:  Negative for blurred vision.  Respiratory:  Negative for cough, hemoptysis, sputum production, shortness of breath and wheezing.   Cardiovascular:  Negative for chest pain, palpitations, orthopnea and claudication.  Gastrointestinal:  Negative for abdominal pain, blood in stool, constipation, diarrhea, heartburn, melena, nausea and vomiting.  Genitourinary:  Negative for dysuria, flank pain, frequency,  hematuria and urgency.  Musculoskeletal:  Negative for back pain, joint pain and myalgias.  Skin:  Negative for rash.  Neurological:  Negative  for dizziness, tingling, focal weakness, seizures, weakness and headaches.  Endo/Heme/Allergies:  Does not bruise/bleed easily.  Psychiatric/Behavioral:  Negative for depression and suicidal ideas. The patient does not have insomnia.       Allergies  Allergen Reactions   Cephalexin Anaphylaxis   Cephalosporins Anaphylaxis and Other (See Comments)   Macrobid  [Nitrofurantoin  Macrocrystal] Other (See Comments)    Causes methemoglobinemia   Penicillins Anaphylaxis and Other (See Comments)    Has patient had a PCN reaction causing immediate rash, facial/tongue/throat swelling, SOB or lightheadedness with hypotension: Yes Has patient had a PCN reaction causing severe rash involving mucus membranes or skin necrosis: No Has patient had a PCN reaction that required hospitalization Yes Has patient had a PCN reaction occurring within the last 10 years: No If all of the above answers are NO, then may proceed with Cephalosporin use.    Phenazopyridine      Other reaction(s): Other (See Comments) Methemoglobinemia   Levaquin  [Levofloxacin ] Other (See Comments)    Hemolytic anemia due to Levaquin      Past Medical History:  Diagnosis Date   Allergy    Anemia    Anxiety    Arthritis    OA Knee; non-specific autoimmune process followed by [facility]/Rheumatology.   Asthma    Blood transfusion without reported diagnosis YRS AGO   COVID-19 03/2018   Depression    Gastric ulcer, unspecified as acute or chronic, without mention of hemorrhage, perforation, or obstruction YRS AGO   GERD (gastroesophageal reflux disease)    Hyperlipidemia    Hypertension    Methemoglobinemia    Migraine    hx of migraines   Obesity, unspecified    Other chronic cystitis    Personal history of colonic polyps    Pneumonia    PONV (postoperative nausea and vomiting)    NAUSEA, SCOPOLAMINE  PATCH HELPED WITH LAST SURGERIES   Pre-diabetes      Past Surgical History:  Procedure Laterality Date    ABDOMINAL HYSTERECTOMY  01/25/1994   uterine fibroids; DUB; cervical dysplasia; ovaries resected two years later.     APPENDECTOMY     BILATERAL OOPHORECTOMY  2001   BREAST BIOPSY Left 10/08/2021   10:00 ribbon, us  bx, path- FRAGMENTS OF BENIGN FIBROADENOMA. - BACKGROUND MAMMARY PARENCHYMA WITH FIBROCYSTIC CHANGES AND PSEUDOANGIOMATOUS STROMAL HYPERPLASIA. - CALCIFICATIONS ASSOCIATED WITH BENIGN MAMMARY ELEMENTS. - NEGATIVE FOR ATYPICAL PROLIFERATIVE BREAST DISEASE.   CESAREAN SECTION     x 2   COLON SURGERY  YRS AGO   SIGMOIDECTOMY   Colonoscopy  09/08/2012   diverticulosis, hemorrhoids.  [name].  Symptoms:  anemia, hemoccult +.   ESOPHAGOGASTRODUODENOSCOPY  09/08/2012   +HH.  [name].  Symptoms: anemia, hemoccult +.   HIP ARTHROSCOPY Right    LAPAROSCOPIC GASTRIC SLEEVE RESECTION N/A 03/01/2016   Procedure: LAPAROSCOPIC GASTRIC SLEEVE RESECTION, UPPER ENDO;  Surgeon: Camellia Blush, MD;  Location: [facility];  Service: General;  Laterality: N/A;   LAPAROSCOPIC LYSIS OF ADHESIONS N/A 02/10/2016   Procedure: LAPAROSCOPIC LYSIS OF ADHESIONS;  Surgeon: Camellia Blush, MD;  Location: [facility];  Service: General;  Laterality: N/A;   LAPAROSCOPY N/A 02/10/2016   Procedure: LAPAROSCOPY DIAGNOSTIC;  Surgeon: Camellia Blush, MD;  Location: [facility];  Service: General;  Laterality: N/A;   NASAL SEPTUM SURGERY     NECK SURGERY     Rheumatology Consult  11/26/2011  multiple arthralgias. Autoimmune labs negative.  Rx for Plaquenil for non-specific autoimmune process.  [facility].   SHOULDER ARTHROSCOPY WITH OPEN ROTATOR CUFF REPAIR AND DISTAL CLAVICLE ACROMINECTOMY Right 01/13/2021   Procedure: SHOULDER ARTHROSCOPY WITH OPEN ROTATOR CUFF REPAIR AND DISTAL CLAVICLE ACROMINECTOMY;  Surgeon: Marchia Drivers, MD;  Location: [facility];  Service: Orthopedics;  Laterality: Right;   SHOULDER ARTHROSCOPY WITH SUBACROMIAL DECOMPRESSION Right 01/13/2021   Procedure: SHOULDER ARTHROSCOPY WITH SUBACROMIAL DECOMPRESSION,  BICEPS TENOTOMY;  Surgeon: Marchia Drivers, MD;  Location: [facility];  Service: Orthopedics;  Laterality: Right;   SIGMOID RESECTION / RECTOPEXY     SPINE SURGERY     TONSILLECTOMY AND ADENOIDECTOMY     TOTAL HIP ARTHROPLASTY Right 09/27/2017   Procedure: TOTAL HIP ARTHROPLASTY ANTERIOR APPROACH;  Surgeon: Kathlynn Sharper, MD;  Location: [facility];  Service: Orthopedics;  Laterality: Right;   TOTAL HIP ARTHROPLASTY Left 12/27/2017   Procedure: TOTAL HIP ARTHROPLASTY ANTERIOR APPROACH;  Surgeon: Kathlynn Sharper, MD;  Location: [facility];  Service: Orthopedics;  Laterality: Left;    Social History   Socioeconomic History   Marital status: Married    Spouse name: Not on file   Number of children: 2   Years of education: 12   Highest education level: Not on file  Occupational History   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    Employer: [employer]    Comment: Dermatology office  Tobacco Use   Smoking status: Former    Current packs/day: 1.00    Average packs/day: 1 pack/day for 5.0 years (5.0 ttl pk-yrs)    Types: Cigarettes    Passive exposure: Past   Smokeless tobacco: Never   Tobacco comments:    Quit 32 years ago  Vaping Use   Vaping status: Never Used  Substance and Sexual Activity   Alcohol use: Yes    Comment: rare   Drug use: No   Sexual activity: Yes    Birth control/protection: Post-menopausal, Surgical  Other Topics Concern   Not on file  Social History Narrative   Marital status: married since 32 years; happily married; no abuse.      Children: 2 sons (24, 10); 2 grandchildren  (5yo Atlast, 3 yo August)      Lives: with husband, [name]/son.      Employment: check in at [employer] in [place] x 2004; loves job.      Tobacco abuse:  Former smoker.      Alcohol:  None; holidays only      Exercise: none in 2017     Seatbelt: 100%; no texting   Social Drivers of Corporate Investment Banker Strain: Low Risk  (07/21/2021)   Received from [facility], [facility]   Overall Financial Resource Strain (CARDIA)    Difficulty of Paying Living Expenses: Not hard at all  Food Insecurity: No Food Insecurity (07/21/2021)   Received from [facility], [facility]   Hunger Vital Sign    Worried About Running Out of Food in the Last Year: Never true    Ran Out of Food in the Last Year: Never true  Transportation Needs: No Transportation Needs (07/21/2021)   Received from [facility], [facility]   [facility] - Transportation    Lack of Transportation (Medical): No    Lack of Transportation (Non-Medical): No  Physical Activity: Sufficiently Active (07/21/2021)   [facility] from [facility], [facility]   Exercise Vital Sign  Days of Exercise per Week: 5 days    Minutes of Exercise per Session: 30 min  Stress: Stress Concern Present (07/21/2021)   Received from [facility], [facility] Occupational [facility] - Occupational Stress Questionnaire    Feeling of Stress : To some extent  Social Connections: Socially Integrated (07/21/2021)   Received from [facility], [facility]   Social Connection and Isolation Panel [NHANES]    Frequency of Communication with Friends and Family: More than three times a week    Frequency of Social Gatherings with Friends and Family: More than three times a week    Attends Religious Services: 1 to 4 times per year    Active Member of [employer] or Organizations: Yes    Attends Banker Meetings: Never    Marital Status: Married  Catering Manager Violence: Not on file    Family History  Problem Relation Age of Onset   Bipolar disorder Father    Transient ischemic attack Father    Dementia Father        secondary to head trauma   Hypertension Father    Diabetes Father    CAD Father    Cancer Mother 29       lung   Migraines Mother    Hypothyroidism Mother    Hypertension Sister    Atrial fibrillation Sister    Hypothyroidism  Sister    Alcohol abuse Brother    Hypertension Brother    Alcohol abuse Brother    Atrial fibrillation Maternal Grandmother    COPD Maternal Grandmother    Diabetes Maternal Grandmother    Hyperlipidemia Maternal Grandmother    Diabetes Maternal Grandfather    Diabetes Paternal Grandfather    Heart disease Paternal Grandfather    Breast cancer Sister 44     Current Outpatient Medications:    azithromycin  (ZITHROMAX ) 250 MG tablet, Take 250 mg by mouth as directed., Disp: , Rfl:    latanoprost  (XALATAN ) 0.005 % ophthalmic solution, SMARTSIG:In Eye(s), Disp: , Rfl:    azelastine  (ASTELIN ) 0.1 % nasal spray, Place 1-2 sprays into both nostrils 2 (two) times daily., Disp: 30 mL, Rfl: 12   budesonide -formoterol  (SYMBICORT ) 160-4.5 MCG/ACT inhaler, Inhale 2 puffs into the lungs 2 (two) times daily., Disp: 10.2 g, Rfl: 5   busPIRone  (BUSPAR ) 15 MG tablet, Take 1 tablet (15 mg total) by mouth 3 (three) times daily., Disp: 270 tablet, Rfl: 3   DULoxetine  (CYMBALTA ) 60 MG capsule, Take 1 capsule (60 mg total) by mouth 2 (two) times daily., Disp: 180 capsule, Rfl: 4   dutasteride  (AVODART ) 0.5 MG capsule, Take 1 capsule (0.5 mg total) by mouth daily., Disp: 30 capsule, Rfl: 6   estradiol  (ESTRACE ) 0.1 MG/GM vaginal cream, Place 2 g vaginally twice a week, Disp: 42.5 g, Rfl: 4   fluticasone  furoate-vilanterol (BREO ELLIPTA ) 200-25 MCG/ACT AEPB, Inhale 1 puff into the lungs daily., Disp: 60 each, Rfl: 5   folic acid  (FOLVITE ) 1 MG tablet, Take 1 tablet (1 mg total) by mouth daily., Disp: 30 tablet, Rfl: 3   gabapentin  (NEURONTIN ) 300 MG capsule, Take 1-3 capsules (300-900 mg total) by mouth 3 (three) times daily., Disp: 810 capsule, Rfl: 2   HYDROcodone -acetaminophen  (NORCO) 10-325 MG tablet, Take 1 tablet by mouth every 4 (four) hours as needed for pain., Disp: 150 tablet, Rfl: 0   HYDROcodone -acetaminophen  (NORCO) 10-325 MG tablet, Take 1 tablet by mouth every 4 (four) hours as needed for  pain.,  Disp: 150 tablet, Rfl: 0   HYDROcodone -acetaminophen  (NORCO) 10-325 MG tablet, Take 1 tablet by mouth every 4 (four) hours as needed for Pain., Disp: 150 tablet, Rfl: 0   ketoconazole  (NIZORAL ) 2 % shampoo, Apply 1 Application topically 3 (three) times a week. Massage into scalp and leave in for 10 minutes before rinsing out, Disp: 120 mL, Rfl: 11   levocetirizine (XYZAL ) 5 MG tablet, Take 1 tablet (5 mg total) by mouth every evening, Disp: 90 tablet, Rfl: 2   methocarbamol  (ROBAXIN ) 750 MG tablet, Take 750 mg by mouth 3 (three) times daily as needed for muscle spasms., Disp: , Rfl:    minoxidil  (LONITEN ) 2.5 MG tablet, Take 0.5 tablets (1.25 mg total) by mouth daily., Disp: 30 tablet, Rfl: 6   mirabegron  ER (MYRBETRIQ ) 50 MG TB24 tablet, Take 1 tablet (50 mg total) by mouth daily., Disp: 90 tablet, Rfl: 3   montelukast  (SINGULAIR ) 10 MG tablet, Take 1 tablet (10 mg total) by mouth at bedtime, Disp: 90 tablet, Rfl: 2   mupirocin  ointment (BACTROBAN ) 2 %, Apply 1 Application topically 2 (two) times daily. To corners of mouth, Disp: 22 g, Rfl: 2   nitrofurantoin , macrocrystal-monohydrate, (MACROBID ) 100 MG capsule, Take 1 capsule (100 mg total) by mouth daily as needed (after intercourse)., Disp: 60 capsule, Rfl: 1   omeprazole  (PRILOSEC) 40 MG capsule, Take 1 capsule (40 mg total) by mouth daily., Disp: 100 capsule, Rfl: 3   phenazopyridine  (PYRIDIUM ) 100 MG tablet, Take 1 tablet (100 mg total) by mouth 2 (two) times daily as needed for pain., Disp: 20 tablet, Rfl: 6   promethazine  (PHENERGAN ) 12.5 MG tablet, Take 1 tablet (12.5 mg total) by mouth every 8 (eight) hours as needed for nausea., Disp: 30 tablet, Rfl: 1   rosuvastatin  (CRESTOR ) 5 MG tablet, Take 1 tablet (5 mg total) by mouth daily., Disp: 90 tablet, Rfl: 3   Semaglutide , 1 MG/DOSE, (OZEMPIC , 1 MG/DOSE,) 4 MG/3ML SOPN, Inject 1 mg into the skin once a week., Disp: 3 mL, Rfl: 3  Physical exam:  Vitals:   03/02/23 1034  BP: 131/71   Pulse: 75  Resp: 18  Temp: (!) 97.1 F (36.2 C)  TempSrc: Tympanic  SpO2: 99%  Weight: 159 lb 14.4 oz (72.5 kg)   Physical Exam Cardiovascular:     Rate and Rhythm: Normal rate and regular rhythm.     Heart sounds: Normal heart sounds.  Pulmonary:     Effort: Pulmonary effort is normal.  Skin:    General: Skin is warm and dry.  Neurological:     Mental Status: She is alert and oriented to person, place, and time.         Latest Ref Rng & Units 01/03/2023    9:09 AM  CMP  Glucose 70 - 99 mg/dL 99   BUN 8 - 23 mg/dL 11   Creatinine 9.55 - 1.00 mg/dL 9.55   Sodium 864 - 854 mmol/L 137   Potassium 3.5 - 5.1 mmol/L 3.6   Chloride 98 - 111 mmol/L 103   CO2 22 - 32 mmol/L 24   Calcium  8.9 - 10.3 mg/dL 9.8   Total Protein 6.5 - 8.1 g/dL 7.2   Total Bilirubin <8.7 mg/dL 0.8   Alkaline Phos 38 - 126 U/L 45   AST 15 - 41 U/L 17   ALT 0 - 44 U/L 16       Latest Ref Rng & Units 03/02/2023    9:54 AM  CBC  WBC 4.0 - 10.5 K/uL 6.5   Hemoglobin 12.0 - 15.0 g/dL 86.0   Hematocrit 63.9 - 46.0 % 42.2   Platelets 150 - 400 K/uL 377     No images are attached to the encounter.  No results found.   Assessment and plan- Patient is a 68 y.o. female with history of intermittent methemoglobinemia triggered by antibiotics and resultant hemolytic anemia here for routine follow-up  After her episode of methemoglobinemia in October 2024 patient's hemoglobin had dropped down to 7.9.  It is presently normal at 13.9.  Patient will need to take folate during her episodes of recovery from methemoglobinemia.  Patient is aware that if she were to develop any signs and symptoms of cyanosis and or shortness of breath in the setting of antibiotic use she will need to go to the ER for consideration of methylene blue  administration for methemoglobinemia.  I will see her back in 6 months with labs   Visit Diagnosis 1. 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HCC)   2. Methemoglobinemia      Dr. Annah Skene,  MD, MPH [facility] [phone] 03/02/2023 1:09 PM

## 2023-03-04 LAB — HAPTOGLOBIN: Haptoglobin: 118 mg/dL (ref 37–355)

## 2023-03-10 ENCOUNTER — Ambulatory Visit: Payer: PPO | Admitting: Dermatology

## 2023-03-10 ENCOUNTER — Other Ambulatory Visit: Payer: Self-pay

## 2023-03-10 MED ORDER — HYDROCODONE-ACETAMINOPHEN 10-325 MG PO TABS
1.0000 | ORAL_TABLET | Freq: Four times a day (QID) | ORAL | 0 refills | Status: AC | PRN
  Filled 2023-03-11: qty 150, 38d supply, fill #0

## 2023-03-11 ENCOUNTER — Other Ambulatory Visit: Payer: Self-pay

## 2023-03-21 ENCOUNTER — Other Ambulatory Visit: Payer: Self-pay

## 2023-03-21 DIAGNOSIS — L219 Seborrheic dermatitis, unspecified: Secondary | ICD-10-CM

## 2023-03-21 MED ORDER — KETOCONAZOLE 2 % EX SHAM
1.0000 | MEDICATED_SHAMPOO | CUTANEOUS | 6 refills | Status: AC
  Filled 2023-03-21: qty 120, 30d supply, fill #0
  Filled 2023-04-06: qty 120, 56d supply, fill #0
  Filled 2023-05-10 – 2023-05-18 (×3): qty 120, 56d supply, fill #1
  Filled 2023-06-24 – 2023-07-18 (×3): qty 120, 56d supply, fill #2
  Filled 2023-08-30: qty 120, 56d supply, fill #3
  Filled 2023-09-14 – 2023-11-07 (×3): qty 120, 56d supply, fill #4
  Filled 2024-01-04: qty 120, 56d supply, fill #5

## 2023-04-01 ENCOUNTER — Other Ambulatory Visit: Payer: Self-pay

## 2023-04-05 ENCOUNTER — Other Ambulatory Visit: Payer: Self-pay

## 2023-04-06 ENCOUNTER — Other Ambulatory Visit: Payer: Self-pay

## 2023-04-06 MED ORDER — SEMAGLUTIDE (1 MG/DOSE) 4 MG/3ML ~~LOC~~ SOPN
1.0000 mg | PEN_INJECTOR | SUBCUTANEOUS | 3 refills | Status: AC
  Filled 2023-04-06: qty 3, 28d supply, fill #0
  Filled 2023-05-03: qty 3, 28d supply, fill #1
  Filled 2023-06-02 – 2023-09-14 (×5): qty 3, 28d supply, fill #2
  Filled 2023-10-10 – 2024-01-04 (×3): qty 3, 28d supply, fill #3

## 2023-04-07 ENCOUNTER — Other Ambulatory Visit: Payer: Self-pay

## 2023-04-07 MED ORDER — HYDROCODONE-ACETAMINOPHEN 10-325 MG PO TABS
1.0000 | ORAL_TABLET | ORAL | 0 refills | Status: AC | PRN
  Filled 2023-04-08: qty 150, 25d supply, fill #0

## 2023-04-08 ENCOUNTER — Other Ambulatory Visit: Payer: Self-pay

## 2023-04-18 ENCOUNTER — Ambulatory Visit: Payer: PPO | Admitting: Urology

## 2023-04-24 IMAGING — CT CT ANGIO CHEST
2 of 6 series · 18 of 46 positions shown · IV contrast (APPLIED)
Comparison: None.

CLINICAL DATA: Rule out pulmonary embolus. Patient presents with
hypertension and positive D-dimer.

EXAM:
CT ANGIOGRAPHY CHEST WITH CONTRAST
TECHNIQUE: Multidetector CT imaging of the chest was performed using the
standard protocol during bolus administration of intravenous
contrast. Multiplanar CT image reconstructions and MIPs were
obtained to evaluate the vascular anatomy.
CONTRAST:  100mL OMNIPAQUE IOHEXOL 350 MG/ML SOLN

[Series 5: thins · axial · 0.68mm/px · z∈[-900,-672]mm · 16 of 250 slices shown]
[im 11/250  lung]
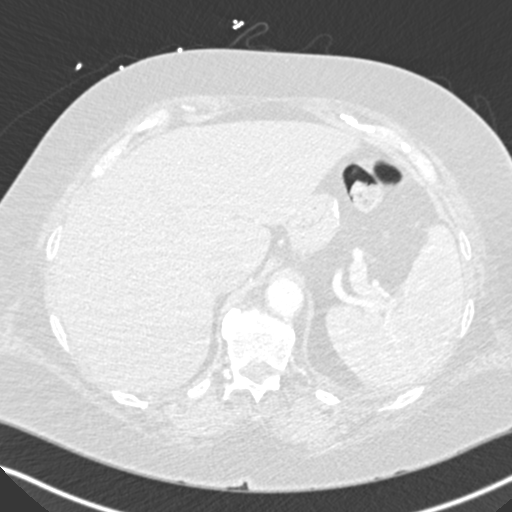
[im 33/250  soft-tissue]
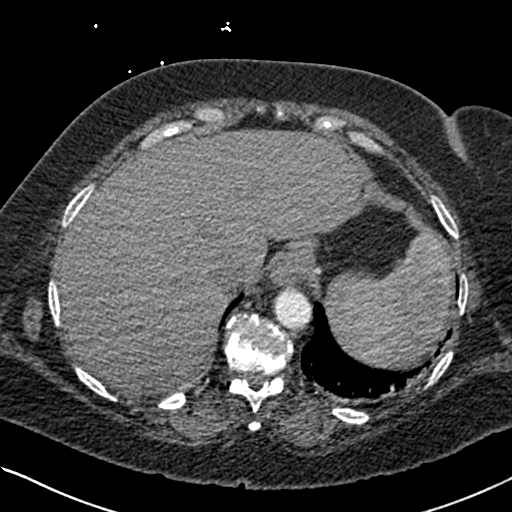
[im 44/250  lung]
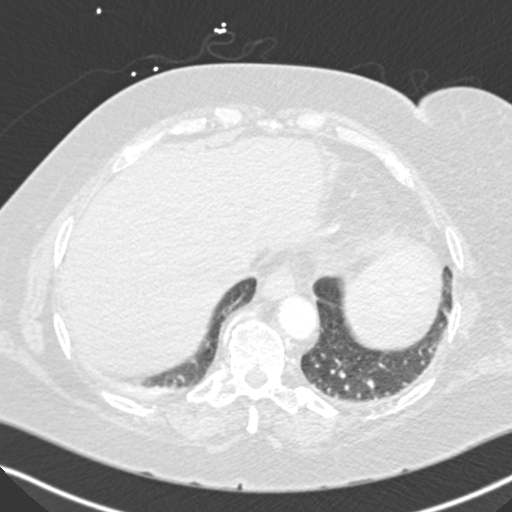
[im 55/250  soft-tissue]
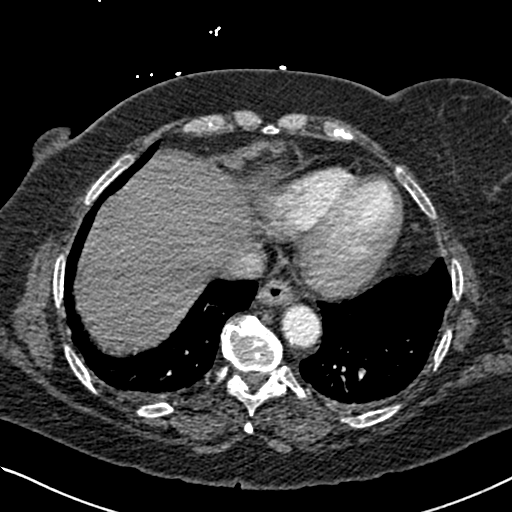
[im 76/250  lung]
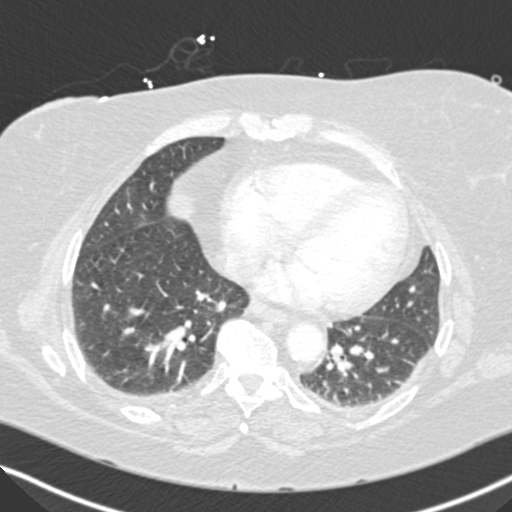
[im 87/250  soft-tissue]
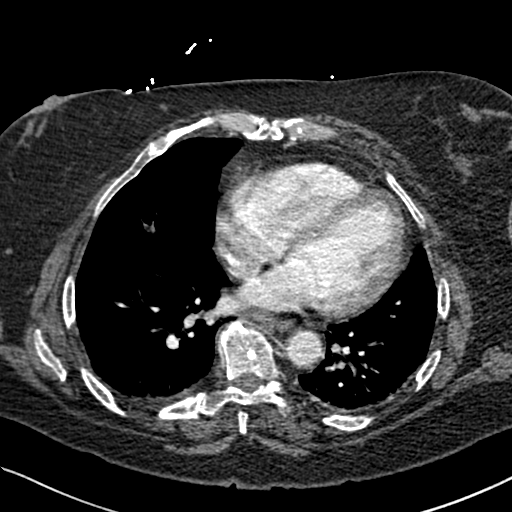
[im 98/250  lung]
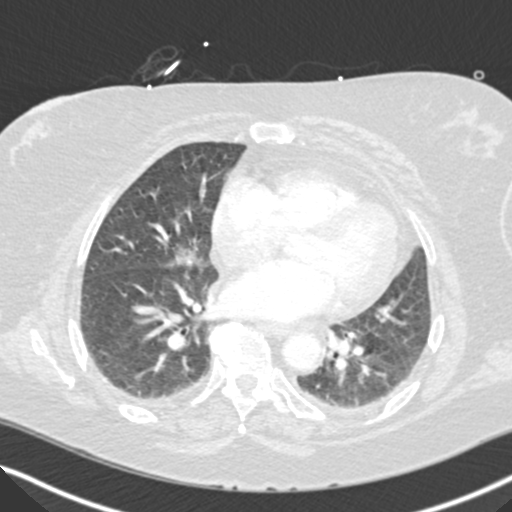
[im 120/250  soft-tissue]
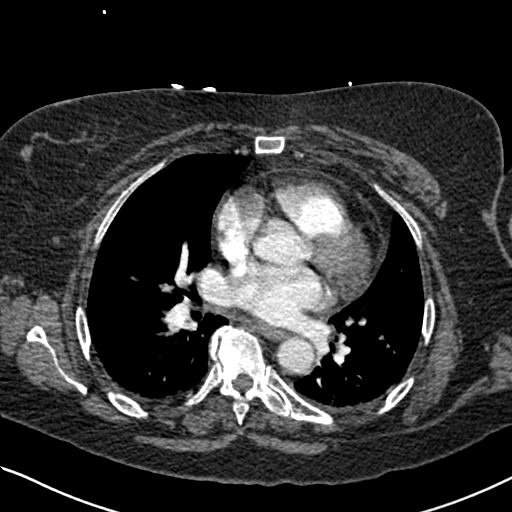
[im 130/250  lung]
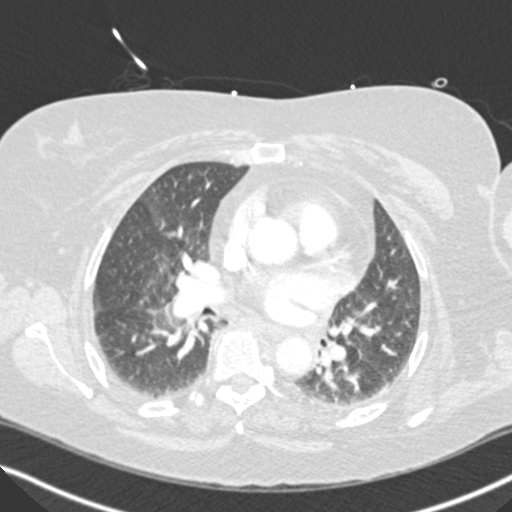
[im 152/250  soft-tissue]
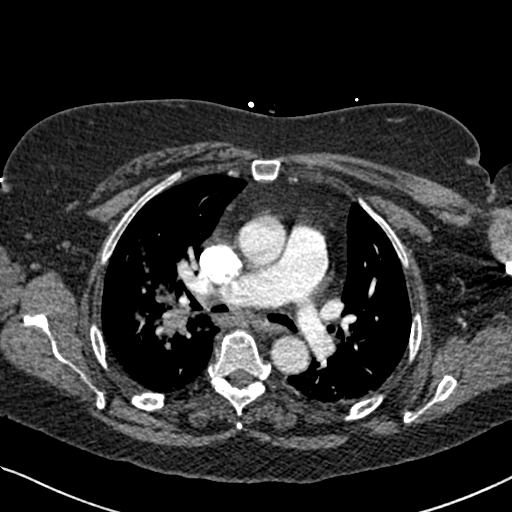
[im 163/250  lung]
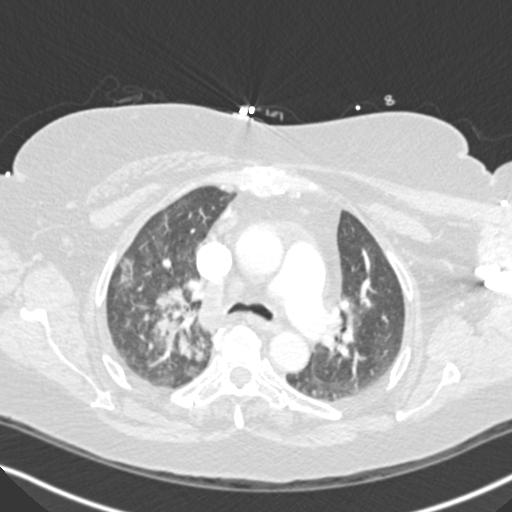
[im 174/250  soft-tissue]
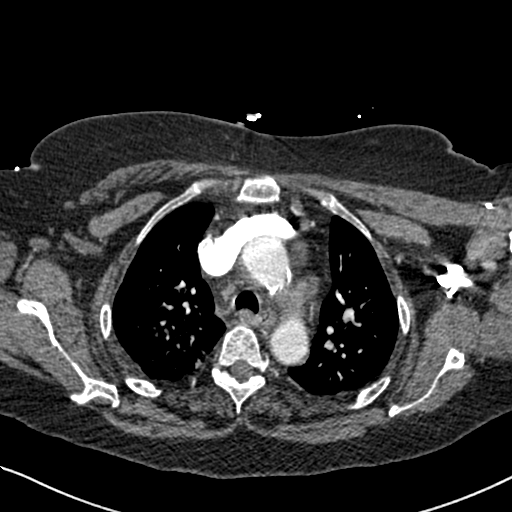
[im 195/250  lung]
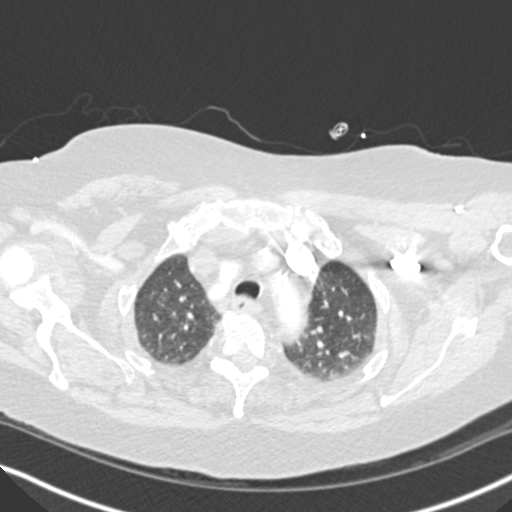
[im 206/250  soft-tissue]
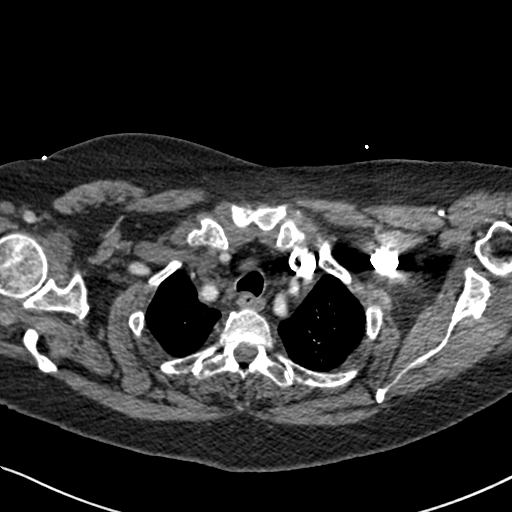
[im 217/250  lung]
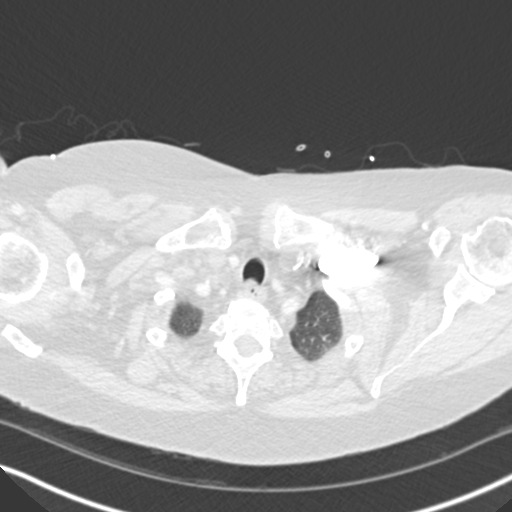
[im 239/250  soft-tissue]
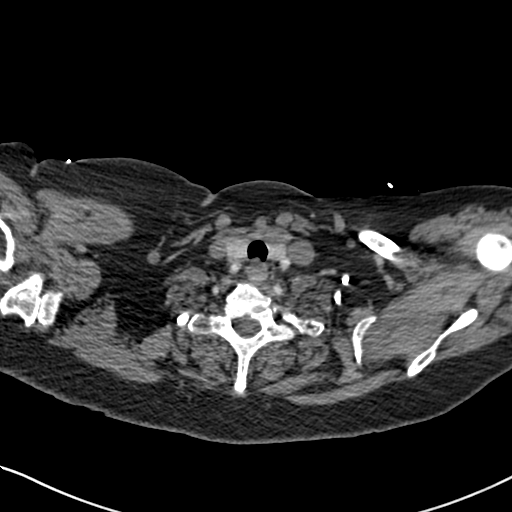

[Series 7: coronal mpr · coronal · 0.49mm/px · 2 of 87 slices shown]
[im 29/87  soft-tissue]
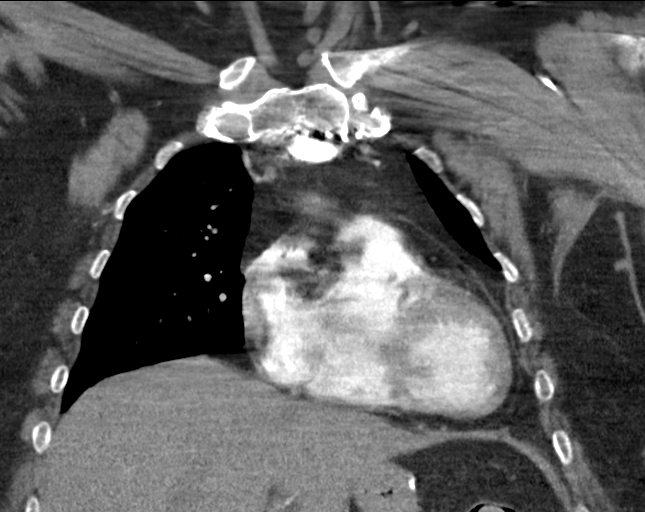
[im 58/87  soft-tissue]
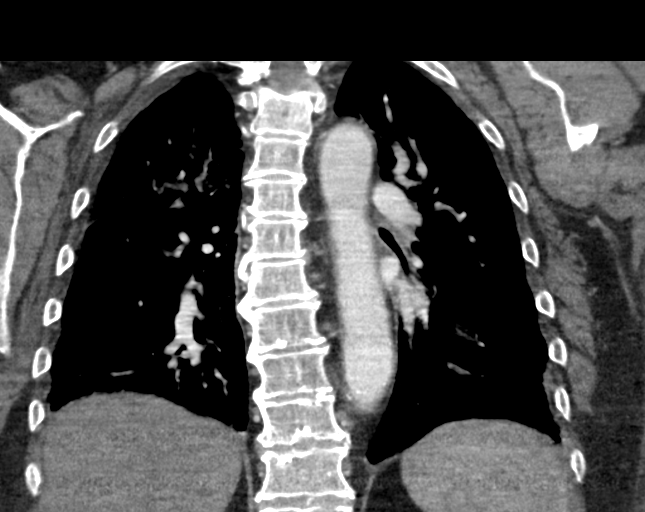

[18 of 46 positions shown; findings below may reference images not displayed]

FINDINGS: Cardiovascular: Suboptimal pulmonary arterial opacification is
noted. Within this limitation no signs of central obstructing
pulmonary embolus. No suspicious filling defects identified within
the lobar pulmonary arteries. Beyond the level of the lobar
pulmonary arteries there is too much motion artifact and suboptimal
pulmonary opacification to adequately assess for PE.

Heart size appears normal. No pericardial effusion. Aortic
atherosclerosis.

Mediastinum/Nodes: No enlarged mediastinal, hilar, or axillary lymph
nodes. Thyroid gland, trachea, and esophagus demonstrate no
significant findings.

Lungs/Pleura: Trace right pleural effusion. Extensive right upper
lobe airspace densities and ground-glass attenuation which in the
acute setting is concerning for pneumonia. Within the right middle
lobe there is a nodular density measuring 0.9 cm, image 56/6.

Upper Abdomen: No acute abnormalities. Postoperative changes are
noted involving the stomach.

Musculoskeletal: There is degenerative disc disease within the
thoracic spine. No acute or suspicious findings identified.

Review of the MIP images confirms the above findings.
IMPRESSION: 1. Suboptimal pulmonary arterial opacification is noted. Within this
limitation no signs of central obstructing pulmonary embolus. Beyond
the level of the lobar pulmonary arteries there is too much motion
artifact and suboptimal pulmonary opacification to adequately assess
for PE.
2. Extensive right upper lobe airspace densities and ground-glass
attenuation is concerning for pneumonia. Follow up CT of the chest
in 3 months, after appropriate antibiotic therapy, is recommended to
ensure complete resolution and to rule out underlying malignancy.
3. 9 mm nodule in the right middle lobe is indeterminate. Attention
on post therapy follow-up imaging CT
4. Aortic atherosclerosis.

Aortic Atherosclerosis ([id number]-0SW.W).

## 2023-04-25 ENCOUNTER — Other Ambulatory Visit: Payer: Self-pay | Admitting: Oncology

## 2023-04-25 ENCOUNTER — Other Ambulatory Visit: Payer: Self-pay

## 2023-04-25 ENCOUNTER — Encounter: Payer: Self-pay | Admitting: Oncology

## 2023-04-25 MED ORDER — FOLIC ACID 1 MG PO TABS
1.0000 mg | ORAL_TABLET | Freq: Every day | ORAL | 3 refills | Status: AC
  Filled 2023-04-25 – 2023-04-29 (×2): qty 30, 30d supply, fill #0
  Filled 2023-06-02: qty 30, 30d supply, fill #1
  Filled 2023-06-24 – 2023-07-17 (×3): qty 30, 30d supply, fill #2
  Filled 2023-08-30: qty 30, 30d supply, fill #3

## 2023-04-27 IMAGING — CR DG CHEST 2V
2 series · 2 of 2 positions shown · non-contrast
Comparison: September 17, 2020.

CLINICAL DATA: Shortness of breath.

EXAM:
CHEST - 2 VIEW

[chest lat]
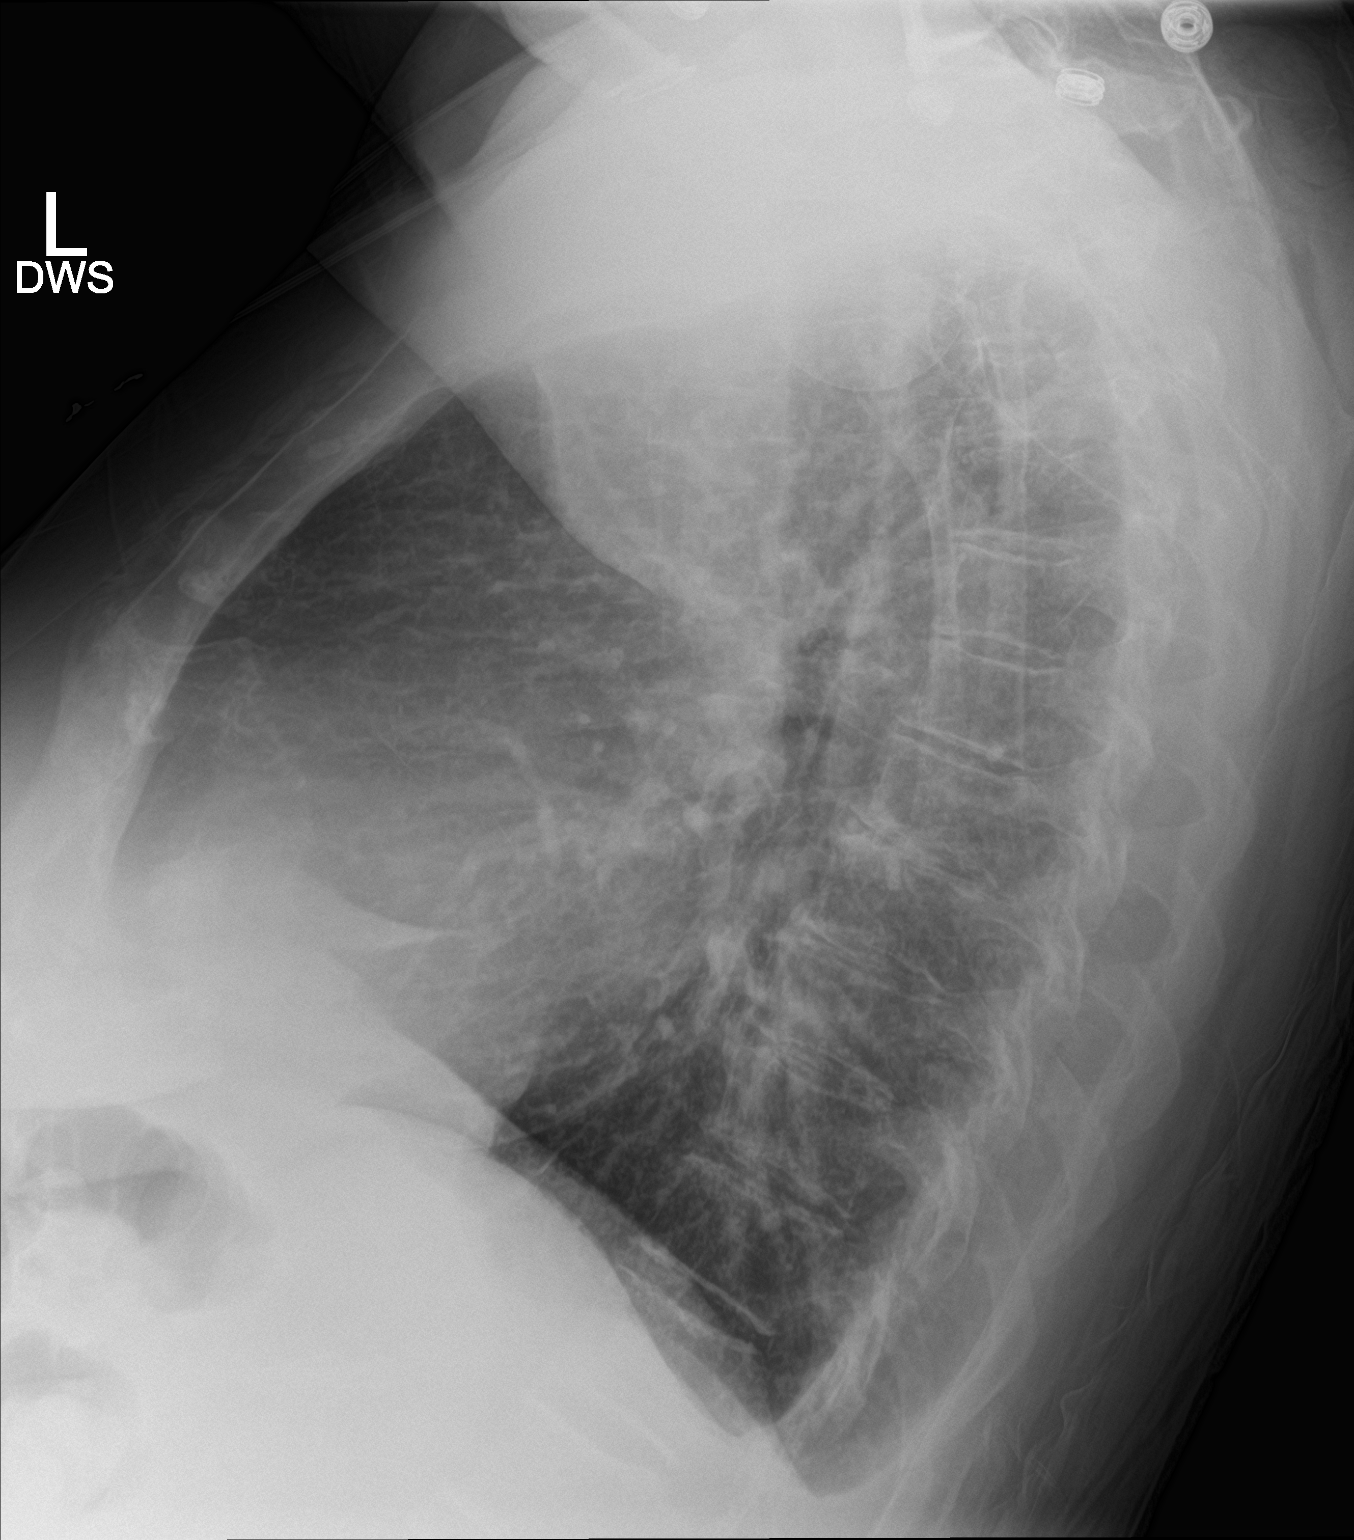

[chest ap]
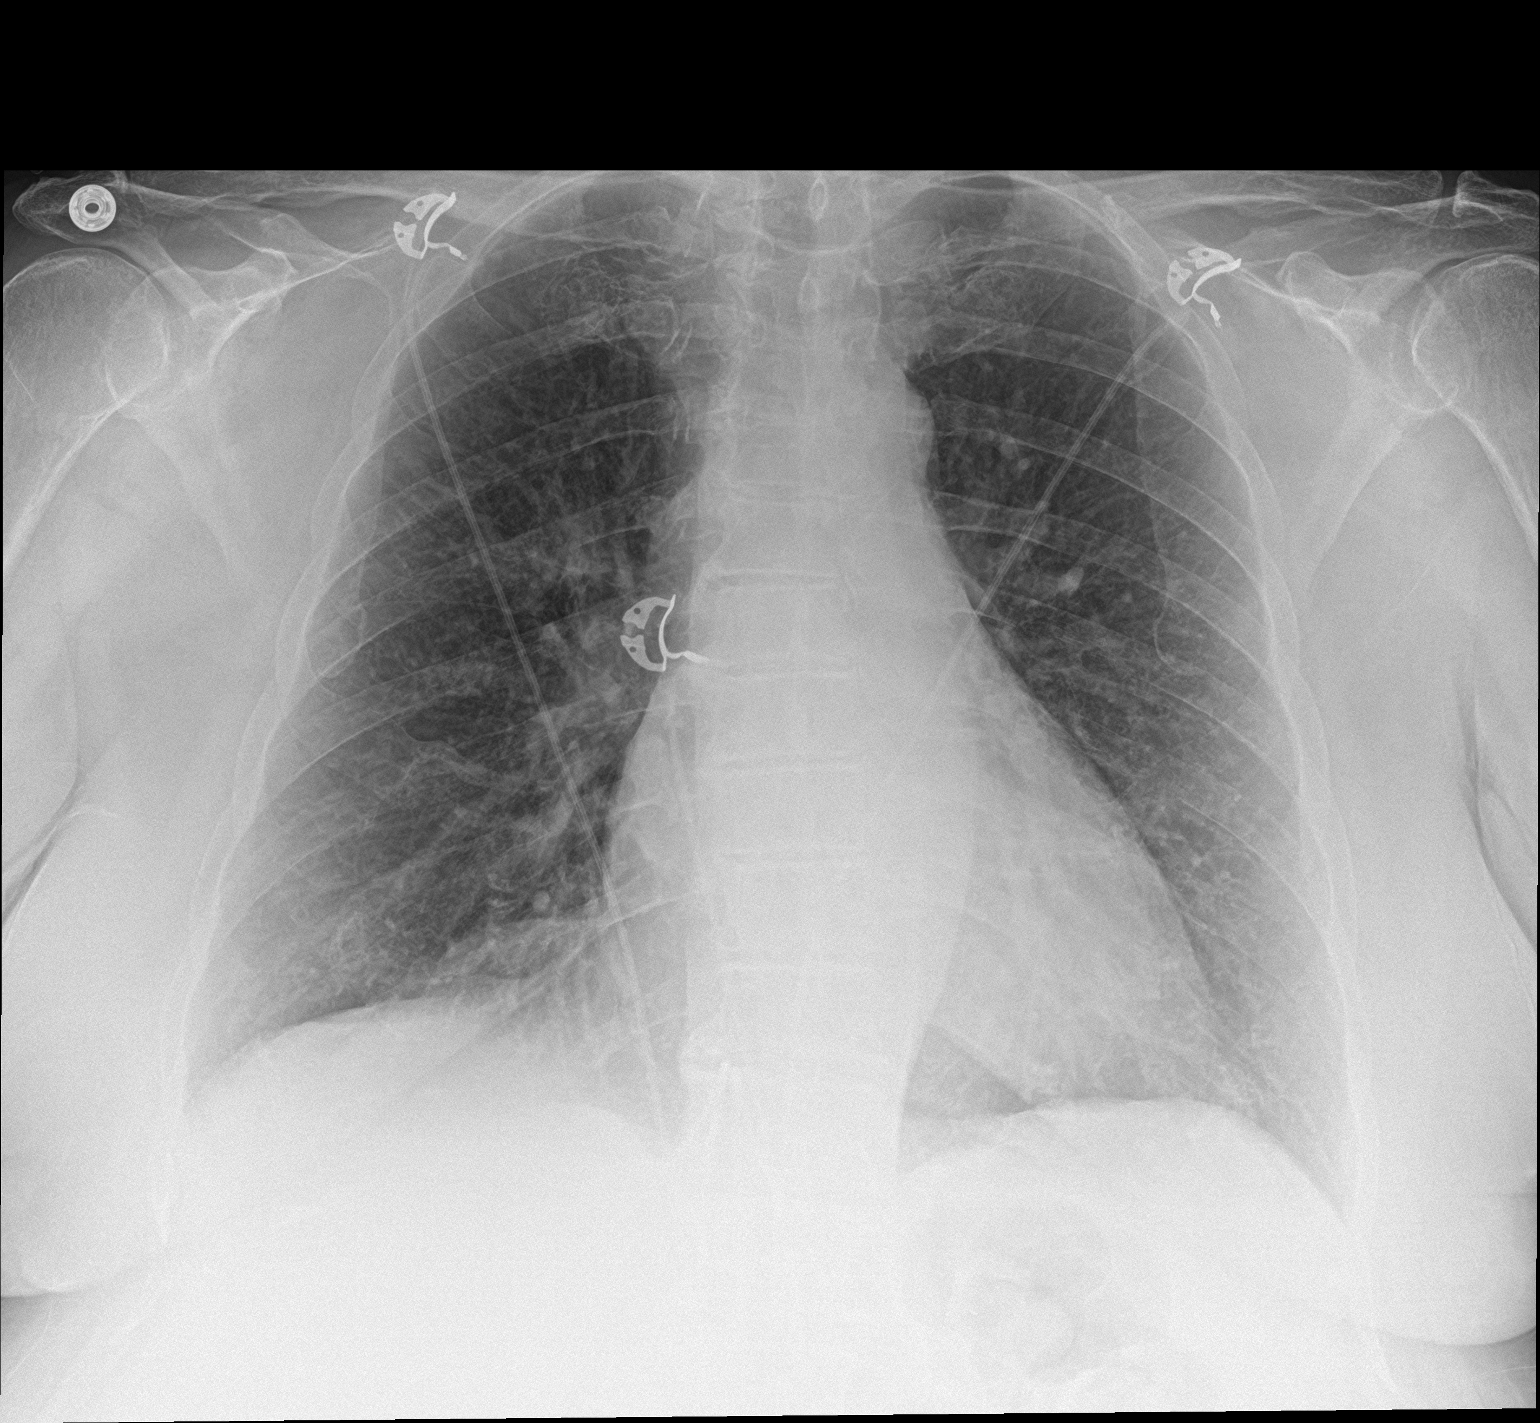

[2 of 2 positions shown; findings below may reference images not displayed]

FINDINGS: The heart size and mediastinal contours are within normal limits.
Both lungs are clear. The visualized skeletal structures are
unremarkable.
IMPRESSION: No active cardiopulmonary disease.

## 2023-04-29 ENCOUNTER — Other Ambulatory Visit: Payer: Self-pay

## 2023-05-02 ENCOUNTER — Other Ambulatory Visit: Payer: Self-pay

## 2023-05-03 ENCOUNTER — Other Ambulatory Visit: Payer: Self-pay

## 2023-05-03 MED ORDER — GABAPENTIN 300 MG PO CAPS
300.0000 mg | ORAL_CAPSULE | Freq: Three times a day (TID) | ORAL | 0 refills | Status: DC
  Filled 2023-05-03: qty 810, 90d supply, fill #0

## 2023-05-03 MED ORDER — PROMETHAZINE HCL 12.5 MG PO TABS
12.5000 mg | ORAL_TABLET | Freq: Three times a day (TID) | ORAL | 1 refills | Status: DC | PRN
  Filled 2023-05-03: qty 30, 10d supply, fill #0
  Filled 2023-06-24: qty 30, 10d supply, fill #1

## 2023-05-03 MED ORDER — HYDROCODONE-ACETAMINOPHEN 10-325 MG PO TABS
1.0000 | ORAL_TABLET | ORAL | 0 refills | Status: AC | PRN
  Filled 2023-05-06: qty 150, 25d supply, fill #0

## 2023-05-03 MED ORDER — ESTRADIOL 0.1 MG/GM VA CREA
TOPICAL_CREAM | VAGINAL | 0 refills | Status: DC
Start: 2023-05-03 — End: 2023-07-20
  Filled 2023-05-03: qty 42.5, 70d supply, fill #0

## 2023-05-06 ENCOUNTER — Other Ambulatory Visit: Payer: Self-pay

## 2023-05-10 ENCOUNTER — Other Ambulatory Visit: Payer: Self-pay

## 2023-05-16 ENCOUNTER — Other Ambulatory Visit: Payer: Self-pay

## 2023-05-20 ENCOUNTER — Other Ambulatory Visit: Payer: Self-pay

## 2023-05-20 MED ORDER — HYDROCODONE-ACETAMINOPHEN 10-325 MG PO TABS
1.0000 | ORAL_TABLET | Freq: Four times a day (QID) | ORAL | 0 refills | Status: AC | PRN
  Filled 2023-06-03 (×2): qty 150, 38d supply, fill #0

## 2023-05-20 MED ORDER — HYDROCODONE-ACETAMINOPHEN 10-325 MG PO TABS
1.0000 | ORAL_TABLET | ORAL | 0 refills | Status: AC | PRN
  Filled 2023-07-04 (×2): qty 150, 25d supply, fill #0

## 2023-05-20 MED ORDER — HYDROCODONE-ACETAMINOPHEN 10-325 MG PO TABS
1.0000 | ORAL_TABLET | Freq: Four times a day (QID) | ORAL | 0 refills | Status: AC | PRN
  Filled 2023-08-03: qty 150, 38d supply, fill #0

## 2023-05-23 ENCOUNTER — Other Ambulatory Visit: Payer: Self-pay

## 2023-05-24 ENCOUNTER — Other Ambulatory Visit: Payer: Self-pay

## 2023-05-29 ENCOUNTER — Other Ambulatory Visit: Payer: Self-pay

## 2023-05-29 MED ORDER — ROSUVASTATIN CALCIUM 5 MG PO TABS
5.0000 mg | ORAL_TABLET | Freq: Every day | ORAL | 3 refills | Status: AC
  Filled 2023-05-29 – 2023-07-17 (×3): qty 100, 100d supply, fill #0
  Filled 2023-10-10: qty 100, 100d supply, fill #1
  Filled 2024-02-15: qty 100, 100d supply, fill #2

## 2023-05-29 MED ORDER — MONTELUKAST SODIUM 10 MG PO TABS
10.0000 mg | ORAL_TABLET | Freq: Every day | ORAL | 3 refills | Status: AC
  Filled 2023-05-29 – 2023-06-24 (×2): qty 100, 100d supply, fill #0
  Filled 2023-10-10: qty 100, 100d supply, fill #1
  Filled 2024-01-04: qty 100, 100d supply, fill #2

## 2023-05-29 MED ORDER — DULOXETINE HCL 60 MG PO CPEP
60.0000 mg | ORAL_CAPSULE | Freq: Two times a day (BID) | ORAL | 3 refills | Status: AC
  Filled 2023-05-29 – 2023-06-23 (×2): qty 200, 100d supply, fill #0
  Filled 2023-11-01: qty 200, 100d supply, fill #1
  Filled 2024-01-04 – 2024-01-19 (×2): qty 200, 100d supply, fill #2

## 2023-05-29 MED ORDER — LEVOCETIRIZINE DIHYDROCHLORIDE 5 MG PO TABS
5.0000 mg | ORAL_TABLET | Freq: Every evening | ORAL | 3 refills | Status: AC
  Filled 2023-05-29 – 2023-07-17 (×3): qty 100, 100d supply, fill #0
  Filled 2023-10-10: qty 100, 100d supply, fill #1
  Filled 2024-01-04: qty 100, 100d supply, fill #2

## 2023-06-01 ENCOUNTER — Other Ambulatory Visit: Payer: Self-pay

## 2023-06-01 ENCOUNTER — Telehealth: Payer: Self-pay

## 2023-06-01 MED ORDER — MUPIROCIN 2 % EX OINT
1.0000 | TOPICAL_OINTMENT | Freq: Two times a day (BID) | CUTANEOUS | 0 refills | Status: AC
  Filled 2023-06-01: qty 22, 11d supply, fill #0

## 2023-06-01 NOTE — Telephone Encounter (Signed)
 Mupirocin  RF

## 2023-06-02 ENCOUNTER — Other Ambulatory Visit: Payer: Self-pay

## 2023-06-03 ENCOUNTER — Other Ambulatory Visit: Payer: Self-pay

## 2023-06-08 ENCOUNTER — Other Ambulatory Visit: Payer: Self-pay

## 2023-06-09 ENCOUNTER — Other Ambulatory Visit: Payer: Self-pay

## 2023-06-12 ENCOUNTER — Other Ambulatory Visit: Payer: Self-pay

## 2023-06-12 MED ORDER — OZEMPIC (1 MG/DOSE) 4 MG/3ML ~~LOC~~ SOPN
1.0000 mg | PEN_INJECTOR | SUBCUTANEOUS | 11 refills | Status: AC
  Filled 2023-06-12 – 2023-06-23 (×8): qty 3, 28d supply, fill #0
  Filled 2023-07-17: qty 3, 28d supply, fill #1
  Filled 2023-08-17: qty 3, 28d supply, fill #2
  Filled 2023-10-10: qty 3, 28d supply, fill #3
  Filled 2023-11-07: qty 3, 28d supply, fill #4
  Filled 2023-12-02: qty 3, 28d supply, fill #5
  Filled 2024-01-04 – 2024-01-26 (×5): qty 3, 28d supply, fill #6
  Filled 2024-02-23: qty 3, 28d supply, fill #7

## 2023-06-13 ENCOUNTER — Other Ambulatory Visit: Payer: Self-pay

## 2023-06-14 ENCOUNTER — Other Ambulatory Visit: Payer: Self-pay

## 2023-06-15 ENCOUNTER — Other Ambulatory Visit: Payer: Self-pay

## 2023-06-23 ENCOUNTER — Other Ambulatory Visit: Payer: Self-pay

## 2023-06-24 ENCOUNTER — Other Ambulatory Visit: Payer: Self-pay

## 2023-07-04 ENCOUNTER — Other Ambulatory Visit: Payer: Self-pay

## 2023-07-14 ENCOUNTER — Other Ambulatory Visit: Payer: Self-pay

## 2023-07-17 ENCOUNTER — Other Ambulatory Visit: Payer: Self-pay

## 2023-07-18 ENCOUNTER — Other Ambulatory Visit: Payer: Self-pay

## 2023-07-19 ENCOUNTER — Other Ambulatory Visit: Payer: Self-pay

## 2023-07-20 MED ORDER — ESTRADIOL 0.1 MG/GM VA CREA
TOPICAL_CREAM | VAGINAL | 4 refills | Status: AC
  Filled 2023-07-20: qty 42.5, 70d supply, fill #0
  Filled 2023-10-10: qty 42.5, 70d supply, fill #1

## 2023-07-21 ENCOUNTER — Other Ambulatory Visit: Payer: Self-pay

## 2023-07-25 ENCOUNTER — Encounter: Payer: Self-pay | Admitting: Oncology

## 2023-07-26 ENCOUNTER — Other Ambulatory Visit: Payer: Self-pay

## 2023-07-26 ENCOUNTER — Inpatient Hospital Stay: Attending: Oncology

## 2023-07-26 ENCOUNTER — Inpatient Hospital Stay (HOSPITAL_BASED_OUTPATIENT_CLINIC_OR_DEPARTMENT_OTHER): Admitting: Nurse Practitioner

## 2023-07-26 ENCOUNTER — Encounter: Payer: Self-pay | Admitting: Nurse Practitioner

## 2023-07-26 VITALS — BP 128/64 | HR 72 | Temp 98.6°F | Resp 20 | Wt 143.6 lb

## 2023-07-26 DIAGNOSIS — R5383 Other fatigue: Secondary | ICD-10-CM

## 2023-07-26 DIAGNOSIS — E611 Iron deficiency: Secondary | ICD-10-CM | POA: Diagnosis not present

## 2023-07-26 DIAGNOSIS — D749 Methemoglobinemia, unspecified: Secondary | ICD-10-CM

## 2023-07-26 DIAGNOSIS — D599 Acquired hemolytic anemia, unspecified: Secondary | ICD-10-CM | POA: Diagnosis present

## 2023-07-26 DIAGNOSIS — J069 Acute upper respiratory infection, unspecified: Secondary | ICD-10-CM | POA: Diagnosis not present

## 2023-07-26 DIAGNOSIS — Z87440 Personal history of urinary (tract) infections: Secondary | ICD-10-CM | POA: Diagnosis not present

## 2023-07-26 LAB — CMP (CANCER CENTER ONLY)
ALT: 14 U/L (ref 0–44)
AST: 17 U/L (ref 15–41)
Albumin: 4.4 g/dL (ref 3.5–5.0)
Alkaline Phosphatase: 54 U/L (ref 38–126)
Anion gap: 8 (ref 5–15)
BUN: 14 mg/dL (ref 8–23)
CO2: 24 mmol/L (ref 22–32)
Calcium: 9.5 mg/dL (ref 8.9–10.3)
Chloride: 103 mmol/L (ref 98–111)
Creatinine: 0.45 mg/dL (ref 0.44–1.00)
GFR, Estimated: >60 mL/min (ref >60)
Glucose, Bld: 88 mg/dL (ref 70–99)
Potassium: 3.7 mmol/L (ref 3.5–5.1)
Sodium: 135 mmol/L (ref 135–145)
Total Bilirubin: 0.8 mg/dL (ref 0.0–1.2)
Total Protein: 7.3 g/dL (ref 6.5–8.1)

## 2023-07-26 LAB — CBC WITH DIFFERENTIAL (CANCER CENTER ONLY)
Abs Immature Granulocytes: 0.05 10*3/uL (ref 0.00–0.07)
Basophils Absolute: 0 10*3/uL (ref 0.0–0.1)
Basophils Relative: 1 %
Eosinophils Absolute: 0.1 10*3/uL (ref 0.0–0.5)
Eosinophils Relative: 2 %
HCT: 41.2 % (ref 36.0–46.0)
Hemoglobin: 13.7 g/dL (ref 12.0–15.0)
Immature Granulocytes: 1 %
Lymphocytes Relative: 18 %
Lymphs Abs: 1 10*3/uL (ref 0.7–4.0)
MCH: 31.3 pg (ref 26.0–34.0)
MCHC: 33.3 g/dL (ref 30.0–36.0)
MCV: 94.1 fL (ref 80.0–100.0)
Monocytes Absolute: 0.3 10*3/uL (ref 0.1–1.0)
Monocytes Relative: 6 %
Neutro Abs: 4 10*3/uL (ref 1.7–7.7)
Neutrophils Relative %: 72 %
Platelet Count: 365 10*3/uL (ref 150–400)
RBC: 4.38 MIL/uL (ref 3.87–5.11)
RDW: 12.1 % (ref 11.5–15.5)
WBC Count: 5.5 10*3/uL (ref 4.0–10.5)
nRBC: 0 % (ref 0.0–0.2)

## 2023-07-26 LAB — IRON AND TIBC
Iron: 74 ug/dL (ref 28–170)
Saturation Ratios: 21 % (ref 10.4–31.8)
TIBC: 349 ug/dL (ref 250–450)
UIBC: 275 ug/dL

## 2023-07-26 LAB — TSH: TSH: 0.361 u[IU]/mL (ref 0.350–4.500)

## 2023-07-26 LAB — FERRITIN: Ferritin: 50 ng/mL (ref 11–307)

## 2023-07-26 LAB — RETICULOCYTES
Immature Retic Fract: 7.4 % (ref 2.3–15.9)
RBC.: 4.34 MIL/uL (ref 3.87–5.11)
Retic Count, Absolute: 99.4 10*3/uL (ref 19.0–186.0)
Retic Ct Pct: 2.3 % (ref 0.4–3.1)

## 2023-07-26 LAB — FOLATE: Folate: >40 ng/mL (ref >5.9)

## 2023-07-26 LAB — VITAMIN B12: Vitamin B-12: 829 pg/mL (ref 180–914)

## 2023-07-26 MED ORDER — FLUTICASONE PROPIONATE 50 MCG/ACT NA SUSP
2.0000 | Freq: Every day | NASAL | 11 refills | Status: AC
  Filled 2023-07-26: qty 16, 30d supply, fill #0
  Filled 2023-08-18: qty 16, 30d supply, fill #1
  Filled 2023-10-10: qty 16, 30d supply, fill #2
  Filled 2023-11-07: qty 16, 30d supply, fill #3
  Filled 2023-12-16: qty 16, 30d supply, fill #4
  Filled 2024-01-19 – 2024-02-23 (×5): qty 16, 30d supply, fill #5

## 2023-07-26 MED ORDER — ALBUTEROL SULFATE HFA 108 (90 BASE) MCG/ACT IN AERS
2.0000 | INHALATION_SPRAY | RESPIRATORY_TRACT | 3 refills | Status: DC | PRN
  Filled 2023-07-26: qty 18, 16d supply, fill #0
  Filled 2023-10-10: qty 18, 16d supply, fill #1

## 2023-07-26 MED ORDER — PREDNISONE 20 MG PO TABS
40.0000 mg | ORAL_TABLET | Freq: Every day | ORAL | 0 refills | Status: AC
  Filled 2023-07-26: qty 10, 5d supply, fill #0

## 2023-07-26 MED ORDER — AZITHROMYCIN 250 MG PO TABS
ORAL_TABLET | ORAL | 0 refills | Status: AC
  Filled 2023-07-26: qty 6, 5d supply, fill #0

## 2023-07-26 NOTE — Progress Notes (Signed)
 Patient states she has had little no energy. Patient states [facility] told her that she may need a oxygen  tank at home and she is wondering whom would place that order for her.

## 2023-07-26 NOTE — Progress Notes (Signed)
 Symptom Management Clinic  [facility] A Department of the [place]. [facility] [address], [address] [place], [place] [address] [phone] (phone) [phone] (fax)  Patient Care Team: Claudene Rayfield HERO, MD as PCP - General (Family Medicine) Fleeta Milks, Lamar ORN, MD (Obstetrics and Gynecology) Melanee Annah BROCKS, MD as Consulting Physician (Oncology)   Name of the patient: Bethany Scott  [id number]  [birth year]   Date of visit: 07/26/23  Diagnosis- Methemoglobinemia  Chief complaint/ Reason for visit- Hoarseness/Shortness of breath with history of asthma  Heme/Onc history:  Oncology History   No history exists.    Interval history- Bethany Scott is a 68 y.o. 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 Symptoms present for a few days and persisting. She is worried about worsening symptoms that would require antibiotics which is high risk of exacerbation of her methemoglobinemia. Denies cough. Endorses sore throat and rhinorrhea. She uses her Breo inhaler regulaly as prescribed. No fevers, chills. Denies weakness.   Review of systems- Review of Systems  Constitutional:  Negative for chills, fever and malaise/fatigue.  HENT:  Positive for sore throat. Negative for congestion, ear pain and sinus pain.   Eyes:  Negative for redness.  Respiratory:  Positive for shortness of breath. Negative for cough and sputum production.   Cardiovascular:  Negative for chest pain.  Skin:        No discoloration    Allergies  Allergen Reactions   Cephalexin Anaphylaxis   Cephalosporins Anaphylaxis and Other (See Comments)   Macrobid  [Nitrofurantoin  Macrocrystal] Other (See Comments)    Causes methemoglobinemia   Penicillins Anaphylaxis and Other (See Comments)    Has patient had a PCN reaction causing immediate rash,  facial/tongue/throat swelling, SOB or lightheadedness with hypotension: Yes Has patient had a PCN reaction causing severe rash involving mucus membranes or skin necrosis: No Has patient had a PCN reaction that required hospitalization Yes Has patient had a PCN reaction occurring within the last 10 years: No If all of the above answers are NO, then may proceed with Cephalosporin use.    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   Asthma    Blood transfusion without reported diagnosis YRS AGO   COVID-19 03/2018   Depression    Gastric ulcer, unspecified as acute or chronic, without mention of hemorrhage, perforation, or obstruction YRS AGO   GERD (gastroesophageal reflux disease)    Hyperlipidemia    Hypertension    Methemoglobinemia    Migraine    hx of migraines   Obesity, unspecified    Other chronic cystitis    Personal history of colonic polyps    Pneumonia    PONV (postoperative nausea and vomiting)    NAUSEA, SCOPOLAMINE  PATCH HELPED WITH LAST SURGERIES   Pre-diabetes    Past Surgical History:  Procedure Laterality Date   ABDOMINAL HYSTERECTOMY  01/25/1994   uterine fibroids; DUB; cervical dysplasia; ovaries resected two years later.     APPENDECTOMY     BILATERAL OOPHORECTOMY  2001   BREAST BIOPSY Left 10/08/2021   10:00  ribbon, us  bx, path- FRAGMENTS OF BENIGN FIBROADENOMA. - BACKGROUND MAMMARY PARENCHYMA WITH FIBROCYSTIC CHANGES AND PSEUDOANGIOMATOUS STROMAL HYPERPLASIA. - CALCIFICATIONS ASSOCIATED WITH BENIGN MAMMARY ELEMENTS. - NEGATIVE FOR ATYPICAL PROLIFERATIVE BREAST DISEASE.   CESAREAN SECTION     x 2   COLON SURGERY  YRS AGO   SIGMOIDECTOMY   Colonoscopy  09/08/2012    diverticulosis, hemorrhoids.  [name].  Symptoms:  anemia, hemoccult +.   ESOPHAGOGASTRODUODENOSCOPY  09/08/2012   +HH.  [name].  Symptoms: anemia, hemoccult +.   HIP ARTHROSCOPY Right    LAPAROSCOPIC GASTRIC SLEEVE RESECTION N/A 03/01/2016   Procedure: LAPAROSCOPIC GASTRIC SLEEVE RESECTION, UPPER ENDO;  Surgeon: Camellia Blush, MD;  Location: [facility];  Service: General;  Laterality: N/A;   LAPAROSCOPIC LYSIS OF ADHESIONS N/A 02/10/2016   Procedure: LAPAROSCOPIC LYSIS OF ADHESIONS;  Surgeon: Camellia Blush, MD;  Location: [facility];  Service: General;  Laterality: N/A;   LAPAROSCOPY N/A 02/10/2016   Procedure: LAPAROSCOPY DIAGNOSTIC;  Surgeon: Camellia Blush, MD;  Location: [facility];  Service: General;  Laterality: N/A;   NASAL SEPTUM SURGERY     NECK SURGERY     Rheumatology Consult  11/26/2011   multiple arthralgias. Autoimmune labs negative.  Rx for Plaquenil for non-specific autoimmune process.  [facility].   SHOULDER ARTHROSCOPY WITH OPEN ROTATOR CUFF REPAIR AND DISTAL CLAVICLE ACROMINECTOMY Right 01/13/2021   Procedure: SHOULDER ARTHROSCOPY WITH OPEN ROTATOR CUFF REPAIR AND DISTAL CLAVICLE ACROMINECTOMY;  Surgeon: Marchia Drivers, MD;  Location: [facility];  Service: Orthopedics;  Laterality: Right;   SHOULDER ARTHROSCOPY WITH SUBACROMIAL DECOMPRESSION Right 01/13/2021   Procedure: SHOULDER ARTHROSCOPY WITH SUBACROMIAL DECOMPRESSION, BICEPS TENOTOMY;  Surgeon: Marchia Drivers, MD;  Location: [facility];  Service: Orthopedics;  Laterality: Right;   SIGMOID RESECTION / RECTOPEXY     SPINE SURGERY     TONSILLECTOMY AND ADENOIDECTOMY     TOTAL HIP ARTHROPLASTY Right 09/27/2017   Procedure: TOTAL HIP ARTHROPLASTY ANTERIOR APPROACH;  Surgeon: Kathlynn Sharper, MD;  Location: [facility];  Service: Orthopedics;  Laterality: Right;   TOTAL HIP ARTHROPLASTY Left 12/27/2017   Procedure: TOTAL HIP ARTHROPLASTY ANTERIOR APPROACH;  Surgeon: Kathlynn Sharper, MD;  Location: [facility];  Service: Orthopedics;   Laterality: Left;   Social History   Socioeconomic History   Marital status: Married    Spouse name: Not on file   Number of children: 2   Years of education: 12   Highest education level: Not on file  Occupational History   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    Employer: [employer]    Comment: Dermatology office  Tobacco Use   Smoking status: Former    Current packs/day: 1.00    Average packs/day: 1 pack/day for 5.0 years (5.0 ttl pk-yrs)    Types: Cigarettes    Passive exposure: Past   Smokeless tobacco: Never   Tobacco comments:    Quit 32 years ago  Vaping Use   Vaping status: Never Used  Substance and Sexual Activity   Alcohol use: Yes    Comment: rare   Drug use: No   Sexual activity: Yes    Birth control/protection: Post-menopausal, Surgical  Other Topics Concern   Not on file  Social History Narrative   Marital status: married since 32 years; happily married; no abuse.      Children: 2 sons (24, 82); 2 grandchildren  (5yo Atlast, 3 yo August)      Lives: with husband, [name]/son.      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      Tobacco abuse:  Former smoker.      Alcohol:  None; holidays only      Exercise: none in 2017     Seatbelt: 100%; no texting   Social Drivers of Corporate investment banker Strain: Low Risk  (07/21/2021)   Received from [facility]   Overall Financial Resource Strain (CARDIA)    Difficulty of Paying Living Expenses: Not hard at all  Food Insecurity: No Food Insecurity (07/21/2021)   Received from [facility]   Hunger Vital Sign    Within the past 12 months, you worried that your food would run out before you got the money to buy more.: Never true    Within the past 12 months, the food you bought just didn't last and you didn't have money to get more.: Never true  Transportation Needs: No Transportation Needs (07/21/2021)   Received from [facility]   PRAPARE -  Transportation    Lack of Transportation (Medical): No    Lack of Transportation (Non-Medical): No  Physical Activity: Sufficiently Active (07/21/2021)   Received from [facility]   Exercise Vital Sign    On average, how many days per week do you engage in moderate to strenuous exercise (like a brisk walk)?: 5 days    On average, how many minutes do you engage in exercise at this level?: 30 min  Stress: Stress Concern Present (07/21/2021)   Received from [facility] - Occupational Stress Questionnaire    Feeling of Stress : To some extent  Social Connections: Socially Integrated (07/21/2021)   Received from [facility]   Social Connection and Isolation Panel    In a typical week, how many times do you talk on the phone with family, friends, or neighbors?: More than three times a week    How often do you get together with friends or relatives?: More than three times a week    How often do you attend church or religious services?: 1 to 4 times per year    Do you belong to any clubs or organizations such as church groups, unions, fraternal or athletic groups, or school groups?: Yes    How often do you attend meetings of the clubs or organizations you belong to?: Never    Are you married, widowed, divorced, separated, never married, or living with a partner?: Married  Catering manager Violence: Not on file   Family History  Problem Relation Age of Onset   Bipolar disorder Father    Transient ischemic attack Father    Dementia Father        secondary to head trauma   Hypertension Father    Diabetes Father    CAD Father    Cancer Mother 25       lung   Migraines Mother    Hypothyroidism Mother    Hypertension Sister    Atrial fibrillation Sister    Hypothyroidism Sister    Alcohol abuse Brother    Hypertension Brother    Alcohol abuse Brother    Atrial fibrillation Maternal Grandmother    COPD Maternal Grandmother    Diabetes  Maternal Grandmother    Hyperlipidemia Maternal Grandmother    Diabetes Maternal Grandfather    Diabetes Paternal Grandfather    Heart disease Paternal Grandfather    Breast cancer Sister 6    Current Outpatient Medications:  azelastine  (ASTELIN ) 0.1 % nasal spray, Place 1-2 sprays into both nostrils 2 (two) times daily., Disp: 30 mL, Rfl: 12   azithromycin  (ZITHROMAX ) 250 MG tablet, Take 250 mg by mouth as directed., Disp: , Rfl:    budesonide -formoterol  (SYMBICORT ) 160-4.5 MCG/ACT inhaler, Inhale 2 puffs into the lungs 2 (two) times daily., Disp: 10.2 g, Rfl: 5   busPIRone  (BUSPAR ) 15 MG tablet, Take 1 tablet (15 mg total) by mouth 3 (three) times daily., Disp: 270 tablet, Rfl: 3   DULoxetine  (CYMBALTA ) 60 MG capsule, Take 1 capsule (60 mg total) by mouth 2 (two) times daily., Disp: 180 capsule, Rfl: 4   DULoxetine  (CYMBALTA ) 60 MG capsule, Take 1 capsule (60 mg total) by mouth 2 (two) times daily., Disp: 200 capsule, Rfl: 3   dutasteride  (AVODART ) 0.5 MG capsule, Take 1 capsule (0.5 mg total) by mouth daily., Disp: 30 capsule, Rfl: 6   estradiol  (ESTRACE ) 0.1 MG/GM vaginal cream, Place 2 g vaginally twice a week, Disp: 42.5 g, Rfl: 4   fluticasone  furoate-vilanterol (BREO ELLIPTA ) 200-25 MCG/ACT AEPB, Inhale 1 puff into the lungs daily., Disp: 60 each, Rfl: 5   folic acid  (FOLVITE ) 1 MG tablet, Take 1 tablet (1 mg total) by mouth daily., Disp: 30 tablet, Rfl: 3   gabapentin  (NEURONTIN ) 300 MG capsule, Take 1-3 capsules (300-900 mg total) by mouth 3 (three) times daily., Disp: 810 capsule, Rfl: 0   HYDROcodone -acetaminophen  (NORCO) 10-325 MG tablet, Take 1 tablet by mouth every 4 (four) hours as needed for pain., Disp: 150 tablet, Rfl: 0   HYDROcodone -acetaminophen  (NORCO) 10-325 MG tablet, Take 1 tablet by mouth every 4 (four) hours as needed for pain., Disp: 150 tablet, Rfl: 0   HYDROcodone -acetaminophen  (NORCO) 10-325 MG tablet, Take 1 tablet by mouth every 4 (four) hours as needed for  Pain., Disp: 150 tablet, Rfl: 0   HYDROcodone -acetaminophen  (NORCO) 10-325 MG tablet, Take 1 tablet by mouth every 6 (six) hours as needed for Pain for up to 30 days, Disp: 150 tablet, Rfl: 0   HYDROcodone -acetaminophen  (NORCO) 10-325 MG tablet, Take 1 tablet by mouth every 4 (four) hours as needed for Pain, Disp: 150 tablet, Rfl: 0   HYDROcodone -acetaminophen  (NORCO) 10-325 MG tablet, Take 1 tablet by mouth every 4 (four) hours as needed for pain., Disp: 150 tablet, Rfl: 0   HYDROcodone -acetaminophen  (NORCO) 10-325 MG tablet, Take 1 tablet by mouth every 6 (six) hours as needed for pain for up to 30 days, Disp: 150 tablet, Rfl: 0   HYDROcodone -acetaminophen  (NORCO) 10-325 MG tablet, Take 1 tablet by mouth every 4 (four) hours as needed for pain, Disp: 150 tablet, Rfl: 0   [START ON 08/03/2023] HYDROcodone -acetaminophen  (NORCO) 10-325 MG tablet, Take 1 tablet by mouth every 6 (six) hours as needed for pain for up to 30 days., Disp: 150 tablet, Rfl: 0   ketoconazole  (NIZORAL ) 2 % shampoo, Apply 1 Application topically 3 (three) times a week. Massage into scalp and leave in for 10 minutes before rinsing out, Disp: 120 mL, Rfl: 6   latanoprost  (XALATAN ) 0.005 % ophthalmic solution, SMARTSIG:In Eye(s), Disp: , Rfl:    levocetirizine (XYZAL ) 5 MG tablet, Take 1 tablet (5 mg total) by mouth every evening, Disp: 100 tablet, Rfl: 3   methocarbamol  (ROBAXIN ) 750 MG tablet, Take 750 mg by mouth 3 (three) times daily as needed for muscle spasms., Disp: , Rfl:    minoxidil  (LONITEN ) 2.5 MG tablet, Take 0.5 tablets (1.25 mg total) by mouth daily., Disp: 30 tablet, Rfl: 6  mirabegron  ER (MYRBETRIQ ) 50 MG TB24 tablet, Take 1 tablet (50 mg total) by mouth daily., Disp: 90 tablet, Rfl: 3   montelukast  (SINGULAIR ) 10 MG tablet, Take 1 tablet (10 mg total) by mouth at bedtime, Disp: 90 tablet, Rfl: 2   montelukast  (SINGULAIR ) 10 MG tablet, Take 1 tablet (10 mg total) by mouth at bedtime, Disp: 100 tablet, Rfl: 3    mupirocin  ointment (BACTROBAN ) 2 %, Apply 1 Application topically 2 (two) times daily., Disp: 22 g, Rfl: 0   nitrofurantoin , macrocrystal-monohydrate, (MACROBID ) 100 MG capsule, Take 1 capsule (100 mg total) by mouth daily as needed (after intercourse)., Disp: 60 capsule, Rfl: 1   omeprazole  (PRILOSEC) 40 MG capsule, Take 1 capsule (40 mg total) by mouth daily., Disp: 100 capsule, Rfl: 3   phenazopyridine  (PYRIDIUM ) 100 MG tablet, Take 1 tablet (100 mg total) by mouth 2 (two) times daily as needed for pain., Disp: 20 tablet, Rfl: 6   promethazine  (PHENERGAN ) 12.5 MG tablet, Take 1 tablet (12.5 mg total) by mouth every 8 (eight) hours as needed for nausea., Disp: 30 tablet, Rfl: 1   rosuvastatin  (CRESTOR ) 5 MG tablet, Take 1 tablet (5 mg total) by mouth daily., Disp: 100 tablet, Rfl: 3   Semaglutide , 1 MG/DOSE, (OZEMPIC , 1 MG/DOSE,) 4 MG/3ML SOPN, Inject 1 mg into the skin once a week., Disp: 3 mL, Rfl: 3   Semaglutide , 1 MG/DOSE, (OZEMPIC , 1 MG/DOSE,) 4 MG/3ML SOPN, Inject 1 mg into the skin once a week., Disp: 3 mL, Rfl: 11  Physical exam:  Vitals:   07/26/23 1114  BP: 128/64  Pulse: 72  Resp: 20  Temp: 98.6 F (37 C)  SpO2: 100%  Weight: 143 lb 9.6 oz (65.1 kg)   Physical Exam Constitutional:      Appearance: She is not ill-appearing.  HENT:     Nose: No congestion or rhinorrhea.  Eyes:     General: No scleral icterus. Pulmonary:     Effort: No respiratory distress.     Breath sounds: Normal breath sounds. No wheezing.  Neurological:     Mental Status: She is alert.        Latest Ref Rng & Units 07/26/2023   10:52 AM  CMP  Glucose 70 - 99 mg/dL 88   BUN 8 - 23 mg/dL 14   Creatinine 9.55 - 1.00 mg/dL 9.54   Sodium 864 - 854 mmol/L 135   Potassium 3.5 - 5.1 mmol/L 3.7   Chloride 98 - 111 mmol/L 103   CO2 22 - 32 mmol/L 24   Calcium  8.9 - 10.3 mg/dL 9.5   Total Protein 6.5 - 8.1 g/dL 7.3   Total Bilirubin 0.0 - 1.2 mg/dL 0.8   Alkaline Phos 38 - 126 U/L 54   AST 15 - 41  U/L 17   ALT 0 - 44 U/L 14       Latest Ref Rng & Units 07/26/2023   10:52 AM  CBC  WBC 4.0 - 10.5 K/uL 5.5   Hemoglobin 12.0 - 15.0 g/dL 86.2   Hematocrit 63.9 - 46.0 % 41.2   Platelets 150 - 400 K/uL 365    No results found.  Assessment and plan- Patient is a 68 y.o. female who presents to symptom management clinic for:   Methemoglobinemia- no current exacerbation. Would need to go to hospital for methylene blue  administration if symptomatic.  URI- likely viral etiology. No apparent asthma exacerbation. Continue maintenance inhaler with albuterol  for acute symptoms. No indication for antibiotics. Recommend  flonase  for nasal decongestant/supportive care.   Follow up as scheduled- la   Visit Diagnosis 1. Viral upper respiratory tract infection   2. Methemoglobinemia    Patient expressed understanding and was in agreement with this plan. She also understands that She can call clinic at any time with any questions, concerns, or complaints.   Thank you for allowing me to participate in the care of this very pleasant patient.   Tinnie Dawn, DNP, AGNP-C, AOCNP [facility] [phone]

## 2023-07-27 LAB — METHEMOGLOBIN, BLOOD: Methemoglobin, Blood: 1.7 % — ABNORMAL HIGH (ref 0.4–1.5)

## 2023-07-28 LAB — HAPTOGLOBIN: Haptoglobin: 84 mg/dL (ref 37–355)

## 2023-08-02 ENCOUNTER — Other Ambulatory Visit: Payer: Self-pay

## 2023-08-03 ENCOUNTER — Other Ambulatory Visit: Payer: Self-pay

## 2023-08-17 ENCOUNTER — Other Ambulatory Visit: Payer: Self-pay

## 2023-08-18 ENCOUNTER — Other Ambulatory Visit: Payer: Self-pay

## 2023-08-19 ENCOUNTER — Other Ambulatory Visit: Payer: Self-pay

## 2023-08-19 MED ORDER — HYDROCODONE-ACETAMINOPHEN 10-325 MG PO TABS
1.0000 | ORAL_TABLET | Freq: Four times a day (QID) | ORAL | 0 refills | Status: DC | PRN
Start: 2023-09-02 — End: 2023-10-04

## 2023-08-19 MED ORDER — HYDROCODONE-ACETAMINOPHEN 10-325 MG PO TABS: 1.0000 | ORAL_TABLET | Freq: Four times a day (QID) | ORAL | 0 refills | Status: DC | PRN

## 2023-08-19 MED ORDER — OMEPRAZOLE 40 MG PO CPDR
40.0000 mg | DELAYED_RELEASE_CAPSULE | Freq: Every day | ORAL | 3 refills | Status: AC
  Filled 2023-08-19: qty 90, 90d supply, fill #0
  Filled 2023-08-30: qty 100, 100d supply, fill #0
  Filled 2023-10-10 – 2023-11-22 (×4): qty 100, 100d supply, fill #1
  Filled 2024-01-04 – 2024-01-24 (×4): qty 100, 100d supply, fill #2

## 2023-08-19 MED ORDER — HYDROCODONE-ACETAMINOPHEN 10-325 MG PO TABS
1.0000 | ORAL_TABLET | ORAL | 0 refills | Status: AC | PRN
  Filled 2023-10-03 (×2): qty 150, 25d supply, fill #0

## 2023-08-25 ENCOUNTER — Other Ambulatory Visit: Payer: Self-pay

## 2023-08-25 MED ORDER — HYDROCODONE-ACETAMINOPHEN 10-325 MG PO TABS
1.0000 | ORAL_TABLET | Freq: Four times a day (QID) | ORAL | 0 refills | Status: DC | PRN
Start: 2023-09-01 — End: 2023-10-03
  Filled 2023-09-01 (×5): qty 150, 38d supply, fill #0

## 2023-08-29 ENCOUNTER — Other Ambulatory Visit: Payer: Self-pay

## 2023-08-30 ENCOUNTER — Inpatient Hospital Stay (HOSPITAL_BASED_OUTPATIENT_CLINIC_OR_DEPARTMENT_OTHER): Payer: PPO | Admitting: Nurse Practitioner

## 2023-08-30 ENCOUNTER — Other Ambulatory Visit: Payer: Self-pay

## 2023-08-30 ENCOUNTER — Inpatient Hospital Stay: Payer: PPO | Attending: Nurse Practitioner

## 2023-08-30 ENCOUNTER — Encounter: Payer: Self-pay | Admitting: Nurse Practitioner

## 2023-08-30 VITALS — BP 138/75 | HR 77 | Temp 98.6°F | Resp 20 | Wt 142.1 lb

## 2023-08-30 DIAGNOSIS — D749 Methemoglobinemia, unspecified: Secondary | ICD-10-CM | POA: Insufficient documentation

## 2023-08-30 DIAGNOSIS — Z79899 Other long term (current) drug therapy: Secondary | ICD-10-CM | POA: Diagnosis not present

## 2023-08-30 DIAGNOSIS — D599 Acquired hemolytic anemia, unspecified: Secondary | ICD-10-CM

## 2023-08-30 LAB — CBC WITH DIFFERENTIAL (CANCER CENTER ONLY)
Abs Immature Granulocytes: 0.01 K/uL (ref 0.00–0.07)
Basophils Absolute: 0.1 K/uL (ref 0.0–0.1)
Basophils Relative: 1 %
Eosinophils Absolute: 0.1 K/uL (ref 0.0–0.5)
Eosinophils Relative: 3 %
HCT: 43.9 % (ref 36.0–46.0)
Hemoglobin: 14.6 g/dL (ref 12.0–15.0)
Immature Granulocytes: 0 %
Lymphocytes Relative: 28 %
Lymphs Abs: 1.6 K/uL (ref 0.7–4.0)
MCH: 31.3 pg (ref 26.0–34.0)
MCHC: 33.3 g/dL (ref 30.0–36.0)
MCV: 94.2 fL (ref 80.0–100.0)
Monocytes Absolute: 0.4 K/uL (ref 0.1–1.0)
Monocytes Relative: 7 %
Neutro Abs: 3.5 K/uL (ref 1.7–7.7)
Neutrophils Relative %: 61 %
Platelet Count: 309 K/uL (ref 150–400)
RBC: 4.66 MIL/uL (ref 3.87–5.11)
RDW: 12.9 % (ref 11.5–15.5)
WBC Count: 5.7 K/uL (ref 4.0–10.5)
nRBC: 0 % (ref 0.0–0.2)

## 2023-08-30 LAB — CMP (CANCER CENTER ONLY)
ALT: 37 U/L (ref 0–44)
AST: 31 U/L (ref 15–41)
Albumin: 4.6 g/dL (ref 3.5–5.0)
Alkaline Phosphatase: 60 U/L (ref 38–126)
Anion gap: 9 (ref 5–15)
BUN: 14 mg/dL (ref 8–23)
CO2: 27 mmol/L (ref 22–32)
Calcium: 9.2 mg/dL (ref 8.9–10.3)
Chloride: 99 mmol/L (ref 98–111)
Creatinine: 0.51 mg/dL (ref 0.44–1.00)
GFR, Estimated: >60 mL/min (ref >60)
Glucose, Bld: 91 mg/dL (ref 70–99)
Potassium: 4.2 mmol/L (ref 3.5–5.1)
Sodium: 135 mmol/L (ref 135–145)
Total Bilirubin: 0.6 mg/dL (ref 0.0–1.2)
Total Protein: 7.1 g/dL (ref 6.5–8.1)

## 2023-08-30 LAB — RETICULOCYTES
Immature Retic Fract: 4 % (ref 2.3–15.9)
RBC.: 4.63 MIL/uL (ref 3.87–5.11)
Retic Count, Absolute: 54.6 K/uL (ref 19.0–186.0)
Retic Ct Pct: 1.2 % (ref 0.4–3.1)

## 2023-08-30 NOTE — Progress Notes (Signed)
 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  She also has a history of iron deficiency and folate acid deficiency.  Patient was admitted for bilateral pneumonia in August 2022 at that time she was treated with antibiotics.  At that time she was evaluated for a hemoglobin of 6.6 and underwent a comprehensive work-up which showed normal ferritin of 183 with a normal iron saturation folate level normal at 18.8 B12 levels normal at 494 immunoglobulins were normal haptoglobin was low initially at less than 10 and subsequently in May 2023 it was still low at 16 LDH has been normal.  She has had G6PD levels checked 3 different occasions over the last 1 month and they have been within normal limits.  Coombs test in April 2023 was negative.  PNH profile testing negative.  Her hemoglobin which was 6.6 in August 2022 subsequently recovered to 10.5-11 over the next 6 months.  Hemoglobinopathy evaluation negative. 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 She did not require steroids or other treatment for her hemolytic anemia.    Patient presented to the ER with symptoms of shortness of breath and her lips turning purple in June 2023.  She was given Azo and Macrobid  recently for UTI. Patient had a coexemetry panel performed in the hospital which  showed an elevated methemoglobin level of 24.7%.  Carboxyhemoglobin level less than 0.3 and oxygen  saturation on that sample was 95.4% patient was given methylene blue  with improvement of her cyanosis.  No exposure to well water .  No other recent changes in medications.  Patient then had a second episode of methemoglobinemia that was less severe a few weeks later and patient presented to [facility].  Symptoms resolved with conservative management and did not require methylene blue .  Azo was thought to be the culprit drug.  Patient was also seen for a second opinion with [facility] hematology.  Conservative management was recommended with avoidance of possible drugs that can induce methemoglobinemia and need for methylene blue  as indicated for acute episodes.  Levaquin  which causes drug-induced hemolysis Penicillin and cephalosporins for which she gets anaphylaxis Bactrim , Azo and nitrofurantoin  both of which cause methemoglobinemia   Interval history-patient is a 68 year old female who returns to clinic today for labs and routine follow-up.  In the interim, she was seen by PCP for shortness of breath, hoarseness, fatigue and treated conservatively.  Her oxygen  levels have been normal and she has not required any additional methylene blue . 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 Today she feels at baseline and denies other complaints.   ECOG PS- 0 Pain scale- 0  Review of systems- Review of Systems  Constitutional:  Negative for chills, fever, malaise/fatigue and weight loss.  HENT:  Negative for congestion, ear discharge and nosebleeds.   Eyes:  Negative for blurred vision.  Respiratory:  Negative for cough, hemoptysis, sputum production, shortness of breath and wheezing.   Cardiovascular:  Negative for  chest pain, palpitations, orthopnea and claudication.  Gastrointestinal:  Negative for abdominal pain, blood in stool, constipation, diarrhea, heartburn, melena, nausea and  vomiting.  Genitourinary:  Negative for dysuria, flank pain, frequency, hematuria and urgency.  Musculoskeletal:  Negative for back pain, joint pain and myalgias.  Skin:  Negative for rash.  Neurological:  Negative for dizziness, tingling, focal weakness, seizures, weakness and headaches.  Endo/Heme/Allergies:  Does not bruise/bleed easily.  Psychiatric/Behavioral:  Negative for depression and suicidal ideas. The patient does not have insomnia.     Allergies  Allergen Reactions   Cephalexin Anaphylaxis   Cephalosporins Anaphylaxis and Other (See Comments)   Macrobid  [Nitrofurantoin  Macrocrystal] Other (See Comments)    Causes methemoglobinemia   Penicillins Anaphylaxis and Other (See Comments)    Has patient had a PCN reaction causing immediate rash, facial/tongue/throat swelling, SOB or lightheadedness with hypotension: Yes Has patient had a PCN reaction causing severe rash involving mucus membranes or skin necrosis: No Has patient had a PCN reaction that required hospitalization Yes Has patient had a PCN reaction occurring within the last 10 years: No If all of the above answers are NO, then may proceed with Cephalosporin use.    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   Asthma    Blood transfusion without reported diagnosis YRS AGO   COVID-19 03/2018   Depression    Gastric ulcer, unspecified as acute or chronic, without mention of hemorrhage, perforation, or obstruction YRS AGO   GERD (gastroesophageal reflux disease)    Hyperlipidemia    Hypertension    Methemoglobinemia    Migraine    hx of migraines   Obesity, unspecified    Other chronic cystitis    Personal history of  colonic polyps    Pneumonia    PONV (postoperative nausea and vomiting)    NAUSEA, SCOPOLAMINE  PATCH HELPED WITH LAST SURGERIES   Pre-diabetes    Past Surgical History:  Procedure Laterality Date   ABDOMINAL HYSTERECTOMY  01/25/1994   uterine fibroids; DUB; cervical dysplasia; ovaries resected two years later.     APPENDECTOMY     BILATERAL OOPHORECTOMY  2001   BREAST BIOPSY Left 10/08/2021   10:00 ribbon, us  bx, path- FRAGMENTS OF BENIGN FIBROADENOMA. - BACKGROUND MAMMARY PARENCHYMA WITH FIBROCYSTIC CHANGES AND PSEUDOANGIOMATOUS STROMAL HYPERPLASIA. - CALCIFICATIONS ASSOCIATED WITH BENIGN MAMMARY ELEMENTS. - NEGATIVE FOR ATYPICAL PROLIFERATIVE BREAST DISEASE.   CESAREAN SECTION     x 2   COLON SURGERY  YRS AGO   SIGMOIDECTOMY   Colonoscopy  09/08/2012   diverticulosis, hemorrhoids.  [name].  Symptoms:  anemia, hemoccult +.   ESOPHAGOGASTRODUODENOSCOPY  09/08/2012   +HH.  [name].  Symptoms: anemia, hemoccult +.   HIP ARTHROSCOPY Right    LAPAROSCOPIC GASTRIC SLEEVE RESECTION N/A 03/01/2016   Procedure: LAPAROSCOPIC GASTRIC SLEEVE RESECTION, UPPER ENDO;  Surgeon: Camellia Blush, MD;  Location: [facility];  Service: General;  Laterality: N/A;   LAPAROSCOPIC LYSIS OF ADHESIONS N/A 02/10/2016   Procedure: LAPAROSCOPIC LYSIS OF ADHESIONS;  Surgeon: Camellia Blush, MD;  Location: [facility];  Service: General;  Laterality: N/A;   LAPAROSCOPY N/A 02/10/2016   Procedure: LAPAROSCOPY DIAGNOSTIC;  Surgeon: Camellia Blush, MD;  Location: [facility];  Service: General;  Laterality: N/A;   NASAL SEPTUM SURGERY     NECK SURGERY     Rheumatology Consult  11/26/2011   multiple arthralgias. Autoimmune labs negative.  Rx for Plaquenil for non-specific autoimmune process.  [facility].   SHOULDER ARTHROSCOPY WITH OPEN ROTATOR CUFF REPAIR AND DISTAL CLAVICLE ACROMINECTOMY Right 01/13/2021   Procedure: SHOULDER ARTHROSCOPY WITH OPEN ROTATOR CUFF REPAIR AND DISTAL CLAVICLE ACROMINECTOMY;  Surgeon: Marchia Drivers, MD;   Location: [facility];  Service: Orthopedics;  Laterality: Right;   SHOULDER ARTHROSCOPY WITH SUBACROMIAL DECOMPRESSION Right 01/13/2021   Procedure: SHOULDER ARTHROSCOPY WITH SUBACROMIAL DECOMPRESSION, BICEPS TENOTOMY;  Surgeon: Marchia Drivers, MD;  Location: [facility];  Service: Orthopedics;  Laterality: Right;   SIGMOID RESECTION / RECTOPEXY     SPINE SURGERY     TONSILLECTOMY AND ADENOIDECTOMY     TOTAL HIP ARTHROPLASTY Right 09/27/2017   Procedure: TOTAL HIP ARTHROPLASTY ANTERIOR APPROACH;  Surgeon: Kathlynn Sharper, MD;  Location: [facility];  Service: Orthopedics;  Laterality: Right;   TOTAL HIP ARTHROPLASTY Left 12/27/2017   Procedure: TOTAL HIP ARTHROPLASTY ANTERIOR APPROACH;  Surgeon: Kathlynn Sharper, MD;  Location: [facility];  Service: Orthopedics;  Laterality: Left;   Social History   Socioeconomic History   Marital status: Married    Spouse name: Not on file   Number of children: 2   Years of education: 12   Highest education level: Not on file  Occupational History   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    Employer: [employer]    Comment: Dermatology office  Tobacco Use   Smoking status: Former    Current packs/day: 1.00    Average packs/day: 1 pack/day for 5.0 years (5.0 ttl pk-yrs)    Types: Cigarettes    Passive exposure: Past   Smokeless tobacco: Never   Tobacco comments:    Quit 32 years ago  Vaping Use   Vaping status: Never Used  Substance and Sexual Activity   Alcohol use: Yes    Comment: rare   Drug use: No   Sexual activity: Yes    Birth control/protection: Post-menopausal, Surgical  Other Topics Concern   Not on file  Social History Narrative   Marital status: married since 32 years; happily married; no abuse.      Children: 2 sons (24, 81); 2 grandchildren  (5yo Atlast, 3 yo August)      Lives: with husband, [name]/son.      Employment: check in at [employer] in [place] x 2004; loves job.      Tobacco abuse:  Former smoker.       Alcohol:  None; holidays only      Exercise: none in 2017     Seatbelt: 100%; no texting   Social Drivers of Corporate investment banker Strain: Low Risk  (07/21/2021)   Received from [facility]   Overall Financial Resource Strain (CARDIA)    Difficulty of Paying Living Expenses: Not hard at all  Food Insecurity: No Food Insecurity (07/21/2021)   Received from [facility]   Hunger Vital Sign    Within the past 12 months, you worried that your food would run out before you got the money to buy more.: Never true    Within the past 12 months, the food you bought just didn't last and you didn't have money to get more.: Never true  Transportation Needs: No Transportation Needs (07/21/2021)   Received from [facility] - Transportation  Lack of Transportation (Medical): No    Lack of Transportation (Non-Medical): No  Physical Activity: Sufficiently Active (07/21/2021)   Received from [facility]   Exercise Vital Sign    On average, how many days per week do you engage in moderate to strenuous exercise (like a brisk walk)?: 5 days    On average, how many minutes do you engage in exercise at this level?: 30 min  Stress: Stress Concern Present (07/21/2021)   Received from [facility] - Occupational Stress Questionnaire    Feeling of Stress : To some extent  Social Connections: Socially Integrated (07/21/2021)   Received from [facility]   Social Connection and Isolation Panel    In a typical week, how many times do you talk on the phone with family, friends, or neighbors?: More than three times a week    How often do you get together with friends or relatives?: More than three times a week    How often do you attend church or religious services?: 1 to 4 times per year    Do you belong to any clubs or organizations such as church groups, unions, fraternal or athletic groups, or school groups?: Yes    How often do  you attend meetings of the clubs or organizations you belong to?: Never    Are you married, widowed, divorced, separated, never married, or living with a partner?: Married  Catering manager Violence: Not on file   Family History  Problem Relation Age of Onset   Bipolar disorder Father    Transient ischemic attack Father    Dementia Father        secondary to head trauma   Hypertension Father    Diabetes Father    CAD Father    Cancer Mother 41       lung   Migraines Mother    Hypothyroidism Mother    Hypertension Sister    Atrial fibrillation Sister    Hypothyroidism Sister    Alcohol abuse Brother    Hypertension Brother    Alcohol abuse Brother    Atrial fibrillation Maternal Grandmother    COPD Maternal Grandmother    Diabetes Maternal Grandmother    Hyperlipidemia Maternal Grandmother    Diabetes Maternal Grandfather    Diabetes Paternal Grandfather    Heart disease Paternal Grandfather    Breast cancer Sister 84    Current Outpatient Medications:    albuterol  (VENTOLIN  HFA) 108 (90 Base) MCG/ACT inhaler, Inhale 2 puffs into the lungs every 4 (four) hours as needed for Wheezing or Shortness of Breath, Disp: 18 g, Rfl: 3   azelastine  (ASTELIN ) 0.1 % nasal spray, Place 1-2 sprays into both nostrils 2 (two) times daily., Disp: 30 mL, Rfl: 12   azithromycin  (ZITHROMAX ) 250 MG tablet, Take 250 mg by mouth as directed., Disp: , Rfl:    azithromycin  (ZITHROMAX ) 250 MG tablet, Take 2 tablets (500mg ) by mouth on Day 1, then take 1 tablet (250mg ) by mouth daily on Days 2-5., Disp: 6 tablet, Rfl: 0   budesonide -formoterol  (SYMBICORT ) 160-4.5 MCG/ACT inhaler, Inhale 2 puffs into the lungs 2 (two) times daily., Disp: 10.2 g, Rfl: 5   busPIRone  (BUSPAR ) 15 MG tablet, Take 1 tablet (15 mg total) by mouth 3 (three) times daily., Disp: 270 tablet, Rfl: 3   DULoxetine  (CYMBALTA ) 60 MG capsule, Take 1 capsule (60 mg total) by mouth 2 (two) times daily., Disp: 180 capsule, Rfl: 4  DULoxetine  (CYMBALTA ) 60 MG capsule, Take 1 capsule (60 mg total) by mouth 2 (two) times daily., Disp: 200 capsule, Rfl: 3   dutasteride  (AVODART ) 0.5 MG capsule, Take 1 capsule (0.5 mg total) by mouth daily., Disp: 30 capsule, Rfl: 6   estradiol  (ESTRACE ) 0.1 MG/GM vaginal cream, Place 2 g vaginally twice a week, Disp: 42.5 g, Rfl: 4   fluticasone  (FLONASE ) 50 MCG/ACT nasal spray, Place 2 sprays into both nostrils daily., Disp: 16 g, Rfl: 11   fluticasone  furoate-vilanterol (BREO ELLIPTA ) 200-25 MCG/ACT AEPB, Inhale 1 puff into the lungs daily., Disp: 60 each, Rfl: 5   folic acid  (FOLVITE ) 1 MG tablet, Take 1 tablet (1 mg total) by mouth daily., Disp: 30 tablet, Rfl: 3   gabapentin  (NEURONTIN ) 300 MG capsule, Take 1-3 capsules (300-900 mg total) by mouth 3 (three) times daily., Disp: 810 capsule, Rfl: 0   HYDROcodone -acetaminophen  (NORCO) 10-325 MG tablet, Take 1 tablet by mouth every 4 (four) hours as needed for pain., Disp: 150 tablet, Rfl: 0   HYDROcodone -acetaminophen  (NORCO) 10-325 MG tablet, Take 1 tablet by mouth every 4 (four) hours as needed for pain., Disp: 150 tablet, Rfl: 0   HYDROcodone -acetaminophen  (NORCO) 10-325 MG tablet, Take 1 tablet by mouth every 4 (four) hours as needed for Pain., Disp: 150 tablet, Rfl: 0   HYDROcodone -acetaminophen  (NORCO) 10-325 MG tablet, Take 1 tablet by mouth every 6 (six) hours as needed for Pain for up to 30 days, Disp: 150 tablet, Rfl: 0   HYDROcodone -acetaminophen  (NORCO) 10-325 MG tablet, Take 1 tablet by mouth every 4 (four) hours as needed for Pain, Disp: 150 tablet, Rfl: 0   HYDROcodone -acetaminophen  (NORCO) 10-325 MG tablet, Take 1 tablet by mouth every 4 (four) hours as needed for pain., Disp: 150 tablet, Rfl: 0   HYDROcodone -acetaminophen  (NORCO) 10-325 MG tablet, Take 1 tablet by mouth every 6 (six) hours as needed for pain for up to 30 days, Disp: 150 tablet, Rfl: 0   HYDROcodone -acetaminophen  (NORCO) 10-325 MG tablet, Take 1 tablet by mouth  every 4 (four) hours as needed for pain, Disp: 150 tablet, Rfl: 0   HYDROcodone -acetaminophen  (NORCO) 10-325 MG tablet, Take 1 tablet by mouth every 6 (six) hours as needed for pain for up to 30 days., Disp: 150 tablet, Rfl: 0   [START ON 10/03/2023] HYDROcodone -acetaminophen  (NORCO) 10-325 MG tablet, Take 1 tablet by mouth every 4 (four) hours as needed., Disp: 150 tablet, Rfl: 0   [START ON 09/02/2023] HYDROcodone -acetaminophen  (NORCO) 10-325 MG tablet, Take 1 tablet by mouth every 6 (six) hours as needed., Disp: 150 tablet, Rfl: 0   [START ON 11/01/2023] HYDROcodone -acetaminophen  (NORCO) 10-325 MG tablet, Take 1 tablet by mouth every 6 (six) hours as needed., Disp: 150 tablet, Rfl: 0   [START ON 09/01/2023] HYDROcodone -acetaminophen  (NORCO) 10-325 MG tablet, Take 1 tablet by mouth every 6 (six) hours as needed for Pain for up to 30 days, Disp: 150 tablet, Rfl: 0   ketoconazole  (NIZORAL ) 2 % shampoo, Apply 1 Application topically 3 (three) times a week. Massage into scalp and leave in for 10 minutes before rinsing out, Disp: 120 mL, Rfl: 6   latanoprost  (XALATAN ) 0.005 % ophthalmic solution, SMARTSIG:In Eye(s), Disp: , Rfl:    levocetirizine (XYZAL ) 5 MG tablet, Take 1 tablet (5 mg total) by mouth every evening, Disp: 100 tablet, Rfl: 3   methocarbamol  (ROBAXIN ) 750 MG tablet, Take 750 mg by mouth 3 (three) times daily as needed for muscle spasms., Disp: , Rfl:    minoxidil  (LONITEN )  2.5 MG tablet, Take 0.5 tablets (1.25 mg total) by mouth daily., Disp: 30 tablet, Rfl: 6   mirabegron  ER (MYRBETRIQ ) 50 MG TB24 tablet, Take 1 tablet (50 mg total) by mouth daily., Disp: 90 tablet, Rfl: 3   montelukast  (SINGULAIR ) 10 MG tablet, Take 1 tablet (10 mg total) by mouth at bedtime, Disp: 90 tablet, Rfl: 2   montelukast  (SINGULAIR ) 10 MG tablet, Take 1 tablet (10 mg total) by mouth at bedtime, Disp: 100 tablet, Rfl: 3   mupirocin  ointment (BACTROBAN ) 2 %, Apply 1 Application topically 2 (two) times daily., Disp: 22 g,  Rfl: 0   nitrofurantoin , macrocrystal-monohydrate, (MACROBID ) 100 MG capsule, Take 1 capsule (100 mg total) by mouth daily as needed (after intercourse)., Disp: 60 capsule, Rfl: 1   omeprazole  (PRILOSEC) 40 MG capsule, Take 1 capsule (40 mg total) by mouth daily., Disp: 100 capsule, Rfl: 3   omeprazole  (PRILOSEC) 40 MG capsule, Take 1 capsule (40 mg total) by mouth daily., Disp: 100 capsule, Rfl: 3   phenazopyridine  (PYRIDIUM ) 100 MG tablet, Take 1 tablet (100 mg total) by mouth 2 (two) times daily as needed for pain., Disp: 20 tablet, Rfl: 6   predniSONE  (DELTASONE ) 20 MG tablet, Take 2 tablets (40 mg total) by mouth daily for 5 days, Disp: 10 tablet, Rfl: 0   promethazine  (PHENERGAN ) 12.5 MG tablet, Take 1 tablet (12.5 mg total) by mouth every 8 (eight) hours as needed for nausea., Disp: 30 tablet, Rfl: 1   rosuvastatin  (CRESTOR ) 5 MG tablet, Take 1 tablet (5 mg total) by mouth daily., Disp: 100 tablet, Rfl: 3   Semaglutide , 1 MG/DOSE, (OZEMPIC , 1 MG/DOSE,) 4 MG/3ML SOPN, Inject 1 mg into the skin once a week., Disp: 3 mL, Rfl: 3   Semaglutide , 1 MG/DOSE, (OZEMPIC , 1 MG/DOSE,) 4 MG/3ML SOPN, Inject 1 mg into the skin once a week., Disp: 3 mL, Rfl: 11  Physical exam:  Vitals:   08/30/23 1002  BP: 138/75  Pulse: 77  Resp: 20  Temp: 98.6 F (37 C)  SpO2: 100%  Weight: 142 lb 1.6 oz (64.5 kg)   Physical Exam Constitutional:      Appearance: She is not ill-appearing.  Cardiovascular:     Rate and Rhythm: Normal rate and regular rhythm.  Pulmonary:     Effort: Pulmonary effort is normal.  Abdominal:     General: There is no distension.  Skin:    General: Skin is warm and dry.     Comments: No cyanosis   Neurological:     Mental Status: She is alert and oriented to person, place, and time.  Psychiatric:        Mood and Affect: Mood normal.        Behavior: Behavior normal.        Latest Ref Rng & Units 08/30/2023    9:50 AM  CMP  Glucose 70 - 99 mg/dL 91   BUN 8 - 23 mg/dL 14    Creatinine 9.55 - 1.00 mg/dL 9.48   Sodium 864 - 854 mmol/L 135   Potassium 3.5 - 5.1 mmol/L 4.2   Chloride 98 - 111 mmol/L 99   CO2 22 - 32 mmol/L 27   Calcium  8.9 - 10.3 mg/dL 9.2   Total Protein 6.5 - 8.1 g/dL 7.1   Total Bilirubin 0.0 - 1.2 mg/dL 0.6   Alkaline Phos 38 - 126 U/L 60   AST 15 - 41 U/L 31   ALT 0 - 44 U/L 37  Latest Ref Rng & Units 08/30/2023    9:50 AM  CBC  WBC 4.0 - 10.5 K/uL 5.7   Hemoglobin 12.0 - 15.0 g/dL 85.3   Hematocrit 63.9 - 46.0 % 43.9   Platelets 150 - 400 K/uL 309    No images are attached to the encounter.  No results found.   Assessment and plan- Patient is a 68 y.o. female who returns to clinic for follow up of her:   Intermittent methemoglobinemia- triggered by antibiotics and resultant hemolytic anemia. Hemoglobin remains normal with normal retic counts. No interval episodes of methomoglobinemia. She underwent CYB5R3 testing with [facility] but unable to see results. She will continue to avoid known triggers. Discussed that if she develops shortness of breath or cyanosis in the setting of known medications she may need to go to ER for administration of methylene blue  for treatment of her methemoglobinemia. She will need to take folate during her episodes of recovery from methemoglobinemia. We will continue to follow up every 6 months with labs (cbc, cmp, retic panel).   Visit Diagnosis 1. Methemoglobinemia     Tinnie Dawn, DNP, AGNP-C, [facility] [phone] (clinic) 08/30/2023

## 2023-08-31 ENCOUNTER — Other Ambulatory Visit: Payer: Self-pay

## 2023-09-01 ENCOUNTER — Other Ambulatory Visit: Payer: Self-pay

## 2023-09-01 MED ORDER — AZELASTINE HCL 0.1 % NA SOLN
2.0000 | Freq: Two times a day (BID) | NASAL | 10 refills | Status: AC
  Filled 2023-09-01: qty 30, 50d supply, fill #0
  Filled 2023-10-10: qty 30, 50d supply, fill #1
  Filled 2023-11-01 – 2023-11-22 (×2): qty 30, 50d supply, fill #2
  Filled 2024-01-19: qty 30, 50d supply, fill #3

## 2023-09-01 MED ORDER — GABAPENTIN 300 MG PO CAPS
300.0000 mg | ORAL_CAPSULE | Freq: Three times a day (TID) | ORAL | 4 refills | Status: AC
  Filled 2023-09-01: qty 810, 90d supply, fill #0
  Filled 2023-11-25: qty 810, 90d supply, fill #1
  Filled 2024-01-04 – 2024-02-23 (×2): qty 810, 90d supply, fill #2

## 2023-09-02 ENCOUNTER — Other Ambulatory Visit: Payer: Self-pay

## 2023-09-14 ENCOUNTER — Other Ambulatory Visit: Payer: Self-pay

## 2023-09-15 ENCOUNTER — Other Ambulatory Visit: Payer: Self-pay

## 2023-09-15 MED ORDER — BUSPIRONE HCL 15 MG PO TABS
15.0000 mg | ORAL_TABLET | Freq: Two times a day (BID) | ORAL | 0 refills | Status: AC
  Filled 2023-09-15 – 2023-10-10 (×2): qty 180, 90d supply, fill #0

## 2023-09-27 ENCOUNTER — Other Ambulatory Visit: Payer: Self-pay

## 2023-10-03 ENCOUNTER — Other Ambulatory Visit: Payer: Self-pay

## 2023-10-03 MED ORDER — HYDROCODONE-ACETAMINOPHEN 10-325 MG PO TABS
1.0000 | ORAL_TABLET | ORAL | 0 refills | Status: AC | PRN
  Filled 2023-11-01: qty 150, 30d supply, fill #0

## 2023-10-04 ENCOUNTER — Other Ambulatory Visit: Payer: Self-pay

## 2023-10-06 ENCOUNTER — Telehealth: Payer: Self-pay

## 2023-10-06 NOTE — Telephone Encounter (Signed)
 Okay to refill Minoxidil and Finasteride?

## 2023-10-10 ENCOUNTER — Other Ambulatory Visit: Payer: Self-pay

## 2023-10-10 MED ORDER — MINOXIDIL 2.5 MG PO TABS
1.2500 mg | ORAL_TABLET | Freq: Every day | ORAL | 5 refills | Status: AC
  Filled 2023-10-10 – 2023-11-01 (×5): qty 30, 60d supply, fill #0
  Filled 2023-11-07 – 2023-12-16 (×3): qty 30, 60d supply, fill #1
  Filled 2024-01-04 – 2024-02-14 (×4): qty 30, 60d supply, fill #2

## 2023-10-10 MED ORDER — DUTASTERIDE 0.5 MG PO CAPS
0.5000 mg | ORAL_CAPSULE | Freq: Every day | ORAL | 5 refills | Status: AC
  Filled 2023-10-10: qty 30, 30d supply, fill #0
  Filled 2023-11-01 – 2023-11-02 (×2): qty 30, 30d supply, fill #1
  Filled 2023-12-16: qty 30, 30d supply, fill #2
  Filled 2024-01-04 – 2024-01-19 (×2): qty 30, 30d supply, fill #3
  Filled 2024-02-23: qty 30, 30d supply, fill #4

## 2023-10-10 NOTE — Telephone Encounter (Signed)
 Correction, patient needed refills of dutasteride .  RX sent to pharmacy. aw

## 2023-10-11 ENCOUNTER — Other Ambulatory Visit: Payer: Self-pay

## 2023-10-12 ENCOUNTER — Other Ambulatory Visit: Payer: Self-pay

## 2023-10-13 ENCOUNTER — Other Ambulatory Visit: Payer: Self-pay

## 2023-10-26 ENCOUNTER — Other Ambulatory Visit: Payer: Self-pay

## 2023-10-26 MED ORDER — NYSTATIN 100000 UNIT/ML MT SUSP
5.0000 mL | Freq: Four times a day (QID) | OROMUCOSAL | 0 refills | Status: AC
  Filled 2023-10-26: qty 200, 10d supply, fill #0

## 2023-10-27 ENCOUNTER — Other Ambulatory Visit: Payer: Self-pay

## 2023-10-27 MED ORDER — BELSOMRA 10 MG PO TABS
10.0000 mg | ORAL_TABLET | Freq: Every day | ORAL | 5 refills | Status: AC
  Filled 2023-10-27: qty 30, 30d supply, fill #0
  Filled 2023-11-21 – 2023-11-25 (×2): qty 30, 30d supply, fill #1
  Filled 2024-01-19: qty 30, 30d supply, fill #2

## 2023-10-31 ENCOUNTER — Other Ambulatory Visit: Payer: Self-pay

## 2023-11-01 ENCOUNTER — Other Ambulatory Visit: Payer: Self-pay

## 2023-11-07 ENCOUNTER — Other Ambulatory Visit: Payer: Self-pay

## 2023-11-08 ENCOUNTER — Other Ambulatory Visit: Payer: Self-pay

## 2023-11-14 ENCOUNTER — Ambulatory Visit: Payer: Self-pay | Admitting: Urology

## 2023-11-21 ENCOUNTER — Other Ambulatory Visit: Payer: Self-pay

## 2023-11-22 ENCOUNTER — Other Ambulatory Visit: Payer: Self-pay

## 2023-11-22 ENCOUNTER — Encounter: Payer: Self-pay | Admitting: Urology

## 2023-11-25 ENCOUNTER — Other Ambulatory Visit: Payer: Self-pay

## 2023-11-29 ENCOUNTER — Other Ambulatory Visit: Payer: Self-pay

## 2023-11-29 MED ORDER — PROMETHAZINE HCL 12.5 MG PO TABS
12.5000 mg | ORAL_TABLET | Freq: Three times a day (TID) | ORAL | 0 refills | Status: DC | PRN
  Filled 2023-11-29: qty 30, 10d supply, fill #0

## 2023-11-30 ENCOUNTER — Other Ambulatory Visit: Payer: Self-pay

## 2023-11-30 MED ORDER — HYDROCODONE-ACETAMINOPHEN 10-325 MG PO TABS
1.0000 | ORAL_TABLET | ORAL | 0 refills | Status: DC | PRN
  Filled 2024-01-27: qty 150, 30d supply, fill #0

## 2023-11-30 MED ORDER — HYDROCODONE-ACETAMINOPHEN 10-325 MG PO TABS
ORAL_TABLET | ORAL | 0 refills | Status: AC
  Filled 2023-12-01: qty 150, 30d supply, fill #0

## 2023-11-30 MED ORDER — HYDROCODONE-ACETAMINOPHEN 10-325 MG PO TABS
1.0000 | ORAL_TABLET | ORAL | 0 refills | Status: AC | PRN
  Filled 2023-12-30: qty 150, 30d supply, fill #0

## 2023-12-01 ENCOUNTER — Other Ambulatory Visit: Payer: Self-pay

## 2023-12-02 ENCOUNTER — Other Ambulatory Visit: Payer: Self-pay

## 2023-12-13 ENCOUNTER — Other Ambulatory Visit: Payer: Self-pay

## 2023-12-13 MED ORDER — PREDNISONE 10 MG PO TABS
ORAL_TABLET | ORAL | 0 refills | Status: AC
  Filled 2023-12-13: qty 48, 12d supply, fill #0

## 2023-12-13 MED ORDER — CLARITHROMYCIN 500 MG PO TABS
500.0000 mg | ORAL_TABLET | Freq: Two times a day (BID) | ORAL | 0 refills | Status: AC
  Filled 2023-12-13: qty 28, 14d supply, fill #0

## 2023-12-14 ENCOUNTER — Other Ambulatory Visit: Payer: Self-pay

## 2023-12-14 MED ORDER — BENZONATATE 200 MG PO CAPS
200.0000 mg | ORAL_CAPSULE | Freq: Three times a day (TID) | ORAL | 1 refills | Status: AC | PRN
  Filled 2023-12-14: qty 60, 20d supply, fill #0
  Filled 2024-01-19: qty 60, 20d supply, fill #1

## 2023-12-16 ENCOUNTER — Other Ambulatory Visit: Payer: Self-pay

## 2023-12-17 ENCOUNTER — Other Ambulatory Visit: Payer: Self-pay

## 2023-12-18 ENCOUNTER — Other Ambulatory Visit: Payer: Self-pay

## 2023-12-20 ENCOUNTER — Other Ambulatory Visit: Payer: Self-pay

## 2023-12-21 ENCOUNTER — Other Ambulatory Visit: Payer: Self-pay

## 2023-12-29 ENCOUNTER — Other Ambulatory Visit: Payer: Self-pay

## 2023-12-30 ENCOUNTER — Other Ambulatory Visit: Payer: Self-pay

## 2024-01-04 ENCOUNTER — Other Ambulatory Visit: Payer: Self-pay

## 2024-01-05 ENCOUNTER — Other Ambulatory Visit: Payer: Self-pay

## 2024-01-05 MED ORDER — BUDESONIDE-FORMOTEROL FUMARATE 160-4.5 MCG/ACT IN AERO
2.0000 | INHALATION_SPRAY | Freq: Two times a day (BID) | RESPIRATORY_TRACT | 5 refills | Status: AC | PRN
  Filled 2024-01-05 (×3): qty 10.2, 30d supply, fill #0

## 2024-01-05 MED ORDER — FLUTICASONE PROPIONATE 50 MCG/ACT NA SUSP
2.0000 | Freq: Two times a day (BID) | NASAL | 5 refills | Status: AC
  Filled 2024-01-05: qty 16, 30d supply, fill #0
  Filled 2024-01-19: qty 16, 15d supply, fill #0
  Filled 2024-02-15: qty 16, 15d supply, fill #1

## 2024-01-05 MED ORDER — OLOPATADINE HCL 0.2 % OP SOLN
1.0000 [drp] | Freq: Every day | OPHTHALMIC | 5 refills | Status: AC
  Filled 2024-01-05: qty 2.5, 50d supply, fill #0
  Filled 2024-01-19: qty 2.5, 50d supply, fill #1
  Filled 2024-02-17: qty 2.5, 25d supply, fill #1

## 2024-01-06 ENCOUNTER — Other Ambulatory Visit: Payer: Self-pay

## 2024-01-07 ENCOUNTER — Other Ambulatory Visit: Payer: Self-pay

## 2024-01-09 ENCOUNTER — Other Ambulatory Visit: Payer: Self-pay

## 2024-01-10 ENCOUNTER — Other Ambulatory Visit: Payer: Self-pay

## 2024-01-10 MED ORDER — ALBUTEROL SULFATE HFA 108 (90 BASE) MCG/ACT IN AERS
2.0000 | INHALATION_SPRAY | Freq: Four times a day (QID) | RESPIRATORY_TRACT | 0 refills | Status: AC | PRN
  Filled 2024-01-10: qty 18, 16d supply, fill #0

## 2024-01-11 ENCOUNTER — Other Ambulatory Visit: Payer: Self-pay

## 2024-01-12 ENCOUNTER — Other Ambulatory Visit: Payer: Self-pay

## 2024-01-12 MED ORDER — DOXYCYCLINE HYCLATE 100 MG PO CAPS
100.0000 mg | ORAL_CAPSULE | Freq: Two times a day (BID) | ORAL | 0 refills | Status: AC
  Filled 2024-01-12: qty 14, 7d supply, fill #0

## 2024-01-13 ENCOUNTER — Other Ambulatory Visit: Payer: Self-pay

## 2024-01-17 ENCOUNTER — Other Ambulatory Visit: Payer: Self-pay

## 2024-01-19 ENCOUNTER — Other Ambulatory Visit: Payer: Self-pay

## 2024-01-20 ENCOUNTER — Other Ambulatory Visit: Payer: Self-pay

## 2024-01-21 ENCOUNTER — Other Ambulatory Visit: Payer: Self-pay

## 2024-01-23 ENCOUNTER — Other Ambulatory Visit: Payer: Self-pay

## 2024-01-23 ENCOUNTER — Other Ambulatory Visit: Payer: Self-pay | Admitting: Urology

## 2024-01-23 DIAGNOSIS — N3946 Mixed incontinence: Secondary | ICD-10-CM

## 2024-01-23 MED ORDER — MIRABEGRON ER 50 MG PO TB24
50.0000 mg | ORAL_TABLET | Freq: Every day | ORAL | 3 refills | Status: AC
  Filled 2024-01-23: qty 90, 90d supply, fill #0
  Filled 2024-02-15: qty 90, 90d supply, fill #1

## 2024-01-24 ENCOUNTER — Other Ambulatory Visit: Payer: Self-pay

## 2024-01-26 ENCOUNTER — Other Ambulatory Visit: Payer: Self-pay

## 2024-01-27 ENCOUNTER — Other Ambulatory Visit (HOSPITAL_COMMUNITY): Payer: Self-pay

## 2024-01-27 ENCOUNTER — Other Ambulatory Visit: Payer: Self-pay

## 2024-01-31 ENCOUNTER — Other Ambulatory Visit: Payer: Self-pay

## 2024-02-02 ENCOUNTER — Other Ambulatory Visit: Payer: Self-pay

## 2024-02-06 ENCOUNTER — Other Ambulatory Visit: Payer: Self-pay

## 2024-02-09 ENCOUNTER — Other Ambulatory Visit: Payer: Self-pay

## 2024-02-09 MED ORDER — OMEPRAZOLE 40 MG PO CPDR
40.0000 mg | DELAYED_RELEASE_CAPSULE | Freq: Every day | ORAL | 3 refills | Status: AC
  Filled 2024-02-09: qty 100, 100d supply, fill #0

## 2024-02-10 ENCOUNTER — Other Ambulatory Visit: Payer: Self-pay

## 2024-02-14 ENCOUNTER — Other Ambulatory Visit: Payer: Self-pay

## 2024-02-15 ENCOUNTER — Other Ambulatory Visit: Payer: Self-pay

## 2024-02-16 ENCOUNTER — Other Ambulatory Visit: Payer: Self-pay

## 2024-02-16 MED ORDER — PROMETHAZINE HCL 12.5 MG PO TABS
12.5000 mg | ORAL_TABLET | Freq: Three times a day (TID) | ORAL | 0 refills | Status: DC | PRN
  Filled 2024-02-16: qty 30, 10d supply, fill #0

## 2024-02-17 ENCOUNTER — Other Ambulatory Visit: Payer: Self-pay

## 2024-02-17 MED ORDER — PROMETHAZINE HCL 12.5 MG PO TABS
12.5000 mg | ORAL_TABLET | Freq: Three times a day (TID) | ORAL | 0 refills | Status: DC | PRN
  Filled 2024-02-17 – 2024-02-23 (×2): qty 30, 10d supply, fill #0

## 2024-02-18 ENCOUNTER — Other Ambulatory Visit: Payer: Self-pay

## 2024-02-23 ENCOUNTER — Other Ambulatory Visit: Payer: Self-pay

## 2024-02-24 ENCOUNTER — Other Ambulatory Visit: Payer: Self-pay

## 2024-02-24 MED ORDER — HYDROCODONE-ACETAMINOPHEN 10-325 MG PO TABS
1.0000 | ORAL_TABLET | ORAL | 0 refills | Status: AC | PRN
  Filled 2024-02-24: qty 150, 30d supply, fill #0

## 2024-02-25 ENCOUNTER — Other Ambulatory Visit: Payer: Self-pay

## 2024-02-25 MED ORDER — ESTRADIOL 0.01 % VA CREA
2.0000 g | TOPICAL_CREAM | VAGINAL | 11 refills | Status: AC
  Filled 2024-02-25: qty 42.5, 90d supply, fill #0

## 2024-03-02 ENCOUNTER — Other Ambulatory Visit: Payer: Self-pay

## 2024-03-02 MED ORDER — HYDROCODONE-ACETAMINOPHEN 10-325 MG PO TABS: 1.0000 | ORAL_TABLET | ORAL | 0 refills | Status: AC | PRN

## 2024-03-06 ENCOUNTER — Inpatient Hospital Stay

## 2024-03-06 ENCOUNTER — Inpatient Hospital Stay: Admitting: Oncology
